# Patient Record
Sex: Male | Born: 1945 | Race: White | Hispanic: No | Marital: Married | Smoking: Former smoker
Health system: Southern US, Community
[De-identification: ages and names within clinical notes are randomized; demographics above are authoritative.]

## PROBLEM LIST (undated history)

## (undated) DIAGNOSIS — I10 Essential (primary) hypertension: Secondary | ICD-10-CM

## (undated) DIAGNOSIS — M545 Low back pain, unspecified: Secondary | ICD-10-CM

## (undated) DIAGNOSIS — R079 Chest pain, unspecified: Secondary | ICD-10-CM

## (undated) DIAGNOSIS — E039 Hypothyroidism, unspecified: Secondary | ICD-10-CM

## (undated) DIAGNOSIS — Z87442 Personal history of urinary calculi: Secondary | ICD-10-CM

## (undated) DIAGNOSIS — I639 Cerebral infarction, unspecified: Secondary | ICD-10-CM

## (undated) DIAGNOSIS — E785 Hyperlipidemia, unspecified: Secondary | ICD-10-CM

## (undated) DIAGNOSIS — F329 Major depressive disorder, single episode, unspecified: Secondary | ICD-10-CM

## (undated) DIAGNOSIS — G8929 Other chronic pain: Secondary | ICD-10-CM

## (undated) DIAGNOSIS — F32A Depression, unspecified: Secondary | ICD-10-CM

## (undated) DIAGNOSIS — I2510 Atherosclerotic heart disease of native coronary artery without angina pectoris: Secondary | ICD-10-CM

## (undated) DIAGNOSIS — N200 Calculus of kidney: Secondary | ICD-10-CM

## (undated) DIAGNOSIS — I209 Angina pectoris, unspecified: Secondary | ICD-10-CM

## (undated) DIAGNOSIS — E119 Type 2 diabetes mellitus without complications: Secondary | ICD-10-CM

## (undated) DIAGNOSIS — M1990 Unspecified osteoarthritis, unspecified site: Secondary | ICD-10-CM

## (undated) HISTORY — DX: Angina pectoris, unspecified: I20.9

## (undated) HISTORY — PX: OTHER SURGICAL HISTORY: SHX169

## (undated) HISTORY — PX: HAND SURGERY: SHX662

## (undated) HISTORY — DX: Chest pain, unspecified: R07.9

## (undated) HISTORY — PX: SKIN CANCER EXCISION: SHX779

---

## 1999-07-15 HISTORY — PX: DUPUYTREN CONTRACTURE RELEASE: SHX1478

## 2004-02-27 ENCOUNTER — Emergency Department (HOSPITAL_COMMUNITY): Admission: EM | Admit: 2004-02-27 | Discharge: 2004-02-28 | Payer: Self-pay | Admitting: Emergency Medicine

## 2005-01-15 ENCOUNTER — Emergency Department (HOSPITAL_COMMUNITY): Admission: EM | Admit: 2005-01-15 | Discharge: 2005-01-15 | Payer: Self-pay | Admitting: *Deleted

## 2007-03-26 ENCOUNTER — Ambulatory Visit: Payer: Self-pay | Admitting: Internal Medicine

## 2007-03-26 ENCOUNTER — Encounter (INDEPENDENT_AMBULATORY_CARE_PROVIDER_SITE_OTHER): Payer: Self-pay | Admitting: Specialist

## 2007-03-26 ENCOUNTER — Ambulatory Visit (HOSPITAL_COMMUNITY): Admission: RE | Admit: 2007-03-26 | Discharge: 2007-03-26 | Payer: Self-pay | Admitting: Internal Medicine

## 2007-10-02 ENCOUNTER — Ambulatory Visit (HOSPITAL_BASED_OUTPATIENT_CLINIC_OR_DEPARTMENT_OTHER): Admission: RE | Admit: 2007-10-02 | Discharge: 2007-10-02 | Payer: Self-pay | Admitting: Orthopedic Surgery

## 2007-10-02 ENCOUNTER — Encounter (INDEPENDENT_AMBULATORY_CARE_PROVIDER_SITE_OTHER): Payer: Self-pay | Admitting: Orthopedic Surgery

## 2008-10-13 ENCOUNTER — Encounter (INDEPENDENT_AMBULATORY_CARE_PROVIDER_SITE_OTHER): Payer: Self-pay | Admitting: Orthopedic Surgery

## 2008-10-13 ENCOUNTER — Ambulatory Visit (HOSPITAL_BASED_OUTPATIENT_CLINIC_OR_DEPARTMENT_OTHER): Admission: RE | Admit: 2008-10-13 | Discharge: 2008-10-13 | Payer: Self-pay | Admitting: Orthopedic Surgery

## 2010-12-13 ENCOUNTER — Emergency Department (HOSPITAL_COMMUNITY)
Admission: EM | Admit: 2010-12-13 | Discharge: 2010-12-13 | Payer: Self-pay | Source: Home / Self Care | Admitting: Emergency Medicine

## 2010-12-13 LAB — BASIC METABOLIC PANEL
BUN: 10 mg/dL (ref 6–23)
CO2: 29 mEq/L (ref 19–32)
Chloride: 98 mEq/L (ref 96–112)
Creatinine, Ser: 0.99 mg/dL (ref 0.4–1.5)
GFR calc Af Amer: >60 mL/min (ref >60)
GFR calc non Af Amer: >60 mL/min (ref >60)

## 2010-12-13 LAB — HEPATIC FUNCTION PANEL
ALT: 17 U/L (ref 0–53)
AST: 20 U/L (ref 0–37)
Bilirubin, Direct: 0.2 mg/dL (ref 0.0–0.3)
Indirect Bilirubin: 0.5 mg/dL (ref 0.3–0.9)
Total Protein: 6.5 g/dL (ref 6.0–8.3)

## 2010-12-13 LAB — URINALYSIS, ROUTINE W REFLEX MICROSCOPIC
Leukocytes, UA: NEGATIVE
Nitrite: NEGATIVE
Specific Gravity, Urine: 1.025 (ref 1.005–1.030)
Urine Glucose, Fasting: >1000 mg/dL — AB
pH: 6.5 (ref 5.0–8.0)

## 2010-12-13 LAB — URINE MICROSCOPIC-ADD ON

## 2010-12-13 LAB — DIFFERENTIAL
Basophils Absolute: 0 10*3/uL (ref 0.0–0.1)
Basophils Relative: 0 % (ref 0–1)
Eosinophils Absolute: 0.5 10*3/uL (ref 0.0–0.7)
Monocytes Absolute: 0.8 10*3/uL (ref 0.1–1.0)
Neutro Abs: 8.7 10*3/uL — ABNORMAL HIGH (ref 1.7–7.7)

## 2010-12-13 LAB — CBC: Platelets: 196 10*3/uL (ref 150–400)

## 2010-12-14 ENCOUNTER — Ambulatory Visit: Admit: 2010-12-14 | Payer: Self-pay | Admitting: Internal Medicine

## 2010-12-14 ENCOUNTER — Encounter (INDEPENDENT_AMBULATORY_CARE_PROVIDER_SITE_OTHER): Payer: Self-pay | Admitting: Internal Medicine

## 2010-12-14 ENCOUNTER — Ambulatory Visit (HOSPITAL_COMMUNITY): Admission: RE | Admit: 2010-12-14 | Payer: MEDICARE | Source: Home / Self Care | Admitting: Internal Medicine

## 2010-12-20 ENCOUNTER — Other Ambulatory Visit (HOSPITAL_COMMUNITY): Payer: Self-pay | Admitting: Pulmonary Disease

## 2010-12-20 DIAGNOSIS — M545 Low back pain: Secondary | ICD-10-CM

## 2010-12-20 DIAGNOSIS — R93429 Abnormal radiologic findings on diagnostic imaging of unspecified kidney: Secondary | ICD-10-CM

## 2010-12-23 ENCOUNTER — Ambulatory Visit (HOSPITAL_COMMUNITY)
Admission: RE | Admit: 2010-12-23 | Discharge: 2010-12-23 | Disposition: A | Discharge status: Home / Self Care | Payer: MEDICARE | Source: Ambulatory Visit | Attending: Pulmonary Disease | Admitting: Pulmonary Disease

## 2010-12-23 DIAGNOSIS — N289 Disorder of kidney and ureter, unspecified: Secondary | ICD-10-CM | POA: Insufficient documentation

## 2010-12-23 DIAGNOSIS — M51379 Other intervertebral disc degeneration, lumbosacral region without mention of lumbar back pain or lower extremity pain: Secondary | ICD-10-CM | POA: Insufficient documentation

## 2010-12-23 DIAGNOSIS — Q619 Cystic kidney disease, unspecified: Secondary | ICD-10-CM | POA: Insufficient documentation

## 2010-12-23 DIAGNOSIS — K8020 Calculus of gallbladder without cholecystitis without obstruction: Secondary | ICD-10-CM | POA: Insufficient documentation

## 2010-12-23 DIAGNOSIS — M5126 Other intervertebral disc displacement, lumbar region: Secondary | ICD-10-CM | POA: Insufficient documentation

## 2010-12-23 DIAGNOSIS — M5137 Other intervertebral disc degeneration, lumbosacral region: Secondary | ICD-10-CM | POA: Insufficient documentation

## 2010-12-23 DIAGNOSIS — M545 Low back pain, unspecified: Secondary | ICD-10-CM | POA: Insufficient documentation

## 2010-12-23 DIAGNOSIS — R93429 Abnormal radiologic findings on diagnostic imaging of unspecified kidney: Secondary | ICD-10-CM

## 2010-12-23 MED ORDER — GADOBENATE DIMEGLUMINE 529 MG/ML IV SOLN
20.0000 mL | Freq: Once | INTRAVENOUS | Status: AC | PRN
  Administered 2010-12-23: 20 mL via INTRAVENOUS

## 2011-03-28 NOTE — Op Note (Signed)
NAMESHIVAN, HODES NO.:  [id number]   MEDICAL RECORD NO.:  [id number]          PATIENT TYPE:  AMB   LOCATION:  DSC                          FACILITY:  [facility]   PHYSICIAN:  Cindee Salt, M.D.       DATE OF BIRTH:  1946/01/25   DATE OF PROCEDURE:  10/02/2007  DATE OF DISCHARGE:                               OPERATIVE REPORT   ADDENDUM:  On reflection, I noted that I did not dictate the release of  the PIP joints to the middle, ring, and little fingers.  During the  operative procedure, prior to irrigation, the swallowtail ligaments were  incised after incision of the flexor sheath proximal to the A3 pulley.  This allowed of further extension but not complete extension of the PIP  joint of each of the fingers.  The ligaments proximally of the volar  plate were then released.  This allowed further extension of the finger  to approximately 15 degrees of full extension.  The accessory collateral  ligaments were released and this allowed complete extension of the PIP  joint of the middle, ring, and little fingers.  The remainder of the  dictation was as I dictated in the note.           ______________________________  Cindee Salt, M.D.     GK/MEDQ  D:  10/02/2007  T:  10/02/2007  Job:  [id number]

## 2011-03-28 NOTE — Op Note (Signed)
Hunter Sparks, Hunter Sparks              ACCOUNT NO.:  [id number]   MEDICAL RECORD NO.:  [id number]          PATIENT TYPE:  AMB   LOCATION:  DSC                          FACILITY:  [facility]   PHYSICIAN:  Cindee Salt, M.D.       DATE OF BIRTH:  [birth year]   DATE OF PROCEDURE:  10/02/2007  DATE OF DISCHARGE:                               OPERATIVE REPORT   PREOPERATIVE DIAGNOSIS:  Dupuytren's contracture, left thumb, index,  middle, ring and little fingers, with significant contractures PIP  joints, first web space.   POSTOPERATIVE DIAGNOSIS:  Dupuytren's contracture, left thumb, index,  middle, ring and little fingers, with significant contractures PIP  joints, first web space.   OPERATION:  Release/excision palmar fascia thumb, index, middle, ring,  and little fingers, first web space contracture release PIP joints,  middle ring and little fingers, with VY advancements.   ANESTHESIOLOGIST:  Bedelia Person, M.D.   ANESTHESIA:  Axillary block, general anesthesia.   In the preoperative area, the patient is seen, questions have been  encouraged and answered.  He is aware of risks and complications  including infection, recurrence of injury to arteries, nerves, tendons,  incomplete relief of symptoms, dystrophy, extension, stiffness of the  digits, loss of a finger.  He is desirous of proceeding to have this  done.  His questions have been encouraged and answered.  Antibiotic was  given.  The extremity was marked by both the patient and surgeon.   PROCEDURE:  The patient was brought to the operating room. After an  axillary block was given without difficulty, he was prepped and draped  using DuraPrep, supine position, left arm free.  LMA anesthesia was  given prior to the prep.  The limb was exsanguinated with an Esmarch  bandage, tourniquet placed high on the arm was inflated to 250 mmHg  after exsanguination only to the elbow.  The volar incisions were made  after marking these, these were for  VY advancements with the proximal  limb centered over the ring finger.  This was carried down through  subcutaneous tissue.  Bleeders were electrocauterized.  Neurovascular  structures were identified and protected.  The palmar fascia was lifted  off from the carpal retinaculum.  The superficial palmar arch digital  arteries and nerves were then identified.  These were traced distally.  The cord was followed out to the level of the middle phalanx, protecting  neurovascular bundles over their entire course.  This allowed this to be  excised en toto. Extensions to the right little and middle finger via  the natatory ligaments were also excised as were digital sheaths.   The little finger was attended to next.  A volar Brunner incision was  made.  This was connected beneath the skin elevating the flap.  Neurovascular structures identified.  The dissection carried distally.  The central cord was noted to proceed to the middle phalanx. On lifting  this, there was a secondary cord beneath the central cord coming from  the radial interosseous and proceeding to the level of the middle  phalanx.  This was removed.  A  third cord was present from the digiti  quinti.  This was removed along with the digital sheath protecting the  neurovascular structures.  This allowed the finger to be straightened  from approximately 70 degrees to approximately 30 degrees on both ring  and little fingers.  The middle finger was attended to next. The cord  was then elevated proximally.  A Brunner incision was made. A tunnel was  made beneath the skin protecting the neurovascular structures.  This was  then delivered distally and traced out to the level of the middle  phalanx.  Again, a secondary cord beneath the central cord was present  from the radial interosseous. This was excised.  This allowed extension  of the middle finger from 70 degrees to approximately 30 degrees of  flexion.  The cords were sent to  pathology.   A separate incision was then made on the index finger.  The cord was  then removed and sent to pathology.  A volar Brunner incision was made  over the thumb and carried down through subcutaneous tissue.  The cord  was removed from the thumb.  A transverse cord from the level of the  radial side of the metacarpal then proceeded across the palm into the  first web space and beneath the skin.  This was dissected free,  protecting neurovascular structures.  This was also sent to pathology.  The wounds were then copiously irrigated with saline.  The V's were  converted to Y's.  The area was then bathed in Celestone using 2 mL.  This was done after irrigation.  Vessel loop drains were placed to the  depths of each wound and brought out proximally.  The skin was then  loosely closed with interrupted 5-0 Vicryl Rapide sutures.  The  tourniquet deflated, all fingers immediately pinked.  A sterile  compressive dressing and splint was applied.  The patient tolerated the  procedure well. On deflation of the tourniquet, all fingers showed  normal circulation. Prior to closure, each neurovascular bundle was  explored over its entire length and found to be intact.  A sterile  compressive dressing and splint was applied.  The patient was taken to  the recovery room for observation in satisfactory condition.  He will be  discharged home to return to the [facility] [place] in one week  on Percocet.           ______________________________  Cindee Salt, M.D.     GK/MEDQ  D:  10/02/2007  T:  10/02/2007  Job:  [id number]

## 2011-03-28 NOTE — Op Note (Signed)
NAMEABNER, ARDIS NO.:  [id number]   MEDICAL RECORD NO.:  [id number]          PATIENT TYPE:  AMB   LOCATION:  DSC                          FACILITY:  [facility]   PHYSICIAN:  Cindee Salt, M.D.       DATE OF BIRTH:  09-Mar-1946   DATE OF PROCEDURE:  10/13/2008  DATE OF DISCHARGE:                               OPERATIVE REPORT   PREOPERATIVE DIAGNOSIS:  Dupuytren contracture, right index, middle,  ring, and little finger with proximal interphalangeal joint contractures  up to 85 degrees, little finger.   POSTOPERATIVE DIAGNOSIS:  Dupuytren contracture, right index, middle,  ring, and little finger with proximal interphalangeal joint contractures  up to 85 degrees, little finger.   OPERATION:  Excision palmar fascia with V-Y advancements, right index,  middle, ring, and little with release of proximal interphalangeal  joints, index and little, right hand.   SURGEON:  Cindee Salt, MD   ASSISTANT:  Carolyne Fiscal, RN   ANESTHESIA:  General.   ANESTHESIOLOGIST:  Burna Forts, MD   HISTORY:  The patient is a 65 year old male with a history of Dupuytren  contracture to both hands.  He has undergone release on his left side.  He is admitted now for release of his right.  He is aware of risks and  complications including infection; recurrence; injury to arteries,  nerves, and tendons; incomplete relief of symptoms; dystrophy;  stiffness; loss of mobility; loss of sensation.  He is desirous of  proceeding.  In the preoperative area, the patient is seen, the  extremity marked by both the patient and surgeon, antibiotic given.   PROCEDURE:  The patient was brought to the operating room where a  general anesthetic was carried out without difficulty under the  direction of Dr. Jacklynn Bue.  He was prepped using DuraPrep in supine  position with the right arm free.  A time-out was taken.  The limb was  exsanguinated with an Esmarch bandage.  Tourniquet placed high in the  arm  was inflated to 250 mmHg.  Volar Brunner incisions were marked out  on each finger.  Incisions were made, primarily starting on the little  finger proximally, carried distally.  The palmar fascia was localized at  the attachment to the carpal retinaculum.  This was followed distally.  Neurovascular structures were identified and protected to each of the  fingers.  The cord was then followed distally.  A cord from the abductor  digiti quinti was noted.  The flaps were created, taking care to protect  the neurovascular structures.  Natatory cords to the ring finger were  also identified.  The central cord was then removed to the level of the  middle phalanx.  No further extension of the PIP joint was noted.  The  flexor sheath was cut transversely.  This did not allow any significant  extension of his finger.  The PIP joint was then released, releasing the  swallowtails of the checkrein ligaments.  The accessory collateral  ligament and the joint came out fully straight.  The ring finger was  attended too.  Next, the cord  was identified proximally, traced distally  to the level of the proximal phalanx.  This was released allowing the  metacarpophalangeal joint to come fully straight.  Neurovascular  structures were protected throughout the procedure.  Natatory cord to  the middle finger was also released.  The middle finger was attended  too.  Next, the volar Brunner incision made, carried distally to the  level of the middle phalanx.  This allowed release of the entire finger  fully straining at the PIP joint without release of the joint.  The  natatory cord to the index was identified.  This was released from the  cord to the middle finger.  Lateral digital sheaths were removed  protecting neurovascular structures.  The index finger was approached  next through the volar Brunner incision.  This was based to the first  dorsal interosseous, which had a large cord.  The neurovascular   structures were identified and protected.  There was a transverse band  crossing over the central cord, this was released.  The PIP joint  remained flexed after removal of the cord out to the middle phalanx.  Taking care to protect the neurovascular structures, the PIP joint was  then released after transversely incising the flexor sheath and  releasing of the checkrein ligaments and accessory collateral.  The PIP  joint was fully straight.  The wounds were then copiously irrigated with  saline.  Vs converted to Ys on each of the limbs.  The wound was then  closed over drains with interrupted 5-0 Vicryl Rapide sutures.  The  tourniquet was deflated.  Bleeders were electrocauterized with bipolar  prior to closure.  All fingers immediately pinked.  A sterile  compressive dressing and splint was applied.  The patient tolerated the  procedure well and was taken to the recovery room for observation in  satisfactory condition.  He will be discharged home, to return to the  [facility] in 1 week, on Percocet.           ______________________________  Cindee Salt, M.D.     GK/MEDQ  D:  10/13/2008  T:  10/14/2008  Job:  [id number]   cc:   Ramon Dredge L. Juanetta Gosling, M.D.

## 2011-03-31 NOTE — Op Note (Signed)
Hunter Sparks, [place]              ACCOUNT NO.:  [id number]   MEDICAL RECORD NO.:  [id number]          PATIENT TYPE:  AMB   LOCATION:  DAY                           FACILITY:  [facility]   PHYSICIAN:  Lionel December, M.D.    DATE OF BIRTH:  [birth year]   DATE OF PROCEDURE:  03/26/2007  DATE OF DISCHARGE:                               OPERATIVE REPORT   PROCEDURE:  Colonoscopy with polypectomy.   INDICATIONS:  Bricen is a 65 year old Caucasian male who is here for  surveillance examination.  His last exam was in March 1997 with removal  of four polyps, however, three were hyperplastic and one was a small  tubular adenoma.  His brother was recently diagnosed with colon  carcinoma at age 11.  Procedure risks were reviewed the patient and  informed consent was obtained.   MEDS FOR CONSCIOUS SEDATION:  Demerol 50 mg IV, Versed 5 mg IV in  divided dose.   FINDINGS:  Procedure performed in endoscopy suite.  The patient's vital  signs and O2 sat were monitored during the procedure and remained  stable.  The patient was placed in left lateral position.  Rectal  examination performed.  He had soft sentinel skin tags.  Digital exam  was normal.  The Pentax videoscope was placed in the rectum and advanced  under vision into sigmoid colon beyond.  He had diffuse pigmentation  consistent with melanosis coli.  He had few tiny diverticula at sigmoid  colon.  Scope was passed into cecum which was identified by appendiceal  orifice and ileocecal valve.  This area was normal.  As the scope was  withdrawn colonic mucosa was carefully examined.  There was 6 mm polyp  at the ascending colon which was snared and most of the polyp during  this process was coagulated, but part of it was obtained for histologic  diagnosis.  There was another 3 mm polyp in this area which was  coagulated using snare.  Another 6 mm polyp was snared from sigmoid  colon.  Another smaller polyp in the vicinity was coagulated.  There  was  a 10 mm sessile bilobed polyp at rectum which was difficult to see in  spite of turning the patient on his back and on the right side.  This  polyp was snared.  Polypectomy was felt to be complete.  Scope was  retroflexed to examine anorectal junction which was unremarkable.  Endoscope was straightened and withdrawn.  The patient tolerated the  procedure well.   FINAL DIAGNOSIS:  1. Examination performed to cecum.  2. Three polyps snared; one 6 mm polyp from the ascending colon,      second same size from sigmoid colon and a 10 mm sessile polyp from      rectum.  3. Two small polyps were coagulated; one at sigmoid colon, another one      at the ascending colon.  4. Melanosis coli.  5. Few diverticula at sigmoid colon.   RECOMMENDATIONS:  Standard instructions given.  High-fiber diet.  I will  be contacting patient results of biopsy and further recommendations.  Lionel December, M.D.  Electronically Signed     NR/MEDQ  D:  03/26/2007  T:  03/26/2007  Job:  [id number]   cc:   Dr. Juanetta Gosling

## 2011-03-31 NOTE — Consult Note (Signed)
Hunter Sparks, Hunter Sparks NO.:  [id number]   MEDICAL RECORD NO.:  [id number]          PATIENT TYPE:  EMS   LOCATION:  ED                            FACILITY:  [facility]   PHYSICIAN:  Jefry H. Pollyann Kennedy, MD     DATE OF BIRTH:  October 26, 1946   DATE OF CONSULTATION:  DATE OF DISCHARGE:  01/15/2005                                   CONSULTATION   TIME:  0400.   REASON FOR CONSULTATION:  Possible peritonsillar abscess.   HISTORY:  This is a 65 year old gentleman who was transferred down from  [facility] for evaluation of sore throat and possible peritonsillar  abscess.  He has had about a one-week history of fever and several-day  history of severe right-sided sore throat.  He has some sort of antibiotic  at home from a previous condition that he started taking a couple of days  ago without relief.  He was evaluated this morning at [facility],  and a Strep test was performed, but I do not have the results.  His right  side has been quite sore.   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   MEDICATIONS:  None.   HABITS:  He quit smoking four years ago.   PRIMARY CARE PHYSICIAN:  Edward L. Juanetta Gosling, M.D.   PHYSICAL EXAMINATION:  GENERAL:  He is a healthy-appearing gentleman with  normal-sounding voice.  There is no respiratory distress.  NECK:  There is no palpable adenopathy in the neck.  Nasal exam is clear.  Oral cavity and pharynx are negative for trismus.  There is inflammation of  the tonsils, asymmetric, worse on the right.  There is slight fullness of  the soft palate on the right side, but no significant edema or erythema.   IMPRESSION:  Acute tonsillitis, possible peritonsillar abscess.   PLAN:  Attempt aspiration and drainage.   PROCEDURE:  The right soft palate was infiltrated with 1% Xylocaine with  epinephrine in multiple spots.  An 18-gauge needle was used to aspirate from  the  peritonsillar space on the right side.  Several spots were aspirated,  but there was no purulence obtained.   Acute tonsillitis, negative for peritonsillar abscess.  The plan was to  treat with antibiotics.  He is allergic to penicillin.  It causes a rash.  We will give him a dose of Levaquin here in the emergency department and  send him home with a prescription to follow up in two days in our office  either in [place] or in [place].  He is also instructed to drink lots  of fluids, take over-the-counter analgesics as needed.      JHR/MEDQ  D:  01/15/2005  T:  01/15/2005  Job:  [id number]   cc:   Ramon Dredge L. Juanetta Gosling, M.D.  [address]  [place]  [place] [address]  Fax: [phone]

## 2011-05-24 ENCOUNTER — Encounter (INDEPENDENT_AMBULATORY_CARE_PROVIDER_SITE_OTHER): Payer: MEDICARE | Admitting: Internal Medicine

## 2011-06-21 ENCOUNTER — Ambulatory Visit (HOSPITAL_COMMUNITY)
Admission: RE | Admit: 2011-06-21 | Discharge: 2011-06-21 | Disposition: A | Discharge status: Home / Self Care | Payer: Medicare Other | Source: Ambulatory Visit | Attending: Internal Medicine | Admitting: Internal Medicine

## 2011-06-21 ENCOUNTER — Encounter (INDEPENDENT_AMBULATORY_CARE_PROVIDER_SITE_OTHER): Payer: MEDICARE | Admitting: Internal Medicine

## 2011-06-21 ENCOUNTER — Other Ambulatory Visit (INDEPENDENT_AMBULATORY_CARE_PROVIDER_SITE_OTHER): Payer: Self-pay | Admitting: Internal Medicine

## 2011-06-21 ENCOUNTER — Encounter (HOSPITAL_COMMUNITY)
Admission: RE | Disposition: A | Discharge status: Home / Self Care | Payer: Self-pay | Source: Ambulatory Visit | Attending: Internal Medicine

## 2011-06-21 ENCOUNTER — Encounter (HOSPITAL_COMMUNITY): Payer: Self-pay | Admitting: *Deleted

## 2011-06-21 DIAGNOSIS — Z01812 Encounter for preprocedural laboratory examination: Secondary | ICD-10-CM | POA: Insufficient documentation

## 2011-06-21 DIAGNOSIS — Z860100 Personal history of colon polyps, unspecified: Secondary | ICD-10-CM | POA: Insufficient documentation

## 2011-06-21 DIAGNOSIS — Z09 Encounter for follow-up examination after completed treatment for conditions other than malignant neoplasm: Secondary | ICD-10-CM | POA: Insufficient documentation

## 2011-06-21 DIAGNOSIS — K5730 Diverticulosis of large intestine without perforation or abscess without bleeding: Secondary | ICD-10-CM

## 2011-06-21 DIAGNOSIS — D126 Benign neoplasm of colon, unspecified: Secondary | ICD-10-CM | POA: Insufficient documentation

## 2011-06-21 DIAGNOSIS — Z800 Family history of malignant neoplasm of digestive organs: Secondary | ICD-10-CM

## 2011-06-21 DIAGNOSIS — K644 Residual hemorrhoidal skin tags: Secondary | ICD-10-CM

## 2011-06-21 DIAGNOSIS — E119 Type 2 diabetes mellitus without complications: Secondary | ICD-10-CM | POA: Insufficient documentation

## 2011-06-21 HISTORY — PX: COLONOSCOPY: SHX5424

## 2011-06-21 HISTORY — DX: Depression, unspecified: F32.A

## 2011-06-21 HISTORY — DX: Major depressive disorder, single episode, unspecified: F32.9

## 2011-06-21 HISTORY — DX: Unspecified osteoarthritis, unspecified site: M19.90

## 2011-06-21 SURGERY — COLONOSCOPY
Anesthesia: Moderate Sedation

## 2011-06-21 MED ORDER — MEPERIDINE HCL 50 MG/ML IJ SOLN
INTRAMUSCULAR | Status: AC
  Filled 2011-06-21: qty 1

## 2011-06-21 MED ORDER — SODIUM CHLORIDE 0.45 % IV SOLN
Freq: Once | INTRAVENOUS | Status: AC
  Administered 2011-06-21: 09:00:00 via INTRAVENOUS

## 2011-06-21 MED ORDER — MIDAZOLAM HCL 5 MG/5ML IJ SOLN
INTRAMUSCULAR | Status: AC
  Filled 2011-06-21: qty 10

## 2011-06-21 MED ORDER — MEPERIDINE HCL 50 MG/ML IJ SOLN
INTRAMUSCULAR | Status: DC | PRN
  Administered 2011-06-21 (×2): 25 mg via INTRAVENOUS

## 2011-06-21 MED ORDER — MIDAZOLAM HCL 5 MG/5ML IJ SOLN
INTRAMUSCULAR | Status: DC | PRN
  Administered 2011-06-21 (×2): 2 mg via INTRAVENOUS

## 2011-06-21 NOTE — Op Note (Signed)
COLONOSCOPY PROCEDURE REPORT  PATIENT:  Hunter Sparks  MR#:  [id number] Birthdate:  [birth year], 65 y.o., male Endoscopist:  Dr. Malissa Hippo, MD Referred By:  Dr. Fredirick Maudlin. Procedure Date: 06/21/2011  Procedure:   Colonoscopy  Indications:  History of colonic polyps; he had 2 tubular adenomas, one tubulovillous adenoma snared in May 2008 2 smaller polyps were coagulated. Family history is negative for CRC.  Informed Consent:  Procedure and risks were reviewed with  the patient.  Questions have been answered and informed consent obtained  Medications:  Demerol 50 mg IV Versed 4 mg IV  Description of procedure:  After a digital rectal exam was performed, that colonoscope was advanced from the anus through the rectum and colon to the area of the cecum, ileocecal valve and appendiceal orifice. The cecum was deeply intubated. These structures were well-seen and photographed for the record. From the level of the cecum and ileocecal valve, the scope was slowly and cautiously withdrawn. The mucosal surfaces were carefully surveyed utilizing scope tip to flexion to facilitate fold flattening as needed. The scope was pulled down into the rectum where a thorough exam including retroflexion was performed.  Findings:   Prep excellent. Few scattered diverticula in sigmoid colon and mild pigmentation involving ignored and descending colon. 2 small polyps ablated via cold biopsy from distal transverse colon and submitted in one container. One polyp had erosion on the surface. 2 small polyps ablated via cold biopsy from the splenic  flexure and submitted in one container  Therapeutic/Diagnostic Maneuvers Performed:  See above  Complications:  None  Cecal Withdrawal Time:  14 minutes  Impression:  4 small polyps ablated via cold biopsy; 2 from distal transverse colon and 2 from splenic flexure. few diverticula at sigmoid colon. Mild changes of melanosis coli involving distal half of the  colon.  Recommendations:  Resume usual medications. High fiber diet. No driving for the next 82-NFAOZ. Physician will contact you with biopsy results.  REHMAN,NAJEEB U  06/21/2011 10:05 AM  CC: Dr. Fredirick Maudlin, MD

## 2011-06-21 NOTE — H&P (Signed)
Hunter Sparks is an 65 y.o. male.   Chief Complaint: History of colonic polyps. For colonoscopy HPI: Patient is 65 year old Caucasian male whose last colonoscopy was in May 2008 with removal of 2 adenomas and one tubulovillous adenoma. 2 smaller polyps were coagulated. He is here for surveillance colonoscopy. He denies abdominal pain rectal bleeding or change in his bowel habits. Family history is negative for CRC.  Past Medical History  Diagnosis Date  . Diabetes mellitus   . Anxiety   . Depression   . Arthritis     Past Surgical History  Procedure Date  . Hand surgery     bilateral    History reviewed. No pertinent family history. Social History:  reports that he quit smoking about 8 years ago. He does not have any smokeless tobacco history on file. He reports that he does not drink alcohol or use illicit drugs.  Allergies:  Allergies  Allergen Reactions  . Aspirin Itching  . Penicillins Rash    Medications Prior to Admission  Medication Dose Route Frequency Provider Last Rate Last Dose  . 0.45 % sodium chloride infusion   Intravenous Once Malissa Hippo, MD 20 mL/hr at 06/21/11 0859    . meperidine (DEMEROL) 50 MG/ML injection           . midazolam (VERSED) 5 MG/5ML injection            Medications Prior to Admission  Medication Sig Dispense Refill  . metFORMIN (GLUCOPHAGE) 500 MG tablet Take 500 mg by mouth 2 (two) times daily with a meal.        . traMADol (ULTRAM-ER) 100 MG 24 hr tablet Take 100 mg by mouth daily.          Results for orders placed during the hospital encounter of 06/21/11 (from the past 48 hour(s))  GLUCOSE, CAPILLARY     Status: Abnormal   Collection Time   06/21/11  8:37 AM      Component Value Range Comment   Glucose-Capillary 148 (*) 70 - 99 (mg/dL)    No results found.  Review of Systems  Constitutional: Negative for malaise/fatigue.  Gastrointestinal: Negative for heartburn, nausea, vomiting, abdominal pain, diarrhea, constipation,  blood in stool and melena.    Blood pressure 144/78, pulse 67, temperature 97.8 F (36.6 C), resp. rate 18, height 6' (1.829 m), weight 185 lb (83.915 kg), SpO2 95.00%. Physical Exam  Constitutional: He appears well-developed and well-nourished.  HENT:  Mouth/Throat: Oropharynx is clear and moist.  Eyes: Conjunctivae are normal. No scleral icterus.  Neck: Neck supple. No thyromegaly present.  Cardiovascular: Normal rate, regular rhythm and normal heart sounds.   No murmur heard. Respiratory: Breath sounds normal.  GI: Soft. He exhibits no distension and no mass. There is no tenderness. There is no rebound.  Musculoskeletal: He exhibits edema.  Lymphadenopathy:    He has no cervical adenopathy.  Neurological: He is alert.  Skin: Skin is warm and dry.     Assessment/Plan History of colonic polyps. for surveillance colonoscopy. Seizure and was reviewed with the patient and informed consent was obtained.  Hunter Sparks U 06/21/2011, 9:35 AM

## 2011-06-27 ENCOUNTER — Encounter (INDEPENDENT_AMBULATORY_CARE_PROVIDER_SITE_OTHER): Payer: Self-pay | Admitting: *Deleted

## 2011-06-28 ENCOUNTER — Encounter (HOSPITAL_COMMUNITY): Payer: Self-pay | Admitting: Internal Medicine

## 2011-08-15 LAB — BASIC METABOLIC PANEL
BUN: 8 mg/dL (ref 6–23)
CO2: 32 mEq/L (ref 19–32)
GFR calc non Af Amer: >60 mL/min (ref >60)
Glucose, Bld: 173 mg/dL — ABNORMAL HIGH (ref 70–99)
Potassium: 4.3 mEq/L (ref 3.5–5.1)
Sodium: 143 mEq/L (ref 135–145)

## 2011-08-18 LAB — GLUCOSE, CAPILLARY: Glucose-Capillary: 223 mg/dL — ABNORMAL HIGH (ref 70–99)

## 2011-08-22 LAB — BASIC METABOLIC PANEL
CO2: 30
Chloride: 100
Creatinine, Ser: 1.05
GFR calc Af Amer: >60
Potassium: 4.4

## 2011-08-22 LAB — POCT HEMOGLOBIN-HEMACUE
Hemoglobin: 15.4
Operator id: 112821

## 2012-12-12 ENCOUNTER — Emergency Department (HOSPITAL_COMMUNITY)
Admission: EM | Admit: 2012-12-12 | Discharge: 2012-12-12 | Disposition: A | Discharge status: Home / Self Care | Payer: Medicare Other | Attending: Emergency Medicine | Admitting: Emergency Medicine

## 2012-12-12 ENCOUNTER — Emergency Department (HOSPITAL_COMMUNITY): Payer: Medicare Other

## 2012-12-12 ENCOUNTER — Encounter (HOSPITAL_COMMUNITY): Payer: Self-pay | Admitting: *Deleted

## 2012-12-12 DIAGNOSIS — Z87891 Personal history of nicotine dependence: Secondary | ICD-10-CM | POA: Insufficient documentation

## 2012-12-12 DIAGNOSIS — R110 Nausea: Secondary | ICD-10-CM | POA: Insufficient documentation

## 2012-12-12 DIAGNOSIS — F411 Generalized anxiety disorder: Secondary | ICD-10-CM | POA: Insufficient documentation

## 2012-12-12 DIAGNOSIS — F329 Major depressive disorder, single episode, unspecified: Secondary | ICD-10-CM | POA: Insufficient documentation

## 2012-12-12 DIAGNOSIS — M129 Arthropathy, unspecified: Secondary | ICD-10-CM | POA: Insufficient documentation

## 2012-12-12 DIAGNOSIS — Z79899 Other long term (current) drug therapy: Secondary | ICD-10-CM | POA: Insufficient documentation

## 2012-12-12 DIAGNOSIS — F3289 Other specified depressive episodes: Secondary | ICD-10-CM | POA: Insufficient documentation

## 2012-12-12 DIAGNOSIS — N23 Unspecified renal colic: Secondary | ICD-10-CM

## 2012-12-12 DIAGNOSIS — Z87442 Personal history of urinary calculi: Secondary | ICD-10-CM | POA: Insufficient documentation

## 2012-12-12 DIAGNOSIS — E119 Type 2 diabetes mellitus without complications: Secondary | ICD-10-CM | POA: Insufficient documentation

## 2012-12-12 DIAGNOSIS — IMO0002 Reserved for concepts with insufficient information to code with codable children: Secondary | ICD-10-CM | POA: Insufficient documentation

## 2012-12-12 HISTORY — DX: Calculus of kidney: N20.0

## 2012-12-12 LAB — URINE MICROSCOPIC-ADD ON

## 2012-12-12 LAB — URINALYSIS, ROUTINE W REFLEX MICROSCOPIC
Bilirubin Urine: NEGATIVE
Glucose, UA: NEGATIVE mg/dL
Ketones, ur: NEGATIVE mg/dL
Leukocytes, UA: NEGATIVE
Nitrite: NEGATIVE
Specific Gravity, Urine: >1.03 — ABNORMAL HIGH (ref 1.005–1.030)
Urobilinogen, UA: 0.2 mg/dL (ref 0.0–1.0)
pH: 5.5 (ref 5.0–8.0)

## 2012-12-12 MED ORDER — KETOROLAC TROMETHAMINE 30 MG/ML IJ SOLN
INTRAMUSCULAR | Status: AC
  Administered 2012-12-12: 15 mg via INTRAVENOUS
  Filled 2012-12-12: qty 1

## 2012-12-12 MED ORDER — SODIUM CHLORIDE 0.9 % IV SOLN
INTRAVENOUS | Status: DC
  Administered 2012-12-12: 03:00:00 via INTRAVENOUS

## 2012-12-12 MED ORDER — HYDROMORPHONE HCL 4 MG PO TABS
2.0000 mg | ORAL_TABLET | ORAL | Status: DC | PRN
Start: 2012-12-12 — End: 2015-06-19

## 2012-12-12 MED ORDER — HYDROMORPHONE HCL PF 1 MG/ML IJ SOLN
1.0000 mg | Freq: Once | INTRAMUSCULAR | Status: AC
  Administered 2012-12-12: 1 mg via INTRAVENOUS
  Filled 2012-12-12: qty 1

## 2012-12-12 MED ORDER — TAMSULOSIN HCL 0.4 MG PO CAPS
ORAL_CAPSULE | ORAL | Status: AC
  Filled 2012-12-12: qty 1

## 2012-12-12 MED ORDER — ONDANSETRON HCL 4 MG/2ML IJ SOLN
4.0000 mg | Freq: Once | INTRAMUSCULAR | Status: AC
  Administered 2012-12-12: 4 mg via INTRAVENOUS
  Filled 2012-12-12: qty 2

## 2012-12-12 MED ORDER — KETOROLAC TROMETHAMINE 15 MG/ML IJ SOLN
15.0000 mg | Freq: Once | INTRAMUSCULAR | Status: DC
  Filled 2012-12-12: qty 1

## 2012-12-12 MED ORDER — TAMSULOSIN HCL 0.4 MG PO CAPS: ORAL_CAPSULE | ORAL | Status: DC

## 2012-12-12 MED ORDER — TAMSULOSIN HCL 0.4 MG PO CAPS
0.4000 mg | ORAL_CAPSULE | Freq: Once | ORAL | Status: AC
  Administered 2012-12-12: 0.4 mg via ORAL
  Filled 2012-12-12: qty 1

## 2012-12-12 MED ORDER — NAPROXEN SODIUM 220 MG PO TABS: ORAL_TABLET | ORAL | Status: DC

## 2012-12-12 NOTE — ED Provider Notes (Signed)
History     CSN: [id number]  Arrival date & time 12/12/12  0301   First MD Initiated Contact with Patient 12/12/12 0309      Chief Complaint  Patient presents with  . Flank Pain    (Consider location/radiation/quality/duration/timing/severity/associated sxs/prior treatment) HPI Level V caveat: Severe pain. This is a 67 year old male who awoke just prior to arrival with severe pain in the right flank. The pain is like that of prior kidney stones. It is been associated with nausea but no vomiting. He is very agitated. He has been given nothing to treat the pain. His wife drove him over here immediately.  Past Medical History  Diagnosis Date  . Diabetes mellitus   . Anxiety   . Depression   . Arthritis     Past Surgical History  Procedure Date  . Hand surgery     bilateral  . Colonoscopy 06/21/2011    Procedure: COLONOSCOPY;  Surgeon: Malissa Hippo, MD;  Location: [facility];  Service: Endoscopy;  Laterality: N/A;    No family history on file.  History  Substance Use Topics  . Smoking status: Former Smoker    Quit date: 06/21/2003  . Smokeless tobacco: Not on file  . Alcohol Use: No      Review of Systems  Unable to perform ROS   Allergies  Aspirin and Penicillins  Home Medications   Current Outpatient Rx  Name  Route  Sig  Dispense  Refill  . LEVOTHYROXINE SODIUM 75 MCG PO TABS   Oral   Take 75 mcg by mouth daily.         Marland Kitchen METFORMIN HCL 500 MG PO TABS   Oral   Take 500 mg by mouth 2 (two) times daily with a meal.           . TRAMADOL HCL ER 100 MG PO TB24   Oral   Take 100 mg by mouth daily.             BP 166/81  Pulse 68  Temp 97.9 F (36.6 C) (Oral)  Resp 20  Ht 6' (1.829 m)  Wt 195 lb (88.451 kg)  BMI 26.45 kg/m2  SpO2 100%  Physical Exam General: Well-developed, well-nourished male in obvious discomfort; appearance consistent with age of record HENT: normocephalic, atraumatic Eyes: pupils equal round and reactive to  light; extraocular muscles intact Neck: supple Heart: regular rate and rhythm Lungs: clear to auscultation bilaterally Abdomen: soft; nondistended; nontender GU: Right CVA tenderness Extremities: No deformity; full range of motion Neurologic: Awake, alert; motor function intact in all extremities and symmetric; no facial droop Skin: Warm and dry Psychiatric: agitated     ED Course  Procedures (including critical care time)   MDM   Nursing notes and vitals signs, including pulse oximetry, reviewed.  Summary of this visit's results, reviewed by myself:  Labs:  Results for orders placed during the hospital encounter of 12/12/12 (from the past 24 hour(s))  URINALYSIS, ROUTINE W REFLEX MICROSCOPIC     Status: Abnormal   Collection Time   12/12/12  4:10 AM      Component Value Range   Color, Urine YELLOW  YELLOW   APPearance CLEAR  CLEAR   Specific Gravity, Urine >1.030 (*) 1.005 - 1.030   pH 5.5  5.0 - 8.0   Glucose, UA NEGATIVE  NEGATIVE mg/dL   Hgb urine dipstick LARGE (*) NEGATIVE   Bilirubin Urine NEGATIVE  NEGATIVE   Ketones, ur NEGATIVE  NEGATIVE  mg/dL   Protein, ur TRACE (*) NEGATIVE mg/dL   Urobilinogen, UA 0.2  0.0 - 1.0 mg/dL   Nitrite NEGATIVE  NEGATIVE   Leukocytes, UA NEGATIVE  NEGATIVE  URINE MICROSCOPIC-ADD ON     Status: Abnormal   Collection Time   12/12/12  4:10 AM      Component Value Range   Squamous Epithelial / LPF RARE  RARE   WBC, UA 0-2  <3 WBC/hpf   RBC / HPF TOO NUMEROUS TO COUNT  <3 RBC/hpf   Bacteria, UA FEW (*) RARE    Imaging Studies: Ct Abdomen Pelvis Wo Contrast  12/12/2012  *RADIOLOGY REPORT*  Clinical Data: Right flank pain  CT ABDOMEN AND PELVIS WITHOUT CONTRAST  Technique:  Multidetector CT imaging of the abdomen and pelvis was performed following the standard protocol without intravenous contrast.  Comparison: 12/13/2010 CT, 12/23/2010 MRI  Findings: Linear opacity within the middle lobe and lingula, unchanged, most in keeping with  scarring.  Normal heart size.  Organ abnormality/lesion detection is limited in the absence of intravenous contrast. Within this limitation, unremarkable liver, spleen, pancreas.  Gallstones.  No biliary ductal dilatation.  Bilateral adrenal nodularity without a dominant/measurable nodule, similar to prior.  Mild right hydroureteronephrosis to the level of a 2 mm right UVJ stone. There may be a tiny nonobstructing upper pole stone on the right.  The previously noted hemorrhagic cyst on the right now appears water attenuation.  The hemorrhagic cyst on the left is no longer visualized.  Colonic diverticulosis.  No CT evidence for colitis or diverticulitis.  Normal appendix.  No bowel obstruction.  No free intraperitoneal air or fluid.  Fat containing anterior abdominal wall hernia, similar to prior, supraumbilical.  No lymphadenopathy.  Normal caliber aorta and branch vessels with scattered atherosclerotic disease.  Partially decompressed bladder.  Multilevel degenerative changes of the imaged spine. No acute or aggressive appearing osseous lesion. Mild T12 compression deformity is similar to prior.  Metallic clips within the right rectus abdominus musculature, unchanged.  IMPRESSION: Mild right hydroureteronephrosis to the level of a 2 mm right UVJ stone.  Gallstones.   Original Report Authenticated By: Jearld Lesch, M.D.       4:01 AM Pain significantly improved with IV medications. He has been unable to void. Flomax ordered.  4:46 AM Patient states he is ready to go home.         Hanley Seamen, MD 12/12/12 [id number]

## 2012-12-12 NOTE — ED Notes (Signed)
Pt reporting pain in right flank, as well as nausea and vomiting.  Reporting pain woke him up.  Reports history of kidney stone, and states pain feels the same.

## 2012-12-12 NOTE — ED Notes (Signed)
Unable to void at present - aware of need for urine specimen

## 2014-02-24 ENCOUNTER — Other Ambulatory Visit (HOSPITAL_COMMUNITY): Payer: Self-pay | Admitting: Interventional Radiology

## 2014-02-24 DIAGNOSIS — M549 Dorsalgia, unspecified: Secondary | ICD-10-CM

## 2014-02-25 ENCOUNTER — Ambulatory Visit (HOSPITAL_COMMUNITY)
Admission: RE | Admit: 2014-02-25 | Discharge: 2014-02-25 | Disposition: A | Discharge status: Home / Self Care | Payer: Medicare Other | Source: Ambulatory Visit | Attending: Interventional Radiology | Admitting: Interventional Radiology

## 2014-02-25 ENCOUNTER — Other Ambulatory Visit (HOSPITAL_COMMUNITY): Payer: Self-pay | Admitting: Interventional Radiology

## 2014-02-25 DIAGNOSIS — M545 Low back pain, unspecified: Secondary | ICD-10-CM

## 2014-02-25 DIAGNOSIS — M48061 Spinal stenosis, lumbar region without neurogenic claudication: Secondary | ICD-10-CM | POA: Insufficient documentation

## 2014-02-25 DIAGNOSIS — M549 Dorsalgia, unspecified: Secondary | ICD-10-CM

## 2014-02-26 ENCOUNTER — Telehealth (HOSPITAL_COMMUNITY): Payer: Self-pay | Admitting: Interventional Radiology

## 2014-02-26 NOTE — Telephone Encounter (Signed)
Called pt, spoke to pt's wife. Told her that Deveshwar said there were no fractures on the pt's MRI but that he may benefit from epidural injections. I spoke to Dr. Vernard Gambles and he told me to tell the patient to contact their office at [facility] to schedule injections. Pt's wife stated understanding and is in agreement with this plan of care. JM

## 2014-03-23 ENCOUNTER — Other Ambulatory Visit: Payer: Self-pay | Admitting: Pulmonary Disease

## 2014-03-23 DIAGNOSIS — M47817 Spondylosis without myelopathy or radiculopathy, lumbosacral region: Secondary | ICD-10-CM

## 2014-03-25 ENCOUNTER — Other Ambulatory Visit: Payer: Self-pay | Admitting: Pulmonary Disease

## 2014-03-25 ENCOUNTER — Ambulatory Visit
Admission: RE | Admit: 2014-03-25 | Discharge: 2014-03-25 | Disposition: A | Discharge status: Home / Self Care | Payer: Medicare Other | Source: Ambulatory Visit | Attending: Pulmonary Disease | Admitting: Pulmonary Disease

## 2014-03-25 VITALS — BP 160/87 | HR 70

## 2014-03-25 DIAGNOSIS — M47817 Spondylosis without myelopathy or radiculopathy, lumbosacral region: Secondary | ICD-10-CM

## 2014-03-25 MED ORDER — METHYLPREDNISOLONE ACETATE 40 MG/ML INJ SUSP (RADIOLOG: 120.0000 mg | Freq: Once | INTRAMUSCULAR | Status: AC

## 2014-03-25 MED ORDER — IOHEXOL 180 MG/ML  SOLN
1.0000 mL | Freq: Once | INTRAMUSCULAR | Status: AC | PRN
  Administered 2014-03-25: 1 mL via INTRA_ARTICULAR

## 2014-03-25 MED ORDER — METHYLPREDNISOLONE ACETATE 40 MG/ML INJ SUSP (RADIOLOG
80.0000 mg | Freq: Once | INTRAMUSCULAR | Status: AC
  Administered 2014-03-25: 80 mg via INTRA_ARTICULAR

## 2014-03-25 MED ORDER — IOHEXOL 180 MG/ML  SOLN: 1.0000 mL | Freq: Once | INTRAMUSCULAR | Status: AC | PRN

## 2014-03-25 NOTE — Discharge Instructions (Signed)

## 2014-07-29 ENCOUNTER — Other Ambulatory Visit: Payer: Self-pay | Admitting: Pulmonary Disease

## 2014-07-29 DIAGNOSIS — M47817 Spondylosis without myelopathy or radiculopathy, lumbosacral region: Secondary | ICD-10-CM

## 2014-08-03 ENCOUNTER — Ambulatory Visit
Admission: RE | Admit: 2014-08-03 | Discharge: 2014-08-03 | Disposition: A | Discharge status: Home / Self Care | Payer: Medicare Other | Source: Ambulatory Visit | Attending: Pulmonary Disease | Admitting: Pulmonary Disease

## 2014-08-03 ENCOUNTER — Other Ambulatory Visit: Payer: Self-pay | Admitting: Pulmonary Disease

## 2014-08-03 VITALS — BP 163/90 | HR 70

## 2014-08-03 DIAGNOSIS — M47817 Spondylosis without myelopathy or radiculopathy, lumbosacral region: Secondary | ICD-10-CM

## 2014-08-03 MED ORDER — IOHEXOL 180 MG/ML  SOLN
1.0000 mL | Freq: Once | INTRAMUSCULAR | Status: AC | PRN
  Administered 2014-08-03: 1 mL via INTRA_ARTICULAR

## 2014-08-03 MED ORDER — METHYLPREDNISOLONE ACETATE 40 MG/ML INJ SUSP (RADIOLOG
120.0000 mg | Freq: Once | INTRAMUSCULAR | Status: AC
  Administered 2014-08-03: 120 mg via INTRA_ARTICULAR

## 2014-12-04 ENCOUNTER — Ambulatory Visit: Payer: Medicare Other | Admitting: Nutrition

## 2014-12-28 ENCOUNTER — Encounter: Payer: Medicare Other | Admitting: Nutrition

## 2015-01-05 DIAGNOSIS — E782 Mixed hyperlipidemia: Secondary | ICD-10-CM | POA: Diagnosis not present

## 2015-01-05 DIAGNOSIS — E1165 Type 2 diabetes mellitus with hyperglycemia: Secondary | ICD-10-CM | POA: Diagnosis not present

## 2015-01-05 DIAGNOSIS — E559 Vitamin D deficiency, unspecified: Secondary | ICD-10-CM | POA: Diagnosis not present

## 2015-01-05 DIAGNOSIS — I10 Essential (primary) hypertension: Secondary | ICD-10-CM | POA: Diagnosis not present

## 2015-01-11 DIAGNOSIS — E1165 Type 2 diabetes mellitus with hyperglycemia: Secondary | ICD-10-CM | POA: Diagnosis not present

## 2015-01-11 DIAGNOSIS — E782 Mixed hyperlipidemia: Secondary | ICD-10-CM | POA: Diagnosis not present

## 2015-01-11 DIAGNOSIS — E559 Vitamin D deficiency, unspecified: Secondary | ICD-10-CM | POA: Diagnosis not present

## 2015-01-11 DIAGNOSIS — I10 Essential (primary) hypertension: Secondary | ICD-10-CM | POA: Diagnosis not present

## 2015-01-27 ENCOUNTER — Other Ambulatory Visit: Payer: Self-pay | Admitting: Pulmonary Disease

## 2015-01-27 DIAGNOSIS — M545 Low back pain: Secondary | ICD-10-CM

## 2015-01-29 ENCOUNTER — Ambulatory Visit
Admission: RE | Admit: 2015-01-29 | Discharge: 2015-01-29 | Disposition: A | Discharge status: Home / Self Care | Payer: Medicare Other | Source: Ambulatory Visit | Attending: Pulmonary Disease | Admitting: Pulmonary Disease

## 2015-01-29 ENCOUNTER — Other Ambulatory Visit: Payer: Self-pay | Admitting: Pulmonary Disease

## 2015-01-29 DIAGNOSIS — M545 Low back pain: Secondary | ICD-10-CM | POA: Diagnosis not present

## 2015-01-29 MED ORDER — IOHEXOL 180 MG/ML  SOLN
1.0000 mL | Freq: Once | INTRAMUSCULAR | Status: AC | PRN
  Administered 2015-01-29: 1 mL via EPIDURAL

## 2015-01-29 MED ORDER — METHYLPREDNISOLONE ACETATE 40 MG/ML INJ SUSP (RADIOLOG
120.0000 mg | Freq: Once | INTRAMUSCULAR | Status: AC
  Administered 2015-01-29: 120 mg via EPIDURAL

## 2015-01-29 NOTE — Discharge Instructions (Addendum)

## 2015-02-12 ENCOUNTER — Encounter: Payer: Self-pay | Admitting: Nutrition

## 2015-02-12 ENCOUNTER — Encounter: Payer: Medicare Other | Attending: "Endocrinology | Admitting: Nutrition

## 2015-02-12 VITALS — Ht 72.0 in | Wt 182.0 lb

## 2015-02-12 DIAGNOSIS — E118 Type 2 diabetes mellitus with unspecified complications: Secondary | ICD-10-CM | POA: Insufficient documentation

## 2015-02-12 DIAGNOSIS — Z713 Dietary counseling and surveillance: Secondary | ICD-10-CM | POA: Insufficient documentation

## 2015-02-12 DIAGNOSIS — IMO0002 Reserved for concepts with insufficient information to code with codable children: Secondary | ICD-10-CM

## 2015-02-12 DIAGNOSIS — E1165 Type 2 diabetes mellitus with hyperglycemia: Secondary | ICD-10-CM

## 2015-02-12 NOTE — Progress Notes (Signed)
  Medical Nutrition Therapy:  Appt start time: 0930 end time:  1030.  Assessment:  Primary concerns today: Diabetes. A1C 8.8%. He is here with his wife today. Has significant back problems. Injections aren't helping much he notes.  His wife  does the cooking and shopping. Eats a lot of fried foods from time to time.Skips lunch often. Doesn't drink a lot of water but willing to start drinking more. Unable to exercise much due to back problems. In a lot of pain today. Typically eats a little breakfast and a big supper, which is most likely the culprit for his A1C of 8.8%. Occasionally eats snacks and has sweets from time to time.   Diet is inconsistent in CHO and balanced meals for maximum DM control. Can't tolerate Metformin. Not on any DM meds at present.  Preferred Learning Style:   Auditory  Visual  Hands on  Learning Readiness:    Ready  Change in progress   MEDICATIONS: See list   DIETARY INTAKE:  24-hr recall:  B ( AM): Sausage, egg, toast and grils   Snk ( AM):   L ( PM): skips Snk ( PM): nabs or misc. D ( PM): Meat, vegetables. LIkes a lot of potatoes, corn, peas and beans. Unsweet tea Snk ( PM): popcorn Beverages: Water, Unsweet tea or diet soda   Usual physical activity: very limited due to back pain.  Estimated energy needs: 1800 calories 200 g carbohydrates 135 g protein 50 g fat  Progress Towards Goal(s):  In progress.   Nutritional Diagnosis:  NB-1.1 Food and nutrition-related knowledge deficit As related to Diabetes.  As evidenced by A1C 8.8%..    Intervention:  Nutrition Counseling and diabetes education done on diet, meal planning, disease, complications of DM, target ranges for blood sugars,  Plan: 1. Follow My Plate as instructed trying to get about 45 g CHO per meals. 2. Avoid skipping meals, especially lunch. 3. Increase water to 4-5 bottles per day. 4. Increase fresh fruits and low carb vegetables. 5. Try to get FBS less than 130 and after  meals less than 180 mg/dl. 6.Bring blood sugars log to next visit. 7. Get A1C down to 7% in three months.  Teaching Method Utilized:  Visual Auditory Hands on  Handouts given during visit include: The Plate Method Carb Counting and Food Label handouts Meal Plan Card  Barriers to learning/adherence to lifestyle change: none  Demonstrated degree of understanding via:  Teach Back   Monitoring/Evaluation:  Dietary intake, exercise, meal planning, SBG, and body weight in 1 month(s).

## 2015-02-12 NOTE — Patient Instructions (Signed)
Plan: 1. Follow My Plate as instructed trying to get about 45 g CHO per meals. 2. Avoid skipping meals, especially lunch. 3. Increase water to 4-5 bottles per day. 4. Increase fresh fruits and low carb vegetables. 5. Try to get FBS less than 130 and after meals less than 180 mg/dl. 6.Bring blood sugars log to next visit. 7. Get A1C down to 7% in three months.

## 2015-04-01 DIAGNOSIS — E119 Type 2 diabetes mellitus without complications: Secondary | ICD-10-CM | POA: Diagnosis not present

## 2015-04-01 DIAGNOSIS — M545 Low back pain: Secondary | ICD-10-CM | POA: Diagnosis not present

## 2015-04-09 DIAGNOSIS — E039 Hypothyroidism, unspecified: Secondary | ICD-10-CM | POA: Diagnosis not present

## 2015-04-09 DIAGNOSIS — E1165 Type 2 diabetes mellitus with hyperglycemia: Secondary | ICD-10-CM | POA: Diagnosis not present

## 2015-04-09 DIAGNOSIS — E782 Mixed hyperlipidemia: Secondary | ICD-10-CM | POA: Diagnosis not present

## 2015-04-09 DIAGNOSIS — I10 Essential (primary) hypertension: Secondary | ICD-10-CM | POA: Diagnosis not present

## 2015-04-19 ENCOUNTER — Encounter: Payer: Medicare Other | Attending: "Endocrinology | Admitting: Nutrition

## 2015-04-19 VITALS — Ht 72.0 in | Wt 187.0 lb

## 2015-04-19 DIAGNOSIS — E1165 Type 2 diabetes mellitus with hyperglycemia: Secondary | ICD-10-CM

## 2015-04-19 DIAGNOSIS — Z713 Dietary counseling and surveillance: Secondary | ICD-10-CM | POA: Diagnosis not present

## 2015-04-19 DIAGNOSIS — E118 Type 2 diabetes mellitus with unspecified complications: Secondary | ICD-10-CM | POA: Insufficient documentation

## 2015-04-19 DIAGNOSIS — IMO0002 Reserved for concepts with insufficient information to code with codable children: Secondary | ICD-10-CM

## 2015-04-19 DIAGNOSIS — I10 Essential (primary) hypertension: Secondary | ICD-10-CM | POA: Diagnosis not present

## 2015-04-19 DIAGNOSIS — E559 Vitamin D deficiency, unspecified: Secondary | ICD-10-CM | POA: Diagnosis not present

## 2015-04-19 DIAGNOSIS — E038 Other specified hypothyroidism: Secondary | ICD-10-CM | POA: Diagnosis not present

## 2015-04-19 DIAGNOSIS — E782 Mixed hyperlipidemia: Secondary | ICD-10-CM | POA: Diagnosis not present

## 2015-04-19 NOTE — Patient Instructions (Signed)
Plan: 1. Follow My Plate as instructed trying to get about 45 g CHO per meals. 2. Avoid skipping meals 3. Increase water to 4-5 bottles per day. 4. Increase low carb vegetables- 2 servings with lunch and dinner. 5. Try to get FBS less than 130 and after meals less than 180 mg/dl. 6.Bring blood sugars log to next visit. 7. Get A1C down to 7% in three months. 8. Take Janumet as prescribed.

## 2015-04-19 NOTE — Progress Notes (Signed)
  Medical Nutrition Therapy:  Appt start time: 1100 end time:  1130.  Assessment:  Primary concerns today: Diabetes follow up. A1C 8.2%. He is here with his wife. CHanges made: Trying to eat three meals per day. Still doesn't have an appetite at times from pain meds. Sometimes skips breakfast and only eats lunch and dinner or skips lunch from late breakfast.. Trying to walk some for exercise. Feels better. Trying to eat more fresh fruits and vegetables and whole grains. Most recent A1C 8.2% still. Hasn't changed since last visit. Has been eating more jelly with breakfast. Saw Dr. Dorris Fetch today. Going to start on Janumet to see if that will help improve his blood sugar control.   Diet is still excessive in CHO at some meals. Needs more low carb vegetables;2 per lunch and dinner and needs more consistency of eating three better balanced meals.  Preferred Learning Style:   Auditory  Visual  Hands on  Learning Readiness:    Ready  Change in progress  MEDICATIONS: See list   DIETARY INTAKE:  24-hr recall:  B ( AM): Sausage, egg, toast and grils   Snk ( AM):   L ( PM): PB and honey sandwich OR tunafish sandwich and crystal light Snk ( PM):  D ( PM): 2 hot dogs with baked beans, crystal light 8-9 tator tots Snk ( PM): none Beverages: Water, Unsweet tea or diet soda   Usual physical activity: very limited due to back pain.  Estimated energy needs: 1800 calories 200 g carbohydrates 135 g protein 50 g fat  Progress Towards Goal(s):  In progress.   Nutritional Diagnosis:  NB-1.1 Food and nutrition-related knowledge deficit As related to Diabetes.  As evidenced by A1C 8.8%..    Intervention:  Nutrition Counseling and diabetes education done on diet, meal planning, disease, complications of DM, target ranges for blood sugars. Reviewed meal planning and CHO counting and MY Plate.  Plan: 1. Follow My Plate as instructed trying to get about 45 g CHO per meals. 2. Avoid skipping  meals 3. Increase water to 4-5 bottles per day. 4. Increase low carb vegetables- 2 servings with lunch and dinner. 5. Try to get FBS less than 130 and after meals less than 180 mg/dl. 6.Bring blood sugars log to next visit. 7. Get A1C down to 7% in three months. 8. Take Janumet as prescribed.  Teaching Method Utilized:  Visual Auditory Hands on  Handouts given during visit include: The Plate Method Diabetes Instructions.  Barriers to learning/adherence to lifestyle change: none  Demonstrated degree of understanding via:  Teach Back   Monitoring/Evaluation:  Dietary intake, exercise, meal planning, SBG, and body weight in 3 month(s).

## 2015-05-10 ENCOUNTER — Other Ambulatory Visit: Payer: Self-pay

## 2015-05-14 ENCOUNTER — Other Ambulatory Visit: Payer: Self-pay | Admitting: Pulmonary Disease

## 2015-05-14 DIAGNOSIS — G8929 Other chronic pain: Secondary | ICD-10-CM

## 2015-05-14 DIAGNOSIS — M545 Low back pain: Principal | ICD-10-CM

## 2015-05-18 DIAGNOSIS — Z0000 Encounter for general adult medical examination without abnormal findings: Secondary | ICD-10-CM | POA: Diagnosis not present

## 2015-05-19 ENCOUNTER — Ambulatory Visit
Admission: RE | Admit: 2015-05-19 | Discharge: 2015-05-19 | Disposition: A | Discharge status: Home / Self Care | Payer: Medicare Other | Source: Ambulatory Visit | Attending: Pulmonary Disease | Admitting: Pulmonary Disease

## 2015-05-19 ENCOUNTER — Other Ambulatory Visit: Payer: Self-pay | Admitting: Pulmonary Disease

## 2015-05-19 VITALS — BP 151/72 | HR 80

## 2015-05-19 DIAGNOSIS — M545 Low back pain, unspecified: Secondary | ICD-10-CM

## 2015-05-19 DIAGNOSIS — G8929 Other chronic pain: Secondary | ICD-10-CM

## 2015-05-19 DIAGNOSIS — M5137 Other intervertebral disc degeneration, lumbosacral region: Secondary | ICD-10-CM

## 2015-05-19 MED ORDER — IOHEXOL 180 MG/ML  SOLN
1.0000 mL | Freq: Once | INTRAMUSCULAR | Status: AC | PRN
  Administered 2015-05-19: 1 mL via INTRA_ARTICULAR

## 2015-05-19 MED ORDER — IOHEXOL 180 MG/ML  SOLN: 1.0000 mL | Freq: Once | INTRAMUSCULAR | Status: DC | PRN

## 2015-05-19 MED ORDER — METHYLPREDNISOLONE ACETATE 40 MG/ML INJ SUSP (RADIOLOG
120.0000 mg | Freq: Once | INTRAMUSCULAR | Status: AC
  Administered 2015-05-19: 120 mg via INTRA_ARTICULAR

## 2015-05-19 MED ORDER — METHYLPREDNISOLONE ACETATE 40 MG/ML INJ SUSP (RADIOLOG: 120.0000 mg | Freq: Once | INTRAMUSCULAR | Status: DC

## 2015-06-01 DIAGNOSIS — E119 Type 2 diabetes mellitus without complications: Secondary | ICD-10-CM | POA: Diagnosis not present

## 2015-06-01 DIAGNOSIS — H52203 Unspecified astigmatism, bilateral: Secondary | ICD-10-CM | POA: Diagnosis not present

## 2015-06-01 DIAGNOSIS — H524 Presbyopia: Secondary | ICD-10-CM | POA: Diagnosis not present

## 2015-06-01 DIAGNOSIS — H53022 Refractive amblyopia, left eye: Secondary | ICD-10-CM | POA: Diagnosis not present

## 2015-06-10 ENCOUNTER — Inpatient Hospital Stay (HOSPITAL_COMMUNITY)
Admission: EM | Admit: 2015-06-10 | Discharge: 2015-06-19 | DRG: 234 | Disposition: A | Discharge status: Home Health | Payer: Medicare Other | Attending: Cardiothoracic Surgery | Admitting: Cardiothoracic Surgery

## 2015-06-10 ENCOUNTER — Encounter (HOSPITAL_COMMUNITY): Payer: Self-pay

## 2015-06-10 ENCOUNTER — Emergency Department (HOSPITAL_COMMUNITY): Payer: Medicare Other

## 2015-06-10 DIAGNOSIS — I2510 Atherosclerotic heart disease of native coronary artery without angina pectoris: Secondary | ICD-10-CM | POA: Diagnosis not present

## 2015-06-10 DIAGNOSIS — E782 Mixed hyperlipidemia: Secondary | ICD-10-CM | POA: Diagnosis present

## 2015-06-10 DIAGNOSIS — F419 Anxiety disorder, unspecified: Secondary | ICD-10-CM | POA: Diagnosis not present

## 2015-06-10 DIAGNOSIS — E039 Hypothyroidism, unspecified: Secondary | ICD-10-CM | POA: Diagnosis not present

## 2015-06-10 DIAGNOSIS — G8929 Other chronic pain: Secondary | ICD-10-CM | POA: Diagnosis not present

## 2015-06-10 DIAGNOSIS — R0602 Shortness of breath: Secondary | ICD-10-CM

## 2015-06-10 DIAGNOSIS — M545 Low back pain: Secondary | ICD-10-CM | POA: Diagnosis present

## 2015-06-10 DIAGNOSIS — I200 Unstable angina: Secondary | ICD-10-CM

## 2015-06-10 DIAGNOSIS — I10 Essential (primary) hypertension: Secondary | ICD-10-CM | POA: Diagnosis present

## 2015-06-10 DIAGNOSIS — I209 Angina pectoris, unspecified: Secondary | ICD-10-CM | POA: Diagnosis present

## 2015-06-10 DIAGNOSIS — E119 Type 2 diabetes mellitus without complications: Secondary | ICD-10-CM | POA: Diagnosis not present

## 2015-06-10 DIAGNOSIS — Z951 Presence of aortocoronary bypass graft: Secondary | ICD-10-CM

## 2015-06-10 DIAGNOSIS — M1990 Unspecified osteoarthritis, unspecified site: Secondary | ICD-10-CM | POA: Diagnosis not present

## 2015-06-10 DIAGNOSIS — Z87891 Personal history of nicotine dependence: Secondary | ICD-10-CM

## 2015-06-10 DIAGNOSIS — K5900 Constipation, unspecified: Secondary | ICD-10-CM | POA: Diagnosis not present

## 2015-06-10 DIAGNOSIS — E785 Hyperlipidemia, unspecified: Secondary | ICD-10-CM | POA: Diagnosis present

## 2015-06-10 DIAGNOSIS — D62 Acute posthemorrhagic anemia: Secondary | ICD-10-CM | POA: Diagnosis not present

## 2015-06-10 DIAGNOSIS — F329 Major depressive disorder, single episode, unspecified: Secondary | ICD-10-CM | POA: Diagnosis present

## 2015-06-10 DIAGNOSIS — I25110 Atherosclerotic heart disease of native coronary artery with unstable angina pectoris: Secondary | ICD-10-CM | POA: Diagnosis not present

## 2015-06-10 DIAGNOSIS — I25810 Atherosclerosis of coronary artery bypass graft(s) without angina pectoris: Secondary | ICD-10-CM | POA: Diagnosis not present

## 2015-06-10 DIAGNOSIS — I208 Other forms of angina pectoris: Secondary | ICD-10-CM | POA: Diagnosis not present

## 2015-06-10 DIAGNOSIS — R079 Chest pain, unspecified: Secondary | ICD-10-CM | POA: Diagnosis not present

## 2015-06-10 DIAGNOSIS — Z4682 Encounter for fitting and adjustment of non-vascular catheter: Secondary | ICD-10-CM | POA: Diagnosis not present

## 2015-06-10 DIAGNOSIS — J9811 Atelectasis: Secondary | ICD-10-CM | POA: Diagnosis not present

## 2015-06-10 DIAGNOSIS — E1159 Type 2 diabetes mellitus with other circulatory complications: Secondary | ICD-10-CM

## 2015-06-10 DIAGNOSIS — R0789 Other chest pain: Secondary | ICD-10-CM | POA: Diagnosis not present

## 2015-06-10 HISTORY — DX: Hypothyroidism, unspecified: E03.9

## 2015-06-10 HISTORY — DX: Type 2 diabetes mellitus without complications: E11.9

## 2015-06-10 HISTORY — DX: Essential (primary) hypertension: I10

## 2015-06-10 HISTORY — DX: Low back pain: M54.5

## 2015-06-10 HISTORY — DX: Unspecified osteoarthritis, unspecified site: M19.90

## 2015-06-10 HISTORY — DX: Low back pain, unspecified: M54.50

## 2015-06-10 HISTORY — DX: Hyperlipidemia, unspecified: E78.5

## 2015-06-10 HISTORY — DX: Other chronic pain: G89.29

## 2015-06-10 LAB — BASIC METABOLIC PANEL
Anion gap: 6 (ref 5–15)
BUN: 10 mg/dL (ref 6–20)
CO2: 29 mmol/L (ref 22–32)
CREATININE: 1.09 mg/dL (ref 0.61–1.24)
Calcium: 9.4 mg/dL (ref 8.9–10.3)
Chloride: 104 mmol/L (ref 101–111)
GFR calc Af Amer: >60 mL/min (ref >60)
GFR calc non Af Amer: >60 mL/min (ref >60)
GLUCOSE: 186 mg/dL — AB (ref 65–99)
Potassium: 4.5 mmol/L (ref 3.5–5.1)
Sodium: 139 mmol/L (ref 135–145)

## 2015-06-10 LAB — CBC WITH DIFFERENTIAL/PLATELET
BASOS ABS: 0 10*3/uL (ref 0.0–0.1)
Basophils Relative: 1 % (ref 0–1)
EOS PCT: 3 % (ref 0–5)
Eosinophils Absolute: 0.2 10*3/uL (ref 0.0–0.7)
HEMATOCRIT: 43 % (ref 39.0–52.0)
HEMOGLOBIN: 14.8 g/dL (ref 13.0–17.0)
LYMPHS PCT: 27 % (ref 12–46)
Lymphs Abs: 1.7 10*3/uL (ref 0.7–4.0)
MCH: 31.1 pg (ref 26.0–34.0)
MCHC: 34.4 g/dL (ref 30.0–36.0)
MCV: 90.3 fL (ref 78.0–100.0)
MONO ABS: 0.6 10*3/uL (ref 0.1–1.0)
Monocytes Relative: 10 % (ref 3–12)
Neutro Abs: 3.6 10*3/uL (ref 1.7–7.7)
Neutrophils Relative %: 59 % (ref 43–77)
Platelets: 167 10*3/uL (ref 150–400)
RBC: 4.76 MIL/uL (ref 4.22–5.81)
RDW: 12.5 % (ref 11.5–15.5)
WBC: 6.1 10*3/uL (ref 4.0–10.5)

## 2015-06-10 LAB — CBG MONITORING, ED: Glucose-Capillary: 90 mg/dL (ref 65–99)

## 2015-06-10 LAB — I-STAT TROPONIN, ED: Troponin i, poc: 0 ng/mL (ref 0.00–0.08)

## 2015-06-10 LAB — GLUCOSE, CAPILLARY
GLUCOSE-CAPILLARY: 201 mg/dL — AB (ref 65–99)
GLUCOSE-CAPILLARY: 191 mg/dL — AB (ref 65–99)

## 2015-06-10 LAB — MRSA PCR SCREENING: MRSA BY PCR: NEGATIVE

## 2015-06-10 LAB — TROPONIN I: Troponin I: <0.03 ng/mL (ref <0.031)

## 2015-06-10 MED ORDER — INSULIN ASPART 100 UNIT/ML ~~LOC~~ SOLN
0.0000 [IU] | Freq: Every day | SUBCUTANEOUS | Status: DC
  Administered 2015-06-11 – 2015-06-12 (×2): 2 [IU] via SUBCUTANEOUS

## 2015-06-10 MED ORDER — LISINOPRIL 2.5 MG PO TABS
2.5000 mg | ORAL_TABLET | Freq: Every day | ORAL | Status: DC
  Administered 2015-06-10 – 2015-06-13 (×4): 2.5 mg via ORAL
  Filled 2015-06-10 (×4): qty 1

## 2015-06-10 MED ORDER — NITROGLYCERIN 2 % TD OINT
0.5000 [in_us] | TOPICAL_OINTMENT | Freq: Four times a day (QID) | TRANSDERMAL | Status: DC
  Administered 2015-06-10: 0.5 [in_us] via TOPICAL
  Filled 2015-06-10: qty 30
  Filled 2015-06-10: qty 1

## 2015-06-10 MED ORDER — GI COCKTAIL ~~LOC~~: 30.0000 mL | Freq: Four times a day (QID) | ORAL | Status: DC | PRN

## 2015-06-10 MED ORDER — ZOLPIDEM TARTRATE 5 MG PO TABS
5.0000 mg | ORAL_TABLET | Freq: Once | ORAL | Status: AC
  Administered 2015-06-10: 5 mg via ORAL
  Filled 2015-06-10: qty 1

## 2015-06-10 MED ORDER — ACETAMINOPHEN 325 MG PO TABS: 650.0000 mg | ORAL_TABLET | ORAL | Status: DC | PRN

## 2015-06-10 MED ORDER — SODIUM CHLORIDE 0.9 % WEIGHT BASED INFUSION
3.0000 mL/kg/h | INTRAVENOUS | Status: DC
  Administered 2015-06-11: 3 mL/kg/h via INTRAVENOUS

## 2015-06-10 MED ORDER — ASPIRIN 81 MG PO CHEW
324.0000 mg | CHEWABLE_TABLET | Freq: Once | ORAL | Status: AC
  Administered 2015-06-10: 324 mg via ORAL
  Filled 2015-06-10: qty 4

## 2015-06-10 MED ORDER — ENOXAPARIN SODIUM 40 MG/0.4ML ~~LOC~~ SOLN
40.0000 mg | SUBCUTANEOUS | Status: DC
  Filled 2015-06-10: qty 0.4

## 2015-06-10 MED ORDER — LEVOTHYROXINE SODIUM 75 MCG PO TABS
75.0000 ug | ORAL_TABLET | Freq: Every day | ORAL | Status: DC
  Administered 2015-06-11 – 2015-06-19 (×8): 75 ug via ORAL
  Filled 2015-06-10 (×10): qty 1

## 2015-06-10 MED ORDER — INSULIN ASPART 100 UNIT/ML ~~LOC~~ SOLN
0.0000 [IU] | Freq: Three times a day (TID) | SUBCUTANEOUS | Status: DC
  Administered 2015-06-10: 5 [IU] via SUBCUTANEOUS
  Administered 2015-06-11: 2 [IU] via SUBCUTANEOUS
  Administered 2015-06-12 (×2): 3 [IU] via SUBCUTANEOUS
  Administered 2015-06-12 – 2015-06-13 (×4): 2 [IU] via SUBCUTANEOUS

## 2015-06-10 MED ORDER — ASPIRIN EC 325 MG PO TBEC
325.0000 mg | DELAYED_RELEASE_TABLET | Freq: Every day | ORAL | Status: DC
  Administered 2015-06-11 – 2015-06-13 (×3): 325 mg via ORAL
  Filled 2015-06-10 (×3): qty 1

## 2015-06-10 MED ORDER — METOPROLOL TARTRATE 25 MG PO TABS
25.0000 mg | ORAL_TABLET | Freq: Two times a day (BID) | ORAL | Status: DC
  Administered 2015-06-10 – 2015-06-13 (×7): 25 mg via ORAL
  Filled 2015-06-10 (×7): qty 1

## 2015-06-10 MED ORDER — SODIUM CHLORIDE 0.9 % IJ SOLN
3.0000 mL | Freq: Two times a day (BID) | INTRAMUSCULAR | Status: DC
  Administered 2015-06-10: 3 mL via INTRAVENOUS

## 2015-06-10 MED ORDER — TRAMADOL HCL 50 MG PO TABS
50.0000 mg | ORAL_TABLET | Freq: Four times a day (QID) | ORAL | Status: DC | PRN
  Administered 2015-06-10 – 2015-06-13 (×3): 50 mg via ORAL
  Filled 2015-06-10 (×3): qty 1

## 2015-06-10 MED ORDER — SODIUM CHLORIDE 0.9 % IV SOLN: 250.0000 mL | INTRAVENOUS | Status: DC | PRN

## 2015-06-10 MED ORDER — MORPHINE SULFATE 2 MG/ML IJ SOLN
2.0000 mg | INTRAMUSCULAR | Status: DC | PRN
  Administered 2015-06-10: 2 mg via INTRAVENOUS
  Filled 2015-06-10: qty 1

## 2015-06-10 MED ORDER — SODIUM CHLORIDE 0.9 % IJ SOLN: 3.0000 mL | INTRAMUSCULAR | Status: DC | PRN

## 2015-06-10 MED ORDER — LEVOTHYROXINE SODIUM 75 MCG PO TABS: 75.0000 ug | ORAL_TABLET | Freq: Every day | ORAL | Status: DC

## 2015-06-10 MED ORDER — ONDANSETRON HCL 4 MG/2ML IJ SOLN: 4.0000 mg | Freq: Four times a day (QID) | INTRAMUSCULAR | Status: DC | PRN

## 2015-06-10 MED ORDER — SODIUM CHLORIDE 0.9 % WEIGHT BASED INFUSION
1.0000 mL/kg/h | INTRAVENOUS | Status: DC
  Administered 2015-06-11: 3.098 mL/kg/h via INTRAVENOUS
  Administered 2015-06-11: 1 mL/kg/h via INTRAVENOUS

## 2015-06-10 NOTE — ED Notes (Signed)
Pt ambulated to restroom with steady gait.

## 2015-06-10 NOTE — H&P (Signed)
[facility] History and Physical                                                                                    Hunter Sparks, is a 69 y.o. male  MRN: [id number]   DOB - [birth year]  Admit Date - 06/10/2015  Outpatient Primary MD for the patient is Alonza Bogus, MD  Referring MD: [place] / ER  Consulting M.D; [facility] / Cardiology  With History of -  Past Medical History  Diagnosis Date  . Diabetes mellitus   . Anxiety   . Depression   . Arthritis   . Kidney stone       Past Surgical History  Procedure Laterality Date  . Hand surgery      bilateral  . Colonoscopy  06/21/2011    Procedure: COLONOSCOPY;  Surgeon: Rogene Houston, MD;  Location: [facility];  Service: Endoscopy;  Laterality: N/A;    in for   Chief Complaint  Patient presents with  . Chest Pain     HPI This is a 69 year old male patient with history of diabetes, chronic low back pain and hypothyroidism who presented to the ER with exertional chest pain. The patient has been having issues with exertional chest pain over the past 12 months which has become progressively worse with more prolonged episodes per each occurrence. He saw his primary care provider last week because of worsening symptoms and was set up to see the cardiologist Dr. Harl Bowie in [place] next week. Unfortunately the patient has continued to have exertional episodes of chest pain which sound very typical in nature. They are occurring on the left side of his chest radiating to his back and underneath his left arm. Episodes are associated with shortness of breath and nausea. No diaphoresis. These episodes initially lasted only about 10-20 minutes but lately have been more prolonged in nature sometimes occurring for over 12 hours with a waxing and waning quality before symptoms finally abate. Rest will improve the symptoms. Patient had been given nitroglycerin but only took one tablet which did not resolve his symptoms. Patient is now  developing rest chest pain as well which only last about 10 minutes. In the ER patient has been given a full dose aspirin. He continued to have waxing and waning chest discomfort. No apparent GI symptoms including no epigastric discomfort or similar chest pain symptoms after eating.   Patient was afebrile, pulse was 79, respirations 16, BP 156/75, room air saturations 98%. Operatory data unremarkable except for glucose 186, initial troponin 0.00, CBC normal, EKG was sinus rhythm without any acute ischemic changes. Two-view chest x-ray without any active disease.  Review of Systems   In addition to the HPI above,  No Fever-chills, myalgias or other constitutional symptoms No Headache, changes with Vision or hearing, new weakness, tingling, numbness in any extremity, No problems swallowing food or Liquids, indigestion/reflux No Cough, palpitations, orthopnea  No Abdominal pain, N/V; no melena or hematochezia, no dark tarry stools, Bowel movements are regular, No dysuria, hematuria or flank pain No new skin rashes, lesions, masses or bruises, No new joints pains-aches No recent weight gain or loss No polyuria, polydypsia  or polyphagia,  *A full 10 point Review of Systems was done, except as stated above, all other Review of Systems were negative.  Social History History  Substance Use Topics  . Smoking status: Former Smoker    Quit date: 06/21/2003  . Smokeless tobacco: Not on file  . Alcohol Use: No    Resides at: Private residence  Lives with: Wife  Ambulatory status: Without assistive devices   Family History Father: Gastric cancer Mother: Hypertension and CVA   Prior to Admission medications   Medication Sig Start Date End Date Taking? Authorizing Provider  levothyroxine (SYNTHROID, LEVOTHROID) 75 MCG tablet Take 75 mcg by mouth daily.   Yes Historical Provider, MD  lisinopril (PRINIVIL,ZESTRIL) 2.5 MG tablet Take 2.5 mg by mouth at bedtime. 04/11/15  Yes Historical  Provider, MD  metFORMIN (GLUCOPHAGE) 500 MG tablet Take 500 mg by mouth 2 (two) times daily with a meal.     Yes Historical Provider, MD  NITROSTAT 0.4 MG SL tablet Take 0.4 mg by mouth once as needed. Chest pain 05/18/15  Yes Historical Provider, MD  traMADol (ULTRAM) 50 MG tablet Take 50 mg by mouth daily as needed. pain 05/24/15  Yes Historical Provider, MD  HYDROmorphone (DILAUDID) 4 MG tablet Take 0.5-1 tablets (2-4 mg total) by mouth every 4 (four) hours as needed for pain. Patient not taking: Reported on 02/12/2015 12/12/12   Shanon Rosser, MD  naproxen sodium (ALEVE) 220 MG tablet Take 2 tablets every 12 hours until stone passes. Patient not taking: Reported on 06/10/2015 12/12/12   Shanon Rosser, MD  Tamsulosin HCl (FLOMAX) 0.4 MG CAPS Take 1 capsule daily until stone passes. Patient not taking: Reported on 02/12/2015 12/12/12   Shanon Rosser, MD    Allergies  Allergen Reactions  . Aspirin Itching  . Penicillins Rash    Physical Exam  Vitals  Blood pressure 146/90, pulse 77, temperature 98.2 F (36.8 C), temperature source Oral, resp. rate 16, SpO2 98 %.   General:  In no acute distress, appears healthy and well nourished  Psych:  Normal affect, Denies Suicidal or Homicidal ideations, Awake Alert, Oriented X 3. Speech and thought patterns are clear and appropriate, no apparent short term memory deficits  Neuro:   No focal neurological deficits, CN II through XII intact, Strength 5/5 all 4 extremities, Sensation intact all 4 extremities.  ENT:  Ears and Eyes appear Normal, Conjunctivae clear, PER. Moist oral mucosa without erythema or exudates.  Neck:  Supple, No lymphadenopathy appreciated  Respiratory:  Symmetrical chest wall movement, Good air movement bilaterally, CTAB. Room Air  Cardiac:  RRR, No Murmurs, no LE edema noted, no JVD, No carotid bruits, peripheral pulses palpable at 2+  Abdomen:  Positive bowel sounds, Soft, Non tender, Non distended,  No masses appreciated, no  obvious hepatosplenomegaly  Skin:  No Cyanosis, Normal Skin Turgor, No Skin Rash or Bruise.  Extremities: Symmetrical without obvious trauma or injury,  no effusions.  Data Review  CBC  Recent Labs Lab 06/10/15 1237  WBC 6.1  HGB 14.8  HCT 43.0  PLT 167  MCV 90.3  MCH 31.1  MCHC 34.4  RDW 12.5  LYMPHSABS 1.7  MONOABS 0.6  EOSABS 0.2  BASOSABS 0.0    Chemistries   Recent Labs Lab 06/10/15 1237  NA 139  K 4.5  CL 104  CO2 29  GLUCOSE 186*  BUN 10  CREATININE 1.09  CALCIUM 9.4    CrCl cannot be calculated (Unknown ideal weight.).  No results  for input(s): TSH, T4TOTAL, T3FREE, THYROIDAB in the last 72 hours.  Invalid input(s): FREET3  Coagulation profile No results for input(s): INR, PROTIME in the last 168 hours.  No results for input(s): DDIMER in the last 72 hours.  Cardiac Enzymes No results for input(s): CKMB, TROPONINI, MYOGLOBIN in the last 168 hours.  Invalid input(s): CK  Invalid input(s): POCBNP  Urinalysis    Component Value Date/Time   COLORURINE YELLOW 12/12/2012 0410   APPEARANCEUR CLEAR 12/12/2012 0410   LABSPEC >1.030* 12/12/2012 0410   PHURINE 5.5 12/12/2012 0410   GLUCOSEU NEGATIVE 12/12/2012 0410   HGBUR LARGE* 12/12/2012 0410   BILIRUBINUR NEGATIVE 12/12/2012 0410   KETONESUR NEGATIVE 12/12/2012 0410   PROTEINUR TRACE* 12/12/2012 0410   UROBILINOGEN 0.2 12/12/2012 0410   NITRITE NEGATIVE 12/12/2012 0410   LEUKOCYTESUR NEGATIVE 12/12/2012 0410    Imaging results:   Dg Chest 2 View  06/10/2015   CLINICAL DATA:  Chest pain for 2 weeks  EXAM: CHEST - 2 VIEW  COMPARISON:  None.  FINDINGS: Cardiac shadow is within normal limits. The lungs are well aerated bilaterally. No focal infiltrate, effusion or pneumothorax is noted. Mild degenerative change of the thoracic spine is noted. No acute abnormality is noted.  IMPRESSION: No active disease.   Electronically Signed   By: Inez Catalina M.D.   On: 06/10/2015 13:10   Dg Facet Jt  Inj C/t Single Level Left W/fl/ct  05/19/2015   CLINICAL DATA:  Chronic lumbago/low back pain. Mid back pain radiating to the anterior and posterior leg on the LEFT. 75% relief with last facet injection for 3 months.  EXAM: LUMBAR FACET INJECTION, LEFT L4-L5 and LEFT L5-S1  FLUOROSCOPY TIME:  0 minutes 59 seconds  Dose: 0.353 Gycm2  COMPARISON:  01/29/2015.  PROCEDURE: Consent was obtained after a discussion of risks and benefits of the procedure. General risks included bleeding, infection, injury to nerves, blood vessels, and adjacent structures. Specific risks included nondiagnostic and nontherapeutic injection, non target injection, worsening of back pain.  The skin was prepped and draped in the usual sterile fashion. Under fluoroscopic guidance a curved 22 gauge 3 1/2-inch spinal needle was advanced into the facet joint at L4-L5 and L5-S1 on the LEFT. Injection of 0.50 ml of Omnipaque 180 was performed which showed intra-articular needle placement. No vascular uptake was observed. Subsequently, a small amount of 0.25% sensorcaine, estimated at 0.5 ml with 60 mg of Depo-Medrol was injected into each facet joint. Concordant LEFT lower extremity radiculopathy was experienced with LEFT facet joint injection.  The patient tolerated the procedure well and there were no complications. The patient was observed in the holding room for 30 minutes prior to discharge.  IMPRESSION: Technically successful LEFT L4-L5 and LEFT L5-S1 facet injections with steroid and anesthetic. The patient could be considered for repeat facet injections in 2-3 months if 50% pain relief from today's injections for 6 weeks.   Electronically Signed   By: Dereck Ligas M.D.   On: 05/19/2015 11:36   Dg Facet Jt Inj L /s  2nd Level Left W/fl/ct  05/19/2015   CLINICAL DATA:  Chronic lumbago/low back pain. Mid back pain radiating to the anterior and posterior leg on the LEFT. 75% relief with last facet injection for 3 months.  EXAM: LUMBAR FACET  INJECTION, LEFT L4-L5 and LEFT L5-S1  FLUOROSCOPY TIME:  0 minutes 59 seconds  Dose: 0.353 Gycm2  COMPARISON:  01/29/2015.  PROCEDURE: Consent was obtained after a discussion of risks and benefits of  the procedure. General risks included bleeding, infection, injury to nerves, blood vessels, and adjacent structures. Specific risks included nondiagnostic and nontherapeutic injection, non target injection, worsening of back pain.  The skin was prepped and draped in the usual sterile fashion. Under fluoroscopic guidance a curved 22 gauge 3 1/2-inch spinal needle was advanced into the facet joint at L4-L5 and L5-S1 on the LEFT. Injection of 0.50 ml of Omnipaque 180 was performed which showed intra-articular needle placement. No vascular uptake was observed. Subsequently, a small amount of 0.25% sensorcaine, estimated at 0.5 ml with 60 mg of Depo-Medrol was injected into each facet joint. Concordant LEFT lower extremity radiculopathy was experienced with LEFT facet joint injection.  The patient tolerated the procedure well and there were no complications. The patient was observed in the holding room for 30 minutes prior to discharge.  IMPRESSION: Technically successful LEFT L4-L5 and LEFT L5-S1 facet injections with steroid and anesthetic. The patient could be considered for repeat facet injections in 2-3 months if 50% pain relief from today's injections for 6 weeks.   Electronically Signed   By: Dereck Ligas M.D.   On: 05/19/2015 11:36     EKG: (Independently reviewed) sinus rhythm with normal QTC obvious ST or T-wave changes or be concerning for ischemia   Assessment & Plan  Principal Problem:   Angina, class II -Admit to stepdown -Nitrol paste 1/2 inch-chest pain persist recommend transitioning to IV nitroglycerin -Cardiology consultation -Heart score 4 -We'll continue DVT prophylaxis Lovenox pending cardiology evaluation-if high index of suspicion this is truly ischemic in nature would need to  transition to full dose heparin -Begin aspirin and beta blocker -Suspect will at least need NM stress test this admission -Echocardiogram -Cycle troponin -Fasting lipid panel  Active Problems:   Diabetes mellitus type 2, controlled -Hold metformin acutely in favor of SSI -ck hemoglobin A1c    Hypothyroidism -ck TSH -Continue Synthroid    History of tobacco use -Has not utilized in greater than 20 years    Chronic low back pain -Continue Ultram     DVT Prophylaxis: Lovenox  Family Communication:   Wife at bedside  Code Status:  Full code  Condition:  Stable  Discharge disposition: Anticipate discharge back to home pending cardiac ischemic evaluation  Time spent in minutes : 60      Zema Lizardo L. ANP on 06/10/2015 at 2:05 PM  Between 7am to 7pm - Pager - [phone]  After 7pm go to [email or web address] - password [email or web address]  And look for the night coverage person covering me after hours  [facility]

## 2015-06-10 NOTE — ED Notes (Signed)
Pt presents with upper chest pain "for a while" that worsened this morning.  Pt reports pain today is to L side and radiates to L scapula.  +shortness of breath, especially with exertion.

## 2015-06-10 NOTE — ED Provider Notes (Signed)
Medical screening examination/treatment/procedure(s) were conducted as a shared visit with non-physician practitioner(s) and myself.  I personally evaluated the patient during the encounter.  69 yo M w/ classic story for unstable angina. Exam benign, lungs ctab, heart rrr no m/r/g. No fever, VS WNL. CP at current, while at rest. cxr ok, ecg with new aflutter. Already has cards follow up but story concerning so will admit for cardiology workup.    EKG Interpretation   Date/Time:  Thursday June 10 2015 11:34:33 EDT Ventricular Rate:  83 PR Interval:    QRS Duration: 88 QT Interval:  342 QTC Calculation: 401 R Axis:   36 Text Interpretation:  Atrial flutter with variable A-V block  new since  2009 Nonspecific ST abnormality Abnormal ECG Confirmed by [facility] MD, Corene Cornea  [phone]) on 06/10/2015 1:26:34 PM        Merrily Pew, MD 06/10/15 2038

## 2015-06-10 NOTE — Consult Note (Signed)
CARDIOLOGY CONSULT NOTE      Patient ID: Hunter Sparks MRN: [id number] DOB/AGE: December 05, 1945 69 y.o.  Admit date: 06/10/2015 Referring PhysicianJacob Joyice Faster, DO Primary Ardath Sax, MD Primary Cardiologist (new) Reason for Consultation chest discomfort  HPI: 69 year old man who has had diabetes for several years. He has been experiencing chest discomfort over the past 12 months. Recently, he has been getting more intense chest discomfort with less exertion. Now, when he does basic yard work, he will get some pressure in the center of his chest. He feels like it's his heart. It goes away with rest. He is getting more fatigued easily. He gets short of breath as well with activity which is new for him.  He denies any bleeding problems. He does reliably take his medicines.  Review of systems complete and found to be negative unless listed above   Past Medical History  Diagnosis Date  . Diabetes mellitus   . Anxiety   . Depression   . Arthritis   . Kidney stone     History reviewed. No pertinent family history.  History   Social History  . Marital Status: Married    Spouse Name: N/A  . Number of Children: N/A  . Years of Education: N/A   Occupational History  . Not on file.   Social History Main Topics  . Smoking status: Former Smoker    Quit date: 06/21/2003  . Smokeless tobacco: Not on file  . Alcohol Use: No  . Drug Use: No  . Sexual Activity: Not on file   Other Topics Concern  . Not on file   Social History Narrative    Past Surgical History  Procedure Laterality Date  . Hand surgery      bilateral  . Colonoscopy  06/21/2011    Procedure: COLONOSCOPY;  Surgeon: Rogene Houston, MD;  Location: [facility];  Service: Endoscopy;  Laterality: N/A;     Prescriptions prior to admission  Medication Sig Dispense Refill Last Dose  . levothyroxine (SYNTHROID, LEVOTHROID) 75 MCG tablet Take 75 mcg by mouth daily.   06/09/2015 at Unknown time  .  lisinopril (PRINIVIL,ZESTRIL) 2.5 MG tablet Take 2.5 mg by mouth at bedtime.   06/09/2015 at Unknown time  . metFORMIN (GLUCOPHAGE) 500 MG tablet Take 500 mg by mouth 2 (two) times daily with a meal.     06/10/2015 at Unknown time  . NITROSTAT 0.4 MG SL tablet Take 0.4 mg by mouth once as needed. Chest pain   unk at unk  . traMADol (ULTRAM) 50 MG tablet Take 50 mg by mouth daily as needed. pain   06/09/2015 at Unknown time  . HYDROmorphone (DILAUDID) 4 MG tablet Take 0.5-1 tablets (2-4 mg total) by mouth every 4 (four) hours as needed for pain. (Patient not taking: Reported on 02/12/2015) 30 tablet 0 Not Taking at Unknown time  . naproxen sodium (ALEVE) 220 MG tablet Take 2 tablets every 12 hours until stone passes. (Patient not taking: Reported on 06/10/2015)   Not Taking at Unknown time  . Tamsulosin HCl (FLOMAX) 0.4 MG CAPS Take 1 capsule daily until stone passes. (Patient not taking: Reported on 02/12/2015) 15 capsule 0 Not Taking at Unknown time    Physical Exam: Vitals:   Filed Vitals:   06/10/15 1630 06/10/15 1727 06/10/15 1731 06/10/15 1736  BP: 121/59  148/75   Pulse: 79  86   Temp:   98.9 F (37.2 C)   TempSrc:   Oral  Resp: 11  12   Height:  6' (1.829 m)    Weight:    178 lb (80.74 kg)  SpO2: 95%  98%    I&O's:  No intake or output data in the 24 hours ending 06/10/15 1806 Physical exam:  [place]/AT EOMI No JVD, No carotid bruit RRR S1S2  No wheezing Soft. NT, nondistended No edema. No focal motor or sensory deficits Normal affect  Labs:   Lab Results  Component Value Date   WBC 6.1 06/10/2015   HGB 14.8 06/10/2015   HCT 43.0 06/10/2015   MCV 90.3 06/10/2015   PLT 167 06/10/2015    Recent Labs Lab 06/10/15 1237  NA 139  K 4.5  CL 104  CO2 29  BUN 10  CREATININE 1.09  CALCIUM 9.4  GLUCOSE 186*   Lab Results  Component Value Date   TROPONINI <0.03 06/10/2015   No results found for: CHOL No results found for: HDL No results found for: LDLCALC No results  found for: TRIG No results found for: CHOLHDL No results found for: LDLDIRECT    Radiology: No active disease  EKG: Normal  ASSESSMENT AND PLAN:  Principal Problem:   Angina, class II Active Problems:   Diabetes mellitus type 2, controlled   Hypothyroidism   History of tobacco use   Chronic low back pain  Accelerating angina. Class III. No rest pain at this point but ordinary activities are causing discomfort. The risks and benefits of cardiac catheterization were explained to the patient. We also discussed cath versus stress test. We will go ahead with cardiac catheterization tomorrow. He should be a good candidate for drug-eluting stent if revascularization is required. I think he has a high likelihood of disease. I would not believe a negative stress test. Both husband and wife are in agreement.   Orders for cath have been placed.  Signed:   Mina Marble, MD, [facility] 06/10/2015, 6:06 PM

## 2015-06-10 NOTE — ED Notes (Signed)
Pt transporting to xray. NAD.  

## 2015-06-10 NOTE — ED Provider Notes (Signed)
CSN: [id number]     Arrival date & time 06/10/15  1126 History   First MD Initiated Contact with Patient 06/10/15 1214     Chief Complaint  Patient presents with  . Chest Pain     (Consider location/radiation/quality/duration/timing/severity/associated sxs/prior Treatment) HPI Hunter Sparks is a 69 y.o male with a history of diabetes, anxiety, depression who presents for intermittent chest pain that began one week ago. He states the pain lasts up to 10 minutes at a time. He describes it as a pressure and it has moved from the mid sternum to the left side of the chest. He states that he went to see his primary care physician last week and had a normal EKG, was given nitroglycerin, a cardiology referral. They were unable to get an appointment until next week. His wife states that even walking up a small ramp at home he has become short of breath with chest pain. Worse with exertion. He did not take aspirin today. His wife states after breakfast this morning he was complaining of chest pain and stated he wanted to go to the hospital. He denies any prior cardiac history including MI, or stent placement. He denies any smoking but does have a history of smoking and quit in 2004. Patient denies any recent illness, cough, headache, vomiting, leg swelling. Past Medical History  Diagnosis Date  . Diabetes mellitus   . Anxiety   . Depression   . Arthritis   . Kidney stone    Past Surgical History  Procedure Laterality Date  . Hand surgery      bilateral  . Colonoscopy  06/21/2011    Procedure: COLONOSCOPY;  Surgeon: Rogene Houston, MD;  Location: [facility];  Service: Endoscopy;  Laterality: N/A;   History reviewed. No pertinent family history. History  Substance Use Topics  . Smoking status: Former Smoker    Quit date: 06/21/2003  . Smokeless tobacco: Not on file  . Alcohol Use: No    Review of Systems  Respiratory: Positive for shortness of breath.   Cardiovascular: Positive for chest  pain. Negative for leg swelling.  Gastrointestinal: Positive for nausea. Negative for vomiting.  All other systems reviewed and are negative.     Allergies  Aspirin and Penicillins  Home Medications   Prior to Admission medications   Medication Sig Start Date End Date Taking? Authorizing Provider  levothyroxine (SYNTHROID, LEVOTHROID) 75 MCG tablet Take 75 mcg by mouth daily.   Yes Historical Provider, MD  lisinopril (PRINIVIL,ZESTRIL) 2.5 MG tablet Take 2.5 mg by mouth at bedtime. 04/11/15  Yes Historical Provider, MD  metFORMIN (GLUCOPHAGE) 500 MG tablet Take 500 mg by mouth 2 (two) times daily with a meal.     Yes Historical Provider, MD  NITROSTAT 0.4 MG SL tablet Take 0.4 mg by mouth once as needed. Chest pain 05/18/15  Yes Historical Provider, MD  traMADol (ULTRAM) 50 MG tablet Take 50 mg by mouth daily as needed. pain 05/24/15  Yes Historical Provider, MD  HYDROmorphone (DILAUDID) 4 MG tablet Take 0.5-1 tablets (2-4 mg total) by mouth every 4 (four) hours as needed for pain. Patient not taking: Reported on 02/12/2015 12/12/12   Shanon Rosser, MD  naproxen sodium (ALEVE) 220 MG tablet Take 2 tablets every 12 hours until stone passes. Patient not taking: Reported on 06/10/2015 12/12/12   Shanon Rosser, MD  Tamsulosin HCl (FLOMAX) 0.4 MG CAPS Take 1 capsule daily until stone passes. Patient not taking: Reported on 02/12/2015 12/12/12  John Molpus, MD   BP 121/59 mmHg  Pulse 79  Temp(Src) 98.2 F (36.8 C) (Oral)  Resp 11  SpO2 95% Physical Exam  Constitutional: He is oriented to person, place, and time. He appears well-developed and well-nourished.  HENT:  Head: Normocephalic and atraumatic.  Eyes: Conjunctivae are normal.  Neck: Normal range of motion. Neck supple.  Cardiovascular: Normal rate, regular rhythm and normal heart sounds.   Pulmonary/Chest: Effort normal and breath sounds normal. No accessory muscle usage. No respiratory distress. He has no decreased breath sounds. He has no  wheezes. He has no rales.  Abdominal: Soft. There is no tenderness.  Musculoskeletal: Normal range of motion. He exhibits no edema or tenderness.  Neurological: He is alert and oriented to person, place, and time.  Skin: Skin is warm and dry.  Psychiatric: He has a normal mood and affect. His behavior is normal.  Nursing note and vitals reviewed.   ED Course  Procedures (including critical care time) Labs Review Labs Reviewed  BASIC METABOLIC PANEL - Abnormal; Notable for the following:    Glucose, Bld 186 (*)    All other components within normal limits  CBC WITH DIFFERENTIAL/PLATELET  TROPONIN I  TROPONIN I  TROPONIN I  HEMOGLOBIN A1C  I-STAT TROPOININ, ED  CBG MONITORING, ED    Imaging Review Dg Chest 2 View  06/10/2015   CLINICAL DATA:  Chest pain for 2 weeks  EXAM: CHEST - 2 VIEW  COMPARISON:  None.  FINDINGS: Cardiac shadow is within normal limits. The lungs are well aerated bilaterally. No focal infiltrate, effusion or pneumothorax is noted. Mild degenerative change of the thoracic spine is noted. No acute abnormality is noted.  IMPRESSION: No active disease.   Electronically Signed   By: Inez Catalina M.D.   On: 06/10/2015 13:10     EKG Interpretation   Date/Time:  Thursday June 10 2015 11:34:33 EDT Ventricular Rate:  83 PR Interval:    QRS Duration: 88 QT Interval:  342 QTC Calculation: 401 R Axis:   36 Text Interpretation:  Atrial flutter with variable A-V block  new since  2009 Nonspecific ST abnormality Abnormal ECG Confirmed by [facility] MD, Corene Cornea  [id number]) on 06/10/2015 1:26:34 PM      MDM   Final diagnoses:  Chest pain, unspecified chest pain type   Patient presents for chest pain and shortness of breath that has been intermittent for the past week. Due to patient's age and presentation, I think the patient would benefit from further cardiac workup. He has not had a recent echocardiogram or stress test. He has no cardiac history. His labs are unremarkable.  Troponin is negative. Chest x-ray is negative for infiltrate, effusion, or pneumothorax. EKG shows atrial flutter which is changed from his previous EKG in 2009. Patient is stable and in no acute distress. Dr. Lowella Dell spoke to A. Lissa Merlin, the mid-level, who will admit the patient to telemetry.     Ottie Glazier, PA-C 06/10/15 1700  Merrily Pew, MD 06/10/15 [phone]

## 2015-06-11 ENCOUNTER — Ambulatory Visit (HOSPITAL_COMMUNITY): Payer: Medicare Other

## 2015-06-11 ENCOUNTER — Encounter (HOSPITAL_COMMUNITY)
Admission: EM | Disposition: A | Discharge status: Home Health | Payer: Self-pay | Source: Home / Self Care | Attending: Cardiothoracic Surgery

## 2015-06-11 ENCOUNTER — Other Ambulatory Visit: Payer: Self-pay | Admitting: *Deleted

## 2015-06-11 ENCOUNTER — Encounter (HOSPITAL_COMMUNITY): Payer: Self-pay | Admitting: Physician Assistant

## 2015-06-11 ENCOUNTER — Telehealth (HOSPITAL_COMMUNITY): Payer: Self-pay | Admitting: Pharmacist

## 2015-06-11 DIAGNOSIS — E785 Hyperlipidemia, unspecified: Secondary | ICD-10-CM

## 2015-06-11 DIAGNOSIS — I2510 Atherosclerotic heart disease of native coronary artery without angina pectoris: Secondary | ICD-10-CM

## 2015-06-11 DIAGNOSIS — I10 Essential (primary) hypertension: Secondary | ICD-10-CM | POA: Insufficient documentation

## 2015-06-11 DIAGNOSIS — E039 Hypothyroidism, unspecified: Secondary | ICD-10-CM

## 2015-06-11 DIAGNOSIS — E782 Mixed hyperlipidemia: Secondary | ICD-10-CM | POA: Diagnosis present

## 2015-06-11 DIAGNOSIS — I25110 Atherosclerotic heart disease of native coronary artery with unstable angina pectoris: Principal | ICD-10-CM

## 2015-06-11 HISTORY — PX: CARDIAC CATHETERIZATION: SHX172

## 2015-06-11 HISTORY — DX: Hyperlipidemia, unspecified: E78.5

## 2015-06-11 LAB — HEPATIC FUNCTION PANEL
ALBUMIN: 3.6 g/dL (ref 3.5–5.0)
ALK PHOS: 60 U/L (ref 38–126)
ALT: 16 U/L — ABNORMAL LOW (ref 17–63)
AST: 20 U/L (ref 15–41)
BILIRUBIN INDIRECT: 0.6 mg/dL (ref 0.3–0.9)
BILIRUBIN TOTAL: 0.7 mg/dL (ref 0.3–1.2)
Bilirubin, Direct: 0.1 mg/dL (ref 0.1–0.5)
Total Protein: 6.4 g/dL — ABNORMAL LOW (ref 6.5–8.1)

## 2015-06-11 LAB — TROPONIN I: Troponin I: <0.03 ng/mL (ref <0.031)

## 2015-06-11 LAB — LIPID PANEL
Cholesterol: 222 mg/dL — ABNORMAL HIGH (ref 0–200)
HDL: 37 mg/dL — ABNORMAL LOW (ref >40)
LDL Cholesterol: 161 mg/dL — ABNORMAL HIGH (ref 0–99)
Total CHOL/HDL Ratio: 6 RATIO
Triglycerides: 118 mg/dL (ref <150)
VLDL: 24 mg/dL (ref 0–40)

## 2015-06-11 LAB — GLUCOSE, CAPILLARY
GLUCOSE-CAPILLARY: 111 mg/dL — AB (ref 65–99)
GLUCOSE-CAPILLARY: 227 mg/dL — AB (ref 65–99)
Glucose-Capillary: 141 mg/dL — ABNORMAL HIGH (ref 65–99)
Glucose-Capillary: 101 mg/dL — ABNORMAL HIGH (ref 65–99)

## 2015-06-11 LAB — HEMOGLOBIN A1C
Hgb A1c MFr Bld: 7.7 % — ABNORMAL HIGH (ref 4.8–5.6)
MEAN PLASMA GLUCOSE: 174 mg/dL

## 2015-06-11 LAB — PROTIME-INR
INR: 1.01 (ref 0.00–1.49)
PROTHROMBIN TIME: 13.5 s (ref 11.6–15.2)

## 2015-06-11 SURGERY — LEFT HEART CATH AND CORONARY ANGIOGRAPHY
Anesthesia: LOCAL

## 2015-06-11 MED ORDER — HEPARIN (PORCINE) IN NACL 100-0.45 UNIT/ML-% IJ SOLN
1400.0000 [IU]/h | INTRAMUSCULAR | Status: DC
  Administered 2015-06-11: 1200 [IU]/h via INTRAVENOUS
  Administered 2015-06-12: 1400 [IU]/h via INTRAVENOUS
  Filled 2015-06-11 (×2): qty 250

## 2015-06-11 MED ORDER — OXYCODONE-ACETAMINOPHEN 5-325 MG PO TABS
1.0000 | ORAL_TABLET | ORAL | Status: DC | PRN
  Administered 2015-06-12: 2 via ORAL
  Filled 2015-06-11: qty 2

## 2015-06-11 MED ORDER — MIDAZOLAM HCL 2 MG/2ML IJ SOLN
INTRAMUSCULAR | Status: DC | PRN
  Administered 2015-06-11: 1 mg via INTRAVENOUS

## 2015-06-11 MED ORDER — MIDAZOLAM HCL 2 MG/2ML IJ SOLN
INTRAMUSCULAR | Status: AC
  Filled 2015-06-11: qty 2

## 2015-06-11 MED ORDER — SODIUM CHLORIDE 0.9 % IJ SOLN: 3.0000 mL | INTRAMUSCULAR | Status: DC | PRN

## 2015-06-11 MED ORDER — LIDOCAINE HCL (PF) 1 % IJ SOLN
INTRAMUSCULAR | Status: AC
  Filled 2015-06-11: qty 30

## 2015-06-11 MED ORDER — LORAZEPAM 1 MG PO TABS
1.0000 mg | ORAL_TABLET | Freq: Once | ORAL | Status: AC
  Administered 2015-06-11: 1 mg via ORAL
  Filled 2015-06-11: qty 1

## 2015-06-11 MED ORDER — VERAPAMIL HCL 2.5 MG/ML IV SOLN
INTRAVENOUS | Status: DC | PRN
  Administered 2015-06-11: 15:00:00 via INTRA_ARTERIAL

## 2015-06-11 MED ORDER — NITROGLYCERIN 1 MG/10 ML FOR IR/CATH LAB
INTRA_ARTERIAL | Status: DC | PRN
  Administered 2015-06-11: 15:00:00

## 2015-06-11 MED ORDER — ACETAMINOPHEN 325 MG PO TABS
650.0000 mg | ORAL_TABLET | ORAL | Status: DC | PRN
  Administered 2015-06-12 – 2015-06-13 (×2): 650 mg via ORAL
  Filled 2015-06-11 (×2): qty 2

## 2015-06-11 MED ORDER — SODIUM CHLORIDE 0.9 % IJ SOLN
3.0000 mL | Freq: Two times a day (BID) | INTRAMUSCULAR | Status: DC
Start: 2015-06-11 — End: 2015-06-14
  Administered 2015-06-12 – 2015-06-13 (×3): 3 mL via INTRAVENOUS

## 2015-06-11 MED ORDER — SODIUM CHLORIDE 0.9 % WEIGHT BASED INFUSION
3.0000 mL/kg/h | INTRAVENOUS | Status: AC
  Administered 2015-06-11: 3 mL/kg/h via INTRAVENOUS

## 2015-06-11 MED ORDER — IOHEXOL 350 MG/ML SOLN
INTRAVENOUS | Status: DC | PRN
  Administered 2015-06-11: 100 mL via INTRAVENOUS

## 2015-06-11 MED ORDER — VERAPAMIL HCL 2.5 MG/ML IV SOLN
INTRAVENOUS | Status: AC
  Filled 2015-06-11: qty 2

## 2015-06-11 MED ORDER — SODIUM CHLORIDE 0.9 % IV SOLN: 250.0000 mL | INTRAVENOUS | Status: DC | PRN

## 2015-06-11 MED ORDER — NITROGLYCERIN IN D5W 200-5 MCG/ML-% IV SOLN
INTRAVENOUS | Status: AC
  Filled 2015-06-11: qty 250

## 2015-06-11 MED ORDER — HEPARIN (PORCINE) IN NACL 2-0.9 UNIT/ML-% IJ SOLN
INTRAMUSCULAR | Status: AC
  Filled 2015-06-11: qty 1000

## 2015-06-11 MED ORDER — HEPARIN SODIUM (PORCINE) 1000 UNIT/ML IJ SOLN
INTRAMUSCULAR | Status: AC
  Filled 2015-06-11: qty 1

## 2015-06-11 MED ORDER — ATORVASTATIN CALCIUM 40 MG PO TABS
40.0000 mg | ORAL_TABLET | Freq: Every day | ORAL | Status: DC
  Administered 2015-06-11 – 2015-06-18 (×7): 40 mg via ORAL
  Filled 2015-06-11 (×9): qty 1

## 2015-06-11 MED ORDER — HEPARIN SODIUM (PORCINE) 1000 UNIT/ML IJ SOLN
INTRAMUSCULAR | Status: DC | PRN
  Administered 2015-06-11: 4000 [IU] via INTRAVENOUS

## 2015-06-11 MED ORDER — NITROGLYCERIN IN D5W 200-5 MCG/ML-% IV SOLN
INTRAVENOUS | Status: DC | PRN
  Administered 2015-06-11: 10 ug/min via INTRAVENOUS

## 2015-06-11 MED ORDER — NITROGLYCERIN IN D5W 200-5 MCG/ML-% IV SOLN
0.0000 ug/min | INTRAVENOUS | Status: DC
  Administered 2015-06-11: 10 ug/min via INTRAVENOUS
  Filled 2015-06-11: qty 250

## 2015-06-11 MED ORDER — NITROGLYCERIN 1 MG/10 ML FOR IR/CATH LAB
INTRA_ARTERIAL | Status: AC
  Filled 2015-06-11: qty 10

## 2015-06-11 MED ORDER — FENTANYL CITRATE (PF) 100 MCG/2ML IJ SOLN
INTRAMUSCULAR | Status: DC | PRN
  Administered 2015-06-11: 50 ug via INTRAVENOUS

## 2015-06-11 MED ORDER — FENTANYL CITRATE (PF) 100 MCG/2ML IJ SOLN
INTRAMUSCULAR | Status: AC
  Filled 2015-06-11: qty 2

## 2015-06-11 SURGICAL SUPPLY — 9 items
CATH INFINITI 5 FR JL3.5 (CATHETERS) ×2 IMPLANT
CATH INFINITI JR4 5F (CATHETERS) ×2 IMPLANT
DEVICE RAD COMP TR BAND LRG (VASCULAR PRODUCTS) ×2 IMPLANT
GLIDESHEATH SLEND A-KIT 6F 22G (SHEATH) ×2 IMPLANT
KIT HEART LEFT (KITS) ×2 IMPLANT
PACK CARDIAC CATHETERIZATION (CUSTOM PROCEDURE TRAY) ×2 IMPLANT
TRANSDUCER W/STOPCOCK (MISCELLANEOUS) ×2 IMPLANT
TUBING CIL FLEX 10 FLL-RA (TUBING) ×2 IMPLANT
WIRE SAFE-T 1.5MM-J .035X260CM (WIRE) ×2 IMPLANT

## 2015-06-11 NOTE — Research (Signed)
[place] Informed Consent   Subject Name: Hunter Sparks  Subject met inclusion and exclusion criteria.  The informed consent form, study requirements and expectations were reviewed with the subject and questions and concerns were addressed prior to the signing of the consent form.  The subject verbalized understanding of the trail requirements.  The subject agreed to participate in the CADLAB trial and signed the informed consent.  The informed consent was obtained prior to performance of any protocol-specific procedures for the subject.  A copy of the signed informed consent was given to the subject and a copy was placed in the subject's medical record.  Sandie Ano 06/11/2015, 9:07 AM

## 2015-06-11 NOTE — Progress Notes (Signed)
Patient complains of severe anxiety; "everything is closing in on me." Paged NP on-call for [facility] to make aware and request medication.  Continuing to monitor.

## 2015-06-11 NOTE — Progress Notes (Signed)
ANTICOAGULATION CONSULT NOTE - Initial Consult  Pharmacy Consult for Heparin Indication: chest pain/ACS  Allergies  Allergen Reactions  . Aspirin Itching    Patient received ASA 324mg  PO, chewed, in ER on 06/10/2015 without any specific reaction.  Re-coded as intolerance, not allergy.  . Penicillins Rash    Patient Measurements: Height: 6' (182.9 cm) Weight: 175 lb 1.6 oz (79.425 kg) IBW/kg (Calculated) : 77.6  Vital Signs: Temp: 97.7 F (36.5 C) (07/29 0730) Temp Source: Oral (07/29 0730) BP: 116/73 mmHg (07/29 1530) Pulse Rate: 0 (07/29 1545)  Labs:  Recent Labs  06/10/15 1237 06/10/15 1941 06/11/15 0130 06/11/15 0431  HGB 14.8  --   --   --   HCT 43.0  --   --   --   PLT 167  --   --   --   LABPROT  --   --   --  13.5  INR  --   --   --  1.01  CREATININE 1.09  --   --   --   TROPONINI <0.03 <0.03 <0.03  --     Estimated Creatinine Clearance: 69.2 mL/min (by C-G formula based on Cr of 1.09).   Medical History: Past Medical History  Diagnosis Date  . Kidney stone   . Type II diabetes mellitus   . Hypertension   . Hypothyroidism   . Arthritis     "back, hips, legs" (06/10/2015)  . DJD (degenerative joint disease)   . Chronic lower back pain   . Hyperlipidemia 06/11/2015    Assessment: 69 year old male admitted with chest pain, now s/p cath, pharmacy asked to begin heparin 8 hours after sheath removal due to severe 3V CAD. CVTS consult pending.  Goal of Therapy:  Heparin level 0.3-0.7 units/ml Monitor platelets by anticoagulation protocol: Yes   Plan:  Heparin to begin at 2330 pm Heparin drip at 1200 units / hr Heparin level, CBC 8 hours after heparin starts (0730 AM) Daily heparin level, CBC  Thank you. Anette Guarneri, PharmD [phone]  06/11/2015,3:57 PM

## 2015-06-11 NOTE — Care Management Note (Signed)
Case Management Note  Patient Details  Name: Hunter Sparks MRN: [id number] Date of Birth: 09-17-1946  Subjective/Objective:    Pt admitted for cp. S/p cath revealed 3 vessel CAD.          Action/Plan: CM to monitor for disposition needs.   Expected Discharge Date:                  Expected Discharge Plan:  [place]  In-House Referral:     Discharge planning Services  CM Consult  Post Acute Care Choice:    Choice offered to:     DME Arranged:    DME Agency:     HH Arranged:    [place] Agency:     Status of Service:  In process, will continue to follow  Medicare Important Message Given:    Date Medicare IM Given:    Medicare IM give by:    Date Additional Medicare IM Given:    Additional Medicare Important Message give by:     If discussed at [place] of Stay Meetings, dates discussed:    Additional Comments:  Bethena Roys, RN 06/11/2015, 4:50 PM

## 2015-06-11 NOTE — Progress Notes (Signed)
PATIENT DETAILS Name: Hunter Sparks Age: 69 y.o. Sex: male Date of Birth: 1946-07-19 Admit Date: 06/10/2015 Admitting Physician Truett Mainland, DO BLT:[id number],ZRAQTM L, MD  Subjective: No chest pain during my rounds this morning.  Assessment/Plan: Principal Problem: Suspected unstable angina:  Currently chest pain-free, troponins negative. Continue aspirin, statin and metoprolol. Evaluated by cardiology, plans are for cardiac catheterization today-Will follow.   Active Problems: Diabetes mellitus type 2, controlled: CBGs stable with SSI. Metformin remains on hold. A1c at 7.7  Hypothyroidism: Continue with levothyroxine  Hypertension: Stable-continue with metoprolol and lisinopril  Disposition: Remain inpatient  Antimicrobial agents  See below  Anti-infectives    None      DVT Prophylaxis: Prophylactic Lovenox   Code Status: Full code   Family Communication Spouse at bedside  Procedures: None  CONSULTS:  cardiology  Time spent 25 minutes-Greater than 50% of this time was spent in counseling, explanation of diagnosis, planning of further management, and coordination of care.  MEDICATIONS: Scheduled Meds: . [MAR Hold] aspirin EC  325 mg Oral Daily  . [MAR Hold] atorvastatin  40 mg Oral q1800  . [MAR Hold] enoxaparin (LOVENOX) injection  40 mg Subcutaneous Q24H  . [MAR Hold] insulin aspart  0-15 Units Subcutaneous TID WC  . [MAR Hold] insulin aspart  0-5 Units Subcutaneous QHS  . [MAR Hold] levothyroxine  75 mcg Oral QAC breakfast  . [MAR Hold] lisinopril  2.5 mg Oral QHS  . [MAR Hold] metoprolol tartrate  25 mg Oral BID  . [MAR Hold] nitroGLYCERIN  0.5 inch Topical 4 times per day  . sodium chloride  3 mL Intravenous Q12H   Continuous Infusions: . sodium chloride 1 mL/kg/hr (06/11/15 0832)   PRN Meds:.sodium chloride, [MAR Hold] acetaminophen, [MAR Hold] gi cocktail, [MAR Hold]  morphine injection, [MAR Hold] ondansetron (ZOFRAN)  IV, sodium chloride, [MAR Hold] traMADol    PHYSICAL EXAM: Vital signs in last 24 hours: Filed Vitals:   06/11/15 0107 06/11/15 0553 06/11/15 0730 06/11/15 1012  BP: 120/71   102/69  Pulse: 69   64  Temp:  97.5 F (36.4 C) 97.7 F (36.5 C)   TempSrc:  Oral Oral   Resp: 14     Height:      Weight:  79.425 kg (175 lb 1.6 oz)    SpO2: 96%       Weight change:  Filed Weights   06/10/15 1736 06/11/15 0553  Weight: 80.74 kg (178 lb) 79.425 kg (175 lb 1.6 oz)   Body mass index is 23.74 kg/(m^2).   Gen Exam: Awake and alert with clear speech.   Neck: Supple, No JVD.   Chest: B/L Clear.   CVS: S1 S2 Regular, no murmurs.  Abdomen: soft, BS +, non tender, non distended.  Extremities: no edema, lower extremities warm to touch. Neurologic: Non Focal.   Skin: No Rash.   Wounds: N/A.   Intake/Output from previous day:  Intake/Output Summary (Last 24 hours) at 06/11/15 1420 Last data filed at 06/11/15 0659  Gross per 24 hour  Intake 217.89 ml  Output      0 ml  Net 217.89 ml     LAB RESULTS: CBC  Recent Labs Lab 06/10/15 1237  WBC 6.1  HGB 14.8  HCT 43.0  PLT 167  MCV 90.3  MCH 31.1  MCHC 34.4  RDW 12.5  LYMPHSABS 1.7  MONOABS 0.6  EOSABS 0.2  BASOSABS 0.0  Chemistries   Recent Labs Lab 06/10/15 1237  NA 139  K 4.5  CL 104  CO2 29  GLUCOSE 186*  BUN 10  CREATININE 1.09  CALCIUM 9.4    CBG:  Recent Labs Lab 06/10/15 1525 06/10/15 1735 06/10/15 2040 06/11/15 0728 06/11/15 1140  GLUCAP 90 201* 191* 141* 101*    GFR Estimated Creatinine Clearance: 69.2 mL/min (by C-G formula based on Cr of 1.09).  Coagulation profile  Recent Labs Lab 06/11/15 0431  INR 1.01    Cardiac Enzymes  Recent Labs Lab 06/10/15 1237 06/10/15 1941 06/11/15 0130  TROPONINI <0.03 <0.03 <0.03    Invalid input(s): POCBNP No results for input(s): DDIMER in the last 72 hours.  Recent Labs  06/10/15 1542  HGBA1C 7.7*    Recent Labs   06/11/15 0130  CHOL 222*  HDL 37*  LDLCALC 161*  TRIG 118  CHOLHDL 6.0   No results for input(s): TSH, T4TOTAL, T3FREE, THYROIDAB in the last 72 hours.  Invalid input(s): FREET3 No results for input(s): VITAMINB12, FOLATE, FERRITIN, TIBC, IRON, RETICCTPCT in the last 72 hours. No results for input(s): LIPASE, AMYLASE in the last 72 hours.  Urine Studies No results for input(s): UHGB, CRYS in the last 72 hours.  Invalid input(s): UACOL, UAPR, USPG, UPH, UTP, UGL, UKET, UBIL, UNIT, UROB, ULEU, UEPI, UWBC, URBC, UBAC, CAST, UCOM, BILUA  MICROBIOLOGY: Recent Results (from the past 240 hour(s))  MRSA PCR Screening     Status: None   Collection Time: 06/10/15  6:04 PM  Result Value Ref Range Status   MRSA by PCR NEGATIVE NEGATIVE Final    Comment:        The GeneXpert MRSA Assay (FDA approved for NASAL specimens only), is one component of a comprehensive MRSA colonization surveillance program. It is not intended to diagnose MRSA infection nor to guide or monitor treatment for MRSA infections.     RADIOLOGY STUDIES/RESULTS: Dg Chest 2 View  06/10/2015   CLINICAL DATA:  Chest pain for 2 weeks  EXAM: CHEST - 2 VIEW  COMPARISON:  None.  FINDINGS: Cardiac shadow is within normal limits. The lungs are well aerated bilaterally. No focal infiltrate, effusion or pneumothorax is noted. Mild degenerative change of the thoracic spine is noted. No acute abnormality is noted.  IMPRESSION: No active disease.   Electronically Signed   By: Inez Catalina M.D.   On: 06/10/2015 13:10   Dg Facet Jt Inj C/t Single Level Left W/fl/ct  05/19/2015   CLINICAL DATA:  Chronic lumbago/low back pain. Mid back pain radiating to the anterior and posterior leg on the LEFT. 75% relief with last facet injection for 3 months.  EXAM: LUMBAR FACET INJECTION, LEFT L4-L5 and LEFT L5-S1  FLUOROSCOPY TIME:  0 minutes 59 seconds  Dose: 0.353 Gycm2  COMPARISON:  01/29/2015.  PROCEDURE: Consent was obtained after a discussion  of risks and benefits of the procedure. General risks included bleeding, infection, injury to nerves, blood vessels, and adjacent structures. Specific risks included nondiagnostic and nontherapeutic injection, non target injection, worsening of back pain.  The skin was prepped and draped in the usual sterile fashion. Under fluoroscopic guidance a curved 22 gauge 3 1/2-inch spinal needle was advanced into the facet joint at L4-L5 and L5-S1 on the LEFT. Injection of 0.50 ml of Omnipaque 180 was performed which showed intra-articular needle placement. No vascular uptake was observed. Subsequently, a small amount of 0.25% sensorcaine, estimated at 0.5 ml with 60 mg of Depo-Medrol was injected into  each facet joint. Concordant LEFT lower extremity radiculopathy was experienced with LEFT facet joint injection.  The patient tolerated the procedure well and there were no complications. The patient was observed in the holding room for 30 minutes prior to discharge.  IMPRESSION: Technically successful LEFT L4-L5 and LEFT L5-S1 facet injections with steroid and anesthetic. The patient could be considered for repeat facet injections in 2-3 months if 50% pain relief from today's injections for 6 weeks.   Electronically Signed   By: Dereck Ligas M.D.   On: 05/19/2015 11:36   Dg Facet Jt Inj L /s  2nd Level Left W/fl/ct  05/19/2015   CLINICAL DATA:  Chronic lumbago/low back pain. Mid back pain radiating to the anterior and posterior leg on the LEFT. 75% relief with last facet injection for 3 months.  EXAM: LUMBAR FACET INJECTION, LEFT L4-L5 and LEFT L5-S1  FLUOROSCOPY TIME:  0 minutes 59 seconds  Dose: 0.353 Gycm2  COMPARISON:  01/29/2015.  PROCEDURE: Consent was obtained after a discussion of risks and benefits of the procedure. General risks included bleeding, infection, injury to nerves, blood vessels, and adjacent structures. Specific risks included nondiagnostic and nontherapeutic injection, non target injection,  worsening of back pain.  The skin was prepped and draped in the usual sterile fashion. Under fluoroscopic guidance a curved 22 gauge 3 1/2-inch spinal needle was advanced into the facet joint at L4-L5 and L5-S1 on the LEFT. Injection of 0.50 ml of Omnipaque 180 was performed which showed intra-articular needle placement. No vascular uptake was observed. Subsequently, a small amount of 0.25% sensorcaine, estimated at 0.5 ml with 60 mg of Depo-Medrol was injected into each facet joint. Concordant LEFT lower extremity radiculopathy was experienced with LEFT facet joint injection.  The patient tolerated the procedure well and there were no complications. The patient was observed in the holding room for 30 minutes prior to discharge.  IMPRESSION: Technically successful LEFT L4-L5 and LEFT L5-S1 facet injections with steroid and anesthetic. The patient could be considered for repeat facet injections in 2-3 months if 50% pain relief from today's injections for 6 weeks.   Electronically Signed   By: Dereck Ligas M.D.   On: 05/19/2015 11:36    Oren Binet, MD  [facility] Pager:[phone]  If 7PM-7AM, please contact night-coverage [email or web address] Password [email or web address] 06/11/2015, 2:20 PM   LOS: 1 day

## 2015-06-11 NOTE — Progress Notes (Signed)
Patient Name: Hunter Sparks Date of Encounter: 06/11/2015  Principal Problem:   Angina, class II Active Problems:   Diabetes mellitus type 2, controlled   Hypothyroidism   History of tobacco use   Chronic low back pain   Primary Cardiologist: Dr Irish Lack  Patient Profile: 69 yo male w/ DM, depression, no HTN, HL, admitted 07/28 w/ chest pain, cards consulted, cath today.  SUBJECTIVE: No chest pain or SOB now.  OBJECTIVE Filed Vitals:   06/11/15 0000 06/11/15 0107 06/11/15 0553 06/11/15 0730  BP:  120/71    Pulse:  69    Temp: 97.8 F (36.6 C)  97.5 F (36.4 C) 97.7 F (36.5 C)  TempSrc: Oral  Oral Oral  Resp:  14    Height:      Weight:   175 lb 1.6 oz (79.425 kg)   SpO2:  96%      Intake/Output Summary (Last 24 hours) at 06/11/15 0918 Last data filed at 06/11/15 7846  Gross per 24 hour  Intake 217.89 ml  Output      0 ml  Net 217.89 ml   Filed Weights   06/10/15 1736 06/11/15 0553  Weight: 178 lb (80.74 kg) 175 lb 1.6 oz (79.425 kg)    PHYSICAL EXAM General: Well developed, well nourished, male in no acute distress. Head: Normocephalic, atraumatic.  Neck: Supple without bruits, JVD not elevated. Lungs:  Resp regular and unlabored, CTA. Heart: RRR, S1, S2, no S3, S4, or murmur; no rub. Abdomen: Soft, non-tender, non-distended, BS + x 4.  Extremities: No clubbing, cyanosis, edema.  Neuro: Alert and oriented X 3. Moves all extremities spontaneously. Psych: Normal affect.  LABS: CBC: Recent Labs  06/10/15 1237  WBC 6.1  NEUTROABS 3.6  HGB 14.8  HCT 43.0  MCV 90.3  PLT 167   INR: Recent Labs  06/11/15 0431  INR 9.62   Basic Metabolic Panel: Recent Labs  06/10/15 1237  NA 139  K 4.5  CL 104  CO2 29  GLUCOSE 186*  BUN 10  CREATININE 1.09  CALCIUM 9.4   Liver Function Tests: pending  Cardiac Enzymes: Recent Labs  06/10/15 1237 06/10/15 1941 06/11/15 0130  TROPONINI <0.03 <0.03 <0.03    Recent Labs  06/10/15 1246    TROPIPOC 0.00   Hemoglobin A1C: Recent Labs  06/10/15 1542  HGBA1C 7.7*   Fasting Lipid Panel: Recent Labs  06/11/15 0130  CHOL 222*  HDL 37*  LDLCALC 161*  TRIG 118  CHOLHDL 6.0    TELE:  SR, ST       Radiology/Studies: Dg Chest 2 View  06/10/2015   CLINICAL DATA:  Chest pain for 2 weeks  EXAM: CHEST - 2 VIEW  COMPARISON:  None.  FINDINGS: Cardiac shadow is within normal limits. The lungs are well aerated bilaterally. No focal infiltrate, effusion or pneumothorax is noted. Mild degenerative change of the thoracic spine is noted. No acute abnormality is noted.  IMPRESSION: No active disease.   Electronically Signed   By: Inez Catalina M.D.   On: 06/10/2015 13:10     Current Medications:  . aspirin EC  325 mg Oral Daily  . enoxaparin (LOVENOX) injection  40 mg Subcutaneous Q24H  . insulin aspart  0-15 Units Subcutaneous TID WC  . insulin aspart  0-5 Units Subcutaneous QHS  . levothyroxine  75 mcg Oral QAC breakfast  . lisinopril  2.5 mg Oral QHS  . metoprolol tartrate  25 mg Oral BID  .  nitroGLYCERIN  0.5 inch Topical 4 times per day  . sodium chloride  3 mL Intravenous Q12H   . sodium chloride 1 mL/kg/hr (06/11/15 0832)    ASSESSMENT AND PLAN: Principal Problem:   Angina, class II - ez neg MI, for cath this pm - no chest pain or SOB now. - On ASA, BB, will add statin    Hyperlipidemia - add Lipitor 40 mg, ck LFTs  Otherwise, per IM Active Problems:   Diabetes mellitus type 2, controlled   Hypothyroidism   History of tobacco use - quit 20 yrs   Chronic low back pain   Signed, Barrett, Rhonda , PA-C 9:18 AM 06/11/2015   I have examined the patient and reviewed assessment and plan and discussed with patient.  Agree with above as stated.  Accelerating angina.  Cath today. All questions answered.  Oswin Griffith S.

## 2015-06-11 NOTE — Interval H&P Note (Signed)
Cath Lab Visit (complete for each Cath Lab visit)  Clinical Evaluation Leading to the Procedure:   ACS: No.  Non-ACS:    Anginal Classification: CCS II  Anti-ischemic medical therapy: Minimal Therapy (1 class of medications)  Non-Invasive Test Results: No non-invasive testing performed  Prior CABG: No previous CABG      History and Physical Interval Note:  06/11/2015 2:50 PM  Hunter Sparks  has presented today for surgery, with the diagnosis of Unstable Angina  The various methods of treatment have been discussed with the patient and family. After consideration of risks, benefits and other options for treatment, the patient has consented to  Procedure(s): Left Heart Cath and Coronary Angiography (N/A) as a surgical intervention .  The patient's history has been reviewed, patient examined, no change in status, stable for surgery.  I have reviewed the patient's chart and labs.  Questions were answered to the patient's satisfaction.     Sinclair Grooms

## 2015-06-11 NOTE — Telephone Encounter (Signed)
Opened encounter for phone call

## 2015-06-11 NOTE — H&P (View-Only) (Signed)
Patient Name: Hunter Sparks Date of Encounter: 06/11/2015  Principal Problem:   Angina, class II Active Problems:   Diabetes mellitus type 2, controlled   Hypothyroidism   History of tobacco use   Chronic low back pain   Primary Cardiologist: Dr Irish Lack  Patient Profile: 69 yo male w/ DM, depression, no HTN, HL, admitted 07/28 w/ chest pain, cards consulted, cath today.  SUBJECTIVE: No chest pain or SOB now.  OBJECTIVE Filed Vitals:   06/11/15 0000 06/11/15 0107 06/11/15 0553 06/11/15 0730  BP:  120/71    Pulse:  69    Temp: 97.8 F (36.6 C)  97.5 F (36.4 C) 97.7 F (36.5 C)  TempSrc: Oral  Oral Oral  Resp:  14    Height:      Weight:   175 lb 1.6 oz (79.425 kg)   SpO2:  96%      Intake/Output Summary (Last 24 hours) at 06/11/15 0918 Last data filed at 06/11/15 5465  Gross per 24 hour  Intake 217.89 ml  Output      0 ml  Net 217.89 ml   Filed Weights   06/10/15 1736 06/11/15 0553  Weight: 178 lb (80.74 kg) 175 lb 1.6 oz (79.425 kg)    PHYSICAL EXAM General: Well developed, well nourished, male in no acute distress. Head: Normocephalic, atraumatic.  Neck: Supple without bruits, JVD not elevated. Lungs:  Resp regular and unlabored, CTA. Heart: RRR, S1, S2, no S3, S4, or murmur; no rub. Abdomen: Soft, non-tender, non-distended, BS + x 4.  Extremities: No clubbing, cyanosis, edema.  Neuro: Alert and oriented X 3. Moves all extremities spontaneously. Psych: Normal affect.  LABS: CBC: Recent Labs  06/10/15 1237  WBC 6.1  NEUTROABS 3.6  HGB 14.8  HCT 43.0  MCV 90.3  PLT 167   INR: Recent Labs  06/11/15 0431  INR 0.35   Basic Metabolic Panel: Recent Labs  06/10/15 1237  NA 139  K 4.5  CL 104  CO2 29  GLUCOSE 186*  BUN 10  CREATININE 1.09  CALCIUM 9.4   Liver Function Tests: pending  Cardiac Enzymes: Recent Labs  06/10/15 1237 06/10/15 1941 06/11/15 0130  TROPONINI <0.03 <0.03 <0.03    Recent Labs  06/10/15 1246    TROPIPOC 0.00   Hemoglobin A1C: Recent Labs  06/10/15 1542  HGBA1C 7.7*   Fasting Lipid Panel: Recent Labs  06/11/15 0130  CHOL 222*  HDL 37*  LDLCALC 161*  TRIG 118  CHOLHDL 6.0    TELE:  SR, ST       Radiology/Studies: Dg Chest 2 View  06/10/2015   CLINICAL DATA:  Chest pain for 2 weeks  EXAM: CHEST - 2 VIEW  COMPARISON:  None.  FINDINGS: Cardiac shadow is within normal limits. The lungs are well aerated bilaterally. No focal infiltrate, effusion or pneumothorax is noted. Mild degenerative change of the thoracic spine is noted. No acute abnormality is noted.  IMPRESSION: No active disease.   Electronically Signed   By: Inez Catalina M.D.   On: 06/10/2015 13:10     Current Medications:  . aspirin EC  325 mg Oral Daily  . enoxaparin (LOVENOX) injection  40 mg Subcutaneous Q24H  . insulin aspart  0-15 Units Subcutaneous TID WC  . insulin aspart  0-5 Units Subcutaneous QHS  . levothyroxine  75 mcg Oral QAC breakfast  . lisinopril  2.5 mg Oral QHS  . metoprolol tartrate  25 mg Oral BID  .  nitroGLYCERIN  0.5 inch Topical 4 times per day  . sodium chloride  3 mL Intravenous Q12H   . sodium chloride 1 mL/kg/hr (06/11/15 0832)    ASSESSMENT AND PLAN: Principal Problem:   Angina, class II - ez neg MI, for cath this pm - no chest pain or SOB now. - On ASA, BB, will add statin    Hyperlipidemia - add Lipitor 40 mg, ck LFTs  Otherwise, per IM Active Problems:   Diabetes mellitus type 2, controlled   Hypothyroidism   History of tobacco use - quit 20 yrs   Chronic low back pain   Signed, Barrett, Rhonda , PA-C 9:18 AM 06/11/2015   I have examined the patient and reviewed assessment and plan and discussed with patient.  Agree with above as stated.  Accelerating angina.  Cath today. All questions answered.  VARANASI,JAYADEEP S.

## 2015-06-12 ENCOUNTER — Inpatient Hospital Stay (HOSPITAL_COMMUNITY): Payer: Medicare Other

## 2015-06-12 DIAGNOSIS — R079 Chest pain, unspecified: Secondary | ICD-10-CM

## 2015-06-12 DIAGNOSIS — I208 Other forms of angina pectoris: Secondary | ICD-10-CM

## 2015-06-12 DIAGNOSIS — I25110 Atherosclerotic heart disease of native coronary artery with unstable angina pectoris: Secondary | ICD-10-CM

## 2015-06-12 LAB — BLOOD GAS, ARTERIAL
Acid-Base Excess: 2.2 mmol/L — ABNORMAL HIGH (ref 0.0–2.0)
Bicarbonate: 26.2 mEq/L — ABNORMAL HIGH (ref 20.0–24.0)
Drawn by: 129711
FIO2: 0.21
O2 Saturation: 97.8 %
Patient temperature: 98.6
TCO2: 27.5 mmol/L (ref 0–100)
pCO2 arterial: 41 mmHg (ref 35.0–45.0)
pH, Arterial: 7.422 (ref 7.350–7.450)
pO2, Arterial: 90.6 mmHg (ref 80.0–100.0)

## 2015-06-12 LAB — BASIC METABOLIC PANEL
Anion gap: 4 — ABNORMAL LOW (ref 5–15)
BUN: 12 mg/dL (ref 6–20)
CALCIUM: 8.9 mg/dL (ref 8.9–10.3)
CO2: 28 mmol/L (ref 22–32)
Chloride: 108 mmol/L (ref 101–111)
Creatinine, Ser: 1.07 mg/dL (ref 0.61–1.24)
GFR calc Af Amer: >60 mL/min (ref >60)
GFR calc non Af Amer: >60 mL/min (ref >60)
GLUCOSE: 113 mg/dL — AB (ref 65–99)
Potassium: 4.3 mmol/L (ref 3.5–5.1)
SODIUM: 140 mmol/L (ref 135–145)

## 2015-06-12 LAB — HEPARIN LEVEL (UNFRACTIONATED)
HEPARIN UNFRACTIONATED: 0.6 [IU]/mL (ref 0.30–0.70)
Heparin Unfractionated: <0.1 IU/mL — ABNORMAL LOW (ref 0.30–0.70)
Heparin Unfractionated: 1.08 IU/mL — ABNORMAL HIGH (ref 0.30–0.70)

## 2015-06-12 LAB — GLUCOSE, CAPILLARY
GLUCOSE-CAPILLARY: 136 mg/dL — AB (ref 65–99)
Glucose-Capillary: 131 mg/dL — ABNORMAL HIGH (ref 65–99)
Glucose-Capillary: 152 mg/dL — ABNORMAL HIGH (ref 65–99)
Glucose-Capillary: 212 mg/dL — ABNORMAL HIGH (ref 65–99)

## 2015-06-12 MED ORDER — DIAZEPAM 5 MG PO TABS
5.0000 mg | ORAL_TABLET | Freq: Once | ORAL | Status: AC
  Administered 2015-06-14: 5 mg via ORAL
  Filled 2015-06-12: qty 1

## 2015-06-12 MED ORDER — BISACODYL 5 MG PO TBEC
5.0000 mg | DELAYED_RELEASE_TABLET | Freq: Once | ORAL | Status: AC
  Administered 2015-06-13: 5 mg via ORAL
  Filled 2015-06-12: qty 1

## 2015-06-12 MED ORDER — DIAZEPAM 5 MG PO TABS
5.0000 mg | ORAL_TABLET | ORAL | Status: DC | PRN
  Administered 2015-06-12 – 2015-06-13 (×2): 10 mg via ORAL
  Administered 2015-06-13: 5 mg via ORAL
  Filled 2015-06-12 (×3): qty 2

## 2015-06-12 MED ORDER — CHLORHEXIDINE GLUCONATE 4 % EX LIQD
60.0000 mL | Freq: Once | CUTANEOUS | Status: AC
  Administered 2015-06-13: 4 via TOPICAL
  Filled 2015-06-12: qty 60

## 2015-06-12 MED ORDER — HEPARIN (PORCINE) IN NACL 100-0.45 UNIT/ML-% IJ SOLN
950.0000 [IU]/h | INTRAMUSCULAR | Status: DC
  Administered 2015-06-13: 950 [IU]/h via INTRAVENOUS
  Filled 2015-06-12: qty 250

## 2015-06-12 MED ORDER — TEMAZEPAM 15 MG PO CAPS
15.0000 mg | ORAL_CAPSULE | Freq: Once | ORAL | Status: AC | PRN
  Administered 2015-06-13: 15 mg via ORAL
  Filled 2015-06-12: qty 1

## 2015-06-12 MED ORDER — METOPROLOL TARTRATE 12.5 MG HALF TABLET: 12.5000 mg | ORAL_TABLET | Freq: Once | ORAL | Status: DC

## 2015-06-12 MED ORDER — CHLORHEXIDINE GLUCONATE 4 % EX LIQD
60.0000 mL | Freq: Once | CUTANEOUS | Status: AC
  Administered 2015-06-14: 4 via TOPICAL
  Filled 2015-06-12: qty 60

## 2015-06-12 NOTE — Progress Notes (Signed)
PATIENT DETAILS Name: Hunter Sparks Age: 69 y.o. Sex: male Date of Birth: 12-03-1945 Admit Date: 06/10/2015 Admitting Physician Truett Mainland, DO TTS:[id number], MD  Subjective: No chest pain during my rounds this morning.  Assessment/Plan: Principal Problem: Suspected unstable angina:  Currently chest pain-free, troponins negative. Continue aspirin, statin and metoprolol. Evaluated by cardiology, underwent cardiac catheterization-showed triple-vessel disease-scheduled for CABG on 8/1.  Active Problems: Diabetes mellitus type 2, controlled: CBGs stable with SSI. Metformin remains on hold. A1c at 7.7  Hypothyroidism: Continue with levothyroxine  Hypertension: Stable-continue with metoprolol and lisinopril  Disposition: Remain inpatient  Antimicrobial agents  See below  Anti-infectives    None      DVT Prophylaxis: Prophylactic Lovenox   Code Status: Full code   Family Communication Family member at bedside  Procedures: None  CONSULTS:  cardiology  Time spent 25 minutes-Greater than 50% of this time was spent in counseling, explanation of diagnosis, planning of further management, and coordination of care.  MEDICATIONS: Scheduled Meds: . aspirin EC  325 mg Oral Daily  . atorvastatin  40 mg Oral q1800  . insulin aspart  0-15 Units Subcutaneous TID WC  . insulin aspart  0-5 Units Subcutaneous QHS  . levothyroxine  75 mcg Oral QAC breakfast  . lisinopril  2.5 mg Oral QHS  . metoprolol tartrate  25 mg Oral BID  . sodium chloride  3 mL Intravenous Q12H   Continuous Infusions: . heparin 1,200 Units/hr (06/11/15 2333)  . nitroGLYCERIN 10 mcg/min (06/11/15 1738)   PRN Meds:.sodium chloride, acetaminophen, diazepam, morphine injection, ondansetron (ZOFRAN) IV, oxyCODONE-acetaminophen, sodium chloride, traMADol    PHYSICAL EXAM: Vital signs in last 24 hours: Filed Vitals:   06/11/15 2047 06/12/15 0611 06/12/15 1022 06/12/15  1401  BP: 93/54 98/59 102/62 100/59  Pulse: 63 63 76 57  Temp: 98.2 F (36.8 C) 97.7 F (36.5 C)  97.7 F (36.5 C)  TempSrc: Oral Oral  Oral  Resp: 18 18  18   Height:      Weight:  80.831 kg (178 lb 3.2 oz)    SpO2: 95% 98%  97%    Weight change: 0.091 kg (3.2 oz) Filed Weights   06/10/15 1736 06/11/15 0553 06/12/15 0611  Weight: 80.74 kg (178 lb) 79.425 kg (175 lb 1.6 oz) 80.831 kg (178 lb 3.2 oz)   Body mass index is 24.16 kg/(m^2).   Gen Exam: Awake and alert with clear speech.   Neck: Supple, No JVD.   Chest: B/L Clear.   CVS: S1 S2 Regular, no murmurs.  Abdomen: soft, BS +, non tender, non distended.  Extremities: no edema, lower extremities warm to touch. Neurologic: Non Focal.   Skin: No Rash.   Wounds: N/A.   Intake/Output from previous day:  Intake/Output Summary (Last 24 hours) at 06/12/15 1406 Last data filed at 06/12/15 1021  Gross per 24 hour  Intake 262.93 ml  Output      0 ml  Net 262.93 ml     LAB RESULTS: CBC  Recent Labs Lab 06/10/15 1237  WBC 6.1  HGB 14.8  HCT 43.0  PLT 167  MCV 90.3  MCH 31.1  MCHC 34.4  RDW 12.5  LYMPHSABS 1.7  MONOABS 0.6  EOSABS 0.2  BASOSABS 0.0    Chemistries   Recent Labs Lab 06/10/15 1237 06/12/15 0304  NA 139 140  K 4.5 4.3  CL 104 108  CO2 29  28  GLUCOSE 186* 113*  BUN 10 12  CREATININE 1.09 1.07  CALCIUM 9.4 8.9    CBG:  Recent Labs Lab 06/11/15 1140 06/11/15 1627 06/11/15 2111 06/12/15 0742 06/12/15 1136  GLUCAP 101* 111* 227* 131* 136*    GFR Estimated Creatinine Clearance: 70.5 mL/min (by C-G formula based on Cr of 1.07).  Coagulation profile  Recent Labs Lab 06/11/15 0431  INR 1.01    Cardiac Enzymes  Recent Labs Lab 06/10/15 1237 06/10/15 1941 06/11/15 0130  TROPONINI <0.03 <0.03 <0.03    Invalid input(s): POCBNP No results for input(s): DDIMER in the last 72 hours.  Recent Labs  06/10/15 1542  HGBA1C 7.7*    Recent Labs  06/11/15 0130  CHOL  222*  HDL 37*  LDLCALC 161*  TRIG 118  CHOLHDL 6.0   No results for input(s): TSH, T4TOTAL, T3FREE, THYROIDAB in the last 72 hours.  Invalid input(s): FREET3 No results for input(s): VITAMINB12, FOLATE, FERRITIN, TIBC, IRON, RETICCTPCT in the last 72 hours. No results for input(s): LIPASE, AMYLASE in the last 72 hours.  Urine Studies No results for input(s): UHGB, CRYS in the last 72 hours.  Invalid input(s): UACOL, UAPR, USPG, UPH, UTP, UGL, UKET, UBIL, UNIT, UROB, ULEU, UEPI, UWBC, URBC, UBAC, CAST, UCOM, BILUA  MICROBIOLOGY: Recent Results (from the past 240 hour(s))  MRSA PCR Screening     Status: None   Collection Time: 06/10/15  6:04 PM  Result Value Ref Range Status   MRSA by PCR NEGATIVE NEGATIVE Final    Comment:        The GeneXpert MRSA Assay (FDA approved for NASAL specimens only), is one component of a comprehensive MRSA colonization surveillance program. It is not intended to diagnose MRSA infection nor to guide or monitor treatment for MRSA infections.     RADIOLOGY STUDIES/RESULTS: Dg Chest 2 View  06/10/2015   CLINICAL DATA:  Chest pain for 2 weeks  EXAM: CHEST - 2 VIEW  COMPARISON:  None.  FINDINGS: Cardiac shadow is within normal limits. The lungs are well aerated bilaterally. No focal infiltrate, effusion or pneumothorax is noted. Mild degenerative change of the thoracic spine is noted. No acute abnormality is noted.  IMPRESSION: No active disease.   Electronically Signed   By: Inez Catalina M.D.   On: 06/10/2015 13:10   Dg Facet Jt Inj C/t Single Level Left W/fl/ct  05/19/2015   CLINICAL DATA:  Chronic lumbago/low back pain. Mid back pain radiating to the anterior and posterior leg on the LEFT. 75% relief with last facet injection for 3 months.  EXAM: LUMBAR FACET INJECTION, LEFT L4-L5 and LEFT L5-S1  FLUOROSCOPY TIME:  0 minutes 59 seconds  Dose: 0.353 Gycm2  COMPARISON:  01/29/2015.  PROCEDURE: Consent was obtained after a discussion of risks and benefits  of the procedure. General risks included bleeding, infection, injury to nerves, blood vessels, and adjacent structures. Specific risks included nondiagnostic and nontherapeutic injection, non target injection, worsening of back pain.  The skin was prepped and draped in the usual sterile fashion. Under fluoroscopic guidance a curved 22 gauge 3 1/2-inch spinal needle was advanced into the facet joint at L4-L5 and L5-S1 on the LEFT. Injection of 0.50 ml of Omnipaque 180 was performed which showed intra-articular needle placement. No vascular uptake was observed. Subsequently, a small amount of 0.25% sensorcaine, estimated at 0.5 ml with 60 mg of Depo-Medrol was injected into each facet joint. Concordant LEFT lower extremity radiculopathy was experienced with LEFT facet joint injection.  The patient tolerated the procedure well and there were no complications. The patient was observed in the holding room for 30 minutes prior to discharge.  IMPRESSION: Technically successful LEFT L4-L5 and LEFT L5-S1 facet injections with steroid and anesthetic. The patient could be considered for repeat facet injections in 2-3 months if 50% pain relief from today's injections for 6 weeks.   Electronically Signed   By: Dereck Ligas M.D.   On: 05/19/2015 11:36   Dg Facet Jt Inj L /s  2nd Level Left W/fl/ct  05/19/2015   CLINICAL DATA:  Chronic lumbago/low back pain. Mid back pain radiating to the anterior and posterior leg on the LEFT. 75% relief with last facet injection for 3 months.  EXAM: LUMBAR FACET INJECTION, LEFT L4-L5 and LEFT L5-S1  FLUOROSCOPY TIME:  0 minutes 59 seconds  Dose: 0.353 Gycm2  COMPARISON:  01/29/2015.  PROCEDURE: Consent was obtained after a discussion of risks and benefits of the procedure. General risks included bleeding, infection, injury to nerves, blood vessels, and adjacent structures. Specific risks included nondiagnostic and nontherapeutic injection, non target injection, worsening of back pain.  The  skin was prepped and draped in the usual sterile fashion. Under fluoroscopic guidance a curved 22 gauge 3 1/2-inch spinal needle was advanced into the facet joint at L4-L5 and L5-S1 on the LEFT. Injection of 0.50 ml of Omnipaque 180 was performed which showed intra-articular needle placement. No vascular uptake was observed. Subsequently, a small amount of 0.25% sensorcaine, estimated at 0.5 ml with 60 mg of Depo-Medrol was injected into each facet joint. Concordant LEFT lower extremity radiculopathy was experienced with LEFT facet joint injection.  The patient tolerated the procedure well and there were no complications. The patient was observed in the holding room for 30 minutes prior to discharge.  IMPRESSION: Technically successful LEFT L4-L5 and LEFT L5-S1 facet injections with steroid and anesthetic. The patient could be considered for repeat facet injections in 2-3 months if 50% pain relief from today's injections for 6 weeks.   Electronically Signed   By: Dereck Ligas M.D.   On: 05/19/2015 11:36    Oren Binet, MD  [facility] Pager:[phone]  If 7PM-7AM, please contact night-coverage [email or web address] Password [email or web address] 06/12/2015, 2:06 PM   LOS: 2 days

## 2015-06-12 NOTE — Progress Notes (Signed)
Phase I Cardiac Rehab  Pre OHS education performed. Pt and son verbalized understanding.  Preparing for Heart surgery booklet given, pt/son instructed in how to watch Pre OHS video.  Pt declined offer for ambulation at this time, c/o fatigue.   Pt states he would ambulate with staff later today.

## 2015-06-12 NOTE — Progress Notes (Addendum)
ANTICOAGULATION CONSULT NOTE - Initial Consult  Pharmacy Consult for Heparin Indication: chest pain/ACS  Allergies  Allergen Reactions  . Aspirin Itching    Patient received ASA 324mg  PO, chewed, in ER on 06/10/2015 without any specific reaction.  Re-coded as intolerance, not allergy.  . Penicillins Rash    Patient Measurements: Height: 6' (182.9 cm) Weight: 178 lb 3.2 oz (80.831 kg) IBW/kg (Calculated) : 77.6  Vital Signs: Temp: 97.7 F (36.5 C) (07/30 0611) Temp Source: Oral (07/30 0611) BP: 98/59 mmHg (07/30 0611) Pulse Rate: 63 (07/30 0611)  Labs:  Recent Labs  06/10/15 1237 06/10/15 1941 06/11/15 0130 06/11/15 0431 06/12/15 0304 06/12/15 0746  HGB 14.8  --   --   --   --   --   HCT 43.0  --   --   --   --   --   PLT 167  --   --   --   --   --   LABPROT  --   --   --  13.5  --   --   INR  --   --   --  1.01  --   --   HEPARINUNFRC  --   --   --   --   --  0.60  CREATININE 1.09  --   --   --  1.07  --   TROPONINI <0.03 <0.03 <0.03  --   --   --     Estimated Creatinine Clearance: 70.5 mL/min (by C-G formula based on Cr of 1.07).   Medical History: Past Medical History  Diagnosis Date  . Kidney stone   . Type II diabetes mellitus   . Hypertension   . Hypothyroidism   . Arthritis     "back, hips, legs" (06/10/2015)  . DJD (degenerative joint disease)   . Chronic lower back pain   . Hyperlipidemia 06/11/2015    Assessment: 69 year old male admitted with chest pain, now s/p cath, pharmacy asked to begin heparin 8 hours after sheath removal due to severe 3 vessel  CAD.  CVTS consult pending. HL therapeutic x1.  Goal of Therapy:  Heparin level 0.3-0.7 units/ml Monitor platelets by anticoagulation protocol: Yes   Plan:  Continue heparin drip at 1200 units / hr Daily heparin level, CBC Check confirmatory level this evening    Hughes Better, PharmD, BCPS Clinical Pharmacist Pager: [phone] 06/12/2015 8:46 AM    Addendum -Afternoon HL returned  undetectable, this is unusual given the level was therapeutic at the same rate this morning -I spoke with the RN and verified that the heparin has been infusing without any problems -Increase heparin to 1400 units/hr -Check level this evening -CABG Monday   Harvel Quale  06/12/2015 3:04 PM

## 2015-06-12 NOTE — Progress Notes (Signed)
VASCULAR LAB PRELIMINARY  PRELIMINARY  PRELIMINARY  PRELIMINARY  Pre-op Cardiac Surgery  Carotid Findings:  Bilateral:  1-39% ICA stenosis.  Vertebral artery flow is antegrade.     Upper Extremity Right Left  Brachial Pressures 119 Triphasic 125 Triphasic  Radial Waveforms Triphasic Triphasic  Ulnar Waveforms Triphasic Trihasic  Palmar Arch (Allen's Test) Normal Normal   Findings: Doppler waveforms remained normal bilaterally with both radial and ulnar compressions     Lower  Extremity Right Left  Dorsalis Pedis 131 Triphasic 128 Triphasic  Posterior Tibial 152 Biphasic 143 Triphasic  Ankle/Brachial Indices 1.22 1.14    Findings:  ABIs and Doppler waveforms are within normal limits bilaterally at rest   Hunter Sparks, RVS 06/12/2015, 5:34 PM

## 2015-06-12 NOTE — Consult Note (Signed)
[address]       [place] [address]             [phone]        Tiran L Kluttz [facility] Medical Record #[id number] Date of Birth: 1946/08/07  Referring: No ref. provider found Primary Care: Alonza Bogus, MD  Chief Complaint:    Chief Complaint  Patient presents with  . Chest Pain  patient examined, coronary angiogram independently reviewed and discussed with patient. 2-D echocardiogram pending.   History of Present Illness:     69 year old Caucasian diabetic ex--smoker presents with symptoms of unstable angina and negative cardiac enzymes.Marland Kitchen He required IV nitroglycerin to control his chest pain. Cardiac catheterization performed by Dr. Tamala Julian demonstrates severe multivessel coronary disease with preserved LV systolic function.LVEDP 8 mmHg. No evidence of aortic stenosis or mitral regurgitation. Echocardiogram is pending. Carotid Dopplers are pending. The patient is currently pain-free on IV nitroglycerin. Cardiac catheterization was performed via right radial artery and there is no evidence of hematoma or postprocedural bleeding. Patient's risk factors include history smoking, diabetes, hyperlipidemia,hypertension.   Current Activity/ Functional Status: Patient is retired as a Development worker, community. He was with family. He remains fairly active until recently.   Zubrod Score: At the time of surgery this patient's most appropriate activity status/level should be described as: []     0    Normal activity, no symptoms []     1    Restricted in physical strenuous activity but ambulatory, able to do out light work [x]     2    Ambulatory and capable of self care, unable to do work activities, up and about                 more than 50%  Of the time                            []     3    Only limited self care, in bed greater than 50% of waking hours []     4    Completely disabled, no self care, confined to bed or chair []     5    Moribund  Past Medical  History  Diagnosis Date  . Kidney stone   . Type II diabetes mellitus   . Hypertension   . Hypothyroidism   . Arthritis     "back, hips, legs" (06/10/2015)  . DJD (degenerative joint disease)   . Chronic lower back pain   . Hyperlipidemia 06/11/2015    Past Surgical History  Procedure Laterality Date  . Dupuytren contracture release Bilateral 2000's  . Colonoscopy  06/21/2011    Procedure: COLONOSCOPY;  Surgeon: Rogene Houston, MD;  Location: [facility];  Service: Endoscopy;  Laterality: N/A;  . Skin cancer excision Left     "forearm"    History  Smoking status  . Former Smoker -- 3.00 packs/day for 47 years  . Types: Cigarettes  . Quit date: 06/21/2003  Smokeless tobacco  . Current User  . Types: Chew    History  Alcohol Use No    History   Social History  . Marital Status: Married    Spouse Name: N/A  . Number of Children: N/A  . Years of Education: N/A   Occupational History  . Not on file.   Social History Main Topics  . Smoking status: Former Smoker -- 3.00 packs/day for 47  years    Types: Cigarettes    Quit date: 06/21/2003  . Smokeless tobacco: Current User    Types: Chew  . Alcohol Use: No  . Drug Use: No  . Sexual Activity: No   Other Topics Concern  . Not on file   Social History Narrative    Allergies  Allergen Reactions  . Aspirin Itching    Patient received ASA 324mg  PO, chewed, in ER on 06/10/2015 without any specific reaction.  Re-coded as intolerance, not allergy.  . Penicillins Rash    Current Facility-Administered Medications  Medication Dose Route Frequency Provider Last Rate Last Dose  . 0.9 %  sodium chloride infusion  250 mL Intravenous PRN Belva Crome, MD 10 mL/hr at 06/11/15 1900 250 mL at 06/11/15 1900  . acetaminophen (TYLENOL) tablet 650 mg  650 mg Oral Q4H PRN Belva Crome, MD   650 mg at 06/12/15 1020  . aspirin EC tablet 325 mg  325 mg Oral Daily Samella Parr, NP   325 mg at 06/12/15 1021  . atorvastatin  (LIPITOR) tablet 40 mg  40 mg Oral q1800 Rhonda G Barrett, PA-C   40 mg at 06/11/15 1800  . heparin ADULT infusion 100 units/mL (25000 units/250 mL)  1,200 Units/hr Intravenous Continuous Jonetta Osgood, MD 12 mL/hr at 06/11/15 2333 1,200 Units/hr at 06/11/15 2333  . insulin aspart (novoLOG) injection 0-15 Units  0-15 Units Subcutaneous TID WC Samella Parr, NP   3 Units at 06/12/15 0831  . insulin aspart (novoLOG) injection 0-5 Units  0-5 Units Subcutaneous QHS Samella Parr, NP   2 Units at 06/11/15 2136  . levothyroxine (SYNTHROID, LEVOTHROID) tablet 75 mcg  75 mcg Oral QAC breakfast Ricka Burdock, RPH   75 mcg at 06/12/15 0559  . lisinopril (PRINIVIL,ZESTRIL) tablet 2.5 mg  2.5 mg Oral QHS Samella Parr, NP   2.5 mg at 06/11/15 2128  . metoprolol tartrate (LOPRESSOR) tablet 25 mg  25 mg Oral BID Samella Parr, NP   25 mg at 06/11/15 2128  . morphine 2 MG/ML injection 2 mg  2 mg Intravenous Q2H PRN Samella Parr, NP   2 mg at 06/10/15 2149  . nitroGLYCERIN 50 mg in dextrose 5 % 250 mL (0.2 mg/mL) infusion  0-200 mcg/min Intravenous Continuous Belva Crome, MD 3 mL/hr at 06/11/15 1738 10 mcg/min at 06/11/15 1738  . ondansetron (ZOFRAN) injection 4 mg  4 mg Intravenous Q6H PRN Samella Parr, NP      . oxyCODONE-acetaminophen (PERCOCET/ROXICET) 5-325 MG per tablet 1-2 tablet  1-2 tablet Oral Q4H PRN Belva Crome, MD      . sodium chloride 0.9 % injection 3 mL  3 mL Intravenous Q12H Belva Crome, MD   3 mL at 06/12/15 1021  . sodium chloride 0.9 % injection 3 mL  3 mL Intravenous PRN Belva Crome, MD      . traMADol Veatrice Bourbon) tablet 50 mg  50 mg Oral Q6H PRN Samella Parr, NP   50 mg at 06/10/15 2145    Prescriptions prior to admission  Medication Sig Dispense Refill Last Dose  . levothyroxine (SYNTHROID, LEVOTHROID) 75 MCG tablet Take 75 mcg by mouth daily.   06/09/2015 at Unknown time  . lisinopril (PRINIVIL,ZESTRIL) 2.5 MG tablet Take 2.5 mg by mouth at bedtime.   06/09/2015  at Unknown time  . metFORMIN (GLUCOPHAGE) 500 MG tablet Take 500 mg by mouth 2 (two) times daily  with a meal.     06/10/2015 at Unknown time  . NITROSTAT 0.4 MG SL tablet Take 0.4 mg by mouth once as needed. Chest pain   unk at unk  . traMADol (ULTRAM) 50 MG tablet Take 50 mg by mouth daily as needed. pain   06/09/2015 at Unknown time  . HYDROmorphone (DILAUDID) 4 MG tablet Take 0.5-1 tablets (2-4 mg total) by mouth every 4 (four) hours as needed for pain. (Patient not taking: Reported on 02/12/2015) 30 tablet 0 Not Taking at Unknown time  . naproxen sodium (ALEVE) 220 MG tablet Take 2 tablets every 12 hours until stone passes. (Patient not taking: Reported on 06/10/2015)   Not Taking at Unknown time  . Tamsulosin HCl (FLOMAX) 0.4 MG CAPS Take 1 capsule daily until stone passes. (Patient not taking: Reported on 02/12/2015) 15 capsule 0 Not Taking at Unknown time    History reviewed. No pertinent family history.   Review of Systems:   The patient has never been hospitalized overnight. He is right-hand dominant. He denies previous thoracic trauma or pneumothorax. He denies bleeding diathesis.     Cardiac Review of Systems: Y or N  Chest Pain [ yes, positive for resting chest pain   ]  Resting SOB [no   ] Exertional SOB  [ yes ]  Orthopnea [ no ]   Pedal Edema [no   ]    Palpitations [ no ] Syncope  [ no ]   Presyncope [no   ]  General Review of Systems: [Y] = yes [  ]=no Constitional: recent weight change [ no ]; anorexia [  ]; fatigue [  ]; nausea [  ]; night sweats [  ]; fever [  ]; or chills [  ]                                                               Dental: poor dentition[  ]; Last Dentist visit: full dental plates  Eye : blurred vision [  ]; diplopia [   ]; vision changes [  ];  Amaurosis fugax[  ]; Resp: cough [  ];  wheezing[  ];  hemoptysis[  ]; shortness of breath[  ]; paroxysmal nocturnal dyspnea[  ]; dyspnea on exertion[ mild ]; or orthopnea[  ];  GI:  gallstones[  ], vomiting[   ];  dysphagia[  ]; melena[  ];  hematochezia [  ]; heartburn[  ];   Hx of  Colonoscopy[yes -negative  ]; GU: kidney stones [  ]; hematuria[  ];   dysuria [  ];  nocturia[  ];  history of     obstruction [  ]; urinary frequency [  ]             Skin: rash, swelling[  ];, hair loss[  ];  peripheral edema[  ];  or itching[  ]; Musculosketetal: myalgias[  ];  joint swelling[  ];  joint erythema[  ];  joint pain[  ];  back pain[  ];  Heme/Lymph: bruising[  ];  bleeding[  ];  anemia[  ];  Neuro: TIA[  ];  headaches[  ];  stroke[  ];  vertigo[  ];  seizures[  ];   paresthesias[  ];  difficulty walking[  ];  Psych:depression[  ];  anxiety[  ];  Endocrine: diabetes[ yes-hemoglobin A1c 7.7 ];  thyroid dysfunction[  ];  Immunizations: Flu [  ]; Pneumococcal[  ];  Other:  Physical Exam: BP 98/59 mmHg  Pulse 63  Temp(Src) 97.7 F (36.5 C) (Oral)  Resp 18  Ht 6' (1.829 m)  Wt 178 lb 3.2 oz (80.831 kg)  BMI 24.16 kg/m2  SpO2 98%      Physical Exam  General: thin middle-aged Caucasian male no acute distress, family present in the room during exam HEENT: Normocephalic pupils equal , dentition adequate Neck: Supple without JVD, adenopathy, or bruit Chest: Clear to auscultation, symmetrical breath sounds, no rhonchi, no tenderness             or deformity Cardiovascular: Regular rate and rhythm, no murmur, no gallop, peripheral pulses             palpable in all extremities Abdomen:  Soft, nontender, no palpable mass or organomegaly Extremities: Warm, well-perfused, no clubbing cyanosis edema or tenderness,              no venous stasis changes of the legs Rectal/GU: Deferred Neuro: Grossly non--focal and symmetrical throughout Skin: Clean and dry without rash or ulceration   Diagnostic Studies & Laboratory data:   Coronary angiogram was reviewed and discussed with patient and family. Chest x-ray independently reviewed   Recent Radiology Findings:   Dg Chest 2 View  06/10/2015   CLINICAL  DATA:  Chest pain for 2 weeks  EXAM: CHEST - 2 VIEW  COMPARISON:  None.  FINDINGS: Cardiac shadow is within normal limits. The lungs are well aerated bilaterally. No focal infiltrate, effusion or pneumothorax is noted. Mild degenerative change of the thoracic spine is noted. No acute abnormality is noted.  IMPRESSION: No active disease.   Electronically Signed   By: Inez Catalina M.D.   On: 06/10/2015 13:10     I have independently reviewed the above radiologic studies.  Recent Lab Findings: Lab Results  Component Value Date   WBC 6.1 06/10/2015   HGB 14.8 06/10/2015   HCT 43.0 06/10/2015   PLT 167 06/10/2015   GLUCOSE 113* 06/12/2015   CHOL 222* 06/11/2015   TRIG 118 06/11/2015   HDL 37* 06/11/2015   LDLCALC 161* 06/11/2015   ALT 16* 06/11/2015   AST 20 06/11/2015   NA 140 06/12/2015   K 4.3 06/12/2015   CL 108 06/12/2015   CREATININE 1.07 06/12/2015   BUN 12 06/12/2015   CO2 28 06/12/2015   INR 1.01 06/11/2015   HGBA1C 7.7* 06/10/2015      Assessment / Plan:     69 year old Caucasian male with diabetes and history of smoking presents with unstable angina  and negative cardiac enzymes. Cardiac catheterization demonstrates severe three-vessel disease with preserved LV systolic function.  His best long-term therapy would be multivessel CABG. I discussed the procedure in detail with the patient and his family including indications benefits alternatives and risks. We will plan on proceeding with surgery on Monday, August 1. The patient demonstrates  his understanding, agrees to proceed with surgery.         @ME1 @ 06/12/2015 10:24 AM

## 2015-06-12 NOTE — Progress Notes (Signed)
ANTICOAGULATION CONSULT NOTE - Follow up [facility] for Heparin Indication: chest pain/ACS  Allergies  Allergen Reactions  . Aspirin Itching    Patient received ASA 324mg  PO, chewed, in ER on 06/10/2015 without any specific reaction.  Re-coded as intolerance, not allergy.  . Penicillins Rash    Patient Measurements: Height: 6' (182.9 cm) Weight: 178 lb 3.2 oz (80.831 kg) IBW/kg (Calculated) : 77.6  Vital Signs: Temp: 98.4 F (36.9 C) (07/30 2046) Temp Source: Oral (07/30 2046) BP: 110/66 mmHg (07/30 2046) Pulse Rate: 66 (07/30 2046)  Labs:  Recent Labs  06/10/15 1237 06/10/15 1941 06/11/15 0130 06/11/15 0431 06/12/15 0304 06/12/15 0746 06/12/15 1405 06/12/15 2033  HGB 14.8  --   --   --   --   --   --   --   HCT 43.0  --   --   --   --   --   --   --   PLT 167  --   --   --   --   --   --   --   LABPROT  --   --   --  13.5  --   --   --   --   INR  --   --   --  1.01  --   --   --   --   HEPARINUNFRC  --   --   --   --   --  0.60 <0.10* 1.08*  CREATININE 1.09  --   --   --  1.07  --   --   --   TROPONINI <0.03 <0.03 <0.03  --   --   --   --   --     Estimated Creatinine Clearance: 70.5 mL/min (by C-G formula based on Cr of 1.07).   Medical History: Past Medical History  Diagnosis Date  . Kidney stone   . Type II diabetes mellitus   . Hypertension   . Hypothyroidism   . Arthritis     "back, hips, legs" (06/10/2015)  . DJD (degenerative joint disease)   . Chronic lower back pain   . Hyperlipidemia 06/11/2015    Assessment: 69 year old male admitted with chest pain, now s/p cath, pharmacy asked to begin heparin 8 hours after sheath removal due to severe 3 vessel  CAD.  CVTS consult pending.  Heparin drip 1400 uts/hr HL elevated 1.08  Goal of Therapy:  Heparin level 0.3-0.7 units/ml Monitor platelets by anticoagulation protocol: Yes   Plan:  Hold x1hr then decrease  heparin drip back to 1200 units / hr Daily heparin level, CBC  Bonnita Nasuti Pharm.D. CPP, BCPS Clinical Pharmacist [phone] 06/12/2015 10:05 PM

## 2015-06-12 NOTE — Progress Notes (Signed)
  Echocardiogram 2D Echocardiogram has been performed.  Lysle Rubens 06/12/2015, 3:30 PM

## 2015-06-13 LAB — CBC
HCT: 39.2 % (ref 39.0–52.0)
Hemoglobin: 13.2 g/dL (ref 13.0–17.0)
MCH: 30.5 pg (ref 26.0–34.0)
MCHC: 33.7 g/dL (ref 30.0–36.0)
MCV: 90.5 fL (ref 78.0–100.0)
Platelets: 171 10*3/uL (ref 150–400)
RBC: 4.33 MIL/uL (ref 4.22–5.81)
RDW: 12.6 % (ref 11.5–15.5)
WBC: 6.9 10*3/uL (ref 4.0–10.5)

## 2015-06-13 LAB — BASIC METABOLIC PANEL
Anion gap: 6 (ref 5–15)
BUN: 16 mg/dL (ref 6–20)
CO2: 26 mmol/L (ref 22–32)
Calcium: 8.9 mg/dL (ref 8.9–10.3)
Chloride: 108 mmol/L (ref 101–111)
Creatinine, Ser: 1.19 mg/dL (ref 0.61–1.24)
GFR calc Af Amer: >60 mL/min (ref >60)
GFR calc non Af Amer: >60 mL/min (ref >60)
Glucose, Bld: 112 mg/dL — ABNORMAL HIGH (ref 65–99)
Potassium: 4 mmol/L (ref 3.5–5.1)
Sodium: 140 mmol/L (ref 135–145)

## 2015-06-13 LAB — URINALYSIS, ROUTINE W REFLEX MICROSCOPIC
Bilirubin Urine: NEGATIVE
Glucose, UA: NEGATIVE mg/dL
Hgb urine dipstick: NEGATIVE
Ketones, ur: NEGATIVE mg/dL
Leukocytes, UA: NEGATIVE
Nitrite: NEGATIVE
Protein, ur: NEGATIVE mg/dL
Specific Gravity, Urine: 1.011 (ref 1.005–1.030)
Urobilinogen, UA: 1 mg/dL (ref 0.0–1.0)
pH: 5.5 (ref 5.0–8.0)

## 2015-06-13 LAB — TYPE AND SCREEN
ABO/RH(D): A POS
Antibody Screen: NEGATIVE

## 2015-06-13 LAB — GLUCOSE, CAPILLARY
Glucose-Capillary: 140 mg/dL — ABNORMAL HIGH (ref 65–99)
Glucose-Capillary: 131 mg/dL — ABNORMAL HIGH (ref 65–99)
Glucose-Capillary: 125 mg/dL — ABNORMAL HIGH (ref 65–99)
Glucose-Capillary: 155 mg/dL — ABNORMAL HIGH (ref 65–99)

## 2015-06-13 LAB — HEPARIN LEVEL (UNFRACTIONATED)
HEPARIN UNFRACTIONATED: 0.87 [IU]/mL — AB (ref 0.30–0.70)
Heparin Unfractionated: 0.76 IU/mL — ABNORMAL HIGH (ref 0.30–0.70)
Heparin Unfractionated: 0.65 IU/mL (ref 0.30–0.70)

## 2015-06-13 LAB — ABO/RH: ABO/RH(D): A POS

## 2015-06-13 MED ORDER — LEVOFLOXACIN IN D5W 500 MG/100ML IV SOLN
500.0000 mg | INTRAVENOUS | Status: DC
  Administered 2015-06-14: 500 mg via INTRAVENOUS
  Filled 2015-06-13: qty 100

## 2015-06-13 MED ORDER — DEXMEDETOMIDINE HCL IN NACL 400 MCG/100ML IV SOLN
0.1000 ug/kg/h | INTRAVENOUS | Status: DC
  Administered 2015-06-14: .3 ug/kg/h via INTRAVENOUS
  Filled 2015-06-13: qty 100

## 2015-06-13 MED ORDER — VANCOMYCIN HCL 10 G IV SOLR
1250.0000 mg | INTRAVENOUS | Status: DC
  Administered 2015-06-14: 1250 mg via INTRAVENOUS
  Filled 2015-06-13: qty 1250

## 2015-06-13 MED ORDER — NITROGLYCERIN IN D5W 200-5 MCG/ML-% IV SOLN: 2.0000 ug/min | INTRAVENOUS | Status: DC

## 2015-06-13 MED ORDER — POTASSIUM CHLORIDE 2 MEQ/ML IV SOLN
80.0000 meq | INTRAVENOUS | Status: DC
  Filled 2015-06-13: qty 40

## 2015-06-13 MED ORDER — PLASMA-LYTE 148 IV SOLN
INTRAVENOUS | Status: DC
  Filled 2015-06-13: qty 2.5

## 2015-06-13 MED ORDER — METOPROLOL TARTRATE 12.5 MG HALF TABLET
12.5000 mg | ORAL_TABLET | Freq: Once | ORAL | Status: AC
  Administered 2015-06-14: 12.5 mg via ORAL
  Filled 2015-06-13: qty 1

## 2015-06-13 MED ORDER — PHENYLEPHRINE HCL 10 MG/ML IJ SOLN
30.0000 ug/min | INTRAVENOUS | Status: DC
  Filled 2015-06-13 (×2): qty 2

## 2015-06-13 MED ORDER — SODIUM CHLORIDE 0.9 % IV SOLN
INTRAVENOUS | Status: DC
  Administered 2015-06-14: 69.8 mL/h via INTRAVENOUS
  Filled 2015-06-13: qty 40

## 2015-06-13 MED ORDER — SODIUM CHLORIDE 0.9 % IV SOLN
INTRAVENOUS | Status: DC
  Administered 2015-06-14: 1.5 [IU]/h via INTRAVENOUS
  Filled 2015-06-13: qty 2.5

## 2015-06-13 MED ORDER — SODIUM CHLORIDE 0.9 % IV SOLN
INTRAVENOUS | Status: DC
  Filled 2015-06-13: qty 30

## 2015-06-13 MED ORDER — MAGNESIUM SULFATE 50 % IJ SOLN
40.0000 meq | INTRAMUSCULAR | Status: DC
  Filled 2015-06-13: qty 10

## 2015-06-13 MED ORDER — DOPAMINE-DEXTROSE 3.2-5 MG/ML-% IV SOLN
0.0000 ug/kg/min | INTRAVENOUS | Status: DC
  Filled 2015-06-13: qty 250

## 2015-06-13 MED ORDER — EPINEPHRINE HCL 1 MG/ML IJ SOLN
0.0000 ug/min | INTRAVENOUS | Status: DC
  Filled 2015-06-13: qty 4

## 2015-06-13 NOTE — Clinical Documentation Improvement (Signed)
Please clarify and document in the progress notes and discharge summary if the diagnosis of Atrial Flutter is applicable to this admission.  Clinical Information: The diagnosis of "Atrial flutter with variable A-V block new since 2009" is documented in the ED physician note.  12 lead EKG dated 06/10/15 is also read as atrial flutter.  Repeat EKGs this admission have been read as sinus rhythm.  Thank You, Erling Conte ,RN Clinical Documentation Specialist:  [phone] [place] Information Management

## 2015-06-13 NOTE — Progress Notes (Signed)
ANTICOAGULATION CONSULT NOTE - Follow Up Consult  Pharmacy Consult for heparin Indication: CAD awaiting CABG   Labs:  Recent Labs  06/11/15 0130 06/11/15 0431 06/12/15 0304  06/13/15 0324 06/13/15 1245 06/13/15 2240  HGB  --   --   --   --  13.2  --   --   HCT  --   --   --   --  39.2  --   --   PLT  --   --   --   --  171  --   --   LABPROT  --  13.5  --   --   --   --   --   INR  --  1.01  --   --   --   --   --   HEPARINUNFRC  --   --   --   < > 0.76* 0.87* 0.65  CREATININE  --   --  1.07  --  1.19  --   --   TROPONINI <0.03  --   --   --   --   --   --   < > = values in this interval not displayed.   Assessment/Plan:  69yo male therapeutic on heparin after rate adjustments, plan for first-case CABG in am. Will continue gtt at current rate and confirm stable with am labs.   Wynona Neat, PharmD, BCPS  06/13/2015,11:16 PM

## 2015-06-13 NOTE — Progress Notes (Addendum)
ANTICOAGULATION CONSULT NOTE - Follow up [facility] for Heparin Indication: chest pain/ACS  Allergies  Allergen Reactions  . Aspirin Itching    Patient received ASA 324mg  PO, chewed, in ER on 06/10/2015 without any specific reaction.  Re-coded as intolerance, not allergy.  . Penicillins Rash    Patient Measurements: Height: 6' (182.9 cm) Weight: 178 lb 3.2 oz (80.831 kg) IBW/kg (Calculated) : 77.6  Vital Signs: Temp: 98.4 F (36.9 C) (07/30 2046) Temp Source: Oral (07/30 2046) BP: 110/66 mmHg (07/30 2046) Pulse Rate: 66 (07/30 2046)  Labs:  Recent Labs  06/10/15 1237 06/10/15 1941 06/11/15 0130 06/11/15 0431 06/12/15 0304  06/12/15 1405 06/12/15 2033 06/13/15 0324  HGB 14.8  --   --   --   --   --   --   --  13.2  HCT 43.0  --   --   --   --   --   --   --  39.2  PLT 167  --   --   --   --   --   --   --  171  LABPROT  --   --   --  13.5  --   --   --   --   --   INR  --   --   --  1.01  --   --   --   --   --   HEPARINUNFRC  --   --   --   --   --   < > <0.10* 1.08* 0.76*  CREATININE 1.09  --   --   --  1.07  --   --   --   --   TROPONINI <0.03 <0.03 <0.03  --   --   --   --   --   --   < > = values in this interval not displayed.  Estimated Creatinine Clearance: 70.5 mL/min (by C-G formula based on Cr of 1.07).  Assessment: 69 year old male admitted with chest pain. S/p cath, heparin resumed for severe 3 vessel CAD. Plan for CABG on 8/1. Heparin level remains slightly supratherapeutic (0.76) on 1200 units/hr. Level drawn ~6 hours post rate change. CBC stable.  Goal of Therapy:  Heparin level 0.3-0.7 units/ml Monitor platelets by anticoagulation protocol: Yes   Plan:  Decrease heparin to 1100 units/hr  F/u 8 hour heparin level  Sherlon Handing, PharmD, BCPS Clinical pharmacist, pager [phone] 06/13/2015 4:42 AM     Addendum -Heparin level is supratherapeutic -Reduce rate to 950 units/hr -Check confirmatory level tonight   Harvel Quale  06/13/2015 2:42 PM

## 2015-06-13 NOTE — Progress Notes (Signed)
PATIENT DETAILS Name: Hunter Sparks Age: 68 y.o. Sex: male Date of Birth: Jan 10, 1946 Admit Date: 06/10/2015 Admitting Physician Truett Mainland, DO WOE:[id number] L, MD  Subjective: No chest pain-sleeping comfortably  Assessment/Plan: Principal Problem: Suspected unstable angina:  Currently chest pain-free, troponins negative. Continue aspirin, statin and metoprolol. Evaluated by cardiology, underwent cardiac catheterization-showed triple-vessel disease-scheduled for CABG on 8/1.  Active Problems: Diabetes mellitus type 2, controlled: CBGs stable with SSI. Metformin remains on hold. A1c at 7.7  Hypothyroidism: Continue with levothyroxine  Hypertension: Stable-continue with metoprolol and lisinopril  Disposition: Remain inpatient  Antimicrobial agents  See below  Anti-infectives    None      DVT Prophylaxis: Prophylactic Lovenox   Code Status: Full code   Family Communication Spouse at bedside  Procedures: None  CONSULTS:  cardiology  Time spent 20 minutes-Greater than 50% of this time was spent in counseling, explanation of diagnosis, planning of further management, and coordination of care.  MEDICATIONS: Scheduled Meds: . aspirin EC  325 mg Oral Daily  . atorvastatin  40 mg Oral q1800  . bisacodyl  5 mg Oral Once  . chlorhexidine  60 mL Topical Once   And  . [START ON 06/14/2015] chlorhexidine  60 mL Topical Once  . [START ON 06/14/2015] diazepam  5 mg Oral Once  . insulin aspart  0-15 Units Subcutaneous TID WC  . insulin aspart  0-5 Units Subcutaneous QHS  . levothyroxine  75 mcg Oral QAC breakfast  . lisinopril  2.5 mg Oral QHS  . [START ON 06/14/2015] metoprolol tartrate  12.5 mg Oral Once  . metoprolol tartrate  25 mg Oral BID  . sodium chloride  3 mL Intravenous Q12H   Continuous Infusions: . heparin 1,100 Units/hr (06/13/15 0450)  . nitroGLYCERIN 10 mcg/min (06/11/15 1738)   PRN Meds:.sodium chloride, acetaminophen,  diazepam, morphine injection, ondansetron (ZOFRAN) IV, oxyCODONE-acetaminophen, sodium chloride, temazepam, traMADol    PHYSICAL EXAM: Vital signs in last 24 hours: Filed Vitals:   06/12/15 1401 06/12/15 2046 06/13/15 0500 06/13/15 0940  BP: 100/59 110/66 107/61 101/61  Pulse: 57 66 63 66  Temp: 97.7 F (36.5 C) 98.4 F (36.9 C) 97.9 F (36.6 C)   TempSrc: Oral Oral Oral   Resp: 18 18 18    Height:      Weight:   81.965 kg (180 lb 11.2 oz)   SpO2: 97% 98% 97%     Weight change: 1.134 kg (2 lb 8 oz) Filed Weights   06/11/15 0553 06/12/15 0611 06/13/15 0500  Weight: 79.425 kg (175 lb 1.6 oz) 80.831 kg (178 lb 3.2 oz) 81.965 kg (180 lb 11.2 oz)   Body mass index is 24.5 kg/(m^2).   Gen Exam: Awake and alert with clear speech.   Neck: Supple, No JVD.   Chest: B/L Clear.   CVS: S1 S2 Regular, no murmurs.  Abdomen: soft, BS +, non tender, non distended.  Extremities: no edema, lower extremities warm to touch. Neurologic: Non Focal.   Skin: No Rash.   Wounds: N/A.   Intake/Output from previous day:  Intake/Output Summary (Last 24 hours) at 06/13/15 1026 Last data filed at 06/13/15 0900  Gross per 24 hour  Intake 921.58 ml  Output      0 ml  Net 921.58 ml     LAB RESULTS: CBC  Recent Labs Lab 06/10/15 1237 06/13/15 0324  WBC 6.1 6.9  HGB 14.8 13.2  HCT 43.0 39.2  PLT 167 171  MCV 90.3 90.5  MCH 31.1 30.5  MCHC 34.4 33.7  RDW 12.5 12.6  LYMPHSABS 1.7  --   MONOABS 0.6  --   EOSABS 0.2  --   BASOSABS 0.0  --     Chemistries   Recent Labs Lab 06/10/15 1237 06/12/15 0304 06/13/15 0324  NA 139 140 140  K 4.5 4.3 4.0  CL 104 108 108  CO2 29 28 26   GLUCOSE 186* 113* 112*  BUN 10 12 16   CREATININE 1.09 1.07 1.19  CALCIUM 9.4 8.9 8.9    CBG:  Recent Labs Lab 06/12/15 0742 06/12/15 1136 06/12/15 1623 06/12/15 2137 06/13/15 0729  GLUCAP 131* 136* 152* 212* 140*    GFR Estimated Creatinine Clearance: 63.4 mL/min (by C-G formula based on  Cr of 1.19).  Coagulation profile  Recent Labs Lab 06/11/15 0431  INR 1.01    Cardiac Enzymes  Recent Labs Lab 06/10/15 1237 06/10/15 1941 06/11/15 0130  TROPONINI <0.03 <0.03 <0.03    Invalid input(s): POCBNP No results for input(s): DDIMER in the last 72 hours.  Recent Labs  06/10/15 1542  HGBA1C 7.7*    Recent Labs  06/11/15 0130  CHOL 222*  HDL 37*  LDLCALC 161*  TRIG 118  CHOLHDL 6.0   No results for input(s): TSH, T4TOTAL, T3FREE, THYROIDAB in the last 72 hours.  Invalid input(s): FREET3 No results for input(s): VITAMINB12, FOLATE, FERRITIN, TIBC, IRON, RETICCTPCT in the last 72 hours. No results for input(s): LIPASE, AMYLASE in the last 72 hours.  Urine Studies No results for input(s): UHGB, CRYS in the last 72 hours.  Invalid input(s): UACOL, UAPR, USPG, UPH, UTP, UGL, UKET, UBIL, UNIT, UROB, ULEU, UEPI, UWBC, URBC, UBAC, CAST, UCOM, BILUA  MICROBIOLOGY: Recent Results (from the past 240 hour(s))  MRSA PCR Screening     Status: None   Collection Time: 06/10/15  6:04 PM  Result Value Ref Range Status   MRSA by PCR NEGATIVE NEGATIVE Final    Comment:        The GeneXpert MRSA Assay (FDA approved for NASAL specimens only), is one component of a comprehensive MRSA colonization surveillance program. It is not intended to diagnose MRSA infection nor to guide or monitor treatment for MRSA infections.     RADIOLOGY STUDIES/RESULTS: Dg Chest 2 View  06/10/2015   CLINICAL DATA:  Chest pain for 2 weeks  EXAM: CHEST - 2 VIEW  COMPARISON:  None.  FINDINGS: Cardiac shadow is within normal limits. The lungs are well aerated bilaterally. No focal infiltrate, effusion or pneumothorax is noted. Mild degenerative change of the thoracic spine is noted. No acute abnormality is noted.  IMPRESSION: No active disease.   Electronically Signed   By: Inez Catalina M.D.   On: 06/10/2015 13:10   Dg Facet Jt Inj C/t Single Level Left W/fl/ct  05/19/2015   CLINICAL  DATA:  Chronic lumbago/low back pain. Mid back pain radiating to the anterior and posterior leg on the LEFT. 75% relief with last facet injection for 3 months.  EXAM: LUMBAR FACET INJECTION, LEFT L4-L5 and LEFT L5-S1  FLUOROSCOPY TIME:  0 minutes 59 seconds  Dose: 0.353 Gycm2  COMPARISON:  01/29/2015.  PROCEDURE: Consent was obtained after a discussion of risks and benefits of the procedure. General risks included bleeding, infection, injury to nerves, blood vessels, and adjacent structures. Specific risks included nondiagnostic and nontherapeutic injection, non target injection, worsening of back pain.  The skin was  prepped and draped in the usual sterile fashion. Under fluoroscopic guidance a curved 22 gauge 3 1/2-inch spinal needle was advanced into the facet joint at L4-L5 and L5-S1 on the LEFT. Injection of 0.50 ml of Omnipaque 180 was performed which showed intra-articular needle placement. No vascular uptake was observed. Subsequently, a small amount of 0.25% sensorcaine, estimated at 0.5 ml with 60 mg of Depo-Medrol was injected into each facet joint. Concordant LEFT lower extremity radiculopathy was experienced with LEFT facet joint injection.  The patient tolerated the procedure well and there were no complications. The patient was observed in the holding room for 30 minutes prior to discharge.  IMPRESSION: Technically successful LEFT L4-L5 and LEFT L5-S1 facet injections with steroid and anesthetic. The patient could be considered for repeat facet injections in 2-3 months if 50% pain relief from today's injections for 6 weeks.   Electronically Signed   By: Dereck Ligas M.D.   On: 05/19/2015 11:36   Dg Facet Jt Inj L /s  2nd Level Left W/fl/ct  05/19/2015   CLINICAL DATA:  Chronic lumbago/low back pain. Mid back pain radiating to the anterior and posterior leg on the LEFT. 75% relief with last facet injection for 3 months.  EXAM: LUMBAR FACET INJECTION, LEFT L4-L5 and LEFT L5-S1  FLUOROSCOPY TIME:  0  minutes 59 seconds  Dose: 0.353 Gycm2  COMPARISON:  01/29/2015.  PROCEDURE: Consent was obtained after a discussion of risks and benefits of the procedure. General risks included bleeding, infection, injury to nerves, blood vessels, and adjacent structures. Specific risks included nondiagnostic and nontherapeutic injection, non target injection, worsening of back pain.  The skin was prepped and draped in the usual sterile fashion. Under fluoroscopic guidance a curved 22 gauge 3 1/2-inch spinal needle was advanced into the facet joint at L4-L5 and L5-S1 on the LEFT. Injection of 0.50 ml of Omnipaque 180 was performed which showed intra-articular needle placement. No vascular uptake was observed. Subsequently, a small amount of 0.25% sensorcaine, estimated at 0.5 ml with 60 mg of Depo-Medrol was injected into each facet joint. Concordant LEFT lower extremity radiculopathy was experienced with LEFT facet joint injection.  The patient tolerated the procedure well and there were no complications. The patient was observed in the holding room for 30 minutes prior to discharge.  IMPRESSION: Technically successful LEFT L4-L5 and LEFT L5-S1 facet injections with steroid and anesthetic. The patient could be considered for repeat facet injections in 2-3 months if 50% pain relief from today's injections for 6 weeks.   Electronically Signed   By: Dereck Ligas M.D.   On: 05/19/2015 11:36    Oren Binet, MD  [facility] Pager:[phone]  If 7PM-7AM, please contact night-coverage [email or web address] Password [email or web address] 06/13/2015, 10:26 AM   LOS: 3 days

## 2015-06-14 ENCOUNTER — Inpatient Hospital Stay (HOSPITAL_COMMUNITY): Payer: Medicare Other | Admitting: Anesthesiology

## 2015-06-14 ENCOUNTER — Inpatient Hospital Stay (HOSPITAL_COMMUNITY): Payer: Medicare Other

## 2015-06-14 ENCOUNTER — Encounter (HOSPITAL_COMMUNITY)
Admission: EM | Disposition: A | Discharge status: Home Health | Payer: Medicare Other | Source: Home / Self Care | Attending: Cardiothoracic Surgery

## 2015-06-14 ENCOUNTER — Encounter (HOSPITAL_COMMUNITY): Payer: Self-pay | Admitting: Interventional Cardiology

## 2015-06-14 DIAGNOSIS — I25110 Atherosclerotic heart disease of native coronary artery with unstable angina pectoris: Secondary | ICD-10-CM

## 2015-06-14 DIAGNOSIS — Z951 Presence of aortocoronary bypass graft: Secondary | ICD-10-CM

## 2015-06-14 HISTORY — PX: CORONARY ARTERY BYPASS GRAFT: SHX141

## 2015-06-14 HISTORY — PX: TEE WITHOUT CARDIOVERSION: SHX5443

## 2015-06-14 LAB — CREATININE, SERUM
Creatinine, Ser: 1.13 mg/dL (ref 0.61–1.24)
GFR calc Af Amer: >60 mL/min (ref >60)
GFR calc non Af Amer: >60 mL/min (ref >60)

## 2015-06-14 LAB — CBC
HCT: 39 % (ref 39.0–52.0)
HCT: 31 % — ABNORMAL LOW (ref 39.0–52.0)
HCT: 28.6 % — ABNORMAL LOW (ref 39.0–52.0)
HEMOGLOBIN: 13.4 g/dL (ref 13.0–17.0)
HEMOGLOBIN: 10.9 g/dL — AB (ref 13.0–17.0)
Hemoglobin: 9.9 g/dL — ABNORMAL LOW (ref 13.0–17.0)
MCH: 31.2 pg (ref 26.0–34.0)
MCH: 31.4 pg (ref 26.0–34.0)
MCH: 30.7 pg (ref 26.0–34.0)
MCHC: 34.4 g/dL (ref 30.0–36.0)
MCHC: 35.2 g/dL (ref 30.0–36.0)
MCHC: 34.6 g/dL (ref 30.0–36.0)
MCV: 90.9 fL (ref 78.0–100.0)
MCV: 89.3 fL (ref 78.0–100.0)
MCV: 88.5 fL (ref 78.0–100.0)
Platelets: 164 10*3/uL (ref 150–400)
Platelets: 91 10*3/uL — ABNORMAL LOW (ref 150–400)
Platelets: 119 10*3/uL — ABNORMAL LOW (ref 150–400)
RBC: 4.29 MIL/uL (ref 4.22–5.81)
RBC: 3.47 MIL/uL — AB (ref 4.22–5.81)
RBC: 3.23 MIL/uL — ABNORMAL LOW (ref 4.22–5.81)
RDW: 12.7 % (ref 11.5–15.5)
RDW: 12.5 % (ref 11.5–15.5)
RDW: 12.7 % (ref 11.5–15.5)
WBC: 6.8 10*3/uL (ref 4.0–10.5)
WBC: 9.7 10*3/uL (ref 4.0–10.5)
WBC: 10.1 10*3/uL (ref 4.0–10.5)

## 2015-06-14 LAB — POCT I-STAT, CHEM 8
BUN: 14 mg/dL (ref 6–20)
BUN: 10 mg/dL (ref 6–20)
BUN: 13 mg/dL (ref 6–20)
BUN: 10 mg/dL (ref 6–20)
BUN: 12 mg/dL (ref 6–20)
BUN: 12 mg/dL (ref 6–20)
CALCIUM ION: 0.93 mmol/L — AB (ref 1.13–1.30)
CALCIUM ION: 1 mmol/L — AB (ref 1.13–1.30)
CALCIUM ION: 1.05 mmol/L — AB (ref 1.13–1.30)
CHLORIDE: 107 mmol/L (ref 101–111)
CHLORIDE: 100 mmol/L — AB (ref 101–111)
CHLORIDE: 107 mmol/L (ref 101–111)
CREATININE: 0.9 mg/dL (ref 0.61–1.24)
CREATININE: 0.6 mg/dL — AB (ref 0.61–1.24)
CREATININE: 0.8 mg/dL (ref 0.61–1.24)
Calcium, Ion: 1.2 mmol/L (ref 1.13–1.30)
Calcium, Ion: 1.19 mmol/L (ref 1.13–1.30)
Calcium, Ion: 1.15 mmol/L (ref 1.13–1.30)
Chloride: 102 mmol/L (ref 101–111)
Chloride: 109 mmol/L (ref 101–111)
Chloride: 106 mmol/L (ref 101–111)
Creatinine, Ser: 0.8 mg/dL (ref 0.61–1.24)
Creatinine, Ser: 0.8 mg/dL (ref 0.61–1.24)
Creatinine, Ser: 1 mg/dL (ref 0.61–1.24)
GLUCOSE: 121 mg/dL — AB (ref 65–99)
GLUCOSE: 122 mg/dL — AB (ref 65–99)
Glucose, Bld: 133 mg/dL — ABNORMAL HIGH (ref 65–99)
Glucose, Bld: 145 mg/dL — ABNORMAL HIGH (ref 65–99)
Glucose, Bld: 135 mg/dL — ABNORMAL HIGH (ref 65–99)
Glucose, Bld: 161 mg/dL — ABNORMAL HIGH (ref 65–99)
HCT: 36 % — ABNORMAL LOW (ref 39.0–52.0)
HCT: 26 % — ABNORMAL LOW (ref 39.0–52.0)
HCT: 26 % — ABNORMAL LOW (ref 39.0–52.0)
HCT: 27 % — ABNORMAL LOW (ref 39.0–52.0)
HCT: 28 % — ABNORMAL LOW (ref 39.0–52.0)
HEMATOCRIT: 35 % — AB (ref 39.0–52.0)
HEMOGLOBIN: 12.2 g/dL — AB (ref 13.0–17.0)
HEMOGLOBIN: 11.9 g/dL — AB (ref 13.0–17.0)
HEMOGLOBIN: 8.8 g/dL — AB (ref 13.0–17.0)
HEMOGLOBIN: 8.8 g/dL — AB (ref 13.0–17.0)
HEMOGLOBIN: 9.2 g/dL — AB (ref 13.0–17.0)
HEMOGLOBIN: 9.5 g/dL — AB (ref 13.0–17.0)
POTASSIUM: 4.1 mmol/L (ref 3.5–5.1)
POTASSIUM: 4.1 mmol/L (ref 3.5–5.1)
Potassium: 4.1 mmol/L (ref 3.5–5.1)
Potassium: 4.2 mmol/L (ref 3.5–5.1)
Potassium: 4.8 mmol/L (ref 3.5–5.1)
Potassium: 4.6 mmol/L (ref 3.5–5.1)
SODIUM: 139 mmol/L (ref 135–145)
Sodium: 140 mmol/L (ref 135–145)
Sodium: 141 mmol/L (ref 135–145)
Sodium: 132 mmol/L — ABNORMAL LOW (ref 135–145)
Sodium: 137 mmol/L (ref 135–145)
Sodium: 142 mmol/L (ref 135–145)
TCO2: 30 mmol/L (ref 0–100)
TCO2: 26 mmol/L (ref 0–100)
TCO2: 27 mmol/L (ref 0–100)
TCO2: 28 mmol/L (ref 0–100)
TCO2: 28 mmol/L (ref 0–100)
TCO2: 22 mmol/L (ref 0–100)

## 2015-06-14 LAB — POCT I-STAT 3, ART BLOOD GAS (G3+)
Acid-Base Excess: 3 mmol/L — ABNORMAL HIGH (ref 0.0–2.0)
Acid-Base Excess: 4 mmol/L — ABNORMAL HIGH (ref 0.0–2.0)
Acid-Base Excess: 2 mmol/L (ref 0.0–2.0)
Acid-Base Excess: 1 mmol/L (ref 0.0–2.0)
Bicarbonate: 27.8 mEq/L — ABNORMAL HIGH (ref 20.0–24.0)
Bicarbonate: 28.2 mEq/L — ABNORMAL HIGH (ref 20.0–24.0)
Bicarbonate: 25.9 mEq/L — ABNORMAL HIGH (ref 20.0–24.0)
Bicarbonate: 24.1 mEq/L — ABNORMAL HIGH (ref 20.0–24.0)
O2 Saturation: 100 %
O2 Saturation: 100 %
O2 Saturation: 99 %
O2 Saturation: 99 %
PCO2 ART: 43.5 mmHg (ref 35.0–45.0)
PCO2 ART: 37.2 mmHg (ref 35.0–45.0)
PH ART: 7.414 (ref 7.350–7.450)
PH ART: 7.473 — AB (ref 7.350–7.450)
PH ART: 7.451 — AB (ref 7.350–7.450)
PO2 ART: 448 mmHg — AB (ref 80.0–100.0)
PO2 ART: 131 mmHg — AB (ref 80.0–100.0)
TCO2: 29 mmol/L (ref 0–100)
TCO2: 29 mmol/L (ref 0–100)
TCO2: 27 mmol/L (ref 0–100)
TCO2: 25 mmol/L (ref 0–100)
pCO2 arterial: 38.5 mmHg (ref 35.0–45.0)
pCO2 arterial: 31.2 mmHg — ABNORMAL LOW (ref 35.0–45.0)
pH, Arterial: 7.489 — ABNORMAL HIGH (ref 7.350–7.450)
pO2, Arterial: 371 mmHg — ABNORMAL HIGH (ref 80.0–100.0)
pO2, Arterial: 120 mmHg — ABNORMAL HIGH (ref 80.0–100.0)

## 2015-06-14 LAB — POCT I-STAT 4, (NA,K, GLUC, HGB,HCT)
Glucose, Bld: 103 mg/dL — ABNORMAL HIGH (ref 65–99)
HEMATOCRIT: 33 % — AB (ref 39.0–52.0)
Hemoglobin: 11.2 g/dL — ABNORMAL LOW (ref 13.0–17.0)
Potassium: 3.4 mmol/L — ABNORMAL LOW (ref 3.5–5.1)
Sodium: 141 mmol/L (ref 135–145)

## 2015-06-14 LAB — CARBOXYHEMOGLOBIN
Carboxyhemoglobin: 0.6 % (ref 0.5–1.5)
Methemoglobin: 1.2 % (ref 0.0–1.5)
O2 Saturation: 53.1 %
Total hemoglobin: 10.4 g/dL — ABNORMAL LOW (ref 13.5–18.0)

## 2015-06-14 LAB — GLUCOSE, CAPILLARY
GLUCOSE-CAPILLARY: 126 mg/dL — AB (ref 65–99)
GLUCOSE-CAPILLARY: 66 mg/dL (ref 65–99)

## 2015-06-14 LAB — HEPARIN LEVEL (UNFRACTIONATED): HEPARIN UNFRACTIONATED: 0.54 [IU]/mL (ref 0.30–0.70)

## 2015-06-14 LAB — PROTIME-INR
INR: 1.5 — AB (ref 0.00–1.49)
Prothrombin Time: 18.2 seconds — ABNORMAL HIGH (ref 11.6–15.2)

## 2015-06-14 LAB — APTT: APTT: 35 s (ref 24–37)

## 2015-06-14 LAB — HEMOGLOBIN AND HEMATOCRIT, BLOOD
HCT: 27.6 % — ABNORMAL LOW (ref 39.0–52.0)
Hemoglobin: 9.6 g/dL — ABNORMAL LOW (ref 13.0–17.0)

## 2015-06-14 LAB — PLATELET COUNT: Platelets: 99 10*3/uL — ABNORMAL LOW (ref 150–400)

## 2015-06-14 LAB — MAGNESIUM: Magnesium: 2.7 mg/dL — ABNORMAL HIGH (ref 1.7–2.4)

## 2015-06-14 SURGERY — CORONARY ARTERY BYPASS GRAFTING (CABG)
Anesthesia: General | Site: Chest

## 2015-06-14 MED ORDER — PHENYLEPHRINE HCL 10 MG/ML IJ SOLN
INTRAMUSCULAR | Status: DC | PRN
  Administered 2015-06-14: 40 ug via INTRAVENOUS
  Administered 2015-06-14: 80 ug via INTRAVENOUS
  Administered 2015-06-14 (×4): 40 ug via INTRAVENOUS
  Administered 2015-06-14: 80 ug via INTRAVENOUS
  Administered 2015-06-14: 40 ug via INTRAVENOUS

## 2015-06-14 MED ORDER — VANCOMYCIN HCL IN DEXTROSE 1-5 GM/200ML-% IV SOLN
1000.0000 mg | Freq: Two times a day (BID) | INTRAVENOUS | Status: AC
  Administered 2015-06-14 – 2015-06-15 (×2): 1000 mg via INTRAVENOUS
  Filled 2015-06-14 (×3): qty 200

## 2015-06-14 MED ORDER — LACTATED RINGERS IV SOLN
INTRAVENOUS | Status: DC | PRN
  Administered 2015-06-14: 07:00:00 via INTRAVENOUS

## 2015-06-14 MED ORDER — SODIUM CHLORIDE 0.9 % IV SOLN
INTRAVENOUS | Status: DC
  Administered 2015-06-14: 1.3 [IU]/h via INTRAVENOUS
  Administered 2015-06-14: 14:00:00 via INTRAVENOUS
  Filled 2015-06-14: qty 2.5

## 2015-06-14 MED ORDER — 0.9 % SODIUM CHLORIDE (POUR BTL) OPTIME
TOPICAL | Status: DC | PRN
  Administered 2015-06-14 (×4): 1000 mL

## 2015-06-14 MED ORDER — ALBUMIN HUMAN 5 % IV SOLN
INTRAVENOUS | Status: DC | PRN
  Administered 2015-06-14 (×2): via INTRAVENOUS

## 2015-06-14 MED ORDER — INSULIN REGULAR BOLUS VIA INFUSION
0.0000 [IU] | Freq: Three times a day (TID) | INTRAVENOUS | Status: DC
  Filled 2015-06-14: qty 10

## 2015-06-14 MED ORDER — SODIUM CHLORIDE 0.9 % IR SOLN
Status: DC | PRN
  Administered 2015-06-14: 500 mL via INTRAVASCULAR

## 2015-06-14 MED ORDER — BISACODYL 5 MG PO TBEC
10.0000 mg | DELAYED_RELEASE_TABLET | Freq: Every day | ORAL | Status: DC
  Administered 2015-06-15 – 2015-06-17 (×3): 10 mg via ORAL
  Filled 2015-06-14 (×3): qty 2

## 2015-06-14 MED ORDER — FENTANYL CITRATE (PF) 250 MCG/5ML IJ SOLN
INTRAMUSCULAR | Status: AC
  Filled 2015-06-14: qty 5

## 2015-06-14 MED ORDER — SODIUM CHLORIDE 0.9 % IV SOLN: INTRAVENOUS | Status: DC

## 2015-06-14 MED ORDER — PANTOPRAZOLE SODIUM 40 MG PO TBEC
40.0000 mg | DELAYED_RELEASE_TABLET | Freq: Every day | ORAL | Status: DC
  Administered 2015-06-15 – 2015-06-18 (×4): 40 mg via ORAL
  Filled 2015-06-14 (×3): qty 1

## 2015-06-14 MED ORDER — OXYCODONE HCL 5 MG PO TABS
5.0000 mg | ORAL_TABLET | ORAL | Status: DC | PRN
  Administered 2015-06-15 (×2): 10 mg via ORAL
  Administered 2015-06-15: 5 mg via ORAL
  Administered 2015-06-15 – 2015-06-16 (×3): 10 mg via ORAL
  Filled 2015-06-14 (×2): qty 2
  Filled 2015-06-14: qty 1
  Filled 2015-06-14 (×3): qty 2

## 2015-06-14 MED ORDER — METOPROLOL TARTRATE 25 MG/10 ML ORAL SUSPENSION
12.5000 mg | Freq: Two times a day (BID) | ORAL | Status: DC
  Administered 2015-06-16: 12.5 mg
  Filled 2015-06-14 (×9): qty 5

## 2015-06-14 MED ORDER — POTASSIUM CHLORIDE 10 MEQ/50ML IV SOLN
10.0000 meq | INTRAVENOUS | Status: AC
  Administered 2015-06-14 (×3): 10 meq via INTRAVENOUS

## 2015-06-14 MED ORDER — MORPHINE SULFATE 2 MG/ML IJ SOLN
1.0000 mg | INTRAMUSCULAR | Status: DC | PRN
  Administered 2015-06-14 (×2): 2 mg via INTRAVENOUS
  Administered 2015-06-14: 1 mg via INTRAVENOUS
  Administered 2015-06-14: 2 mg via INTRAVENOUS
  Filled 2015-06-14 (×2): qty 1
  Filled 2015-06-14: qty 2

## 2015-06-14 MED ORDER — DOCUSATE SODIUM 100 MG PO CAPS
200.0000 mg | ORAL_CAPSULE | Freq: Every day | ORAL | Status: DC
  Administered 2015-06-15 – 2015-06-18 (×4): 200 mg via ORAL
  Filled 2015-06-14 (×5): qty 2

## 2015-06-14 MED ORDER — NITROGLYCERIN IN D5W 200-5 MCG/ML-% IV SOLN: 0.0000 ug/min | INTRAVENOUS | Status: DC

## 2015-06-14 MED ORDER — DOPAMINE-DEXTROSE 3.2-5 MG/ML-% IV SOLN
0.0000 ug/kg/min | INTRAVENOUS | Status: DC
Start: 2015-06-14 — End: 2015-06-15
  Administered 2015-06-14: 3 ug/kg/min via INTRAVENOUS

## 2015-06-14 MED ORDER — ACETAMINOPHEN 500 MG PO TABS
1000.0000 mg | ORAL_TABLET | Freq: Four times a day (QID) | ORAL | Status: DC
  Administered 2015-06-15 – 2015-06-19 (×14): 1000 mg via ORAL
  Filled 2015-06-14 (×20): qty 2

## 2015-06-14 MED ORDER — LIDOCAINE HCL (CARDIAC) 20 MG/ML IV SOLN
INTRAVENOUS | Status: AC
  Filled 2015-06-14: qty 10

## 2015-06-14 MED ORDER — LACTATED RINGERS IV SOLN: INTRAVENOUS | Status: DC

## 2015-06-14 MED ORDER — FENTANYL CITRATE (PF) 100 MCG/2ML IJ SOLN
INTRAMUSCULAR | Status: DC | PRN
  Administered 2015-06-14: 150 ug via INTRAVENOUS
  Administered 2015-06-14: 50 ug via INTRAVENOUS
  Administered 2015-06-14: 250 ug via INTRAVENOUS
  Administered 2015-06-14: 100 ug via INTRAVENOUS
  Administered 2015-06-14: 50 ug via INTRAVENOUS
  Administered 2015-06-14: 200 ug via INTRAVENOUS
  Administered 2015-06-14 (×2): 50 ug via INTRAVENOUS
  Administered 2015-06-14 (×3): 150 ug via INTRAVENOUS
  Administered 2015-06-14: 400 ug via INTRAVENOUS

## 2015-06-14 MED ORDER — HEPARIN SODIUM (PORCINE) 1000 UNIT/ML IJ SOLN
INTRAMUSCULAR | Status: AC
  Filled 2015-06-14: qty 1

## 2015-06-14 MED ORDER — PROTAMINE SULFATE 10 MG/ML IV SOLN
INTRAVENOUS | Status: AC
  Filled 2015-06-14: qty 5

## 2015-06-14 MED ORDER — BISACODYL 10 MG RE SUPP: 10.0000 mg | Freq: Every day | RECTAL | Status: DC

## 2015-06-14 MED ORDER — METOPROLOL TARTRATE 12.5 MG HALF TABLET
12.5000 mg | ORAL_TABLET | Freq: Two times a day (BID) | ORAL | Status: DC
Start: 2015-06-14 — End: 2015-06-18
  Administered 2015-06-15: 12.5 mg via ORAL
  Filled 2015-06-14 (×9): qty 1

## 2015-06-14 MED ORDER — PROPOFOL 10 MG/ML IV BOLUS
INTRAVENOUS | Status: AC
Start: 2015-06-14 — End: 2015-06-14
  Filled 2015-06-14: qty 20

## 2015-06-14 MED ORDER — ARTIFICIAL TEARS OP OINT
TOPICAL_OINTMENT | OPHTHALMIC | Status: DC | PRN
  Administered 2015-06-14: 1 via OPHTHALMIC

## 2015-06-14 MED ORDER — MORPHINE SULFATE 2 MG/ML IJ SOLN
2.0000 mg | INTRAMUSCULAR | Status: AC | PRN
  Administered 2015-06-15 (×3): 2 mg via INTRAVENOUS
  Administered 2015-06-15: 1 mg via INTRAVENOUS
  Filled 2015-06-14 (×3): qty 1

## 2015-06-14 MED ORDER — ALBUMIN HUMAN 5 % IV SOLN
250.0000 mL | INTRAVENOUS | Status: DC | PRN
  Administered 2015-06-14: 250 mL via INTRAVENOUS

## 2015-06-14 MED ORDER — POTASSIUM CHLORIDE 10 MEQ/50ML IV SOLN
10.0000 meq | INTRAVENOUS | Status: AC
  Administered 2015-06-14 (×2): 10 meq via INTRAVENOUS

## 2015-06-14 MED ORDER — PHENYLEPHRINE HCL 10 MG/ML IJ SOLN
10.0000 mg | INTRAVENOUS | Status: DC | PRN
  Administered 2015-06-14: 10 ug/min via INTRAVENOUS

## 2015-06-14 MED ORDER — SODIUM CHLORIDE 0.9 % IJ SOLN
INTRAMUSCULAR | Status: AC
  Filled 2015-06-14: qty 10

## 2015-06-14 MED ORDER — ASPIRIN EC 325 MG PO TBEC
325.0000 mg | DELAYED_RELEASE_TABLET | Freq: Every day | ORAL | Status: DC
  Administered 2015-06-15 – 2015-06-18 (×4): 325 mg via ORAL
  Filled 2015-06-14 (×5): qty 1

## 2015-06-14 MED ORDER — TRAMADOL HCL 50 MG PO TABS
50.0000 mg | ORAL_TABLET | ORAL | Status: DC | PRN
  Administered 2015-06-16: 50 mg via ORAL
  Administered 2015-06-16: 100 mg via ORAL
  Administered 2015-06-17 – 2015-06-18 (×4): 50 mg via ORAL
  Filled 2015-06-14 (×3): qty 1
  Filled 2015-06-14: qty 2
  Filled 2015-06-14 (×3): qty 1
  Filled 2015-06-14: qty 2

## 2015-06-14 MED ORDER — LACTATED RINGERS IV SOLN
INTRAVENOUS | Status: DC | PRN
  Administered 2015-06-14 (×2): via INTRAVENOUS

## 2015-06-14 MED ORDER — LEVOFLOXACIN IN D5W 750 MG/150ML IV SOLN
750.0000 mg | INTRAVENOUS | Status: AC
  Administered 2015-06-15: 750 mg via INTRAVENOUS
  Filled 2015-06-14: qty 150

## 2015-06-14 MED ORDER — METOCLOPRAMIDE HCL 5 MG/ML IJ SOLN
10.0000 mg | Freq: Four times a day (QID) | INTRAMUSCULAR | Status: AC
  Administered 2015-06-14 – 2015-06-15 (×4): 10 mg via INTRAVENOUS
  Filled 2015-06-14 (×4): qty 2

## 2015-06-14 MED ORDER — METOPROLOL TARTRATE 1 MG/ML IV SOLN: 2.5000 mg | INTRAVENOUS | Status: DC | PRN

## 2015-06-14 MED ORDER — DEXMEDETOMIDINE HCL IN NACL 200 MCG/50ML IV SOLN
0.0000 ug/kg/h | INTRAVENOUS | Status: DC
  Administered 2015-06-14: 0.7 ug/kg/h via INTRAVENOUS
  Filled 2015-06-14 (×2): qty 50

## 2015-06-14 MED ORDER — PANTOPRAZOLE SODIUM 40 MG PO TBEC: 40.0000 mg | DELAYED_RELEASE_TABLET | Freq: Every day | ORAL | Status: DC

## 2015-06-14 MED ORDER — DEXTROSE 50 % IV SOLN
INTRAVENOUS | Status: AC
  Filled 2015-06-14: qty 50

## 2015-06-14 MED ORDER — ROCURONIUM BROMIDE 50 MG/5ML IV SOLN
INTRAVENOUS | Status: AC
  Filled 2015-06-14: qty 1

## 2015-06-14 MED ORDER — SODIUM CHLORIDE 0.9 % IJ SOLN
OROMUCOSAL | Status: DC | PRN
  Administered 2015-06-14 (×3): 4 mL via TOPICAL

## 2015-06-14 MED ORDER — FAMOTIDINE IN NACL 20-0.9 MG/50ML-% IV SOLN
20.0000 mg | Freq: Two times a day (BID) | INTRAVENOUS | Status: AC
  Administered 2015-06-14 (×2): 20 mg via INTRAVENOUS
  Filled 2015-06-14: qty 50

## 2015-06-14 MED ORDER — LACTATED RINGERS IV SOLN: 500.0000 mL | Freq: Once | INTRAVENOUS | Status: DC | PRN

## 2015-06-14 MED ORDER — SODIUM CHLORIDE 0.9 % IJ SOLN
3.0000 mL | Freq: Two times a day (BID) | INTRAMUSCULAR | Status: DC
Start: 2015-06-15 — End: 2015-06-17
  Administered 2015-06-15 – 2015-06-17 (×5): 3 mL via INTRAVENOUS

## 2015-06-14 MED ORDER — ROCURONIUM BROMIDE 50 MG/5ML IV SOLN
INTRAVENOUS | Status: AC
  Filled 2015-06-14: qty 5

## 2015-06-14 MED ORDER — ALBUMIN HUMAN 5 % IV SOLN
250.0000 mL | INTRAVENOUS | Status: AC | PRN
  Administered 2015-06-14 (×3): 250 mL via INTRAVENOUS
  Filled 2015-06-14 (×2): qty 250

## 2015-06-14 MED ORDER — VANCOMYCIN HCL IN DEXTROSE 1-5 GM/200ML-% IV SOLN
1000.0000 mg | Freq: Once | INTRAVENOUS | Status: DC
  Filled 2015-06-14: qty 200

## 2015-06-14 MED ORDER — SODIUM CHLORIDE 0.9 % IJ SOLN: 3.0000 mL | INTRAMUSCULAR | Status: DC | PRN

## 2015-06-14 MED ORDER — MIDAZOLAM HCL 2 MG/2ML IJ SOLN
INTRAMUSCULAR | Status: AC
  Filled 2015-06-14: qty 4

## 2015-06-14 MED ORDER — PHENYLEPHRINE 40 MCG/ML (10ML) SYRINGE FOR IV PUSH (FOR BLOOD PRESSURE SUPPORT)
PREFILLED_SYRINGE | INTRAVENOUS | Status: AC
  Filled 2015-06-14: qty 10

## 2015-06-14 MED ORDER — ACETAMINOPHEN 650 MG RE SUPP
650.0000 mg | Freq: Once | RECTAL | Status: AC
  Administered 2015-06-14: 650 mg via RECTAL

## 2015-06-14 MED ORDER — ASPIRIN 81 MG PO CHEW
324.0000 mg | CHEWABLE_TABLET | Freq: Every day | ORAL | Status: DC
  Filled 2015-06-14: qty 4

## 2015-06-14 MED ORDER — PROTAMINE SULFATE 10 MG/ML IV SOLN
INTRAVENOUS | Status: AC
  Filled 2015-06-14: qty 25

## 2015-06-14 MED ORDER — PROPOFOL 10 MG/ML IV BOLUS
INTRAVENOUS | Status: DC | PRN
  Administered 2015-06-14: 100 mg via INTRAVENOUS

## 2015-06-14 MED ORDER — CETYLPYRIDINIUM CHLORIDE 0.05 % MT LIQD
7.0000 mL | Freq: Four times a day (QID) | OROMUCOSAL | Status: DC
  Administered 2015-06-14 – 2015-06-16 (×6): 7 mL via OROMUCOSAL

## 2015-06-14 MED ORDER — CHLORHEXIDINE GLUCONATE 0.12 % MT SOLN
15.0000 mL | Freq: Two times a day (BID) | OROMUCOSAL | Status: DC
  Administered 2015-06-14 – 2015-06-15 (×2): 15 mL via OROMUCOSAL
  Filled 2015-06-14 (×2): qty 15

## 2015-06-14 MED ORDER — HEPARIN SODIUM (PORCINE) 1000 UNIT/ML IJ SOLN
INTRAMUSCULAR | Status: DC | PRN
  Administered 2015-06-14: 29000 [IU] via INTRAVENOUS
  Administered 2015-06-14: 5000 [IU] via INTRAVENOUS

## 2015-06-14 MED ORDER — SODIUM CHLORIDE 0.45 % IV SOLN: INTRAVENOUS | Status: DC | PRN

## 2015-06-14 MED ORDER — MAGNESIUM SULFATE 4 GM/100ML IV SOLN
4.0000 g | Freq: Once | INTRAVENOUS | Status: AC
  Administered 2015-06-14: 4 g via INTRAVENOUS
  Filled 2015-06-14: qty 100

## 2015-06-14 MED ORDER — ROCURONIUM BROMIDE 100 MG/10ML IV SOLN
INTRAVENOUS | Status: DC | PRN
  Administered 2015-06-14 (×4): 50 mg via INTRAVENOUS

## 2015-06-14 MED ORDER — DEXTROSE 50 % IV SOLN
14.0000 mL | Freq: Once | INTRAVENOUS | Status: AC
Start: 2015-06-14 — End: 2015-06-14
  Administered 2015-06-14: 14 mL via INTRAVENOUS

## 2015-06-14 MED ORDER — MIDAZOLAM HCL 2 MG/2ML IJ SOLN
2.0000 mg | INTRAMUSCULAR | Status: DC | PRN
  Administered 2015-06-14: 2 mg via INTRAVENOUS
  Filled 2015-06-14: qty 2

## 2015-06-14 MED ORDER — FENTANYL CITRATE (PF) 250 MCG/5ML IJ SOLN
INTRAMUSCULAR | Status: AC
Start: 2015-06-14 — End: 2015-06-14
  Filled 2015-06-14: qty 5

## 2015-06-14 MED ORDER — ACETAMINOPHEN 160 MG/5ML PO SOLN
1000.0000 mg | Freq: Four times a day (QID) | ORAL | Status: DC
  Administered 2015-06-14: 1000 mg
  Filled 2015-06-14: qty 40.6

## 2015-06-14 MED ORDER — PHENYLEPHRINE HCL 10 MG/ML IJ SOLN
0.0000 ug/min | INTRAVENOUS | Status: DC
  Administered 2015-06-14 (×2): 5 ug/min via INTRAVENOUS
  Administered 2015-06-15: 35 ug/min via INTRAVENOUS
  Administered 2015-06-17: 20 ug/min via INTRAVENOUS
  Filled 2015-06-14 (×3): qty 2

## 2015-06-14 MED ORDER — LIDOCAINE HCL (CARDIAC) 20 MG/ML IV SOLN
INTRAVENOUS | Status: DC | PRN
  Administered 2015-06-14: 60 mg via INTRAVENOUS

## 2015-06-14 MED ORDER — DESMOPRESSIN ACETATE 4 MCG/ML IJ SOLN
10.0000 ug | INTRAMUSCULAR | Status: DC
  Filled 2015-06-14: qty 2.5

## 2015-06-14 MED ORDER — HEMOSTATIC AGENTS (NO CHARGE) OPTIME
TOPICAL | Status: DC | PRN
  Administered 2015-06-14 (×2): 1 via TOPICAL

## 2015-06-14 MED ORDER — EPHEDRINE SULFATE 50 MG/ML IJ SOLN
INTRAMUSCULAR | Status: AC
  Filled 2015-06-14: qty 1

## 2015-06-14 MED ORDER — SODIUM CHLORIDE 0.9 % IV SOLN
INTRAVENOUS | Status: DC | PRN
  Administered 2015-06-14: 12:00:00 via INTRAVENOUS

## 2015-06-14 MED ORDER — ARTIFICIAL TEARS OP OINT
TOPICAL_OINTMENT | OPHTHALMIC | Status: AC
  Filled 2015-06-14: qty 3.5

## 2015-06-14 MED ORDER — ONDANSETRON HCL 4 MG/2ML IJ SOLN: 4.0000 mg | Freq: Four times a day (QID) | INTRAMUSCULAR | Status: DC | PRN

## 2015-06-14 MED ORDER — MIDAZOLAM HCL 5 MG/5ML IJ SOLN
INTRAMUSCULAR | Status: DC | PRN
  Administered 2015-06-14: 1 mg via INTRAVENOUS
  Administered 2015-06-14: 2 mg via INTRAVENOUS
  Administered 2015-06-14: 1 mg via INTRAVENOUS
  Administered 2015-06-14: 2 mg via INTRAVENOUS
  Administered 2015-06-14: 3 mg via INTRAVENOUS
  Administered 2015-06-14: 1 mg via INTRAVENOUS
  Administered 2015-06-14: 2 mg via INTRAVENOUS

## 2015-06-14 MED ORDER — SODIUM CHLORIDE 0.9 % IV SOLN: 250.0000 mL | INTRAVENOUS | Status: DC

## 2015-06-14 MED ORDER — ACETAMINOPHEN 160 MG/5ML PO SOLN
650.0000 mg | Freq: Once | ORAL | Status: AC
Start: 2015-06-14 — End: 2015-06-14

## 2015-06-14 MED ORDER — PROTAMINE SULFATE 10 MG/ML IV SOLN
INTRAVENOUS | Status: DC | PRN
  Administered 2015-06-14: 80 mg via INTRAVENOUS
  Administered 2015-06-14: 50 mg via INTRAVENOUS
  Administered 2015-06-14: 25 mg via INTRAVENOUS
  Administered 2015-06-14: 20 mg via INTRAVENOUS
  Administered 2015-06-14: 10 mg via INTRAVENOUS
  Administered 2015-06-14: 50 mg via INTRAVENOUS

## 2015-06-14 MED ORDER — SODIUM CHLORIDE 0.9 % IV SOLN
10.0000 ug | INTRAVENOUS | Status: DC | PRN
  Administered 2015-06-14: 10 ug via INTRAVENOUS

## 2015-06-14 MED ORDER — MIDAZOLAM HCL 10 MG/2ML IJ SOLN
INTRAMUSCULAR | Status: AC
  Filled 2015-06-14: qty 2

## 2015-06-14 MED ORDER — LACTATED RINGERS IV SOLN
INTRAVENOUS | Status: DC
Start: 2015-06-14 — End: 2015-06-17
  Administered 2015-06-14 – 2015-06-15 (×2): via INTRAVENOUS

## 2015-06-14 MED FILL — Lidocaine HCl IV Inj 20 MG/ML: INTRAVENOUS | Qty: 5 | Status: AC

## 2015-06-14 MED FILL — Electrolyte-R (PH 7.4) Solution: INTRAVENOUS | Qty: 5000 | Status: AC

## 2015-06-14 MED FILL — Sodium Chloride IV Soln 0.9%: INTRAVENOUS | Qty: 2000 | Status: AC

## 2015-06-14 MED FILL — Sodium Bicarbonate IV Soln 8.4%: INTRAVENOUS | Qty: 50 | Status: AC

## 2015-06-14 MED FILL — Heparin Sodium (Porcine) Inj 1000 Unit/ML: INTRAMUSCULAR | Qty: 10 | Status: AC

## 2015-06-14 MED FILL — Mannitol IV Soln 20%: INTRAVENOUS | Qty: 500 | Status: AC

## 2015-06-14 SURGICAL SUPPLY — 100 items
ADAPTER CARDIO PERF ANTE/RETRO (ADAPTER) ×4 IMPLANT
ADPR PRFSN 84XANTGRD RTRGD (ADAPTER) ×2
BAG DECANTER FOR FLEXI CONT (MISCELLANEOUS) ×4 IMPLANT
BANDAGE ELASTIC 4 VELCRO ST LF (GAUZE/BANDAGES/DRESSINGS) ×4 IMPLANT
BANDAGE ELASTIC 6 VELCRO ST LF (GAUZE/BANDAGES/DRESSINGS) ×4 IMPLANT
BASKET HEART  (ORDER IN 25'S) (MISCELLANEOUS) ×1
BASKET HEART (ORDER IN 25'S) (MISCELLANEOUS) ×1
BASKET HEART (ORDER IN 25S) (MISCELLANEOUS) ×2 IMPLANT
BLADE 11 SAFETY STRL DISP (BLADE) ×2 IMPLANT
BLADE STERNUM SYSTEM 6 (BLADE) ×4 IMPLANT
BLADE SURG 12 STRL SS (BLADE) ×4 IMPLANT
BLADE SURG ROTATE 9660 (MISCELLANEOUS) IMPLANT
BNDG GAUZE ELAST 4 BULKY (GAUZE/BANDAGES/DRESSINGS) ×4 IMPLANT
CANISTER SUCTION 2500CC (MISCELLANEOUS) ×4 IMPLANT
CANNULA GUNDRY RCSP 15FR (MISCELLANEOUS) ×4 IMPLANT
CATH CPB KIT VANTRIGT (MISCELLANEOUS) ×4 IMPLANT
CATH ROBINSON RED A/P 18FR (CATHETERS) ×12 IMPLANT
CATH THORACIC 36FR RT ANG (CATHETERS) ×4 IMPLANT
CLIP FOGARTY SPRING 6M (CLIP) ×2 IMPLANT
CLIP TI WIDE RED SMALL 24 (CLIP) ×2 IMPLANT
COVER SURGICAL LIGHT HANDLE (MISCELLANEOUS) ×4 IMPLANT
CRADLE DONUT ADULT HEAD (MISCELLANEOUS) ×4 IMPLANT
DRAIN CHANNEL 32F RND 10.7 FF (WOUND CARE) ×4 IMPLANT
DRAPE CARDIOVASCULAR INCISE (DRAPES) ×4
DRAPE SLUSH/WARMER DISC (DRAPES) ×4 IMPLANT
DRAPE SRG 135X102X78XABS (DRAPES) ×2 IMPLANT
DRSG AQUACEL AG ADV 3.5X14 (GAUZE/BANDAGES/DRESSINGS) ×4 IMPLANT
DRSG COVADERM 4X14 (GAUZE/BANDAGES/DRESSINGS) ×2 IMPLANT
ELECT BLADE 4.0 EZ CLEAN MEGAD (MISCELLANEOUS) ×4
ELECT BLADE 6.5 EXT (BLADE) ×4 IMPLANT
ELECT CAUTERY BLADE 6.4 (BLADE) ×4 IMPLANT
ELECT REM PT RETURN 9FT ADLT (ELECTROSURGICAL) ×8
ELECTRODE BLDE 4.0 EZ CLN MEGD (MISCELLANEOUS) ×2 IMPLANT
ELECTRODE REM PT RTRN 9FT ADLT (ELECTROSURGICAL) ×4 IMPLANT
GAUZE SPONGE 4X4 12PLY STRL (GAUZE/BANDAGES/DRESSINGS) ×8 IMPLANT
GLOVE BIO SURGEON STRL SZ 6.5 (GLOVE) ×4 IMPLANT
GLOVE BIO SURGEON STRL SZ7 (GLOVE) ×8 IMPLANT
GLOVE BIO SURGEON STRL SZ7.5 (GLOVE) ×12 IMPLANT
GLOVE BIO SURGEONS STRL SZ 6.5 (GLOVE) ×4
GLOVE BIOGEL PI IND STRL 6.5 (GLOVE) IMPLANT
GLOVE BIOGEL PI IND STRL 7.0 (GLOVE) IMPLANT
GLOVE BIOGEL PI INDICATOR 6.5 (GLOVE) ×8
GLOVE BIOGEL PI INDICATOR 7.0 (GLOVE) ×8
GOWN STRL REUS W/ TWL LRG LVL3 (GOWN DISPOSABLE) ×8 IMPLANT
GOWN STRL REUS W/TWL LRG LVL3 (GOWN DISPOSABLE) ×28
HEMOSTAT POWDER SURGIFOAM 1G (HEMOSTASIS) ×12 IMPLANT
HEMOSTAT SURGICEL 2X14 (HEMOSTASIS) ×4 IMPLANT
INSERT FOGARTY XLG (MISCELLANEOUS) IMPLANT
KIT BASIN OR (CUSTOM PROCEDURE TRAY) ×4 IMPLANT
KIT ROOM TURNOVER OR (KITS) ×4 IMPLANT
KIT SUCTION CATH 14FR (SUCTIONS) ×4 IMPLANT
KIT VASOVIEW W/TROCAR VH 2000 (KITS) ×4 IMPLANT
LEAD PACING MYOCARDI (MISCELLANEOUS) ×4 IMPLANT
MARKER GRAFT CORONARY BYPASS (MISCELLANEOUS) ×12 IMPLANT
MATRIX HEMOSTAT SURGIFLO (HEMOSTASIS) ×2 IMPLANT
NS IRRIG 1000ML POUR BTL (IV SOLUTION) ×20 IMPLANT
PACK OPEN HEART (CUSTOM PROCEDURE TRAY) ×4 IMPLANT
PAD ARMBOARD 7.5X6 YLW CONV (MISCELLANEOUS) ×8 IMPLANT
PAD ELECT DEFIB RADIOL ZOLL (MISCELLANEOUS) ×4 IMPLANT
PENCIL BUTTON HOLSTER BLD 10FT (ELECTRODE) ×4 IMPLANT
PUNCH AORTIC ROTATE  4.5MM 8IN (MISCELLANEOUS) ×2 IMPLANT
PUNCH AORTIC ROTATE 4.0MM (MISCELLANEOUS) IMPLANT
PUNCH AORTIC ROTATE 4.5MM 8IN (MISCELLANEOUS) IMPLANT
PUNCH AORTIC ROTATE 5MM 8IN (MISCELLANEOUS) IMPLANT
SET CARDIOPLEGIA MPS 5001102 (MISCELLANEOUS) ×2 IMPLANT
SPONGE GAUZE 4X4 12PLY STER LF (GAUZE/BANDAGES/DRESSINGS) ×4 IMPLANT
SURGIFLO W/THROMBIN 8M KIT (HEMOSTASIS) ×4 IMPLANT
SUT BONE WAX W31G (SUTURE) ×4 IMPLANT
SUT MNCRL AB 4-0 PS2 18 (SUTURE) ×2 IMPLANT
SUT PROLENE 3 0 SH DA (SUTURE) IMPLANT
SUT PROLENE 3 0 SH1 36 (SUTURE) IMPLANT
SUT PROLENE 4 0 RB 1 (SUTURE) ×4
SUT PROLENE 4 0 SH DA (SUTURE) ×4 IMPLANT
SUT PROLENE 4-0 RB1 .5 CRCL 36 (SUTURE) ×2 IMPLANT
SUT PROLENE 5 0 C 1 36 (SUTURE) IMPLANT
SUT PROLENE 6 0 C 1 30 (SUTURE) ×8 IMPLANT
SUT PROLENE 6 0 CC (SUTURE) ×12 IMPLANT
SUT PROLENE 8 0 BV175 6 (SUTURE) ×2 IMPLANT
SUT PROLENE BLUE 7 0 (SUTURE) ×6 IMPLANT
SUT SILK  1 MH (SUTURE)
SUT SILK 1 MH (SUTURE) IMPLANT
SUT SILK 2 0 SH CR/8 (SUTURE) IMPLANT
SUT SILK 3 0 SH CR/8 (SUTURE) ×2 IMPLANT
SUT STEEL 6MS V (SUTURE) ×8 IMPLANT
SUT STEEL SZ 6 DBL 3X14 BALL (SUTURE) ×4 IMPLANT
SUT VIC AB 1 CTX 36 (SUTURE) ×16
SUT VIC AB 1 CTX36XBRD ANBCTR (SUTURE) ×4 IMPLANT
SUT VIC AB 2-0 CT1 27 (SUTURE) ×4
SUT VIC AB 2-0 CT1 TAPERPNT 27 (SUTURE) IMPLANT
SUT VIC AB 2-0 CTX 27 (SUTURE) IMPLANT
SUT VIC AB 3-0 X1 27 (SUTURE) IMPLANT
SUTURE E-PAK OPEN HEART (SUTURE) ×4 IMPLANT
SYSTEM SAHARA CHEST DRAIN ATS (WOUND CARE) ×4 IMPLANT
TAPE CLOTH SURG 4X10 WHT LF (GAUZE/BANDAGES/DRESSINGS) ×2 IMPLANT
TOWEL OR 17X24 6PK STRL BLUE (TOWEL DISPOSABLE) ×8 IMPLANT
TOWEL OR 17X26 10 PK STRL BLUE (TOWEL DISPOSABLE) ×8 IMPLANT
TRAY FOLEY IC TEMP SENS 16FR (CATHETERS) ×4 IMPLANT
TUBING INSUFFLATION (TUBING) ×4 IMPLANT
UNDERPAD 30X30 INCONTINENT (UNDERPADS AND DIAPERS) ×4 IMPLANT
WATER STERILE IRR 1000ML POUR (IV SOLUTION) ×8 IMPLANT

## 2015-06-14 NOTE — Progress Notes (Signed)
The patient was examined and preop studies reviewed. There has been no change from the prior exam and the patient is ready for surgery.   Plan CABG on B [place]

## 2015-06-14 NOTE — Transfer of Care (Signed)
Immediate Anesthesia Transfer of Care Note  Patient: Hunter Sparks  Procedure(s) Performed: Procedure(s): CORONARY ARTERY BYPASS GRAFTING times four using Left Internal mammary artery and right leg Saphenous vein graft (N/A) TRANSESOPHAGEAL ECHOCARDIOGRAM (TEE) (N/A)  Patient Location: SICU  Anesthesia Type:General  Level of Consciousness: sedated and Patient remains intubated per anesthesia plan  Airway & Oxygen Therapy: Patient remains intubated per anesthesia plan and Patient placed on Ventilator (see vital sign flow sheet for setting)  Post-op Assessment: Report given to RN and Post -op Vital signs reviewed and stable  Post vital signs: Reviewed and stable  Last Vitals:  Filed Vitals:   06/14/15 0500  BP: 121/66  Pulse: 61  Temp: 36.4 C  Resp:     Complications: No apparent anesthesia complications   Pt tx from OR to ICU with standard monitors (HR, BP, SPO2). Emergency drugs and equipment available. Report given to ICU RN and all questions answered. Airway intact. BP 110/63 upon arrival.   Garner Nash CRNA

## 2015-06-14 NOTE — OR Nursing (Signed)
Second call to SICU at 1250

## 2015-06-14 NOTE — Progress Notes (Signed)
RT note- placed back to full support, unable to raise head up, will rest for 30 min.

## 2015-06-14 NOTE — Progress Notes (Signed)
RT note- Dr. Prescott Gum called for weaning, instructed not to try again until 8-830pm

## 2015-06-14 NOTE — Brief Op Note (Signed)
06/10/2015 - 06/14/2015  11:25 AM  PATIENT:  Hunter Sparks  69 y.o. male  PRE-OPERATIVE DIAGNOSIS:  CAD  POST-OPERATIVE DIAGNOSIS:  coronary artery disease  PROCEDURE:  Procedure(s):  CORONARY ARTERY BYPASS GRAFTING x 4 -LIMA to LAD -SVG to DIAGONAL -SVG to OM -SVG to PDA  ENDOSCOPIC HARVEST GREATER SAPHENOUS VEIN  -Right Leg  TRANSESOPHAGEAL ECHOCARDIOGRAM (TEE) (N/A)  SURGEON:  Surgeon(s) and Role:    * Ivin Poot, MD - Primary  PHYSICIAN ASSISTANT: Ellwood Handler PA-C  ANESTHESIA:   general  EBL:  Total I/O In: -  Out: 1600 [Urine:1600]  BLOOD ADMINISTERED: CELLSAVER  DRAINS: Mediastinal Chest tubes, Left Pleural Chest Tubes   LOCAL MEDICATIONS USED:  NONE  SPECIMEN:  No Specimen  DISPOSITION OF SPECIMEN:  N/A  COUNTS:  YES  TOURNIQUET:  * No tourniquets in log *  DICTATION: .Dragon Dictation  PLAN OF CARE: Admit to inpatient   PATIENT DISPOSITION:  ICU - intubated and hemodynamically stable.   Delay start of Pharmacological VTE agent (>24hrs) due to surgical blood loss or risk of bleeding: yes

## 2015-06-14 NOTE — OR Nursing (Signed)
First call to SICU at 1225

## 2015-06-14 NOTE — Anesthesia Procedure Notes (Signed)
Procedure Name: Intubation Date/Time: 06/14/2015 8:12 AM Performed by: Merdis Delay Pre-anesthesia Checklist: Patient identified, Emergency Drugs available, Suction available, Patient being monitored and Timeout performed Patient Re-evaluated:Patient Re-evaluated prior to inductionOxygen Delivery Method: Circle system utilized Preoxygenation: Pre-oxygenation with 100% oxygen Intubation Type: IV induction Ventilation: Mask ventilation without difficulty and Oral airway inserted - appropriate to patient size Laryngoscope Size: Mac and 3 Grade View: Grade I Tube type: Oral Tube size: 8.0 mm Number of attempts: 1 Airway Equipment and Method: Stylet Placement Confirmation: ETT inserted through vocal cords under direct vision,  breath sounds checked- equal and bilateral,  positive ETCO2 and CO2 detector Secured at: 24 cm Tube secured with: Tape Dental Injury: Teeth and Oropharynx as per pre-operative assessment

## 2015-06-14 NOTE — Progress Notes (Signed)
Changed settings per Rapid Wean Protocol

## 2015-06-14 NOTE — Progress Notes (Signed)
  Echocardiogram  Transesophageal echocardiogram has been performed.  Hunter Sparks 06/14/2015, 9:08 AM

## 2015-06-14 NOTE — Progress Notes (Signed)
Wean initiated.

## 2015-06-14 NOTE — Anesthesia Preprocedure Evaluation (Addendum)
Anesthesia Evaluation  Patient identified by MRN, date of birth, ID band Patient awake    Reviewed: Allergy & Precautions, NPO status , Patient's Chart, lab work & pertinent test results  Airway Mallampati: II  TM Distance: >3 FB     Dental  (+) Edentulous Upper, Edentulous Lower, Dental Advisory Given   Pulmonary former smoker,  breath sounds clear to auscultation        Cardiovascular hypertension, + angina + CAD Rhythm:regular Rate:Normal     Neuro/Psych    GI/Hepatic   Endo/Other  diabetes, Type 2Hypothyroidism   Renal/GU      Musculoskeletal  (+) Arthritis -,   Abdominal   Peds  Hematology   Anesthesia Other Findings   Reproductive/Obstetrics                            Anesthesia Physical Anesthesia Plan  ASA: III  Anesthesia Plan: General   Post-op Pain Management:    Induction: Intravenous  Airway Management Planned: Oral ETT  Additional Equipment: Arterial line, CVP, PA Cath, Ultrasound Guidance Line Placement and TEE  Intra-op Plan:   Post-operative Plan: Post-operative intubation/ventilation  Informed Consent: I have reviewed the patients History and Physical, chart, labs and discussed the procedure including the risks, benefits and alternatives for the proposed anesthesia with the patient or authorized representative who has indicated his/her understanding and acceptance.     Plan Discussed with: CRNA, Anesthesiologist and Surgeon  Anesthesia Plan Comments:         Anesthesia Quick Evaluation

## 2015-06-14 NOTE — Progress Notes (Signed)
Patient ID: Hunter Sparks, male   DOB: 16-Dec-1945, 69 y.o.   MRN: [id number]  SICU Evening Rounds:   Hemodynamically stable  CI = 2.8 on dop 3, neo 20  Has started to wake up on vent.   Urine output good  CT output low  CBC    Component Value Date/Time   WBC 9.7 06/14/2015 1338   RBC 3.47* 06/14/2015 1338   HGB 10.9* 06/14/2015 1338   HCT 31.0* 06/14/2015 1338   PLT 91* 06/14/2015 1338   MCV 89.3 06/14/2015 1338   MCH 31.4 06/14/2015 1338   MCHC 35.2 06/14/2015 1338   RDW 12.5 06/14/2015 1338   LYMPHSABS 1.7 06/10/2015 1237   MONOABS 0.6 06/10/2015 1237   EOSABS 0.2 06/10/2015 1237   BASOSABS 0.0 06/10/2015 1237     BMET    Component Value Date/Time   NA 141 06/14/2015 1336   K 3.4* 06/14/2015 1336   CL 102 06/14/2015 1124   CO2 26 06/13/2015 0324   GLUCOSE 103* 06/14/2015 1336   BUN 12 06/14/2015 1124   CREATININE 0.80 06/14/2015 1124   CALCIUM 8.9 06/13/2015 0324   GFRNONAA >60 06/13/2015 0324   GFRAA >60 06/13/2015 0324     A/P:  Stable postop course. Continue current plans

## 2015-06-15 ENCOUNTER — Ambulatory Visit: Payer: Medicare Other | Admitting: Cardiology

## 2015-06-15 ENCOUNTER — Inpatient Hospital Stay (HOSPITAL_COMMUNITY): Payer: Medicare Other

## 2015-06-15 ENCOUNTER — Encounter (HOSPITAL_COMMUNITY): Payer: Self-pay | Admitting: Cardiothoracic Surgery

## 2015-06-15 LAB — POCT I-STAT 3, ART BLOOD GAS (G3+)
ACID-BASE DEFICIT: 2 mmol/L (ref 0.0–2.0)
Acid-Base Excess: 1 mmol/L (ref 0.0–2.0)
BICARBONATE: 26.4 meq/L — AB (ref 20.0–24.0)
BICARBONATE: 25.5 meq/L — AB (ref 20.0–24.0)
Bicarbonate: 23.5 mEq/L (ref 20.0–24.0)
O2 SAT: 96 %
O2 Saturation: 94 %
O2 Saturation: 97 %
PCO2 ART: 48 mmHg — AB (ref 35.0–45.0)
PO2 ART: 85 mmHg (ref 80.0–100.0)
PO2 ART: 85 mmHg (ref 80.0–100.0)
Patient temperature: 38.3
Patient temperature: 38.7
TCO2: 25 mmol/L (ref 0–100)
TCO2: 27 mmol/L (ref 0–100)
TCO2: 28 mmol/L (ref 0–100)
pCO2 arterial: 50.2 mmHg — ABNORMAL HIGH (ref 35.0–45.0)
pCO2 arterial: 43.7 mmHg (ref 35.0–45.0)
pH, Arterial: 7.305 — ABNORMAL LOW (ref 7.350–7.450)
pH, Arterial: 7.335 — ABNORMAL LOW (ref 7.350–7.450)
pH, Arterial: 7.377 (ref 7.350–7.450)
pO2, Arterial: 101 mmHg — ABNORMAL HIGH (ref 80.0–100.0)

## 2015-06-15 LAB — POCT I-STAT, CHEM 8
BUN: 13 mg/dL (ref 6–20)
CALCIUM ION: 1.18 mmol/L (ref 1.13–1.30)
CHLORIDE: 103 mmol/L (ref 101–111)
CREATININE: 1.1 mg/dL (ref 0.61–1.24)
Glucose, Bld: 143 mg/dL — ABNORMAL HIGH (ref 65–99)
HCT: 25 % — ABNORMAL LOW (ref 39.0–52.0)
Hemoglobin: 8.5 g/dL — ABNORMAL LOW (ref 13.0–17.0)
POTASSIUM: 3.7 mmol/L (ref 3.5–5.1)
Sodium: 139 mmol/L (ref 135–145)
TCO2: 24 mmol/L (ref 0–100)

## 2015-06-15 LAB — GLUCOSE, CAPILLARY
GLUCOSE-CAPILLARY: 102 mg/dL — AB (ref 65–99)
GLUCOSE-CAPILLARY: 103 mg/dL — AB (ref 65–99)
GLUCOSE-CAPILLARY: 106 mg/dL — AB (ref 65–99)
GLUCOSE-CAPILLARY: 108 mg/dL — AB (ref 65–99)
GLUCOSE-CAPILLARY: 111 mg/dL — AB (ref 65–99)
GLUCOSE-CAPILLARY: 111 mg/dL — AB (ref 65–99)
GLUCOSE-CAPILLARY: 113 mg/dL — AB (ref 65–99)
GLUCOSE-CAPILLARY: 125 mg/dL — AB (ref 65–99)
GLUCOSE-CAPILLARY: 135 mg/dL — AB (ref 65–99)
GLUCOSE-CAPILLARY: 141 mg/dL — AB (ref 65–99)
GLUCOSE-CAPILLARY: 143 mg/dL — AB (ref 65–99)
GLUCOSE-CAPILLARY: 146 mg/dL — AB (ref 65–99)
Glucose-Capillary: 103 mg/dL — ABNORMAL HIGH (ref 65–99)
Glucose-Capillary: 106 mg/dL — ABNORMAL HIGH (ref 65–99)
Glucose-Capillary: 107 mg/dL — ABNORMAL HIGH (ref 65–99)
Glucose-Capillary: 114 mg/dL — ABNORMAL HIGH (ref 65–99)
Glucose-Capillary: 119 mg/dL — ABNORMAL HIGH (ref 65–99)
Glucose-Capillary: 140 mg/dL — ABNORMAL HIGH (ref 65–99)
Glucose-Capillary: 142 mg/dL — ABNORMAL HIGH (ref 65–99)
Glucose-Capillary: 144 mg/dL — ABNORMAL HIGH (ref 65–99)
Glucose-Capillary: 154 mg/dL — ABNORMAL HIGH (ref 65–99)
Glucose-Capillary: 181 mg/dL — ABNORMAL HIGH (ref 65–99)
Glucose-Capillary: 91 mg/dL (ref 65–99)

## 2015-06-15 LAB — BASIC METABOLIC PANEL
Anion gap: 7 (ref 5–15)
BUN: 11 mg/dL (ref 6–20)
CALCIUM: 8.2 mg/dL — AB (ref 8.9–10.3)
CO2: 27 mmol/L (ref 22–32)
Chloride: 107 mmol/L (ref 101–111)
Creatinine, Ser: 1.21 mg/dL (ref 0.61–1.24)
GFR, EST NON AFRICAN AMERICAN: 59 mL/min — AB (ref >60)
Glucose, Bld: 111 mg/dL — ABNORMAL HIGH (ref 65–99)
Potassium: 4.2 mmol/L (ref 3.5–5.1)
SODIUM: 141 mmol/L (ref 135–145)

## 2015-06-15 LAB — CARBOXYHEMOGLOBIN
Carboxyhemoglobin: 0.9 % (ref 0.5–1.5)
Methemoglobin: 1.2 % (ref 0.0–1.5)
O2 Saturation: 69.3 %
Total hemoglobin: 9.5 g/dL — ABNORMAL LOW (ref 13.5–18.0)

## 2015-06-15 LAB — CBC
HEMATOCRIT: 28.1 % — AB (ref 39.0–52.0)
HEMATOCRIT: 26.2 % — AB (ref 39.0–52.0)
HEMOGLOBIN: 9.6 g/dL — AB (ref 13.0–17.0)
HEMOGLOBIN: 8.8 g/dL — AB (ref 13.0–17.0)
MCH: 30.6 pg (ref 26.0–34.0)
MCH: 30.4 pg (ref 26.0–34.0)
MCHC: 34.2 g/dL (ref 30.0–36.0)
MCHC: 33.6 g/dL (ref 30.0–36.0)
MCV: 89.5 fL (ref 78.0–100.0)
MCV: 90.7 fL (ref 78.0–100.0)
PLATELETS: 94 10*3/uL — AB (ref 150–400)
Platelets: 116 10*3/uL — ABNORMAL LOW (ref 150–400)
RBC: 3.14 MIL/uL — AB (ref 4.22–5.81)
RBC: 2.89 MIL/uL — AB (ref 4.22–5.81)
RDW: 12.9 % (ref 11.5–15.5)
RDW: 13 % (ref 11.5–15.5)
WBC: 11.7 10*3/uL — AB (ref 4.0–10.5)
WBC: 9.4 10*3/uL (ref 4.0–10.5)

## 2015-06-15 LAB — PREPARE FRESH FROZEN PLASMA
Unit division: 0
Unit division: 0

## 2015-06-15 LAB — CREATININE, SERUM
Creatinine, Ser: 1.15 mg/dL (ref 0.61–1.24)
GFR calc Af Amer: >60 mL/min (ref >60)
GFR calc non Af Amer: >60 mL/min (ref >60)

## 2015-06-15 LAB — MAGNESIUM
Magnesium: 2.5 mg/dL — ABNORMAL HIGH (ref 1.7–2.4)
Magnesium: 2 mg/dL (ref 1.7–2.4)

## 2015-06-15 MED ORDER — FUROSEMIDE 10 MG/ML IJ SOLN
20.0000 mg | Freq: Two times a day (BID) | INTRAMUSCULAR | Status: DC
  Administered 2015-06-15 – 2015-06-16 (×3): 20 mg via INTRAVENOUS
  Filled 2015-06-15 (×5): qty 2

## 2015-06-15 MED ORDER — POTASSIUM CHLORIDE 10 MEQ/50ML IV SOLN
10.0000 meq | INTRAVENOUS | Status: AC | PRN
  Administered 2015-06-15 (×3): 10 meq via INTRAVENOUS
  Filled 2015-06-15 (×3): qty 50

## 2015-06-15 MED ORDER — METOCLOPRAMIDE HCL 5 MG/ML IJ SOLN
10.0000 mg | Freq: Four times a day (QID) | INTRAMUSCULAR | Status: DC
  Administered 2015-06-15 – 2015-06-19 (×15): 10 mg via INTRAVENOUS
  Filled 2015-06-15 (×19): qty 2

## 2015-06-15 MED ORDER — INSULIN DETEMIR 100 UNIT/ML ~~LOC~~ SOLN
15.0000 [IU] | Freq: Two times a day (BID) | SUBCUTANEOUS | Status: DC
  Administered 2015-06-15 – 2015-06-18 (×7): 15 [IU] via SUBCUTANEOUS
  Filled 2015-06-15 (×9): qty 0.15

## 2015-06-15 MED ORDER — METHYLPREDNISOLONE SODIUM SUCC 125 MG IJ SOLR: 80.0000 mg | Freq: Every day | INTRAMUSCULAR | Status: DC

## 2015-06-15 MED ORDER — INSULIN ASPART 100 UNIT/ML ~~LOC~~ SOLN
0.0000 [IU] | SUBCUTANEOUS | Status: DC
  Administered 2015-06-15 (×2): 2 [IU] via SUBCUTANEOUS
  Administered 2015-06-15: 4 [IU] via SUBCUTANEOUS
  Administered 2015-06-15: 2 [IU] via SUBCUTANEOUS
  Administered 2015-06-16: 4 [IU] via SUBCUTANEOUS
  Administered 2015-06-16: 2 [IU] via SUBCUTANEOUS
  Administered 2015-06-16: 4 [IU] via SUBCUTANEOUS
  Administered 2015-06-16: 2 [IU] via SUBCUTANEOUS

## 2015-06-15 MED ORDER — ACETAMINOPHEN 10 MG/ML IV SOLN
1000.0000 mg | Freq: Once | INTRAVENOUS | Status: AC
  Administered 2015-06-15: 1000 mg via INTRAVENOUS
  Filled 2015-06-15: qty 100

## 2015-06-15 MED ORDER — INSULIN DETEMIR 100 UNIT/ML ~~LOC~~ SOLN
10.0000 [IU] | Freq: Two times a day (BID) | SUBCUTANEOUS | Status: DC
  Administered 2015-06-15: 10 [IU] via SUBCUTANEOUS
  Filled 2015-06-15 (×2): qty 0.1

## 2015-06-15 MED FILL — Potassium Chloride Inj 2 mEq/ML: INTRAVENOUS | Qty: 40 | Status: AC

## 2015-06-15 MED FILL — Magnesium Sulfate Inj 50%: INTRAMUSCULAR | Qty: 10 | Status: AC

## 2015-06-15 MED FILL — Heparin Sodium (Porcine) Inj 1000 Unit/ML: INTRAMUSCULAR | Qty: 30 | Status: AC

## 2015-06-15 NOTE — Progress Notes (Signed)
Pt. Became more lethargic. RT and RN attempted to await of MD to arrive to for assessment. Pt. Placed back on rate of 10 as following protocol. RT and RN continue to await for MD.

## 2015-06-15 NOTE — Care Management Important Message (Signed)
Important Message  Patient Details  Name: Hunter Sparks MRN: [id number] Date of Birth: 1945-12-21   Medicare Important Message Given:  Yes-second notification given    Nathen May 06/15/2015, 1:06 [place] Message  Patient Details  Name: Hunter Sparks MRN: [id number] Date of Birth: 1946-06-18   Medicare Important Message Given:  Yes-second notification given    Nathen May 06/15/2015, 1:06 PM

## 2015-06-15 NOTE — Op Note (Signed)
Hunter Sparks, Hunter Sparks NO.:  [id number]  MEDICAL RECORD NO.:  [id number]  LOCATION:  2S11C                        FACILITY:  [facility]  PHYSICIAN:  Ivin Poot, M.D.  DATE OF BIRTH:  11/28/1945  DATE OF PROCEDURE:  06/14/2015 DATE OF DISCHARGE:                              OPERATIVE REPORT   OPERATION: 1. Coronary artery bypass grafting x4 (left internal mammary artery to     left anterior descending, saphenous vein graft to first diagonal,     saphenous vein graft to OM-2, saphenous vein graft to posterior     descending). 2. Endoscopic harvest of right leg greater saphenous vein.  SURGEON:  Ivin Poot, M.D.  ASSISTANT:  Providence Crosby, PA-C.  PREOPERATIVE DIAGNOSIS:  Severe three-vessel coronary artery disease with unstable angina.  POSTOPERATIVE DIAGNOSIS:  Severe three-vessel coronary artery disease with unstable angina.  ANESTHESIA:  General by Dr. Albertha Ghee.  INDICATIONS:  The patient is a 69 year old diabetic ex-smoker who presents with unstable angina and negative cardiac enzymes.  Cardiac catheterizations did demonstrate severe multivessel coronary artery disease.  Ejection fraction is normal.  Echocardiogram shows no significant valvular disorder.  He was felt to be a candidate for multivessel CABG.  Prior to surgery, I reviewed the results of the cardiac catheterization with the patient and his family.  I discussed the indications and expected benefits of multivessel CABG for treatment of his three-vessel CAD.  I discussed the major aspects of the operation including the location of the surgical incisions, the use of general anesthesia and cardiopulmonary bypass, and the expected postoperative hospital recovery.  I discussed with the patient the direct risks of the operation including risks of stroke, MI, bleeding, wound infection, blood transfusion requirement, postoperative pulmonary problems including pleural effusions, and  death.  After reviewing these issues, he demonstrated his understanding and agreed to proceed with surgery under what I felt was an informed consent.  OPERATIVE FINDINGS: 1. Adequate conduit. 2. No blood products required for the surgery. 3. Adequate targets for the LAD and OM, small targets for the     posterior descending.  All vessels heavily diseased.  DESCRIPTION OF PROCEDURE:  The patient was brought to the operating room and placed supine on the operating room table.  General anesthesia was induced under invasive hemodynamic monitoring.  The chest, abdomen, and legs were prepped with Betadine and draped as a sterile field.  A proper time-out was performed.  A sternal incision was made as the saphenous vein was harvested endoscopically from the right leg.  The left internal mammary artery was harvested as a pedicle graft from its origin at the subclavian vessels.  It was a good vessel with excellent flow.  The sternal retractor was placed and pericardium was opened and suspended. Pursestrings were placed in the ascending aorta and right atrium and heparin was administered.  After the ACT was documented as being therapeutic, the patient was cannulated and placed on cardiopulmonary bypass.  The coronaries were identified for grafting and the mammary artery and vein grafts were prepared for the distal anastomoses. Cardioplegia cannulas were placed for both antegrade and retrograde cold blood cardioplegia.  The patient was cooled to 32 degrees.  The aortic crossclamp was applied.  One liter of cold blood cardioplegia was delivered in split doses between the antegrade aortic and retrograde coronary sinus catheters.  There was good cardioplegic arrest and septal temperature dropped to less than 12 degrees.  Cardioplegia was delivered every 20 minutes while the crossclamp was in place.  The distal coronary anastomoses were performed.  The first distal anastomosis was the posterior  descending.  This was a small 1.2-mm vessel with proximal 95% stenosis.  A reverse saphenous vein was sewn end-to-side with running 7-0 Prolene with good flow through the graft. Cardioplegia was redosed.  The second distal anastomosis was the OM-2 branch of left circumflex. This was a large 1.7-mm vessel with proximal 90% stenosis.  A reverse saphenous vein was sewn end-to-side with running 7-0 Prolene with good flow through the graft.  Cardioplegia was redosed.  The third distal anastomosis was to the large 1.5 mm first diagonal with a proximal 90% stenosis.  A reverse saphenous vein was sewn end-to-side with running 7-0 Prolene with good flow through the graft.  Cardioplegia was redosed.  The fourth distal anastomosis was to the distal third of the LAD.  It had a proximal 95% stenosis.  The left IMA pedicle was brought through an opening and the left lateral pericardium was brought down onto the LAD and sewn end-to-side with running 8-0 Prolene.  There was good flow through the anastomosis after briefly releasing the pedicle bulldog on the mammary artery.  The bulldog was reapplied, and the pedicle was secured to the epicardium with 6-0 Prolene.  Cardioplegia was redosed.  While the crossclamp was in place, three proximal vein anastomoses were performed on the ascending aorta using a 4.5 mm punch running 6-0 Prolene.  Prior to tying down the final proximal anastomosis, air was vented from the coronaries with a dose of retrograde warm blood cardioplegia.  The crossclamp was removed.  The heart resumed a spontaneous rhythm.  The vein grafts were de-aired and opened and each had good flow.  Hemostasis was documented at the proximal and distal anastomoses.  Cardioplegia cannulas were removed. The patient was rewarmed and reperfused.  Temporary pacing wires were applied.  When the patient was adequately rewarmed and reperfused, the lungs were expanded and the ventilator was resumed.   The patient was weaned off cardiopulmonary bypass without inotropes.  Echo showed preserved LV systolic function.  Protamine was administered without adverse reaction.  The cannulas were removed.  The mediastinum was examined and hemostasis was achieved.  The superior pericardial fat was closed over the ascending aorta.  Anterior mediastinal and left pleural chest tube were placed and brought out through separate incisions.  The sternum was closed with interrupted steel wire.  The pectoralis fascia was closed in running #1 Vicryl.  The subcutaneous and skin layers were closed in running Vicryl and sterile dressings were applied.  Total cardiopulmonary bypass time was 121 minutes.     Ivin Poot, M.D.     PV/MEDQ  D:  06/14/2015  T:  06/15/2015  Job:  [id number]

## 2015-06-15 NOTE — Progress Notes (Signed)
Wean initiated. RT to retrieve parameters.

## 2015-06-15 NOTE — Progress Notes (Signed)
CT surgery p.m. Rounds Patient examined and record reviewed.Hemodynamics stable,labs satisfactory.Patient had stable day.Continue current care. Hunter Sparks 06/15/2015

## 2015-06-15 NOTE — Progress Notes (Signed)
1 Day Post-Op Procedure(s) (LRB): CORONARY ARTERY BYPASS GRAFTING times four using Left Internal mammary artery and right leg Saphenous vein graft (N/A) TRANSESOPHAGEAL ECHOCARDIOGRAM (TEE) (N/A) Subjective: Stable after CABG x 4 Nsr, neuro intact but somnolent Objective: Vital signs in last 24 hours: Temp:  [95 F (35 C)-101.7 F (38.7 C)] 99.5 F (37.5 C) (08/02 0715) Pulse Rate:  [87-126] 90 (08/02 0715) Cardiac Rhythm:  [-] Normal sinus rhythm (08/02 0400) Resp:  [0-30] 0 (08/02 0715) BP: (81-147)/(47-84) 103/62 mmHg (08/02 0700) SpO2:  [95 %-100 %] 98 % (08/02 0715) FiO2 (%):  [40 %-50 %] 40 % (08/02 0400) Weight:  [189 lb 1.6 oz (85.775 kg)] 189 lb 1.6 oz (85.775 kg) (08/02 0500)  Hemodynamic parameters for last 24 hours: PAP: (15-39)/(8-29) 15/8 mmHg CO:  [3.1 L/min-6.2 L/min] 6.2 L/min CI:  [1.5 L/min/m2-3 L/min/m2] 3 L/min/m2  Intake/Output from previous day: 08/01 0701 - 08/02 0700 In: 6587.3 [I.V.:3970.1; Blood:947.2; NG/GT:20; IV Piggyback:1650] Out: 6510 [Urine:5135; Blood:800; Chest Tube:575] Intake/Output this shift:   EXAM No air leak extrem warm Abdomen soft  Lab Results:  Recent Labs  06/14/15 2005 06/15/15 0410  WBC 10.1 11.7*  HGB 9.9* 9.6*  HCT 28.6* 28.1*  PLT 119* 116*   BMET:  Recent Labs  06/13/15 0324  06/14/15 2004 06/14/15 2005 06/15/15 0410  NA 140  < > 142  --  141  K 4.0  < > 4.6  --  4.2  CL 108  < > 106  --  107  CO2 26  --   --   --  27  GLUCOSE 112*  < > 161*  --  111*  BUN 16  < > 12  --  11  CREATININE 1.19  < > 1.00 1.13 1.21  CALCIUM 8.9  --   --   --  8.2*  < > = values in this interval not displayed.  PT/INR:  Recent Labs  06/14/15 1338  LABPROT 18.2*  INR 1.50*   ABG    Component Value Date/Time   PHART 7.377 06/15/2015 0559   HCO3 25.5* 06/15/2015 0559   TCO2 27 06/15/2015 0559   ACIDBASEDEF 2.0 06/14/2015 2359   O2SAT 96.0 06/15/2015 0559   CBG (last 3)   Recent Labs  06/15/15 0201  06/15/15 0303 06/15/15 0409  GLUCAP 119* 102* 111*    Assessment/Plan: S/P Procedure(s) (LRB): CORONARY ARTERY BYPASS GRAFTING times four using Left Internal mammary artery and right leg Saphenous vein graft (N/A) TRANSESOPHAGEAL ECHOCARDIOGRAM (TEE) (N/A) Mobilize Diuresis Diabetes control d/c tubes/lines See progression orders   LOS: 5 days    Tharon Aquas Trigt III 06/15/2015

## 2015-06-15 NOTE — Procedures (Signed)
Extubation Procedure Note  Patient Details:   Name: Hunter Sparks DOB: [birth year] MRN: [id number]   Airway Documentation:  Airway 8 mm (Active)  Secured at (cm) 24 cm 06/15/2015  3:56 AM  Measured From Lips 06/15/2015  3:56 AM  Secured Location Right 06/15/2015  3:56 AM  Secured By Rana Snare Tape 06/15/2015  3:56 AM  Cuff Pressure (cm H2O) 25 cm H2O 06/14/2015  7:53 PM  Site Condition Dry 06/14/2015  1:28 PM    Evaluation  O2 sats: stable throughout Complications: No apparent complications Patient did tolerate procedure well. Bilateral Breath Sounds: Clear, Diminished   Yes   Pt. Extubated to 4L nasal cannula. Pt. Performed -24 on the NIF and .9L on the vital capacity prior to extubation. Pt. Had a positive cuff leak and was able to speak after extubation. Pt. Performed 500x 5 on the incentive and also 750 x3.   Marlowe Aschoff 06/15/2015, 4:26 AM

## 2015-06-15 NOTE — Care Management Note (Signed)
Case Management Note  Patient Details  Name: Hunter Sparks MRN: [id number] Date of Birth: [birth year]  Subjective/Objective:      Lives at home with wife, independent prior. Plan for discharge home with wife.               Action/Plan:   Expected Discharge Date:                  Expected Discharge Plan:  Home/Self Care  In-House Referral:     Discharge planning Services  CM Consult  Post Acute Care Choice:    Choice offered to:     DME Arranged:    DME Agency:     HH Arranged:    HH Agency:     Status of Service:  In process, will continue to follow  Medicare Important Message Given:  Yes-second notification given Date Medicare IM Given:    Medicare IM give by:    Date Additional Medicare IM Given:    Additional Medicare Important Message give by:     If discussed at [place] of Stay Meetings, dates discussed:    Additional Comments:  Vergie Living, RN 06/15/2015, 3:33 PM

## 2015-06-15 NOTE — Progress Notes (Signed)
Attempted wean, ABG, NIF and VC within normal limits, but pt more lethargic right before extubation.  Dr. Cyndia Bent made aware when up to unit.  MD ordered SIMV rate of 10 until patient awakens, then resume with CPAP/PS and extubation.  Vista Lawman, RN

## 2015-06-16 ENCOUNTER — Inpatient Hospital Stay (HOSPITAL_COMMUNITY): Payer: Medicare Other

## 2015-06-16 LAB — BASIC METABOLIC PANEL
Anion gap: 7 (ref 5–15)
BUN: 17 mg/dL (ref 6–20)
CO2: 30 mmol/L (ref 22–32)
Calcium: 8.4 mg/dL — ABNORMAL LOW (ref 8.9–10.3)
Chloride: 103 mmol/L (ref 101–111)
Creatinine, Ser: 1.29 mg/dL — ABNORMAL HIGH (ref 0.61–1.24)
GFR calc Af Amer: >60 mL/min (ref >60)
GFR calc non Af Amer: 55 mL/min — ABNORMAL LOW (ref >60)
Glucose, Bld: 109 mg/dL — ABNORMAL HIGH (ref 65–99)
Potassium: 4 mmol/L (ref 3.5–5.1)
Sodium: 140 mmol/L (ref 135–145)

## 2015-06-16 LAB — GLUCOSE, CAPILLARY
Glucose-Capillary: 110 mg/dL — ABNORMAL HIGH (ref 65–99)
Glucose-Capillary: 125 mg/dL — ABNORMAL HIGH (ref 65–99)
Glucose-Capillary: 156 mg/dL — ABNORMAL HIGH (ref 65–99)
Glucose-Capillary: 178 mg/dL — ABNORMAL HIGH (ref 65–99)
Glucose-Capillary: 167 mg/dL — ABNORMAL HIGH (ref 65–99)

## 2015-06-16 LAB — CBC
HCT: 25.5 % — ABNORMAL LOW (ref 39.0–52.0)
Hemoglobin: 8.6 g/dL — ABNORMAL LOW (ref 13.0–17.0)
MCH: 30.9 pg (ref 26.0–34.0)
MCHC: 33.7 g/dL (ref 30.0–36.0)
MCV: 91.7 fL (ref 78.0–100.0)
Platelets: 96 10*3/uL — ABNORMAL LOW (ref 150–400)
RBC: 2.78 MIL/uL — ABNORMAL LOW (ref 4.22–5.81)
RDW: 13.1 % (ref 11.5–15.5)
WBC: 9.7 10*3/uL (ref 4.0–10.5)

## 2015-06-16 MED ORDER — FUROSEMIDE 10 MG/ML IJ SOLN
20.0000 mg | Freq: Every day | INTRAMUSCULAR | Status: DC
  Administered 2015-06-17: 20 mg via INTRAVENOUS
  Filled 2015-06-16: qty 2

## 2015-06-16 MED ORDER — POTASSIUM CHLORIDE 10 MEQ/50ML IV SOLN
10.0000 meq | INTRAVENOUS | Status: AC
  Administered 2015-06-16 (×2): 10 meq via INTRAVENOUS
  Filled 2015-06-16 (×2): qty 50

## 2015-06-16 MED ORDER — TAMSULOSIN HCL 0.4 MG PO CAPS
0.4000 mg | ORAL_CAPSULE | Freq: Every day | ORAL | Status: DC
  Administered 2015-06-16 – 2015-06-17 (×2): 0.4 mg via ORAL
  Filled 2015-06-16 (×4): qty 1

## 2015-06-16 MED ORDER — SIMETHICONE 80 MG PO CHEW
80.0000 mg | CHEWABLE_TABLET | Freq: Four times a day (QID) | ORAL | Status: DC | PRN
  Filled 2015-06-16: qty 1

## 2015-06-16 MED ORDER — ALPRAZOLAM 0.25 MG PO TABS
0.2500 mg | ORAL_TABLET | Freq: Two times a day (BID) | ORAL | Status: DC | PRN
  Administered 2015-06-17 – 2015-06-19 (×2): 0.25 mg via ORAL
  Filled 2015-06-16 (×2): qty 1

## 2015-06-16 MED ORDER — ALBUMIN HUMAN 25 % IV SOLN
12.5000 g | Freq: Once | INTRAVENOUS | Status: AC
  Administered 2015-06-16: 12.5 g via INTRAVENOUS
  Filled 2015-06-16: qty 50

## 2015-06-16 MED ORDER — FUROSEMIDE 10 MG/ML IJ SOLN
INTRAMUSCULAR | Status: AC
  Administered 2015-06-16: 20 mg
  Filled 2015-06-16: qty 2

## 2015-06-16 NOTE — Progress Notes (Signed)
2 Days Post-Op Procedure(s) (LRB): CORONARY ARTERY BYPASS GRAFTING times four using Left Internal mammary artery and right leg Saphenous vein graft (N/A) TRANSESOPHAGEAL ECHOCARDIOGRAM (TEE) (N/A) Subjective:  OOB to chair nsr Remains groggy, very weak Still on low dose neo for BP  Objective: Vital signs in last 24 hours: Temp:  [97.7 F (36.5 C)-98.8 F (37.1 C)] 97.7 F (36.5 C) (08/03 0801) Pulse Rate:  [85-112] 104 (08/03 0700) Cardiac Rhythm:  [-] Normal sinus rhythm (08/03 0400) Resp:  [6-26] 11 (08/03 0700) BP: (83-154)/(44-96) 114/63 mmHg (08/03 0700) SpO2:  [91 %-100 %] 93 % (08/03 0700) Weight:  [185 lb 8 oz (84.142 kg)] 185 lb 8 oz (84.142 kg) (08/03 0600)  Hemodynamic parameters for last 24 hours:  weaning neo  Intake/Output from previous day: 08/02 0701 - 08/03 0700 In: 1235.4 [P.O.:60; I.V.:675.4; IV Piggyback:500] Out: 1245 [Urine:3170; Chest Tube:260] Intake/Output this shift:    Ecchymosis R groin w/o tenderness , hematoma  Lab Results:  Recent Labs  06/15/15 1635 06/16/15 0445  WBC 9.4 9.7  HGB 8.8* 8.6*  HCT 26.2* 25.5*  PLT 94* 96*   BMET:  Recent Labs  06/15/15 0410 06/15/15 1634 06/15/15 1635 06/16/15 0445  NA 141 139  --  140  K 4.2 3.7  --  4.0  CL 107 103  --  103  CO2 27  --   --  30  GLUCOSE 111* 143*  --  109*  BUN 11 13  --  17  CREATININE 1.21 1.10 1.15 1.29*  CALCIUM 8.2*  --   --  8.4*    PT/INR:  Recent Labs  06/14/15 1338  LABPROT 18.2*  INR 1.50*   ABG    Component Value Date/Time   PHART 7.377 06/15/2015 0559   HCO3 25.5* 06/15/2015 0559   TCO2 24 06/15/2015 1634   ACIDBASEDEF 2.0 06/14/2015 2359   O2SAT 96.0 06/15/2015 0559   CBG (last 3)   Recent Labs  06/15/15 1918 06/15/15 2335 06/16/15 0357  GLUCAP 181* 141* 110*    Assessment/Plan: S/P Procedure(s) (LRB): CORONARY ARTERY BYPASS GRAFTING times four using Left Internal mammary artery and right leg Saphenous vein graft  (N/A) TRANSESOPHAGEAL ECHOCARDIOGRAM (TEE) (N/A) Mobilize Diuresis Diabetes control d/c tubes/lines  PT consult  Wean neo   LOS: 6 days    Tharon Aquas Trigt III 06/16/2015

## 2015-06-16 NOTE — Progress Notes (Signed)
      [address]       [place] [address]             [phone]       POD # 2 CABG x 4  Up in chair, still generally weak  BP 125/97 mmHg  Pulse 119  Temp(Src) 97.9 F (36.6 C) (Oral)  Resp 17  Ht 6' (1.829 m)  Wt 185 lb 8 oz (84.142 kg)  BMI 25.15 kg/m2  SpO2 95% 95 % sat on 1 L   Intake/Output Summary (Last 24 hours) at 06/16/15 1855 Last data filed at 06/16/15 1848  Gross per 24 hour  Intake 584.75 ml  Output   1830 ml  Net -1245.25 ml   No PM labs  Continue current care  Keira Bohlin C. Roxan Hockey, MD [facility] [phone]

## 2015-06-17 ENCOUNTER — Inpatient Hospital Stay (HOSPITAL_COMMUNITY): Payer: Medicare Other

## 2015-06-17 LAB — BASIC METABOLIC PANEL
Anion gap: 6 (ref 5–15)
BUN: 19 mg/dL (ref 6–20)
CO2: 31 mmol/L (ref 22–32)
Calcium: 8.3 mg/dL — ABNORMAL LOW (ref 8.9–10.3)
Chloride: 100 mmol/L — ABNORMAL LOW (ref 101–111)
Creatinine, Ser: 1.12 mg/dL (ref 0.61–1.24)
GFR calc Af Amer: >60 mL/min (ref >60)
GFR calc non Af Amer: >60 mL/min (ref >60)
Glucose, Bld: 113 mg/dL — ABNORMAL HIGH (ref 65–99)
Potassium: 3.7 mmol/L (ref 3.5–5.1)
Sodium: 137 mmol/L (ref 135–145)

## 2015-06-17 LAB — GLUCOSE, CAPILLARY
GLUCOSE-CAPILLARY: 107 mg/dL — AB (ref 65–99)
GLUCOSE-CAPILLARY: 116 mg/dL — AB (ref 65–99)
GLUCOSE-CAPILLARY: 116 mg/dL — AB (ref 65–99)
Glucose-Capillary: 159 mg/dL — ABNORMAL HIGH (ref 65–99)
Glucose-Capillary: 137 mg/dL — ABNORMAL HIGH (ref 65–99)
Glucose-Capillary: 119 mg/dL — ABNORMAL HIGH (ref 65–99)

## 2015-06-17 LAB — CBC
HCT: 24.1 % — ABNORMAL LOW (ref 39.0–52.0)
Hemoglobin: 8.1 g/dL — ABNORMAL LOW (ref 13.0–17.0)
MCH: 30.5 pg (ref 26.0–34.0)
MCHC: 33.6 g/dL (ref 30.0–36.0)
MCV: 90.6 fL (ref 78.0–100.0)
Platelets: 101 10*3/uL — ABNORMAL LOW (ref 150–400)
RBC: 2.66 MIL/uL — ABNORMAL LOW (ref 4.22–5.81)
RDW: 13 % (ref 11.5–15.5)
WBC: 8 10*3/uL (ref 4.0–10.5)

## 2015-06-17 MED ORDER — POTASSIUM CHLORIDE 10 MEQ/50ML IV SOLN
INTRAVENOUS | Status: AC
  Administered 2015-06-17: 10 meq via INTRAVENOUS
  Filled 2015-06-17: qty 50

## 2015-06-17 MED ORDER — MAGNESIUM HYDROXIDE 400 MG/5ML PO SUSP: 30.0000 mL | Freq: Every day | ORAL | Status: DC | PRN

## 2015-06-17 MED ORDER — SODIUM CHLORIDE 0.9 % IV SOLN: 250.0000 mL | INTRAVENOUS | Status: DC | PRN

## 2015-06-17 MED ORDER — POTASSIUM CHLORIDE 10 MEQ/50ML IV SOLN
10.0000 meq | INTRAVENOUS | Status: AC
  Administered 2015-06-17 (×3): 10 meq via INTRAVENOUS
  Filled 2015-06-17 (×2): qty 50

## 2015-06-17 MED ORDER — ALPRAZOLAM 0.25 MG PO TABS: 0.2500 mg | ORAL_TABLET | Freq: Four times a day (QID) | ORAL | Status: DC | PRN

## 2015-06-17 MED ORDER — MOVING RIGHT ALONG BOOK
Freq: Once | Status: AC
  Administered 2015-06-17: 1
  Filled 2015-06-17: qty 1

## 2015-06-17 MED ORDER — ALUM & MAG HYDROXIDE-SIMETH 200-200-20 MG/5ML PO SUSP: 15.0000 mL | ORAL | Status: DC | PRN

## 2015-06-17 MED ORDER — GUAIFENESIN-DM 100-10 MG/5ML PO SYRP: 15.0000 mL | ORAL_SOLUTION | ORAL | Status: DC | PRN

## 2015-06-17 MED ORDER — SODIUM CHLORIDE 0.9 % IJ SOLN
3.0000 mL | Freq: Two times a day (BID) | INTRAMUSCULAR | Status: DC
  Administered 2015-06-17 – 2015-06-18 (×4): 3 mL via INTRAVENOUS

## 2015-06-17 MED ORDER — FUROSEMIDE 40 MG PO TABS
40.0000 mg | ORAL_TABLET | Freq: Every day | ORAL | Status: DC
  Administered 2015-06-17: 40 mg via ORAL
  Filled 2015-06-17 (×2): qty 1

## 2015-06-17 MED ORDER — INSULIN ASPART 100 UNIT/ML ~~LOC~~ SOLN
0.0000 [IU] | Freq: Three times a day (TID) | SUBCUTANEOUS | Status: DC
  Administered 2015-06-17: 2 [IU] via SUBCUTANEOUS
  Administered 2015-06-17: 3 [IU] via SUBCUTANEOUS
  Administered 2015-06-18 – 2015-06-19 (×2): 2 [IU] via SUBCUTANEOUS

## 2015-06-17 MED ORDER — POTASSIUM CHLORIDE CRYS ER 20 MEQ PO TBCR
20.0000 meq | EXTENDED_RELEASE_TABLET | Freq: Two times a day (BID) | ORAL | Status: DC
  Administered 2015-06-17 – 2015-06-18 (×4): 20 meq via ORAL
  Filled 2015-06-17 (×6): qty 1

## 2015-06-17 MED ORDER — SODIUM CHLORIDE 0.9 % IJ SOLN: 3.0000 mL | INTRAMUSCULAR | Status: DC | PRN

## 2015-06-17 NOTE — Evaluation (Signed)
Physical Therapy Evaluation Patient Details Name: Hunter Sparks MRN: [id number] DOB: 1946-11-13 Today's Date: 06/17/2015   History of Present Illness  Pt is a 69 y/o male who presents s/p CABG x4 on 06/14/15.  Clinical Impression  Pt admitted with above diagnosis. Pt currently with functional limitations due to the deficits listed below (see PT Problem List). At the time of PT eval pt was able to perform transfers and ambulation with min guard to supervision for safety. Pt moving very slowly however states this is his baseline. Is able to increase step/stride length with cueing and maintain for ~20 feet at a time. Pt will benefit from skilled PT to increase their independence and safety with mobility to allow discharge to the venue listed below.       Follow Up Recommendations Home health PT;Supervision/Assistance - 24 hour (With transition to cardiac rehab)    Equipment Recommendations  Rolling walker with 5" wheels    Recommendations for Other Services       Precautions / Restrictions Precautions Precautions: Fall;Sternal Precaution Comments: Reviewed sternal precautions. Pt unable to teach-back.  Restrictions Weight Bearing Restrictions: Yes (Sternal precautions)      Mobility  Bed Mobility               General bed mobility comments: Pt sitting up in recliner upon PT arrival, and pt returned to the chair at end of session.   Transfers Overall transfer level: Needs assistance Equipment used: Rolling walker (2 wheeled) Transfers: Sit to/from Stand Sit to Stand: Min guard         General transfer comment: Close guard for safety as pt powered-up to full standing position. No usnteadiness noted.   Ambulation/Gait Ambulation/Gait assistance: Min guard;Supervision Ambulation Distance (Feet): 300 Feet Assistive device: Rolling walker (2 wheeled) Gait Pattern/deviations: Step-through pattern;Decreased stride length;Trunk flexed Gait velocity: Decreased Gait velocity  interpretation: Below normal speed for age/gender General Gait Details: Initially min guard provided for safety however pt progressed to supervision. Chair follow was utilized however pt declined any rest breaks.   Stairs            Wheelchair Mobility    Modified Rankin (Stroke Patients Only)       Balance Overall balance assessment: Needs assistance Sitting-balance support: Feet supported;No upper extremity supported Sitting balance-Leahy Scale: Fair     Standing balance support: No upper extremity supported Standing balance-Leahy Scale: Fair Standing balance comment: Requires UE support for dynamic balance activity.                              Pertinent Vitals/Pain Pain Assessment: No/denies pain    Home Living Family/patient expects to be discharged to:: Private residence Living Arrangements: Spouse/significant other Available Help at Discharge: Family;Available 24 hours/day Type of Home: House Home Access: Level entry     Home Layout: One level Home Equipment: None      Prior Function Level of Independence: Independent         Comments: Pt reports he always has walked slow due to LLE being shorter than the RLE.      Hand Dominance        Extremity/Trunk Assessment   Upper Extremity Assessment: Defer to OT evaluation           Lower Extremity Assessment: Overall WFL for tasks assessed;LLE deficits/detail   LLE Deficits / Details: Pt reports LLE is shorter than the right. May have been the reason the RLE  appeared flexed during weightbearing.  Cervical / Trunk Assessment: Other exceptions  Communication   Communication: No difficulties  Cognition Arousal/Alertness: Awake/alert Behavior During Therapy: Flat affect Overall Cognitive Status: Within Functional Limits for tasks assessed       Memory: Decreased recall of precautions              General Comments      Exercises        Assessment/Plan    PT  Assessment Patient needs continued PT services  PT Diagnosis Difficulty walking;Generalized weakness   PT Problem List Decreased strength;Decreased range of motion;Decreased activity tolerance;Decreased balance;Decreased mobility;Decreased knowledge of use of DME;Decreased safety awareness;Decreased knowledge of precautions;Pain  PT Treatment Interventions DME instruction;Gait training;Stair training;Functional mobility training;Therapeutic activities;Therapeutic exercise;Neuromuscular re-education;Patient/family education   PT Goals (Current goals can be found in the Care Plan section) Acute Rehab PT Goals Patient Stated Goal: None stated PT Goal Formulation: With patient Time For Goal Achievement: 06/24/15 Potential to Achieve Goals: Good    Frequency Min 3X/week   Barriers to discharge        Co-evaluation               End of Session Equipment Utilized During Treatment: Gait belt Activity Tolerance: Patient tolerated treatment well Patient left: in chair;with call bell/phone within reach Nurse Communication: Mobility status         Time: 0820-0838 PT Time Calculation (min) (ACUTE ONLY): 18 min   Charges:   PT Evaluation $Initial PT Evaluation Tier I: 1 Procedure     PT G CodesRolinda Roan Jul 17, 2015, 11:19 AM  Rolinda Roan, PT, DPT Acute Rehabilitation Services Pager: [phone]

## 2015-06-17 NOTE — Progress Notes (Addendum)
[facility] DAILY ICU PROGRESS NOTE                   [address]            [place] [address]          [phone]   3 Days Post-Op Procedure(s) (LRB): CORONARY ARTERY BYPASS GRAFTING times four using Left Internal mammary artery and right leg Saphenous vein graft (N/A) TRANSESOPHAGEAL ECHOCARDIOGRAM (TEE) (N/A)  Total Length of Stay:  LOS: 7 days   Subjective: Just back from walking with PT. Feels well, no dizziness or SOB.  "I'm ready to go home!"   Objective: Vital signs in last 24 hours: Temp:  [97.6 F (36.4 C)-99.5 F (37.5 C)] 98.1 F (36.7 C) (08/04 0400) Pulse Rate:  [85-122] 85 (08/04 0530) Cardiac Rhythm:  [-] Normal sinus rhythm (08/04 0000) Resp:  [6-21] 16 (08/04 0700) BP: (80-146)/(41-97) 105/54 mmHg (08/04 0637) SpO2:  [91 %-99 %] 93 % (08/04 0530) Weight:  [181 lb 14.1 oz (82.5 kg)] 181 lb 14.1 oz (82.5 kg) (08/04 0530)  Filed Weights   06/15/15 0500 06/16/15 0600 06/17/15 0530  Weight: 189 lb 1.6 oz (85.775 kg) 185 lb 8 oz (84.142 kg) 181 lb 14.1 oz (82.5 kg)    Weight change: -3 lb 9.9 oz (-1.642 kg)   Hemodynamic parameters for last 24 hours:    Intake/Output from previous day: 08/03 0701 - 08/04 0700 In: 842.6 [P.O.:220; I.V.:622.6] Out: 940 [Urine:910; Chest Tube:30]  CBGs 167-116-107-113-116     Current Meds: Scheduled Meds: . acetaminophen  1,000 mg Oral 4 times per day   Or  . acetaminophen (TYLENOL) oral liquid 160 mg/5 mL  1,000 mg Per Tube 4 times per day  . aspirin EC  325 mg Oral Daily   Or  . aspirin  324 mg Per Tube Daily  . atorvastatin  40 mg Oral q1800  . bisacodyl  10 mg Oral Daily   Or  . bisacodyl  10 mg Rectal Daily  . docusate sodium  200 mg Oral Daily  . furosemide  20 mg Intravenous Daily  . insulin aspart  0-24 Units Subcutaneous 6 times per day  . insulin detemir  15 Units Subcutaneous BID  . levothyroxine  75 mcg Oral QAC breakfast  . metoCLOPramide (REGLAN) injection  10 mg Intravenous 4 times  per day  . metoprolol tartrate  12.5 mg Oral BID   Or  . metoprolol tartrate  12.5 mg Per Tube BID  . pantoprazole  40 mg Oral Daily  . potassium chloride  10 mEq Intravenous Q1 Hr x 3  . sodium chloride  3 mL Intravenous Q12H  . tamsulosin  0.4 mg Oral Daily   Continuous Infusions: . sodium chloride Stopped (06/15/15 0802)  . sodium chloride    . sodium chloride    . lactated ringers 20 mL/hr at 06/16/15 1600  . phenylephrine (NEO-SYNEPHRINE) Adult infusion 20 mcg/min (06/17/15 0700)   PRN Meds:.sodium chloride, ALPRAZolam, metoprolol, ondansetron (ZOFRAN) IV, simethicone, sodium chloride, traMADol  Physical Exam: General appearance: alert, cooperative and no distress Heart: regular rate and rhythm Lungs: clear to auscultation bilaterally Extremities: No significant LE edema Wound: Sternal wound dressed and dry, RLE wounds stable    Lab Results: CBC: Recent Labs  06/16/15 0445 06/17/15 0544  WBC 9.7 8.0  HGB 8.6* 8.1*  HCT 25.5* 24.1*  PLT 96* 101*   BMET:  Recent Labs  06/16/15 0445 06/17/15 0544  NA 140  137  K 4.0 3.7  CL 103 100*  CO2 30 31  GLUCOSE 109* 113*  BUN 17 19  CREATININE 1.29* 1.12  CALCIUM 8.4* 8.3*    PT/INR:  Recent Labs  06/14/15 1338  LABPROT 18.2*  INR 1.50*   Radiology: Dg Chest Port 1 View  06/17/2015   CLINICAL DATA:  Coronary bypass.  EXAM: PORTABLE CHEST - 1 VIEW  COMPARISON:  06/16/2015.  FINDINGS: Interval removal of left chest tube. No pneumothorax. Right IJ sheath noted in stable position. Prior CABG. Cardiomegaly. No pulmonary venous congestion. Low lung volumes with bibasilar atelectasis and/or infiltrates. No acute bony abnormality.  IMPRESSION: 1. Interim removal of left chest tube. No pneumothorax. Right IJ sheath in stable position.  2. Prior CABG. Stable cardiomegaly. No pulmonary venous congestion.  3. Low lung volumes with mild bibasilar atelectasis and/or infiltrates.   Electronically Signed   By: Marcello Moores  Register   On:  06/17/2015 07:45     Assessment/Plan: S/P Procedure(s) (LRB): CORONARY ARTERY BYPASS GRAFTING times four using Left Internal mammary artery and right leg Saphenous vein graft (N/A) TRANSESOPHAGEAL ECHOCARDIOGRAM (TEE) (N/A)  CV- Maintaining SR. Still on low dose Neo and SBP 90-100.  Wean gtt as able. May need to d/c Lopressor until BPs improved and off Neo.  Expected postop blood loss anemia- H/H relatively stable. Continue to monitor.  Thrombocytopenia- plts trending up. Will watch.  DM- CBGs stable on low dose Levemir. Resume Metformin once po intake improved. A1C=7.7  Pulm toilet/IS, PT/ambulation.  Possibly ready for tx to stepdown later today if off gtts and remains stable.   COLLINS,GINA H 06/17/2015 8:20 AM  Patient seen and examined, agree with above Off neo at present, if he can stay off neo, transfer to [place] later today  [place] C. Roxan Hockey, MD [facility] [phone]

## 2015-06-17 NOTE — Care Management Note (Signed)
Case Management Note  Patient Details  Name: Hunter Sparks MRN: [id number] Date of Birth: May 11, 1946  Subjective/Objective:      Patient slow to progress, remains on neo.  PT ordered, may need rehab prior to discharge.  SW consult placed.               Action/Plan:   Expected Discharge Date:                  Expected Discharge Plan:  Skilled Nursing Facility  In-House Referral:  Clinical Social Work  Discharge planning Services  CM Consult  Post Acute Care Choice:    Choice offered to:     DME Arranged:    DME Agency:     HH Arranged:    [place] Agency:     Status of Service:  In process, will continue to follow  Medicare Important Message Given:  Yes-second notification given Date Medicare IM Given:    Medicare IM give by:    Date Additional Medicare IM Given:    Additional Medicare Important Message give by:     If discussed at [place] of Stay Meetings, dates discussed:    Additional Comments:  Vergie Living, RN 06/17/2015, 9:19 AM

## 2015-06-17 NOTE — Care Management Important Message (Signed)
Important Message  Patient Details  Name: Hunter Sparks MRN: [id number] Date of Birth: [birth year]   Medicare Important Message Given:  Yes-third notification given    Nathen May 06/17/2015, 11:57 AMImportant Message  Patient Details  Name: Hunter Sparks MRN: [id number] Date of Birth: 05-24-1946   Medicare Important Message Given:  Yes-third notification given    Nathen May 06/17/2015, 11:57 AM

## 2015-06-18 ENCOUNTER — Inpatient Hospital Stay (HOSPITAL_COMMUNITY): Payer: Medicare Other

## 2015-06-18 LAB — BASIC METABOLIC PANEL
ANION GAP: 8 (ref 5–15)
BUN: 19 mg/dL (ref 6–20)
CHLORIDE: 99 mmol/L — AB (ref 101–111)
CO2: 31 mmol/L (ref 22–32)
Calcium: 8.5 mg/dL — ABNORMAL LOW (ref 8.9–10.3)
Creatinine, Ser: 1.33 mg/dL — ABNORMAL HIGH (ref 0.61–1.24)
GFR calc Af Amer: >60 mL/min (ref >60)
GFR calc non Af Amer: 53 mL/min — ABNORMAL LOW (ref >60)
Glucose, Bld: 142 mg/dL — ABNORMAL HIGH (ref 65–99)
Potassium: 3.8 mmol/L (ref 3.5–5.1)
SODIUM: 138 mmol/L (ref 135–145)

## 2015-06-18 LAB — CBC
HCT: 24.2 % — ABNORMAL LOW (ref 39.0–52.0)
Hemoglobin: 8.1 g/dL — ABNORMAL LOW (ref 13.0–17.0)
MCH: 30.2 pg (ref 26.0–34.0)
MCHC: 33.5 g/dL (ref 30.0–36.0)
MCV: 90.3 fL (ref 78.0–100.0)
Platelets: 147 10*3/uL — ABNORMAL LOW (ref 150–400)
RBC: 2.68 MIL/uL — ABNORMAL LOW (ref 4.22–5.81)
RDW: 12.9 % (ref 11.5–15.5)
WBC: 5.7 10*3/uL (ref 4.0–10.5)

## 2015-06-18 LAB — GLUCOSE, CAPILLARY
GLUCOSE-CAPILLARY: 148 mg/dL — AB (ref 65–99)
GLUCOSE-CAPILLARY: 113 mg/dL — AB (ref 65–99)
Glucose-Capillary: 115 mg/dL — ABNORMAL HIGH (ref 65–99)
Glucose-Capillary: 84 mg/dL (ref 65–99)

## 2015-06-18 MED ORDER — LACTULOSE 10 GM/15ML PO SOLN
20.0000 g | Freq: Once | ORAL | Status: DC
  Filled 2015-06-18: qty 30

## 2015-06-18 MED ORDER — METOPROLOL TARTRATE 25 MG PO TABS
25.0000 mg | ORAL_TABLET | Freq: Two times a day (BID) | ORAL | Status: DC
  Administered 2015-06-18 (×2): 25 mg via ORAL
  Filled 2015-06-18 (×4): qty 1

## 2015-06-18 MED ORDER — FUROSEMIDE 40 MG PO TABS
40.0000 mg | ORAL_TABLET | Freq: Every day | ORAL | Status: DC
  Filled 2015-06-18: qty 1

## 2015-06-18 NOTE — Progress Notes (Signed)
CARDIAC REHAB PHASE I   PRE:  Rate/Rhythm: 100 RA  BP:  Sitting: 142/70        SaO2: 94 RA  MODE:  Ambulation: 350 ft   POST:  Rate/Rhythm: 123 ST, 105 after 3 minutes rest  BP:  Sitting: 141/68         SaO2: 99 RA  Pt lying in bed, needed reminder of sternal precautions, minimal assistance to stand. Pt ambulated 350 ft on RA, rolling walker, standby assist, slow, steady gait, tolerated well. Pt denies CP, dizziness, DOE, declined rest stop. Pt could have ambulated farther but wanted to return as he was expecting a visit from his granddaughters. Pt to recliner after walk, feet elevated, call bell within reach, wife at bedside. Encouraged IS, pt demonstrated use, encouraged ambulation. Pt verbalized understanding. Discussed phase 2 cardiac rehab, pt agrees to phase 2 CR. Will send referral to [place]. Will follow.   [phone]  Lenna Sciara, RN, BSN 06/18/2015 11:44 AM

## 2015-06-18 NOTE — Progress Notes (Signed)
Nutrition Brief Note  Patient identified on the Malnutrition Screening Tool (MST) Report.  Wt Readings from Last 15 Encounters:  06/18/15 182 lb (82.555 kg)  04/19/15 187 lb (84.823 kg)  02/12/15 182 lb (82.555 kg)  12/12/12 195 lb (88.451 kg)  06/21/11 185 lb (83.915 kg)    Body mass index is 24.68 kg/(m^2). Patient meets criteria for Normal based on current BMI.   Current diet order is Carbohydrate Modified, patient's average consumption is 80% of meals at this time. Labs and medications reviewed.   No nutrition interventions warranted at this time. If nutrition issues arise, please consult RD.   Arthur Holms, RD, LDN Pager #: [phone] After-Hours Pager #: [phone]

## 2015-06-18 NOTE — Progress Notes (Addendum)
      [address]       [place] [address]             [phone]        4 Days Post-Op Procedure(s) (LRB): CORONARY ARTERY BYPASS GRAFTING times four using Left Internal mammary artery and right leg Saphenous vein graft (N/A) TRANSESOPHAGEAL ECHOCARDIOGRAM (TEE) (N/A)  Subjective: Patient restless last evening. He is passing flatus and no bowel movement yet. He hopes to go home soon.  Objective: Vital signs in last 24 hours: Temp:  [97.8 F (36.6 C)-98.6 F (37 C)] 98 F (36.7 C) (08/05 0522) Pulse Rate:  [95-107] 98 (08/05 0522) Cardiac Rhythm:  [-] Normal sinus rhythm;Sinus tachycardia (08/05 0806) Resp:  [14-18] 16 (08/05 0522) BP: (98-139)/(51-79) 123/60 mmHg (08/05 0522) SpO2:  [93 %-98 %] 96 % (08/05 0522) Weight:  [182 lb (82.555 kg)] 182 lb (82.555 kg) (08/05 0522)  Pre op weight 81 kg Current Weight  06/18/15 182 lb (82.555 kg)      Intake/Output from previous day: 08/04 0701 - 08/05 0700 In: 820 [P.O.:540; I.V.:130; IV Piggyback:150] Out: 200 [Urine:200]   Physical Exam:  Cardiovascular: RRR, no murmurs, gallops, or rubs. Pulmonary: Slightly diminished at bases bilaterally; no rales, wheezes, or rhonchi. Abdomen: Soft, non tender, bowel sounds present. Extremities: Mild bilateral lower extremity edema. Wounds: Clean and dry.  No erythema or signs of infection.  Lab Results: CBC: Recent Labs  06/17/15 0544 06/18/15 0443  WBC 8.0 5.7  HGB 8.1* 8.1*  HCT 24.1* 24.2*  PLT 101* 147*   BMET:  Recent Labs  06/17/15 0544 06/18/15 0443  NA 137 138  K 3.7 3.8  CL 100* 99*  CO2 31 31  GLUCOSE 113* 142*  BUN 19 19  CREATININE 1.12 1.33*  CALCIUM 8.3* 8.5*    PT/INR:  Lab Results  Component Value Date   INR 1.50* 06/14/2015   INR 1.01 06/11/2015   ABG:  INR: Will add last result for INR, ABG once components are confirmed Will add last 4 CBG results once components are confirmed  Assessment/Plan:  1. CV - ST in  the 100's . On Lopressor 12.5 mg bid. Will increase Lopressor to 25 mg bid. Will not start ACE or ARB as creatinine elevated. 2.  Pulmonary - On room air. CXR this am shows no pneumothorax, bibasilar atelectasis, small pleural effusions. Encourage incentive spirometer. 3. Volume Overload - On Lasix 40 mg daily 4.  Acute blood loss anemia - H and H stable at 8.1 and 24.2 5. DM-CBGs 137/119/115. On Metformin pre op. Continue Insulin for today and will not restart Metformin yet as creatinine elevated. 6. Mild thrombocytopenia-platelets up to 147,000 7. Supplement potassium 8. Creatinine slightly increased from 1.12 to 1.33. Hold Lasix today and recheck creatinine in am. 9. Remove EPW in am 10. LOC constipation 11.GI-advance diet to carb modified medium caloric diet 12. Possibly home this weekend  ZIMMERMAN,DONIELLE MPA-C 06/18/2015,8:53 AM   Chart reviewed, patient examined, agree with above.

## 2015-06-18 NOTE — Discharge Instructions (Signed)
Activity: 1.May walk up steps °               2.No lifting more than ten pounds for four weeks.  °               3.No driving for four weeks. °               4.Stop any activity that causes chest pain, shortness of breath, dizziness, sweating or excessive weakness. °               5.Avoid straining. °               6.Continue with your breathing exercises daily. ° °Diet: Diabetic diet and Low fat, Low salt diet ° °Wound Care: May shower.  Clean wounds with mild soap and water daily. Contact the office at [phone] if any problems arise. ° °Coronary Artery Bypass Grafting, Care After °Refer to this sheet in the next few weeks. These instructions provide you with information on caring for yourself after your procedure. Your health care provider may also give you more specific instructions. Your treatment has been planned according to current medical practices, but problems sometimes occur. Call your health care provider if you have any problems or questions after your procedure. °WHAT TO EXPECT AFTER THE PROCEDURE °Recovery from surgery will be different for everyone. Some people feel well after 3 or 4 weeks, while for others it takes longer. After your procedure, it is typical to have the following: °· Nausea and a lack of appetite.   °· Constipation. °· Weakness and fatigue.   °· Depression or irritability.   °· Pain or discomfort at your incision site. °HOME CARE INSTRUCTIONS °· Take medicines only as directed by your health care provider. Do not stop taking medicines or start any new medicines without first checking with your health care provider. °· Take your pulse as directed by your health care provider. °· Perform deep breathing as directed by your health care provider. If you were given a device called an incentive spirometer, use it to practice deep breathing several times a day. Support your chest with a pillow or your arms when you take deep breaths or cough. °· Keep incision areas clean, dry, and  protected. Remove or change any bandages (dressings) only as directed by your health care provider. You may have skin adhesive strips over the incision areas. Do not take the strips off. They will fall off on their own. °· Check incision areas daily for any swelling, redness, or drainage. °· If incisions were made in your legs, do the following: °¨ Avoid crossing your legs.   °¨ Avoid sitting for long periods of time. Change positions every 30 minutes.   °¨ Elevate your legs when you are sitting. °· Wear compression stockings as directed by your health care provider. These stockings help keep blood clots from forming in your legs. °· Take showers once your health care provider approves. Until then, only take sponge baths. Pat incisions dry. Do not rub incisions with a washcloth or towel. Do not take baths, swim, or use a hot tub until your health care provider approves. °· Eat foods that are high in fiber, such as raw fruits and vegetables, whole grains, beans, and nuts. Meats should be lean cut. Avoid canned, processed, and fried foods. °· Drink enough fluid to keep your urine clear or pale yellow. °· Weigh yourself every day. This helps identify if you are retaining fluid that may make your heart and lungs   work harder. °· Rest and limit activity as directed by your health care provider. You may be instructed to: °¨ Stop any activity at once if you have chest pain, shortness of breath, irregular heartbeats, or dizziness. Get help right away if you have any of these symptoms. °¨ Move around frequently for short periods or take short walks as directed by your health care provider. Increase your activities gradually. You may need physical therapy or cardiac rehabilitation to help strengthen your muscles and build your endurance. °¨ Avoid lifting, pushing, or pulling anything heavier than 10 lb (4.5 kg) for at least 6 weeks after surgery. °· Do not drive until your health care provider approves.  °· Ask your health  care provider when you may return to work. °· Ask your health care provider when you may resume sexual activity. °· Keep all follow-up visits as directed by your health care provider. This is important. °SEEK MEDICAL CARE IF: °· You have swelling, redness, increasing pain, or drainage at the site of an incision. °· You have a fever. °· You have swelling in your ankles or legs. °· You have pain in your legs.   °· You gain 2 or more pounds (0.9 kg) a day. °· You are nauseous or vomit. °· You have diarrhea.  °SEEK IMMEDIATE MEDICAL CARE IF: °· You have chest pain that goes to your jaw or arms. °· You have shortness of breath.   °· You have a fast or irregular heartbeat.   °· You notice a "clicking" in your breastbone (sternum) when you move.   °· You have numbness or weakness in your arms or legs. °· You feel dizzy or light-headed.   °MAKE SURE YOU: °· Understand these instructions. °· Will watch your condition. °· Will get help right away if you are not doing well or get worse. °Document Released: 05/19/2005 Document Revised: 03/16/2014 Document Reviewed: 04/08/2013 °Exit[employer]® Patient Information ©2015 [employer]. This information is not intended to replace advice given to you by your health care provider. Make sure you discuss any questions you have with your health care provider. ° ° ° °

## 2015-06-18 NOTE — Anesthesia Postprocedure Evaluation (Signed)
  Anesthesia Post-op Note  Patient: Hunter Sparks  Procedure(s) Performed: Procedure(s): CORONARY ARTERY BYPASS GRAFTING times four using Left Internal mammary artery and right leg Saphenous vein graft (N/A) TRANSESOPHAGEAL ECHOCARDIOGRAM (TEE) (N/A)  Patient Location: ICU  Anesthesia Type:General  Level of Consciousness: sedated  Airway and Oxygen Therapy: Patient remains intubated per anesthesia plan  Post-op Pain: none  Post-op Assessment: Post-op Vital signs reviewed, Patient's Cardiovascular Status Stable and Respiratory Function Stable              Post-op Vital Signs: Reviewed and stable  Last Vitals:  Filed Vitals:   06/18/15 0522  BP: 123/60  Pulse: 98  Temp: 36.7 C  Resp: 16    Complications: No apparent anesthesia complications

## 2015-06-18 NOTE — Care Management Note (Signed)
Case Management Note  Patient Details  Name: Hunter Sparks MRN: [id number] Date of Birth: 26-Nov-1945  Subjective/Objective:    Pt admitted with angina                Action/Plan:  Pt is independent from home with wife.  CM will monitor for disposition needs   Expected Discharge Date:                  Expected Discharge Plan:  [place]  In-House Referral:  Clinical Social Work  Discharge planning Services  CM Consult  Post Acute Care Choice:    Choice offered to:     DME Arranged:    DME Agency:     HH Arranged:    [place] Agency:     Status of Service:  In process, will continue to follow  Medicare Important Message Given:  Yes-third notification given Date Medicare IM Given:    Medicare IM give by:    Date Additional Medicare IM Given:    Additional Medicare Important Message give by:     If discussed at [place] of Stay Meetings, dates discussed:    Additional Comments: CM assessed pt. PT has written dme recommendations - dme will be arranged once order is written, CM placed physician sticky note requesting dme order.  Maryclare Labrador, RN 06/18/2015, 2:21 PM

## 2015-06-18 NOTE — Progress Notes (Signed)
Physical Therapy Treatment Patient Details Name: Hunter Sparks MRN: [id number] DOB: 01-24-1946 Today's Date: 06/18/2015    History of Present Illness Pt is a 69 y/o male who presents s/p CABG x4 on 06/14/15.    PT Comments    Pt progressing towards physical therapy goals. Was able to ambulate >600' in the hall with RW for support. Occasional unsteadiness noted, however recommended that pt have wife with him and use RW at home until he is back to baseline. Discussed the benefits of cardiac rehab and pt was agreeable. Will continue to follow and progress as able per POC.   Follow Up Recommendations  Supervision for mobility/OOB;Other (comment) (Cardiac rehab)     Equipment Recommendations  Rolling walker with 5" wheels    Recommendations for Other Services       Precautions / Restrictions Precautions Precautions: Fall;Sternal Precaution Comments: Reviewed sternal precautions. Pt unable to recall without cueing. Restrictions Weight Bearing Restrictions: Yes (Sternal precautions)    Mobility  Bed Mobility Overal bed mobility: Needs Assistance Bed Mobility: Supine to Sit;Sit to Sidelying;Rolling Rolling: Supervision   Supine to sit: Mod assist   Sit to sidelying: Min assist General bed mobility comments: Attempted to teach log roll technique for ease of sitting up without assistance. Pt states he would rather just sit staright up, however he required mod assist to elevate trunk to full sitting position. Assist to elevate LE's back into bed was required at end of session.   Transfers Overall transfer level: Needs assistance Equipment used: Rolling walker (2 wheeled) Transfers: Sit to/from Stand Sit to Stand: Min guard         General transfer comment: Reviewed use of heart pillow and sternal precautions prior to stand as pt was reaching for walker to push himself up. Bed was raised to simulate home environment. Pt did not require any assistance to power-up to full stand  however close guard was provided for safety.   Ambulation/Gait Ambulation/Gait assistance: Min guard;Supervision Ambulation Distance (Feet): 625 Feet Assistive device: Rolling walker (2 wheeled) Gait Pattern/deviations: Step-through pattern;Decreased stride length;Decreased dorsiflexion - right;Decreased step length - right;Trunk flexed Gait velocity: Decreased Gait velocity interpretation: Below normal speed for age/gender General Gait Details: Grossly at a supervision level, however occasional min guard provided for intermittent unsteadiness. Pt recovered independently, however close guard provided for safety.    Stairs            Wheelchair Mobility    Modified Rankin (Stroke Patients Only)       Balance Overall balance assessment: Needs assistance Sitting-balance support: Feet supported;No upper extremity supported Sitting balance-Leahy Scale: Fair     Standing balance support: No upper extremity supported Standing balance-Leahy Scale: Fair                      Cognition Arousal/Alertness: Awake/alert Behavior During Therapy: Flat affect Overall Cognitive Status: Within Functional Limits for tasks assessed       Memory: Decreased recall of precautions              Exercises      General Comments        Pertinent Vitals/Pain Pain Assessment: No/denies pain    Home Living                      Prior Function            PT Goals (current goals can now be found in the care plan section) Acute Rehab  PT Goals Patient Stated Goal: None stated PT Goal Formulation: With patient/family Time For Goal Achievement: 06/24/15 Potential to Achieve Goals: Good Progress towards PT goals: Progressing toward goals    Frequency  Min 3X/week    PT Plan Current plan remains appropriate    Co-evaluation             End of Session Equipment Utilized During Treatment: Gait belt Activity Tolerance: Patient tolerated treatment  well Patient left: in bed;with call bell/phone within reach;with family/visitor present     Time: 0912-0933 PT Time Calculation (min) (ACUTE ONLY): 21 min  Charges:  $Gait Training: 8-22 mins                    G Codes:      Rolinda Roan 07/13/15, 9:50 AM  Rolinda Roan, PT, DPT Acute Rehabilitation Services Pager: [phone]

## 2015-06-18 NOTE — Discharge Summary (Signed)
Physician Discharge Summary       [address]       [place] [address]             [phone]    Patient ID: Hunter Sparks MRN: [id number] DOB/AGE: 71-Feb-1947 71 y.o.  Admit date: 06/10/2015 Discharge date: 06/19/2015  Admission Diagnoses: 1. Multivessel CAD 2. History of DM 3. History of hypertension 4. History of hyperlipidemia 5. History of tobacco abuse 6. History of hypothyroidism 7. History of chronic low back pain 8. History of DJD  Discharge Diagnoses:  1. Multivessel CAD 2. History of DM 3. History of hypertension 4. History of hyperlipidemia 5. History of tobacco abuse 6. History of hypothyroidism 7. History of chronic low back pain 8. History of DJD 9. ABL anemia  Procedure (s):  Cardiac catheterization done by Dr. Tamala Julian on 06/11/2015: 1. Prox LAD to Mid LAD lesion, 95% stenosed. 2. 2nd Diag lesion, 70% stenosed. 3. 3rd Diag lesion, 90% stenosed. 4. Mid Cx lesion, 99% stenosed. 5. Dist Cx lesion, 40% stenosed. 6. Dist RCA lesion, 90% stenosed. 7. The left ventricular systolic function is normal.   Severe three-vessel coronary disease involving the proximal LAD, mid circumflex, and distal RCA in a diabetic.  Normal left ventricular function  Clinical presentation with unstable angina Coronary artery bypass grafting x4 (left internal mammary artery to left anterior descending, saphenous vein graft to first diagonal, saphenous vein graft to OM-2, saphenous vein graft to posterior descending). Endoscopic harvest of right leg greater saphenous vein by Dr. Prescott Gum on 06/14/2015.  History of Presenting Illness: This is a 69 year old Caucasian diabetic ex--smoker 71-Feb-1947 presents with symptoms of unstable angina and negative cardiac enzymes. He required IV nitroglycerin to control his chest pain.  Cardiac catheterization was performed via right radial artery and there is no evidence of hematoma or postprocedural bleeding.Cardiac catheterization  performed by Dr. Tamala Julian demonstrates severe multivessel coronary disease with preserved LV systolic function, LVEDP 8 mmHg, and no evidence of aortic stenosis or mitral regurgitation. Echocardiogram showed LVEF to be 60-65%, no AS or AI, trivial MR, and mild TR, and no wall motion abnormalities. The patient is currently pain-free on IV nitroglycerin. Patient's risk factors include history smoking, diabetes, hyperlipidemia, and hypertension. A cardiothoracic consultation was obtained with Dr. Prescott Gum for the consideration of coronary artery bypass grafting surgery. Potential risks, benefits, and complications were discussed with the patient and he agreed to proceed with surgery. Pre operative carotid duplex showed no significant carotid artery stenosis bilaterally. He underwent a CABG x 4 on 06/14/2015.  Brief Hospital Course:  The patient was extubated late the evening of surgery without difficulty. He remained afebrile and hemodynamically stable. He was weaned off Neo synephrine drip. Gordy Councilman, a line, chest tubes, and foley were removed early in the post operative course. Lopressor was started and titrated accordingly. He was volume over loaded and diuresed. He/she had ABL anemia. He did not require a post op transfusion. His last H and H was 8.1 and 24.2. He had mild thrombocytopenia. His last platelet count was up to 147,000. He was weaned off the insulin drip. He was not restarted on Metformin because his creatinine was elevated post op.T he patient's glucose remained well controlled. He will be restarted on Metformin at discharge, provided creatinine normalizes.  The patient was felt surgically stable for transfer from the ICU to [facility] for further convalescence on 06/17/2015. He continues to progress with cardiac rehab. He was ambulating on room air. He has  been tolerating a diet and has had a bowel movement. Epicardial pacing wires and chest tube sutures will be removed prior to discharge. The patient  is felt surgically stable for discharge today.   Latest Vital Signs: Blood pressure 156/76, pulse 112, temperature 99.1 F (37.3 C), temperature source Oral, resp. rate 16, height 6' (1.829 m), weight 176 lb 6.4 oz (80.015 kg), SpO2 99 %.  Physical Exam: Cardiovascular: RRR, no murmurs, gallops, or rubs. Pulmonary: Slightly diminished at bases bilaterally; no rales, wheezes, or rhonchi. Abdomen: Soft, non tender, bowel sounds present. Extremities: Mild bilateral lower extremity edema. Wounds: Clean and dry. No erythema or signs of infection.  Discharge Condition:Stable and discharged to home  Recent laboratory studies:  Lab Results  Component Value Date   WBC 5.7 06/18/2015   HGB 8.1* 06/18/2015   HCT 24.2* 06/18/2015   MCV 90.3 06/18/2015   PLT 147* 06/18/2015   Lab Results  Component Value Date   NA 138 06/19/2015   K 4.5 06/19/2015   CL 100* 06/19/2015   CO2 27 06/19/2015   CREATININE 1.33* 06/19/2015   GLUCOSE 140* 06/19/2015    Diagnostic Studies: Dg Chest 2 View  06/18/2015   CLINICAL DATA:  CABG.  EXAM: CHEST  2 VIEW  COMPARISON:  None.  FINDINGS: Interval removal of right IJ sheath. Mediastinum and hilar structures normal. Prior CABG. Heart size normal. Persistent but partially clearing bibasilar subsegmental atelectasis and/or infiltrates. No prominent pleural effusion or pneumothorax.  IMPRESSION: 1. Interim removal of right IJ sheath. 2. Prior CABG.  Heart size normal. 3. Persistent but partially clearing bibasilar subsegmental atelectasis and/or infiltrates.   Electronically Signed   By: Marcello Moores  Register   On: 06/18/2015 07:49   Dg Facet Jt Inj C/t Single Level Left W/fl/ct  05/19/2015   CLINICAL DATA:  Chronic lumbago/low back pain. Mid back pain radiating to the anterior and posterior leg on the LEFT. 75% relief with last facet injection for 3 months.  EXAM: LUMBAR FACET INJECTION, LEFT L4-L5 and LEFT L5-S1  FLUOROSCOPY TIME:  0 minutes 59 seconds  Dose: 0.353 Gycm2   COMPARISON:  01/29/2015.  PROCEDURE: Consent was obtained after a discussion of risks and benefits of the procedure. General risks included bleeding, infection, injury to nerves, blood vessels, and adjacent structures. Specific risks included nondiagnostic and nontherapeutic injection, non target injection, worsening of back pain.  The skin was prepped and draped in the usual sterile fashion. Under fluoroscopic guidance a curved 22 gauge 3 1/2-inch spinal needle was advanced into the facet joint at L4-L5 and L5-S1 on the LEFT. Injection of 0.50 ml of Omnipaque 180 was performed which showed intra-articular needle placement. No vascular uptake was observed. Subsequently, a small amount of 0.25% sensorcaine, estimated at 0.5 ml with 60 mg of Depo-Medrol was injected into each facet joint. Concordant LEFT lower extremity radiculopathy was experienced with LEFT facet joint injection.  The patient tolerated the procedure well and there were no complications. The patient was observed in the holding room for 30 minutes prior to discharge.  IMPRESSION: Technically successful LEFT L4-L5 and LEFT L5-S1 facet injections with steroid and anesthetic. The patient could be considered for repeat facet injections in 2-3 months if 50% pain relief from today's injections for 6 weeks.   Electronically Signed   By: Dereck Ligas M.D.   On: 05/19/2015 11:36      Discharge Instructions    Amb Referral to Cardiac Rehabilitation    Complete by:  As directed  Congestive Heart Failure: If diagnosis is Heart Failure, patient MUST meet each of the CMS criteria: 1. Left Ventricular Ejection Fraction </= 35% 2. NYHA class II-IV symptoms despite being on optimal heart failure therapy for at least 6 weeks. 3. Stable = have not had a recent (<6 weeks) or planned (<6 months) major cardiovascular hospitalization or procedure  Program Details: - Physician supervised classes - 1-3 classes per week over a 12-18 week period, generally for  a total of 36 sessions  Physician Certification: I certify that the above Cardiac Rehabilitation treatment is medically necessary and is medically approved by me for treatment of this patient. The patient is willing and cooperative, able to ambulate and medically stable to participate in exercise rehabilitation. The participant's progress and Individualized Treatment Plan will be reviewed by the Medical Director, Cardiac Rehab staff and as indicated by the Referring/Ordering Physician.  Diagnosis:  CABG          Discharge Medications:    Medication List    STOP taking these medications        HYDROmorphone 4 MG tablet  Commonly known as:  DILAUDID     lisinopril 2.5 MG tablet  Commonly known as:  PRINIVIL,ZESTRIL     naproxen sodium 220 MG tablet  Commonly known as:  ALEVE     NITROSTAT 0.4 MG SL tablet  Generic drug:  nitroGLYCERIN      TAKE these medications        amLODipine 5 MG tablet  Commonly known as:  NORVASC  Take 1 tablet (5 mg total) by mouth daily.     aspirin 325 MG EC tablet  Take 1 tablet (325 mg total) by mouth daily.     atorvastatin 40 MG tablet  Commonly known as:  LIPITOR  Take 1 tablet (40 mg total) by mouth daily at 6 PM.     furosemide 40 MG tablet  Commonly known as:  LASIX  Take 1 tablet (40 mg total) by mouth daily. For 5 Days     levothyroxine 75 MCG tablet  Commonly known as:  SYNTHROID, LEVOTHROID  Take 75 mcg by mouth daily.     metFORMIN 500 MG tablet  Commonly known as:  GLUCOPHAGE  Take 500 mg by mouth 2 (two) times daily with a meal.     metoprolol tartrate 25 MG tablet  Commonly known as:  LOPRESSOR  Take 1 tablet (25 mg total) by mouth 2 (two) times daily.     potassium chloride SA 20 MEQ tablet  Commonly known as:  K-DUR,KLOR-CON  Take 1 tablet (20 mEq total) by mouth daily. For 5 Days     tamsulosin 0.4 MG Caps capsule  Commonly known as:  FLOMAX  Take 1 capsule daily until stone passes.     traMADol 50 MG tablet   Commonly known as:  ULTRAM  Take 1 tablet (50 mg total) by mouth every 6 (six) hours as needed. pain       The patient has been discharged on:   1.Beta Blocker:  Yes [x   ]                              No   [   ]                              If No, reason:  2.Ace Inhibitor/ARB: Yes [   ]  No  [  x  ]                                     If No, reason:Labiel BP and elevated creatinine  3.Statin:   Yes [ x  ]                  No  [   ]                  If No, reason:  4.Ecasa:  Yes  [ x  ]                  No   [   ]                  If No, reason:  Follow Up Appointments: Follow-up Information    Follow up with Jettie Booze., MD.   Specialties:  Cardiology, Radiology, Interventional Cardiology   Why:  Please contact office for appointment date and time   Contact information:   [address] [place] [address] [phone]       Follow up with Tharon Aquas Trigt III, MD On 07/21/2015.   Specialty:  Cardiothoracic Surgery   Why:  PA/LAT CXR to be taken (at [facility] which is in the same building as Dr. Lucianne Lei Trigt's office) on 07/21/2015 at 2:15 pm;Appointment time is at  3:00 pm   Contact information:   [place] [address] [phone]       Follow up with Alonza Bogus, MD.   Specialty:  Pulmonary Disease   Why:  Call for a follow up appointment regarding further diabetes management and surveillance of HGA1C   Contact information:   [place] [address] [phone]       Follow up with [facility].   Why:  physical and occupational therapy   Contact information:   [address] [place] [address] [phone]       Follow up with [facility].   Why:  rolling walker   Contact information:   [place] [address] [phone]       Signed: Cinda Quest 06/19/2015, 11:50 AM

## 2015-06-19 LAB — BASIC METABOLIC PANEL
Anion gap: 11 (ref 5–15)
BUN: 19 mg/dL (ref 6–20)
CHLORIDE: 100 mmol/L — AB (ref 101–111)
CO2: 27 mmol/L (ref 22–32)
Calcium: 9.2 mg/dL (ref 8.9–10.3)
Creatinine, Ser: 1.33 mg/dL — ABNORMAL HIGH (ref 0.61–1.24)
GFR calc non Af Amer: 53 mL/min — ABNORMAL LOW (ref >60)
Glucose, Bld: 140 mg/dL — ABNORMAL HIGH (ref 65–99)
Potassium: 4.5 mmol/L (ref 3.5–5.1)
Sodium: 138 mmol/L (ref 135–145)

## 2015-06-19 LAB — GLUCOSE, CAPILLARY: Glucose-Capillary: 149 mg/dL — ABNORMAL HIGH (ref 65–99)

## 2015-06-19 MED ORDER — AMLODIPINE BESYLATE 5 MG PO TABS
5.0000 mg | ORAL_TABLET | Freq: Every day | ORAL | Status: DC
  Filled 2015-06-19: qty 1

## 2015-06-19 MED ORDER — TRAMADOL HCL 50 MG PO TABS: 50.0000 mg | ORAL_TABLET | Freq: Four times a day (QID) | ORAL | Status: DC | PRN

## 2015-06-19 MED ORDER — HYDRALAZINE HCL 20 MG/ML IJ SOLN: 10.0000 mg | Freq: Four times a day (QID) | INTRAMUSCULAR | Status: DC | PRN

## 2015-06-19 MED ORDER — ATORVASTATIN CALCIUM 40 MG PO TABS: 40.0000 mg | ORAL_TABLET | Freq: Every day | ORAL | Status: DC

## 2015-06-19 MED ORDER — ASPIRIN 325 MG PO TBEC: 325.0000 mg | DELAYED_RELEASE_TABLET | Freq: Every day | ORAL | Status: DC

## 2015-06-19 MED ORDER — METOPROLOL TARTRATE 25 MG PO TABS: 25.0000 mg | ORAL_TABLET | Freq: Two times a day (BID) | ORAL | Status: DC

## 2015-06-19 MED ORDER — AMLODIPINE BESYLATE 5 MG PO TABS: 5.0000 mg | ORAL_TABLET | Freq: Every day | ORAL | Status: DC

## 2015-06-19 MED ORDER — FUROSEMIDE 40 MG PO TABS: 40.0000 mg | ORAL_TABLET | Freq: Every day | ORAL | Status: DC

## 2015-06-19 MED ORDER — POTASSIUM CHLORIDE CRYS ER 20 MEQ PO TBCR: 20.0000 meq | EXTENDED_RELEASE_TABLET | Freq: Every day | ORAL | Status: DC

## 2015-06-19 NOTE — Progress Notes (Signed)
      [address]       [place] [address]             [phone]      5 Days Post-Op Procedure(s) (LRB): CORONARY ARTERY BYPASS GRAFTING times four using Left Internal mammary artery and right leg Saphenous vein graft (N/A) TRANSESOPHAGEAL ECHOCARDIOGRAM (TEE) (N/A)   Subjective:  Hunter Sparks has no complaints this morning.  States he is ready to go home today.  Objective: Vital signs in last 24 hours: Temp:  [98 F (36.7 C)-99.1 F (37.3 C)] 99.1 F (37.3 C) (08/06 0512) Pulse Rate:  [94-112] 112 (08/06 0512) Cardiac Rhythm:  [-] Normal sinus rhythm (08/05 2206) Resp:  [16-18] 16 (08/06 0512) BP: (125-156)/(61-76) 156/76 mmHg (08/06 0512) SpO2:  [97 %-99 %] 99 % (08/06 0512) Weight:  [176 lb 6.4 oz (80.015 kg)] 176 lb 6.4 oz (80.015 kg) (08/06 0512)  Intake/Output from previous day: 08/05 0701 - 08/06 0700 In: 540 [P.O.:540] Out: -   General appearance: alert, cooperative and no distress Heart: regular rate and rhythm Lungs: clear to auscultation bilaterally Abdomen: soft, non-tender; bowel sounds normal; no masses,  no organomegaly Extremities: edema trace Wound: clean and dry  Lab Results:  Recent Labs  06/17/15 0544 06/18/15 0443  WBC 8.0 5.7  HGB 8.1* 8.1*  HCT 24.1* 24.2*  PLT 101* 147*   BMET:  Recent Labs  06/18/15 0443 06/19/15 0400  NA 138 138  K 3.8 4.5  CL 99* 100*  CO2 31 27  GLUCOSE 142* 140*  BUN 19 19  CREATININE 1.33* 1.33*  CALCIUM 8.5* 9.2    PT/INR: No results for input(s): LABPROT, INR in the last 72 hours. ABG    Component Value Date/Time   PHART 7.377 06/15/2015 0559   HCO3 25.5* 06/15/2015 0559   TCO2 24 06/15/2015 1634   ACIDBASEDEF 2.0 06/14/2015 2359   O2SAT 96.0 06/15/2015 0559   CBG (last 3)   Recent Labs  06/18/15 1646 06/18/15 2113 06/19/15 0526  GLUCAP 84 113* 149*    Assessment/Plan: S/P Procedure(s) (LRB): CORONARY ARTERY BYPASS GRAFTING times four using Left Internal mammary  artery and right leg Saphenous vein graft (N/A) TRANSESOPHAGEAL ECHOCARDIOGRAM (TEE) (N/A)  1. CV- Sinus Tach- on Lopressor 25 mg BID, will continue to hold ACE/ARB 2. Pulm- on room air, continue IS 3. Renal- creatinine remains stable, on Lasix will taper over next several days 4. Acute anemia- stable at 8.1, patient asymptomatic 5. LOC constipation- resolved 6. Dispo- patient stable, will d/c home today   LOS: 9 days    Hunter Sparks 06/19/2015

## 2015-06-19 NOTE — Care Management Note (Signed)
Case Management Note  Patient Details  Name: Hunter Sparks MRN: [id number] Date of Birth: 09-18-1946  Subjective/Objective:                  Angina, class II  Action/Plan: Discharge planning  Expected Discharge Date:  06/19/15               Expected Discharge Plan:  [place]  In-House Referral:     Discharge planning Services  CM Consult  Post Acute Care Choice:  Home Health Choice offered to:  Patient, Spouse  DME Arranged:  Walker rolling DME Agency:     HH Arranged:  PT, OT HH Agency:  [facility]  Status of Service:  Completed, signed off  Medicare Important Message Given:  Yes-third notification given Date Medicare IM Given:    Medicare IM give by:    Date Additional Medicare IM Given:    Additional Medicare Important Message give by:     If discussed at [place] of Stay Meetings, dates discussed:    Additional Comments: CM met with pt in room to offer choice of home health agency.  Pt chooses [facility] to render HHPT/OT.  Address and contact information verified by pt.  Referral called to [facility] rep, Tiffany.  CM called [facility] DME rep, Jeneen Rinks to please deliver the rolling walker to room so pt can discharge.  No other CM needs were communicated. Dellie Catholic, RN 06/19/2015, 11:17 AM

## 2015-06-19 NOTE — Progress Notes (Signed)
[id number] Education completed with pt and wife who voiced understanding. Encouraged IS. Pt did not want to watch post op video as he wanted to go home. Gave diabetic and heart healthy diets and discussed healthy choices. Briefly discussed carb counting. Wife stated they have seen dietitian. Referring to  Phase 2. Graylon Good RN BSN 06/19/2015 11:11 AM

## 2015-06-19 NOTE — Progress Notes (Signed)
Discharged to home with family office visits in place teaching done  

## 2015-06-22 DIAGNOSIS — I25110 Atherosclerotic heart disease of native coronary artery with unstable angina pectoris: Secondary | ICD-10-CM | POA: Diagnosis not present

## 2015-06-22 DIAGNOSIS — E785 Hyperlipidemia, unspecified: Secondary | ICD-10-CM | POA: Diagnosis not present

## 2015-06-22 DIAGNOSIS — E119 Type 2 diabetes mellitus without complications: Secondary | ICD-10-CM | POA: Diagnosis not present

## 2015-06-22 DIAGNOSIS — Z48812 Encounter for surgical aftercare following surgery on the circulatory system: Secondary | ICD-10-CM | POA: Diagnosis not present

## 2015-06-22 DIAGNOSIS — E039 Hypothyroidism, unspecified: Secondary | ICD-10-CM | POA: Diagnosis not present

## 2015-06-22 DIAGNOSIS — I10 Essential (primary) hypertension: Secondary | ICD-10-CM | POA: Diagnosis not present

## 2015-06-22 DIAGNOSIS — Z951 Presence of aortocoronary bypass graft: Secondary | ICD-10-CM | POA: Diagnosis not present

## 2015-06-24 DIAGNOSIS — I10 Essential (primary) hypertension: Secondary | ICD-10-CM | POA: Diagnosis not present

## 2015-06-24 DIAGNOSIS — E039 Hypothyroidism, unspecified: Secondary | ICD-10-CM | POA: Diagnosis not present

## 2015-06-24 DIAGNOSIS — E785 Hyperlipidemia, unspecified: Secondary | ICD-10-CM | POA: Diagnosis not present

## 2015-06-24 DIAGNOSIS — I25110 Atherosclerotic heart disease of native coronary artery with unstable angina pectoris: Secondary | ICD-10-CM | POA: Diagnosis not present

## 2015-06-24 DIAGNOSIS — Z951 Presence of aortocoronary bypass graft: Secondary | ICD-10-CM | POA: Diagnosis not present

## 2015-06-24 DIAGNOSIS — Z48812 Encounter for surgical aftercare following surgery on the circulatory system: Secondary | ICD-10-CM | POA: Diagnosis not present

## 2015-06-24 DIAGNOSIS — E119 Type 2 diabetes mellitus without complications: Secondary | ICD-10-CM | POA: Diagnosis not present

## 2015-07-13 ENCOUNTER — Ambulatory Visit (INDEPENDENT_AMBULATORY_CARE_PROVIDER_SITE_OTHER): Payer: Medicare Other | Admitting: Physician Assistant

## 2015-07-13 ENCOUNTER — Encounter: Payer: Self-pay | Admitting: Physician Assistant

## 2015-07-13 VITALS — BP 110/60 | HR 69 | Ht 72.0 in | Wt 176.0 lb

## 2015-07-13 DIAGNOSIS — E785 Hyperlipidemia, unspecified: Secondary | ICD-10-CM

## 2015-07-13 DIAGNOSIS — I25110 Atherosclerotic heart disease of native coronary artery with unstable angina pectoris: Secondary | ICD-10-CM | POA: Diagnosis not present

## 2015-07-13 DIAGNOSIS — I10 Essential (primary) hypertension: Secondary | ICD-10-CM

## 2015-07-13 LAB — BASIC METABOLIC PANEL
BUN: 18 mg/dL (ref 6–23)
CO2: 32 meq/L (ref 19–32)
Calcium: 9.3 mg/dL (ref 8.4–10.5)
Chloride: 101 mEq/L (ref 96–112)
Creatinine, Ser: 0.92 mg/dL (ref 0.40–1.50)
GFR: 86.21 mL/min (ref >60.00)
Glucose, Bld: 148 mg/dL — ABNORMAL HIGH (ref 70–99)
Potassium: 4.5 mEq/L (ref 3.5–5.1)
SODIUM: 138 meq/L (ref 135–145)

## 2015-07-13 NOTE — Assessment & Plan Note (Addendum)
Has not tolerated statins in the past. Discussed with Dr. Harl Bowie at next visit.

## 2015-07-13 NOTE — Assessment & Plan Note (Signed)
Blood pressure running on the low side. Will stop amlodipine. His wife will check his blood pressures daily and call us if there are elevated. He is been on lisinopril in the past but this was stopped due to renal insufficiency post-CABG. We'll repeat   labs today.

## 2015-07-13 NOTE — Assessment & Plan Note (Signed)
Patient doing well status post CABG. Recommend cardiac rehabilitation. He has not tolerated statins in the past. Follow-up with Dr. Harl Bowie to discuss further recommendations.

## 2015-07-13 NOTE — Progress Notes (Signed)
Cardiology Office Note   Date:  07/13/2015   ID:  Hunter Sparks, DOB [birth year], MRN [id number]  PCP:  Alonza Bogus, MD  Cardiologist: Dr. Harl Bowie i Chief Complaint:    History of Present Illness: Hunter Sparks is a 69 y.o. male who presents for hospital follow-up. He was admitted with unstable angina and underwent cardiac catheterization which demonstrated severe multivessel CAD with preserved LV systolic function. Echo showed EF to be 60-65%. He underwent CABG 4 on 06/14/15 with a LIMA to the LAD, SVG to the diagonal 1, SVG to the OM 2, and SVG to the PDA. He had some postop renal insufficiency with creatinine of 1.33 at discharge. He used to take lisinopril for hypertension but this was stopped.  Patient comes in today accompanied by his wife who used to work at [employer] in [place]. He is doing quite well and wants to get on his tractor. He denies any chest pain, dyspnea, dizziness or presyncope. She is checking his blood pressure twice a day and is running between 384 and 665 systolic. She is worried it's getting too low. He's had no dizziness. He is walking every day and eating well. He is to start cardiac rehabilitation soon. He has not tolerated any of the statins and had to stop the Lipitor because of severe muscle aches. They can't remember how many statins he's failed but states that Dr. Luan Pulling has tried several.    Past Medical History  Diagnosis Date  . Kidney stone   . Type II diabetes mellitus   . Hypertension   . Hypothyroidism   . Arthritis     "back, hips, legs" (06/10/2015)  . DJD (degenerative joint disease)   . Chronic lower back pain   . Hyperlipidemia 06/11/2015  . Depression   . Chest pain   . Angina, class II     Past Surgical History  Procedure Laterality Date  . Dupuytren contracture release Bilateral 2000's  . Colonoscopy  06/21/2011    Procedure: COLONOSCOPY;  Surgeon: Rogene Houston, MD;  [facility];  Service: Endoscopy;   Laterality: N/A;  . Skin cancer excision Left     "forearm"  . Cardiac catheterization N/A 06/11/2015    Procedure: Left Heart Cath and Coronary Angiography;  Surgeon: Belva Crome, MD;  Location: [facility];  Service: Cardiovascular;  Laterality: N/A;  . Coronary artery bypass graft N/A 06/14/2015    Procedure: CORONARY ARTERY BYPASS GRAFTING times four using Left Internal mammary artery and right leg Saphenous vein graft;  Surgeon: Ivin Poot, MD;  Location: [facility];  Service: Open Heart Surgery;  Laterality: N/A;  . Tee without cardioversion N/A 06/14/2015    Procedure: TRANSESOPHAGEAL ECHOCARDIOGRAM (TEE);  Surgeon: Ivin Poot, MD;  Location: [facility];  Service: Open Heart Surgery;  Laterality: N/A;  . Hand surgery    . Colonoscopy       Current Outpatient Prescriptions  Medication Sig Dispense Refill  . aspirin EC 325 MG EC tablet Take 1 tablet (325 mg total) by mouth daily. 30 tablet 0  . levothyroxine (SYNTHROID, LEVOTHROID) 50 MCG tablet Take 50 mcg by mouth daily before breakfast.     . metFORMIN (GLUCOPHAGE) 500 MG tablet Take 500 mg by mouth 2 (two) times daily with a meal.      . metoprolol tartrate (LOPRESSOR) 25 MG tablet Take 1 tablet (25 mg total) by mouth 2 (two) times daily. 60 tablet 3  . traMADol (ULTRAM)  50 MG tablet Take 1 tablet (50 mg total) by mouth every 6 (six) hours as needed. pain 30 tablet 0   No current facility-administered medications for this visit.    Allergies:   Aspirin and Penicillins    Social History:  The patient  reports that he quit smoking about 12 years ago. His smoking use included Cigarettes. He has a 141 pack-year smoking history. His smokeless tobacco use includes Chew. He reports that he does not drink alcohol or use illicit drugs.   Family History:  The patient's    family history includes Cancer in his brother and father; Hypertension in his mother; Liver cancer in his father; Rectal cancer in his brother; Stomach cancer in  his father; Stroke in his mother. There is no history of Heart attack.    ROS:  Please see the history of present illness.   Otherwise, review of systems are positive for anxiety depression, back pain.   All other systems are reviewed and negative.    PHYSICAL EXAM: VS:  BP 110/60 mmHg  Pulse 69  Ht 6' (1.829 m)  Wt 176 lb (79.833 kg)  BMI 23.86 kg/m2 , BMI Body mass index is 23.86 kg/(m^2). GEN: Well nourished, well developed, in no acute distress Neck: no JVD, HJR, carotid bruits, or masses Cardiac:  Incision healing well RRR; no murmurs,gallop, rubs, thrill or heave,  Respiratory:  clear to auscultation bilaterally, normal work of breathing GI: soft, nontender, nondistended, + BS MS: no deformity or atrophy Extremities: without cyanosis, clubbing, edema, good distal pulses bilaterally.  Skin: warm and dry, no rash Neuro:  Strength and sensation are intact    EKG:  EKG is ordered today. The ekg ordered today demonstrates normal sinus rhythm, normal EKG  Recent Labs: 06/11/2015: ALT 16* 06/15/2015: Magnesium 2.0 06/18/2015: Hemoglobin 8.1*; Platelets 147* 06/19/2015: BUN 19; Creatinine, Ser 1.33*; Potassium 4.5; Sodium 138    Lipid Panel    Component Value Date/Time   CHOL 222* 06/11/2015 0130   TRIG 118 06/11/2015 0130   HDL 37* 06/11/2015 0130   CHOLHDL 6.0 06/11/2015 0130   VLDL 24 06/11/2015 0130   LDLCALC 161* 06/11/2015 0130      Wt Readings from Last 3 Encounters:  07/13/15 176 lb (79.833 kg)  06/19/15 176 lb 6.4 oz (80.015 kg)  04/19/15 187 lb (84.823 kg)      Other studies Reviewed: Additional studies/ records that were reviewed today include and review of the records demonstrates:  Cardiac catheterization done by Dr. Tamala Julian on 06/11/2015: 1. Prox LAD to Mid LAD lesion, 95% stenosed. 2. 2nd Diag lesion, 70% stenosed. 3. 3rd Diag lesion, 90% stenosed. 4. Mid Cx lesion, 99% stenosed. 5. Dist Cx lesion, 40% stenosed. 6. Dist RCA lesion, 90%  stenosed. 7. The left ventricular systolic function is normal.    Severe three-vessel coronary disease involving the proximal LAD, mid circumflex, and distal RCA in a diabetic.  Normal left ventricular function  Clinical presentation with unstable angina Coronary artery bypass grafting x4 (left internal mammary artery to left anterior descending, saphenous vein graft to first diagonal, saphenous vein graft to OM-2, saphenous vein graft to posterior descending). Endoscopic harvest of right leg greater saphenous vein by Dr. Prescott Gum on 06/14/2015.  2-D echo 06/12/15 Study Conclusions  - Left ventricle: The cavity size was normal. Wall thickness was   normal. Systolic function was normal. The estimated ejection   fraction was in the range of 60% to 65%. Wall motion was normal;  there were no regional wall motion abnormalities. Doppler   parameters are consistent with abnormal left ventricular   relaxation (grade 1 diastolic dysfunction). - Aortic valve: Mildly to moderately calcified annulus. Trileaflet. - Mitral valve: Mildly calcified annulus. Mildly calcified leaflets   . There was trivial regurgitation. - Tricuspid valve: There was mild regurgitation. - Pulmonary arteries: PA peak pressure: 26 mm Hg (S). - Systemic veins: IVC dilated with normal respiratory variation.   Estimated CVP 8 mmHg.  ASSESSMENT AND PLAN:  S/P CABG x 4 Patient doing well status post CABG. Recommend cardiac rehabilitation. He has not tolerated statins in the past. Follow-up with Dr. Harl Bowie to discuss further recommendations.  Essential hypertension Blood pressure running on the low side. Will stop amlodipine. His wife will check his blood pressures daily and call us if there are elevated. He is been on lisinopril in the past but this was stopped due to renal insufficiency post-CABG. We'll repeat   labs today.  Hyperlipidemia Has not tolerated statins in the past. Discussed with Dr. Harl Bowie at next  visit.    Sumner Boast, PA-C  07/13/2015 11:16 AM    [facility] [address], [place], [place]  [address] Phone: [phone]; Fax: [phone]

## 2015-07-13 NOTE — Patient Instructions (Signed)
Medication Instructions:  Your physician has recommended you make the following change in your medication:  1- STOP amlodipine   Labwork: Your physician recommends that you have lab work today - BMET.  Testing/Procedures: NONE  Follow-Up: Your physician recommends that you schedule a follow-up appointment in: 6 weeks with Dr. Harl Bowie.  Any Other Special Instructions Will Be Listed Below   Please check your blood pressure (BP) daily and call the office if your BP is elevated.

## 2015-07-20 ENCOUNTER — Other Ambulatory Visit: Payer: Self-pay | Admitting: Cardiothoracic Surgery

## 2015-07-20 DIAGNOSIS — Z951 Presence of aortocoronary bypass graft: Secondary | ICD-10-CM

## 2015-07-21 ENCOUNTER — Ambulatory Visit: Payer: Medicare Other | Admitting: Nutrition

## 2015-07-21 ENCOUNTER — Ambulatory Visit: Payer: Medicare Other | Admitting: Cardiothoracic Surgery

## 2015-07-22 ENCOUNTER — Ambulatory Visit (INDEPENDENT_AMBULATORY_CARE_PROVIDER_SITE_OTHER): Payer: Self-pay | Admitting: Cardiothoracic Surgery

## 2015-07-22 ENCOUNTER — Ambulatory Visit: Payer: Medicare Other | Admitting: Cardiothoracic Surgery

## 2015-07-22 ENCOUNTER — Ambulatory Visit
Admission: RE | Admit: 2015-07-22 | Discharge: 2015-07-22 | Disposition: A | Discharge status: Home / Self Care | Payer: Medicare Other | Source: Ambulatory Visit | Attending: Cardiothoracic Surgery | Admitting: Cardiothoracic Surgery

## 2015-07-22 ENCOUNTER — Encounter: Payer: Self-pay | Admitting: Cardiothoracic Surgery

## 2015-07-22 VITALS — BP 116/73 | HR 69 | Resp 16 | Ht 72.0 in | Wt 176.0 lb

## 2015-07-22 DIAGNOSIS — Z951 Presence of aortocoronary bypass graft: Secondary | ICD-10-CM | POA: Diagnosis not present

## 2015-07-22 NOTE — Progress Notes (Signed)
PCP is Alonza Bogus, MD Referring Provider is Jettie Booze, MD  Chief Complaint  Patient presents with  . Routine Post Op    s/p CAB X 4 06/14/15 with a cxr    HPI:one month followup after urgent CABG for severe three-vessel disease with unstable angina. Preserved LV function. Patient is been doing well--walks one hour daily. No recurrent chest pain or symptoms of CHF. Incision is well-healed Chest x-ray today  is clear Maintaining normal sinus rhythm Wants to increase his activity levels.   Past Medical History  Diagnosis Date  . Kidney stone   . Type II diabetes mellitus   . Hypertension   . Hypothyroidism   . Arthritis     "back, hips, legs" (06/10/2015)  . DJD (degenerative joint disease)   . Chronic lower back pain   . Hyperlipidemia 06/11/2015  . Depression   . Chest pain   . Angina, class II     Past Surgical History  Procedure Laterality Date  . Dupuytren contracture release Bilateral 2000's  . Colonoscopy  06/21/2011    Procedure: COLONOSCOPY;  Surgeon: Rogene Houston, MD;  [facility];  Service: Endoscopy;  Laterality: N/A;  . Skin cancer excision Left     "forearm"  . Cardiac catheterization N/A 06/11/2015    Procedure: Left Heart Cath and Coronary Angiography;  Surgeon: Belva Crome, MD;  Location: [facility];  Service: Cardiovascular;  Laterality: N/A;  . Coronary artery bypass graft N/A 06/14/2015    Procedure: CORONARY ARTERY BYPASS GRAFTING times four using Left Internal mammary artery and right leg Saphenous vein graft;  Surgeon: Ivin Poot, MD;  Location: [facility];  Service: Open Heart Surgery;  Laterality: N/A;  . Tee without cardioversion N/A 06/14/2015    Procedure: TRANSESOPHAGEAL ECHOCARDIOGRAM (TEE);  Surgeon: Ivin Poot, MD;  Location: [facility];  Service: Open Heart Surgery;  Laterality: N/A;  . Hand surgery    . Colonoscopy      Family History  Problem Relation Age of Onset  . Stomach cancer Father   . Stroke  Mother   . Hypertension Mother   . Rectal cancer Brother   . Liver cancer Father   . Heart attack Neg Hx   . Cancer Father   . Cancer Brother     Social History Social History  Substance Use Topics  . Smoking status: Former Smoker -- 3.00 packs/day for 47 years    Types: Cigarettes    Quit date: 06/21/2003  . Smokeless tobacco: Current User    Types: Chew  . Alcohol Use: No    Current Outpatient Prescriptions  Medication Sig Dispense Refill  . aspirin EC 325 MG EC tablet Take 1 tablet (325 mg total) by mouth daily. 30 tablet 0  . levothyroxine (SYNTHROID, LEVOTHROID) 50 MCG tablet Take 50 mcg by mouth daily before breakfast.     . metFORMIN (GLUCOPHAGE) 500 MG tablet Take 500 mg by mouth 2 (two) times daily with a meal.      . metoprolol tartrate (LOPRESSOR) 25 MG tablet Take 1 tablet (25 mg total) by mouth 2 (two) times daily. 60 tablet 3  . traMADol (ULTRAM) 50 MG tablet Take 1 tablet (50 mg total) by mouth every 6 (six) hours as needed. pain 30 tablet 0   No current facility-administered medications for this visit.    Allergies  Allergen Reactions  . Aspirin Itching    Patient received ASA 324mg  PO, chewed, in ER on  06/10/2015 without any specific reaction.  Re-coded as intolerance, not allergy.  . Penicillins Rash    Review of Systems  Appetite and strength improving Insomnia improving No significant surgical pain He does have chronic low back pain No edema   BP 116/73 mmHg  Pulse 69  Resp 16  Ht 6' (1.829 m)  Wt 176 lb (79.833 kg)  BMI 23.86 kg/m2  SpO2 98% Physical Exam Alert comfortable Lungs clear Sternum stable well-healed Heart rhythm regular murmur Abdomen soft Extremities warm without edema  Diagnostic Tests: Chest x-ray clear without pleural effusion  Impression: Early recovery after CABG so far uncomplicated He was encouraged to start outpatient cardiac rehabilitation at [facility] He does not lift more than 20 pounds until 3 months  after his surgery. He'll continue his current medications and return here as needed    Len Childs, MD [facility] [phone]

## 2015-07-27 ENCOUNTER — Encounter (HOSPITAL_COMMUNITY): Payer: Medicare Other

## 2015-08-18 LAB — HEMOGLOBIN A1C: HEMOGLOBIN A1C: 6.7 % — AB (ref 4.0–6.0)

## 2015-08-25 DIAGNOSIS — I10 Essential (primary) hypertension: Secondary | ICD-10-CM | POA: Diagnosis not present

## 2015-08-25 DIAGNOSIS — E039 Hypothyroidism, unspecified: Secondary | ICD-10-CM | POA: Diagnosis not present

## 2015-08-25 DIAGNOSIS — E1165 Type 2 diabetes mellitus with hyperglycemia: Secondary | ICD-10-CM | POA: Diagnosis not present

## 2015-08-25 DIAGNOSIS — E782 Mixed hyperlipidemia: Secondary | ICD-10-CM | POA: Diagnosis not present

## 2015-08-27 ENCOUNTER — Ambulatory Visit (INDEPENDENT_AMBULATORY_CARE_PROVIDER_SITE_OTHER): Payer: Medicare Other | Admitting: Cardiology

## 2015-08-27 ENCOUNTER — Encounter: Payer: Self-pay | Admitting: Cardiology

## 2015-08-27 VITALS — BP 112/62 | HR 70 | Ht 72.0 in | Wt 180.6 lb

## 2015-08-27 DIAGNOSIS — I2510 Atherosclerotic heart disease of native coronary artery without angina pectoris: Secondary | ICD-10-CM

## 2015-08-27 DIAGNOSIS — I10 Essential (primary) hypertension: Secondary | ICD-10-CM | POA: Diagnosis not present

## 2015-08-27 DIAGNOSIS — E785 Hyperlipidemia, unspecified: Secondary | ICD-10-CM

## 2015-08-27 MED ORDER — EZETIMIBE 10 MG PO TABS: 10.0000 mg | ORAL_TABLET | Freq: Every day | ORAL | Status: DC

## 2015-08-27 NOTE — Progress Notes (Signed)
Patient ID: Hunter Sparks, male   DOB: [birth year], 69 y.o.   MRN: [id number]     Clinical Summary Hunter Sparks is a 69 y.o.male seen today for hospital follow up appointment, this is our first visit together.   1. CAD - s/p CABG 06/2015 (LIMA-LAD,SVG-D1,SVG-OM2,SVG-PDA) - 05/2015 echo LVEF 60-65%, no WMAs, grade I diastolic dysfunction  - no chest pain. Occasional fluttering feeling in chest lasting just a few seconds.  - denies any SOB or DOE.  - walks regularly up 3 miles without troubles - compliant with meds - has not been interested in cardiac rehab  2. Hyperlipidemia - intolerant to statins due to muscle aches. Tried 3 different statins (pravastatin,lipitor,zocor) - - 05/2015 TC 222 HDL 37 LDL 161  3. HTN - low bp's last visit, his norvasc was stopped.  - lisionpril stopped previously due to renal insufficiency    Past Medical History  Diagnosis Date  . Kidney stone   . Type II diabetes mellitus   . Hypertension   . Hypothyroidism   . Arthritis     "back, hips, legs" (06/10/2015)  . DJD (degenerative joint disease)   . Chronic lower back pain   . Hyperlipidemia 06/11/2015  . Depression   . Chest pain   . Angina, class II      Allergies  Allergen Reactions  . Aspirin Itching    Patient received ASA 324mg  PO, chewed, in ER on 06/10/2015 without any specific reaction.  Re-coded as intolerance, not allergy.  . Penicillins Rash     Current Outpatient Prescriptions  Medication Sig Dispense Refill  . aspirin EC 325 MG EC tablet Take 1 tablet (325 mg total) by mouth daily. 30 tablet 0  . levothyroxine (SYNTHROID, LEVOTHROID) 50 MCG tablet Take 50 mcg by mouth daily before breakfast.     . metFORMIN (GLUCOPHAGE) 500 MG tablet Take 500 mg by mouth 2 (two) times daily with a meal.      . metoprolol tartrate (LOPRESSOR) 25 MG tablet Take 1 tablet (25 mg total) by mouth 2 (two) times daily. 60 tablet 3  . traMADol (ULTRAM) 50 MG tablet Take 1 tablet (50 mg total) by  mouth every 6 (six) hours as needed. pain 30 tablet 0   No current facility-administered medications for this visit.     Past Surgical History  Procedure Laterality Date  . Dupuytren contracture release Bilateral 2000's  . Colonoscopy  06/21/2011    Procedure: COLONOSCOPY;  Surgeon: Rogene Houston, MD;  [facility];  Service: Endoscopy;  Laterality: N/A;  . Skin cancer excision Left     "forearm"  . Cardiac catheterization N/A 06/11/2015    Procedure: Left Heart Cath and Coronary Angiography;  Surgeon: Belva Crome, MD;  Location: [place] [facility];  Service: Cardiovascular;  Laterality: N/A;  . Coronary artery bypass graft N/A 06/14/2015    Procedure: CORONARY ARTERY BYPASS GRAFTING times four using Left Internal mammary artery and right leg Saphenous vein graft;  Surgeon: Ivin Poot, MD;  Location: [place];  Service: Open Heart Surgery;  Laterality: N/A;  . Tee without cardioversion N/A 06/14/2015    Procedure: TRANSESOPHAGEAL ECHOCARDIOGRAM (TEE);  Surgeon: Ivin Poot, MD;  Location: [place];  Service: Open Heart Surgery;  Laterality: N/A;  . Hand surgery    . Colonoscopy       Allergies  Allergen Reactions  . Aspirin Itching    Patient received ASA 324mg  PO, chewed, in ER on 06/10/2015 without  any specific reaction.  Re-coded as intolerance, not allergy.  . Penicillins Rash      Family History  Problem Relation Age of Onset  . Stomach cancer Father   . Stroke Mother   . Hypertension Mother   . Rectal cancer Brother   . Liver cancer Father   . Heart attack Neg Hx   . Cancer Father   . Cancer Brother      Social History Hunter Sparks reports that he quit smoking about 12 years ago. His smoking use included Cigarettes. He has a 141 pack-year smoking history. His smokeless tobacco use includes Chew. Hunter Sparks reports that he does not drink alcohol.   Review of Systems CONSTITUTIONAL: No weight loss, fever, chills, weakness or fatigue.  HEENT: Eyes:  No visual loss, blurred vision, double vision or yellow sclerae.No hearing loss, sneezing, congestion, runny nose or sore throat.  SKIN: No rash or itching.  CARDIOVASCULAR: per HPI RESPIRATORY: No shortness of breath, cough or sputum.  GASTROINTESTINAL: No anorexia, nausea, vomiting or diarrhea. No abdominal pain or blood.  GENITOURINARY: No burning on urination, no polyuria NEUROLOGICAL: No headache, dizziness, syncope, paralysis, ataxia, numbness or tingling in the extremities. No change in bowel or bladder control.  MUSCULOSKELETAL: No muscle, back pain, joint pain or stiffness.  LYMPHATICS: No enlarged nodes. No history of splenectomy.  PSYCHIATRIC: No history of depression or anxiety.  ENDOCRINOLOGIC: No reports of sweating, cold or heat intolerance. No polyuria or polydipsia.  Marland Kitchen   Physical Examination Filed Vitals:   08/27/15 0950  BP: 112/62  Pulse: 70   Filed Vitals:   08/27/15 0950  Height: 6' (1.829 m)  Weight: 180 lb 9.6 oz (81.92 kg)    Gen: resting comfortably, no acute distress HEENT: no scleral icterus, pupils equal round and reactive, no palptable cervical adenopathy,  CV: RRR, no m/r/g, no jvd Resp: Clear to auscultation bilaterally GI: abdomen is soft, non-tender, non-distended, normal bowel sounds, no hepatosplenomegaly MSK: extremities are warm, no edema.  Skin: warm, no rash Neuro:  no focal deficits Psych: appropriate affect   Diagnostic Studies 05/2015 echo Study Conclusions  - Left ventricle: The cavity size was normal. Wall thickness was normal. Systolic function was normal. The estimated ejection fraction was in the range of 60% to 65%. Wall motion was normal; there were no regional wall motion abnormalities. Doppler parameters are consistent with abnormal left ventricular relaxation (grade 1 diastolic dysfunction). - Aortic valve: Mildly to moderately calcified annulus. Trileaflet. - Mitral valve: Mildly calcified annulus. Mildly  calcified leaflets . There was trivial regurgitation. - Tricuspid valve: There was mild regurgitation. - Pulmonary arteries: PA peak pressure: 26 mm Hg (S). - Systemic veins: IVC dilated with normal respiratory variation. Estimated CVP 8 mmHg.   05/2015 cath 1. Prox LAD to Mid LAD lesion, 95% stenosed. 2. 2nd Diag lesion, 70% stenosed. 3. 3rd Diag lesion, 90% stenosed. 4. Mid Cx lesion, 99% stenosed. 5. Dist Cx lesion, 40% stenosed. 6. Dist RCA lesion, 90% stenosed. 7. The left ventricular systolic function is normal.   Severe three-vessel coronary disease involving the proximal LAD, mid circumflex, and distal RCA in a diabetic.  Normal left ventricular function  Clinical presentation with unstable angina   05/2015 Carotid US Summary:  - Bilateral - 1% to 39% ICA stenosis. Vertebral artery flow is  antegrade. - Palmar arch evaluation - Doppler waveforms remained normal  bilaterally with both radial and ulnar compressions. - ABIs and Doppler waveforms are within normal limits bilaterally  at  rest.    Assessment and Plan  1. CAD - no current symptoms, continue current meds. Change to low dose ASA 81mg  daily.   2. Hyperlipidemia - above goal, intolerant to statins. Will start zetia 10mg  daily  3. HTN - at goal, continue current meds      Arnoldo Lenis, M.D.

## 2015-08-27 NOTE — Patient Instructions (Signed)
Your physician wants you to follow-up in: 6 months with Dr Bryna Colander will receive a reminder letter in the mail two months in advance. If you don't receive a letter, please call our office to schedule the follow-up appointment.   DECREASE to  aspirin 81 mg day   START Zetia 10 mg daily    Thank you for choosing [facility] !

## 2015-08-30 ENCOUNTER — Ambulatory Visit: Payer: Medicare Other | Admitting: Cardiovascular Disease

## 2015-09-01 ENCOUNTER — Ambulatory Visit (INDEPENDENT_AMBULATORY_CARE_PROVIDER_SITE_OTHER): Payer: Medicare Other | Admitting: "Endocrinology

## 2015-09-01 ENCOUNTER — Encounter: Payer: Self-pay | Admitting: "Endocrinology

## 2015-09-01 VITALS — BP 155/93 | HR 65 | Ht 72.0 in | Wt 185.0 lb

## 2015-09-01 DIAGNOSIS — E039 Hypothyroidism, unspecified: Secondary | ICD-10-CM

## 2015-09-01 DIAGNOSIS — E1159 Type 2 diabetes mellitus with other circulatory complications: Secondary | ICD-10-CM

## 2015-09-01 DIAGNOSIS — IMO0002 Reserved for concepts with insufficient information to code with codable children: Secondary | ICD-10-CM | POA: Insufficient documentation

## 2015-09-01 DIAGNOSIS — E118 Type 2 diabetes mellitus with unspecified complications: Secondary | ICD-10-CM

## 2015-09-01 DIAGNOSIS — I10 Essential (primary) hypertension: Secondary | ICD-10-CM

## 2015-09-01 DIAGNOSIS — E785 Hyperlipidemia, unspecified: Secondary | ICD-10-CM

## 2015-09-01 DIAGNOSIS — E1165 Type 2 diabetes mellitus with hyperglycemia: Secondary | ICD-10-CM | POA: Insufficient documentation

## 2015-09-01 DIAGNOSIS — E119 Type 2 diabetes mellitus without complications: Secondary | ICD-10-CM | POA: Insufficient documentation

## 2015-09-01 DIAGNOSIS — Z794 Long term (current) use of insulin: Secondary | ICD-10-CM | POA: Insufficient documentation

## 2015-09-01 NOTE — Progress Notes (Signed)
Subjective:    Patient ID: Hunter Sparks, male    DOB: 02/29/1946,    Past Medical History  Diagnosis Date  . Kidney stone   . Type II diabetes mellitus ([place])   . Hypertension   . Hypothyroidism   . Arthritis     "back, hips, legs" (06/10/2015)  . DJD (degenerative joint disease)   . Chronic lower back pain   . Hyperlipidemia 06/11/2015  . Depression   . Chest pain   . Angina, class II [facility])    Past Surgical History  Procedure Laterality Date  . Dupuytren contracture release Bilateral 2000's  . Colonoscopy  06/21/2011    Procedure: COLONOSCOPY;  Surgeon: Rogene Houston, MD;  [facility];  Service: Endoscopy;  Laterality: N/A;  . Skin cancer excision Left     "forearm"  . Cardiac catheterization N/A 06/11/2015    Procedure: Left Heart Cath and Coronary Angiography;  Surgeon: Belva Crome, MD;  Location: [place] [facility];  Service: Cardiovascular;  Laterality: N/A;  . Coronary artery bypass graft N/A 06/14/2015    Procedure: CORONARY ARTERY BYPASS GRAFTING times four using Left Internal mammary artery and right leg Saphenous vein graft;  Surgeon: Ivin Poot, MD;  Location: [place];  Service: Open Heart Surgery;  Laterality: N/A;  . Tee without cardioversion N/A 06/14/2015    Procedure: TRANSESOPHAGEAL ECHOCARDIOGRAM (TEE);  Surgeon: Ivin Poot, MD;  Location: [place];  Service: Open Heart Surgery;  Laterality: N/A;  . Hand surgery    . Colonoscopy     Social History   Social History  . Marital Status: Married    Spouse Name: N/A  . Number of Children: N/A  . Years of Education: N/A   Social History Main Topics  . Smoking status: Former Smoker -- 3.00 packs/day for 47 years    Types: Cigarettes    Quit date: 11/20/1994  . Smokeless tobacco: Current User    Types: Chew  . Alcohol Use: No  . Drug Use: No  . Sexual Activity: No   Other Topics Concern  . None   Social History Narrative   Outpatient Encounter Prescriptions as of 09/01/2015   Medication Sig  . aspirin EC 81 MG tablet Take 81 mg by mouth daily.  Marland Kitchen ezetimibe (ZETIA) 10 MG tablet Take 1 tablet (10 mg total) by mouth daily.  Marland Kitchen levothyroxine (SYNTHROID, LEVOTHROID) 50 MCG tablet Take 50 mcg by mouth daily before breakfast.   . metFORMIN (GLUCOPHAGE) 500 MG tablet Take 500 mg by mouth 2 (two) times daily with a meal.    . metoprolol tartrate (LOPRESSOR) 25 MG tablet Take 1 tablet (25 mg total) by mouth 2 (two) times daily.  . traMADol (ULTRAM) 50 MG tablet Take 1 tablet (50 mg total) by mouth every 6 (six) hours as needed. pain   No facility-administered encounter medications on file as of 09/01/2015.   ALLERGIES: Allergies  Allergen Reactions  . Aspirin Itching    Patient received ASA 324mg  PO, chewed, in ER on 06/10/2015 without any specific reaction.  Re-coded as intolerance, not allergy.  . Penicillins Rash   VACCINATION STATUS:  There is no immunization history on file for this patient.  Diabetes He presents for his follow-up diabetic visit. He has type 2 diabetes mellitus. Onset time: He was diagnosed at approximate age of 7 years. His disease course has been improving. There are no hypoglycemic associated symptoms. Pertinent negatives for hypoglycemia include no confusion, headaches,  pallor or seizures. There are no diabetic associated symptoms. Pertinent negatives for diabetes include no chest pain, no fatigue, no polydipsia, no polyphagia, no polyuria and no weakness. There are no hypoglycemic complications. Symptoms are improving. Diabetic complications include heart disease. Risk factors for coronary artery disease include dyslipidemia, diabetes mellitus, hypertension, male sex, sedentary lifestyle and tobacco exposure. Current diabetic treatment includes oral agent (monotherapy). He is compliant with treatment most of the time. He has had a previous visit with a dietitian. He participates in exercise intermittently.  Hypertension This is a chronic problem.  The current episode started more than 1 year ago. Pertinent negatives include no chest pain, headaches, neck pain, palpitations or shortness of breath. Risk factors for coronary artery disease include diabetes mellitus, dyslipidemia, sedentary lifestyle and smoking/tobacco exposure.  Hyperlipidemia This is a chronic problem. The current episode started more than 1 year ago. Pertinent negatives include no chest pain, myalgias or shortness of breath.     Review of Systems  Constitutional: Negative for fatigue and unexpected weight change.  HENT: Negative for dental problem, mouth sores and trouble swallowing.   Eyes: Negative for visual disturbance.  Respiratory: Negative for cough, choking, chest tightness, shortness of breath and wheezing.   Cardiovascular: Negative for chest pain, palpitations and leg swelling.       Since last visit, he underwent quadruple coronary artery bypass graft.  Gastrointestinal: Negative for nausea, vomiting, abdominal pain, diarrhea, constipation and abdominal distention.  Endocrine: Negative for polydipsia, polyphagia and polyuria.  Genitourinary: Negative for dysuria, urgency, hematuria and flank pain.  Musculoskeletal: Negative for myalgias, back pain, gait problem and neck pain.  Skin: Negative for pallor, rash and wound.  Neurological: Negative for seizures, syncope, weakness, numbness and headaches.  Psychiatric/Behavioral: Negative.  Negative for confusion and dysphoric mood.    Objective:    BP 155/93 mmHg  Pulse 65  Ht 6' (1.829 m)  Wt 185 lb (83.915 kg)  BMI 25.08 kg/m2  SpO2 98%  Wt Readings from Last 3 Encounters:  09/01/15 185 lb (83.915 kg)  08/27/15 180 lb 9.6 oz (81.92 kg)  07/22/15 176 lb (79.833 kg)    Physical Exam  Constitutional: He is oriented to person, place, and time. He appears well-developed and well-nourished. He is cooperative. No distress.  HENT:  Head: Normocephalic and atraumatic.  Eyes: EOM are normal.  Neck: Normal  range of motion. Neck supple. No tracheal deviation present. No thyromegaly present.  Cardiovascular: Normal rate, S1 normal, S2 normal and normal heart sounds.  Exam reveals no gallop.   No murmur heard. Pulses:      Dorsalis pedis pulses are 1+ on the right side, and 1+ on the left side.       Posterior tibial pulses are 1+ on the right side, and 1+ on the left side.  Healing sternotomy surgical wound for CABG.  Pulmonary/Chest: Breath sounds normal. No respiratory distress. He has no wheezes.  Abdominal: Soft. Bowel sounds are normal. He exhibits no distension. There is no tenderness. There is no guarding and no CVA tenderness.  Musculoskeletal: He exhibits no edema.       Right shoulder: He exhibits no swelling and no deformity.  Neurological: He is alert and oriented to person, place, and time. He has normal strength and normal reflexes. No cranial nerve deficit or sensory deficit. Gait normal.  Skin: Skin is warm and dry. No rash noted. No cyanosis. Nails show no clubbing.  Psychiatric: He has a normal mood and affect. His speech  is normal and behavior is normal. Judgment and thought content normal. Cognition and memory are normal.    Results for orders placed or performed in visit on 25/95/63  Basic Metabolic Panel (BMET)  Result Value Ref Range   Sodium 138 135 - 145 mEq/L   Potassium 4.5 3.5 - 5.1 mEq/L   Chloride 101 96 - 112 mEq/L   CO2 32 19 - 32 mEq/L   Glucose, Bld 148 (H) 70 - 99 mg/dL   BUN 18 6 - 23 mg/dL   Creatinine, Ser 0.92 0.40 - 1.50 mg/dL   Calcium 9.3 8.4 - 10.5 mg/dL   GFR 86.21 >60.00 mL/min   Complete Blood Count (Most recent): Lab Results  Component Value Date   WBC 5.7 06/18/2015   HGB 8.1* 06/18/2015   HCT 24.2* 06/18/2015   MCV 90.3 06/18/2015   PLT 147* 06/18/2015   Chemistry (most recent): Lab Results  Component Value Date   NA 138 07/13/2015   K 4.5 07/13/2015   CL 101 07/13/2015   CO2 32 07/13/2015   BUN 18 07/13/2015   CREATININE 0.92  07/13/2015   Diabetic Labs (most recent): Lab Results  Component Value Date   HGBA1C 7.7* 06/10/2015   Lipid profile (most recent): Lab Results  Component Value Date   TRIG 118 06/11/2015   CHOL 222* 06/11/2015         Assessment & Plan:   1. Type 2 diabetes mellitus with vascular disease ([place]) His diabetes is  complicated by recent coronary artery disease status post coronary artery bypass graft. Patient came with improved A1c of 6.8 %.    Recent labs reviewed. - Patient remains at a high risk for more acute and chronic complications of diabetes which include CAD, CVA, CKD, retinopathy, and neuropathy. These are all discussed in detail with the patient.  - I have re-counseled the patient on diet management and   by adopting a carbohydrate restricted / protein rich  Diet. - Patient is advised to stick to a routine mealtimes to eat 3 meals  a day and avoid unnecessary snacks ( to snack only to correct hypoglycemia).  - Suggestion is made for patient to avoid simple carbohydrates   from their diet including Cakes , Desserts, Ice Cream,  Soda (  diet and regular) , Sweet Tea , Candies,  Chips, Cookies, Artificial Sweeteners,   and "Sugar-free" Products .  This will help patient to have stable blood glucose profile and potentially avoid unintended  Weight gain.  - The patient  has been  scheduled with Jearld Fenton, RDN, CDE for individualized DM education. - I have approached patient with the following individualized plan to manage diabetes and patient agrees.  - I will proceed with metformin 500 mg by mouth twice a day, therapeutically suitable for patient.. -Based on his progress, he will not need insulin therapy.   - Patient specific target  for A1c; LDL, HDL, Triglycerides, and  Waist Circumference were discussed in detail.  2) BP/HTN: Controlled. Continue current medications. 3) Lipids/HPL: continue Zetia 10 mg by mouth daily at bedtime.   4)  Weight/Diet: CDE consult in  progress, exercise, and carbohydrates information provided. 5) Hypothyroidism: He is clinically euthyroid. Advised him to continue levothyroxine 50 g by mouth every morning. - We discussed about correct intake of levothyroxine, at fasting, with water, separated by at least 30 minutes from breakfast, and separated by more than 4 hours from calcium, iron, multivitamins, acid reflux medications (PPIs). -Patient is made aware  of the fact that thyroid hormone replacement is needed for life, dose to be adjusted by periodic monitoring of thyroid function tests.  6) Chronic Care/Health Maintenance:  -Patient is encouraged to continue to follow up with Ophthalmology, Podiatrist at least yearly or according to recommendations, and advised to  stay away from smoking. I have recommended yearly flu vaccine and pneumonia vaccination at least every 5 years; moderate intensity exercise for up to 150 minutes weekly; and  sleep for at least 7 hours a day.  I advised patient to maintain close follow up with their PCP for primary care needs.  Patient is asked to bring meter and  blood glucose logs during their next visit.   Follow up plan: Return in about 3 months (around 12/02/2015) for diabetes, high blood pressure.  Glade Lloyd, MD Phone: [phone]  Fax: [phone]   09/01/2015, 2:35 PM

## 2015-09-15 ENCOUNTER — Other Ambulatory Visit: Payer: Self-pay | Admitting: Pulmonary Disease

## 2015-09-15 DIAGNOSIS — G8929 Other chronic pain: Secondary | ICD-10-CM

## 2015-09-15 DIAGNOSIS — M545 Low back pain, unspecified: Secondary | ICD-10-CM

## 2015-09-17 ENCOUNTER — Ambulatory Visit
Admission: RE | Admit: 2015-09-17 | Discharge: 2015-09-17 | Disposition: A | Discharge status: Home / Self Care | Payer: Medicare Other | Source: Ambulatory Visit | Attending: Pulmonary Disease | Admitting: Pulmonary Disease

## 2015-09-17 ENCOUNTER — Other Ambulatory Visit: Payer: Self-pay | Admitting: Pulmonary Disease

## 2015-09-17 DIAGNOSIS — G8929 Other chronic pain: Secondary | ICD-10-CM

## 2015-09-17 DIAGNOSIS — M545 Low back pain, unspecified: Secondary | ICD-10-CM

## 2015-09-17 MED ORDER — IOHEXOL 180 MG/ML  SOLN
1.0000 mL | Freq: Once | INTRAMUSCULAR | Status: DC | PRN
  Administered 2015-09-17: 1 mL via EPIDURAL

## 2015-09-17 MED ORDER — METHYLPREDNISOLONE ACETATE 40 MG/ML INJ SUSP (RADIOLOG
120.0000 mg | Freq: Once | INTRAMUSCULAR | Status: AC
  Administered 2015-09-17: 120 mg via EPIDURAL

## 2015-10-16 ENCOUNTER — Other Ambulatory Visit: Payer: Self-pay | Admitting: Physician Assistant

## 2015-12-02 ENCOUNTER — Ambulatory Visit: Payer: Medicare Other | Admitting: "Endocrinology

## 2015-12-06 ENCOUNTER — Other Ambulatory Visit: Payer: Self-pay

## 2015-12-06 ENCOUNTER — Telehealth: Payer: Self-pay | Admitting: Cardiology

## 2015-12-06 DIAGNOSIS — E785 Hyperlipidemia, unspecified: Secondary | ICD-10-CM

## 2015-12-06 NOTE — Telephone Encounter (Signed)
Pt is wondering if he will need lab work prior to his apt

## 2015-12-06 NOTE — Telephone Encounter (Signed)
Lipid profile ordered 

## 2015-12-08 DIAGNOSIS — C44209 Unspecified malignant neoplasm of skin of left ear and external auricular canal: Secondary | ICD-10-CM | POA: Diagnosis not present

## 2015-12-08 DIAGNOSIS — D2230 Melanocytic nevi of unspecified part of face: Secondary | ICD-10-CM | POA: Diagnosis not present

## 2015-12-08 DIAGNOSIS — D485 Neoplasm of uncertain behavior of skin: Secondary | ICD-10-CM | POA: Diagnosis not present

## 2016-01-06 ENCOUNTER — Other Ambulatory Visit: Payer: Self-pay | Admitting: Pulmonary Disease

## 2016-01-06 DIAGNOSIS — M545 Low back pain, unspecified: Secondary | ICD-10-CM

## 2016-01-06 DIAGNOSIS — G8929 Other chronic pain: Secondary | ICD-10-CM

## 2016-01-10 ENCOUNTER — Ambulatory Visit
Admission: RE | Admit: 2016-01-10 | Discharge: 2016-01-10 | Disposition: A | Discharge status: Home / Self Care | Payer: Medicare Other | Source: Ambulatory Visit | Attending: Pulmonary Disease | Admitting: Pulmonary Disease

## 2016-01-10 ENCOUNTER — Other Ambulatory Visit: Payer: Self-pay | Admitting: Pulmonary Disease

## 2016-01-10 DIAGNOSIS — M47816 Spondylosis without myelopathy or radiculopathy, lumbar region: Secondary | ICD-10-CM | POA: Diagnosis not present

## 2016-01-10 DIAGNOSIS — G8929 Other chronic pain: Secondary | ICD-10-CM

## 2016-01-10 DIAGNOSIS — M545 Low back pain: Principal | ICD-10-CM

## 2016-01-10 MED ORDER — METHYLPREDNISOLONE ACETATE 40 MG/ML INJ SUSP (RADIOLOG
120.0000 mg | Freq: Once | INTRAMUSCULAR | Status: AC
  Administered 2016-01-10: 120 mg via INTRA_ARTICULAR

## 2016-01-10 MED ORDER — IOHEXOL 180 MG/ML  SOLN
1.0000 mL | Freq: Once | INTRAMUSCULAR | Status: AC | PRN
  Administered 2016-01-10: 1 mL via INTRA_ARTICULAR

## 2016-01-10 NOTE — Discharge Instructions (Signed)

## 2016-01-26 DIAGNOSIS — C44219 Basal cell carcinoma of skin of left ear and external auricular canal: Secondary | ICD-10-CM | POA: Diagnosis not present

## 2016-02-18 DIAGNOSIS — E785 Hyperlipidemia, unspecified: Secondary | ICD-10-CM | POA: Diagnosis not present

## 2016-02-18 LAB — LIPID PANEL
CHOL/HDL RATIO: 5 ratio (ref <=5.0)
Cholesterol: 217 mg/dL — ABNORMAL HIGH (ref 125–200)
HDL: 43 mg/dL (ref >=40)
LDL CALC: 146 mg/dL — AB (ref <130)
TRIGLYCERIDES: 141 mg/dL (ref <150)
VLDL: 28 mg/dL (ref <30)

## 2016-02-24 ENCOUNTER — Ambulatory Visit (INDEPENDENT_AMBULATORY_CARE_PROVIDER_SITE_OTHER): Payer: Medicare Other | Admitting: Cardiology

## 2016-02-24 ENCOUNTER — Encounter: Payer: Self-pay | Admitting: Cardiology

## 2016-02-24 VITALS — BP 122/80 | HR 85 | Ht 72.0 in | Wt 183.0 lb

## 2016-02-24 DIAGNOSIS — I10 Essential (primary) hypertension: Secondary | ICD-10-CM | POA: Diagnosis not present

## 2016-02-24 DIAGNOSIS — E785 Hyperlipidemia, unspecified: Secondary | ICD-10-CM | POA: Diagnosis not present

## 2016-02-24 DIAGNOSIS — I2510 Atherosclerotic heart disease of native coronary artery without angina pectoris: Secondary | ICD-10-CM | POA: Diagnosis not present

## 2016-02-24 MED ORDER — METOPROLOL TARTRATE 25 MG PO TABS: 25.0000 mg | ORAL_TABLET | Freq: Two times a day (BID) | ORAL | Status: DC

## 2016-02-24 NOTE — Patient Instructions (Signed)
Your physician wants you to follow-up in: 6 months You will receive a reminder letter in the mail two months in advance. If you don't receive a letter, please call our office to schedule the follow-up appointment.    Get FASTING Lipid's JUST BEFORE next visit    Your physician recommends that you continue on your current medications as directed. Please refer to the Current Medication list given to you today.     Thank you for choosing [facility] !

## 2016-02-24 NOTE — Progress Notes (Signed)
Patient ID: Hunter Sparks, male   DOB: [birth year], 70 y.o.   MRN: [id number]     Clinical Summary Hunter Sparks is a 70 y.o.male seen today for f/u of the following medical problems.   1. CAD - s/p CABG 06/2015 (LIMA-LAD,SVG-D1,SVG-OM2,SVG-PDA) - 05/2015 echo LVEF 60-65%, no WMAs, grade I diastolic dysfunction  - denies any chest pain. No SOB or DOE. Remains very activie - compliant with meds   2. Hyperlipidemia - intolerant to statins due to muscle aches. Tried 3 different statins (pravastatin,lipitor,zocor) - - 05/2015 TC 222 HDL 37 LDL 161  3. HTN - low bp's last visit, his norvasc was stopped.  - lisionpril stopped previously due to renal insufficiency  4. History of tobacco abuse - normal CT abd Jan 2014 , no evidence of AAA  Past Medical History  Diagnosis Date  . Kidney stone   . Type II diabetes mellitus ([place])   . Hypertension   . Hypothyroidism   . Arthritis     "back, hips, legs" (06/10/2015)  . DJD (degenerative joint disease)   . Chronic lower back pain   . Hyperlipidemia 06/11/2015  . Depression   . Chest pain   . Angina, class II ([facility])      Allergies  Allergen Reactions  . Aspirin Itching    Patient received ASA 324mg  PO, chewed, in ER on 06/10/2015 without any specific reaction.  Re-coded as intolerance, not allergy.  . Penicillins Rash     Current Outpatient Prescriptions  Medication Sig Dispense Refill  . aspirin EC 81 MG tablet Take 81 mg by mouth daily.    Marland Kitchen ezetimibe (ZETIA) 10 MG tablet Take 1 tablet (10 mg total) by mouth daily. 90 tablet 3  . levothyroxine (SYNTHROID, LEVOTHROID) 50 MCG tablet Take 50 mcg by mouth daily before breakfast.     . metFORMIN (GLUCOPHAGE) 500 MG tablet Take 500 mg by mouth 2 (two) times daily with a meal.      . metoprolol tartrate (LOPRESSOR) 25 MG tablet Take 1 tablet (25 mg total) by mouth 2 (two) times daily. 60 tablet 3  . traMADol (ULTRAM) 50 MG tablet Take 1 tablet (50 mg total) by mouth every 6 (six)  hours as needed. pain 30 tablet 0   No current facility-administered medications for this visit.     Past Surgical History  Procedure Laterality Date  . Dupuytren contracture release Bilateral 2000's  . Colonoscopy  06/21/2011    Procedure: COLONOSCOPY;  Surgeon: Rogene Houston, MD;  [facility];  Service: Endoscopy;  Laterality: N/A;  . Skin cancer excision Left     "forearm"  . Cardiac catheterization N/A 06/11/2015    Procedure: Left Heart Cath and Coronary Angiography;  Surgeon: Belva Crome, MD;  Location: [facility];  Service: Cardiovascular;  Laterality: N/A;  . Coronary artery bypass graft N/A 06/14/2015    Procedure: CORONARY ARTERY BYPASS GRAFTING times four using Left Internal mammary artery and right leg Saphenous vein graft;  Surgeon: Ivin Poot, MD;  Location: [facility];  Service: Open Heart Surgery;  Laterality: N/A;  . Tee without cardioversion N/A 06/14/2015    Procedure: TRANSESOPHAGEAL ECHOCARDIOGRAM (TEE);  Surgeon: Ivin Poot, MD;  Location: [facility];  Service: Open Heart Surgery;  Laterality: N/A;  . Hand surgery    . Colonoscopy       Allergies  Allergen Reactions  . Aspirin Itching    Patient received ASA 324mg  PO, chewed, in ER on  06/10/2015 without any specific reaction.  Re-coded as intolerance, not allergy.  . Penicillins Rash      Family History  Problem Relation Age of Onset  . Stomach cancer Father   . Stroke Mother   . Hypertension Mother   . Rectal cancer Brother   . Liver cancer Father   . Heart attack Neg Hx   . Cancer Father   . Cancer Brother      Social History Hunter Sparks reports that he quit smoking about 21 years ago. His smoking use included Cigarettes. He has a 141 pack-year smoking history. His smokeless tobacco use includes Chew. Hunter Sparks reports that he does not drink alcohol.   Review of Systems CONSTITUTIONAL: No weight loss, fever, chills, weakness or fatigue.  HEENT: Eyes: No visual loss,  blurred vision, double vision or yellow sclerae.No hearing loss, sneezing, congestion, runny nose or sore throat.  SKIN: No rash or itching.  CARDIOVASCULAR: per HPI RESPIRATORY: No shortness of breath, cough or sputum.  GASTROINTESTINAL: No anorexia, nausea, vomiting or diarrhea. No abdominal pain or blood.  GENITOURINARY: No burning on urination, no polyuria NEUROLOGICAL: No headache, dizziness, syncope, paralysis, ataxia, numbness or tingling in the extremities. No change in bowel or bladder control.  MUSCULOSKELETAL: No muscle, back pain, joint pain or stiffness.  LYMPHATICS: No enlarged nodes. No history of splenectomy.  PSYCHIATRIC: No history of depression or anxiety.  ENDOCRINOLOGIC: No reports of sweating, cold or heat intolerance. No polyuria or polydipsia.  Marland Kitchen   Physical Examination Filed Vitals:   02/24/16 1308  BP: 122/80  Pulse: 85   Filed Vitals:   02/24/16 1308  Height: 6' (1.829 m)  Weight: 183 lb (83.008 kg)    Gen: resting comfortably, no acute distress HEENT: no scleral icterus, pupils equal round and reactive, no palptable cervical adenopathy,  CV: RRR, no m/r/g, no jvd Resp: Clear to auscultation bilaterally GI: abdomen is soft, non-tender, non-distended, normal bowel sounds, no hepatosplenomegaly MSK: extremities are warm, no edema.  Skin: warm, no rash Neuro:  no focal deficits Psych: appropriate affect   Diagnostic Studies 05/2015 echo Study Conclusions  - Left ventricle: The cavity size was normal. Wall thickness was normal. Systolic function was normal. The estimated ejection fraction was in the range of 60% to 65%. Wall motion was normal; there were no regional wall motion abnormalities. Doppler parameters are consistent with abnormal left ventricular relaxation (grade 1 diastolic dysfunction). - Aortic valve: Mildly to moderately calcified annulus. Trileaflet. - Mitral valve: Mildly calcified annulus. Mildly calcified leaflets .  There was trivial regurgitation. - Tricuspid valve: There was mild regurgitation. - Pulmonary arteries: PA peak pressure: 26 mm Hg (S). - Systemic veins: IVC dilated with normal respiratory variation. Estimated CVP 8 mmHg.   05/2015 cath 1. Prox LAD to Mid LAD lesion, 95% stenosed. 2. 2nd Diag lesion, 70% stenosed. 3. 3rd Diag lesion, 90% stenosed. 4. Mid Cx lesion, 99% stenosed. 5. Dist Cx lesion, 40% stenosed. 6. Dist RCA lesion, 90% stenosed. 7. The left ventricular systolic function is normal.   Severe three-vessel coronary disease involving the proximal LAD, mid circumflex, and distal RCA in a diabetic.  Normal left ventricular function  Clinical presentation with unstable angina   05/2015 Carotid US Summary:  - Bilateral - 1% to 39% ICA stenosis. Vertebral artery flow is  antegrade. - Palmar arch evaluation - Doppler waveforms remained normal  bilaterally with both radial and ulnar compressions. - ABIs and Doppler waveforms are within normal limits bilaterally  at  rest.    Assessment and Plan   1. CAD - no current symptoms  - we will continue current meds  2. Hyperlipidemia - above goal, intolerant to statins. He is not interested in zetia at this time, will repeat panel in 6 months and readdress next visit  3. HTN - at goal, we will continue current meds      Arnoldo Lenis, M.D.

## 2016-03-09 DIAGNOSIS — Z85828 Personal history of other malignant neoplasm of skin: Secondary | ICD-10-CM | POA: Diagnosis not present

## 2016-03-09 DIAGNOSIS — D485 Neoplasm of uncertain behavior of skin: Secondary | ICD-10-CM | POA: Diagnosis not present

## 2016-03-09 DIAGNOSIS — L570 Actinic keratosis: Secondary | ICD-10-CM | POA: Diagnosis not present

## 2016-03-09 DIAGNOSIS — D1801 Hemangioma of skin and subcutaneous tissue: Secondary | ICD-10-CM | POA: Diagnosis not present

## 2016-03-09 DIAGNOSIS — D225 Melanocytic nevi of trunk: Secondary | ICD-10-CM | POA: Diagnosis not present

## 2016-03-09 DIAGNOSIS — L821 Other seborrheic keratosis: Secondary | ICD-10-CM | POA: Diagnosis not present

## 2016-03-09 DIAGNOSIS — L720 Epidermal cyst: Secondary | ICD-10-CM | POA: Diagnosis not present

## 2016-03-23 DIAGNOSIS — E1165 Type 2 diabetes mellitus with hyperglycemia: Secondary | ICD-10-CM | POA: Diagnosis not present

## 2016-03-23 DIAGNOSIS — I2510 Atherosclerotic heart disease of native coronary artery without angina pectoris: Secondary | ICD-10-CM | POA: Diagnosis not present

## 2016-03-23 DIAGNOSIS — I10 Essential (primary) hypertension: Secondary | ICD-10-CM | POA: Diagnosis not present

## 2016-04-12 DIAGNOSIS — D485 Neoplasm of uncertain behavior of skin: Secondary | ICD-10-CM | POA: Diagnosis not present

## 2016-05-15 ENCOUNTER — Other Ambulatory Visit: Payer: Self-pay | Admitting: Pulmonary Disease

## 2016-05-15 DIAGNOSIS — G8929 Other chronic pain: Secondary | ICD-10-CM

## 2016-05-15 DIAGNOSIS — M545 Low back pain: Principal | ICD-10-CM

## 2016-05-18 ENCOUNTER — Ambulatory Visit
Admission: RE | Admit: 2016-05-18 | Discharge: 2016-05-18 | Disposition: A | Discharge status: Home / Self Care | Payer: Medicare Other | Source: Ambulatory Visit | Attending: Pulmonary Disease | Admitting: Pulmonary Disease

## 2016-05-18 ENCOUNTER — Other Ambulatory Visit: Payer: Self-pay | Admitting: Pulmonary Disease

## 2016-05-18 DIAGNOSIS — G8929 Other chronic pain: Secondary | ICD-10-CM

## 2016-05-18 DIAGNOSIS — M545 Low back pain, unspecified: Secondary | ICD-10-CM

## 2016-05-18 MED ORDER — METHYLPREDNISOLONE ACETATE 40 MG/ML INJ SUSP (RADIOLOG
120.0000 mg | Freq: Once | INTRAMUSCULAR | Status: AC
  Administered 2016-05-18: 120 mg via INTRA_ARTICULAR

## 2016-05-18 MED ORDER — IOPAMIDOL (ISOVUE-M 200) INJECTION 41%
1.0000 mL | Freq: Once | INTRAMUSCULAR | Status: AC
  Administered 2016-05-18: 1 mL via INTRA_ARTICULAR

## 2016-05-18 NOTE — Discharge Instructions (Signed)

## 2016-05-24 ENCOUNTER — Other Ambulatory Visit: Payer: Medicare Other

## 2016-05-24 ENCOUNTER — Emergency Department (HOSPITAL_COMMUNITY)
Admission: EM | Admit: 2016-05-24 | Discharge: 2016-05-25 | Disposition: A | Discharge status: Home / Self Care | Payer: Medicare Other | Attending: Emergency Medicine | Admitting: Emergency Medicine

## 2016-05-24 ENCOUNTER — Emergency Department (HOSPITAL_COMMUNITY): Payer: Medicare Other

## 2016-05-24 ENCOUNTER — Encounter (HOSPITAL_COMMUNITY): Payer: Self-pay | Admitting: *Deleted

## 2016-05-24 DIAGNOSIS — Z79899 Other long term (current) drug therapy: Secondary | ICD-10-CM | POA: Insufficient documentation

## 2016-05-24 DIAGNOSIS — Y929 Unspecified place or not applicable: Secondary | ICD-10-CM | POA: Insufficient documentation

## 2016-05-24 DIAGNOSIS — Z7982 Long term (current) use of aspirin: Secondary | ICD-10-CM | POA: Insufficient documentation

## 2016-05-24 DIAGNOSIS — E039 Hypothyroidism, unspecified: Secondary | ICD-10-CM | POA: Insufficient documentation

## 2016-05-24 DIAGNOSIS — Y93E1 Activity, personal bathing and showering: Secondary | ICD-10-CM | POA: Diagnosis not present

## 2016-05-24 DIAGNOSIS — F329 Major depressive disorder, single episode, unspecified: Secondary | ICD-10-CM | POA: Insufficient documentation

## 2016-05-24 DIAGNOSIS — W182XXA Fall in (into) shower or empty bathtub, initial encounter: Secondary | ICD-10-CM | POA: Insufficient documentation

## 2016-05-24 DIAGNOSIS — E785 Hyperlipidemia, unspecified: Secondary | ICD-10-CM | POA: Insufficient documentation

## 2016-05-24 DIAGNOSIS — Y999 Unspecified external cause status: Secondary | ICD-10-CM | POA: Insufficient documentation

## 2016-05-24 DIAGNOSIS — S298XXA Other specified injuries of thorax, initial encounter: Secondary | ICD-10-CM | POA: Diagnosis present

## 2016-05-24 DIAGNOSIS — Z87891 Personal history of nicotine dependence: Secondary | ICD-10-CM | POA: Insufficient documentation

## 2016-05-24 DIAGNOSIS — I10 Essential (primary) hypertension: Secondary | ICD-10-CM | POA: Diagnosis not present

## 2016-05-24 DIAGNOSIS — M1990 Unspecified osteoarthritis, unspecified site: Secondary | ICD-10-CM | POA: Insufficient documentation

## 2016-05-24 DIAGNOSIS — S20212A Contusion of left front wall of thorax, initial encounter: Secondary | ICD-10-CM | POA: Diagnosis not present

## 2016-05-24 DIAGNOSIS — S299XXA Unspecified injury of thorax, initial encounter: Secondary | ICD-10-CM | POA: Diagnosis not present

## 2016-05-24 DIAGNOSIS — E119 Type 2 diabetes mellitus without complications: Secondary | ICD-10-CM | POA: Diagnosis not present

## 2016-05-24 MED ORDER — OXYCODONE-ACETAMINOPHEN 5-325 MG PO TABS
1.0000 | ORAL_TABLET | Freq: Once | ORAL | Status: AC
  Administered 2016-05-25: 1 via ORAL
  Filled 2016-05-24: qty 1

## 2016-05-24 NOTE — ED Provider Notes (Signed)
CSN: [id number]     Arrival date & time 05/24/16  2235 History  By signing my name below, I, Emmanuella Mensah, attest that this documentation has been prepared under the direction and in the presence of Merryl Hacker, MD. Electronically Signed: Judithann Sauger, ED Scribe. 05/24/2016. 11:49 PM.      Chief Complaint  Patient presents with  . Fall   Patient is a 70 y.o. male presenting with fall. The history is provided by the patient. No language interpreter was used.  Fall This is a new problem. The current episode started 1 to 2 hours ago. The problem occurs rarely. Pertinent negatives include no chest pain, no abdominal pain, no headaches and no shortness of breath. Nothing aggravates the symptoms. Nothing relieves the symptoms. He has tried nothing for the symptoms.   HPI Comments: AARIN LANGFITT is a 70 y.o. male with a hx of DM and hypertension who presents to the Emergency Department complaining of ongoing moderate left lateral side pain s/p fall that occurred approx. 1.5 hours ago. He states that he only feels the pain with movement. Pt explains that he was in the shower when he slipped and fell approx 2 feet (he was bending over), hitting his left chest/side on the side of the tub. No LOC or head injuries. Pt states that he is here because he has a CABG and want to be evaluated. No other pain or complaints at this time. No alleviating factors noted. Pt has not tried any medications PTA. He denies any fever, chills, SOB, chest pain, or n/v.    Past Medical History  Diagnosis Date  . Kidney stone   . Type II diabetes mellitus ([place])   . Hypertension   . Hypothyroidism   . Arthritis     "back, hips, legs" (06/10/2015)  . DJD (degenerative joint disease)   . Chronic lower back pain   . Hyperlipidemia 06/11/2015  . Depression   . Chest pain   . Angina, class II [facility])    Past Surgical History  Procedure Laterality Date  . Dupuytren contracture release Bilateral 2000's  .  Colonoscopy  06/21/2011    Procedure: COLONOSCOPY;  Surgeon: Rogene Houston, MD;  [facility];  Service: Endoscopy;  Laterality: N/A;  . Skin cancer excision Left     "forearm"  . Cardiac catheterization N/A 06/11/2015    Procedure: Left Heart Cath and Coronary Angiography;  Surgeon: Belva Crome, MD;  Location: [place] [facility];  Service: Cardiovascular;  Laterality: N/A;  . Coronary artery bypass graft N/A 06/14/2015    Procedure: CORONARY ARTERY BYPASS GRAFTING times four using Left Internal mammary artery and right leg Saphenous vein graft;  Surgeon: Ivin Poot, MD;  Location: [place];  Service: Open Heart Surgery;  Laterality: N/A;  . Tee without cardioversion N/A 06/14/2015    Procedure: TRANSESOPHAGEAL ECHOCARDIOGRAM (TEE);  Surgeon: Ivin Poot, MD;  Location: [facility] [place];  Service: Open Heart Surgery;  Laterality: N/A;  . Hand surgery    . Colonoscopy     Family History  Problem Relation Age of Onset  . Stomach cancer Father   . Stroke Mother   . Hypertension Mother   . Rectal cancer Brother   . Liver cancer Father   . Heart attack Neg Hx   . Cancer Father   . Cancer Brother    Social History  Substance Use Topics  . Smoking status: Former Smoker -- 3.00 packs/day for 47 years  Types: Cigarettes    Quit date: 11/20/1994  . Smokeless tobacco: Current User    Types: Chew  . Alcohol Use: No    Review of Systems  Constitutional: Negative for chills.  Respiratory: Negative for shortness of breath.   Cardiovascular: Negative for chest pain.  Gastrointestinal: Negative for nausea, vomiting and abdominal pain.  Musculoskeletal: Positive for myalgias and arthralgias.  Neurological: Negative for headaches.  All other systems reviewed and are negative.     Allergies  Penicillins  Home Medications   Prior to Admission medications   Medication Sig Start Date End Date Taking? Authorizing Provider  aspirin EC 81 MG tablet Take 81 mg by mouth daily.     Historical Provider, MD  ezetimibe (ZETIA) 10 MG tablet Take 1 tablet (10 mg total) by mouth daily. 08/27/15   Arnoldo Lenis, MD  levothyroxine (SYNTHROID, LEVOTHROID) 50 MCG tablet Take 50 mcg by mouth daily before breakfast.  06/23/15   Historical Provider, MD  metFORMIN (GLUCOPHAGE) 500 MG tablet Take 500 mg by mouth 2 (two) times daily with a meal.      Historical Provider, MD  metoprolol tartrate (LOPRESSOR) 25 MG tablet Take 1 tablet (25 mg total) by mouth 2 (two) times daily. 02/24/16   Arnoldo Lenis, MD  traMADol (ULTRAM) 50 MG tablet Take 1 tablet (50 mg total) by mouth every 6 (six) hours as needed. pain 06/19/15   Erin R Barrett, PA-C   BP 168/78 mmHg  Pulse 78  Temp(Src) 98.2 F (36.8 C) (Oral)  Resp 18  Ht 6' (1.829 m)  Wt 185 lb (83.915 kg)  BMI 25.08 kg/m2  SpO2 100% Physical Exam  Constitutional: He is oriented to person, place, and time. He appears well-developed and well-nourished.  HENT:  Head: Normocephalic and atraumatic.  Cardiovascular: Normal rate, regular rhythm and normal heart sounds.   No murmur heard. Pulmonary/Chest: Effort normal and breath sounds normal. No respiratory distress. He has no wheezes.  Tenderness palpation right lower chest wall over the inferior ribs, no overlying skin changes, no crepitus, midline sternotomy scar well-healed  Abdominal: Soft. Bowel sounds are normal. There is no tenderness. There is no rebound.  No right upper quadrant tenderness to palpation   Musculoskeletal: He exhibits no edema.  Neurological: He is alert and oriented to person, place, and time.  Skin: Skin is warm and dry.  Psychiatric: He has a normal mood and affect.  Nursing note and vitals reviewed.   ED Course  Procedures (including critical care time) DIAGNOSTIC STUDIES: Oxygen Saturation is 100% on RA, normal by my interpretation.    COORDINATION OF CARE: 11:35 PM- Pt advised of plan for treatment and pt agrees. Pt informed of his x-ray results. He  will receive Percocet and incentive spirometry.    Labs Review Labs Reviewed - No data to display  Imaging Review Dg Ribs Unilateral W/chest Left  05/24/2016  CLINICAL DATA:  Status post fall.  Fell in the shower 1 hour ago. EXAM: LEFT RIBS AND CHEST - 3+ VIEW COMPARISON:  None. FINDINGS: No fracture or other bone lesions are seen involving the ribs. There is no evidence of pneumothorax or pleural effusion. Both lungs are clear. Heart size and mediastinal contours are within normal limits. Prior CABG. IMPRESSION: Negative. Electronically Signed   By: Kathreen Devoid   On: 05/24/2016 23:11   Merryl Hacker, MD has personally reviewed and evaluated these images and lab results as part of her medical decision-making.   EKG Interpretation  None      MDM   Final diagnoses:  Chest wall contusion, left, initial encounter    Patient presents following a fall. He reports hitting his left chest on the bathtub after slipping and falling 1-2 feet. Denies any other injury. Vital signs reassuring. ABCs intact. No obvious external signs of trauma. He is tender without crepitus. No abdominal pain. X-ray is negative for fracture. Suspect contusion versus occult rib fracture. He is not on blood thinners. Will given incentive spirometer. He was given pain medication. He takes tramadol at home for degenerative disc disease. Continue pain meds at home as previously prescribed.  After history, exam, and medical workup I feel the patient has been appropriately medically screened and is safe for discharge home. Pertinent diagnoses were discussed with the patient. Patient was given return precautions.  I personally performed the services described in this documentation, which was scribed in my presence. The recorded information has been reviewed and is accurate.    Merryl Hacker, MD 05/25/16 0001

## 2016-05-24 NOTE — Discharge Instructions (Signed)
You were seen today following a fall. There are no obvious rib fractures on her chest x-ray; however, you probably sustained at least internal bruising. It is important for you to take big deep breast. Do the incentive spirometer 3 times a day. User home pain medication for pain control. If you develop shortness of breath, worsening pain or any new or worsening symptoms she needs to be reevaluated.  Blunt Chest Trauma Blunt chest trauma is an injury caused by a blow to the chest. These chest injuries can be very painful. Blunt chest trauma often results in bruised or broken (fractured) ribs. Most cases of bruised and fractured ribs from blunt chest traumas get better after 1 to 3 weeks of rest and pain medicine. Often, the soft tissue in the chest wall is also injured, causing pain and bruising. Internal organs, such as the heart and lungs, may also be injured. Blunt chest trauma can lead to serious medical problems. This injury requires immediate medical care. CAUSES   Motor vehicle collisions.  Falls.  Physical violence.  Sports injuries. SYMPTOMS   Chest pain. The pain may be worse when you move or breathe deeply.  Shortness of breath.  Lightheadedness.  Bruising.  Tenderness.  Swelling. DIAGNOSIS  Your caregiver will do a physical exam. X-rays may be taken to look for fractures. However, minor rib fractures may not show up on X-rays until a few days after the injury. If a more serious injury is suspected, further imaging tests may be done. This may include ultrasounds, computed tomography (CT) scans, or magnetic resonance imaging (MRI). TREATMENT  Treatment depends on the severity of your injury. Your caregiver may prescribe pain medicines and deep breathing exercises. HOME CARE INSTRUCTIONS  Limit your activities until you can move around without much pain.  Do not do any strenuous work until your injury is healed.  Put ice on the injured area.  Put ice in a plastic  bag.  Place a towel between your skin and the bag.  Leave the ice on for 15-20 minutes, 03-04 times a day.  You may wear a rib belt as directed by your caregiver to reduce pain.  Practice deep breathing as directed by your caregiver to keep your lungs clear.  Only take over-the-counter or prescription medicines for pain, fever, or discomfort as directed by your caregiver. SEEK IMMEDIATE MEDICAL CARE IF:   You have increasing pain or shortness of breath.  You cough up blood.  You have nausea, vomiting, or abdominal pain.  You have a fever.  You feel dizzy, weak, or you faint. MAKE SURE YOU:  Understand these instructions.  Will watch your condition.  Will get help right away if you are not doing well or get worse.   This information is not intended to replace advice given to you by your health care provider. Make sure you discuss any questions you have with your health care provider.   Document Released: 12/07/2004 Document Revised: 11/20/2014 Document Reviewed: 04/28/2015 Elsevier Interactive Patient Education [employer].

## 2016-05-24 NOTE — ED Notes (Signed)
Pt states he was in the shower and fell out of bathtub and hit his left side; pt states it hurts to cough

## 2016-06-08 ENCOUNTER — Encounter (INDEPENDENT_AMBULATORY_CARE_PROVIDER_SITE_OTHER): Payer: Self-pay | Admitting: *Deleted

## 2016-10-04 ENCOUNTER — Other Ambulatory Visit: Payer: Self-pay | Admitting: Pulmonary Disease

## 2016-10-04 DIAGNOSIS — G8929 Other chronic pain: Secondary | ICD-10-CM

## 2016-10-04 DIAGNOSIS — M545 Low back pain: Principal | ICD-10-CM

## 2016-10-18 ENCOUNTER — Other Ambulatory Visit: Payer: Self-pay | Admitting: Pulmonary Disease

## 2016-10-18 ENCOUNTER — Ambulatory Visit
Admission: RE | Admit: 2016-10-18 | Discharge: 2016-10-18 | Disposition: A | Discharge status: Home / Self Care | Payer: Medicare Other | Source: Ambulatory Visit | Attending: Pulmonary Disease | Admitting: Pulmonary Disease

## 2016-10-18 DIAGNOSIS — M545 Low back pain: Principal | ICD-10-CM

## 2016-10-18 DIAGNOSIS — G8929 Other chronic pain: Secondary | ICD-10-CM

## 2016-10-18 MED ORDER — IOPAMIDOL (ISOVUE-M 200) INJECTION 41%
1.0000 mL | Freq: Once | INTRAMUSCULAR | Status: AC
  Administered 2016-10-18: 1 mL via EPIDURAL

## 2016-10-18 MED ORDER — METHYLPREDNISOLONE ACETATE 40 MG/ML INJ SUSP (RADIOLOG
120.0000 mg | Freq: Once | INTRAMUSCULAR | Status: AC
  Administered 2016-10-18: 120 mg via EPIDURAL

## 2016-10-18 NOTE — Discharge Instructions (Signed)
Facet Joint Injection Discharge Instructions  1. After your joint injection, use ice to the affected area for the next 24 hours as a temporary increase in pain is not uncommon for a day or two after your procedure.  2. Resume all medications unless otherwise instructed.  3. Common side effects of steroids include facial flushing or redness, restlessness or inability to sleep and an increase in your blood sugar if you are a diabetic.    4. Follow up with the ordering physician for post care.  5. If you have any of the following please call [phone]:      Temperature greater than 101     Pain, redness or swelling at the injection site

## 2016-12-04 ENCOUNTER — Other Ambulatory Visit: Payer: Self-pay

## 2016-12-04 DIAGNOSIS — E785 Hyperlipidemia, unspecified: Secondary | ICD-10-CM

## 2016-12-08 ENCOUNTER — Ambulatory Visit: Payer: Medicare Other | Admitting: Cardiology

## 2016-12-14 ENCOUNTER — Other Ambulatory Visit: Payer: Self-pay | Admitting: "Endocrinology

## 2016-12-14 DIAGNOSIS — E782 Mixed hyperlipidemia: Secondary | ICD-10-CM

## 2016-12-14 DIAGNOSIS — I10 Essential (primary) hypertension: Secondary | ICD-10-CM

## 2016-12-14 DIAGNOSIS — E039 Hypothyroidism, unspecified: Secondary | ICD-10-CM

## 2016-12-14 DIAGNOSIS — E1159 Type 2 diabetes mellitus with other circulatory complications: Secondary | ICD-10-CM

## 2016-12-15 ENCOUNTER — Encounter: Payer: Self-pay | Admitting: Internal Medicine

## 2016-12-25 ENCOUNTER — Other Ambulatory Visit: Payer: Self-pay

## 2016-12-25 DIAGNOSIS — E039 Hypothyroidism, unspecified: Secondary | ICD-10-CM

## 2016-12-25 DIAGNOSIS — E559 Vitamin D deficiency, unspecified: Secondary | ICD-10-CM | POA: Diagnosis not present

## 2016-12-25 DIAGNOSIS — E1159 Type 2 diabetes mellitus with other circulatory complications: Secondary | ICD-10-CM | POA: Diagnosis not present

## 2016-12-26 LAB — COMPREHENSIVE METABOLIC PANEL
ALBUMIN: 4.4 g/dL (ref 3.6–5.1)
ALT: 15 U/L (ref 9–46)
AST: 15 U/L (ref 10–35)
Alkaline Phosphatase: 76 U/L (ref 40–115)
BILIRUBIN TOTAL: 0.6 mg/dL (ref 0.2–1.2)
BUN: 17 mg/dL (ref 7–25)
CALCIUM: 9.3 mg/dL (ref 8.6–10.3)
CO2: 29 mmol/L (ref 20–31)
Chloride: 102 mmol/L (ref 98–110)
Creat: 1.23 mg/dL — ABNORMAL HIGH (ref 0.70–1.18)
Glucose, Bld: 248 mg/dL — ABNORMAL HIGH (ref 65–99)
POTASSIUM: 4.5 mmol/L (ref 3.5–5.3)
Sodium: 140 mmol/L (ref 135–146)
Total Protein: 7 g/dL (ref 6.1–8.1)

## 2016-12-26 LAB — T4, FREE: FREE T4: 1 ng/dL (ref 0.8–1.8)

## 2016-12-26 LAB — TSH: TSH: 3.38 mIU/L (ref 0.40–4.50)

## 2016-12-26 LAB — VITAMIN D 25 HYDROXY (VIT D DEFICIENCY, FRACTURES): VIT D 25 HYDROXY: 19 ng/mL — AB (ref 30–100)

## 2016-12-27 LAB — CBC WITH DIFFERENTIAL/PLATELET
BASOS PCT: 1 %
Basophils Absolute: 73 cells/uL (ref 0–200)
Eosinophils Absolute: 438 cells/uL (ref 15–500)
Eosinophils Relative: 6 %
HEMATOCRIT: 46 % (ref 38.5–50.0)
Hemoglobin: 15.4 g/dL (ref 13.2–17.1)
LYMPHS PCT: 32 %
Lymphs Abs: 2336 cells/uL (ref 850–3900)
MCH: 30.4 pg (ref 27.0–33.0)
MCHC: 33.5 g/dL (ref 32.0–36.0)
MCV: 90.7 fL (ref 80.0–100.0)
MONO ABS: 803 {cells}/uL (ref 200–950)
MONOS PCT: 11 %
MPV: 11.8 fL (ref 7.5–12.5)
Neutro Abs: 3650 cells/uL (ref 1500–7800)
Neutrophils Relative %: 50 %
PLATELETS: 200 10*3/uL (ref 140–400)
RBC: 5.07 MIL/uL (ref 4.20–5.80)
RDW: 14.3 % (ref 11.0–15.0)
WBC: 7.3 10*3/uL (ref 3.8–10.8)

## 2016-12-27 LAB — HEMOGLOBIN A1C
HEMOGLOBIN A1C: 11.2 % — AB (ref <5.7)
Mean Plasma Glucose: 275 mg/dL

## 2016-12-27 LAB — TESTOSTERONE TOTAL,FREE,BIO, MALES
ALBUMIN: 4.5 g/dL (ref 3.6–5.1)
SEX HORMONE BINDING: 53 nmol/L (ref 22–77)
Testosterone, Bioavailable: 83.7 ng/dL (ref 15.0–150.0)
Testosterone, Free: 40.7 pg/mL (ref 6.0–73.0)
Testosterone: 462 ng/dL (ref 250–827)

## 2016-12-27 LAB — MICROALBUMIN / CREATININE URINE RATIO
Creatinine, Urine: 134 mg/dL (ref 20–370)
MICROALB/CREAT RATIO: 25 ug/mg{creat} (ref <30)
Microalb, Ur: 3.4 mg/dL

## 2017-01-04 ENCOUNTER — Encounter: Payer: Self-pay | Admitting: "Endocrinology

## 2017-01-04 ENCOUNTER — Telehealth: Payer: Self-pay

## 2017-01-04 ENCOUNTER — Ambulatory Visit (INDEPENDENT_AMBULATORY_CARE_PROVIDER_SITE_OTHER): Payer: Medicare Other | Admitting: "Endocrinology

## 2017-01-04 VITALS — BP 138/85 | HR 70 | Ht 72.0 in | Wt 188.0 lb

## 2017-01-04 DIAGNOSIS — E039 Hypothyroidism, unspecified: Secondary | ICD-10-CM | POA: Diagnosis not present

## 2017-01-04 DIAGNOSIS — I10 Essential (primary) hypertension: Secondary | ICD-10-CM | POA: Diagnosis not present

## 2017-01-04 DIAGNOSIS — E1159 Type 2 diabetes mellitus with other circulatory complications: Secondary | ICD-10-CM

## 2017-01-04 DIAGNOSIS — E559 Vitamin D deficiency, unspecified: Secondary | ICD-10-CM | POA: Diagnosis not present

## 2017-01-04 DIAGNOSIS — E782 Mixed hyperlipidemia: Secondary | ICD-10-CM

## 2017-01-04 MED ORDER — INSULIN PEN NEEDLE 31G X 8 MM MISC: 1.0000 | 3 refills | Status: DC

## 2017-01-04 MED ORDER — LEVOTHYROXINE SODIUM 75 MCG PO TABS: 75.0000 ug | ORAL_TABLET | Freq: Every day | ORAL | 3 refills | Status: DC

## 2017-01-04 MED ORDER — GLUCOSE BLOOD VI STRP: ORAL_STRIP | 3 refills | Status: DC

## 2017-01-04 MED ORDER — ACCU-CHEK AVIVA DEVI: 0 refills | Status: DC

## 2017-01-04 MED ORDER — INSULIN DEGLUDEC 100 UNIT/ML ~~LOC~~ SOPN: 20.0000 [IU] | PEN_INJECTOR | Freq: Every day | SUBCUTANEOUS | 3 refills | Status: DC

## 2017-01-04 MED ORDER — LANCETS MISC: 1.0000 | 3 refills | Status: DC

## 2017-01-04 MED ORDER — VITAMIN D3 125 MCG (5000 UT) PO CAPS
5000.0000 [IU] | ORAL_CAPSULE | Freq: Every day | ORAL | 0 refills | Status: DC
Start: 2017-01-04 — End: 2017-09-12

## 2017-01-04 MED ORDER — INSULIN GLARGINE 300 UNIT/ML ~~LOC~~ SOPN: 20.0000 [IU] | PEN_INJECTOR | Freq: Every day | SUBCUTANEOUS | 2 refills | Status: DC

## 2017-01-04 NOTE — Telephone Encounter (Signed)
Try Toujeo, if not Lantus

## 2017-01-04 NOTE — Telephone Encounter (Signed)
Pts insurance will not cover [place]. Which insulin would you like to change this to?

## 2017-01-04 NOTE — Patient Instructions (Signed)

## 2017-01-04 NOTE — Progress Notes (Signed)
Subjective:    Patient ID: Hunter Sparks, male    DOB: [birth year],    Past Medical History:  Diagnosis Date  . Angina, class II ([place])   . Arthritis    "back, hips, legs" (06/10/2015)  . Chest pain   . Chronic lower back pain   . Depression   . DJD (degenerative joint disease)   . Hyperlipidemia 06/11/2015  . Hypertension   . Hypothyroidism   . Kidney stone   . Type II diabetes mellitus ([place])    Past Surgical History:  Procedure Laterality Date  . CARDIAC CATHETERIZATION N/A 06/11/2015   Procedure: Left Heart Cath and Coronary Angiography;  Surgeon: Belva Crome, MD;  Location: [facility];  Service: Cardiovascular;  Laterality: N/A;  . COLONOSCOPY  06/21/2011   Procedure: COLONOSCOPY;  Surgeon: Rogene Houston, MD;  Location: [facility];  Service: Endoscopy;  Laterality: N/A;  . COLONOSCOPY    . CORONARY ARTERY BYPASS GRAFT N/A 06/14/2015   Procedure: CORONARY ARTERY BYPASS GRAFTING times four using Left Internal mammary artery and right leg Saphenous vein graft;  Surgeon: Ivin Poot, MD;  Location: [facility];  Service: Open Heart Surgery;  Laterality: N/A;  . 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 HAND SURGERY    . SKIN CANCER EXCISION Left    "forearm"  . TEE WITHOUT CARDIOVERSION N/A 06/14/2015   Procedure: TRANSESOPHAGEAL ECHOCARDIOGRAM (TEE);  Surgeon: Ivin Poot, MD;  Location: [facility];  Service: Open Heart Surgery;  Laterality: N/A;   Social History   Social History  . Marital status: Married    Spouse name: N/A  . Number of children: N/A  . Years of education: N/A   Social History Main Topics  . Smoking status: Former Smoker    Packs/day: 3.00    Years: 47.00    Types: Cigarettes    Quit date: 11/20/1994  . Smokeless tobacco: Current User    Types: Chew  . Alcohol use No  . Drug use: No  . Sexual activity: No   Other Topics Concern  . None   Social History Narrative  . None   Outpatient Encounter Prescriptions as of  01/04/2017  Medication Sig  . aspirin EC 81 MG tablet Take 81 mg by mouth daily.  . Blood Glucose Monitoring Suppl (ACCU-CHEK AVIVA) device Use as instructed  . Cholecalciferol (VITAMIN D3) 5000 units CAPS Take 1 capsule (5,000 Units total) by mouth daily.  Marland Kitchen glucose blood (ACCU-CHEK AVIVA) test strip Use 4 times to test glucose  . insulin degludec (TRESIBA FLEXTOUCH) 100 UNIT/ML SOPN FlexTouch Pen Inject 0.2 mLs (20 Units total) into the skin daily at 10 pm.  . Lancets MISC 1 each by Does not apply route as directed.  Marland Kitchen levothyroxine (SYNTHROID, LEVOTHROID) 75 MCG tablet Take 1 tablet (75 mcg total) by mouth daily before breakfast.  . metoprolol tartrate (LOPRESSOR) 25 MG tablet Take 1 tablet (25 mg total) by mouth 2 (two) times daily.  . traMADol (ULTRAM) 50 MG tablet Take 1 tablet (50 mg total) by mouth every 6 (six) hours as needed. pain  . [DISCONTINUED] ezetimibe (ZETIA) 10 MG tablet Take 1 tablet (10 mg total) by mouth daily.  . [DISCONTINUED] levothyroxine (SYNTHROID, LEVOTHROID) 50 MCG tablet Take 50 mcg by mouth daily before breakfast.   . [DISCONTINUED] metFORMIN (GLUCOPHAGE) 500 MG tablet Take 500 mg by mouth 2 (two) times daily with a meal.     No facility-administered encounter medications on  file as of 01/04/2017.    ALLERGIES: Allergies  Allergen Reactions  . Penicillins Rash   VACCINATION STATUS:  There is no immunization history on file for this patient.  Diabetes  He presents for his follow-up diabetic visit. He has type 2 diabetes mellitus. Onset time: He was diagnosed at approximate age of 63 years. His disease course has been worsening. There are no hypoglycemic associated symptoms. Pertinent negatives for hypoglycemia include no confusion, headaches, pallor or seizures. Associated symptoms include fatigue, polydipsia and polyuria. Pertinent negatives for diabetes include no chest pain, no polyphagia and no weakness. There are no hypoglycemic complications. Symptoms are  worsening. Diabetic complications include heart disease. Risk factors for coronary artery disease include dyslipidemia, diabetes mellitus, hypertension, male sex, sedentary lifestyle and tobacco exposure. Current diabetic treatment includes oral agent (monotherapy). He is compliant with treatment most of the time. His weight is stable. He is following a generally unhealthy diet. He has had a previous visit with a dietitian. He participates in exercise intermittently. (Patient did not bring any meter nor logs to review today. He does not monitor blood glucose regularly. His A1c has increased to 11.2% from 6.8%.)  Hypertension  This is a chronic problem. The current episode started more than 1 year ago. Pertinent negatives include no chest pain, headaches, neck pain, palpitations or shortness of breath. Risk factors for coronary artery disease include diabetes mellitus, dyslipidemia, sedentary lifestyle and smoking/tobacco exposure.  Hyperlipidemia  This is a chronic problem. The current episode started more than 1 year ago. Pertinent negatives include no chest pain, myalgias or shortness of breath.     Review of Systems  Constitutional: Positive for fatigue. Negative for unexpected weight change.  HENT: Negative for dental problem, mouth sores and trouble swallowing.   Eyes: Negative for visual disturbance.  Respiratory: Negative for cough, choking, chest tightness, shortness of breath and wheezing.   Cardiovascular: Negative for chest pain, palpitations and leg swelling.        He recently  underwent quadruple coronary artery bypass graft.  Gastrointestinal: Negative for abdominal distention, abdominal pain, constipation, diarrhea, nausea and vomiting.  Endocrine: Positive for polydipsia and polyuria. Negative for polyphagia.  Genitourinary: Negative for dysuria, flank pain, hematuria and urgency.  Musculoskeletal: Negative for back pain, gait problem, myalgias and neck pain.  Skin: Negative for  pallor, rash and wound.  Neurological: Negative for seizures, syncope, weakness, numbness and headaches.  Psychiatric/Behavioral: Negative.  Negative for confusion and dysphoric mood.    Objective:    BP 138/85   Pulse 70   Ht 6' (1.829 m)   Wt 188 lb (85.3 kg)   BMI 25.50 kg/m   Wt Readings from Last 3 Encounters:  01/04/17 188 lb (85.3 kg)  05/24/16 185 lb (83.9 kg)  02/24/16 183 lb (83 kg)    Physical Exam  Constitutional: He is oriented to person, place, and time. He appears well-developed and well-nourished. He is cooperative. No distress.  HENT:  Head: Normocephalic and atraumatic.  Eyes: EOM are normal.  Neck: Normal range of motion. Neck supple. No tracheal deviation present. No thyromegaly present.  Cardiovascular: Normal rate, S1 normal, S2 normal and normal heart sounds.  Exam reveals no gallop.   No murmur heard. Pulses:      Dorsalis pedis pulses are 1+ on the right side, and 1+ on the left side.       Posterior tibial pulses are 1+ on the right side, and 1+ on the left side.  Healing sternotomy surgical  wound for CABG.  Pulmonary/Chest: Breath sounds normal. No respiratory distress. He has no wheezes.  Abdominal: Soft. Bowel sounds are normal. He exhibits no distension. There is no tenderness. There is no guarding and no CVA tenderness.  Musculoskeletal: He exhibits no edema.       Right shoulder: He exhibits no swelling and no deformity.  Neurological: He is alert and oriented to person, place, and time. He has normal strength and normal reflexes. No cranial nerve deficit or sensory deficit. Gait normal.  Skin: Skin is warm and dry. No rash noted. No cyanosis. Nails show no clubbing.  Psychiatric: He has a normal mood and affect. His speech is normal. Judgment normal. Cognition and memory are normal.    Results for orders placed or performed in visit on 12/25/16  Hemoglobin A1c  Result Value Ref Range   Hgb A1c MFr Bld 11.2 (H) <5.7 %   Mean Plasma Glucose  275 mg/dL  Comprehensive metabolic panel  Result Value Ref Range   Sodium 140 135 - 146 mmol/L   Potassium 4.5 3.5 - 5.3 mmol/L   Chloride 102 98 - 110 mmol/L   CO2 29 20 - 31 mmol/L   Glucose, Bld 248 (H) 65 - 99 mg/dL   BUN 17 7 - 25 mg/dL   Creat 1.23 (H) 0.70 - 1.18 mg/dL   Total Bilirubin 0.6 0.2 - 1.2 mg/dL   Alkaline Phosphatase 76 40 - 115 U/L   AST 15 10 - 35 U/L   ALT 15 9 - 46 U/L   Total Protein 7.0 6.1 - 8.1 g/dL   Albumin 4.4 3.6 - 5.1 g/dL   Calcium 9.3 8.6 - 10.3 mg/dL  TSH  Result Value Ref Range   TSH 3.38 0.40 - 4.50 mIU/L  T4, free  Result Value Ref Range   Free T4 1.0 0.8 - 1.8 ng/dL  Microalbumin / creatinine urine ratio  Result Value Ref Range   Creatinine, Urine 134 20 - 370 mg/dL   Microalb, Ur 3.4 Not estab mg/dL   Microalb Creat Ratio 25 <30 mcg/mg creat  VITAMIN D 25 Hydroxy (Vit-D Deficiency, Fractures)  Result Value Ref Range   Vit D, 25-Hydroxy 19 (L) 30 - 100 ng/mL  CBC with Differential/Platelet  Result Value Ref Range   WBC 7.3 3.8 - 10.8 K/uL   RBC 5.07 4.20 - 5.80 MIL/uL   Hemoglobin 15.4 13.2 - 17.1 g/dL   HCT 46.0 38.5 - 50.0 %   MCV 90.7 80.0 - 100.0 fL   MCH 30.4 27.0 - 33.0 pg   MCHC 33.5 32.0 - 36.0 g/dL   RDW 14.3 11.0 - 15.0 %   Platelets 200 140 - 400 K/uL   MPV 11.8 7.5 - 12.5 fL   Neutro Abs 3,650 1,500 - 7,800 cells/uL   Lymphs Abs 2,336 850 - 3,900 cells/uL   Monocytes Absolute 803 200 - 950 cells/uL   Eosinophils Absolute 438 15 - 500 cells/uL   Basophils Absolute 73 0 - 200 cells/uL   Neutrophils Relative % 50 %   Lymphocytes Relative 32 %   Monocytes Relative 11 %   Eosinophils Relative 6 %   Basophils Relative 1 %   Smear Review Criteria for review not met   Testosterone Total,Free,Bio, Males  Result Value Ref Range   Testosterone 462 250 - 827 ng/dL   Albumin 4.5 3.6 - 5.1 g/dL   Sex Hormone Binding 53 22 - 77 nmol/L   Testosterone, Free 40.7 6.0 - 73.0  pg/mL   Testosterone, Bioavailable 83.7 15.0 -  150.0 ng/dL   Complete Blood Count (Most recent): Lab Results  Component Value Date   WBC 7.3 12/25/2016   HGB 15.4 12/25/2016   HCT 46.0 12/25/2016   MCV 90.7 12/25/2016   PLT 200 12/25/2016   Chemistry (most recent): Lab Results  Component Value Date   NA 140 12/25/2016   K 4.5 12/25/2016   CL 102 12/25/2016   CO2 29 12/25/2016   BUN 17 12/25/2016   CREATININE 1.23 (H) 12/25/2016   Diabetic Labs (most recent): Lab Results  Component Value Date   HGBA1C 11.2 (H) 12/25/2016   HGBA1C 6.7 (A) 08/18/2015   HGBA1C 7.7 (H) 06/10/2015   Lipid Panel     Component Value Date/Time   CHOL 217 (H) 02/18/2016 0834   TRIG 141 02/18/2016 0834   HDL 43 02/18/2016 0834   CHOLHDL 5.0 02/18/2016 0834   VLDL 28 02/18/2016 0834   LDLCALC 146 (H) 02/18/2016 0834     Assessment & Plan:   1. Type 2 diabetes mellitus with vascular disease ([place]), CKD.  His diabetes is  complicated by recent coronary artery disease status post coronary artery bypass graft. - He missed his appointments since October 2016. - Lost control of his diabetes, with A1c increasing to 11.2% from 6.8 %.   - He is not taking any medications for diabetes at this time, metformin and stopped in the interim.  Recent labs reviewed, showing stage 2 renal failure. - Patient remains at a high risk for more acute and chronic complications of diabetes which include CAD, CVA, CKD, retinopathy, and neuropathy. These are all discussed in detail with the patient.  - I have re-counseled the patient on diet management and   by adopting a carbohydrate restricted / protein rich  Diet. - Patient is advised to stick to a routine mealtimes to eat 3 meals  a day and avoid unnecessary snacks ( to snack only to correct hypoglycemia).  - Suggestion is made for patient to avoid simple carbohydrates   from their diet including Cakes , Desserts, Ice Cream,  Soda (  diet and regular) , Sweet Tea , Candies,  Chips, Cookies, Artificial Sweeteners,    and "Sugar-free" Products .  This will help patient to have stable blood glucose profile and potentially avoid unintended  Weight gain.  - The patient  has been  scheduled with Jearld Fenton, RDN, CDE for individualized DM education. - I have approached patient with the following individualized plan to manage diabetes and patient agrees.  - Based on his presentation with significant glycemic burden with A1c of 11.2% and his multiple competitions, he is approached for insulin treatment. - He reluctantly accepts to introduce basal insulin, I'm initiating Tresiba 20 units daily at bedtime. He will likely require higher dose of basal insulin or additional prandial insulin depending on his blood glucose profile and his commitment to monitor for safe use of insulin. - I approached him to start monitoring blood glucose before meals and at bedtime-4 times a day for a week and return with his meter and logs for reevaluation. - Insulin will be the only therapy for his diabetes at this time. - Patient specific target  for A1c; LDL, HDL, Triglycerides, and  Waist Circumference were discussed in detail.  2) BP/HTN: Controlled. Continue current medications. 3) Lipids/HPL: continue Zetia 10 mg by mouth daily at bedtime.   4)  Weight/Diet: CDE consult in progress, exercise, and carbohydrates information provided.  5) Hypothyroidism: He will benefit from slight increase in his levothyroxine. I will prescribe levothyroxine 75 g by mouth every morning.  - We discussed about correct intake of levothyroxine, at fasting, with water, separated by at least 30 minutes from breakfast, and separated by more than 4 hours from calcium, iron, multivitamins, acid reflux medications (PPIs). -Patient is made aware of the fact that thyroid hormone replacement is needed for life, dose to be adjusted by periodic monitoring of thyroid function tests.  6) vitamin D deficiency. I discussed and initiated vitamin D 3 5000 units  daily.  7) Chronic Care/Health Maintenance:  -Patient is encouraged to continue to follow up with Ophthalmology, Podiatrist at least yearly or according to recommendations, and advised to  stay away from smoking. I have recommended yearly flu vaccine and pneumonia vaccination at least every 5 years; moderate intensity exercise for up to 150 minutes weekly; and  sleep for at least 7 hours a day.  I advised patient to maintain close follow up with their PCP for primary care needs.  Patient is asked to bring meter and  blood glucose logs during their next visit.   Follow up plan: Return in about 1 week (around 01/11/2017) for follow up with meter and logs- no labs.  Glade Lloyd, MD Phone: [phone]  Fax: [phone]   01/04/2017, 1:08 PM

## 2017-01-11 ENCOUNTER — Ambulatory Visit: Payer: Medicare Other | Admitting: "Endocrinology

## 2017-01-11 ENCOUNTER — Encounter: Payer: Self-pay | Admitting: "Endocrinology

## 2017-01-11 ENCOUNTER — Ambulatory Visit (INDEPENDENT_AMBULATORY_CARE_PROVIDER_SITE_OTHER): Payer: Medicare Other | Admitting: "Endocrinology

## 2017-01-11 VITALS — BP 133/81 | HR 71 | Ht 72.0 in | Wt 189.0 lb

## 2017-01-11 DIAGNOSIS — IMO0002 Reserved for concepts with insufficient information to code with codable children: Secondary | ICD-10-CM

## 2017-01-11 DIAGNOSIS — E782 Mixed hyperlipidemia: Secondary | ICD-10-CM

## 2017-01-11 DIAGNOSIS — E118 Type 2 diabetes mellitus with unspecified complications: Secondary | ICD-10-CM

## 2017-01-11 DIAGNOSIS — E039 Hypothyroidism, unspecified: Secondary | ICD-10-CM

## 2017-01-11 DIAGNOSIS — I10 Essential (primary) hypertension: Secondary | ICD-10-CM

## 2017-01-11 DIAGNOSIS — E1165 Type 2 diabetes mellitus with hyperglycemia: Secondary | ICD-10-CM

## 2017-01-11 NOTE — Progress Notes (Signed)
Subjective:    Patient ID: Hunter Sparks, male    DOB: May 01, 1946,    Past Medical History:  Diagnosis Date  . Angina, class II ([place])   . Arthritis    "back, hips, legs" (06/10/2015)  . Chest pain   . Chronic lower back pain   . Depression   . DJD (degenerative joint disease)   . Hyperlipidemia 06/11/2015  . Hypertension   . Hypothyroidism   . Kidney stone   . Type II diabetes mellitus ([place])    Past Surgical History:  Procedure Laterality Date  . CARDIAC CATHETERIZATION N/A 06/11/2015   Procedure: Left Heart Cath and Coronary Angiography;  Surgeon: Belva Crome, MD;  Location: [facility];  Service: Cardiovascular;  Laterality: N/A;  . COLONOSCOPY  06/21/2011   Procedure: COLONOSCOPY;  Surgeon: Rogene Houston, MD;  Location: [facility];  Service: Endoscopy;  Laterality: N/A;  . COLONOSCOPY    . CORONARY ARTERY BYPASS GRAFT N/A 06/14/2015   Procedure: CORONARY ARTERY BYPASS GRAFTING times four using Left Internal mammary artery and right leg Saphenous vein graft;  Surgeon: Ivin Poot, MD;  Location: [facility];  Service: Open Heart Surgery;  Laterality: N/A;  . 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 HAND SURGERY    . SKIN CANCER EXCISION Left    "forearm"  . TEE WITHOUT CARDIOVERSION N/A 06/14/2015   Procedure: TRANSESOPHAGEAL ECHOCARDIOGRAM (TEE);  Surgeon: Ivin Poot, MD;  Location: [facility];  Service: Open Heart Surgery;  Laterality: N/A;   Social History   Social History  . Marital status: Married    Spouse name: N/A  . Number of children: N/A  . Years of education: N/A   Social History Main Topics  . Smoking status: Former Smoker    Packs/day: 3.00    Years: 47.00    Types: Cigarettes    Quit date: 11/20/1994  . Smokeless tobacco: Current User    Types: Chew  . Alcohol use No  . Drug use: No  . Sexual activity: No   Other Topics Concern  . None   Social History Narrative  . None   Outpatient Encounter Prescriptions as of 01/11/2017   Medication Sig  . aspirin EC 81 MG tablet Take 81 mg by mouth daily.  . Blood Glucose Monitoring Suppl (ACCU-CHEK AVIVA) device Use as instructed  . Cholecalciferol (VITAMIN D3) 5000 units CAPS Take 1 capsule (5,000 Units total) by mouth daily.  Marland Kitchen glucose blood (ACCU-CHEK AVIVA) test strip Use 4 times to test glucose  . Insulin Glargine (TOUJEO SOLOSTAR) 300 UNIT/ML SOPN Inject 20 Units into the skin at bedtime.  . Insulin Pen Needle (B-D ULTRAFINE III SHORT PEN) 31G X 8 MM MISC 1 each by Does not apply route as directed.  . Lancets MISC 1 each by Does not apply route as directed.  Marland Kitchen levothyroxine (SYNTHROID, LEVOTHROID) 75 MCG tablet Take 1 tablet (75 mcg total) by mouth daily before breakfast.  . metoprolol tartrate (LOPRESSOR) 25 MG tablet Take 1 tablet (25 mg total) by mouth 2 (two) times daily.  . traMADol (ULTRAM) 50 MG tablet Take 1 tablet (50 mg total) by mouth every 6 (six) hours as needed. pain  . [DISCONTINUED] insulin degludec (TRESIBA FLEXTOUCH) 100 UNIT/ML SOPN FlexTouch Pen Inject 0.2 mLs (20 Units total) into the skin daily at 10 pm.   No facility-administered encounter medications on file as of 01/11/2017.    ALLERGIES: Allergies  Allergen Reactions  . Penicillins  Rash   VACCINATION STATUS:  There is no immunization history on file for this patient.  Diabetes  He presents for his follow-up diabetic visit. He has type 2 diabetes mellitus. Onset time: He was diagnosed at approximate age of 70 years. His disease course has been improving. There are no hypoglycemic associated symptoms. Pertinent negatives for hypoglycemia include no confusion, headaches, pallor or seizures. Associated symptoms include fatigue, polydipsia and polyuria. Pertinent negatives for diabetes include no chest pain, no polyphagia and no weakness. There are no hypoglycemic complications. Symptoms are improving. Diabetic complications include heart disease. Risk factors for coronary artery disease include  dyslipidemia, diabetes mellitus, hypertension, male sex, sedentary lifestyle and tobacco exposure. Current diabetic treatment includes oral agent (monotherapy). He is compliant with treatment most of the time. His weight is stable. He is following a generally unhealthy diet. He has had a previous visit with a dietitian. He participates in exercise intermittently. His breakfast blood glucose range is generally 140-180 mg/dl. His lunch blood glucose range is generally 180-200 mg/dl. His dinner blood glucose range is generally 180-200 mg/dl. His overall blood glucose range is 180-200 mg/dl. (Patient did not bring any meter nor logs to review today. He does not monitor blood glucose regularly. His A1c has increased to 11.2% from 6.8%.)  Hypertension  This is a chronic problem. The current episode started more than 1 year ago. Pertinent negatives include no chest pain, headaches, neck pain, palpitations or shortness of breath. Risk factors for coronary artery disease include diabetes mellitus, dyslipidemia, sedentary lifestyle and smoking/tobacco exposure.  Hyperlipidemia  This is a chronic problem. The current episode started more than 1 year ago. Pertinent negatives include no chest pain, myalgias or shortness of breath.     Review of Systems  Constitutional: Positive for fatigue. Negative for unexpected weight change.  HENT: Negative for dental problem, mouth sores and trouble swallowing.   Eyes: Negative for visual disturbance.  Respiratory: Negative for cough, choking, chest tightness, shortness of breath and wheezing.   Cardiovascular: Negative for chest pain, palpitations and leg swelling.        He recently  underwent quadruple coronary artery bypass graft.  Gastrointestinal: Negative for abdominal distention, abdominal pain, constipation, diarrhea, nausea and vomiting.  Endocrine: Positive for polydipsia and polyuria. Negative for polyphagia.  Genitourinary: Negative for dysuria, flank pain,  hematuria and urgency.  Musculoskeletal: Negative for back pain, gait problem, myalgias and neck pain.  Skin: Negative for pallor, rash and wound.  Neurological: Negative for seizures, syncope, weakness, numbness and headaches.  Psychiatric/Behavioral: Negative.  Negative for confusion and dysphoric mood.    Objective:    BP 133/81   Pulse 71   Ht 6' (1.829 m)   Wt 189 lb (85.7 kg)   BMI 25.63 kg/m   Wt Readings from Last 3 Encounters:  01/11/17 189 lb (85.7 kg)  01/04/17 188 lb (85.3 kg)  05/24/16 185 lb (83.9 kg)    Physical Exam  Constitutional: He is oriented to person, place, and time. He appears well-developed and well-nourished. He is cooperative. No distress.  HENT:  Head: Normocephalic and atraumatic.  Eyes: EOM are normal.  Neck: Normal range of motion. Neck supple. No tracheal deviation present. No thyromegaly present.  Cardiovascular: Normal rate, S1 normal, S2 normal and normal heart sounds.  Exam reveals no gallop.   No murmur heard. Pulses:      Dorsalis pedis pulses are 1+ on the right side, and 1+ on the left side.  Posterior tibial pulses are 1+ on the right side, and 1+ on the left side.  Healing sternotomy surgical wound for CABG.  Pulmonary/Chest: Breath sounds normal. No respiratory distress. He has no wheezes.  Abdominal: Soft. Bowel sounds are normal. He exhibits no distension. There is no tenderness. There is no guarding and no CVA tenderness.  Musculoskeletal: He exhibits no edema.       Right shoulder: He exhibits no swelling and no deformity.  Neurological: He is alert and oriented to person, place, and time. He has normal strength and normal reflexes. No cranial nerve deficit or sensory deficit. Gait normal.  Skin: Skin is warm and dry. No rash noted. No cyanosis. Nails show no clubbing.  Psychiatric: He has a normal mood and affect. His speech is normal. Judgment normal. Cognition and memory are normal.    Results for orders placed or  performed in visit on 12/25/16  Hemoglobin A1c  Result Value Ref Range   Hgb A1c MFr Bld 11.2 (H) <5.7 %   Mean Plasma Glucose 275 mg/dL  Comprehensive metabolic panel  Result Value Ref Range   Sodium 140 135 - 146 mmol/L   Potassium 4.5 3.5 - 5.3 mmol/L   Chloride 102 98 - 110 mmol/L   CO2 29 20 - 31 mmol/L   Glucose, Bld 248 (H) 65 - 99 mg/dL   BUN 17 7 - 25 mg/dL   Creat 1.23 (H) 0.70 - 1.18 mg/dL   Total Bilirubin 0.6 0.2 - 1.2 mg/dL   Alkaline Phosphatase 76 40 - 115 U/L   AST 15 10 - 35 U/L   ALT 15 9 - 46 U/L   Total Protein 7.0 6.1 - 8.1 g/dL   Albumin 4.4 3.6 - 5.1 g/dL   Calcium 9.3 8.6 - 10.3 mg/dL  TSH  Result Value Ref Range   TSH 3.38 0.40 - 4.50 mIU/L  T4, free  Result Value Ref Range   Free T4 1.0 0.8 - 1.8 ng/dL  Microalbumin / creatinine urine ratio  Result Value Ref Range   Creatinine, Urine 134 20 - 370 mg/dL   Microalb, Ur 3.4 Not estab mg/dL   Microalb Creat Ratio 25 <30 mcg/mg creat  VITAMIN D 25 Hydroxy (Vit-D Deficiency, Fractures)  Result Value Ref Range   Vit D, 25-Hydroxy 19 (L) 30 - 100 ng/mL  CBC with Differential/Platelet  Result Value Ref Range   WBC 7.3 3.8 - 10.8 K/uL   RBC 5.07 4.20 - 5.80 MIL/uL   Hemoglobin 15.4 13.2 - 17.1 g/dL   HCT 46.0 38.5 - 50.0 %   MCV 90.7 80.0 - 100.0 fL   MCH 30.4 27.0 - 33.0 pg   MCHC 33.5 32.0 - 36.0 g/dL   RDW 14.3 11.0 - 15.0 %   Platelets 200 140 - 400 K/uL   MPV 11.8 7.5 - 12.5 fL   Neutro Abs 3,650 1,500 - 7,800 cells/uL   Lymphs Abs 2,336 850 - 3,900 cells/uL   Monocytes Absolute 803 200 - 950 cells/uL   Eosinophils Absolute 438 15 - 500 cells/uL   Basophils Absolute 73 0 - 200 cells/uL   Neutrophils Relative % 50 %   Lymphocytes Relative 32 %   Monocytes Relative 11 %   Eosinophils Relative 6 %   Basophils Relative 1 %   Smear Review Criteria for review not met   Testosterone Total,Free,Bio, Males  Result Value Ref Range   Testosterone 462 250 - 827 ng/dL   Albumin 4.5 3.6 - 5.1  g/dL    Sex Hormone Binding 53 22 - 77 nmol/L   Testosterone, Free 40.7 6.0 - 73.0 pg/mL   Testosterone, Bioavailable 83.7 15.0 - 150.0 ng/dL   Complete Blood Count (Most recent): Lab Results  Component Value Date   WBC 7.3 12/25/2016   HGB 15.4 12/25/2016   HCT 46.0 12/25/2016   MCV 90.7 12/25/2016   PLT 200 12/25/2016   Chemistry (most recent): Lab Results  Component Value Date   NA 140 12/25/2016   K 4.5 12/25/2016   CL 102 12/25/2016   CO2 29 12/25/2016   BUN 17 12/25/2016   CREATININE 1.23 (H) 12/25/2016   Lipid Panel     Component Value Date/Time   CHOL 217 (H) 02/18/2016 0834   TRIG 141 02/18/2016 0834   HDL 43 02/18/2016 0834   CHOLHDL 5.0 02/18/2016 0834   VLDL 28 02/18/2016 0834   LDLCALC 146 (H) 02/18/2016 0834     Assessment & Plan:   1. Type 2 diabetes mellitus with vascular disease ([place]), CKD.  His diabetes is  complicated by recent coronary artery disease status post coronary artery bypass graft. - He came with much better blood glucose profile averaging 191 over the last 7 days of 29 readings. - recently  Lost control of his diabetes, with A1c increasing to 11.2% from 6.8 %.   - He is not taking any medications for diabetes at this time, metformin and stopped in the interim.  Recent labs reviewed, showing stage 2 renal failure. - Patient remains at a high risk for more acute and chronic complications of diabetes which include CAD, CVA, CKD, retinopathy, and neuropathy. These are all discussed in detail with the patient.  - I have re-counseled the patient on diet management and   by adopting a carbohydrate restricted / protein rich  Diet. - Patient is advised to stick to a routine mealtimes to eat 3 meals  a day and avoid unnecessary snacks ( to snack only to correct hypoglycemia).  - Suggestion is made for patient to avoid simple carbohydrates   from their diet including Cakes , Desserts, Ice Cream,  Soda (  diet and regular) , Sweet Tea , Candies,  Chips,  Cookies, Artificial Sweeteners,   and "Sugar-free" Products .  This will help patient to have stable blood glucose profile and potentially avoid unintended  Weight gain.  - The patient  has been  scheduled with Jearld Fenton, RDN, CDE for individualized DM education. - I have approached patient with the following individualized plan to manage diabetes and patient agrees.  - Based on his presentation with significant glycemic burden with A1c of 11.2% and his multiple complications, he would continue to benefit from insulin treatment. - Based on his presentation, he will not require prandial insulin at this time. - advised him to continue Toujeo any units daily at bedtime associated with monitoring of blood glucose daily before breakfast and at bedtime.  - He is advised to call us if he registers blood glucose below 70 or above 200 x3.  - Insulin will be the only therapy for his diabetes at this time. - Patient specific target  for A1c; LDL, HDL, Triglycerides, and  Waist Circumference were discussed in detail.  2) BP/HTN: Controlled. Continue current medications. 3) Lipids/HPL: continue Zetia 10 mg by mouth daily at bedtime.   4)  Weight/Diet: CDE consult in progress, exercise, and carbohydrates information provided.  5) Hypothyroidism: He will benefit from slight increase in his levothyroxine. I will  prescribe levothyroxine 75 g by mouth every morning.  - We discussed about correct intake of levothyroxine, at fasting, with water, separated by at least 30 minutes from breakfast, and separated by more than 4 hours from calcium, iron, multivitamins, acid reflux medications (PPIs). -Patient is made aware of the fact that thyroid hormone replacement is needed for life, dose to be adjusted by periodic monitoring of thyroid function tests.  6) vitamin D deficiency. I discussed and initiated vitamin D 3 5000 units daily.  7) Chronic Care/Health Maintenance:  -Patient is encouraged to continue to  follow up with Ophthalmology, Podiatrist at least yearly or according to recommendations, and advised to  stay away from smoking. I have recommended yearly flu vaccine and pneumonia vaccination at least every 5 years; moderate intensity exercise for up to 150 minutes weekly; and  sleep for at least 7 hours a day.  I advised patient to maintain close follow up with their PCP for primary care needs.  Patient is asked to bring meter and  blood glucose logs during their next visit.   Follow up plan: Return in about 11 weeks (around 03/29/2017) for follow up with pre-visit labs, meter, and logs.  Glade Lloyd, MD Phone: [phone]  Fax: [phone]   01/11/2017, 3:50 PM

## 2017-02-19 ENCOUNTER — Telehealth: Payer: Self-pay

## 2017-02-19 NOTE — Telephone Encounter (Signed)
Pt wife called to give readings. She wants to know if his nighttime readings are ok or does his medication need to be adjusted?   Date Before breakfast Before lunch Before supper Bedtime  4/6 136   191  4/7 145   217  4/8 121   241  4/9 149       Pt taking:  Tresiba 20 units qhs

## 2017-02-19 NOTE — Telephone Encounter (Signed)
He may increase Tresiba to 25 units daily at bedtime.

## 2017-03-15 DIAGNOSIS — L821 Other seborrheic keratosis: Secondary | ICD-10-CM | POA: Diagnosis not present

## 2017-03-15 DIAGNOSIS — D1801 Hemangioma of skin and subcutaneous tissue: Secondary | ICD-10-CM | POA: Diagnosis not present

## 2017-03-15 DIAGNOSIS — Z85828 Personal history of other malignant neoplasm of skin: Secondary | ICD-10-CM | POA: Diagnosis not present

## 2017-03-15 DIAGNOSIS — L814 Other melanin hyperpigmentation: Secondary | ICD-10-CM | POA: Diagnosis not present

## 2017-03-21 ENCOUNTER — Other Ambulatory Visit: Payer: Self-pay | Admitting: "Endocrinology

## 2017-03-21 DIAGNOSIS — E039 Hypothyroidism, unspecified: Secondary | ICD-10-CM | POA: Diagnosis not present

## 2017-03-21 DIAGNOSIS — E1165 Type 2 diabetes mellitus with hyperglycemia: Secondary | ICD-10-CM | POA: Diagnosis not present

## 2017-03-21 DIAGNOSIS — E118 Type 2 diabetes mellitus with unspecified complications: Secondary | ICD-10-CM | POA: Diagnosis not present

## 2017-03-21 LAB — COMPREHENSIVE METABOLIC PANEL
ALBUMIN: 4.2 g/dL (ref 3.6–5.1)
ALK PHOS: 63 U/L (ref 40–115)
ALT: 11 U/L (ref 9–46)
AST: 16 U/L (ref 10–35)
BUN: 12 mg/dL (ref 7–25)
CALCIUM: 9.1 mg/dL (ref 8.6–10.3)
CO2: 28 mmol/L (ref 20–31)
Chloride: 105 mmol/L (ref 98–110)
Creat: 1.21 mg/dL — ABNORMAL HIGH (ref 0.70–1.18)
Glucose, Bld: 125 mg/dL — ABNORMAL HIGH (ref 65–99)
POTASSIUM: 4 mmol/L (ref 3.5–5.3)
Sodium: 143 mmol/L (ref 135–146)
TOTAL PROTEIN: 6.7 g/dL (ref 6.1–8.1)
Total Bilirubin: 0.6 mg/dL (ref 0.2–1.2)

## 2017-03-21 LAB — T4, FREE: Free T4: 1.2 ng/dL (ref 0.8–1.8)

## 2017-03-21 LAB — TSH: TSH: 1.78 mIU/L (ref 0.40–4.50)

## 2017-03-22 LAB — HEMOGLOBIN A1C
HEMOGLOBIN A1C: 7.8 % — AB (ref <5.7)
MEAN PLASMA GLUCOSE: 177 mg/dL

## 2017-03-23 ENCOUNTER — Other Ambulatory Visit: Payer: Self-pay | Admitting: Cardiology

## 2017-03-29 ENCOUNTER — Ambulatory Visit (INDEPENDENT_AMBULATORY_CARE_PROVIDER_SITE_OTHER): Payer: Medicare Other | Admitting: "Endocrinology

## 2017-03-29 ENCOUNTER — Encounter: Payer: Self-pay | Admitting: "Endocrinology

## 2017-03-29 VITALS — BP 135/80 | HR 90 | Ht 72.0 in | Wt 193.0 lb

## 2017-03-29 DIAGNOSIS — I10 Essential (primary) hypertension: Secondary | ICD-10-CM

## 2017-03-29 DIAGNOSIS — E1159 Type 2 diabetes mellitus with other circulatory complications: Secondary | ICD-10-CM | POA: Diagnosis not present

## 2017-03-29 DIAGNOSIS — E039 Hypothyroidism, unspecified: Secondary | ICD-10-CM | POA: Diagnosis not present

## 2017-03-29 DIAGNOSIS — E782 Mixed hyperlipidemia: Secondary | ICD-10-CM | POA: Diagnosis not present

## 2017-03-29 NOTE — Progress Notes (Signed)
Subjective:    Patient ID: Hunter Sparks, male    DOB: 1946-05-05,    Past Medical History:  Diagnosis Date  . Angina, class II ([place])   . Arthritis    "back, hips, legs" (06/10/2015)  . Chest pain   . Chronic lower back pain   . Depression   . DJD (degenerative joint disease)   . Hyperlipidemia 06/11/2015  . Hypertension   . Hypothyroidism   . Kidney stone   . Type II diabetes mellitus ([place])    Past Surgical History:  Procedure Laterality Date  . CARDIAC CATHETERIZATION N/A 06/11/2015   Procedure: Left Heart Cath and Coronary Angiography;  Surgeon: Belva Crome, MD;  Location: [facility];  Service: Cardiovascular;  Laterality: N/A;  . COLONOSCOPY  06/21/2011   Procedure: COLONOSCOPY;  Surgeon: Rogene Houston, MD;  Location: [facility];  Service: Endoscopy;  Laterality: N/A;  . COLONOSCOPY    . CORONARY ARTERY BYPASS GRAFT N/A 06/14/2015   Procedure: CORONARY ARTERY BYPASS GRAFTING times four using Left Internal mammary artery and right leg Saphenous vein graft;  Surgeon: Ivin Poot, MD;  Location: [facility];  Service: Open Heart Surgery;  Laterality: N/A;  . 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 HAND SURGERY    . SKIN CANCER EXCISION Left    "forearm"  . TEE WITHOUT CARDIOVERSION N/A 06/14/2015   Procedure: TRANSESOPHAGEAL ECHOCARDIOGRAM (TEE);  Surgeon: Ivin Poot, MD;  Location: [facility];  Service: Open Heart Surgery;  Laterality: N/A;   Social History   Social History  . Marital status: Married    Spouse name: N/A  . Number of children: N/A  . Years of education: N/A   Social History Main Topics  . Smoking status: Former Smoker    Packs/day: 3.00    Years: 47.00    Types: Cigarettes    Quit date: 11/20/1994  . Smokeless tobacco: Current User    Types: Chew  . Alcohol use No  . Drug use: No  . Sexual activity: No   Other Topics Concern  . None   Social History Narrative  . None   Outpatient Encounter Prescriptions as of  03/29/2017  Medication Sig  . aspirin EC 81 MG tablet Take 81 mg by mouth daily.  . Blood Glucose Monitoring Suppl (ACCU-CHEK AVIVA) device Use as instructed  . Cholecalciferol (VITAMIN D3) 5000 units CAPS Take 1 capsule (5,000 Units total) by mouth daily.  Marland Kitchen glucose blood (ACCU-CHEK AVIVA) test strip Use 4 times to test glucose  . Insulin Glargine (TOUJEO SOLOSTAR) 300 UNIT/ML SOPN Inject 20 Units into the skin at bedtime.  . Insulin Pen Needle (B-D ULTRAFINE III SHORT PEN) 31G X 8 MM MISC 1 each by Does not apply route as directed.  . Lancets MISC 1 each by Does not apply route as directed.  Marland Kitchen levothyroxine (SYNTHROID, LEVOTHROID) 75 MCG tablet Take 1 tablet (75 mcg total) by mouth daily before breakfast.  . metoprolol tartrate (LOPRESSOR) 25 MG tablet TAKE ONE TABLET BY MOUTH TWICE DAILY  . traMADol (ULTRAM) 50 MG tablet Take 1 tablet (50 mg total) by mouth every 6 (six) hours as needed. pain   No facility-administered encounter medications on file as of 03/29/2017.    ALLERGIES: Allergies  Allergen Reactions  . Penicillins Rash   VACCINATION STATUS:  There is no immunization history on file for this patient.  Diabetes  He presents for his follow-up diabetic visit. He has type 2  diabetes mellitus. Onset time: He was diagnosed at approximate age of 89 years. His disease course has been improving. There are no hypoglycemic associated symptoms. Pertinent negatives for hypoglycemia include no confusion, headaches, pallor or seizures. Associated symptoms include fatigue, polydipsia and polyuria. Pertinent negatives for diabetes include no chest pain, no polyphagia and no weakness. There are no hypoglycemic complications. Symptoms are improving. Diabetic complications include heart disease. Risk factors for coronary artery disease include dyslipidemia, diabetes mellitus, hypertension, male sex, sedentary lifestyle and tobacco exposure. Current diabetic treatment includes oral agent (monotherapy).  He is compliant with treatment most of the time. His weight is increasing steadily. He is following a generally unhealthy diet. He has had a previous visit with a dietitian. He participates in exercise intermittently. His breakfast blood glucose range is generally 140-180 mg/dl. His dinner blood glucose range is generally 140-180 mg/dl. His overall blood glucose range is 140-180 mg/dl. (Patient did not bring any meter nor logs to review today. He does not monitor blood glucose regularly. His A1c has increased to 11.2% from 6.8%.)  Hypertension  This is a chronic problem. The current episode started more than 1 year ago. Pertinent negatives include no chest pain, headaches, neck pain, palpitations or shortness of breath. Risk factors for coronary artery disease include diabetes mellitus, dyslipidemia, sedentary lifestyle and smoking/tobacco exposure.  Hyperlipidemia  This is a chronic problem. The current episode started more than 1 year ago. Pertinent negatives include no chest pain, myalgias or shortness of breath.     Review of Systems  Constitutional: Positive for fatigue. Negative for unexpected weight change.  HENT: Negative for dental problem, mouth sores and trouble swallowing.   Eyes: Negative for visual disturbance.  Respiratory: Negative for cough, choking, chest tightness, shortness of breath and wheezing.   Cardiovascular: Negative for chest pain, palpitations and leg swelling.        He recently  underwent quadruple coronary artery bypass graft.  Gastrointestinal: Negative for abdominal distention, abdominal pain, constipation, diarrhea, nausea and vomiting.  Endocrine: Positive for polydipsia and polyuria. Negative for polyphagia.  Genitourinary: Negative for dysuria, flank pain, hematuria and urgency.  Musculoskeletal: Negative for back pain, gait problem, myalgias and neck pain.  Skin: Negative for pallor, rash and wound.  Neurological: Negative for seizures, syncope, weakness,  numbness and headaches.  Psychiatric/Behavioral: Negative.  Negative for confusion and dysphoric mood.    Objective:    BP 135/80   Pulse 90   Ht 6' (1.829 m)   Wt 193 lb (87.5 kg)   BMI 26.18 kg/m   Wt Readings from Last 3 Encounters:  03/29/17 193 lb (87.5 kg)  01/11/17 189 lb (85.7 kg)  01/04/17 188 lb (85.3 kg)    Physical Exam  Constitutional: He is oriented to person, place, and time. He appears well-developed and well-nourished. He is cooperative. No distress.  HENT:  Head: Normocephalic and atraumatic.  Eyes: EOM are normal.  Neck: Normal range of motion. Neck supple. No tracheal deviation present. No thyromegaly present.  Cardiovascular: Normal rate, S1 normal, S2 normal and normal heart sounds.  Exam reveals no gallop.   No murmur heard. Pulses:      Dorsalis pedis pulses are 1+ on the right side, and 1+ on the left side.       Posterior tibial pulses are 1+ on the right side, and 1+ on the left side.  Healed sternotomy surgical wound for CABG.  Pulmonary/Chest: Breath sounds normal. No respiratory distress. He has no wheezes.  Abdominal: Soft.  Bowel sounds are normal. He exhibits no distension. There is no tenderness. There is no guarding and no CVA tenderness.  Musculoskeletal: He exhibits no edema.       Right shoulder: He exhibits no swelling and no deformity.  Neurological: He is alert and oriented to person, place, and time. He has normal strength and normal reflexes. No cranial nerve deficit or sensory deficit. Gait normal.  Skin: Skin is warm and dry. No rash noted. No cyanosis. Nails show no clubbing.  Psychiatric: He has a normal mood and affect. His speech is normal. Judgment normal. Cognition and memory are normal.    Results for orders placed or performed in visit on 03/21/17  Comprehensive metabolic panel  Result Value Ref Range   Sodium 143 135 - 146 mmol/L   Potassium 4.0 3.5 - 5.3 mmol/L   Chloride 105 98 - 110 mmol/L   CO2 28 20 - 31 mmol/L    Glucose, Bld 125 (H) 65 - 99 mg/dL   BUN 12 7 - 25 mg/dL   Creat 1.21 (H) 0.70 - 1.18 mg/dL   Total Bilirubin 0.6 0.2 - 1.2 mg/dL   Alkaline Phosphatase 63 40 - 115 U/L   AST 16 10 - 35 U/L   ALT 11 9 - 46 U/L   Total Protein 6.7 6.1 - 8.1 g/dL   Albumin 4.2 3.6 - 5.1 g/dL   Calcium 9.1 8.6 - 10.3 mg/dL  TSH  Result Value Ref Range   TSH 1.78 0.40 - 4.50 mIU/L  T4, free  Result Value Ref Range   Free T4 1.2 0.8 - 1.8 ng/dL  Hemoglobin A1c  Result Value Ref Range   Hgb A1c MFr Bld 7.8 (H) <5.7 %   Mean Plasma Glucose 177 mg/dL   Complete Blood Count (Most recent): Lab Results  Component Value Date   WBC 7.3 12/25/2016   HGB 15.4 12/25/2016   HCT 46.0 12/25/2016   MCV 90.7 12/25/2016   PLT 200 12/25/2016   Chemistry (most recent): Lab Results  Component Value Date   NA 143 03/21/2017   K 4.0 03/21/2017   CL 105 03/21/2017   CO2 28 03/21/2017   BUN 12 03/21/2017   CREATININE 1.21 (H) 03/21/2017   Lipid Panel     Component Value Date/Time   CHOL 217 (H) 02/18/2016 0834   TRIG 141 02/18/2016 0834   HDL 43 02/18/2016 0834   CHOLHDL 5.0 02/18/2016 0834   VLDL 28 02/18/2016 0834   LDLCALC 146 (H) 02/18/2016 0834     Assessment & Plan:   1. Type 2 diabetes mellitus with vascular disease ([place]), CKD.  His diabetes is  complicated by recent coronary artery disease status post coronary artery bypass graft. - He came with much better blood glucose profile averaging  171 over the last 7 days of 14 readings. -  He has improved his A1c to 7.8% from 11.2%.    Recent labs reviewed, showing stage 2 renal failure. - Patient remains at a high risk for more acute and chronic complications of diabetes which include CAD, CVA, CKD, retinopathy, and neuropathy. These are all discussed in detail with the patient.  - I have re-counseled the patient on diet management and   by adopting a carbohydrate restricted / protein rich  Diet. - Patient is advised to stick to a routine  mealtimes to eat 3 meals  a day and avoid unnecessary snacks ( to snack only to correct hypoglycemia).  - Suggestion is made for  patient to avoid simple carbohydrates   from their diet including Cakes , Desserts, Ice Cream,  Soda (  diet and regular) , Sweet Tea , Candies,  Chips, Cookies, Artificial Sweeteners,   and "Sugar-free" Products .  This will help patient to have stable blood glucose profile and potentially avoid unintended  Weight gain.  - The patient  has been  scheduled with Jearld Fenton, RDN, CDE for individualized DM education. - I have approached patient with the following individualized plan to manage diabetes and patient agrees.  - Based on his presentation with significant  improvement , and due to the fact that he does not tolerate metformin, I advised him to stay on basal insulin. -He will not require prandial insulin at this time. - I advised him to continue Toujeo 20 units daily at bedtime associated with monitoring of blood glucose daily before breakfast and at bedtime.  - He is advised to call us if he registers blood glucose below 70 or above 200 x3.  - Insulin will be the only therapy for his diabetes at this time. - Patient specific target  for A1c; LDL, HDL, Triglycerides, and  Waist Circumference were discussed in detail.  2) BP/HTN: Controlled. Continue current medications. 3) Lipids/HPL: continue Zetia 10 mg by mouth daily at bedtime.   4)  Weight/Diet: CDE consult in progress, exercise, and carbohydrates information provided.  5) Hypothyroidism:  - His labs are consistent with appropriate replacement. I will continue Levothyroxine 75 g by mouth every morning.  - We discussed about correct intake of levothyroxine, at fasting, with water, separated by at least 30 minutes from breakfast, and separated by more than 4 hours from calcium, iron, multivitamins, acid reflux medications (PPIs). -Patient is made aware of the fact that thyroid hormone replacement is  needed for life, dose to be adjusted by periodic monitoring of thyroid function tests.  6) vitamin D deficiency. I discussed and initiated vitamin D 3 5000 units daily.  7) Chronic Care/Health Maintenance:  -Patient is encouraged to continue to follow up with Ophthalmology, Podiatrist at least yearly or according to recommendations, and advised to  stay away from smoking. I have recommended yearly flu vaccine and pneumonia vaccination at least every 5 years; moderate intensity exercise for up to 150 minutes weekly; and  sleep for at least 7 hours a day.  I advised patient to maintain close follow up with their PCP for primary care needs.  Patient is asked to bring meter and  blood glucose logs during their next visit.   Follow up plan: Return in about 3 months (around 06/29/2017) for follow up with pre-visit labs, meter, and logs.  Glade Lloyd, MD Phone: [phone]  Fax: [phone]   03/29/2017, 9:46 AM

## 2017-04-11 ENCOUNTER — Other Ambulatory Visit: Payer: Self-pay | Admitting: Pulmonary Disease

## 2017-04-11 DIAGNOSIS — M545 Low back pain, unspecified: Secondary | ICD-10-CM

## 2017-04-12 ENCOUNTER — Ambulatory Visit
Admission: RE | Admit: 2017-04-12 | Discharge: 2017-04-12 | Disposition: A | Discharge status: Home / Self Care | Payer: Medicare Other | Source: Ambulatory Visit | Attending: Pulmonary Disease | Admitting: Pulmonary Disease

## 2017-04-12 DIAGNOSIS — M47817 Spondylosis without myelopathy or radiculopathy, lumbosacral region: Secondary | ICD-10-CM | POA: Diagnosis not present

## 2017-04-12 DIAGNOSIS — M545 Low back pain, unspecified: Secondary | ICD-10-CM

## 2017-04-12 MED ORDER — IOPAMIDOL (ISOVUE-M 200) INJECTION 41%
1.0000 mL | Freq: Once | INTRAMUSCULAR | Status: AC
  Administered 2017-04-12: 1 mL via INTRA_ARTICULAR

## 2017-04-12 MED ORDER — METHYLPREDNISOLONE ACETATE 40 MG/ML INJ SUSP (RADIOLOG
120.0000 mg | Freq: Once | INTRAMUSCULAR | Status: AC
  Administered 2017-04-12: 120 mg via INTRA_ARTICULAR

## 2017-04-21 ENCOUNTER — Other Ambulatory Visit: Payer: Self-pay | Admitting: "Endocrinology

## 2017-06-13 ENCOUNTER — Other Ambulatory Visit (INDEPENDENT_AMBULATORY_CARE_PROVIDER_SITE_OTHER): Payer: Self-pay | Admitting: *Deleted

## 2017-06-13 DIAGNOSIS — Z800 Family history of malignant neoplasm of digestive organs: Secondary | ICD-10-CM

## 2017-06-13 DIAGNOSIS — Z86010 Personal history of colonic polyps: Secondary | ICD-10-CM | POA: Insufficient documentation

## 2017-06-25 ENCOUNTER — Other Ambulatory Visit: Payer: Self-pay | Admitting: "Endocrinology

## 2017-06-25 DIAGNOSIS — E1165 Type 2 diabetes mellitus with hyperglycemia: Secondary | ICD-10-CM | POA: Diagnosis not present

## 2017-06-25 DIAGNOSIS — E118 Type 2 diabetes mellitus with unspecified complications: Secondary | ICD-10-CM | POA: Diagnosis not present

## 2017-06-25 LAB — COMPREHENSIVE METABOLIC PANEL
ALT: 10 U/L (ref 9–46)
AST: 13 U/L (ref 10–35)
Albumin: 4.3 g/dL (ref 3.6–5.1)
Alkaline Phosphatase: 80 U/L (ref 40–115)
BUN: 16 mg/dL (ref 7–25)
CO2: 27 mmol/L (ref 20–32)
CREATININE: 1.31 mg/dL — AB (ref 0.70–1.18)
Calcium: 9.5 mg/dL (ref 8.6–10.3)
Chloride: 103 mmol/L (ref 98–110)
GLUCOSE: 184 mg/dL — AB (ref 65–99)
POTASSIUM: 4.1 mmol/L (ref 3.5–5.3)
SODIUM: 140 mmol/L (ref 135–146)
Total Bilirubin: 0.7 mg/dL (ref 0.2–1.2)
Total Protein: 7.1 g/dL (ref 6.1–8.1)

## 2017-06-26 LAB — HEMOGLOBIN A1C
Hgb A1c MFr Bld: 9.2 % — ABNORMAL HIGH (ref <5.7)
Mean Plasma Glucose: 217 mg/dL

## 2017-07-03 ENCOUNTER — Ambulatory Visit (INDEPENDENT_AMBULATORY_CARE_PROVIDER_SITE_OTHER): Payer: Medicare Other | Admitting: "Endocrinology

## 2017-07-03 ENCOUNTER — Encounter: Payer: Self-pay | Admitting: "Endocrinology

## 2017-07-03 VITALS — BP 136/83 | HR 67 | Ht 74.0 in | Wt 191.0 lb

## 2017-07-03 DIAGNOSIS — E782 Mixed hyperlipidemia: Secondary | ICD-10-CM | POA: Diagnosis not present

## 2017-07-03 DIAGNOSIS — E039 Hypothyroidism, unspecified: Secondary | ICD-10-CM

## 2017-07-03 DIAGNOSIS — E1159 Type 2 diabetes mellitus with other circulatory complications: Secondary | ICD-10-CM

## 2017-07-03 DIAGNOSIS — I10 Essential (primary) hypertension: Secondary | ICD-10-CM | POA: Diagnosis not present

## 2017-07-03 DIAGNOSIS — E559 Vitamin D deficiency, unspecified: Secondary | ICD-10-CM

## 2017-07-03 MED ORDER — INSULIN GLARGINE 300 UNIT/ML ~~LOC~~ SOPN: 30.0000 [IU] | PEN_INJECTOR | Freq: Every day | SUBCUTANEOUS | 2 refills | Status: DC

## 2017-07-03 MED ORDER — SITAGLIPTIN PHOSPHATE 25 MG PO TABS: 25.0000 mg | ORAL_TABLET | Freq: Every day | ORAL | 3 refills | Status: DC

## 2017-07-03 NOTE — Progress Notes (Signed)
Subjective:    Patient ID: Hunter Sparks, male    DOB: [birth year],    Past Medical History:  Diagnosis Date  . Angina, class II ([place])   . Arthritis    "back, hips, legs" (06/10/2015)  . Chest pain   . Chronic lower back pain   . Depression   . DJD (degenerative joint disease)   . Hyperlipidemia 06/11/2015  . Hypertension   . Hypothyroidism   . Kidney stone   . Type II diabetes mellitus ([place])    Past Surgical History:  Procedure Laterality Date  . CARDIAC CATHETERIZATION N/A 06/11/2015   Procedure: Left Heart Cath and Coronary Angiography;  Surgeon: Belva Crome, MD;  Location: [facility];  Service: Cardiovascular;  Laterality: N/A;  . COLONOSCOPY  06/21/2011   Procedure: COLONOSCOPY;  Surgeon: Rogene Houston, MD;  [facility];  Service: Endoscopy;  Laterality: N/A;  . COLONOSCOPY    . CORONARY ARTERY BYPASS GRAFT N/A 06/14/2015   Procedure: CORONARY ARTERY BYPASS GRAFTING times four using Left Internal mammary artery and right leg Saphenous vein graft;  Surgeon: Ivin Poot, MD;  Location: [facility];  Service: Open Heart Surgery;  Laterality: N/A;  . 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 HAND SURGERY    . SKIN CANCER EXCISION Left    "forearm"  . TEE WITHOUT CARDIOVERSION N/A 06/14/2015   Procedure: TRANSESOPHAGEAL ECHOCARDIOGRAM (TEE);  Surgeon: Ivin Poot, MD;  Location: [facility];  Service: Open Heart Surgery;  Laterality: N/A;   Social History   Social History  . Marital status: Married    Spouse name: N/A  . Number of children: N/A  . Years of education: N/A   Social History Main Topics  . Smoking status: Former Smoker    Packs/day: 3.00    Years: 47.00    Types: Cigarettes    Quit date: 11/20/1994  . Smokeless tobacco: Current User    Types: Chew  . Alcohol use No  . Drug use: No  . Sexual activity: No   Other Topics Concern  . None   Social History Narrative  . None   Outpatient Encounter Prescriptions as of  07/03/2017  Medication Sig  . aspirin EC 81 MG tablet Take 81 mg by mouth daily.  . Blood Glucose Monitoring Suppl (ACCU-CHEK AVIVA) device Use as instructed  . Cholecalciferol (VITAMIN D3) 5000 units CAPS Take 1 capsule (5,000 Units total) by mouth daily.  Marland Kitchen glucose blood (ACCU-CHEK AVIVA) test strip Use 4 times to test glucose  . Insulin Glargine (TOUJEO SOLOSTAR) 300 UNIT/ML SOPN Inject 30 Units into the skin at bedtime.  . Insulin Pen Needle (B-D ULTRAFINE III SHORT PEN) 31G X 8 MM MISC 1 each by Does not apply route as directed.  . Lancets MISC 1 each by Does not apply route as directed.  Marland Kitchen levothyroxine (SYNTHROID, LEVOTHROID) 75 MCG tablet TAKE 1 TABLET BY MOUTH ONCE DAILY BEFORE BREAKFAST  . metoprolol tartrate (LOPRESSOR) 25 MG tablet TAKE ONE TABLET BY MOUTH TWICE DAILY  . sitaGLIPtin (JANUVIA) 25 MG tablet Take 1 tablet (25 mg total) by mouth daily.  . traMADol (ULTRAM) 50 MG tablet Take 1 tablet (50 mg total) by mouth every 6 (six) hours as needed. pain  . [DISCONTINUED] Insulin Glargine (TOUJEO SOLOSTAR) 300 UNIT/ML SOPN Inject 20 Units into the skin at bedtime.   No facility-administered encounter medications on file as of 07/03/2017.    ALLERGIES: Allergies  Allergen Reactions  .  Penicillins Rash   VACCINATION STATUS:  There is no immunization history on file for this patient.  Diabetes  He presents for his follow-up diabetic visit. He has type 2 diabetes mellitus. Onset time: He was diagnosed at approximate age of 42 years. His disease course has been worsening. There are no hypoglycemic associated symptoms. Pertinent negatives for hypoglycemia include no confusion, headaches, pallor or seizures. Associated symptoms include fatigue. Pertinent negatives for diabetes include no chest pain, no polydipsia, no polyphagia, no polyuria and no weakness. There are no hypoglycemic complications. Symptoms are worsening. Diabetic complications include heart disease. Risk factors for  coronary artery disease include dyslipidemia, diabetes mellitus, hypertension, male sex, sedentary lifestyle and tobacco exposure. Current diabetic treatment includes oral agent (monotherapy). He is compliant with treatment most of the time. His weight is increasing steadily. He is following a generally unhealthy diet. He has had a previous visit with a dietitian. He participates in exercise intermittently. His breakfast blood glucose range is generally 140-180 mg/dl. His dinner blood glucose range is generally >200 mg/dl. His overall blood glucose range is >200 mg/dl. (Patient did not bring any meter nor logs to review today. He does not monitor blood glucose regularly. His A1c has increased to 11.2% from 6.8%.)  Hypertension  This is a chronic problem. The current episode started more than 1 year ago. Pertinent negatives include no chest pain, headaches, neck pain, palpitations or shortness of breath. Risk factors for coronary artery disease include diabetes mellitus, dyslipidemia, sedentary lifestyle and smoking/tobacco exposure.  Hyperlipidemia  This is a chronic problem. The current episode started more than 1 year ago. Pertinent negatives include no chest pain, myalgias or shortness of breath.    Review of Systems  Constitutional: Positive for fatigue. Negative for unexpected weight change.  HENT: Negative for dental problem, mouth sores and trouble swallowing.   Eyes: Negative for visual disturbance.  Respiratory: Negative for cough, choking, chest tightness, shortness of breath and wheezing.   Cardiovascular: Negative for chest pain, palpitations and leg swelling.        He recently  underwent quadruple coronary artery bypass graft.  Gastrointestinal: Negative for abdominal distention, abdominal pain, constipation, diarrhea, nausea and vomiting.  Endocrine: Negative for polydipsia, polyphagia and polyuria.  Genitourinary: Negative for dysuria, flank pain, hematuria and urgency.   Musculoskeletal: Negative for back pain, gait problem, myalgias and neck pain.  Skin: Negative for pallor, rash and wound.  Neurological: Negative for seizures, syncope, weakness, numbness and headaches.  Psychiatric/Behavioral: Negative.  Negative for confusion and dysphoric mood.    Objective:    BP 136/83   Pulse 67   Ht 6\' 2"  (1.88 m)   Wt 191 lb (86.6 kg)   BMI 24.52 kg/m   Wt Readings from Last 3 Encounters:  07/03/17 191 lb (86.6 kg)  03/29/17 193 lb (87.5 kg)  01/11/17 189 lb (85.7 kg)    Physical Exam  Constitutional: He is oriented to person, place, and time. He appears well-developed and well-nourished. He is cooperative. No distress.  HENT:  Head: Normocephalic and atraumatic.  Eyes: EOM are normal.  Neck: Normal range of motion. Neck supple. No tracheal deviation present. No thyromegaly present.  Cardiovascular: Normal rate, S1 normal, S2 normal and normal heart sounds.  Exam reveals no gallop.   No murmur heard. Pulses:      Dorsalis pedis pulses are 1+ on the right side, and 1+ on the left side.       Posterior tibial pulses are 1+ on the  right side, and 1+ on the left side.  Healed sternotomy surgical wound for CABG.  Pulmonary/Chest: Breath sounds normal. No respiratory distress. He has no wheezes.  Abdominal: Soft. Bowel sounds are normal. He exhibits no distension. There is no tenderness. There is no guarding and no CVA tenderness.  Musculoskeletal: He exhibits no edema.       Right shoulder: He exhibits no swelling and no deformity.  Neurological: He is alert and oriented to person, place, and time. He has normal strength and normal reflexes. No cranial nerve deficit or sensory deficit. Gait normal.  Skin: Skin is warm and dry. No rash noted. No cyanosis. Nails show no clubbing.  Psychiatric: He has a normal mood and affect. His speech is normal. Judgment normal. Cognition and memory are normal.    Results for orders placed or performed in visit on  06/25/17  Comprehensive metabolic panel  Result Value Ref Range   Sodium 140 135 - 146 mmol/L   Potassium 4.1 3.5 - 5.3 mmol/L   Chloride 103 98 - 110 mmol/L   CO2 27 20 - 32 mmol/L   Glucose, Bld 184 (H) 65 - 99 mg/dL   BUN 16 7 - 25 mg/dL   Creat 1.31 (H) 0.70 - 1.18 mg/dL   Total Bilirubin 0.7 0.2 - 1.2 mg/dL   Alkaline Phosphatase 80 40 - 115 U/L   AST 13 10 - 35 U/L   ALT 10 9 - 46 U/L   Total Protein 7.1 6.1 - 8.1 g/dL   Albumin 4.3 3.6 - 5.1 g/dL   Calcium 9.5 8.6 - 10.3 mg/dL  Hemoglobin A1c  Result Value Ref Range   Hgb A1c MFr Bld 9.2 (H) <5.7 %   Mean Plasma Glucose 217 mg/dL   Complete Blood Count (Most recent): Lab Results  Component Value Date   WBC 7.3 12/25/2016   HGB 15.4 12/25/2016   HCT 46.0 12/25/2016   MCV 90.7 12/25/2016   PLT 200 12/25/2016   Chemistry (most recent): Lab Results  Component Value Date   NA 140 06/25/2017   K 4.1 06/25/2017   CL 103 06/25/2017   CO2 27 06/25/2017   BUN 16 06/25/2017   CREATININE 1.31 (H) 06/25/2017   Lipid Panel     Component Value Date/Time   CHOL 217 (H) 02/18/2016 0834   TRIG 141 02/18/2016 0834   HDL 43 02/18/2016 0834   CHOLHDL 5.0 02/18/2016 0834   VLDL 28 02/18/2016 0834   LDLCALC 146 (H) 02/18/2016 0834     Assessment & Plan:   1. Type 2 diabetes mellitus with vascular disease ([place]), CKD.  His diabetes is  complicated by recent coronary artery disease status post coronary artery bypass graft. - He came with above target blood glucose profile averaging  201 over the last 90 days of 167 readings. -  His A1c has increased to 9.7% from 7.8%.    Recent labs reviewed, showing stage 2 renal failure. - Patient remains at a high risk for more acute and chronic complications of diabetes which include CAD, CVA, CKD, retinopathy, and neuropathy. These are all discussed in detail with the patient.  - I have re-counseled the patient on diet management and   by adopting a carbohydrate restricted / protein  rich  Diet. - Patient is advised to stick to a routine mealtimes to eat 3 meals  a day and avoid unnecessary snacks ( to snack only to correct hypoglycemia).  Suggestion is made for him to avoid simple  carbohydrates  from his diet including Cakes, Sweet Desserts, Ice Cream, Soda (diet and regular), Sweet Tea, Candies, Chips, Cookies, Store Bought Juices, Alcohol in Excess of  1-2 drinks a day, Artificial Sweeteners, and "Sugar-free" Products. This will help patient to have stable blood glucose profile and potentially avoid unintended weight gain.   - I have approached patient with the following individualized plan to manage diabetes and patient agrees.  - He does not tolerate metformin, I advised him to stay on basal insulin. - I advised him to increase Toujeo to  30 units daily at bedtime associated with monitoring of blood glucose daily before breakfast and at bedtime. - He will be considered for addition of bolus insulin if he cannot achieve control by next visit. - He is advised to call us if he registers blood glucose below 70 or above 200 x3.  - I will add low-dose Januvia 25 mg by mouth every morning with breakfast.  - Patient specific target  for A1c; LDL, HDL, Triglycerides, and  Waist Circumference were discussed in detail.  2) BP/HTN: Controlled. Continue current medications. 3) Lipids/HPL: continue Zetia 10 mg by mouth daily at bedtime.   4)  Weight/Diet: CDE consult in progress, exercise, and carbohydrates information provided.  5) Hypothyroidism:  - His thyroid function tests are consistent with appropriate replacement. - I will continue Levothyroxine 75 g by mouth every morning.  - We discussed about correct intake of levothyroxine, at fasting, with water, separated by at least 30 minutes from breakfast, and separated by more than 4 hours from calcium, iron, multivitamins, acid reflux medications (PPIs). -Patient is made aware of the fact that thyroid hormone replacement is  needed for life, dose to be adjusted by periodic monitoring of thyroid function tests.  6) vitamin D deficiency. He is currently on  vitamin D 3 5000 units daily.  7) Chronic Care/Health Maintenance:  -Patient is encouraged to continue to follow up with Ophthalmology, Podiatrist at least yearly or according to recommendations, and advised to  stay away from smoking. I have recommended yearly flu vaccine and pneumonia vaccination at least every 5 years; moderate intensity exercise for up to 150 minutes weekly; and  sleep for at least 7 hours a day.  - Time spent with the patient: 25 min, of which >50% was spent in reviewing his sugar logs , discussing his hypo- and hyper-glycemic episodes, reviewing  previous labs and insulin doses and developing a plan to avoid hypo- and hyper-glycemia.   I advised patient to maintain close follow up with his PCP for primary care needs.  Patient is asked to bring meter and  blood glucose logs during his next visit.   Follow up plan: Return in about 3 months (around 10/03/2017).  Glade Lloyd, MD Phone: [phone]  Fax: [phone]  This note was partially dictated with voice recognition software. Similar sounding words can be transcribed inadequately or may not  be corrected upon review.  07/03/2017, 9:26 AM

## 2017-07-03 NOTE — Patient Instructions (Signed)

## 2017-08-14 ENCOUNTER — Encounter (INDEPENDENT_AMBULATORY_CARE_PROVIDER_SITE_OTHER): Payer: Self-pay | Admitting: *Deleted

## 2017-08-14 ENCOUNTER — Telehealth (INDEPENDENT_AMBULATORY_CARE_PROVIDER_SITE_OTHER): Payer: Self-pay | Admitting: *Deleted

## 2017-08-14 MED ORDER — PEG 3350-KCL-NA BICARB-NACL 420 G PO SOLR: 4000.0000 mL | Freq: Once | ORAL | 0 refills | Status: AC

## 2017-08-14 NOTE — Telephone Encounter (Signed)
Patient needs trilyte 

## 2017-08-28 ENCOUNTER — Telehealth (INDEPENDENT_AMBULATORY_CARE_PROVIDER_SITE_OTHER): Payer: Self-pay | Admitting: *Deleted

## 2017-08-28 ENCOUNTER — Telehealth: Payer: Self-pay | Admitting: "Endocrinology

## 2017-08-28 ENCOUNTER — Other Ambulatory Visit: Payer: Self-pay | Admitting: "Endocrinology

## 2017-08-28 MED ORDER — LEVOTHYROXINE SODIUM 75 MCG PO TABS: ORAL_TABLET | ORAL | 1 refills | Status: DC

## 2017-08-28 NOTE — Telephone Encounter (Signed)
agree

## 2017-08-28 NOTE — Telephone Encounter (Signed)
Hunter Sparks is needing a refill on levothyroxine (SYNTHROID, LEVOTHROID) 75 MCG tablet   asking for a 90 day supply please advise?

## 2017-08-28 NOTE — Telephone Encounter (Signed)
Referring MD/PCP: hawkins   Procedure: tcs  Reason/Indication:  Hx polyps, fam hx colon ca  Has patient had this procedure before?  Yes, 2012  If so, when, by whom and where?    Is there a family history of colon cancer?  Yes, brother  Who?  What age when diagnosed?    Is patient diabetic?   yes      Does patient have prosthetic heart valve or mechanical valve?  no  Do you have a pacemaker?  no  Has patient ever had endocarditis? no  Has patient had joint replacement within last 12 months?  no  Is patient constipated or take laxatives? no  Does patient have a history of alcohol/drug use?  no  Is patient on Coumadin, Plavix and/or Aspirin? yes  Medications: tramadol prn, toujeo 20 units at bedtime, levothyroxine 75 mg daily, metoprolol 25 mg bid, asa 81 mg daily, vit d dily  Allergies: pcn  Medication Adjustment per Dr Laural Golden: asa 2 days  Procedure date & time: 09/20/17 at 1200

## 2017-09-20 ENCOUNTER — Other Ambulatory Visit: Payer: Self-pay

## 2017-09-20 ENCOUNTER — Ambulatory Visit (HOSPITAL_COMMUNITY)
Admission: RE | Admit: 2017-09-20 | Discharge: 2017-09-20 | Disposition: A | Discharge status: Home / Self Care | Payer: Medicare Other | Source: Ambulatory Visit | Attending: Internal Medicine | Admitting: Internal Medicine

## 2017-09-20 ENCOUNTER — Encounter (HOSPITAL_COMMUNITY): Payer: Self-pay | Admitting: *Deleted

## 2017-09-20 ENCOUNTER — Encounter (HOSPITAL_COMMUNITY)
Admission: RE | Disposition: A | Discharge status: Home / Self Care | Payer: Self-pay | Source: Ambulatory Visit | Attending: Internal Medicine

## 2017-09-20 DIAGNOSIS — M170 Bilateral primary osteoarthritis of knee: Secondary | ICD-10-CM | POA: Diagnosis not present

## 2017-09-20 DIAGNOSIS — Z09 Encounter for follow-up examination after completed treatment for conditions other than malignant neoplasm: Secondary | ICD-10-CM | POA: Diagnosis not present

## 2017-09-20 DIAGNOSIS — D123 Benign neoplasm of transverse colon: Secondary | ICD-10-CM | POA: Diagnosis not present

## 2017-09-20 DIAGNOSIS — Z79899 Other long term (current) drug therapy: Secondary | ICD-10-CM | POA: Insufficient documentation

## 2017-09-20 DIAGNOSIS — Z800 Family history of malignant neoplasm of digestive organs: Secondary | ICD-10-CM | POA: Diagnosis not present

## 2017-09-20 DIAGNOSIS — Z87442 Personal history of urinary calculi: Secondary | ICD-10-CM | POA: Diagnosis not present

## 2017-09-20 DIAGNOSIS — Z794 Long term (current) use of insulin: Secondary | ICD-10-CM | POA: Diagnosis not present

## 2017-09-20 DIAGNOSIS — E039 Hypothyroidism, unspecified: Secondary | ICD-10-CM | POA: Insufficient documentation

## 2017-09-20 DIAGNOSIS — Z880 Allergy status to penicillin: Secondary | ICD-10-CM | POA: Insufficient documentation

## 2017-09-20 DIAGNOSIS — K5730 Diverticulosis of large intestine without perforation or abscess without bleeding: Secondary | ICD-10-CM | POA: Diagnosis not present

## 2017-09-20 DIAGNOSIS — Z7984 Long term (current) use of oral hypoglycemic drugs: Secondary | ICD-10-CM | POA: Insufficient documentation

## 2017-09-20 DIAGNOSIS — E785 Hyperlipidemia, unspecified: Secondary | ICD-10-CM | POA: Diagnosis not present

## 2017-09-20 DIAGNOSIS — Z7982 Long term (current) use of aspirin: Secondary | ICD-10-CM | POA: Insufficient documentation

## 2017-09-20 DIAGNOSIS — F329 Major depressive disorder, single episode, unspecified: Secondary | ICD-10-CM | POA: Diagnosis not present

## 2017-09-20 DIAGNOSIS — Z87891 Personal history of nicotine dependence: Secondary | ICD-10-CM | POA: Insufficient documentation

## 2017-09-20 DIAGNOSIS — Z860100 Personal history of colon polyps, unspecified: Secondary | ICD-10-CM | POA: Insufficient documentation

## 2017-09-20 DIAGNOSIS — M160 Bilateral primary osteoarthritis of hip: Secondary | ICD-10-CM | POA: Diagnosis not present

## 2017-09-20 DIAGNOSIS — M479 Spondylosis, unspecified: Secondary | ICD-10-CM | POA: Insufficient documentation

## 2017-09-20 DIAGNOSIS — I10 Essential (primary) hypertension: Secondary | ICD-10-CM | POA: Diagnosis not present

## 2017-09-20 DIAGNOSIS — Z1211 Encounter for screening for malignant neoplasm of colon: Secondary | ICD-10-CM | POA: Diagnosis not present

## 2017-09-20 DIAGNOSIS — E119 Type 2 diabetes mellitus without complications: Secondary | ICD-10-CM | POA: Insufficient documentation

## 2017-09-20 DIAGNOSIS — D122 Benign neoplasm of ascending colon: Secondary | ICD-10-CM | POA: Diagnosis not present

## 2017-09-20 HISTORY — PX: COLONOSCOPY: SHX5424

## 2017-09-20 LAB — GLUCOSE, CAPILLARY: GLUCOSE-CAPILLARY: 112 mg/dL — AB (ref 65–99)

## 2017-09-20 SURGERY — COLONOSCOPY
Anesthesia: Moderate Sedation

## 2017-09-20 MED ORDER — STERILE WATER FOR IRRIGATION IR SOLN
Status: DC | PRN
  Administered 2017-09-20: 100 mL

## 2017-09-20 MED ORDER — MIDAZOLAM HCL 5 MG/5ML IJ SOLN
INTRAMUSCULAR | Status: DC | PRN
  Administered 2017-09-20: 2 mg via INTRAVENOUS
  Administered 2017-09-20 (×3): 1 mg via INTRAVENOUS

## 2017-09-20 MED ORDER — MEPERIDINE HCL 50 MG/ML IJ SOLN
INTRAMUSCULAR | Status: AC
  Filled 2017-09-20: qty 1

## 2017-09-20 MED ORDER — SODIUM CHLORIDE 0.9 % IV SOLN
INTRAVENOUS | Status: DC
  Administered 2017-09-20: 12:00:00 via INTRAVENOUS

## 2017-09-20 MED ORDER — MIDAZOLAM HCL 5 MG/5ML IJ SOLN
INTRAMUSCULAR | Status: AC
  Filled 2017-09-20: qty 10

## 2017-09-20 MED ORDER — MEPERIDINE HCL 50 MG/ML IJ SOLN
INTRAMUSCULAR | Status: DC | PRN
  Administered 2017-09-20 (×2): 25 mg via INTRAVENOUS

## 2017-09-20 NOTE — Op Note (Signed)
[facility] Patient Name: Hunter Sparks Procedure Date: 09/20/2017 11:52 AM MRN: [id number] Date of Birth: 1946-05-18 Attending MD: Hildred Laser , MD CSN: [id number] Age: 71 Admit Type: Outpatient Procedure:                Colonoscopy Indications:              High risk colon cancer surveillance: Personal                            history of colonic polyps Providers:                Hildred Laser, MD, Janeece Riggers, RN, Rosina Lowenstein, RN Referring MD:             Jasper Loser. Luan Pulling, MD Medicines:                Meperidine 50 mg IV, Midazolam 5 mg IV Complications:            No immediate complications. Estimated Blood Loss:     Estimated blood loss was minimal. Procedure:                Pre-Anesthesia Assessment:                           - Prior to the procedure, a History and Physical                            was performed, and patient medications and                            allergies were reviewed. The patient's tolerance of                            previous anesthesia was also reviewed. The risks                            and benefits of the procedure and the sedation                            options and risks were discussed with the patient.                            All questions were answered, and informed consent                            was obtained. Prior Anticoagulants: The patient has                            taken no previous anticoagulant or antiplatelet                            agents. ASA Grade Assessment: III - A patient with                            severe systemic disease. After reviewing the risks  and benefits, the patient was deemed in                            satisfactory condition to undergo the procedure.                           After obtaining informed consent, the colonoscope                            was passed under direct vision. Throughout the                            procedure, the patient's blood  pressure, pulse, and                            oxygen saturations were monitored continuously. The                            [id number] ([id number]) scope was introduced through                            the anus and advanced to the the cecum, identified                            by appendiceal orifice and ileocecal valve. The                            colonoscopy was performed without difficulty. The                            patient tolerated the procedure well. The quality                            of the bowel preparation was good. The ileocecal                            valve, appendiceal orifice, and rectum were                            photographed. Scope In: 12:53:25 PM Scope Out: 1:17:09 PM Scope Withdrawal Time: 0 hours 16 minutes 5 seconds  Total Procedure Duration: 0 hours 23 minutes 44 seconds  Findings:      The perianal and digital rectal examinations were normal.      Two sessile polyps were found in the transverse colon and ascending       colon. The polyps were small in size. These were biopsied with a cold       forceps for histology. The pathology specimen was placed into Bottle       Number 1.      A 6 mm polyp was found in the transverse colon. The polyp was       semi-sessile. The polyp was removed with a cold snare. Resection and       retrieval were complete. The pathology specimen was placed into Bottle       Number 1.  Scattered medium-mouthed diverticula were found in the sigmoid colon.      No additional abnormalities were found on retroflexion. Impression:               - Two small polyps in the transverse colon and in                            the ascending colon. Biopsied.                           - One 6 mm polyp in the transverse colon, removed                            with a cold snare. Resected and retrieved.                           - Diverticulosis in the sigmoid colon. Moderate Sedation:      Moderate (conscious) sedation was  administered by the endoscopy nurse       and supervised by the endoscopist. The following parameters were       monitored: oxygen saturation, heart rate, blood pressure, CO2       capnography and response to care. Total physician intraservice time was       31 minutes. Recommendation:           - Written discharge instructions were provided to                            the patient.                           - High fiber diet today.                           - Continue present medications.                           - No aspirin, ibuprofen, naproxen, or other                            non-steroidal anti-inflammatory drugs for 1 day.                           - Await pathology results.                           - Repeat colonoscopy in 5 years for surveillance.                           - Patient has a contact number available for                            emergencies. The signs and symptoms of potential                            delayed complications were discussed with the  patient. Return to normal activities tomorrow.                            Written discharge instructions were provided to the                            patient. Procedure Code(s):        --- Professional ---                           [phone], Colonoscopy, flexible; with removal of                            tumor(s), polyp(s), or other lesion(s) by snare                            technique                           45380, 59, Colonoscopy, flexible; with biopsy,                            single or multiple                           99152, Moderate sedation services provided by the                            same physician or other qualified health care                            professional performing the diagnostic or                            therapeutic service that the sedation supports,                            requiring the presence of an independent trained                             observer to assist in the monitoring of the                            patient's level of consciousness and physiological                            status; initial 15 minutes of intraservice time,                            patient age 47 years or older                           [phone], Moderate sedation services; each additional                            15 minutes intraservice  time Diagnosis Code(s):        --- Professional ---                           Z86.010, Personal history of colonic polyps                           D12.3, Benign neoplasm of transverse colon (hepatic                            flexure or splenic flexure)                           D12.2, Benign neoplasm of ascending colon                           K57.30, Diverticulosis of large intestine without                            perforation or abscess without bleeding CPT copyright 2016 [employer]. All rights reserved. The codes documented in this report are preliminary and upon coder review may  be revised to meet current compliance requirements. Hildred Laser, MD Hildred Laser, MD 09/20/2017 1:28:50 PM This report has been signed electronically. Number of Addenda: 0

## 2017-09-20 NOTE — H&P (Signed)
Hunter Sparks is an 71 y.o. male.   Chief Complaint: Patient is here for colonoscopy. HPI: Patient is 71 year old Caucasian male who has a history of colonic adenomas removed on 2 prior colonoscopies.  He denies abdominal pain change in bowel habits or rectal bleeding.  Last colonoscopy was in August 2012. Family history is negative for CRC.  Past Medical History:  Diagnosis Date  . Angina, class II ([place])   . Arthritis    "back, hips, legs" (06/10/2015)      . Chronic lower back pain   . Depression   . DJD (degenerative joint disease)   . Hyperlipidemia 06/11/2015  . Hypertension   . Hypothyroidism   . Kidney stone   . Type II diabetes mellitus (Hunter Sparks)     Past Surgical History:  Procedure Laterality Date  . COLONOSCOPY    . 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 HAND SURGERY    . SKIN CANCER EXCISION Left    "forearm"    Family History  Problem Relation Age of Onset  . Stroke Mother   . Hypertension Mother   . Stomach cancer Father   . Liver cancer Father   . Cancer Father   . Rectal cancer Brother   . Cancer Brother   . Heart attack Neg Hx    Social History:  reports that he quit smoking about 22 years ago. His smoking use included cigarettes. He has a 141.00 pack-year smoking history. His smokeless tobacco use includes chew. He reports that he does not drink alcohol or use drugs.  Allergies:  Allergies  Allergen Reactions  . Penicillins Rash    Has patient had a PCN reaction causing immediate rash, facial/tongue/throat swelling, SOB or lightheadedness with hypotension: Yes Has patient had a PCN reaction causing severe rash involving mucus membranes or skin necrosis: No Has patient had a PCN reaction that required hospitalization: No Has patient had a PCN reaction occurring within the last 10 years: No If all of the above answers are "NO", then may proceed with Cephalosporin use.     Medications Prior to Admission  Medication Sig Dispense Refill  .  aspirin EC 81 MG tablet Take 81 mg by mouth daily.    . Cholecalciferol (VITAMIN D3) 1000 units CAPS Take 1,000 Units by mouth daily.    . Insulin Glargine (TOUJEO SOLOSTAR) 300 UNIT/ML SOPN Inject 30 Units into the skin at bedtime. 4.5 mL 2  . levothyroxine (SYNTHROID, LEVOTHROID) 75 MCG tablet TAKE 1 TABLET BY MOUTH ONCE DAILY BEFORE BREAKFAST 90 tablet 1  . sitaGLIPtin (JANUVIA) 25 MG tablet Take 1 tablet (25 mg total) by mouth daily. 30 tablet 3  . traMADol (ULTRAM) 50 MG tablet Take 1 tablet (50 mg total) by mouth every 6 (six) hours as needed. pain 30 tablet 0  . Blood Glucose Monitoring Suppl (ACCU-CHEK AVIVA) device Use as instructed 1 each 0  . glucose blood (ACCU-CHEK AVIVA) test strip Use 4 times to test glucose 150 each 3  . Insulin Pen Needle (B-D ULTRAFINE III SHORT PEN) 31G X 8 MM MISC 1 each by Does not apply route as directed. 100 each 3  . Lancets MISC 1 each by Does not apply route as directed. 150 each 3  . metoprolol tartrate (LOPRESSOR) 25 MG tablet TAKE ONE TABLET BY MOUTH TWICE DAILY 180 tablet 3    Results for orders placed or performed during the hospital encounter of 09/20/17 (from the past 48 hour(s))  Glucose, capillary  Status: Abnormal   Collection Time: 09/20/17 11:33 AM  Result Value Ref Range   Glucose-Capillary 112 (H) 65 - 99 mg/dL   No results found.  ROS  Blood pressure (!) 156/66, pulse 79, temperature 98.6 F (37 C), temperature source Oral, resp. rate 14, height 6' (1.829 m), weight 185 lb (83.9 kg), SpO2 99 %. Physical Exam  Constitutional: He appears well-developed and well-nourished.  HENT:  Mouth/Throat: Oropharynx is clear and moist.  Eyes: Conjunctivae are normal. No scleral icterus.  Neck: No thyromegaly present.  Cardiovascular: Normal rate, regular rhythm and normal heart sounds.  No murmur heard. Respiratory: Effort normal and breath sounds normal.  Midsternal scar.  GI: Soft. He exhibits no distension and no mass. There is no  tenderness.  Musculoskeletal: He exhibits no edema.  Lymphadenopathy:    He has no cervical adenopathy.  Neurological: He is alert.  Skin: Skin is warm and dry.     Assessment/Plan History of colonic adenomas. Surveillance colonoscopy.  Hildred Laser, MD 09/20/2017, 12:42 PM

## 2017-09-20 NOTE — Discharge Instructions (Signed)
No aspirin for 24 hours. Resume other medications as before. High fiber diet. No driving for 24 hours. Physician will call with biopsy results.   Colonoscopy, Adult, Care After This sheet gives you information about how to care for yourself after your procedure. Your health care provider may also give you more specific instructions. If you have problems or questions, contact your health care provider. What can I expect after the procedure? After the procedure, it is common to have:  A small amount of blood in your stool for 24 hours after the procedure.  Some gas.  Mild abdominal cramping or bloating.  Follow these instructions at home: General instructions   For the first 24 hours after the procedure: ? Do not drive or use machinery. ? Do not sign important documents. ? Do not drink alcohol. ? Do your regular daily activities at a slower pace than normal. ? Eat soft, easy-to-digest foods. ? Rest often.  Take over-the-counter or prescription medicines only as told by your health care provider.  It is up to you to get the results of your procedure. Ask your health care provider, or the department performing the procedure, when your results will be ready. Relieving cramping and bloating  Try walking around when you have cramps or feel bloated.  Apply heat to your abdomen as told by your health care provider. Use a heat source that your health care provider recommends, such as a moist heat pack or a heating pad. ? Place a towel between your skin and the heat source. ? Leave the heat on for 20-30 minutes. ? Remove the heat if your skin turns bright red. This is especially important if you are unable to feel pain, heat, or cold. You may have a greater risk of getting burned. Eating and drinking  Drink enough fluid to keep your urine clear or pale yellow.  Resume your normal diet as instructed by your health care provider. Avoid heavy or fried foods that are hard to  digest.  Avoid drinking alcohol for as long as instructed by your health care provider. Contact a health care provider if:  You have blood in your stool 2-3 days after the procedure. Get help right away if:  You have more than a small spotting of blood in your stool.  You pass large blood clots in your stool.  Your abdomen is swollen.  You have nausea or vomiting.  You have a fever.  You have increasing abdominal pain that is not relieved with medicine. This information is not intended to replace advice given to you by your health care provider. Make sure you discuss any questions you have with your health care provider.    Colon Polyps Polyps are tissue growths inside the body. Polyps can grow in many places, including the large intestine (colon). A polyp may be a round bump or a mushroom-shaped growth. You could have one polyp or several. Most colon polyps are noncancerous (benign). However, some colon polyps can become cancerous over time. What are the causes? The exact cause of colon polyps is not known. What increases the risk? This condition is more likely to develop in people who:  Have a family history of colon cancer or colon polyps.  Are older than 26 or older than 45 if they are African American.  Have inflammatory bowel disease, such as ulcerative colitis or Crohn disease.  Are overweight.  Smoke cigarettes.  Do not get enough exercise.  Drink too much alcohol.  Eat a diet  that is: ? High in fat and red meat. ? Low in fiber.  Had childhood cancer that was treated with abdominal radiation.  What are the signs or symptoms? Most polyps do not cause symptoms. If you have symptoms, they may include:  Blood coming from your rectum when having a bowel movement.  Blood in your stool.The stool may look dark red or black.  A change in bowel habits, such as constipation or diarrhea.  How is this diagnosed? This condition is diagnosed with a colonoscopy.  This is a procedure that uses a lighted, flexible scope to look at the inside of your colon. How is this treated? Treatment for this condition involves removing any polyps that are found. Those polyps will then be tested for cancer. If cancer is found, your health care provider will talk to you about options for colon cancer treatment. Follow these instructions at home: Diet  Eat plenty of fiber, such as fruits, vegetables, and whole grains.  Eat foods that are high in calcium and vitamin D, such as milk, cheese, yogurt, eggs, liver, fish, and broccoli.  Limit foods high in fat, red meats, and processed meats, such as hot dogs, sausage, bacon, and lunch meats.  Maintain a healthy weight, or lose weight if recommended by your health care provider. General instructions  Do not smoke cigarettes.  Do not drink alcohol excessively.  Keep all follow-up visits as told by your health care provider. This is important. This includes keeping regularly scheduled colonoscopies. Talk to your health care provider about when you need a colonoscopy.  Exercise every day or as told by your health care provider. Contact a health care provider if:  You have new or worsening bleeding during a bowel movement.  You have new or increased blood in your stool.  You have a change in bowel habits.  You unexpectedly lose weight. This information is not intended to replace advice given to you by your health care provider. Make sure you discuss any questions you have with your health care provider.    High-Fiber Diet Fiber, also called dietary fiber, is a type of carbohydrate found in fruits, vegetables, whole grains, and beans. A high-fiber diet can have many health benefits. Your health care provider may recommend a high-fiber diet to help: Prevent constipation. Fiber can make your bowel movements more regular. Lower your cholesterol. Relieve hemorrhoids, uncomplicated diverticulosis, or irritable bowel  syndrome. Prevent overeating as part of a weight-loss plan. Prevent heart disease, type 2 diabetes, and certain cancers.  What is my plan? The recommended daily intake of fiber includes: 38 grams for men under age 84. 78 grams for men over age 33. 25 grams for women under age 31. 48 grams for women over age 52.  You can get the recommended daily intake of dietary fiber by eating a variety of fruits, vegetables, grains, and beans. Your health care provider may also recommend a fiber supplement if it is not possible to get enough fiber through your diet. What do I need to know about a high-fiber diet? Fiber supplements have not been widely studied for their effectiveness, so it is better to get fiber through food sources. Always check the fiber content on thenutrition facts label of any prepackaged food. Look for foods that contain at least 5 grams of fiber per serving. Ask your dietitian if you have questions about specific foods that are related to your condition, especially if those foods are not listed in the following section. Increase your daily  fiber consumption gradually. Increasing your intake of dietary fiber too quickly may cause bloating, cramping, or gas. Drink plenty of water. Water helps you to digest fiber. What foods can I eat? Grains Whole-grain breads. Multigrain cereal. Oats and oatmeal. Brown rice. Barley. Bulgur wheat. [place]. Bran muffins. Popcorn. Rye wafer crackers. Vegetables Sweet potatoes. Spinach. Kale. Artichokes. Cabbage. Broccoli. Green peas. Carrots. Squash. Fruits Berries. Pears. Apples. Oranges. Avocados. Prunes and raisins. Dried figs. Meats and Other Protein Sources [place], kidney, pinto, and soy beans. Split peas. Lentils. Nuts and seeds. Dairy Fiber-fortified yogurt. Beverages Fiber-fortified soy milk. Fiber-fortified orange juice. Other Fiber bars. The items listed above may not be a complete list of recommended foods or beverages. Contact your  dietitian for more options. What foods are not recommended? Grains White bread. Pasta made with refined flour. White rice. Vegetables Fried potatoes. Canned vegetables. Well-cooked vegetables. Fruits Fruit juice. Cooked, strained fruit. Meats and Other Protein Sources Fatty cuts of meat. Fried Sales executive or fried fish. Dairy Milk. Yogurt. Cream cheese. Sour cream. Beverages Soft drinks. Other Cakes and pastries. Butter and oils. The items listed above may not be a complete list of foods and beverages to avoid. Contact your dietitian for more information. What are some tips for including high-fiber foods in my diet? Eat a wide variety of high-fiber foods. Make sure that half of all grains consumed each day are whole grains. Replace breads and cereals made from refined flour or white flour with whole-grain breads and cereals. Replace white rice with brown rice, bulgur wheat, or millet. Start the day with a breakfast that is high in fiber, such as a cereal that contains at least 5 grams of fiber per serving. Use beans in place of meat in soups, salads, or pasta. Eat high-fiber snacks, such as berries, raw vegetables, nuts, or popcorn. This information is not intended to replace advice given to you by your health care provider. Make sure you discuss any questions you have with your health care provider.    High-Fiber Diet Fiber, also called dietary fiber, is a type of carbohydrate found in fruits, vegetables, whole grains, and beans. A high-fiber diet can have many health benefits. Your health care provider may recommend a high-fiber diet to help:  Prevent constipation. Fiber can make your bowel movements more regular.  Lower your cholesterol.  Relieve hemorrhoids, uncomplicated diverticulosis, or irritable bowel syndrome.  Prevent overeating as part of a weight-loss plan.  Prevent heart disease, type 2 diabetes, and certain cancers.  What is my plan? The recommended daily intake  of fiber includes:  38 grams for men under age 4.  50 grams for men over age 76.  85 grams for women under age 36.  13 grams for women over age 58.  You can get the recommended daily intake of dietary fiber by eating a variety of fruits, vegetables, grains, and beans. Your health care provider may also recommend a fiber supplement if it is not possible to get enough fiber through your diet. What do I need to know about a high-fiber diet?  Fiber supplements have not been widely studied for their effectiveness, so it is better to get fiber through food sources.  Always check the fiber content on thenutrition facts label of any prepackaged food. Look for foods that contain at least 5 grams of fiber per serving.  Ask your dietitian if you have questions about specific foods that are related to your condition, especially if those foods are not listed in the following section.  Increase your daily fiber consumption gradually. Increasing your intake of dietary fiber too quickly may cause bloating, cramping, or gas.  Drink plenty of water. Water helps you to digest fiber. What foods can I eat? Grains Whole-grain breads. Multigrain cereal. Oats and oatmeal. Brown rice. Barley. Bulgur wheat. [place]. Bran muffins. Popcorn. Rye wafer crackers. Vegetables Sweet potatoes. Spinach. Kale. Artichokes. Cabbage. Broccoli. Green peas. Carrots. Squash. Fruits Berries. Pears. Apples. Oranges. Avocados. Prunes and raisins. Dried figs. Meats and Other Protein Sources [place], kidney, pinto, and soy beans. Split peas. Lentils. Nuts and seeds. Dairy Fiber-fortified yogurt. Beverages Fiber-fortified soy milk. Fiber-fortified orange juice. Other Fiber bars. The items listed above may not be a complete list of recommended foods or beverages. Contact your dietitian for more options. What foods are not recommended? Grains White bread. Pasta made with refined flour. White rice. Vegetables Fried potatoes.  Canned vegetables. Well-cooked vegetables. Fruits Fruit juice. Cooked, strained fruit. Meats and Other Protein Sources Fatty cuts of meat. Fried Sales executive or fried fish. Dairy Milk. Yogurt. Cream cheese. Sour cream. Beverages Soft drinks. Other Cakes and pastries. Butter and oils. The items listed above may not be a complete list of foods and beverages to avoid. Contact your dietitian for more information. What are some tips for including high-fiber foods in my diet?  Eat a wide variety of high-fiber foods.  Make sure that half of all grains consumed each day are whole grains.  Replace breads and cereals made from refined flour or white flour with whole-grain breads and cereals.  Replace white rice with brown rice, bulgur wheat, or millet.  Start the day with a breakfast that is high in fiber, such as a cereal that contains at least 5 grams of fiber per serving.  Use beans in place of meat in soups, salads, or pasta.  Eat high-fiber snacks, such as berries, raw vegetables, nuts, or popcorn. This information is not intended to replace advice given to you by your health care provider. Make sure you discuss any questions you have with your health care provider.

## 2017-09-21 ENCOUNTER — Other Ambulatory Visit: Payer: Self-pay | Admitting: "Endocrinology

## 2017-09-25 ENCOUNTER — Encounter (HOSPITAL_COMMUNITY): Payer: Self-pay | Admitting: Internal Medicine

## 2017-10-02 ENCOUNTER — Other Ambulatory Visit: Payer: Self-pay

## 2017-10-02 MED ORDER — SITAGLIPTIN PHOSPHATE 25 MG PO TABS: 25.0000 mg | ORAL_TABLET | Freq: Every day | ORAL | 3 refills | Status: DC

## 2017-10-03 DIAGNOSIS — E1159 Type 2 diabetes mellitus with other circulatory complications: Secondary | ICD-10-CM | POA: Diagnosis not present

## 2017-10-04 LAB — RENAL FUNCTION PANEL
Albumin: 4 g/dL (ref 3.6–5.1)
BUN / CREAT RATIO: 10 (calc) (ref 6–22)
BUN: 13 mg/dL (ref 7–25)
CALCIUM: 9.3 mg/dL (ref 8.6–10.3)
CHLORIDE: 103 mmol/L (ref 98–110)
CO2: 32 mmol/L (ref 20–32)
Creat: 1.35 mg/dL — ABNORMAL HIGH (ref 0.70–1.18)
Glucose, Bld: 144 mg/dL — ABNORMAL HIGH (ref 65–99)
POTASSIUM: 4.7 mmol/L (ref 3.5–5.3)
Phosphorus: 3.3 mg/dL (ref 2.1–4.3)
SODIUM: 140 mmol/L (ref 135–146)

## 2017-10-04 LAB — HEMOGLOBIN A1C
EAG (MMOL/L): 9.2 (calc)
Hgb A1c MFr Bld: 7.4 % of total Hgb — ABNORMAL HIGH (ref <5.7)
Mean Plasma Glucose: 166 (calc)

## 2017-10-11 ENCOUNTER — Ambulatory Visit (INDEPENDENT_AMBULATORY_CARE_PROVIDER_SITE_OTHER): Payer: Medicare Other | Admitting: "Endocrinology

## 2017-10-11 ENCOUNTER — Encounter: Payer: Self-pay | Admitting: "Endocrinology

## 2017-10-11 VITALS — BP 141/82 | HR 63 | Ht 74.0 in | Wt 195.0 lb

## 2017-10-11 DIAGNOSIS — I10 Essential (primary) hypertension: Secondary | ICD-10-CM | POA: Diagnosis not present

## 2017-10-11 DIAGNOSIS — E782 Mixed hyperlipidemia: Secondary | ICD-10-CM | POA: Diagnosis not present

## 2017-10-11 DIAGNOSIS — E559 Vitamin D deficiency, unspecified: Secondary | ICD-10-CM | POA: Diagnosis not present

## 2017-10-11 DIAGNOSIS — E1159 Type 2 diabetes mellitus with other circulatory complications: Secondary | ICD-10-CM

## 2017-10-11 DIAGNOSIS — E039 Hypothyroidism, unspecified: Secondary | ICD-10-CM

## 2017-10-11 NOTE — Patient Instructions (Signed)

## 2017-10-11 NOTE — Progress Notes (Signed)
Subjective:    Patient ID: Hunter Sparks, male    DOB: 20-Mar-1946,    Past Medical History:  Diagnosis Date  . Angina, class II ([place])   . Arthritis    "back, hips, legs" (06/10/2015)  . Chest pain   . Chronic lower back pain   . Depression   . DJD (degenerative joint disease)   . Hyperlipidemia 06/11/2015  . Hypertension   . Hypothyroidism   . Kidney stone   . Type II diabetes mellitus ([place])    Past Surgical History:  Procedure Laterality Date  . CARDIAC CATHETERIZATION N/A 06/11/2015   Procedure: Left Heart Cath and Coronary Angiography;  Surgeon: Belva Crome, MD;  Location: [facility];  Service: Cardiovascular;  Laterality: N/A;  . COLONOSCOPY  06/21/2011   Procedure: COLONOSCOPY;  Surgeon: Rogene Houston, MD;  [facility];  Service: Endoscopy;  Laterality: N/A;  . COLONOSCOPY    . COLONOSCOPY N/A 09/20/2017   Procedure: COLONOSCOPY;  Surgeon: Rogene Houston, MD;  [facility];  Service: Endoscopy;  Laterality: N/A;  1200  . CORONARY ARTERY BYPASS GRAFT N/A 06/14/2015   Procedure: CORONARY ARTERY BYPASS GRAFTING times four using Left Internal mammary artery and right leg Saphenous vein graft;  Surgeon: Ivin Poot, MD;  Location: [facility];  Service: Open Heart Surgery;  Laterality: N/A;  . 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 HAND SURGERY    . SKIN CANCER EXCISION Left    "forearm"  . TEE WITHOUT CARDIOVERSION N/A 06/14/2015   Procedure: TRANSESOPHAGEAL ECHOCARDIOGRAM (TEE);  Surgeon: Ivin Poot, MD;  Location: [facility];  Service: Open Heart Surgery;  Laterality: N/A;   Social History   Socioeconomic History  . Marital status: Married    Spouse name: None  . Number of children: None  . Years of education: None  . Highest education level: None  Social Needs  . Financial resource strain: None  . Food insecurity - worry: None  . Food insecurity - inability: None  . Transportation needs - medical: None  .  Transportation needs - non-medical: None  Occupational History  . None  Tobacco Use  . Smoking status: Former Smoker    Packs/day: 3.00    Years: 47.00    Pack years: 141.00    Types: Cigarettes    Last attempt to quit: 11/20/1994    Years since quitting: 22.9  . Smokeless tobacco: Current User    Types: Chew  Substance and Sexual Activity  . Alcohol use: No    Alcohol/week: 0.0 oz  . Drug use: No  . Sexual activity: No    Partners: Female  Other Topics Concern  . None  Social History Narrative  . None   Outpatient Encounter Medications as of 10/11/2017  Medication Sig  . ACCU-CHEK AVIVA PLUS test strip USE 4 TIMES DAILY TO TEST GLUCOSE AS DIRECTED  . aspirin EC 81 MG tablet Take 1 tablet (81 mg total) daily by mouth.  . Blood Glucose Monitoring Suppl (ACCU-CHEK AVIVA) device Use as instructed  . Cholecalciferol (VITAMIN D3) 1000 units CAPS Take 2,000 Units by mouth daily.  . Insulin Glargine (TOUJEO SOLOSTAR) 300 UNIT/ML SOPN Inject 30 Units into the skin at bedtime.  . Insulin Pen Needle (B-D ULTRAFINE III SHORT PEN) 31G X 8 MM MISC 1 each by Does not apply route as directed.  . Lancets MISC 1 each by Does not apply route as directed.  Marland Kitchen levothyroxine (  SYNTHROID, LEVOTHROID) 75 MCG tablet TAKE 1 TABLET BY MOUTH ONCE DAILY BEFORE BREAKFAST  . metoprolol tartrate (LOPRESSOR) 25 MG tablet TAKE ONE TABLET BY MOUTH TWICE DAILY  . traMADol (ULTRAM) 50 MG tablet Take 1 tablet (50 mg total) by mouth every 6 (six) hours as needed. pain  . [DISCONTINUED] sitaGLIPtin (JANUVIA) 25 MG tablet Take 1 tablet (25 mg total) by mouth daily. (Patient not taking: Reported on 10/11/2017)   No facility-administered encounter medications on file as of 10/11/2017.    ALLERGIES: Allergies  Allergen Reactions  . Penicillins Rash    Has patient had a PCN reaction causing immediate rash, facial/tongue/throat swelling, SOB or lightheadedness with hypotension: Yes Has patient had a PCN reaction causing  severe rash involving mucus membranes or skin necrosis: No Has patient had a PCN reaction that required hospitalization: No Has patient had a PCN reaction occurring within the last 10 years: No If all of the above answers are "NO", then may proceed with Cephalosporin use.    VACCINATION STATUS:  There is no immunization history on file for this patient.  Diabetes  He presents for his follow-up diabetic visit. He has type 2 diabetes mellitus. Onset time: He was diagnosed at approximate age of 66 years. His disease course has been improving. There are no hypoglycemic associated symptoms. Pertinent negatives for hypoglycemia include no confusion, headaches, pallor or seizures. Associated symptoms include fatigue. Pertinent negatives for diabetes include no chest pain, no polydipsia, no polyphagia, no polyuria and no weakness. There are no hypoglycemic complications. Symptoms are improving. Diabetic complications include heart disease. Risk factors for coronary artery disease include dyslipidemia, diabetes mellitus, hypertension, male sex, sedentary lifestyle and tobacco exposure. Current diabetic treatment includes oral agent (monotherapy). He is compliant with treatment most of the time. His weight is stable. He is following a generally unhealthy diet. He has had a previous visit with a dietitian. He participates in exercise intermittently. His breakfast blood glucose range is generally 140-180 mg/dl. His dinner blood glucose range is generally 140-180 mg/dl. His overall blood glucose range is 140-180 mg/dl. (Patient did not bring any meter nor logs to review today. He does not monitor blood glucose regularly. His A1c has increased to 11.2% from 6.8%.)  Hypertension  This is a chronic problem. The current episode started more than 1 year ago. Pertinent negatives include no chest pain, headaches, neck pain, palpitations or shortness of breath. Risk factors for coronary artery disease include diabetes  mellitus, dyslipidemia, sedentary lifestyle and smoking/tobacco exposure.  Hyperlipidemia  This is a chronic problem. The current episode started more than 1 year ago. Exacerbating diseases include diabetes. Pertinent negatives include no chest pain, myalgias or shortness of breath. Current antihyperlipidemic treatment includes ezetimibe. Risk factors for coronary artery disease include diabetes mellitus, dyslipidemia, a sedentary lifestyle and male sex.    Review of Systems  Constitutional: Positive for fatigue. Negative for unexpected weight change.  HENT: Negative for dental problem, mouth sores and trouble swallowing.   Eyes: Negative for visual disturbance.  Respiratory: Negative for cough, choking, chest tightness, shortness of breath and wheezing.   Cardiovascular: Negative for chest pain, palpitations and leg swelling.        He recently  underwent quadruple coronary artery bypass graft.  Gastrointestinal: Negative for abdominal distention, abdominal pain, constipation, diarrhea, nausea and vomiting.  Endocrine: Negative for polydipsia, polyphagia and polyuria.  Genitourinary: Negative for dysuria, flank pain, hematuria and urgency.  Musculoskeletal: Negative for back pain, gait problem, myalgias and neck pain.  Skin: Negative for pallor, rash and wound.  Neurological: Negative for seizures, syncope, weakness, numbness and headaches.  Psychiatric/Behavioral: Negative.  Negative for confusion and dysphoric mood.    Objective:    BP (!) 141/82   Pulse 63   Ht 6\' 2"  (1.88 m)   Wt 195 lb (88.5 kg)   BMI 25.04 kg/m   Wt Readings from Last 3 Encounters:  10/11/17 195 lb (88.5 kg)  09/20/17 185 lb (83.9 kg)  07/03/17 191 lb (86.6 kg)    Physical Exam  Constitutional: He is oriented to person, place, and time. He appears well-developed and well-nourished. He is cooperative. No distress.  HENT:  Head: Normocephalic and atraumatic.  Eyes: EOM are normal.  Neck: Normal range of  motion. Neck supple. No tracheal deviation present. No thyromegaly present.  Cardiovascular: Normal rate, S1 normal, S2 normal and normal heart sounds. Exam reveals no gallop.  No murmur heard. Pulses:      Dorsalis pedis pulses are 1+ on the right side, and 1+ on the left side.       Posterior tibial pulses are 1+ on the right side, and 1+ on the left side.  Healed sternotomy surgical wound for CABG.  Pulmonary/Chest: Breath sounds normal. No respiratory distress. He has no wheezes.  Abdominal: Soft. Bowel sounds are normal. He exhibits no distension. There is no tenderness. There is no guarding and no CVA tenderness.  Musculoskeletal: He exhibits no edema.       Right shoulder: He exhibits no swelling and no deformity.  Neurological: He is alert and oriented to person, place, and time. He has normal strength and normal reflexes. No cranial nerve deficit or sensory deficit. Gait normal.  Skin: Skin is warm and dry. No rash noted. No cyanosis. Nails show no clubbing.  Psychiatric: He has a normal mood and affect. His speech is normal. Judgment normal. Cognition and memory are normal.    Results for orders placed or performed during the hospital encounter of 09/20/17  Glucose, capillary  Result Value Ref Range   Glucose-Capillary 112 (H) 65 - 99 mg/dL   Complete Blood Count (Most recent): Lab Results  Component Value Date   WBC 7.3 12/25/2016   HGB 15.4 12/25/2016   HCT 46.0 12/25/2016   MCV 90.7 12/25/2016   PLT 200 12/25/2016   Chemistry (most recent): Lab Results  Component Value Date   NA 140 10/03/2017   K 4.7 10/03/2017   CL 103 10/03/2017   CO2 32 10/03/2017   BUN 13 10/03/2017   CREATININE 1.35 (H) 10/03/2017   Lipid Panel     Component Value Date/Time   CHOL 217 (H) 02/18/2016 0834   TRIG 141 02/18/2016 0834   HDL 43 02/18/2016 0834   CHOLHDL 5.0 02/18/2016 0834   VLDL 28 02/18/2016 0834   LDLCALC 146 (H) 02/18/2016 0834   Results for BLAIR, LUNDEEN (MRN  [id number]) as of 10/11/2017 12:42  Ref. Range 08/18/2015 00:00 12/25/2016 13:31 03/21/2017 07:57 06/25/2017 07:57 10/03/2017 07:40  Glucose Latest Ref Range: 65 - 99 mg/dL  248 (H) 125 (H) 184 (H) 144 (H)  Hemoglobin A1C Latest Ref Range: <5.7 % of total Hgb 6.7 (A) 11.2 (H) 7.8 (H) 9.2 (H) 7.4 (H)    Assessment & Plan:   1. Type 2 diabetes mellitus with vascular disease ([place]), CKD.  His diabetes is  complicated by recent coronary artery disease status post coronary artery bypass graft. - He came with  controlled and near target blood  glucose profile, A1c improving to 7.4% from 9.2%.   - Recent labs reviewed, showing stage 2 renal failure. - Patient remains at a high risk for more acute and chronic complications of diabetes which include CAD, CVA, CKD, retinopathy, and neuropathy. These are all discussed in detail with the patient.  - I have re-counseled the patient on diet management and   by adopting a carbohydrate restricted / protein rich  Diet. - Patient is advised to stick to a routine mealtimes to eat 3 meals  a day and avoid unnecessary snacks ( to snack only to correct hypoglycemia).  -  Suggestion is made for him to avoid simple carbohydrates  from his diet including Cakes, Sweet Desserts / Pastries, Ice Cream, Soda (diet and regular), Sweet Tea, Candies, Chips, Cookies, Store Bought Juices, Alcohol in Excess of  1-2 drinks a day, Artificial Sweeteners, and "Sugar-free" Products. This will help patient to have stable blood glucose profile and potentially avoid unintended weight gain.   - I have approached patient with the following individualized plan to manage diabetes and patient agrees.  - He does not tolerate metformin, I advised him to stay on basal insulin. - I advised him to  continue Toujeo   30 units daily at bedtime associated with monitoring of blood glucose daily before breakfast and at bedtime. - He will be considered for addition of bolus insulin if he cannot achieve  control by next visit. - He is advised to call us if he registers blood glucose below 70 or above 200 x3.  -  He could not afford Januvia ,  I advised him to stay off of it for now.   - Patient specific target  for A1c; LDL, HDL, Triglycerides, and  Waist Circumference were discussed in detail.  2) BP/HTN: Controlled. Continue current medications. 3) Lipids/HPL: continue Zetia 10 mg by mouth daily at bedtime. He'll have repeat fasting lipid panel before next visit.  4)  Weight/Diet: CDE consult in progress, exercise, and carbohydrates information provided.  5) Hypothyroidism:  - His thyroid function tests are consistent with appropriate replacement.  -  I will continue levothyroxine 75 g by mouth every morning.    - We discussed about correct intake of levothyroxine, at fasting, with water, separated by at least 30 minutes from breakfast, and separated by more than 4 hours from calcium, iron, multivitamins, acid reflux medications (PPIs). -Patient is made aware of the fact that thyroid hormone replacement is needed for life, dose to be adjusted by periodic monitoring of thyroid function tests.  6) vitamin D deficiency. - I advised him to increase his vitamin D 2 2000 units daily.   7) Chronic Care/Health Maintenance:  -Patient is encouraged to continue to follow up with Ophthalmology, Podiatrist at least yearly or according to recommendations, and advised to  stay away from smoking. I have recommended yearly flu vaccine and pneumonia vaccination at least every 5 years; moderate intensity exercise for up to 150 minutes weekly; and  sleep for at least 7 hours a day.  I advised patient to maintain close follow up with   Sinda Du, M.D. for primary care needs.  - Time spent with the patient: 25 min, of which >50% was spent in reviewing his sugar logs , discussing his hypo- and hyper-glycemic episodes, reviewing his current and  previous labs and insulin doses and developing a plan to  avoid hypo- and hyper-glycemia.    Follow up plan: Return in about 3 months (around 01/10/2018) for follow  up with pre-visit labs, meter, and logs.  Glade Lloyd, MD Phone: [phone]  Fax: [phone]  This note was partially dictated with voice recognition software. Similar sounding words can be transcribed inadequately or may not  be corrected upon review.  10/11/2017, 12:47 PM

## 2017-11-21 ENCOUNTER — Other Ambulatory Visit: Payer: Self-pay | Admitting: "Endocrinology

## 2017-12-07 ENCOUNTER — Other Ambulatory Visit: Payer: Self-pay

## 2017-12-07 MED ORDER — BLOOD GLUCOSE MONITOR KIT: PACK | 0 refills | Status: DC

## 2018-01-03 DIAGNOSIS — I10 Essential (primary) hypertension: Secondary | ICD-10-CM | POA: Diagnosis not present

## 2018-01-03 DIAGNOSIS — E1159 Type 2 diabetes mellitus with other circulatory complications: Secondary | ICD-10-CM | POA: Diagnosis not present

## 2018-01-03 DIAGNOSIS — E559 Vitamin D deficiency, unspecified: Secondary | ICD-10-CM | POA: Diagnosis not present

## 2018-01-03 DIAGNOSIS — E039 Hypothyroidism, unspecified: Secondary | ICD-10-CM | POA: Diagnosis not present

## 2018-01-04 LAB — COMPLETE METABOLIC PANEL WITH GFR
AG RATIO: 1.5 (calc) (ref 1.0–2.5)
ALKALINE PHOSPHATASE (APISO): 78 U/L (ref 40–115)
ALT: 11 U/L (ref 9–46)
AST: 14 U/L (ref 10–35)
Albumin: 4.3 g/dL (ref 3.6–5.1)
BILIRUBIN TOTAL: 0.7 mg/dL (ref 0.2–1.2)
BUN/Creatinine Ratio: 16 (calc) (ref 6–22)
BUN: 20 mg/dL (ref 7–25)
CALCIUM: 9.7 mg/dL (ref 8.6–10.3)
CHLORIDE: 100 mmol/L (ref 98–110)
CO2: 31 mmol/L (ref 20–32)
Creat: 1.27 mg/dL — ABNORMAL HIGH (ref 0.70–1.18)
GFR, Est African American: 65 mL/min/{1.73_m2} (ref >=60)
GFR, Est Non African American: 56 mL/min/{1.73_m2} — ABNORMAL LOW (ref >=60)
Globulin: 2.9 g/dL (calc) (ref 1.9–3.7)
Glucose, Bld: 195 mg/dL — ABNORMAL HIGH (ref 65–99)
POTASSIUM: 4.4 mmol/L (ref 3.5–5.3)
Sodium: 140 mmol/L (ref 135–146)
Total Protein: 7.2 g/dL (ref 6.1–8.1)

## 2018-01-04 LAB — MICROALBUMIN / CREATININE URINE RATIO
CREATININE, URINE: 228 mg/dL (ref 20–320)
MICROALB UR: 5.1 mg/dL
Microalb Creat Ratio: 22 mcg/mg creat (ref <30)

## 2018-01-04 LAB — HEMOGLOBIN A1C
EAG (MMOL/L): 11.7 (calc)
HEMOGLOBIN A1C: 9 %{Hb} — AB (ref <5.7)
MEAN PLASMA GLUCOSE: 212 (calc)

## 2018-01-04 LAB — VITAMIN D 25 HYDROXY (VIT D DEFICIENCY, FRACTURES): VIT D 25 HYDROXY: 28 ng/mL — AB (ref 30–100)

## 2018-01-04 LAB — LIPID PANEL
Cholesterol: 253 mg/dL — ABNORMAL HIGH (ref <200)
HDL: 39 mg/dL — ABNORMAL LOW (ref >40)
LDL Cholesterol (Calc): 177 mg/dL (calc) — ABNORMAL HIGH
NON-HDL CHOLESTEROL (CALC): 214 mg/dL — AB (ref <130)
Total CHOL/HDL Ratio: 6.5 (calc) — ABNORMAL HIGH (ref <5.0)
Triglycerides: 207 mg/dL — ABNORMAL HIGH (ref <150)

## 2018-01-04 LAB — T4, FREE: FREE T4: 1.2 ng/dL (ref 0.8–1.8)

## 2018-01-04 LAB — TSH: TSH: 2.48 m[IU]/L (ref 0.40–4.50)

## 2018-01-11 ENCOUNTER — Ambulatory Visit (INDEPENDENT_AMBULATORY_CARE_PROVIDER_SITE_OTHER): Payer: PPO | Admitting: "Endocrinology

## 2018-01-11 ENCOUNTER — Encounter: Payer: Self-pay | Admitting: "Endocrinology

## 2018-01-11 VITALS — BP 135/81 | HR 66 | Ht 74.0 in | Wt 196.0 lb

## 2018-01-11 DIAGNOSIS — E1159 Type 2 diabetes mellitus with other circulatory complications: Secondary | ICD-10-CM | POA: Diagnosis not present

## 2018-01-11 DIAGNOSIS — E039 Hypothyroidism, unspecified: Secondary | ICD-10-CM | POA: Diagnosis not present

## 2018-01-11 DIAGNOSIS — E782 Mixed hyperlipidemia: Secondary | ICD-10-CM

## 2018-01-11 DIAGNOSIS — I10 Essential (primary) hypertension: Secondary | ICD-10-CM | POA: Diagnosis not present

## 2018-01-11 MED ORDER — INSULIN GLARGINE 300 UNIT/ML ~~LOC~~ SOPN: 40.0000 [IU] | PEN_INJECTOR | Freq: Every day | SUBCUTANEOUS | 2 refills | Status: DC

## 2018-01-11 MED ORDER — EVOLOCUMAB 140 MG/ML ~~LOC~~ SOAJ
140.0000 mg | SUBCUTANEOUS | 2 refills | Status: DC
Start: 2018-01-11 — End: 2018-01-25

## 2018-01-11 NOTE — Progress Notes (Signed)
Subjective:    Patient ID: Hunter Sparks, male    DOB: December 25, 1945,    Past Medical History:  Diagnosis Date  . Angina, class II ([place])   . Arthritis    "back, hips, legs" (06/10/2015)  . Chest pain   . Chronic lower back pain   . Depression   . DJD (degenerative joint disease)   . Hyperlipidemia 06/11/2015  . Hypertension   . Hypothyroidism   . Kidney stone   . Type II diabetes mellitus ([place])    Past Surgical History:  Procedure Laterality Date  . CARDIAC CATHETERIZATION N/A 06/11/2015   Procedure: Left Heart Cath and Coronary Angiography;  Surgeon: Belva Crome, MD;  Location: [facility];  Service: Cardiovascular;  Laterality: N/A;  . COLONOSCOPY  06/21/2011   Procedure: COLONOSCOPY;  Surgeon: Rogene Houston, MD;  [facility];  Service: Endoscopy;  Laterality: N/A;  . COLONOSCOPY    . COLONOSCOPY N/A 09/20/2017   Procedure: COLONOSCOPY;  Surgeon: Rogene Houston, MD;  [facility];  Service: Endoscopy;  Laterality: N/A;  1200  . CORONARY ARTERY BYPASS GRAFT N/A 06/14/2015   Procedure: CORONARY ARTERY BYPASS GRAFTING times four using Left Internal mammary artery and right leg Saphenous vein graft;  Surgeon: Ivin Poot, MD;  Location: [facility];  Service: Open Heart Surgery;  Laterality: N/A;  . 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 HAND SURGERY    . SKIN CANCER EXCISION Left    "forearm"  . TEE WITHOUT CARDIOVERSION N/A 06/14/2015   Procedure: TRANSESOPHAGEAL ECHOCARDIOGRAM (TEE);  Surgeon: Ivin Poot, MD;  Location: [facility];  Service: Open Heart Surgery;  Laterality: N/A;   Social History   Socioeconomic History  . Marital status: Married    Spouse name: None  . Number of children: None  . Years of education: None  . Highest education level: None  Social Needs  . Financial resource strain: None  . Food insecurity - worry: None  . Food insecurity - inability: None  . Transportation needs - medical: None  .  Transportation needs - non-medical: None  Occupational History  . None  Tobacco Use  . Smoking status: Former Smoker    Packs/day: 3.00    Years: 47.00    Pack years: 141.00    Types: Cigarettes    Last attempt to quit: 11/20/1994    Years since quitting: 23.1  . Smokeless tobacco: Current User    Types: Chew  Substance and Sexual Activity  . Alcohol use: No    Alcohol/week: 0.0 oz  . Drug use: No  . Sexual activity: No    Partners: Female  Other Topics Concern  . None  Social History Narrative  . None   Outpatient Encounter Medications as of 01/11/2018  Medication Sig  . ACCU-CHEK AVIVA PLUS test strip USE 4 TIMES DAILY TO TEST GLUCOSE AS DIRECTED  . aspirin EC 81 MG tablet Take 1 tablet (81 mg total) daily by mouth.  . blood glucose meter kit and supplies KIT Dispense based on patient and insurance preference. Use up to four times daily as directed. (FOR ICD-10 E11.65)  . Blood Glucose Monitoring Suppl (ACCU-CHEK AVIVA) device Use as instructed  . Cholecalciferol (VITAMIN D3) 1000 units CAPS Take 2,000 Units by mouth daily.  . Evolocumab (REPATHA SURECLICK) 253 MG/ML SOAJ Inject 140 mg into the skin every 14 (fourteen) days.  . Insulin Glargine (TOUJEO SOLOSTAR) 300 UNIT/ML SOPN Inject 40 Units  into the skin at bedtime.  . Insulin Pen Needle (B-D ULTRAFINE III SHORT PEN) 31G X 8 MM MISC 1 each by Does not apply route as directed.  . Lancets MISC 1 each by Does not apply route as directed.  Marland Kitchen levothyroxine (SYNTHROID, LEVOTHROID) 75 MCG tablet TAKE 1 TABLET BY MOUTH ONCE DAILY BEFORE BREAKFAST  . metoprolol tartrate (LOPRESSOR) 25 MG tablet TAKE ONE TABLET BY MOUTH TWICE DAILY  . traMADol (ULTRAM) 50 MG tablet Take 1 tablet (50 mg total) by mouth every 6 (six) hours as needed. pain  . [DISCONTINUED] TOUJEO SOLOSTAR 300 UNIT/ML SOPN INJECT 30 UNITS INTO THE SKIN AT BEDTIME   No facility-administered encounter medications on file as of 01/11/2018.    ALLERGIES: Allergies   Allergen Reactions  . Penicillins Rash    Has patient had a PCN reaction causing immediate rash, facial/tongue/throat swelling, SOB or lightheadedness with hypotension: Yes Has patient had a PCN reaction causing severe rash involving mucus membranes or skin necrosis: No Has patient had a PCN reaction that required hospitalization: No Has patient had a PCN reaction occurring within the last 10 years: No If all of the above answers are "NO", then may proceed with Cephalosporin use.    VACCINATION STATUS:  There is no immunization history on file for this patient.  Diabetes  He presents for his follow-up diabetic visit. He has type 2 diabetes mellitus. Onset time: He was diagnosed at approximate age of 31 years. His disease course has been worsening. There are no hypoglycemic associated symptoms. Pertinent negatives for hypoglycemia include no confusion, headaches, pallor or seizures. Associated symptoms include fatigue, polydipsia and polyuria. Pertinent negatives for diabetes include no chest pain, no polyphagia and no weakness. There are no hypoglycemic complications. Symptoms are worsening. Diabetic complications include heart disease. Risk factors for coronary artery disease include dyslipidemia, diabetes mellitus, hypertension, male sex, sedentary lifestyle and tobacco exposure. Current diabetic treatment includes oral agent (monotherapy). He is compliant with treatment most of the time. His weight is increasing steadily. He is following a generally unhealthy diet. He has had a previous visit with a dietitian. He participates in exercise intermittently. His breakfast blood glucose range is generally 140-180 mg/dl. His bedtime blood glucose range is generally >200 mg/dl. His overall blood glucose range is >200 mg/dl. (Patient did not bring any meter nor logs to review today. He does not monitor blood glucose regularly. His A1c has increased to 11.2% from 6.8%.) An ACE inhibitor/angiotensin II  receptor blocker is not being taken.  Hypertension  This is a chronic problem. The current episode started more than 1 year ago. The problem is controlled. Pertinent negatives include no chest pain, headaches, neck pain, palpitations or shortness of breath. Risk factors for coronary artery disease include diabetes mellitus, dyslipidemia, sedentary lifestyle and smoking/tobacco exposure.  Hyperlipidemia  This is a chronic problem. The current episode started more than 1 year ago. The problem is uncontrolled. Exacerbating diseases include diabetes. Pertinent negatives include no chest pain, myalgias or shortness of breath. Current antihyperlipidemic treatment includes ezetimibe. The current treatment provides no improvement (His LDL remains significantly above target at 146 , he has history of significant statin intolerance.) of lipids. Risk factors for coronary artery disease include diabetes mellitus, dyslipidemia, a sedentary lifestyle and male sex.    Review of Systems  Constitutional: Positive for fatigue. Negative for unexpected weight change.  HENT: Negative for dental problem, mouth sores and trouble swallowing.   Eyes: Negative for visual disturbance.  Respiratory: Negative for  cough, choking, chest tightness, shortness of breath and wheezing.   Cardiovascular: Negative for chest pain, palpitations and leg swelling.        He recently  underwent quadruple coronary artery bypass graft.  Gastrointestinal: Negative for abdominal distention, abdominal pain, constipation, diarrhea, nausea and vomiting.  Endocrine: Positive for polydipsia and polyuria. Negative for polyphagia.  Genitourinary: Negative for dysuria, flank pain, hematuria and urgency.  Musculoskeletal: Negative for back pain, gait problem, myalgias and neck pain.  Skin: Negative for pallor, rash and wound.  Neurological: Negative for seizures, syncope, weakness, numbness and headaches.  Psychiatric/Behavioral: Negative.  Negative  for confusion and dysphoric mood.    Objective:    BP 135/81   Pulse 66   Ht _0  (1.88 m)   Wt 196 lb (88.9 kg)   BMI 25.16 kg/m   Wt Readings from Last 3 Encounters:  01/11/18 196 lb (88.9 kg)  10/11/17 195 lb (88.5 kg)  09/20/17 185 lb (83.9 kg)    Physical Exam  Constitutional: He is oriented to person, place, and time. He appears well-developed and well-nourished. He is cooperative. No distress.  HENT:  Head: Normocephalic and atraumatic.  Eyes: EOM are normal.  Neck: Normal range of motion. Neck supple. No tracheal deviation present. No thyromegaly present.  Cardiovascular: Normal rate, S1 normal, S2 normal and normal heart sounds. Exam reveals no gallop.  No murmur heard. Pulses:      Dorsalis pedis pulses are 1+ on the right side, and 1+ on the left side.       Posterior tibial pulses are 1+ on the right side, and 1+ on the left side.  Healed sternotomy surgical wound for CABG.  Pulmonary/Chest: Breath sounds normal. No respiratory distress. He has no wheezes.  Abdominal: Soft. Bowel sounds are normal. He exhibits no distension. There is no tenderness. There is no guarding and no CVA tenderness.  Musculoskeletal: He exhibits no edema.       Right shoulder: He exhibits no swelling and no deformity.  Neurological: He is alert and oriented to person, place, and time. He has normal strength and normal reflexes. No cranial nerve deficit or sensory deficit. Gait normal.  Skin: Skin is warm and dry. No rash noted. No cyanosis. Nails show no clubbing.  Psychiatric: He has a normal mood and affect. His speech is normal. Judgment normal. Cognition and memory are normal.    Results for orders placed or performed in visit on 10/11/17  TSH  Result Value Ref Range   TSH 2.48 0.40 - 4.50 mIU/L  T4, free  Result Value Ref Range   Free T4 1.2 0.8 - 1.8 ng/dL  VITAMIN D 25 Hydroxy (Vit-D Deficiency, Fractures)  Result Value Ref Range   Vit D, 25-Hydroxy 28 (L) 30 - 100 ng/mL   COMPLETE METABOLIC PANEL WITH GFR  Result Value Ref Range   Glucose, Bld 195 (H) 65 - 99 mg/dL   BUN 20 7 - 25 mg/dL   Creat 1.27 (H) 0.70 - 1.18 mg/dL   GFR, Est Non African American 56 (L) > OR = 60 mL/min/1.71m   GFR, Est African American 65 > OR = 60 mL/min/1.720m  BUN/Creatinine Ratio 16 6 - 22 (calc)   Sodium 140 135 - 146 mmol/L   Potassium 4.4 3.5 - 5.3 mmol/L   Chloride 100 98 - 110 mmol/L   CO2 31 20 - 32 mmol/L   Calcium 9.7 8.6 - 10.3 mg/dL   Total Protein 7.2 6.1 - 8.1  g/dL   Albumin 4.3 3.6 - 5.1 g/dL   Globulin 2.9 1.9 - 3.7 g/dL (calc)   AG Ratio 1.5 1.0 - 2.5 (calc)   Total Bilirubin 0.7 0.2 - 1.2 mg/dL   Alkaline phosphatase (APISO) 78 40 - 115 U/L   AST 14 10 - 35 U/L   ALT 11 9 - 46 U/L  Hemoglobin A1c  Result Value Ref Range   Hgb A1c MFr Bld 9.0 (H) <5.7 % of total Hgb   Mean Plasma Glucose 212 (calc)   eAG (mmol/L) 11.7 (calc)  Lipid panel  Result Value Ref Range   Cholesterol 253 (H) <200 mg/dL   HDL 39 (L) >40 mg/dL   Triglycerides 207 (H) <150 mg/dL   LDL Cholesterol (Calc) 177 (H) mg/dL (calc)   Total CHOL/HDL Ratio 6.5 (H) <5.0 (calc)   Non-HDL Cholesterol (Calc) 214 (H) <130 mg/dL (calc)  Microalbumin / creatinine urine ratio  Result Value Ref Range   Creatinine, Urine 228 20 - 320 mg/dL   Microalb, Ur 5.1 mg/dL   Microalb Creat Ratio 22 <30 mcg/mg creat   Complete Blood Count (Most recent): Lab Results  Component Value Date   WBC 7.3 12/25/2016   HGB 15.4 12/25/2016   HCT 46.0 12/25/2016   MCV 90.7 12/25/2016   PLT 200 12/25/2016   Chemistry (most recent): Lab Results  Component Value Date   NA 140 01/03/2018   K 4.4 01/03/2018   CL 100 01/03/2018   CO2 31 01/03/2018   BUN 20 01/03/2018   CREATININE 1.27 (H) 01/03/2018   Lipid Panel     Component Value Date/Time   CHOL 253 (H) 01/03/2018 0739   TRIG 207 (H) 01/03/2018 0739   HDL 39 (L) 01/03/2018 0739   CHOLHDL 6.5 (H) 01/03/2018 0739   VLDL 28 02/18/2016 0834    LDLCALC 146 (H) 02/18/2016 0834   Results for Hunter, Sparks (MRN [id number]) as of 10/11/2017 12:42  Ref. Range 08/18/2015 00:00 12/25/2016 13:31 03/21/2017 07:57 06/25/2017 07:57 10/03/2017 07:40  Glucose Latest Ref Range: 65 - 99 mg/dL  248 (H) 125 (H) 184 (H) 144 (H)  Hemoglobin A1C Latest Ref Range: <5.7 % of total Hgb 6.7 (A) 11.2 (H) 7.8 (H) 9.2 (H) 7.4 (H)    Assessment & Plan:   1. Type 2 diabetes mellitus with vascular disease ([place]), CKD.  His diabetes is  complicated by recent coronary artery disease status post coronary artery bypass graft. - He came with  controlled fasting blood glucose profile, uncontrolled postprandial blood glucose profile.  His A1c is increasing to 9% from 7.4%.    - Recent labs reviewed, showing stage 2 renal failure. - Patient remains at a high risk for more acute and chronic complications of diabetes which include CAD, CVA, CKD, retinopathy, and neuropathy. These are all discussed in detail with the patient.  - I have re-counseled the patient on diet management and   by adopting a carbohydrate restricted / protein rich  Diet. - Patient is advised to stick to a routine mealtimes to eat 3 meals  a day and avoid unnecessary snacks ( to snack only to correct hypoglycemia).  -  Suggestion is made for him to avoid simple carbohydrates  from his diet including Cakes, Sweet Desserts / Pastries, Ice Cream, Soda (diet and regular), Sweet Tea, Candies, Chips, Cookies, Store Bought Juices, Alcohol in Excess of  1-2 drinks a day, Artificial Sweeteners, and "Sugar-free" Products. This will help patient to have stable blood glucose profile  and potentially avoid unintended weight gain.  - I have approached patient with the following individualized plan to manage diabetes and patient agrees.  -He is losing control of diabetes.  He will likely need prandial insulin in order for him to achieve control of diabetes to target.   -In preparation, I approach him to start  monitoring blood glucose 4 times a day-before meals and at bedtime and return in 2 weeks with meter and logs for reevaluation.  -In the meantime, I have advised him to increase his Toujeo to 40 units daily at bedtime. - He is advised to call us if he registers blood glucose below 70 or above 200 x3.  -  He could not afford Januvia . - He does not tolerate metformin. - Patient specific target  for A1c; LDL, HDL, Triglycerides, and  Waist Circumference were discussed in detail.  2) BP/HTN: Controlled. Continue current medications. 3) Lipids/HPL: His repeat labs show LDL is still significantly above target at 146.  He does not tolerate statins.  Patient with established coronary artery disease status post CABG.  Continue Zetia 10 mg by mouth daily at bedtime.  He is a good candidate for PCSK 9  inhibitor therapy. I discussed and prescribed  Repatha 140 mg subcutaneously every 2 weeks.    4)  Weight/Diet: CDE consult in progress, exercise, and carbohydrates information provided.  5) Hypothyroidism:  -His thyroid function tests are consistent with appropriate replacement. -  I will continue levothyroxine 75 g by mouth every morning.    - We discussed about correct intake of levothyroxine, at fasting, with water, separated by at least 30 minutes from breakfast, and separated by more than 4 hours from calcium, iron, multivitamins, acid reflux medications (PPIs). -Patient is made aware of the fact that thyroid hormone replacement is needed for life, dose to be adjusted by periodic monitoring of thyroid function tests.  6) vitamin D deficiency. - I advised him to continue his vitamin D 2 2000 units daily.   7) Chronic Care/Health Maintenance:  -Patient is encouraged to continue to follow up with Ophthalmology, Podiatrist at least yearly or according to recommendations, and advised to  stay away from smoking. I have recommended yearly flu vaccine and pneumonia vaccination at least every 5 years;  moderate intensity exercise for up to 150 minutes weekly; and  sleep for at least 7 hours a day.  I advised patient to maintain close follow up with   Sinda Du, M.D. for primary care needs.  - Time spent with the patient: 25 min, of which >50% was spent in reviewing his blood glucose logs , discussing his hypo- and hyper-glycemic episodes, reviewing his current and  previous labs and insulin doses and developing a plan to avoid hypo- and hyper-glycemia. Please refer to Patient Instructions for Blood Glucose Monitoring and Insulin/Medications Dosing Guide"  in media tab for additional information.  Follow up plan: Return in about 2 weeks (around 01/25/2018) for follow up with meter and logs- no labs.  Hunter Lloyd, MD Phone: [phone]  Fax: [phone]  This note was partially dictated with voice recognition software. Similar sounding words can be transcribed inadequately or may not  be corrected upon review.  01/11/2018, 11:25 AM

## 2018-01-21 ENCOUNTER — Telehealth: Payer: Self-pay | Admitting: "Endocrinology

## 2018-01-21 NOTE — Telephone Encounter (Signed)
Bobbys bloodsugar is running high  02/08 Am-125 bl- 184 Bd- 114 bt -244  02/09 AM- 105 BL-160 BD-142 BT-245  02/10 AM 94 BL-203 BD-221 BT-211  02/11 AM 150  Please advise of any changes necessary

## 2018-01-21 NOTE — Telephone Encounter (Signed)
Hunter Sparks (wife) is aware of recommendation

## 2018-01-21 NOTE — Telephone Encounter (Signed)
No change for now, keep monitoring 4 times a day, keep appointment.

## 2018-01-25 ENCOUNTER — Ambulatory Visit (INDEPENDENT_AMBULATORY_CARE_PROVIDER_SITE_OTHER): Payer: PPO | Admitting: "Endocrinology

## 2018-01-25 ENCOUNTER — Encounter: Payer: Self-pay | Admitting: "Endocrinology

## 2018-01-25 VITALS — BP 134/82 | HR 56 | Ht 74.0 in | Wt 196.0 lb

## 2018-01-25 DIAGNOSIS — I10 Essential (primary) hypertension: Secondary | ICD-10-CM

## 2018-01-25 DIAGNOSIS — E1159 Type 2 diabetes mellitus with other circulatory complications: Secondary | ICD-10-CM | POA: Diagnosis not present

## 2018-01-25 DIAGNOSIS — E039 Hypothyroidism, unspecified: Secondary | ICD-10-CM

## 2018-01-25 DIAGNOSIS — E782 Mixed hyperlipidemia: Secondary | ICD-10-CM | POA: Diagnosis not present

## 2018-01-25 NOTE — Progress Notes (Signed)
Subjective:    Patient ID: Hunter Sparks, male    DOB: Jun 21, 1946,    Past Medical History:  Diagnosis Date  . Angina, class II ([place])   . Arthritis    "back, hips, legs" (06/10/2015)  . Chest pain   . Chronic lower back pain   . Depression   . DJD (degenerative joint disease)   . Hyperlipidemia 06/11/2015  . Hypertension   . Hypothyroidism   . Kidney stone   . Type II diabetes mellitus ([place])    Past Surgical History:  Procedure Laterality Date  . CARDIAC CATHETERIZATION N/A 06/11/2015   Procedure: Left Heart Cath and Coronary Angiography;  Surgeon: Belva Crome, MD;  Location: [facility];  Service: Cardiovascular;  Laterality: N/A;  . COLONOSCOPY  06/21/2011   Procedure: COLONOSCOPY;  Surgeon: Rogene Houston, MD;  [facility];  Service: Endoscopy;  Laterality: N/A;  . COLONOSCOPY    . COLONOSCOPY N/A 09/20/2017   Procedure: COLONOSCOPY;  Surgeon: Rogene Houston, MD;  [facility];  Service: Endoscopy;  Laterality: N/A;  1200  . CORONARY ARTERY BYPASS GRAFT N/A 06/14/2015   Procedure: CORONARY ARTERY BYPASS GRAFTING times four using Left Internal mammary artery and right leg Saphenous vein graft;  Surgeon: Ivin Poot, MD;  Location: [facility];  Service: Open Heart Surgery;  Laterality: N/A;  . 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 HAND SURGERY    . SKIN CANCER EXCISION Left    "forearm"  . TEE WITHOUT CARDIOVERSION N/A 06/14/2015   Procedure: TRANSESOPHAGEAL ECHOCARDIOGRAM (TEE);  Surgeon: Ivin Poot, MD;  Location: [facility];  Service: Open Heart Surgery;  Laterality: N/A;   Social History   Socioeconomic History  . Marital status: Married    Spouse name: None  . Number of children: None  . Years of education: None  . Highest education level: None  Social Needs  . Financial resource strain: None  . Food insecurity - worry: None  . Food insecurity - inability: None  . Transportation needs - medical: None  .  Transportation needs - non-medical: None  Occupational History  . None  Tobacco Use  . Smoking status: Former Smoker    Packs/day: 3.00    Years: 47.00    Pack years: 141.00    Types: Cigarettes    Last attempt to quit: 11/20/1994    Years since quitting: 23.1  . Smokeless tobacco: Current User    Types: Chew  Substance and Sexual Activity  . Alcohol use: No    Alcohol/week: 0.0 oz  . Drug use: No  . Sexual activity: No    Partners: Female  Other Topics Concern  . None  Social History Narrative  . None   Outpatient Encounter Medications as of 01/25/2018  Medication Sig  . ACCU-CHEK AVIVA PLUS test strip USE 4 TIMES DAILY TO TEST GLUCOSE AS DIRECTED  . aspirin EC 81 MG tablet Take 1 tablet (81 mg total) daily by mouth.  . blood glucose meter kit and supplies KIT Dispense based on patient and insurance preference. Use up to four times daily as directed. (FOR ICD-10 E11.65)  . Blood Glucose Monitoring Suppl (ACCU-CHEK AVIVA) device Use as instructed  . Cholecalciferol (VITAMIN D3) 1000 units CAPS Take 2,000 Units by mouth daily.  . Insulin Glargine (TOUJEO SOLOSTAR) 300 UNIT/ML SOPN Inject 40 Units into the skin at bedtime.  . Insulin Pen Needle (B-D ULTRAFINE III SHORT PEN) 31G X 8  MM MISC 1 each by Does not apply route as directed.  . Lancets MISC 1 each by Does not apply route as directed.  Marland Kitchen levothyroxine (SYNTHROID, LEVOTHROID) 75 MCG tablet TAKE 1 TABLET BY MOUTH ONCE DAILY BEFORE BREAKFAST  . metoprolol tartrate (LOPRESSOR) 25 MG tablet TAKE ONE TABLET BY MOUTH TWICE DAILY  . traMADol (ULTRAM) 50 MG tablet Take 1 tablet (50 mg total) by mouth every 6 (six) hours as needed. pain  . [DISCONTINUED] Evolocumab (REPATHA SURECLICK) 326 MG/ML SOAJ Inject 140 mg into the skin every 14 (fourteen) days.   No facility-administered encounter medications on file as of 01/25/2018.    ALLERGIES: Allergies  Allergen Reactions  . Penicillins Rash    Has patient had a PCN reaction causing  immediate rash, facial/tongue/throat swelling, SOB or lightheadedness with hypotension: Yes Has patient had a PCN reaction causing severe rash involving mucus membranes or skin necrosis: No Has patient had a PCN reaction that required hospitalization: No Has patient had a PCN reaction occurring within the last 10 years: No If all of the above answers are "NO", then may proceed with Cephalosporin use.    VACCINATION STATUS:  There is no immunization history on file for this patient.  Diabetes  He presents for his follow-up diabetic visit. He has type 2 diabetes mellitus. Onset time: He was diagnosed at approximate age of 29 years. His disease course has been improving. There are no hypoglycemic associated symptoms. Pertinent negatives for hypoglycemia include no confusion, headaches, pallor or seizures. Associated symptoms include fatigue, polydipsia and polyuria. Pertinent negatives for diabetes include no chest pain, no polyphagia and no weakness. There are no hypoglycemic complications. Symptoms are improving. Diabetic complications include heart disease. Risk factors for coronary artery disease include dyslipidemia, diabetes mellitus, hypertension, male sex, sedentary lifestyle and tobacco exposure. Current diabetic treatment includes oral agent (monotherapy). He is compliant with treatment most of the time. His weight is stable. He is following a generally unhealthy diet. He has had a previous visit with a dietitian. He participates in exercise intermittently. His breakfast blood glucose range is generally 140-180 mg/dl. His lunch blood glucose range is generally 140-180 mg/dl. His dinner blood glucose range is generally 140-180 mg/dl. His bedtime blood glucose range is generally 140-180 mg/dl. His overall blood glucose range is 140-180 mg/dl. (Since his last visit with adjusted basal insulin, patient came with significantly improved average blood glucose over the last 14 days at 160.  His most  recent A1c was elevated at 9%.  ) An ACE inhibitor/angiotensin II receptor blocker is not being taken.  Hypertension  This is a chronic problem. The current episode started more than 1 year ago. The problem is controlled. Pertinent negatives include no chest pain, headaches, neck pain, palpitations or shortness of breath. Risk factors for coronary artery disease include diabetes mellitus, dyslipidemia, sedentary lifestyle and smoking/tobacco exposure.  Hyperlipidemia  This is a chronic problem. The current episode started more than 1 year ago. The problem is uncontrolled. Exacerbating diseases include diabetes. Pertinent negatives include no chest pain, myalgias or shortness of breath. Current antihyperlipidemic treatment includes ezetimibe (He does not tolerate statins, he could not afford the co-pay for Repatha.). The current treatment provides no improvement (His LDL remains significantly above target at 146 , he has history of significant statin intolerance.) of lipids. Risk factors for coronary artery disease include diabetes mellitus, dyslipidemia, a sedentary lifestyle and male sex.    Review of Systems  Constitutional: Positive for fatigue. Negative for unexpected weight  change.  HENT: Negative for dental problem, mouth sores and trouble swallowing.   Eyes: Negative for visual disturbance.  Respiratory: Negative for cough, choking, chest tightness, shortness of breath and wheezing.   Cardiovascular: Negative for chest pain, palpitations and leg swelling.        He recently  underwent quadruple coronary artery bypass graft.  Gastrointestinal: Negative for abdominal distention, abdominal pain, constipation, diarrhea, nausea and vomiting.  Endocrine: Positive for polydipsia and polyuria. Negative for polyphagia.  Genitourinary: Negative for dysuria, flank pain, hematuria and urgency.  Musculoskeletal: Negative for back pain, gait problem, myalgias and neck pain.  Skin: Negative for pallor,  rash and wound.  Neurological: Negative for seizures, syncope, weakness, numbness and headaches.  Psychiatric/Behavioral: Negative.  Negative for confusion and dysphoric mood.    Objective:    BP 134/82   Pulse (!) 56   Ht _0  (1.88 m)   Wt 196 lb (88.9 kg)   BMI 25.16 kg/m   Wt Readings from Last 3 Encounters:  01/25/18 196 lb (88.9 kg)  01/11/18 196 lb (88.9 kg)  10/11/17 195 lb (88.5 kg)    Physical Exam  Constitutional: He is oriented to person, place, and time. He appears well-developed. He is cooperative. No distress.  HENT:  Head: Normocephalic and atraumatic.  Eyes: EOM are normal.  Neck: Normal range of motion. Neck supple. No tracheal deviation present. No thyromegaly present.  Cardiovascular: Normal rate, S1 normal and S2 normal. Exam reveals no gallop.  No murmur heard. Pulses:      Dorsalis pedis pulses are 1+ on the right side, and 1+ on the left side.       Posterior tibial pulses are 1+ on the right side, and 1+ on the left side.  Healed sternotomy surgical wound for CABG.  Pulmonary/Chest: No respiratory distress. He has no wheezes.  Abdominal: Bowel sounds are normal. He exhibits no distension. There is no tenderness. There is no guarding and no CVA tenderness.  + Obese abdomen  Musculoskeletal: He exhibits no edema.       Right shoulder: He exhibits no swelling and no deformity.  Neurological: He is alert and oriented to person, place, and time. He has normal strength and normal reflexes. No cranial nerve deficit or sensory deficit. Gait normal.  Skin: Skin is warm and dry. No rash noted. No cyanosis. Nails show no clubbing.  Psychiatric: He has a normal mood and affect. His speech is normal. Judgment normal. Cognition and memory are normal.    Results for orders placed or performed in visit on 10/11/17  TSH  Result Value Ref Range   TSH 2.48 0.40 - 4.50 mIU/L  T4, free  Result Value Ref Range   Free T4 1.2 0.8 - 1.8 ng/dL  VITAMIN D 25 Hydroxy  (Vit-D Deficiency, Fractures)  Result Value Ref Range   Vit D, 25-Hydroxy 28 (L) 30 - 100 ng/mL  COMPLETE METABOLIC PANEL WITH GFR  Result Value Ref Range   Glucose, Bld 195 (H) 65 - 99 mg/dL   BUN 20 7 - 25 mg/dL   Creat 1.27 (H) 0.70 - 1.18 mg/dL   GFR, Est Non African American 56 (L) > OR = 60 mL/min/1.72m   GFR, Est African American 65 > OR = 60 mL/min/1.756m  BUN/Creatinine Ratio 16 6 - 22 (calc)   Sodium 140 135 - 146 mmol/L   Potassium 4.4 3.5 - 5.3 mmol/L   Chloride 100 98 - 110 mmol/L   CO2 31 20 -  32 mmol/L   Calcium 9.7 8.6 - 10.3 mg/dL   Total Protein 7.2 6.1 - 8.1 g/dL   Albumin 4.3 3.6 - 5.1 g/dL   Globulin 2.9 1.9 - 3.7 g/dL (calc)   AG Ratio 1.5 1.0 - 2.5 (calc)   Total Bilirubin 0.7 0.2 - 1.2 mg/dL   Alkaline phosphatase (APISO) 78 40 - 115 U/L   AST 14 10 - 35 U/L   ALT 11 9 - 46 U/L  Hemoglobin A1c  Result Value Ref Range   Hgb A1c MFr Bld 9.0 (H) <5.7 % of total Hgb   Mean Plasma Glucose 212 (calc)   eAG (mmol/L) 11.7 (calc)  Lipid panel  Result Value Ref Range   Cholesterol 253 (H) <200 mg/dL   HDL 39 (L) >40 mg/dL   Triglycerides 207 (H) <150 mg/dL   LDL Cholesterol (Calc) 177 (H) mg/dL (calc)   Total CHOL/HDL Ratio 6.5 (H) <5.0 (calc)   Non-HDL Cholesterol (Calc) 214 (H) <130 mg/dL (calc)  Microalbumin / creatinine urine ratio  Result Value Ref Range   Creatinine, Urine 228 20 - 320 mg/dL   Microalb, Ur 5.1 mg/dL   Microalb Creat Ratio 22 <30 mcg/mg creat   Complete Blood Count (Most recent): Lab Results  Component Value Date   WBC 7.3 12/25/2016   HGB 15.4 12/25/2016   HCT 46.0 12/25/2016   MCV 90.7 12/25/2016   PLT 200 12/25/2016   Chemistry (most recent): Lab Results  Component Value Date   NA 140 01/03/2018   K 4.4 01/03/2018   CL 100 01/03/2018   CO2 31 01/03/2018   BUN 20 01/03/2018   CREATININE 1.27 (H) 01/03/2018   Lipid Panel     Component Value Date/Time   CHOL 253 (H) 01/03/2018 0739   TRIG 207 (H) 01/03/2018 0739    HDL 39 (L) 01/03/2018 0739   CHOLHDL 6.5 (H) 01/03/2018 0739   VLDL 28 02/18/2016 0834   LDLCALC 177 (H) 01/03/2018 0739    Results for KODEN, HUNZEKER (MRN [id number]) as of 01/25/2018 11:53  Ref. Range 06/25/2017 07:57 10/03/2017 07:40 01/03/2018 07:39  Hemoglobin A1C Latest Ref Range: <5.7 % of total Hgb 9.2 (H) 7.4 (H) 9.0 (H)    Assessment & Plan:   1. Type 2 diabetes mellitus with vascular disease ([place]), CKD.  His diabetes is  complicated by recent coronary artery disease status post coronary artery bypass graft. - He came with  controlled fasting and postprandial blood glucose profile with adjusted basal insulin.  -He did not document no reported hypoglycemia.   -His recent labs show A1c of 9% increasing from 7.4%.   - Recent labs reviewed, showing stage 2 renal failure. - Patient remains at a high risk for more acute and chronic complications of diabetes which include CAD, CVA, CKD, retinopathy, and neuropathy. These are all discussed in detail with the patient.  - I have re-counseled the patient on diet management and   by adopting a carbohydrate restricted / protein rich  Diet. - Patient is advised to stick to a routine mealtimes to eat 3 meals  a day and avoid unnecessary snacks ( to snack only to correct hypoglycemia).  -  Suggestion is made for him to avoid simple carbohydrates  from his diet including Cakes, Sweet Desserts / Pastries, Ice Cream, Soda (diet and regular), Sweet Tea, Candies, Chips, Cookies, Store Bought Juices, Alcohol in Excess of  1-2 drinks a day, Artificial Sweeteners, and "Sugar-free" Products. This will help patient to have  stable blood glucose profile and potentially avoid unintended weight gain.   - I have approached patient with the following individualized plan to manage diabetes and patient agrees.  -Based on his presentation with controlled glycemic profile across the day, he will not require prandial insulin for now.   -He is advised to stay  on Toujeo 40 units nightly, associated with strict monitoring of blood glucose 2 times a day-daily before breakfast and at bedtime.   - He is advised to call us if he registers blood glucose below 70 or above 200 x3.  -  He could not afford Januvia . - He does not tolerate metformin. - Patient specific target  for A1c; LDL, HDL, Triglycerides, and  Waist Circumference were discussed in detail.  2) BP/HTN: His blood pressure is controlled to target.  He is advised to continue his current blood pressure medications.    3) Lipids/HPL: His repeat labs show LDL is still significantly above target at 146.  He does not tolerate statins.  Patient with established coronary artery disease status post CABG.  Continue Zetia 10 mg by mouth daily at bedtime.  He is a good candidate for PCSK 9  inhibitor therapy, however he could not afford the $150 co-pay required.   4)  Weight/Diet: CDE consult in progress, exercise, and carbohydrates information provided.  5) Hypothyroidism:  -His thyroid function tests are consistent with appropriate replacement. -  I will continue levothyroxine 75 g by mouth every morning.    - We discussed about correct intake of levothyroxine, at fasting, with water, separated by at least 30 minutes from breakfast, and separated by more than 4 hours from calcium, iron, multivitamins, acid reflux medications (PPIs). -Patient is made aware of the fact that thyroid hormone replacement is needed for life, dose to be adjusted by periodic monitoring of thyroid function tests.  6) vitamin D deficiency. - I advised him to continue his vitamin D 2 2000 units daily.   7) Chronic Care/Health Maintenance:  -Patient is encouraged to continue to follow up with Ophthalmology, Podiatrist at least yearly or according to recommendations, and advised to  stay away from smoking. I have recommended yearly flu vaccine and pneumonia vaccination at least every 5 years; moderate intensity exercise for up  to 150 minutes weekly; and  sleep for at least 7 hours a day.  I advised patient to maintain close follow up with   Sinda Du, M.D. for primary care needs.  - Time spent with the patient: 25 min, of which >50% was spent in reviewing his blood glucose logs , discussing his hypo- and hyper-glycemic episodes, reviewing his current and  previous labs and insulin doses and developing a plan to avoid hypo- and hyper-glycemia. Please refer to Patient Instructions for Blood Glucose Monitoring and Insulin/Medications Dosing Guide"  in media tab for additional information. Hunter Sparks participated in the discussions, expressed understanding, and voiced agreement with the above plans.  All questions were answered to his satisfaction. he is encouraged to contact clinic should he have any questions or concerns prior to his return visit.   Follow up plan: Return in about 10 weeks (around 04/05/2018) for meter, and logs, follow up with pre-visit labs, meter, and logs.  Glade Lloyd, MD Phone: [phone]  Fax: [phone]  This note was partially dictated with voice recognition software. Similar sounding words can be transcribed inadequately or may not  be corrected upon review.  01/25/2018, 9:44 AM

## 2018-01-25 NOTE — Patient Instructions (Signed)

## 2018-02-23 ENCOUNTER — Other Ambulatory Visit: Payer: Self-pay | Admitting: "Endocrinology

## 2018-03-28 DIAGNOSIS — E1159 Type 2 diabetes mellitus with other circulatory complications: Secondary | ICD-10-CM | POA: Diagnosis not present

## 2018-03-29 LAB — COMPLETE METABOLIC PANEL WITH GFR
AG RATIO: 1.5 (calc) (ref 1.0–2.5)
ALT: 11 U/L (ref 9–46)
AST: 17 U/L (ref 10–35)
Albumin: 4.1 g/dL (ref 3.6–5.1)
Alkaline phosphatase (APISO): 68 U/L (ref 40–115)
BILIRUBIN TOTAL: 0.6 mg/dL (ref 0.2–1.2)
BUN / CREAT RATIO: 16 (calc) (ref 6–22)
BUN: 21 mg/dL (ref 7–25)
CHLORIDE: 105 mmol/L (ref 98–110)
CO2: 31 mmol/L (ref 20–32)
Calcium: 9.2 mg/dL (ref 8.6–10.3)
Creat: 1.32 mg/dL — ABNORMAL HIGH (ref 0.70–1.18)
GFR, EST AFRICAN AMERICAN: 62 mL/min/{1.73_m2} (ref >=60)
GFR, Est Non African American: 53 mL/min/{1.73_m2} — ABNORMAL LOW (ref >=60)
Globulin: 2.7 g/dL (calc) (ref 1.9–3.7)
Glucose, Bld: 143 mg/dL — ABNORMAL HIGH (ref 65–99)
POTASSIUM: 4.5 mmol/L (ref 3.5–5.3)
SODIUM: 142 mmol/L (ref 135–146)
TOTAL PROTEIN: 6.8 g/dL (ref 6.1–8.1)

## 2018-03-29 LAB — HEMOGLOBIN A1C
Hgb A1c MFr Bld: 8.1 % of total Hgb — ABNORMAL HIGH (ref <5.7)
Mean Plasma Glucose: 186 (calc)
eAG (mmol/L): 10.3 (calc)

## 2018-03-30 ENCOUNTER — Other Ambulatory Visit: Payer: Self-pay | Admitting: Cardiology

## 2018-04-05 ENCOUNTER — Encounter: Payer: Self-pay | Admitting: "Endocrinology

## 2018-04-05 ENCOUNTER — Ambulatory Visit (INDEPENDENT_AMBULATORY_CARE_PROVIDER_SITE_OTHER): Payer: PPO | Admitting: "Endocrinology

## 2018-04-05 VITALS — BP 148/73 | HR 64 | Ht 74.0 in | Wt 195.8 lb

## 2018-04-05 DIAGNOSIS — E039 Hypothyroidism, unspecified: Secondary | ICD-10-CM | POA: Diagnosis not present

## 2018-04-05 DIAGNOSIS — E782 Mixed hyperlipidemia: Secondary | ICD-10-CM

## 2018-04-05 DIAGNOSIS — E1159 Type 2 diabetes mellitus with other circulatory complications: Secondary | ICD-10-CM

## 2018-04-05 DIAGNOSIS — I10 Essential (primary) hypertension: Secondary | ICD-10-CM

## 2018-04-05 MED ORDER — INSULIN GLARGINE 300 UNIT/ML ~~LOC~~ SOPN: 44.0000 [IU] | PEN_INJECTOR | Freq: Every day | SUBCUTANEOUS | 2 refills | Status: DC

## 2018-04-05 NOTE — Progress Notes (Signed)
Subjective:    Patient ID: Hunter Sparks, male    DOB: 03-Jul-1946,    Past Medical History:  Diagnosis Date  . Angina, class II ([place])   . Arthritis    "back, hips, legs" (06/10/2015)  . Chest pain   . Chronic lower back pain   . Depression   . DJD (degenerative joint disease)   . Hyperlipidemia 06/11/2015  . Hypertension   . Hypothyroidism   . Kidney stone   . Type II diabetes mellitus ([place])    Past Surgical History:  Procedure Laterality Date  . CARDIAC CATHETERIZATION N/A 06/11/2015   Procedure: Left Heart Cath and Coronary Angiography;  Surgeon: Belva Crome, MD;  Location: [facility];  Service: Cardiovascular;  Laterality: N/A;  . COLONOSCOPY  06/21/2011   Procedure: COLONOSCOPY;  Surgeon: Rogene Houston, MD;  [facility];  Service: Endoscopy;  Laterality: N/A;  . COLONOSCOPY    . COLONOSCOPY N/A 09/20/2017   Procedure: COLONOSCOPY;  Surgeon: Rogene Houston, MD;  [facility];  Service: Endoscopy;  Laterality: N/A;  1200  . CORONARY ARTERY BYPASS GRAFT N/A 06/14/2015   Procedure: CORONARY ARTERY BYPASS GRAFTING times four using Left Internal mammary artery and right leg Saphenous vein graft;  Surgeon: Ivin Poot, MD;  Location: [facility];  Service: Open Heart Surgery;  Laterality: N/A;  . 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 HAND SURGERY    . SKIN CANCER EXCISION Left    "forearm"  . TEE WITHOUT CARDIOVERSION N/A 06/14/2015   Procedure: TRANSESOPHAGEAL ECHOCARDIOGRAM (TEE);  Surgeon: Ivin Poot, MD;  Location: [place];  Service: Open Heart Surgery;  Laterality: N/A;   Social History   Socioeconomic History  . Marital status: Married    Spouse name: Not on file  . Number of children: Not on file  . Years of education: Not on file  . Highest education level: Not on file  Occupational History  . Not on file  Social Needs  . Financial resource strain: Not on file  . Food insecurity:    Worry: Not on file     Inability: Not on file  . Transportation needs:    Medical: Not on file    Non-medical: Not on file  Tobacco Use  . Smoking status: Former Smoker    Packs/day: 3.00    Years: 47.00    Pack years: 141.00    Types: Cigarettes    Last attempt to quit: 11/20/1994    Years since quitting: 23.3  . Smokeless tobacco: Current User    Types: Chew  Substance and Sexual Activity  . Alcohol use: No    Alcohol/week: 0.0 oz  . Drug use: No  . Sexual activity: Never    Partners: Female  Lifestyle  . Physical activity:    Days per week: Not on file    Minutes per session: Not on file  . Stress: Not on file  Relationships  . Social connections:    Talks on phone: Not on file    Gets together: Not on file    Attends religious service: Not on file    Active member of club or organization: Not on file    Attends meetings of clubs or organizations: Not on file    Relationship status: Not on file  Other Topics Concern  . Not on file  Social History Narrative  . Not on file   Outpatient Encounter Medications as of 04/05/2018  Medication Sig  . ACCU-CHEK AVIVA PLUS test strip USE 4 TIMES DAILY TO TEST GLUCOSE AS DIRECTED  . aspirin EC 81 MG tablet Take 1 tablet (81 mg total) daily by mouth.  . blood glucose meter kit and supplies KIT Dispense based on patient and insurance preference. Use up to four times daily as directed. (FOR ICD-10 E11.65)  . Blood Glucose Monitoring Suppl (ACCU-CHEK AVIVA) device Use as instructed  . Cholecalciferol (VITAMIN D3) 1000 units CAPS Take 2,000 Units by mouth daily.  . Insulin Glargine (TOUJEO SOLOSTAR) 300 UNIT/ML SOPN Inject 44 Units into the skin at bedtime.  . Insulin Pen Needle (B-D ULTRAFINE III SHORT PEN) 31G X 8 MM MISC 1 each by Does not apply route as directed.  . Lancets MISC 1 each by Does not apply route as directed.  Marland Kitchen levothyroxine (SYNTHROID, LEVOTHROID) 75 MCG tablet TAKE 1 TABLET BY MOUTH ONCE DAILY BEFORE BREAKFAST  . metoprolol tartrate  (LOPRESSOR) 25 MG tablet TAKE 1 TABLET BY MOUTH TWICE DAILY  . traMADol (ULTRAM) 50 MG tablet Take 1 tablet (50 mg total) by mouth every 6 (six) hours as needed. pain  . [DISCONTINUED] Insulin Glargine (TOUJEO SOLOSTAR) 300 UNIT/ML SOPN Inject 40 Units into the skin at bedtime.   No facility-administered encounter medications on file as of 04/05/2018.    ALLERGIES: Allergies  Allergen Reactions  . Penicillins Rash    Has patient had a PCN reaction causing immediate rash, facial/tongue/throat swelling, SOB or lightheadedness with hypotension: Yes Has patient had a PCN reaction causing severe rash involving mucus membranes or skin necrosis: No Has patient had a PCN reaction that required hospitalization: No Has patient had a PCN reaction occurring within the last 10 years: No If all of the above answers are "NO", then may proceed with Cephalosporin use.    VACCINATION STATUS:  There is no immunization history on file for this patient.  Diabetes  He presents for his follow-up diabetic visit. He has type 2 diabetes mellitus. Onset time: He was diagnosed at approximate age of 1 years. His disease course has been improving. There are no hypoglycemic associated symptoms. Pertinent negatives for hypoglycemia include no confusion, headaches, pallor or seizures. Associated symptoms include fatigue. Pertinent negatives for diabetes include no chest pain, no polydipsia, no polyphagia, no polyuria and no weakness. There are no hypoglycemic complications. Symptoms are improving. Diabetic complications include heart disease. Risk factors for coronary artery disease include dyslipidemia, diabetes mellitus, hypertension, male sex, sedentary lifestyle and tobacco exposure. Current diabetic treatment includes oral agent (monotherapy). He is compliant with treatment most of the time. His weight is stable. He has had a previous visit with a dietitian. He participates in exercise intermittently. His breakfast blood  glucose range is generally 140-180 mg/dl. His bedtime blood glucose range is generally 140-180 mg/dl. His overall blood glucose range is 140-180 mg/dl. (Since his last visit with adjusted basal insulin, patient came with significantly improved average blood glucose over the last 14 days at 160.  His most recent A1c was elevated at 9%.  ) An ACE inhibitor/angiotensin II receptor blocker is not being taken.  Hypertension  This is a chronic problem. The current episode started more than 1 year ago. The problem is uncontrolled. Pertinent negatives include no chest pain, headaches, neck pain, palpitations or shortness of breath. Risk factors for coronary artery disease include diabetes mellitus, dyslipidemia, sedentary lifestyle, smoking/tobacco exposure and male gender. Past treatments include beta blockers. Hypertensive end-organ damage includes kidney disease and CAD/MI.  Hyperlipidemia  This is a chronic problem. The current episode started more than 1 year ago. The problem is uncontrolled. Exacerbating diseases include diabetes. Pertinent negatives include no chest pain, myalgias or shortness of breath. Current antihyperlipidemic treatment includes ezetimibe (He does not tolerate statins, he could not afford the co-pay for Repatha.). The current treatment provides no improvement (His LDL remains significantly above target at 146 , he has history of significant statin intolerance.) of lipids. Risk factors for coronary artery disease include diabetes mellitus, dyslipidemia, a sedentary lifestyle and male sex.    Review of Systems  Constitutional: Positive for fatigue. Negative for unexpected weight change.  HENT: Negative for dental problem, mouth sores and trouble swallowing.   Eyes: Negative for visual disturbance.  Respiratory: Negative for cough, choking, chest tightness, shortness of breath and wheezing.   Cardiovascular: Negative for chest pain, palpitations and leg swelling.        He recently   underwent quadruple coronary artery bypass graft.  Gastrointestinal: Negative for abdominal distention, abdominal pain, constipation, diarrhea, nausea and vomiting.  Endocrine: Negative for polydipsia, polyphagia and polyuria.  Genitourinary: Negative for dysuria, flank pain, hematuria and urgency.  Musculoskeletal: Negative for back pain, gait problem, myalgias and neck pain.  Skin: Negative for pallor, rash and wound.  Neurological: Negative for seizures, syncope, weakness, numbness and headaches.  Psychiatric/Behavioral: Negative.  Negative for confusion and dysphoric mood.    Objective:    BP (!) 148/73   Pulse 64   Ht 6' 2"  (1.88 m)   Wt 195 lb 12.8 oz (88.8 kg)   BMI 25.14 kg/m   Wt Readings from Last 3 Encounters:  04/05/18 195 lb 12.8 oz (88.8 kg)  01/25/18 196 lb (88.9 kg)  01/11/18 196 lb (88.9 kg)    Physical Exam  Constitutional: He is oriented to person, place, and time. He appears well-developed. He is cooperative. No distress.  HENT:  Head: Normocephalic and atraumatic.  Eyes: EOM are normal.  Neck: Normal range of motion. Neck supple. No tracheal deviation present. No thyromegaly present.  Cardiovascular: Normal rate, S1 normal and S2 normal. Exam reveals no gallop.  No murmur heard. Pulses:      Dorsalis pedis pulses are 1+ on the right side, and 1+ on the left side.       Posterior tibial pulses are 1+ on the right side, and 1+ on the left side.  Healed sternotomy surgical wound for CABG.  Pulmonary/Chest: Effort normal. No respiratory distress. He has no wheezes.  Abdominal: He exhibits no distension. There is no tenderness. There is no guarding and no CVA tenderness.  + Obese abdomen  Musculoskeletal: He exhibits no edema.       Right shoulder: He exhibits no swelling and no deformity.  Neurological: He is alert and oriented to person, place, and time. He has normal strength and normal reflexes. No cranial nerve deficit or sensory deficit. Gait normal.   Skin: Skin is warm and dry. No rash noted. No cyanosis. Nails show no clubbing.  Psychiatric: He has a normal mood and affect. His speech is normal. Judgment normal. Cognition and memory are normal.    Results for orders placed or performed in visit on 01/25/18  COMPLETE METABOLIC PANEL WITH GFR  Result Value Ref Range   Glucose, Bld 143 (H) 65 - 99 mg/dL   BUN 21 7 - 25 mg/dL   Creat 1.32 (H) 0.70 - 1.18 mg/dL   GFR, Est Non African American 53 (L) > OR = 60 mL/min/1.69m  GFR, Est African American 62 > OR = 60 mL/min/1.41m   BUN/Creatinine Ratio 16 6 - 22 (calc)   Sodium 142 135 - 146 mmol/L   Potassium 4.5 3.5 - 5.3 mmol/L   Chloride 105 98 - 110 mmol/L   CO2 31 20 - 32 mmol/L   Calcium 9.2 8.6 - 10.3 mg/dL   Total Protein 6.8 6.1 - 8.1 g/dL   Albumin 4.1 3.6 - 5.1 g/dL   Globulin 2.7 1.9 - 3.7 g/dL (calc)   AG Ratio 1.5 1.0 - 2.5 (calc)   Total Bilirubin 0.6 0.2 - 1.2 mg/dL   Alkaline phosphatase (APISO) 68 40 - 115 U/L   AST 17 10 - 35 U/L   ALT 11 9 - 46 U/L  Hemoglobin A1c  Result Value Ref Range   Hgb A1c MFr Bld 8.1 (H) <5.7 % of total Hgb   Mean Plasma Glucose 186 (calc)   eAG (mmol/L) 10.3 (calc)   Complete Blood Count (Most recent): Lab Results  Component Value Date   WBC 7.3 12/25/2016   HGB 15.4 12/25/2016   HCT 46.0 12/25/2016   MCV 90.7 12/25/2016   PLT 200 12/25/2016   Chemistry (most recent): Lab Results  Component Value Date   NA 142 03/28/2018   K 4.5 03/28/2018   CL 105 03/28/2018   CO2 31 03/28/2018   BUN 21 03/28/2018   CREATININE 1.32 (H) 03/28/2018   Lipid Panel     Component Value Date/Time   CHOL 253 (H) 01/03/2018 0739   TRIG 207 (H) 01/03/2018 0739   HDL 39 (L) 01/03/2018 0739   CHOLHDL 6.5 (H) 01/03/2018 0739   VLDL 28 02/18/2016 0834   LDLCALC 177 (H) 01/03/2018 0739    Results for WPIERSON, VANTOL(MRN [id number] as of 01/25/2018 11:53  Ref. Range 06/25/2017 07:57 10/03/2017 07:40 01/03/2018 07:39  Hemoglobin A1C Latest  Ref Range: <5.7 % of total Hgb 9.2 (H) 7.4 (H) 9.0 (H)    Assessment & Plan:   1. Type 2 diabetes mellitus with vascular disease ([employer], CKD.  His diabetes is  complicated by recent coronary artery disease status post coronary artery bypass graft. - He came with  controlled fasting and postprandial blood glucose profile with adjusted basal insulin.  -He has no documented or reported hypoglycemia.   -His recent labs show improved A1c of 8.1% from 9%.   - Recent labs reviewed, showing stage 2 renal failure. - Patient remains at a high risk for more acute and chronic complications of diabetes which include CAD, CVA, CKD, retinopathy, and neuropathy. These are all discussed in detail with the patient.  - I have re-counseled the patient on diet management and   by adopting a carbohydrate restricted / protein rich  Diet. - Patient is advised to stick to a routine mealtimes to eat 3 meals  a day and avoid unnecessary snacks ( to snack only to correct hypoglycemia).  -  Suggestion is made for him to avoid simple carbohydrates  from his diet including Cakes, Sweet Desserts / Pastries, Ice Cream, Soda (diet and regular), Sweet Tea, Candies, Chips, Cookies, Store Bought Juices, Alcohol in Excess of  1-2 drinks a day, Artificial Sweeteners, and "Sugar-free" Products. This will help patient to have stable blood glucose profile and potentially avoid unintended weight gain.  - I have approached patient with the following individualized plan to manage diabetes and patient agrees.  -Based on his presentation with controlled glycemic profile, he will not require prandial insulin for  now.    -He is advised to increase his Toujeo to 44  units nightly, associated with strict monitoring of blood glucose 2 times a day-daily before breakfast and at bedtime.   - He is advised to call us if he registers blood glucose below 70 or above 200 x3.  -  He could not afford Januvia . - He does not tolerate metformin. -  Patient specific target  for A1c; LDL, HDL, Triglycerides, and  Waist Circumference were discussed in detail.  2) BP/HTN: His blood pressure is not controlled to target.  He is advised to continue his metoprolol 25 mg p.o. twice daily.    3) Lipids/HPL: His repeat labs show LDL is still significantly above target at 146.  He does not tolerate statins.  Patient with established coronary artery disease status post CABG.  Continue Zetia 10 mg by mouth daily at bedtime.  He is a good candidate for PCSK 9  inhibitor therapy, however he could not afford the $150 co-pay required.   4)  Weight/Diet: CDE consult in progress, exercise, and carbohydrates information provided.  5) Hypothyroidism:  -His thyroid function tests are consistent with appropriate replacement. -  I will continue levothyroxine 75 g by mouth every morning.    - We discussed about correct intake of levothyroxine, at fasting, with water, separated by at least 30 minutes from breakfast, and separated by more than 4 hours from calcium, iron, multivitamins, acid reflux medications (PPIs). -Patient is made aware of the fact that thyroid hormone replacement is needed for life, dose to be adjusted by periodic monitoring of thyroid function tests.  6) vitamin D deficiency. - I advised him to continue his vitamin D 2 2000 units daily.   7) Chronic Care/Health Maintenance:  -Patient is encouraged to continue to follow up with Ophthalmology, Podiatrist at least yearly or according to recommendations, and advised to  stay away from smoking. I have recommended yearly flu vaccine and pneumonia vaccination at least every 5 years; moderate intensity exercise for up to 150 minutes weekly; and  sleep for at least 7 hours a day.  I advised patient to maintain close follow up with   Sinda Du, M.D. for primary care needs.  - Time spent with the patient: 25 min, of which >50% was spent in reviewing his blood glucose logs , discussing his hypo-  and hyper-glycemic episodes, reviewing his current and  previous labs and insulin doses and developing a plan to avoid hypo- and hyper-glycemia. Please refer to Patient Instructions for Blood Glucose Monitoring and Insulin/Medications Dosing Guide"  in media tab for additional information. Hunter Sparks participated in the discussions, expressed understanding, and voiced agreement with the above plans.  All questions were answered to his satisfaction. he is encouraged to contact clinic should he have any questions or concerns prior to his return visit.   Follow up plan: Return in about 4 months (around 08/06/2018) for meter, and logs.  Glade Lloyd, MD Phone: [phone]  Fax: [phone]  This note was partially dictated with voice recognition software. Similar sounding words can be transcribed inadequately or may not  be corrected upon review.  04/05/2018, 10:20 AM

## 2018-04-05 NOTE — Patient Instructions (Signed)

## 2018-04-08 ENCOUNTER — Other Ambulatory Visit: Payer: Self-pay | Admitting: "Endocrinology

## 2018-04-09 ENCOUNTER — Other Ambulatory Visit: Payer: Self-pay | Admitting: "Endocrinology

## 2018-04-09 MED ORDER — GLUCOSE BLOOD VI STRP: 1.0000 | ORAL_STRIP | Freq: Four times a day (QID) | 5 refills | Status: DC

## 2018-04-10 ENCOUNTER — Other Ambulatory Visit: Payer: Self-pay

## 2018-04-10 MED ORDER — ONETOUCH VERIO W/DEVICE KIT: 1.0000 | PACK | Freq: Four times a day (QID) | 0 refills | Status: DC

## 2018-04-10 MED ORDER — LANCETS MISC: 1.0000 | Freq: Four times a day (QID) | 5 refills | Status: DC

## 2018-04-17 ENCOUNTER — Other Ambulatory Visit: Payer: Self-pay

## 2018-04-17 MED ORDER — GLUCOSE BLOOD VI STRP: 1.0000 | ORAL_STRIP | Freq: Four times a day (QID) | 5 refills | Status: DC

## 2018-04-21 ENCOUNTER — Other Ambulatory Visit: Payer: Self-pay | Admitting: Cardiology

## 2018-04-25 ENCOUNTER — Encounter: Payer: Self-pay | Admitting: Cardiology

## 2018-04-25 ENCOUNTER — Ambulatory Visit: Payer: PPO | Admitting: Cardiology

## 2018-04-25 VITALS — BP 132/70 | HR 58 | Ht 72.0 in | Wt 198.2 lb

## 2018-04-25 DIAGNOSIS — E782 Mixed hyperlipidemia: Secondary | ICD-10-CM

## 2018-04-25 DIAGNOSIS — I10 Essential (primary) hypertension: Secondary | ICD-10-CM | POA: Diagnosis not present

## 2018-04-25 DIAGNOSIS — I2510 Atherosclerotic heart disease of native coronary artery without angina pectoris: Secondary | ICD-10-CM

## 2018-04-25 MED ORDER — METOPROLOL TARTRATE 25 MG PO TABS: ORAL_TABLET | ORAL | 3 refills | Status: DC

## 2018-04-25 MED ORDER — FUROSEMIDE 20 MG PO TABS: 20.0000 mg | ORAL_TABLET | Freq: Every day | ORAL | 3 refills | Status: DC

## 2018-04-25 MED ORDER — FUROSEMIDE 20 MG PO TABS: 20.0000 mg | ORAL_TABLET | Freq: Every day | ORAL | 3 refills | Status: DC | PRN

## 2018-04-25 MED ORDER — EZETIMIBE 10 MG PO TABS: 10.0000 mg | ORAL_TABLET | Freq: Every day | ORAL | 3 refills | Status: DC

## 2018-04-25 NOTE — Progress Notes (Signed)
Clinical Summary [name] is a 72 y.o.male seen today for f/u of the following medical problems.   1. CAD - s/p CABG 06/2015 (LIMA-LAD,SVG-D1,SVG-OM2,SVG-PDA) - 05/2015 echo LVEF 60-65%, no WMAs, grade I diastolic dysfunction   - no recent chest pain. Some SOB at times. Can have some LE edema at times. Occasioanl cough at times.  - compliant with meds.    2. Hyperlipidemia - intolerant to statins due to muscle aches. Tried 3 different statins (pravastatin,lipitor,zocor)  - 12/2017 TC 253 HDL 39 TG 207 LDL 177  3. HTN - low bp's previously, his norvasc was stopped.  - lisionpril stopped previously due to renal insufficiency  4. History of tobacco abuse - normal CT abd Jan 2014 , no evidence of AAA    SH: upcoming family trip to [facility].      Past Medical History:  Diagnosis Date  . Angina, class II ([place])   . Arthritis    "back, hips, legs" (06/10/2015)  . Chest pain   . Chronic lower back pain   . Depression   . DJD (degenerative joint disease)   . Hyperlipidemia 06/11/2015  . Hypertension   . Hypothyroidism   . Kidney stone   . Type II diabetes mellitus ([facility])      Allergies  Allergen Reactions  . Penicillins Rash    Has patient had a PCN reaction causing immediate rash, facial/tongue/throat swelling, SOB or lightheadedness with hypotension: Yes Has patient had a PCN reaction causing severe rash involving mucus membranes or skin necrosis: No Has patient had a PCN reaction that required hospitalization: No Has patient had a PCN reaction occurring within the last 10 years: No If all of the above answers are "NO", then may proceed with Cephalosporin use.      Current Outpatient Medications  Medication Sig Dispense Refill  . aspirin EC 81 MG tablet Take 1 tablet (81 mg total) daily by mouth.    . blood glucose meter kit and supplies KIT Dispense based on patient and insurance preference. Use up to four times daily as directed. (FOR ICD-10  E11.65) 1 each 0  . Blood Glucose Monitoring Suppl (ONETOUCH VERIO) w/Device KIT 1 each by Does not apply route 4 (four) times daily. 1 kit 0  . Cholecalciferol (VITAMIN D3) 1000 units CAPS Take 2,000 Units by mouth daily.    Marland Kitchen glucose blood test strip 1 each by Other route 4 (four) times daily. Use as instructed 4 x daily. E11.65 one touch Verio 150 each 5  . Insulin Glargine (TOUJEO SOLOSTAR) 300 UNIT/ML SOPN Inject 44 Units into the skin at bedtime. 6 mL 2  . Insulin Pen Needle (B-D ULTRAFINE III SHORT PEN) 31G X 8 MM MISC 1 each by Does not apply route as directed. 100 each 3  . Lancets MISC 1 each by Does not apply route 4 (four) times daily. 150 each 5  . levothyroxine (SYNTHROID, LEVOTHROID) 75 MCG tablet TAKE 1 TABLET BY MOUTH ONCE DAILY BEFORE BREAKFAST 90 tablet 1  . metoprolol tartrate (LOPRESSOR) 25 MG tablet TAKE 1 TABLET BY MOUTH TWICE DAILY **PATIENT  NEEDS  OFFICE  VISIT** 14 tablet 0  . TOUJEO SOLOSTAR 300 UNIT/ML SOPN INJECT 30 UNITS SUBCUTANEOUSLY AT BEDTIME 4.5 mL 2  . traMADol (ULTRAM) 50 MG tablet Take 1 tablet (50 mg total) by mouth every 6 (six) hours as needed. pain 30 tablet 0   No current facility-administered medications for this visit.      Past  Surgical History:  Procedure Laterality Date  . CARDIAC CATHETERIZATION N/A 06/11/2015   Procedure: Left Heart Cath and Coronary Angiography;  Surgeon: Belva Crome, MD;  Location: [facility];  Service: Cardiovascular;  Laterality: N/A;  . COLONOSCOPY  06/21/2011   Procedure: COLONOSCOPY;  Surgeon: Rogene Houston, MD;  [facility];  Service: Endoscopy;  Laterality: N/A;  . COLONOSCOPY    . COLONOSCOPY N/A 09/20/2017   Procedure: COLONOSCOPY;  Surgeon: Rogene Houston, MD;  Location[facility];  Service: Endoscopy;  Laterality: N/A;  1200  . CORONARY ARTERY BYPASS GRAFT N/A 06/14/2015   Procedure: CORONARY ARTERY BYPASS GRAFTING times four using Left Internal mammary artery and right leg Saphenous  vein graft;  Surgeon: Ivin Poot, MD;  Location: [facility];  Service: Open Heart Surgery;  Laterality: N/A;  . 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 HAND SURGERY    . SKIN CANCER EXCISION Left    "forearm"  . TEE WITHOUT CARDIOVERSION N/A 06/14/2015   Procedure: TRANSESOPHAGEAL ECHOCARDIOGRAM (TEE);  Surgeon: Ivin Poot, MD;  Location: [facility];  Service: Open Heart Surgery;  Laterality: N/A;     Allergies  Allergen Reactions  . Penicillins Rash    Has patient had a PCN reaction causing immediate rash, facial/tongue/throat swelling, SOB or lightheadedness with hypotension: Yes Has patient had a PCN reaction causing severe rash involving mucus membranes or skin necrosis: No Has patient had a PCN reaction that required hospitalization: No Has patient had a PCN reaction occurring within the last 10 years: No If all of the above answers are "NO", then may proceed with Cephalosporin use.       Family History  Problem Relation Age of Onset  . Stroke Mother   . Hypertension Mother   . Stomach cancer Father   . Liver cancer Father   . Cancer Father   . Rectal cancer Brother   . Cancer Brother   . Heart attack Neg Hx      Social History [name] reports that he quit smoking about 23 years ago. His smoking use included cigarettes. He has a 141.00 pack-year smoking history. His smokeless tobacco use includes chew. [name] reports that he does not drink alcohol.   Review of Systems CONSTITUTIONAL: No weight loss, fever, chills, weakness or fatigue.  HEENT: Eyes: No visual loss, blurred vision, double vision or yellow sclerae.No hearing loss, sneezing, congestion, runny nose or sore throat.  SKIN: No rash or itching.  CARDIOVASCULAR: per hpi RESPIRATORY: per hpi  GASTROINTESTINAL: No anorexia, nausea, vomiting or diarrhea. No abdominal pain or blood.  GENITOURINARY: No burning on urination, no polyuria NEUROLOGICAL: No headache, dizziness, syncope, paralysis,  ataxia, numbness or tingling in the extremities. No change in bowel or bladder control.  MUSCULOSKELETAL: No muscle, back pain, joint pain or stiffness.  LYMPHATICS: No enlarged nodes. No history of splenectomy.  PSYCHIATRIC: No history of depression or anxiety.  ENDOCRINOLOGIC: No reports of sweating, cold or heat intolerance. No polyuria or polydipsia.  Marland Kitchen   Physical Examination Vitals:   04/25/18 1036  BP: 132/70  Pulse: (!) 58   Vitals:   04/25/18 1036  Weight: 198 lb 3.2 oz (89.9 kg)  Height: 6' (1.829 m)    Gen: resting comfortably, no acute distress HEENT: no scleral icterus, pupils equal round and reactive, no palptable cervical adenopathy,  CV: RRR, no m/r/g,no jvd Resp: Clear to auscultation bilaterally GI: abdomen is soft, non-tender, non-distended, normal bowel sounds, no  hepatosplenomegaly MSK: extremities are warm, no edema.  Skin: warm, no rash Neuro:  no focal deficits Psych: appropriate affect   Diagnostic Studies 05/2015 echo Study Conclusions  - Left ventricle: The cavity size was normal. Wall thickness was normal. Systolic function was normal. The estimated ejection fraction was in the range of 60% to 65%. Wall motion was normal; there were no regional wall motion abnormalities. Doppler parameters are consistent with abnormal left ventricular relaxation (grade 1 diastolic dysfunction). - Aortic valve: Mildly to moderately calcified annulus. Trileaflet. - Mitral valve: Mildly calcified annulus. Mildly calcified leaflets . There was trivial regurgitation. - Tricuspid valve: There was mild regurgitation. - Pulmonary arteries: PA peak pressure: 26 mm Hg (S). - Systemic veins: IVC dilated with normal respiratory variation. Estimated CVP 8 mmHg.   05/2015 cath 1. Prox LAD to Mid LAD lesion, 95% stenosed. 2. 2nd Diag lesion, 70% stenosed. 3. 3rd Diag lesion, 90% stenosed. 4. Mid Cx lesion, 99% stenosed. 5. Dist Cx lesion, 40%  stenosed. 6. Dist RCA lesion, 90% stenosed. 7. The left ventricular systolic function is normal.   Severe three-vessel coronary disease involving the proximal LAD, mid circumflex, and distal RCA in a diabetic.  Normal left ventricular function  Clinical presentation with unstable angina   05/2015 Carotid US Summary:  - Bilateral - 1% to 39% ICA stenosis. Vertebral artery flow is  antegrade. - Palmar arch evaluation - Doppler waveforms remained normal  bilaterally with both radial and ulnar compressions. - ABIs and Doppler waveforms are within normal limits bilaterally  at rest.      Assessment and Plan  1. CAD -- no specific cardiac recent symptoms, continue current meds - some recent edema at times, start lasix 20m prn.   2. Hyperlipidemia - above goal, intolerant to statins - start zetia 113mdaily.   3. HTN - bp at goal, continue currnent meds    F/u 6 months   JoArnoldo LenisM.D.

## 2018-04-25 NOTE — Patient Instructions (Signed)
Medication Instructions:  Your physician has recommended you make the following change in your medication:  Start Taking Zetia 10 mg Daily Start Lasix 20 mg Daily As Needed for Swelling    Labwork: NONE   Testing/Procedures: NONE   Follow-Up: Your physician wants you to follow-up in: 6 Months. You will receive a reminder letter in the mail two months in advance. If you don't receive a letter, please call our office to schedule the follow-up appointment.   Any Other Special Instructions Will Be Listed Below (If Applicable).     If you need a refill on your cardiac medications before your next appointment, please call your pharmacy.

## 2018-05-02 ENCOUNTER — Encounter: Payer: Self-pay | Admitting: Cardiology

## 2018-06-24 ENCOUNTER — Other Ambulatory Visit: Payer: Self-pay

## 2018-06-24 MED ORDER — INSULIN GLARGINE 300 UNIT/ML ~~LOC~~ SOPN: 44.0000 [IU] | PEN_INJECTOR | Freq: Every day | SUBCUTANEOUS | 2 refills | Status: DC

## 2018-07-16 ENCOUNTER — Encounter: Payer: Self-pay | Admitting: "Endocrinology

## 2018-07-26 ENCOUNTER — Encounter: Payer: Self-pay | Admitting: "Endocrinology

## 2018-07-31 DIAGNOSIS — E1159 Type 2 diabetes mellitus with other circulatory complications: Secondary | ICD-10-CM | POA: Diagnosis not present

## 2018-07-31 DIAGNOSIS — E039 Hypothyroidism, unspecified: Secondary | ICD-10-CM | POA: Diagnosis not present

## 2018-08-01 LAB — COMPLETE METABOLIC PANEL WITH GFR
AG Ratio: 1.6 (calc) (ref 1.0–2.5)
ALBUMIN MSPROF: 4.1 g/dL (ref 3.6–5.1)
ALT: 14 U/L (ref 9–46)
AST: 17 U/L (ref 10–35)
Alkaline phosphatase (APISO): 68 U/L (ref 40–115)
BILIRUBIN TOTAL: 0.5 mg/dL (ref 0.2–1.2)
BUN / CREAT RATIO: 14 (calc) (ref 6–22)
BUN: 17 mg/dL (ref 7–25)
CO2: 30 mmol/L (ref 20–32)
CREATININE: 1.2 mg/dL — AB (ref 0.70–1.18)
Calcium: 9.5 mg/dL (ref 8.6–10.3)
Chloride: 105 mmol/L (ref 98–110)
GFR, EST AFRICAN AMERICAN: 69 mL/min/{1.73_m2} (ref >=60)
GFR, Est Non African American: 59 mL/min/{1.73_m2} — ABNORMAL LOW (ref >=60)
Globulin: 2.6 g/dL (calc) (ref 1.9–3.7)
Glucose, Bld: 139 mg/dL — ABNORMAL HIGH (ref 65–99)
Potassium: 4.3 mmol/L (ref 3.5–5.3)
Sodium: 141 mmol/L (ref 135–146)
TOTAL PROTEIN: 6.7 g/dL (ref 6.1–8.1)

## 2018-08-01 LAB — HEMOGLOBIN A1C
HEMOGLOBIN A1C: 8 %{Hb} — AB (ref <5.7)
Mean Plasma Glucose: 183 (calc)
eAG (mmol/L): 10.1 (calc)

## 2018-08-01 LAB — MICROALBUMIN / CREATININE URINE RATIO
CREATININE, URINE: 115 mg/dL (ref 20–320)
Microalb Creat Ratio: 16 mcg/mg creat (ref <30)
Microalb, Ur: 1.8 mg/dL

## 2018-08-01 LAB — TSH: TSH: 2.24 mIU/L (ref 0.40–4.50)

## 2018-08-01 LAB — T4, FREE: Free T4: 1 ng/dL (ref 0.8–1.8)

## 2018-08-08 ENCOUNTER — Ambulatory Visit (INDEPENDENT_AMBULATORY_CARE_PROVIDER_SITE_OTHER): Payer: PPO | Admitting: "Endocrinology

## 2018-08-08 ENCOUNTER — Encounter: Payer: Self-pay | Admitting: "Endocrinology

## 2018-08-08 VITALS — BP 139/71 | HR 78 | Ht 74.0 in | Wt 195.0 lb

## 2018-08-08 DIAGNOSIS — I10 Essential (primary) hypertension: Secondary | ICD-10-CM | POA: Diagnosis not present

## 2018-08-08 DIAGNOSIS — E1159 Type 2 diabetes mellitus with other circulatory complications: Secondary | ICD-10-CM

## 2018-08-08 DIAGNOSIS — E039 Hypothyroidism, unspecified: Secondary | ICD-10-CM

## 2018-08-08 DIAGNOSIS — E782 Mixed hyperlipidemia: Secondary | ICD-10-CM | POA: Diagnosis not present

## 2018-08-08 MED ORDER — INSULIN GLARGINE 300 UNIT/ML ~~LOC~~ SOPN: 48.0000 [IU] | PEN_INJECTOR | Freq: Every day | SUBCUTANEOUS | 5 refills | Status: DC

## 2018-08-08 NOTE — Progress Notes (Signed)
Endocrinology follow-up note   Subjective:    Patient ID: Hunter Sparks, male    DOB: [birth year],    Past Medical History:  Diagnosis Date  . Angina, class II ([place])   . Arthritis    "back, hips, legs" (06/10/2015)  . Chest pain   . Chronic lower back pain   . Depression   . DJD (degenerative joint disease)   . Hyperlipidemia 06/11/2015  . Hypertension   . Hypothyroidism   . Kidney stone   . Type II diabetes mellitus ([place])    Past Surgical History:  Procedure Laterality Date  . CARDIAC CATHETERIZATION N/A 06/11/2015   Procedure: Left Heart Cath and Coronary Angiography;  Surgeon: Belva Crome, MD;  Location: [facility];  Service: Cardiovascular;  Laterality: N/A;  . COLONOSCOPY  06/21/2011   Procedure: COLONOSCOPY;  Surgeon: Rogene Houston, MD;  [facility]: [facility];  Service: Endoscopy;  Laterality: N/A;  . COLONOSCOPY    . COLONOSCOPY N/A 09/20/2017   Procedure: COLONOSCOPY;  Surgeon: Rogene Houston, MD;  Location: [facility];  Service: Endoscopy;  Laterality: N/A;  1200  . CORONARY ARTERY BYPASS GRAFT N/A 06/14/2015   Procedure: CORONARY ARTERY BYPASS GRAFTING times four using Left Internal mammary artery and right leg Saphenous vein graft;  Surgeon: Ivin Poot, MD;  Location: [facility];  Service: Open Heart Surgery;  Laterality: N/A;  . 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 HAND SURGERY    . SKIN CANCER EXCISION Left    "forearm"  . TEE WITHOUT CARDIOVERSION N/A 06/14/2015   Procedure: TRANSESOPHAGEAL ECHOCARDIOGRAM (TEE);  Surgeon: Ivin Poot, MD;  Location: [facility];  Service: Open Heart Surgery;  Laterality: N/A;   Social History   Socioeconomic History  . Marital status: Married    Spouse name: Not on file  . Number of children: Not on file  . Years of education: Not on file  . Highest education level: Not on file  Occupational History  . Not on file  Social Needs  . Financial resource strain: Not on file  . Food insecurity:     Worry: Not on file    Inability: Not on file  . Transportation needs:    Medical: Not on file    Non-medical: Not on file  Tobacco Use  . Smoking status: Former Smoker    Packs/day: 3.00    Years: 47.00    Pack years: 141.00    Types: Cigarettes    Last attempt to quit: 11/20/1994    Years since quitting: 23.7  . Smokeless tobacco: Current User    Types: Chew  Substance and Sexual Activity  . Alcohol use: No    Alcohol/week: 0.0 standard drinks  . Drug use: No  . Sexual activity: Never    Partners: Female  Lifestyle  . Physical activity:    Days per week: Not on file    Minutes per session: Not on file  . Stress: Not on file  Relationships  . Social connections:    Talks on phone: Not on file    Gets together: Not on file    Attends religious service: Not on file    Active member of club or organization: Not on file    Attends meetings of clubs or organizations: Not on file    Relationship status: Not on file  Other Topics Concern  . Not on file  Social History Narrative  . Not on file   Outpatient  Encounter Medications as of 08/08/2018  Medication Sig  . aspirin EC 81 MG tablet Take 1 tablet (81 mg total) daily by mouth.  . blood glucose meter kit and supplies KIT Dispense based on patient and insurance preference. Use up to four times daily as directed. (FOR ICD-10 E11.65)  . Blood Glucose Monitoring Suppl (ONETOUCH VERIO) w/Device KIT 1 each by Does not apply route 4 (four) times daily.  Marland Kitchen ezetimibe (ZETIA) 10 MG tablet Take 1 tablet (10 mg total) by mouth daily.  . furosemide (LASIX) 20 MG tablet Take 1 tablet (20 mg total) by mouth daily as needed for edema.  Marland Kitchen glucose blood test strip 1 each by Other route 4 (four) times daily. Use as instructed 4 x daily. E11.65 one touch Verio  . Insulin Glargine (TOUJEO SOLOSTAR) 300 UNIT/ML SOPN Inject 48 Units into the skin at bedtime.  . Insulin Pen Needle (B-D ULTRAFINE III SHORT PEN) 31G X 8 MM MISC 1 each by Does not apply  route as directed.  . Lancets MISC 1 each by Does not apply route 4 (four) times daily.  Marland Kitchen levothyroxine (SYNTHROID, LEVOTHROID) 75 MCG tablet TAKE 1 TABLET BY MOUTH ONCE DAILY BEFORE BREAKFAST  . metoprolol tartrate (LOPRESSOR) 25 MG tablet Take 1 Tablet Two Times Daily  . traMADol (ULTRAM) 50 MG tablet Take 1 tablet (50 mg total) by mouth every 6 (six) hours as needed. pain  . [DISCONTINUED] Insulin Glargine (TOUJEO SOLOSTAR) 300 UNIT/ML SOPN Inject 44 Units into the skin at bedtime.   No facility-administered encounter medications on file as of 08/08/2018.    ALLERGIES: Allergies  Allergen Reactions  . Penicillins Rash    Has patient had a PCN reaction causing immediate rash, facial/tongue/throat swelling, SOB or lightheadedness with hypotension: Yes Has patient had a PCN reaction causing severe rash involving mucus membranes or skin necrosis: No Has patient had a PCN reaction that required hospitalization: No Has patient had a PCN reaction occurring within the last 10 years: No If all of the above answers are "NO", then may proceed with Cephalosporin use.    VACCINATION STATUS:  There is no immunization history on file for this patient.  Diabetes  He presents for his follow-up diabetic visit. He has type 2 diabetes mellitus. Onset time: He was diagnosed at approximate age of 81 years. His disease course has been improving. There are no hypoglycemic associated symptoms. Pertinent negatives for hypoglycemia include no confusion, headaches, pallor or seizures. Associated symptoms include fatigue. Pertinent negatives for diabetes include no chest pain, no polydipsia, no polyphagia, no polyuria and no weakness. There are no hypoglycemic complications. Symptoms are improving. Diabetic complications include heart disease. Risk factors for coronary artery disease include dyslipidemia, diabetes mellitus, hypertension, male sex, sedentary lifestyle and tobacco exposure. Current diabetic treatment  includes oral agent (monotherapy). He is compliant with treatment most of the time. His weight is fluctuating minimally. He is following a generally unhealthy diet. He has had a previous visit with a dietitian. He participates in exercise intermittently. His breakfast blood glucose range is generally 140-180 mg/dl. His bedtime blood glucose range is generally 180-200 mg/dl. His overall blood glucose range is 180-200 mg/dl. (He returns with near target fasting blood glucose profile, above target bedtime blood glucose readings.  His A1c is 8%, progressively improving from 9%.     ) An ACE inhibitor/angiotensin II receptor blocker is not being taken.  Hypertension  This is a chronic problem. The current episode started more than 1 year ago.  The problem is uncontrolled. Pertinent negatives include no chest pain, headaches, neck pain, palpitations or shortness of breath. Risk factors for coronary artery disease include diabetes mellitus, dyslipidemia, sedentary lifestyle, smoking/tobacco exposure and male gender. Past treatments include beta blockers. Hypertensive end-organ damage includes kidney disease and CAD/MI.  Hyperlipidemia  This is a chronic problem. The current episode started more than 1 year ago. The problem is uncontrolled. Exacerbating diseases include diabetes. Pertinent negatives include no chest pain, myalgias or shortness of breath. Current antihyperlipidemic treatment includes ezetimibe (He does not tolerate statins, he could not afford the co-pay for Repatha.). The current treatment provides no improvement (His LDL remains significantly above target at 146 , he has history of significant statin intolerance.) of lipids. Risk factors for coronary artery disease include diabetes mellitus, dyslipidemia, a sedentary lifestyle and male sex.    Review of Systems  Constitutional: Positive for fatigue. Negative for unexpected weight change.  HENT: Negative for dental problem, mouth sores and  trouble swallowing.   Eyes: Negative for visual disturbance.  Respiratory: Negative for cough, choking, chest tightness, shortness of breath and wheezing.   Cardiovascular: Negative for chest pain, palpitations and leg swelling.        He recently  underwent quadruple coronary artery bypass graft.  Gastrointestinal: Negative for abdominal distention, abdominal pain, constipation, diarrhea, nausea and vomiting.  Endocrine: Negative for polydipsia, polyphagia and polyuria.  Genitourinary: Negative for dysuria, flank pain, hematuria and urgency.  Musculoskeletal: Negative for back pain, gait problem, myalgias and neck pain.  Skin: Negative for pallor, rash and wound.  Neurological: Negative for seizures, syncope, weakness, numbness and headaches.  Psychiatric/Behavioral: Negative.  Negative for confusion and dysphoric mood.    Objective:    BP 139/71   Pulse 78   Wt 195 lb (88.5 kg)   BMI 26.45 kg/m   Wt Readings from Last 3 Encounters:  08/08/18 195 lb (88.5 kg)  04/25/18 198 lb 3.2 oz (89.9 kg)  04/05/18 195 lb 12.8 oz (88.8 kg)    Physical Exam  Constitutional: He is oriented to person, place, and time. He appears well-developed. He is cooperative. No distress.  HENT:  Head: Normocephalic and atraumatic.  Eyes: EOM are normal.  Neck: Normal range of motion. Neck supple. No tracheal deviation present. No thyromegaly present.  Cardiovascular: Normal rate, S1 normal and S2 normal. Exam reveals no gallop.  No murmur heard. Pulses:      Dorsalis pedis pulses are 1+ on the right side, and 1+ on the left side.       Posterior tibial pulses are 1+ on the right side, and 1+ on the left side.  Healed sternotomy surgical wound for CABG.  Pulmonary/Chest: Effort normal. No respiratory distress. He has no wheezes.  Abdominal: He exhibits no distension. There is no tenderness. There is no guarding and no CVA tenderness.  + Obese abdomen  Musculoskeletal: He exhibits no edema.       Right  shoulder: He exhibits no swelling and no deformity.  Neurological: He is alert and oriented to person, place, and time. He has normal strength and normal reflexes. No cranial nerve deficit or sensory deficit. Gait normal.  Skin: Skin is warm and dry. No rash noted. No cyanosis. Nails show no clubbing.  Psychiatric: He has a normal mood and affect. His speech is normal. Judgment normal. Cognition and memory are normal.    Results for orders placed or performed in visit on 04/05/18  COMPLETE METABOLIC PANEL WITH GFR  Result Value  Ref Range   Glucose, Bld 139 (H) 65 - 99 mg/dL   BUN 17 7 - 25 mg/dL   Creat 1.20 (H) 0.70 - 1.18 mg/dL   GFR, Est Non African American 59 (L) > OR = 60 mL/min/1.43m   GFR, Est African American 69 > OR = 60 mL/min/1.720m  BUN/Creatinine Ratio 14 6 - 22 (calc)   Sodium 141 135 - 146 mmol/L   Potassium 4.3 3.5 - 5.3 mmol/L   Chloride 105 98 - 110 mmol/L   CO2 30 20 - 32 mmol/L   Calcium 9.5 8.6 - 10.3 mg/dL   Total Protein 6.7 6.1 - 8.1 g/dL   Albumin 4.1 3.6 - 5.1 g/dL   Globulin 2.6 1.9 - 3.7 g/dL (calc)   AG Ratio 1.6 1.0 - 2.5 (calc)   Total Bilirubin 0.5 0.2 - 1.2 mg/dL   Alkaline phosphatase (APISO) 68 40 - 115 U/L   AST 17 10 - 35 U/L   ALT 14 9 - 46 U/L  Hemoglobin A1c  Result Value Ref Range   Hgb A1c MFr Bld 8.0 (H) <5.7 % of total Hgb   Mean Plasma Glucose 183 (calc)   eAG (mmol/L) 10.1 (calc)  Microalbumin / creatinine urine ratio  Result Value Ref Range   Creatinine, Urine 115 20 - 320 mg/dL   Microalb, Ur 1.8 mg/dL   Microalb Creat Ratio 16 <30 mcg/mg creat  TSH  Result Value Ref Range   TSH 2.24 0.40 - 4.50 mIU/L  T4, free  Result Value Ref Range   Free T4 1.0 0.8 - 1.8 ng/dL   Lipid Panel     Component Value Date/Time   CHOL 253 (H) 01/03/2018 0739   TRIG 207 (H) 01/03/2018 0739   HDL 39 (L) 01/03/2018 0739   CHOLHDL 6.5 (H) 01/03/2018 0739   VLDL 28 02/18/2016 0834   LDLCALC 177 (H) 01/03/2018 0739      Assessment &  Plan:   1. 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  His diabetes is  complicated by recent coronary artery disease status post coronary artery bypass graft. - He came with  controlled fasting and above target postprandial blood glucose profile with adjusted basal insulin.  -He has no documented or reported hypoglycemia.   -His recent labs show improved A1c of 8%, progressively improving from 9%.   - Recent labs reviewed, showing stage 2 renal failure. - Patient remains at a high risk for more acute and chronic complications of diabetes which include CAD, CVA, CKD, retinopathy, and neuropathy. These are all discussed in detail with the patient.  - I have re-counseled the patient on diet management and   by adopting a carbohydrate restricted / protein rich  Diet. - Patient is advised to stick to a routine mealtimes to eat 3 meals  a day and avoid unnecessary snacks ( to snack only to correct hypoglycemia).  -  Suggestion is made for him to avoid simple carbohydrates  from his diet including Cakes, Sweet Desserts / Pastries, Ice Cream, Soda (diet and regular), Sweet Tea, Candies, Chips, Cookies, Store Bought Juices, Alcohol in Excess of  1-2 drinks a day, Artificial Sweeteners, and "Sugar-free" Products. This will help patient to have stable blood glucose profile and potentially avoid unintended weight gain.   - I have approached patient with the following individualized plan to manage diabetes and patient agrees.  -Based on his presentation with controlled glycemic profile, he will not require prandial insulin for now.    -  He is advised to increase Toujeo to 48 units nightly,  associated with strict monitoring of blood glucose 2 times a day-daily before breakfast and at bedtime.   - He is advised to call us if he registers blood glucose below 70 or above 200 x3.  -  He could not afford Januvia . - He does not tolerate metformin. - Patient specific target  for A1c; LDL, HDL,  Triglycerides, and  Waist Circumference were discussed in detail.  2) BP/HTN: His blood pressure is controlled to target.   He is advised to continue his metoprolol 25 mg p.o. twice daily.    3) Lipids/HPL: His repeat labs show LDL is still significantly above target at 146.  He does not tolerate statins.  Patient with established coronary artery disease status post CABG. he is tolerating Zetia.  He is advised to continue Zetia 10 mg by mouth daily at bedtime.  He is a good candidate for PCSK 9  inhibitor therapy, however he could not afford the $150 co-pay required.   4)  Weight/Diet: CDE consult in progress, exercise, and carbohydrates information provided.  5) Hypothyroidism:  -His thyroid function tests are consistent with appropriate replacement. -  I will continue levothyroxine 75 g by mouth every morning.    - We discussed about correct intake of levothyroxine, at fasting, with water, separated by at least 30 minutes from breakfast, and separated by more than 4 hours from calcium, iron, multivitamins, acid reflux medications (PPIs). -Patient is made aware of the fact that thyroid hormone replacement is needed for life, dose to be adjusted by periodic monitoring of thyroid function tests.   6) vitamin D deficiency. - I advised him to continue his vitamin D 2 2000 units daily.   7) Chronic Care/Health Maintenance:  -Patient is encouraged to continue to follow up with Ophthalmology, Podiatrist at least yearly or according to recommendations, and advised to  stay away from smoking. I have recommended yearly flu vaccine and pneumonia vaccination at least every 5 years; moderate intensity exercise for up to 150 minutes weekly; and  sleep for at least 7 hours a day.  I advised patient to maintain close follow up with   Sinda Du, M.D. for primary care needs.  - Time spent with the patient: 25 min, of which >50% was spent in reviewing his blood glucose logs , discussing his hypo- and  hyper-glycemic episodes, reviewing his current and  previous labs and insulin doses and developing a plan to avoid hypo- and hyper-glycemia. Please refer to Patient Instructions for Blood Glucose Monitoring and Insulin/Medications Dosing Guide"  in media tab for additional information. Hunter Sparks participated in the discussions, expressed understanding, and voiced agreement with the above plans.  All questions were answered to his satisfaction. he is encouraged to contact clinic should he have any questions or concerns prior to his return visit.   Follow up plan: Return in about 6 months (around 02/06/2019) for Follow up with Pre-visit Labs, Meter, and Logs.  Glade Lloyd, MD Phone: [phone]  Fax: [phone]  This note was partially dictated with voice recognition software. Similar sounding words can be transcribed inadequately or may not  be corrected upon review.  08/08/2018, 1:10 PM

## 2018-08-08 NOTE — Patient Instructions (Signed)

## 2018-08-09 ENCOUNTER — Ambulatory Visit: Payer: PPO | Admitting: "Endocrinology

## 2018-08-27 ENCOUNTER — Other Ambulatory Visit: Payer: Self-pay | Admitting: "Endocrinology

## 2018-08-29 ENCOUNTER — Other Ambulatory Visit: Payer: Self-pay

## 2018-08-29 MED ORDER — INSULIN GLARGINE 300 UNIT/ML ~~LOC~~ SOPN: 48.0000 [IU] | PEN_INJECTOR | Freq: Every day | SUBCUTANEOUS | 5 refills | Status: DC

## 2018-09-24 ENCOUNTER — Other Ambulatory Visit: Payer: Self-pay

## 2018-09-24 MED ORDER — INSULIN GLARGINE (1 UNIT DIAL) 300 UNIT/ML ~~LOC~~ SOPN: 48.0000 [IU] | PEN_INJECTOR | Freq: Every day | SUBCUTANEOUS | 2 refills | Status: DC

## 2018-10-16 DIAGNOSIS — E119 Type 2 diabetes mellitus without complications: Secondary | ICD-10-CM | POA: Diagnosis not present

## 2019-02-06 ENCOUNTER — Ambulatory Visit: Payer: PPO | Admitting: "Endocrinology

## 2019-02-25 ENCOUNTER — Other Ambulatory Visit: Payer: Self-pay | Admitting: "Endocrinology

## 2019-02-28 ENCOUNTER — Other Ambulatory Visit: Payer: Self-pay | Admitting: "Endocrinology

## 2019-03-03 ENCOUNTER — Other Ambulatory Visit: Payer: Self-pay | Admitting: "Endocrinology

## 2019-04-21 ENCOUNTER — Ambulatory Visit: Payer: PPO | Admitting: "Endocrinology

## 2019-05-07 DIAGNOSIS — L738 Other specified follicular disorders: Secondary | ICD-10-CM | POA: Diagnosis not present

## 2019-05-07 DIAGNOSIS — L720 Epidermal cyst: Secondary | ICD-10-CM | POA: Diagnosis not present

## 2019-05-07 DIAGNOSIS — Z85828 Personal history of other malignant neoplasm of skin: Secondary | ICD-10-CM | POA: Diagnosis not present

## 2019-05-14 ENCOUNTER — Other Ambulatory Visit: Payer: Self-pay | Admitting: Cardiology

## 2019-05-14 ENCOUNTER — Other Ambulatory Visit: Payer: Self-pay | Admitting: "Endocrinology

## 2019-05-20 ENCOUNTER — Other Ambulatory Visit: Payer: Self-pay | Admitting: "Endocrinology

## 2019-06-02 ENCOUNTER — Other Ambulatory Visit: Payer: Self-pay | Admitting: "Endocrinology

## 2019-06-18 ENCOUNTER — Other Ambulatory Visit: Payer: Self-pay | Admitting: "Endocrinology

## 2019-06-18 DIAGNOSIS — E559 Vitamin D deficiency, unspecified: Secondary | ICD-10-CM

## 2019-06-18 DIAGNOSIS — E1159 Type 2 diabetes mellitus with other circulatory complications: Secondary | ICD-10-CM

## 2019-06-18 DIAGNOSIS — E039 Hypothyroidism, unspecified: Secondary | ICD-10-CM

## 2019-07-24 ENCOUNTER — Ambulatory Visit (INDEPENDENT_AMBULATORY_CARE_PROVIDER_SITE_OTHER): Payer: PPO | Admitting: "Endocrinology

## 2019-07-24 ENCOUNTER — Other Ambulatory Visit: Payer: Self-pay

## 2019-07-24 ENCOUNTER — Encounter: Payer: Self-pay | Admitting: "Endocrinology

## 2019-07-24 DIAGNOSIS — E1159 Type 2 diabetes mellitus with other circulatory complications: Secondary | ICD-10-CM

## 2019-07-24 DIAGNOSIS — E039 Hypothyroidism, unspecified: Secondary | ICD-10-CM

## 2019-07-24 DIAGNOSIS — E559 Vitamin D deficiency, unspecified: Secondary | ICD-10-CM

## 2019-07-24 DIAGNOSIS — E782 Mixed hyperlipidemia: Secondary | ICD-10-CM

## 2019-07-24 DIAGNOSIS — I10 Essential (primary) hypertension: Secondary | ICD-10-CM | POA: Diagnosis not present

## 2019-07-24 MED ORDER — INSULIN GLARGINE (1 UNIT DIAL) 300 UNIT/ML ~~LOC~~ SOPN: 50.0000 [IU] | PEN_INJECTOR | Freq: Every day | SUBCUTANEOUS | 2 refills | Status: DC

## 2019-07-24 NOTE — Progress Notes (Signed)
07/25/2019                                                    Endocrinology Telehealth Visit Follow up Note -During COVID -19 Pandemic  This visit type was conducted due to national recommendations for restrictions regarding the COVID-19 Pandemic  in an effort to limit this patient's exposure and mitigate transmission of the corona virus.  Due to his co-morbid illnesses, Hunter Sparks is at  moderate to high risk for complications without adequate follow up.  This format is felt to be most appropriate for him at this time.  I connected with this patient on 07/25/2019   by telephone and verified that I am speaking with the correct person using two identifiers. Hunter Sparks, 07/06/1946. he has verbally consented to this visit. All issues noted in this document were discussed and addressed. The format was not optimal for physical exam.    Subjective:    Patient ID: Hunter Sparks, male    DOB: 09-11-1946,    Past Medical History:  Diagnosis Date  . Angina, class II ([place])   . Arthritis    "back, hips, legs" (06/10/2015)  . Chest pain   . Chronic lower back pain   . Depression   . DJD (degenerative joint disease)   . Hyperlipidemia 06/11/2015  . Hypertension   . Hypothyroidism   . Kidney stone   . Type II diabetes mellitus ([place])    Past Surgical History:  Procedure Laterality Date  . CARDIAC CATHETERIZATION N/A 06/11/2015   Procedure: Left Heart Cath and Coronary Angiography;  Surgeon: Belva Crome, MD;  Location: [facility];  Service: Cardiovascular;  Laterality: N/A;  . COLONOSCOPY  06/21/2011   Procedure: COLONOSCOPY;  Surgeon: Rogene Houston, MD;  [facility];  Service: Endoscopy;  Laterality: N/A;  . COLONOSCOPY    . COLONOSCOPY N/A 09/20/2017   Procedure: COLONOSCOPY;  Surgeon: Rogene Houston, MD;  [facility];  Service: Endoscopy;  Laterality: N/A;  1200  . CORONARY ARTERY BYPASS GRAFT N/A 06/14/2015   Procedure: CORONARY ARTERY BYPASS  GRAFTING times four using Left Internal mammary artery and right leg Saphenous vein graft;  Surgeon: Ivin Poot, MD;  Location: [facility];  Service: Open Heart Surgery;  Laterality: N/A;  . 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 HAND SURGERY    . SKIN CANCER EXCISION Left    "forearm"  . TEE WITHOUT CARDIOVERSION N/A 06/14/2015   Procedure: TRANSESOPHAGEAL ECHOCARDIOGRAM (TEE);  Surgeon: Ivin Poot, MD;  Location: [facility];  Service: Open Heart Surgery;  Laterality: N/A;   Social History   Socioeconomic History  . Marital status: Married    Spouse name: Not on file  . Number of children: Not on file  . Years of education: Not on file  . Highest education level: Not on file  Occupational History  . Not on file  Social Needs  . Financial resource strain: Not on file  . Food insecurity    Worry: Not on file    Inability: Not on file  . Transportation needs    Medical: Not on file    Non-medical: Not on file  Tobacco Use  . Smoking status: Former Smoker    Packs/day: 3.00    Years: 47.00  Pack years: 141.00    Types: Cigarettes    Quit date: 11/20/1994    Years since quitting: 24.6  . Smokeless tobacco: Current User    Types: Chew  Substance and Sexual Activity  . Alcohol use: No    Alcohol/week: 0.0 standard drinks  . Drug use: No  . Sexual activity: Never    Partners: Female  Lifestyle  . Physical activity    Days per week: Not on file    Minutes per session: Not on file  . Stress: Not on file  Relationships  . Social Herbalist on phone: Not on file    Gets together: Not on file    Attends religious service: Not on file    Active member of club or organization: Not on file    Attends meetings of clubs or organizations: Not on file    Relationship status: Not on file  Other Topics Concern  . Not on file  Social History Narrative  . Not on file   Outpatient Encounter Medications as of 07/24/2019  Medication Sig  . aspirin EC 81 MG tablet  Take 1 tablet (81 mg total) daily by mouth.  . blood glucose meter kit and supplies KIT Dispense based on patient and insurance preference. Use up to four times daily as directed. (FOR ICD-10 E11.65)  . Blood Glucose Monitoring Suppl (ONETOUCH VERIO) w/Device KIT 1 each by Does not apply route 4 (four) times daily.  Marland Kitchen ezetimibe (ZETIA) 10 MG tablet Take 1 tablet by mouth once daily  . furosemide (LASIX) 20 MG tablet Take 1 tablet (20 mg total) by mouth daily as needed for edema.  . Insulin Glargine, 1 Unit Dial, 300 UNIT/ML SOPN Inject 50 Units into the skin at bedtime.  . Insulin Pen Needle (B-D ULTRAFINE III SHORT PEN) 31G X 8 MM MISC 1 each by Does not apply route as directed.  . Lancets MISC 1 each by Does not apply route 4 (four) times daily.  Marland Kitchen levothyroxine (SYNTHROID) 75 MCG tablet TAKE 1 TABLET BY MOUTH ONCE DAILY BEFORE BREAKFAST  . metoprolol tartrate (LOPRESSOR) 25 MG tablet Take 1 Tablet Two Times Daily  . ONETOUCH VERIO test strip USE 1 STRIP TO CHECK GLUCOSE 4 TIMES DAILY AS DIRECTED  . traMADol (ULTRAM) 50 MG tablet Take 1 tablet (50 mg total) by mouth every 6 (six) hours as needed. pain  . [DISCONTINUED] Insulin Glargine, 1 Unit Dial, 300 UNIT/ML SOPN Inject 48 Units into the skin at bedtime.   No facility-administered encounter medications on file as of 07/24/2019.    ALLERGIES: Allergies  Allergen Reactions  . Penicillins Rash    Has patient had a PCN reaction causing immediate rash, facial/tongue/throat swelling, SOB or lightheadedness with hypotension: Yes Has patient had a PCN reaction causing severe rash involving mucus membranes or skin necrosis: No Has patient had a PCN reaction that required hospitalization: No Has patient had a PCN reaction occurring within the last 10 years: No If all of the above answers are "NO", then may proceed with Cephalosporin use.    VACCINATION STATUS:  There is no immunization history on file for this patient.  Diabetes He presents  for his follow-up diabetic visit. He has type 2 diabetes mellitus. Onset time: He was diagnosed at approximate age of 71 years. His disease course has been stable. There are no hypoglycemic associated symptoms. Pertinent negatives for hypoglycemia include no confusion, headaches, pallor or seizures. Associated symptoms include fatigue. Pertinent negatives for  diabetes include no chest pain, no polydipsia, no polyphagia, no polyuria and no weakness. There are no hypoglycemic complications. Symptoms are stable. Diabetic complications include heart disease. Risk factors for coronary artery disease include dyslipidemia, diabetes mellitus, hypertension, male sex, sedentary lifestyle and tobacco exposure. Current diabetic treatment includes oral agent (monotherapy). He is compliant with treatment most of the time. His weight is fluctuating minimally. He is following a generally unhealthy diet. He has had a previous visit with a dietitian. He participates in exercise intermittently. His breakfast blood glucose range is generally 140-180 mg/dl. His bedtime blood glucose range is generally 180-200 mg/dl. His overall blood glucose range is 180-200 mg/dl. (    ) An ACE inhibitor/angiotensin II receptor blocker is not being taken.  Hypertension This is a chronic problem. The current episode started more than 1 year ago. The problem is uncontrolled. Pertinent negatives include no chest pain, headaches, neck pain, palpitations or shortness of breath. Risk factors for coronary artery disease include diabetes mellitus, dyslipidemia, sedentary lifestyle, smoking/tobacco exposure and male gender. Past treatments include beta blockers. Hypertensive end-organ damage includes kidney disease and CAD/MI.  Hyperlipidemia This is a chronic problem. The current episode started more than 1 year ago. The problem is uncontrolled. Exacerbating diseases include diabetes. Pertinent negatives include no chest pain, myalgias or shortness of  breath. Current antihyperlipidemic treatment includes ezetimibe (He does not tolerate statins, he could not afford the co-pay for Repatha.). The current treatment provides no improvement (His LDL remains significantly above target at 146 , he has history of significant statin intolerance.) of lipids. Risk factors for coronary artery disease include diabetes mellitus, dyslipidemia, a sedentary lifestyle and male sex.      Objective:    There were no vitals taken for this visit.  Wt Readings from Last 3 Encounters:  08/08/18 195 lb (88.5 kg)  04/25/18 198 lb 3.2 oz (89.9 kg)  04/05/18 195 lb 12.8 oz (88.8 kg)      Results for orders placed or performed in visit on 04/05/18  COMPLETE METABOLIC PANEL WITH GFR  Result Value Ref Range   Glucose, Bld 139 (H) 65 - 99 mg/dL   BUN 17 7 - 25 mg/dL   Creat 1.20 (H) 0.70 - 1.18 mg/dL   GFR, Est Non African American 59 (L) > OR = 60 mL/min/1.38m   GFR, Est African American 69 > OR = 60 mL/min/1.746m  BUN/Creatinine Ratio 14 6 - 22 (calc)   Sodium 141 135 - 146 mmol/L   Potassium 4.3 3.5 - 5.3 mmol/L   Chloride 105 98 - 110 mmol/L   CO2 30 20 - 32 mmol/L   Calcium 9.5 8.6 - 10.3 mg/dL   Total Protein 6.7 6.1 - 8.1 g/dL   Albumin 4.1 3.6 - 5.1 g/dL   Globulin 2.6 1.9 - 3.7 g/dL (calc)   AG Ratio 1.6 1.0 - 2.5 (calc)   Total Bilirubin 0.5 0.2 - 1.2 mg/dL   Alkaline phosphatase (APISO) 68 40 - 115 U/L   AST 17 10 - 35 U/L   ALT 14 9 - 46 U/L  Hemoglobin A1c  Result Value Ref Range   Hgb A1c MFr Bld 8.0 (H) <5.7 % of total Hgb   Mean Plasma Glucose 183 (calc)   eAG (mmol/L) 10.1 (calc)  Microalbumin / creatinine urine ratio  Result Value Ref Range   Creatinine, Urine 115 20 - 320 mg/dL   Microalb, Ur 1.8 mg/dL   Microalb Creat Ratio 16 <30 mcg/mg creat  TSH  Result Value Ref Range   TSH 2.24 0.40 - 4.50 mIU/L  T4, free  Result Value Ref Range   Free T4 1.0 0.8 - 1.8 ng/dL   Lipid Panel     Component Value Date/Time   CHOL 253  (H) 01/03/2018 0739   TRIG 207 (H) 01/03/2018 0739   HDL 39 (L) 01/03/2018 0739   CHOLHDL 6.5 (H) 01/03/2018 0739   VLDL 28 02/18/2016 0834   LDLCALC 177 (H) 01/03/2018 0739      Assessment & Plan:   1. Type 2 diabetes mellitus with vascular disease ([place]), CKD.  His diabetes is  complicated by recent coronary artery disease status post coronary artery bypass graft. - He did not go for his previsit labs, glycemic profile is controlled at fasting, slightly above target postprandial.    -A1c during his last visit was 8%, progressively improving.   -He has no documented or reported hypoglycemia.   - Recent labs reviewed, showing stage 2 renal failure. - Patient remains at a high risk for more acute and chronic complications of diabetes which include CAD, CVA, CKD, retinopathy, and neuropathy. These are all discussed in detail with the patient.  - I have re-counseled the patient on diet management and   by adopting a carbohydrate restricted / protein rich  Diet. - Patient is advised to stick to a routine mealtimes to eat 3 meals  a day and avoid unnecessary snacks ( to snack only to correct hypoglycemia).  - he  admits there is a room for improvement in his diet and drink choices. -  Suggestion is made for him to avoid simple carbohydrates  from his diet including Cakes, Sweet Desserts / Pastries, Ice Cream, Soda (diet and regular), Sweet Tea, Candies, Chips, Cookies, Sweet Pastries,  Store Bought Juices, Alcohol in Excess of  1-2 drinks a day, Artificial Sweeteners, Coffee Creamer, and "Sugar-free" Products. This will help patient to have stable blood glucose profile and potentially avoid unintended weight gain.  - I have approached patient with the following individualized plan to manage diabetes and patient agrees.  -Based on his presentation with controlled glycemic profile, he will not require prandial insulin for now.    -He is advised to increase Toujeo to 50 units nightly,    associated with strict monitoring of blood glucose 2 times a day-daily before breakfast and at bedtime.   - He is advised to call us if he registers blood glucose below 70 or above 200 x3.  -  He could not afford Januvia . - He does not tolerate metformin. -He will be considered for low-dose glipizide if he is next A1c is greater than 8%. - Patient specific target  for A1c; LDL, HDL, Triglycerides, and  Waist Circumference were discussed in detail.  2) BP/HTN: he is advised to home monitor blood pressure and report if > 140/90 on 2 separate readings.   He is advised to continue his metoprolol 25 mg p.o. twice daily.    3) Lipids/HPL: His repeat labs show LDL is still significantly above target at 146.  He does not tolerate statins.  Patient with established coronary artery disease status post CABG. he is tolerating Zetia.  He is advised to continue Zetia 10 mg by mouth daily at bedtime.  He is a good candidate for PCSK 9  inhibitor therapy, however he could not afford the $150 co-pay required.   4)  Weight/Diet: CDE consult in progress, exercise, and carbohydrates information provided.  5) Hypothyroidism:  -  His thyroid function tests are consistent with appropriate replacement. -  I will continue levothyroxine 75 g by mouth every morning.    - We discussed about the correct intake of his thyroid hormone, on empty stomach at fasting, with water, separated by at least 30 minutes from breakfast and other medications,  and separated by more than 4 hours from calcium, iron, multivitamins, acid reflux medications (PPIs). -Patient is made aware of the fact that thyroid hormone replacement is needed for life, dose to be adjusted by periodic monitoring of thyroid function tests.  6) vitamin D deficiency. - I advised him to continue his vitamin D 2 2000 units daily.   7) Chronic Care/Health Maintenance:  -Patient is encouraged to continue to follow up with Ophthalmology, Podiatrist at least yearly  or according to recommendations, and advised to  stay away from smoking. I have recommended yearly flu vaccine and pneumonia vaccination at least every 5 years; moderate intensity exercise for up to 150 minutes weekly; and  sleep for at least 7 hours a day.  I advised patient to maintain close follow up with his PCP for primary care needs.  - Patient Care Time Today:  25 min, of which >50% was spent in  counseling and the rest reviewing his  current and  previous labs/studies, previous treatments, his blood glucose readings, and medications' doses and developing a plan for long-term care based on the latest recommendations for standards of care.   Hunter Sparks participated in the discussions, expressed understanding, and voiced agreement with the above plans.  All questions were answered to his satisfaction. he is encouraged to contact clinic should he have any questions or concerns prior to his return visit.  Follow up plan: Return in about 3 months (around 10/23/2019) for Follow up with Pre-visit Labs, Next Visit A1c in Office.  Glade Lloyd, MD Phone: [phone]  Fax: [phone]  This note was partially dictated with voice recognition software. Similar sounding words can be transcribed inadequately or may not  be corrected upon review.  07/24/2019, 6:02 PM

## 2019-08-25 ENCOUNTER — Other Ambulatory Visit: Payer: Self-pay | Admitting: "Endocrinology

## 2019-09-10 ENCOUNTER — Other Ambulatory Visit: Payer: Self-pay | Admitting: "Endocrinology

## 2019-09-23 DIAGNOSIS — I10 Essential (primary) hypertension: Secondary | ICD-10-CM | POA: Diagnosis not present

## 2019-09-23 DIAGNOSIS — Z6826 Body mass index (BMI) 26.0-26.9, adult: Secondary | ICD-10-CM | POA: Diagnosis not present

## 2019-09-23 DIAGNOSIS — E7849 Other hyperlipidemia: Secondary | ICD-10-CM | POA: Diagnosis not present

## 2019-09-23 DIAGNOSIS — M545 Low back pain: Secondary | ICD-10-CM | POA: Diagnosis not present

## 2019-09-23 DIAGNOSIS — I700 Atherosclerosis of aorta: Secondary | ICD-10-CM | POA: Diagnosis not present

## 2019-09-23 DIAGNOSIS — M546 Pain in thoracic spine: Secondary | ICD-10-CM | POA: Diagnosis not present

## 2019-09-23 DIAGNOSIS — I2584 Coronary atherosclerosis due to calcified coronary lesion: Secondary | ICD-10-CM | POA: Diagnosis not present

## 2019-10-01 ENCOUNTER — Other Ambulatory Visit (HOSPITAL_COMMUNITY): Payer: Self-pay | Admitting: Internal Medicine

## 2019-10-01 ENCOUNTER — Other Ambulatory Visit: Payer: Self-pay | Admitting: Internal Medicine

## 2019-10-01 DIAGNOSIS — G8929 Other chronic pain: Secondary | ICD-10-CM

## 2019-10-08 ENCOUNTER — Ambulatory Visit (HOSPITAL_COMMUNITY)
Admission: RE | Admit: 2019-10-08 | Discharge: 2019-10-08 | Disposition: A | Discharge status: Home / Self Care | Payer: PPO | Source: Ambulatory Visit | Attending: Internal Medicine | Admitting: Internal Medicine

## 2019-10-08 ENCOUNTER — Other Ambulatory Visit: Payer: Self-pay

## 2019-10-08 DIAGNOSIS — M5442 Lumbago with sciatica, left side: Secondary | ICD-10-CM | POA: Insufficient documentation

## 2019-10-08 DIAGNOSIS — G8929 Other chronic pain: Secondary | ICD-10-CM | POA: Diagnosis not present

## 2019-10-08 DIAGNOSIS — M5441 Lumbago with sciatica, right side: Secondary | ICD-10-CM | POA: Insufficient documentation

## 2019-10-08 DIAGNOSIS — M545 Low back pain: Secondary | ICD-10-CM | POA: Diagnosis not present

## 2019-10-14 ENCOUNTER — Ambulatory Visit (INDEPENDENT_AMBULATORY_CARE_PROVIDER_SITE_OTHER): Payer: PPO | Admitting: Otolaryngology

## 2019-10-14 ENCOUNTER — Other Ambulatory Visit: Payer: Self-pay

## 2019-10-14 ENCOUNTER — Encounter (INDEPENDENT_AMBULATORY_CARE_PROVIDER_SITE_OTHER): Payer: Self-pay | Admitting: Otolaryngology

## 2019-10-14 VITALS — Temp 97.3°F

## 2019-10-14 DIAGNOSIS — H6123 Impacted cerumen, bilateral: Secondary | ICD-10-CM | POA: Diagnosis not present

## 2019-10-14 NOTE — Progress Notes (Signed)
HPI: Hunter Sparks is a 73 y.o. male who presents for evaluation of cerumen impaction referred by [facility].  He went to have a hearing test recently and they could not remove wax from the left ear canal using irrigation with complete obstruction of the left ear canal.  He is referred here.  He uses Q-tips in his ears because of itching in his ears..  Past Medical History:  Diagnosis Date  . Angina, class II ([place])   . Arthritis    "back, hips, legs" (06/10/2015)  . Chest pain   . Chronic lower back pain   . Depression   . DJD (degenerative joint disease)   . Hyperlipidemia 06/11/2015  . Hypertension   . Hypothyroidism   . Kidney stone   . Type II diabetes mellitus ([place])    Past Surgical History:  Procedure Laterality Date  . CARDIAC CATHETERIZATION N/A 06/11/2015   Procedure: Left Heart Cath and Coronary Angiography;  Surgeon: Belva Crome, MD;  Location: [facility];  Service: Cardiovascular;  Laterality: N/A;  . COLONOSCOPY  06/21/2011   Procedure: COLONOSCOPY;  Surgeon: Rogene Houston, MD;  [facility];  Service: Endoscopy;  Laterality: N/A;  . COLONOSCOPY    . COLONOSCOPY N/A 09/20/2017   Procedure: COLONOSCOPY;  Surgeon: Rogene Houston, MD;  [facility];  Service: Endoscopy;  Laterality: N/A;  1200  . CORONARY ARTERY BYPASS GRAFT N/A 06/14/2015   Procedure: CORONARY ARTERY BYPASS GRAFTING times four using Left Internal mammary artery and right leg Saphenous vein graft;  Surgeon: Ivin Poot, MD;  Location: [facility];  Service: Open Heart Surgery;  Laterality: N/A;  . 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 HAND SURGERY    . SKIN CANCER EXCISION Left    "forearm"  . TEE WITHOUT CARDIOVERSION N/A 06/14/2015   Procedure: TRANSESOPHAGEAL ECHOCARDIOGRAM (TEE);  Surgeon: Ivin Poot, MD;  Location: [facility];  Service: Open Heart Surgery;  Laterality: N/A;   Social History   Socioeconomic History  . Marital status: Married    Spouse  name: Not on file  . Number of children: Not on file  . Years of education: Not on file  . Highest education level: Not on file  Occupational History  . Not on file  Social Needs  . Financial resource strain: Not on file  . Food insecurity    Worry: Not on file    Inability: Not on file  . Transportation needs    Medical: Not on file    Non-medical: Not on file  Tobacco Use  . Smoking status: Former Smoker    Packs/day: 2.00    Years: 45.00    Pack years: 90.00    Types: Cigarettes    Start date: 16    Quit date: 2005    Years since quitting: 15.9  . Smokeless tobacco: Current User    Types: Chew  Substance and Sexual Activity  . Alcohol use: No    Alcohol/week: 0.0 standard drinks  . Drug use: No  . Sexual activity: Never    Partners: Female  Lifestyle  . Physical activity    Days per week: Not on file    Minutes per session: Not on file  . Stress: Not on file  Relationships  . Social Herbalist on phone: Not on file    Gets together: Not on file    Attends religious service: Not on file    Active  member of club or organization: Not on file    Attends meetings of clubs or organizations: Not on file    Relationship status: Not on file  Other Topics Concern  . Not on file  Social History Narrative  . Not on file   Family History  Problem Relation Age of Onset  . Stroke Mother   . Hypertension Mother   . Stomach cancer Father   . Liver cancer Father   . Cancer Father   . Rectal cancer Brother   . Cancer Brother   . Heart attack Neg Hx    Allergies  Allergen Reactions  . Penicillins Rash    Has patient had a PCN reaction causing immediate rash, facial/tongue/throat swelling, SOB or lightheadedness with hypotension: Yes Has patient had a PCN reaction causing severe rash involving mucus membranes or skin necrosis: No Has patient had a PCN reaction that required hospitalization: No Has patient had a PCN reaction occurring within the last 10  years: No If all of the above answers are "NO", then may proceed with Cephalosporin use.    Prior to Admission medications   Medication Sig Start Date End Date Taking? Authorizing Provider  aspirin EC 81 MG tablet Take 1 tablet (81 mg total) daily by mouth. 09/21/17  Yes Rehman, Mechele Dawley, MD  blood glucose meter kit and supplies KIT Dispense based on patient and insurance preference. Use up to four times daily as directed. (FOR ICD-10 E11.65) 12/07/17  Yes Nida, Marella Chimes, MD  Blood Glucose Monitoring Suppl (ONETOUCH VERIO) w/Device KIT 1 each by Does not apply route 4 (four) times daily. 04/10/18  Yes Cassandria Anger, MD  ezetimibe (ZETIA) 10 MG tablet Take 1 tablet by mouth once daily 05/14/19  Yes Branch, Alphonse Guild, MD  Insulin Glargine, 1 Unit Dial, 300 UNIT/ML SOPN Inject 50 Units into the skin at bedtime. 07/24/19  Yes Nida, Marella Chimes, MD  Insulin Pen Needle (B-D ULTRAFINE III SHORT PEN) 31G X 8 MM MISC 1 each by Does not apply route as directed. 01/04/17  Yes Cassandria Anger, MD  Lancets MISC 1 each by Does not apply route 4 (four) times daily. 04/10/18  Yes Cassandria Anger, MD  levothyroxine (SYNTHROID) 75 MCG tablet TAKE 1 TABLET BY MOUTH ONCE DAILY BEFORE BREAKFAST 09/10/19  Yes Nida, Marella Chimes, MD  metoprolol tartrate (LOPRESSOR) 25 MG tablet Take 1 Tablet Two Times Daily 04/25/18  Yes Branch, Alphonse Guild, MD  ONETOUCH VERIO test strip USE 1 STRIP TO CHECK GLUCOSE 4 TIMES DAILY AS DIRECTED 08/25/19  Yes Nida, Marella Chimes, MD  traMADol (ULTRAM) 50 MG tablet Take 1 tablet (50 mg total) by mouth every 6 (six) hours as needed. pain 06/19/15  Yes Barrett, Erin R, PA-C  furosemide (LASIX) 20 MG tablet Take 1 tablet (20 mg total) by mouth daily as needed for edema. 04/25/18 07/24/18  Arnoldo Lenis, MD     Positive ROS: Negative  All other systems have been reviewed and were otherwise negative with the exception of those mentioned in the HPI and as  above.  Physical Exam: Constitutional: Alert, well-appearing, no acute distress Ears: External ears without lesions or tenderness. Ear canals with minimal cerumen on the right side that was cleaned with a curette.  The left ear canal was completely occluded with a tip of a Q-tip that was removed with forceps.  Wax was removed with suction.  Patient had a area of granulation tissue inferiorly within the ear canal secondary  to foreign body.  TM was intact and clear.  Hearing screening after removal revealed symmetric hearing.. Nasal: External nose without lesions. Clear nasal passages Oral: Oropharynx clear. Neck: No palpable adenopathy or masses Respiratory: Breathing comfortably  Skin: No facial/neck lesions or rash noted.  Cerumen impaction removal  Date/Time: 10/14/2019 10:30 AM Performed by: Rozetta Nunnery, MD Authorized by: Rozetta Nunnery, MD   Consent:    Consent obtained:  Verbal   Consent given by:  Patient   Risks discussed:  Pain and bleeding Procedure details:    Location:  L ear and R ear   Procedure type: suction and forceps   Post-procedure details:    Inspection:  TM intact (Mild left external otitis.)   Hearing quality:  Improved   Patient tolerance of procedure:  Tolerated well, no immediate complications Comments:     Patient had a Q-tip stuck down in the left ear canal that was removed with forceps.  Patient has some granulation tissue in the left ear canal after removal of the Q-tip.  CSF powder was applied.    Assessment: Cerumen impaction with foreign body in left ear canal.  Plan: This was cleaned in the office. Cautioned him about use of Q-tips. Prescribed Cortisporin otic suspension drops to use if he develops any pain or itching in the left ear canal. He will follow-up as needed.  Radene Journey, MD

## 2019-10-16 ENCOUNTER — Other Ambulatory Visit: Payer: Self-pay | Admitting: Cardiology

## 2019-10-16 ENCOUNTER — Other Ambulatory Visit: Payer: Self-pay | Admitting: "Endocrinology

## 2019-10-20 DIAGNOSIS — E039 Hypothyroidism, unspecified: Secondary | ICD-10-CM | POA: Diagnosis not present

## 2019-10-20 DIAGNOSIS — E1159 Type 2 diabetes mellitus with other circulatory complications: Secondary | ICD-10-CM | POA: Diagnosis not present

## 2019-10-21 LAB — COMPLETE METABOLIC PANEL WITH GFR
AG Ratio: 1.5 (calc) (ref 1.0–2.5)
ALT: 12 U/L (ref 9–46)
AST: 15 U/L (ref 10–35)
Albumin: 4.1 g/dL (ref 3.6–5.1)
Alkaline phosphatase (APISO): 80 U/L (ref 35–144)
BUN/Creatinine Ratio: 11 (calc) (ref 6–22)
BUN: 13 mg/dL (ref 7–25)
CO2: 31 mmol/L (ref 20–32)
Calcium: 9.3 mg/dL (ref 8.6–10.3)
Chloride: 105 mmol/L (ref 98–110)
Creat: 1.21 mg/dL — ABNORMAL HIGH (ref 0.70–1.18)
GFR, Est African American: 67 mL/min/{1.73_m2} (ref >=60)
GFR, Est Non African American: 58 mL/min/{1.73_m2} — ABNORMAL LOW (ref >=60)
Globulin: 2.7 g/dL (calc) (ref 1.9–3.7)
Glucose, Bld: 114 mg/dL — ABNORMAL HIGH (ref 65–99)
Potassium: 4.1 mmol/L (ref 3.5–5.3)
Sodium: 144 mmol/L (ref 135–146)
Total Bilirubin: 0.7 mg/dL (ref 0.2–1.2)
Total Protein: 6.8 g/dL (ref 6.1–8.1)

## 2019-10-21 LAB — VITAMIN D 25 HYDROXY (VIT D DEFICIENCY, FRACTURES): Vit D, 25-Hydroxy: 26 ng/mL — ABNORMAL LOW (ref 30–100)

## 2019-10-21 LAB — TSH: TSH: 1.39 mIU/L (ref 0.40–4.50)

## 2019-10-21 LAB — T4, FREE: Free T4: 1.2 ng/dL (ref 0.8–1.8)

## 2019-10-27 ENCOUNTER — Encounter: Payer: Self-pay | Admitting: "Endocrinology

## 2019-10-27 ENCOUNTER — Other Ambulatory Visit: Payer: Self-pay

## 2019-10-27 ENCOUNTER — Ambulatory Visit (INDEPENDENT_AMBULATORY_CARE_PROVIDER_SITE_OTHER): Payer: PPO | Admitting: "Endocrinology

## 2019-10-27 VITALS — BP 133/74 | HR 68 | Ht 74.0 in | Wt 205.0 lb

## 2019-10-27 DIAGNOSIS — E559 Vitamin D deficiency, unspecified: Secondary | ICD-10-CM

## 2019-10-27 DIAGNOSIS — E039 Hypothyroidism, unspecified: Secondary | ICD-10-CM | POA: Diagnosis not present

## 2019-10-27 DIAGNOSIS — E782 Mixed hyperlipidemia: Secondary | ICD-10-CM

## 2019-10-27 DIAGNOSIS — E1159 Type 2 diabetes mellitus with other circulatory complications: Secondary | ICD-10-CM | POA: Diagnosis not present

## 2019-10-27 DIAGNOSIS — I10 Essential (primary) hypertension: Secondary | ICD-10-CM

## 2019-10-27 LAB — POCT GLYCOSYLATED HEMOGLOBIN (HGB A1C): Hemoglobin A1C: 9.2 % — AB (ref 4.0–5.6)

## 2019-10-27 MED ORDER — BD PEN NEEDLE SHORT U/F 31G X 8 MM MISC: 1.0000 | 3 refills | Status: AC

## 2019-10-27 MED ORDER — INSULIN GLARGINE (1 UNIT DIAL) 300 UNIT/ML ~~LOC~~ SOPN: 54.0000 [IU] | PEN_INJECTOR | Freq: Every day | SUBCUTANEOUS | 2 refills | Status: DC

## 2019-10-27 MED ORDER — ONETOUCH VERIO VI STRP: 1.0000 | ORAL_STRIP | Freq: Four times a day (QID) | 5 refills | Status: AC

## 2019-10-27 MED ORDER — GLIPIZIDE ER 5 MG PO TB24: 5.0000 mg | ORAL_TABLET | Freq: Every day | ORAL | 3 refills | Status: DC

## 2019-10-27 NOTE — Progress Notes (Signed)
10/27/2019                                                    Endocrinology Telehealth Visit Follow up Note -During COVID -19 Pandemic  This visit type was conducted due to national recommendations for restrictions regarding the COVID-19 Pandemic  in an effort to limit this patient's exposure and mitigate transmission of the corona virus.  Due to his co-morbid illnesses, Hunter Sparks is at  moderate to high risk for complications without adequate follow up.  This format is felt to be most appropriate for him at this time.  I connected with this patient on 10/27/2019   by telephone and verified that I am speaking with the correct person using two identifiers. Hunter Sparks, 12/10/1945. he has verbally consented to this visit. All issues noted in this document were discussed and addressed. The format was not optimal for physical exam.    Subjective:    Patient ID: Hunter Sparks, male    DOB: 02-Sep-1946,    Past Medical History:  Diagnosis Date  . Angina, class II ([place])   . Arthritis    "back, hips, legs" (06/10/2015)  . Chest pain   . Chronic lower back pain   . Depression   . DJD (degenerative joint disease)   . Hyperlipidemia 06/11/2015  . Hypertension   . Hypothyroidism   . Kidney stone   . Type II diabetes mellitus ([place])    Past Surgical History:  Procedure Laterality Date  . CARDIAC CATHETERIZATION N/A 06/11/2015   Procedure: Left Heart Cath and Coronary Angiography;  Surgeon: Belva Crome, MD;  Location: [facility];  Service: Cardiovascular;  Laterality: N/A;  . COLONOSCOPY  06/21/2011   Procedure: COLONOSCOPY;  Surgeon: Rogene Houston, MD;  [facility];  Service: Endoscopy;  Laterality: N/A;  . COLONOSCOPY    . COLONOSCOPY N/A 09/20/2017   Procedure: COLONOSCOPY;  Surgeon: Rogene Houston, MD;  [facility];  Service: Endoscopy;  Laterality: N/A;  1200  . CORONARY ARTERY BYPASS GRAFT N/A 06/14/2015   Procedure: CORONARY ARTERY BYPASS  GRAFTING times four using Left Internal mammary artery and right leg Saphenous vein graft;  Surgeon: Ivin Poot, MD;  Location: [facility];  Service: Open Heart Surgery;  Laterality: N/A;  . 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 HAND SURGERY    . SKIN CANCER EXCISION Left    "forearm"  . TEE WITHOUT CARDIOVERSION N/A 06/14/2015   Procedure: TRANSESOPHAGEAL ECHOCARDIOGRAM (TEE);  Surgeon: Ivin Poot, MD;  Location: [facility];  Service: Open Heart Surgery;  Laterality: N/A;   Social History   Socioeconomic History  . Marital status: Married    Spouse name: Not on file  . Number of children: Not on file  . Years of education: Not on file  . Highest education level: Not on file  Occupational History  . Not on file  Tobacco Use  . Smoking status: Former Smoker    Packs/day: 2.00    Years: 45.00    Pack years: 90.00    Types: Cigarettes    Start date: 53    Quit date: 2005    Years since quitting: 15.9  . Smokeless tobacco: Current User    Types: Chew  Substance and Sexual Activity  . Alcohol  use: No    Alcohol/week: 0.0 standard drinks  . Drug use: No  . Sexual activity: Never    Partners: Female  Other Topics Concern  . Not on file  Social History Narrative  . Not on file   Social Determinants of Health   Financial Resource Strain:   . Difficulty of Paying Living Expenses: Not on file  Food Insecurity:   . Worried About Charity fundraiser in the Last Year: Not on file  . Ran Out of Food in the Last Year: Not on file  Transportation Needs:   . Lack of Transportation (Medical): Not on file  . Lack of Transportation (Non-Medical): Not on file  Physical Activity:   . Days of Exercise per Week: Not on file  . Minutes of Exercise per Session: Not on file  Stress:   . Feeling of Stress : Not on file  Social Connections:   . Frequency of Communication with Friends and Family: Not on file  . Frequency of Social Gatherings with Friends and Family: Not on file   . Attends Religious Services: Not on file  . Active Member of Clubs or Organizations: Not on file  . Attends Archivist Meetings: Not on file  . Marital Status: Not on file   Outpatient Encounter Medications as of 10/27/2019  Medication Sig  . Cholecalciferol (VITAMIN D) 125 MCG (5000 UT) CAPS Take 5,000 Units by mouth daily.  Marland Kitchen glipiZIDE (GLUCOTROL XL) 5 MG 24 hr tablet Take 1 tablet (5 mg total) by mouth daily with breakfast.  . Insulin Glargine, 1 Unit Dial, 300 UNIT/ML SOPN Inject 54 Units into the skin at bedtime.  . Insulin Pen Needle (B-D ULTRAFINE III SHORT PEN) 31G X 8 MM MISC 1 each by Does not apply route as directed.  Marland Kitchen levothyroxine (SYNTHROID) 75 MCG tablet TAKE 1 TABLET BY MOUTH ONCE DAILY BEFORE BREAKFAST  . metoprolol tartrate (LOPRESSOR) 25 MG tablet Take 1 tablet by mouth twice daily  . ONETOUCH VERIO test strip USE 1 STRIP TO CHECK GLUCOSE 4 TIMES DAILY AS DIRECTED  . traMADol (ULTRAM) 50 MG tablet Take 1 tablet (50 mg total) by mouth every 6 (six) hours as needed. pain  . [DISCONTINUED] aspirin EC 81 MG tablet Take 1 tablet (81 mg total) daily by mouth.  . [DISCONTINUED] blood glucose meter kit and supplies KIT Dispense based on patient and insurance preference. Use up to four times daily as directed. (FOR ICD-10 E11.65)  . [DISCONTINUED] Blood Glucose Monitoring Suppl (ONETOUCH VERIO) w/Device KIT 1 each by Does not apply route 4 (four) times daily.  . [DISCONTINUED] ezetimibe (ZETIA) 10 MG tablet Take 1 tablet by mouth once daily  . [DISCONTINUED] furosemide (LASIX) 20 MG tablet Take 1 tablet (20 mg total) by mouth daily as needed for edema.  . [DISCONTINUED] Insulin Glargine, 1 Unit Dial, 300 UNIT/ML SOPN Inject 50 Units into the skin at bedtime.  . [DISCONTINUED] Lancets MISC 1 each by Does not apply route 4 (four) times daily.   No facility-administered encounter medications on file as of 10/27/2019.   ALLERGIES: Allergies  Allergen Reactions  .  Penicillins Rash    Has patient had a PCN reaction causing immediate rash, facial/tongue/throat swelling, SOB or lightheadedness with hypotension: Yes Has patient had a PCN reaction causing severe rash involving mucus membranes or skin necrosis: No Has patient had a PCN reaction that required hospitalization: No Has patient had a PCN reaction occurring within the last 10 years: No  If all of the above answers are "NO", then may proceed with Cephalosporin use.    VACCINATION STATUS:  There is no immunization history on file for this patient.  Diabetes He presents for his follow-up diabetic visit. He has type 2 diabetes mellitus. Onset time: He was diagnosed at approximate age of 22 years. His disease course has been worsening. There are no hypoglycemic associated symptoms. Pertinent negatives for hypoglycemia include no confusion, headaches, pallor or seizures. Associated symptoms include fatigue, polydipsia and polyuria. Pertinent negatives for diabetes include no chest pain, no polyphagia and no weakness. There are no hypoglycemic complications. Symptoms are worsening. Diabetic complications include heart disease. Risk factors for coronary artery disease include dyslipidemia, diabetes mellitus, hypertension, male sex, sedentary lifestyle and tobacco exposure. Current diabetic treatment includes oral agent (monotherapy). He is compliant with treatment most of the time. His weight is increasing steadily. He is following a generally unhealthy diet. He has had a previous visit with a dietitian. He participates in exercise intermittently. His home blood glucose trend is increasing steadily. His breakfast blood glucose range is generally 140-180 mg/dl. His bedtime blood glucose range is generally >200 mg/dl. His overall blood glucose range is >200 mg/dl. (-He returns with above target glycemic profile both fasting and postprandial.  His point-of-care A1c was 9.5% increasing from 8%.) An ACE  inhibitor/angiotensin II receptor blocker is not being taken.  Hypertension This is a chronic problem. The current episode started more than 1 year ago. The problem is uncontrolled. Pertinent negatives include no chest pain, headaches, neck pain, palpitations or shortness of breath. Risk factors for coronary artery disease include diabetes mellitus, dyslipidemia, sedentary lifestyle, smoking/tobacco exposure and male gender. Past treatments include beta blockers. Hypertensive end-organ damage includes kidney disease and CAD/MI.  Hyperlipidemia This is a chronic problem. The current episode started more than 1 year ago. The problem is uncontrolled. Exacerbating diseases include diabetes. Pertinent negatives include no chest pain, myalgias or shortness of breath. Current antihyperlipidemic treatment includes ezetimibe (He does not tolerate statins, he could not afford the co-pay for Repatha.). The current treatment provides no improvement (His LDL remains significantly above target at 146 , he has history of significant statin intolerance.) of lipids. Risk factors for coronary artery disease include diabetes mellitus, dyslipidemia, a sedentary lifestyle and male sex.      Objective:    BP 133/74   Pulse 68   Ht _0  (1.88 m)   Wt 205 lb (93 kg)   BMI 26.32 kg/m   Wt Readings from Last 3 Encounters:  10/27/19 205 lb (93 kg)  08/08/18 195 lb (88.5 kg)  04/25/18 198 lb 3.2 oz (89.9 kg)     Physical Exam- Limited  Constitutional:  Body mass index is 26.32 kg/m. , not in acute distress, normal state of mind Eyes:  EOMI, no exophthalmos Neck: Supple Respiratory: Adequate breathing efforts Musculoskeletal: no gross deformities, strength intact in all four extremities, no gross restriction of joint movements Skin:  no rashes, no hyperemia Neurological: no tremor with outstretched hands.   Results for orders placed or performed in visit on 10/27/19  HgB A1c  Result Value Ref Range    Hemoglobin A1C 9.2 (A) 4.0 - 5.6 %   HbA1c POC (<> result, manual entry)     HbA1c, POC (prediabetic range)     HbA1c, POC (controlled diabetic range)     Lipid Panel     Component Value Date/Time   CHOL 253 (H) 01/03/2018 0739   TRIG 207 (  H) 01/03/2018 0739   HDL 39 (L) 01/03/2018 0739   CHOLHDL 6.5 (H) 01/03/2018 0739   VLDL 28 02/18/2016 0834   LDLCALC 177 (H) 01/03/2018 0739      Assessment & Plan:   1. Type 2 diabetes mellitus with vascular disease ([place]), CKD.  His diabetes is  complicated by recent coronary artery disease status post coronary artery bypass graft. - He returns with above target glycemic profile and A1c of 9.5% increasing from 8%.   -He has no documented or reported hypoglycemia.   - Recent labs reviewed, showing stage 2 renal failure. - Patient remains at a high risk for more acute and chronic complications of diabetes which include CAD, CVA, CKD, retinopathy, and neuropathy. These are all discussed in detail with the patient.  - I have re-counseled the patient on diet management and   by adopting a carbohydrate restricted / protein rich  Diet. - Patient is advised to stick to a routine mealtimes to eat 3 meals  a day and avoid unnecessary snacks ( to snack only to correct hypoglycemia).  - he  admits there is a room for improvement in his diet and drink choices. -  Suggestion is made for him to avoid simple carbohydrates  from his diet including Cakes, Sweet Desserts / Pastries, Ice Cream, Soda (diet and regular), Sweet Tea, Candies, Chips, Cookies, Sweet Pastries,  Store Bought Juices, Alcohol in Excess of  1-2 drinks a day, Artificial Sweeteners, Coffee Creamer, and "Sugar-free" Products. This will help patient to have stable blood glucose profile and potentially avoid unintended weight gain.   - I have approached patient with the following individualized plan to manage diabetes and patient agrees.  -Based on his presentation with significant loss of  control of diabetes, he may need intensive treatment with basal/bolus insulin in order for him to achieve and maintain control of diabetes to target.    -Since he has sent home on his basal insulin, he is advised to increase Toujeo to 54 units nightly,  associated with strict monitoring of blood glucose 2 times a day-daily before breakfast and at bedtime.   - He is advised to call us if he registers blood glucose below 70 or above 200 x3.  -  He could not afford Januvia . - He does not tolerate metformin.  -He may benefit from low-dose glipizide.  I discussed and added glipizide 5 mg XL p.o. daily at breakfast.  - Patient specific target  for A1c; LDL, HDL, Triglycerides, and  Waist Circumference were discussed in detail.  2) BP/HTN: His blood pressure is controlled to target.   He is advised to continue his metoprolol 25 mg p.o. twice daily.    3) Lipids/HPL: His repeat labs show LDL is still significantly above target at 146.  He does not tolerate statins.  Patient with established coronary artery disease status post CABG. tolerating Zetia, he is advised to continue Zetia 10 mg p.o. daily at bedtime.   He is a good candidate for PCSK 9  inhibitor therapy, however he could not afford the $150 co-pay required.   4)  Weight/Diet: CDE consult in progress, exercise, and carbohydrates information provided.  5) Hypothyroidism:  -His thyroid function tests are consistent with appropriate replacement. -He is advised to continue levothyroxine 75 g by mouth every morning.    - We discussed about the correct intake of his thyroid hormone, on empty stomach at fasting, with water, separated by at least 30 minutes from breakfast and other  medications,  and separated by more than 4 hours from calcium, iron, multivitamins, acid reflux medications (PPIs). -Patient is made aware of the fact that thyroid hormone replacement is needed for life, dose to be adjusted by periodic monitoring of thyroid function  tests.   6) vitamin D deficiency. - I advised him to continue his vitamin D 2 2000 units daily.   7) Chronic Care/Health Maintenance:  -Patient is encouraged to continue to follow up with Ophthalmology, Podiatrist at least yearly or according to recommendations, and advised to  stay away from smoking. I have recommended yearly flu vaccine and pneumonia vaccination at least every 5 years; moderate intensity exercise for up to 150 minutes weekly; and  sleep for at least 7 hours a day.  I advised patient to maintain close follow up with his PCP for primary care needs.  - Patient Care Time Today:  25 min, of which >50% was spent in  counseling and the rest reviewing his  current and  previous labs/studies, previous treatments, his blood glucose readings, and medications' doses and developing a plan for long-term care based on the latest recommendations for standards of care.   Hunter Sparks participated in the discussions, expressed understanding, and voiced agreement with the above plans.  All questions were answered to his satisfaction. he is encouraged to contact clinic should he have any questions or concerns prior to his return visit.   Follow up plan: Return in about 6 months (around 04/26/2020) for Bring Meter and Logs- A1c in Office.  Glade Lloyd, MD Phone: [phone]  Fax: [phone]  This note was partially dictated with voice recognition software. Similar sounding words can be transcribed inadequately or may not  be corrected upon review.  10/27/2019, 2:47 PM

## 2019-11-12 ENCOUNTER — Telehealth: Payer: Self-pay | Admitting: "Endocrinology

## 2019-11-12 MED ORDER — INSULIN GLARGINE (1 UNIT DIAL) 300 UNIT/ML ~~LOC~~ SOPN: 54.0000 [IU] | PEN_INJECTOR | Freq: Every day | SUBCUTANEOUS | 2 refills | Status: DC

## 2019-11-12 NOTE — Telephone Encounter (Signed)
Pt's wife called and said that Dr Dorris Fetch increased his Toujeo to 54 units at his last visit. She said a new script needs to be called in to [facility]

## 2019-11-12 NOTE — Telephone Encounter (Signed)
Rx sent 

## 2019-11-19 DIAGNOSIS — E1165 Type 2 diabetes mellitus with hyperglycemia: Secondary | ICD-10-CM | POA: Diagnosis not present

## 2019-11-19 DIAGNOSIS — I10 Essential (primary) hypertension: Secondary | ICD-10-CM | POA: Diagnosis not present

## 2019-11-19 DIAGNOSIS — Z0189 Encounter for other specified special examinations: Secondary | ICD-10-CM | POA: Diagnosis not present

## 2019-11-19 DIAGNOSIS — G4700 Insomnia, unspecified: Secondary | ICD-10-CM | POA: Diagnosis not present

## 2019-11-19 DIAGNOSIS — M545 Low back pain: Secondary | ICD-10-CM | POA: Diagnosis not present

## 2019-11-19 DIAGNOSIS — E039 Hypothyroidism, unspecified: Secondary | ICD-10-CM | POA: Diagnosis not present

## 2019-12-03 DIAGNOSIS — E1165 Type 2 diabetes mellitus with hyperglycemia: Secondary | ICD-10-CM | POA: Diagnosis not present

## 2019-12-03 DIAGNOSIS — I10 Essential (primary) hypertension: Secondary | ICD-10-CM | POA: Diagnosis not present

## 2019-12-03 DIAGNOSIS — E039 Hypothyroidism, unspecified: Secondary | ICD-10-CM | POA: Diagnosis not present

## 2019-12-08 DIAGNOSIS — I10 Essential (primary) hypertension: Secondary | ICD-10-CM | POA: Diagnosis not present

## 2019-12-08 DIAGNOSIS — M545 Low back pain: Secondary | ICD-10-CM | POA: Diagnosis not present

## 2019-12-08 DIAGNOSIS — E039 Hypothyroidism, unspecified: Secondary | ICD-10-CM | POA: Diagnosis not present

## 2019-12-08 DIAGNOSIS — E1165 Type 2 diabetes mellitus with hyperglycemia: Secondary | ICD-10-CM | POA: Diagnosis not present

## 2019-12-08 DIAGNOSIS — E782 Mixed hyperlipidemia: Secondary | ICD-10-CM | POA: Diagnosis not present

## 2019-12-08 DIAGNOSIS — N1831 Chronic kidney disease, stage 3a: Secondary | ICD-10-CM | POA: Diagnosis not present

## 2019-12-08 DIAGNOSIS — G4700 Insomnia, unspecified: Secondary | ICD-10-CM | POA: Diagnosis not present

## 2019-12-08 DIAGNOSIS — Z0001 Encounter for general adult medical examination with abnormal findings: Secondary | ICD-10-CM | POA: Diagnosis not present

## 2020-01-16 ENCOUNTER — Telehealth: Payer: Self-pay | Admitting: "Endocrinology

## 2020-01-16 MED ORDER — BLOOD GLUCOSE METER KIT: 1.0000 | PACK | Freq: Four times a day (QID) | 0 refills | Status: AC

## 2020-01-16 NOTE — Telephone Encounter (Signed)
Patient said that the one touch meter he received from our office (2nd one) is not working right. It's the verio flex. Can you just call in a generic RX so they can see which insurance pays for. Test strips as well. [place]

## 2020-01-16 NOTE — Telephone Encounter (Signed)
Rx order for glucose meter kit and supplies sent to [facility].

## 2020-01-21 ENCOUNTER — Other Ambulatory Visit: Payer: Self-pay

## 2020-01-21 MED ORDER — GLIPIZIDE ER 5 MG PO TB24: 5.0000 mg | ORAL_TABLET | Freq: Every day | ORAL | 0 refills | Status: DC

## 2020-02-09 ENCOUNTER — Other Ambulatory Visit: Payer: Self-pay | Admitting: Cardiology

## 2020-03-10 DIAGNOSIS — I10 Essential (primary) hypertension: Secondary | ICD-10-CM | POA: Diagnosis not present

## 2020-03-10 DIAGNOSIS — E039 Hypothyroidism, unspecified: Secondary | ICD-10-CM | POA: Diagnosis not present

## 2020-03-10 DIAGNOSIS — E1165 Type 2 diabetes mellitus with hyperglycemia: Secondary | ICD-10-CM | POA: Diagnosis not present

## 2020-03-15 DIAGNOSIS — E1165 Type 2 diabetes mellitus with hyperglycemia: Secondary | ICD-10-CM | POA: Diagnosis not present

## 2020-03-15 DIAGNOSIS — M545 Low back pain: Secondary | ICD-10-CM | POA: Diagnosis not present

## 2020-03-15 DIAGNOSIS — N1831 Chronic kidney disease, stage 3a: Secondary | ICD-10-CM | POA: Diagnosis not present

## 2020-03-15 DIAGNOSIS — I10 Essential (primary) hypertension: Secondary | ICD-10-CM | POA: Diagnosis not present

## 2020-03-15 DIAGNOSIS — E039 Hypothyroidism, unspecified: Secondary | ICD-10-CM | POA: Diagnosis not present

## 2020-03-15 DIAGNOSIS — G4700 Insomnia, unspecified: Secondary | ICD-10-CM | POA: Diagnosis not present

## 2020-03-15 DIAGNOSIS — E782 Mixed hyperlipidemia: Secondary | ICD-10-CM | POA: Diagnosis not present

## 2020-03-17 DIAGNOSIS — M1612 Unilateral primary osteoarthritis, left hip: Secondary | ICD-10-CM | POA: Diagnosis not present

## 2020-03-17 DIAGNOSIS — M5416 Radiculopathy, lumbar region: Secondary | ICD-10-CM | POA: Diagnosis not present

## 2020-04-01 DIAGNOSIS — M1612 Unilateral primary osteoarthritis, left hip: Secondary | ICD-10-CM | POA: Diagnosis not present

## 2020-04-26 ENCOUNTER — Ambulatory Visit: Payer: PPO | Admitting: "Endocrinology

## 2020-06-17 DIAGNOSIS — E785 Hyperlipidemia, unspecified: Secondary | ICD-10-CM | POA: Diagnosis not present

## 2020-06-17 DIAGNOSIS — I10 Essential (primary) hypertension: Secondary | ICD-10-CM | POA: Diagnosis not present

## 2020-06-17 DIAGNOSIS — E039 Hypothyroidism, unspecified: Secondary | ICD-10-CM | POA: Diagnosis not present

## 2020-06-17 DIAGNOSIS — E1165 Type 2 diabetes mellitus with hyperglycemia: Secondary | ICD-10-CM | POA: Diagnosis not present

## 2020-06-21 DIAGNOSIS — N1831 Chronic kidney disease, stage 3a: Secondary | ICD-10-CM | POA: Diagnosis not present

## 2020-06-21 DIAGNOSIS — G4700 Insomnia, unspecified: Secondary | ICD-10-CM | POA: Diagnosis not present

## 2020-06-21 DIAGNOSIS — E039 Hypothyroidism, unspecified: Secondary | ICD-10-CM | POA: Diagnosis not present

## 2020-06-21 DIAGNOSIS — E782 Mixed hyperlipidemia: Secondary | ICD-10-CM | POA: Diagnosis not present

## 2020-06-21 DIAGNOSIS — M545 Low back pain: Secondary | ICD-10-CM | POA: Diagnosis not present

## 2020-06-21 DIAGNOSIS — E1165 Type 2 diabetes mellitus with hyperglycemia: Secondary | ICD-10-CM | POA: Diagnosis not present

## 2020-06-21 DIAGNOSIS — I10 Essential (primary) hypertension: Secondary | ICD-10-CM | POA: Diagnosis not present

## 2020-06-25 DIAGNOSIS — M791 Myalgia, unspecified site: Secondary | ICD-10-CM | POA: Diagnosis not present

## 2020-06-25 DIAGNOSIS — M47896 Other spondylosis, lumbar region: Secondary | ICD-10-CM | POA: Diagnosis not present

## 2020-06-25 DIAGNOSIS — I10 Essential (primary) hypertension: Secondary | ICD-10-CM | POA: Diagnosis not present

## 2020-06-25 DIAGNOSIS — M545 Low back pain: Secondary | ICD-10-CM | POA: Diagnosis not present

## 2020-07-08 DIAGNOSIS — Z6827 Body mass index (BMI) 27.0-27.9, adult: Secondary | ICD-10-CM | POA: Diagnosis not present

## 2020-07-08 DIAGNOSIS — M5416 Radiculopathy, lumbar region: Secondary | ICD-10-CM | POA: Diagnosis not present

## 2020-07-08 DIAGNOSIS — M545 Low back pain: Secondary | ICD-10-CM | POA: Diagnosis not present

## 2020-07-21 ENCOUNTER — Other Ambulatory Visit: Payer: Self-pay | Admitting: Unknown Physician Specialty

## 2020-07-21 ENCOUNTER — Other Ambulatory Visit (HOSPITAL_COMMUNITY): Payer: Self-pay

## 2020-07-21 ENCOUNTER — Telehealth: Payer: Self-pay | Admitting: Unknown Physician Specialty

## 2020-07-21 DIAGNOSIS — U071 COVID-19: Secondary | ICD-10-CM

## 2020-07-21 DIAGNOSIS — E1159 Type 2 diabetes mellitus with other circulatory complications: Secondary | ICD-10-CM

## 2020-07-21 DIAGNOSIS — Z951 Presence of aortocoronary bypass graft: Secondary | ICD-10-CM

## 2020-07-21 NOTE — Telephone Encounter (Signed)
I connected by phone with Hunter Sparks on 07/21/2020 at 6:16 PM to discuss the potential use of a new treatment for mild to moderate COVID-19 viral infection in non-hospitalized patients.  This patient is a 74 y.o. male that meets the FDA criteria for Emergency Use Authorization of COVID monoclonal antibody casirivimab/imdevimab.  Has a (+) direct SARS-CoV-2 viral test result  Has mild or moderate COVID-19   Is NOT hospitalized due to COVID-19  Is within 10 days of symptom onset  Has at least one of the high risk factor(s) for progression to severe COVID-19 and/or hospitalization as defined in EUA.  Specific high risk criteria : Older age (>/= 76 yo)   I have spoken and communicated the following to the patient or parent/caregiver regarding COVID monoclonal antibody treatment:  1. FDA has authorized the emergency use for the treatment of mild to moderate COVID-19 in adults and pediatric patients with positive results of direct SARS-CoV-2 viral testing who are 26 years of age and older weighing at least 40 kg, and who are at high risk for progressing to severe COVID-19 and/or hospitalization.  2. The significant known and potential risks and benefits of COVID monoclonal antibody, and the extent to which such potential risks and benefits are unknown.  3. Information on available alternative treatments and the risks and benefits of those alternatives, including clinical trials.  4. Patients treated with COVID monoclonal antibody should continue to self-isolate and use infection control measures (e.g., wear mask, isolate, social distance, avoid sharing personal items, clean and disinfect "high touch" surfaces, and frequent handwashing) according to CDC guidelines.   5. The patient or parent/caregiver has the option to accept or refuse COVID monoclonal antibody treatment.  After reviewing this information with the patient, The patient agreed to proceed with receiving casirivimab\imdevimab  infusion and will be provided a copy of the Fact sheet prior to receiving the infusion. Kathrine Haddock 07/21/2020 6:16 PM  Sx onset 9/7

## 2020-07-22 ENCOUNTER — Ambulatory Visit (HOSPITAL_COMMUNITY)
Admission: RE | Admit: 2020-07-22 | Discharge: 2020-07-22 | Disposition: A | Discharge status: Home / Self Care | Payer: Medicare Other | Source: Ambulatory Visit | Attending: Pulmonary Disease | Admitting: Pulmonary Disease

## 2020-07-22 DIAGNOSIS — Z23 Encounter for immunization: Secondary | ICD-10-CM | POA: Diagnosis not present

## 2020-07-22 DIAGNOSIS — Z951 Presence of aortocoronary bypass graft: Secondary | ICD-10-CM | POA: Diagnosis present

## 2020-07-22 DIAGNOSIS — U071 COVID-19: Secondary | ICD-10-CM | POA: Diagnosis present

## 2020-07-22 DIAGNOSIS — E1159 Type 2 diabetes mellitus with other circulatory complications: Secondary | ICD-10-CM | POA: Insufficient documentation

## 2020-07-22 MED ORDER — SODIUM CHLORIDE 0.9 % IV SOLN: INTRAVENOUS | Status: DC | PRN

## 2020-07-22 MED ORDER — ALBUTEROL SULFATE HFA 108 (90 BASE) MCG/ACT IN AERS: 2.0000 | INHALATION_SPRAY | Freq: Once | RESPIRATORY_TRACT | Status: DC | PRN

## 2020-07-22 MED ORDER — FAMOTIDINE IN NACL 20-0.9 MG/50ML-% IV SOLN: 20.0000 mg | Freq: Once | INTRAVENOUS | Status: DC | PRN

## 2020-07-22 MED ORDER — SODIUM CHLORIDE 0.9 % IV SOLN
1200.0000 mg | Freq: Once | INTRAVENOUS | Status: AC
  Administered 2020-07-22: 1200 mg via INTRAVENOUS
  Filled 2020-07-22: qty 10

## 2020-07-22 MED ORDER — EPINEPHRINE 0.3 MG/0.3ML IJ SOAJ: 0.3000 mg | Freq: Once | INTRAMUSCULAR | Status: DC | PRN

## 2020-07-22 MED ORDER — DIPHENHYDRAMINE HCL 50 MG/ML IJ SOLN: 50.0000 mg | Freq: Once | INTRAMUSCULAR | Status: DC | PRN

## 2020-07-22 MED ORDER — METHYLPREDNISOLONE SODIUM SUCC 125 MG IJ SOLR: 125.0000 mg | Freq: Once | INTRAMUSCULAR | Status: DC | PRN

## 2020-07-22 NOTE — Progress Notes (Signed)
  Diagnosis: COVID-19  Physician: Dr. Asencion Noble  Procedure: [facility] Med: casirivimab\imdevimab infusion - Provided patient with casirivimab\imdevimab fact sheet for patients, parents and caregivers prior to infusion.  Complications: No immediate complications noted.  Discharge: Discharged home   Hunter Sparks 07/22/2020

## 2020-07-22 NOTE — Discharge Instructions (Signed)

## 2020-08-08 ENCOUNTER — Other Ambulatory Visit: Payer: Self-pay | Admitting: Cardiology

## 2020-09-29 DIAGNOSIS — I10 Essential (primary) hypertension: Secondary | ICD-10-CM | POA: Diagnosis not present

## 2020-09-29 DIAGNOSIS — E039 Hypothyroidism, unspecified: Secondary | ICD-10-CM | POA: Diagnosis not present

## 2020-09-29 DIAGNOSIS — Z712 Person consulting for explanation of examination or test findings: Secondary | ICD-10-CM | POA: Diagnosis not present

## 2020-09-29 DIAGNOSIS — E1165 Type 2 diabetes mellitus with hyperglycemia: Secondary | ICD-10-CM | POA: Diagnosis not present

## 2020-09-29 DIAGNOSIS — E782 Mixed hyperlipidemia: Secondary | ICD-10-CM | POA: Diagnosis not present

## 2020-10-04 DIAGNOSIS — E782 Mixed hyperlipidemia: Secondary | ICD-10-CM | POA: Diagnosis not present

## 2020-10-04 DIAGNOSIS — I129 Hypertensive chronic kidney disease with stage 1 through stage 4 chronic kidney disease, or unspecified chronic kidney disease: Secondary | ICD-10-CM | POA: Diagnosis not present

## 2020-10-04 DIAGNOSIS — E1165 Type 2 diabetes mellitus with hyperglycemia: Secondary | ICD-10-CM | POA: Diagnosis not present

## 2020-10-04 DIAGNOSIS — G894 Chronic pain syndrome: Secondary | ICD-10-CM | POA: Diagnosis not present

## 2020-10-04 DIAGNOSIS — Z8616 Personal history of COVID-19: Secondary | ICD-10-CM | POA: Diagnosis not present

## 2020-10-04 DIAGNOSIS — N1831 Chronic kidney disease, stage 3a: Secondary | ICD-10-CM | POA: Diagnosis not present

## 2020-10-04 DIAGNOSIS — M545 Low back pain, unspecified: Secondary | ICD-10-CM | POA: Diagnosis not present

## 2020-10-04 DIAGNOSIS — G3184 Mild cognitive impairment, so stated: Secondary | ICD-10-CM | POA: Diagnosis not present

## 2020-10-04 DIAGNOSIS — G4700 Insomnia, unspecified: Secondary | ICD-10-CM | POA: Diagnosis not present

## 2020-10-04 DIAGNOSIS — E039 Hypothyroidism, unspecified: Secondary | ICD-10-CM | POA: Diagnosis not present

## 2020-10-29 ENCOUNTER — Other Ambulatory Visit: Payer: Self-pay

## 2020-10-29 ENCOUNTER — Emergency Department (HOSPITAL_COMMUNITY): Payer: PPO

## 2020-10-29 ENCOUNTER — Encounter (HOSPITAL_COMMUNITY): Payer: Self-pay

## 2020-10-29 ENCOUNTER — Emergency Department (HOSPITAL_COMMUNITY)
Admission: EM | Admit: 2020-10-29 | Discharge: 2020-10-29 | Disposition: A | Discharge status: Home / Self Care | Payer: PPO | Attending: Emergency Medicine | Admitting: Emergency Medicine

## 2020-10-29 DIAGNOSIS — Z794 Long term (current) use of insulin: Secondary | ICD-10-CM | POA: Diagnosis not present

## 2020-10-29 DIAGNOSIS — Z7984 Long term (current) use of oral hypoglycemic drugs: Secondary | ICD-10-CM | POA: Diagnosis not present

## 2020-10-29 DIAGNOSIS — R109 Unspecified abdominal pain: Secondary | ICD-10-CM

## 2020-10-29 DIAGNOSIS — Z79899 Other long term (current) drug therapy: Secondary | ICD-10-CM | POA: Insufficient documentation

## 2020-10-29 DIAGNOSIS — I25810 Atherosclerosis of coronary artery bypass graft(s) without angina pectoris: Secondary | ICD-10-CM | POA: Insufficient documentation

## 2020-10-29 DIAGNOSIS — K838 Other specified diseases of biliary tract: Secondary | ICD-10-CM | POA: Insufficient documentation

## 2020-10-29 DIAGNOSIS — Z87891 Personal history of nicotine dependence: Secondary | ICD-10-CM | POA: Diagnosis not present

## 2020-10-29 DIAGNOSIS — M545 Low back pain, unspecified: Secondary | ICD-10-CM | POA: Diagnosis not present

## 2020-10-29 DIAGNOSIS — K8020 Calculus of gallbladder without cholecystitis without obstruction: Secondary | ICD-10-CM | POA: Insufficient documentation

## 2020-10-29 DIAGNOSIS — I2510 Atherosclerotic heart disease of native coronary artery without angina pectoris: Secondary | ICD-10-CM | POA: Diagnosis not present

## 2020-10-29 DIAGNOSIS — I119 Hypertensive heart disease without heart failure: Secondary | ICD-10-CM | POA: Insufficient documentation

## 2020-10-29 DIAGNOSIS — N200 Calculus of kidney: Secondary | ICD-10-CM | POA: Diagnosis not present

## 2020-10-29 DIAGNOSIS — E278 Other specified disorders of adrenal gland: Secondary | ICD-10-CM | POA: Diagnosis not present

## 2020-10-29 DIAGNOSIS — K839 Disease of biliary tract, unspecified: Secondary | ICD-10-CM | POA: Diagnosis not present

## 2020-10-29 DIAGNOSIS — R319 Hematuria, unspecified: Secondary | ICD-10-CM | POA: Diagnosis not present

## 2020-10-29 DIAGNOSIS — E1169 Type 2 diabetes mellitus with other specified complication: Secondary | ICD-10-CM | POA: Insufficient documentation

## 2020-10-29 LAB — COMPREHENSIVE METABOLIC PANEL
ALT: 15 U/L (ref 0–44)
AST: 14 U/L — ABNORMAL LOW (ref 15–41)
Albumin: 4 g/dL (ref 3.5–5.0)
Alkaline Phosphatase: 73 U/L (ref 38–126)
Anion gap: 10 (ref 5–15)
BUN: 17 mg/dL (ref 8–23)
CO2: 25 mmol/L (ref 22–32)
Calcium: 9 mg/dL (ref 8.9–10.3)
Chloride: 101 mmol/L (ref 98–111)
Creatinine, Ser: 1.14 mg/dL (ref 0.61–1.24)
GFR, Estimated: >60 mL/min (ref >60)
Glucose, Bld: 240 mg/dL — ABNORMAL HIGH (ref 70–99)
Potassium: 4 mmol/L (ref 3.5–5.1)
Sodium: 136 mmol/L (ref 135–145)
Total Bilirubin: 0.8 mg/dL (ref 0.3–1.2)
Total Protein: 7.5 g/dL (ref 6.5–8.1)

## 2020-10-29 LAB — CBC WITH DIFFERENTIAL/PLATELET
Abs Immature Granulocytes: 0.03 10*3/uL (ref 0.00–0.07)
Basophils Absolute: 0 10*3/uL (ref 0.0–0.1)
Basophils Relative: 0 %
Eosinophils Absolute: 0.3 10*3/uL (ref 0.0–0.5)
Eosinophils Relative: 3 %
HCT: 43 % (ref 39.0–52.0)
Hemoglobin: 14.3 g/dL (ref 13.0–17.0)
Immature Granulocytes: 0 %
Lymphocytes Relative: 26 %
Lymphs Abs: 2.4 10*3/uL (ref 0.7–4.0)
MCH: 30.4 pg (ref 26.0–34.0)
MCHC: 33.3 g/dL (ref 30.0–36.0)
MCV: 91.5 fL (ref 80.0–100.0)
Monocytes Absolute: 0.7 10*3/uL (ref 0.1–1.0)
Monocytes Relative: 8 %
Neutro Abs: 5.8 10*3/uL (ref 1.7–7.7)
Neutrophils Relative %: 63 %
Platelets: 191 10*3/uL (ref 150–400)
RBC: 4.7 MIL/uL (ref 4.22–5.81)
RDW: 12.7 % (ref 11.5–15.5)
WBC: 9.3 10*3/uL (ref 4.0–10.5)
nRBC: 0 % (ref 0.0–0.2)

## 2020-10-29 LAB — URINALYSIS, ROUTINE W REFLEX MICROSCOPIC
Bilirubin Urine: NEGATIVE
Glucose, UA: >=500 mg/dL — AB
Ketones, ur: NEGATIVE mg/dL
Leukocytes,Ua: NEGATIVE
Nitrite: NEGATIVE
Protein, ur: 100 mg/dL — AB
RBC / HPF: >50 RBC/hpf — ABNORMAL HIGH (ref 0–5)
Specific Gravity, Urine: 1.017 (ref 1.005–1.030)
pH: 5 (ref 5.0–8.0)

## 2020-10-29 LAB — LIPASE, BLOOD: Lipase: 31 U/L (ref 11–51)

## 2020-10-29 MED ORDER — MORPHINE SULFATE (PF) 4 MG/ML IV SOLN
4.0000 mg | Freq: Once | INTRAVENOUS | Status: AC
  Administered 2020-10-29: 16:00:00 4 mg via INTRAVENOUS
  Filled 2020-10-29: qty 1

## 2020-10-29 MED ORDER — SULFAMETHOXAZOLE-TRIMETHOPRIM 800-160 MG PO TABS: 1.0000 | ORAL_TABLET | Freq: Two times a day (BID) | ORAL | 0 refills | Status: AC

## 2020-10-29 MED ORDER — ONDANSETRON HCL 4 MG/2ML IJ SOLN
4.0000 mg | Freq: Once | INTRAMUSCULAR | Status: AC
  Administered 2020-10-29: 15:00:00 4 mg via INTRAVENOUS
  Filled 2020-10-29: qty 2

## 2020-10-29 NOTE — ED Notes (Signed)
Upon entering room to perform COVID test pt refused the test stating that he wants to go home. PA J. Idol notified. Pts son is at bedside.

## 2020-10-29 NOTE — ED Provider Notes (Addendum)
[facility] EMERGENCY DEPARTMENT Provider Note   CSN: [id number] Arrival date & time: 10/29/20  1329     History Chief Complaint  Patient presents with  . Flank Pain    Hunter Sparks is a 74 y.o. male with a history as outlined below, most significant for type 2 diabetes, history of CAD with surgical history of CABG x4, also with hypertension, history of kidney stones presenting today with right flank pain which started gradually this morning.  He was sitting when he developed right-sided pain which he described as intermittently sharp and stabbing but is currently improved.  His symptoms are not associated with movement or positional.  He denies radiation of pain into his extremities.  He denies hematuria but does endorse urinating small amounts of urine but not more frequently than normal.  He has had no fevers or chills, but endorses one episode of emesis just prior to arrival.   He has had no medications prior to arrival and states that his pain is currently better.  He is unsure if his current symptoms remind him of prior kidney stone episodes since it has been several years since his last kidney stone.  HPI     Past Medical History:  Diagnosis Date  . Angina, class II ([place])   . Arthritis    "back, hips, legs" (06/10/2015)  . Chest pain   . Chronic lower back pain   . Depression   . DJD (degenerative joint disease)   . Hyperlipidemia 06/11/2015  . Hypertension   . Hypothyroidism   . Kidney stone   . Kidney stone   . Type II diabetes mellitus [facility])     Patient Active Problem List   Diagnosis Date Noted  . Hx of colonic polyps 06/13/2017  . Family hx of colon cancer 06/13/2017  . Vitamin D insufficiency 01/04/2017  . Uncontrolled type 2 diabetes mellitus with complication, without long-term current use of insulin ([place]) 09/01/2015  . 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  . Mixed hyperlipidemia 06/11/2015  . Essential hypertension   . Coronary artery disease involving native  coronary artery of native heart with unstable angina pectoris ([place])   . Angina, class II ([place]) 06/10/2015  . DM type 2 causing vascular disease ([place]) 06/10/2015  . Hypothyroidism 06/10/2015  . History of tobacco use 06/10/2015  . Chronic low back pain 06/10/2015    Past Surgical History:  Procedure Laterality Date  . CARDIAC CATHETERIZATION N/A 06/11/2015   Procedure: Left Heart Cath and Coronary Angiography;  Surgeon: Belva Crome, MD;  Location: [place] CV LAB;  Service: Cardiovascular;  Laterality: N/A;  . COLONOSCOPY  06/21/2011   Procedure: COLONOSCOPY;  Surgeon: Rogene [place], MD;  [facility];  Service: Endoscopy;  Laterality: N/A;  . COLONOSCOPY    . COLONOSCOPY N/A 09/20/2017   Procedure: COLONOSCOPY;  Surgeon: Rogene [place], MD;  [facility];  Service: Endoscopy;  Laterality: N/A;  1200  . CORONARY ARTERY BYPASS GRAFT N/A 06/14/2015   Procedure: CORONARY ARTERY BYPASS GRAFTING times four using Left Internal mammary artery and right leg Saphenous vein graft;  Surgeon: Ivin Poot, MD;  Location: [place];  Service: Open Heart Surgery;  Laterality: N/A;  . 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 HAND SURGERY    . SKIN CANCER EXCISION Left    "forearm"  . TEE WITHOUT CARDIOVERSION N/A 06/14/2015   Procedure: TRANSESOPHAGEAL ECHOCARDIOGRAM (TEE);  Surgeon: Ivin Poot, MD;  Location: [place];  Service: Open Heart Surgery;  Laterality: N/A;       Family History  Problem Relation Age of Onset  . Stroke Mother   . Hypertension Mother   . Stomach cancer Father   . Liver cancer Father   . Cancer Father   . Rectal cancer Brother   . Cancer Brother   . Heart attack Neg Hx     Social History   Tobacco Use  . Smoking status: Former Smoker    Packs/day: 2.00    Years: 45.00    Pack years: 90.00    Types: Cigarettes    Start date: 38    Quit date: 2005    Years since quitting: 16.9  . Smokeless tobacco: Current User    Types:  Chew  Vaping Use  . Vaping Use: Never used  Substance Use Topics  . Alcohol use: No    Alcohol/week: 0.0 standard drinks  . Drug use: No    Home Medications Prior to Admission medications   Medication Sig Start Date End Date Taking? Authorizing Provider  blood glucose meter kit and supplies 1 each by Other route 4 (four) times daily. Dispense based on patient and insurance preference. Use up to four times daily as directed. (FOR ICD-10 E10.9, E11.9). 01/16/20   Cassandria Anger, MD  Cholecalciferol (VITAMIN D) 125 MCG (5000 UT) CAPS Take 5,000 Units by mouth daily.    [provider]  glipiZIDE (GLUCOTROL XL) 5 MG 24 hr tablet Take 1 tablet (5 mg total) by mouth daily with breakfast. 01/21/20   Nida, Marella Chimes, MD  glucose blood (ONETOUCH VERIO) test strip 1 each by Other route 4 (four) times daily. Use as instructed 10/27/19   Cassandria Anger, MD  Insulin Glargine, 1 Unit Dial, 300 UNIT/ML SOPN Inject 54 Units into the skin at bedtime. 11/12/19   Cassandria Anger, MD  Insulin Pen Needle (B-D ULTRAFINE III SHORT PEN) 31G X 8 MM MISC 1 each by Does not apply route as directed. 10/27/19   Cassandria Anger, MD  levothyroxine (SYNTHROID) 75 MCG tablet TAKE 1 TABLET BY MOUTH ONCE DAILY BEFORE BREAKFAST 10/16/19   Cassandria Anger, MD  metoprolol tartrate (LOPRESSOR) 25 MG tablet Take 1 tablet by mouth twice daily 08/09/20   Arnoldo Lenis, MD  traMADol (ULTRAM) 50 MG tablet Take 1 tablet (50 mg total) by mouth every 6 (six) hours as needed. pain 06/19/15   Barrett, Erin R, PA-C    Allergies    Penicillins  Review of Systems   Review of Systems  Constitutional: Negative for chills and fever.  HENT: Negative for congestion and sore throat.   Eyes: Negative.   Respiratory: Negative for chest tightness and shortness of breath.   Cardiovascular: Negative for chest pain.  Gastrointestinal: Negative for abdominal distention, abdominal pain, diarrhea, nausea  and vomiting.  Genitourinary: Positive for flank pain and frequency. Negative for dysuria, hematuria and urgency.  Musculoskeletal: Negative for arthralgias, joint swelling and neck pain.  Skin: Negative.  Negative for rash and wound.  Neurological: Negative for dizziness, weakness, light-headedness, numbness and headaches.  Psychiatric/Behavioral: Negative.   All other systems reviewed and are negative.   Physical Exam Updated Vital Signs BP (!) 150/89   Pulse 63   Temp 98.1 F (36.7 C) (Oral)   Resp 20   Ht 6' (1.829 m)   Wt 90.7 kg   SpO2 96%   BMI 27.12 kg/m   Physical Exam Vitals and nursing note  reviewed.  Constitutional:      Appearance: He is well-developed and well-nourished.  HENT:     Head: Normocephalic and atraumatic.  Eyes:     Conjunctiva/sclera: Conjunctivae normal.  Cardiovascular:     Rate and Rhythm: Normal rate and regular rhythm.     Pulses: Intact distal pulses.     Heart sounds: Normal heart sounds.  Pulmonary:     Effort: Pulmonary effort is normal.     Breath sounds: Normal breath sounds. No wheezing.  Abdominal:     General: Abdomen is protuberant. Bowel sounds are normal.     Palpations: Abdomen is soft.     Tenderness: There is no abdominal tenderness.     Comments: No guarding.  Abd soft.  Tender right upper lateral and flank.   Musculoskeletal:        General: Normal range of motion.     Cervical back: Normal range of motion.  Skin:    General: Skin is warm and dry.  Neurological:     Mental Status: He is alert.  Psychiatric:        Mood and Affect: Mood and affect normal.     ED Results / Procedures / Treatments   Labs (all labs ordered are listed, but only abnormal results are displayed) Labs Reviewed  URINALYSIS, ROUTINE W REFLEX MICROSCOPIC - Abnormal; Notable for the following components:      Result Value   APPearance HAZY (*)    Glucose, UA >=500 (*)    Hgb urine dipstick LARGE (*)    Protein, ur 100 (*)    RBC / HPF  >50 (*)    Bacteria, UA RARE (*)    All other components within normal limits  COMPREHENSIVE METABOLIC PANEL - Abnormal; Notable for the following components:   Glucose, Bld 240 (*)    AST 14 (*)    All other components within normal limits  URINE CULTURE  RESP PANEL BY RT-PCR (FLU A&B, COVID) ARPGX2  CBC WITH DIFFERENTIAL/PLATELET  LIPASE, BLOOD    EKG None  Radiology CT Renal Stone Study  Result Date: 10/29/2020 CLINICAL DATA:  Flank pain, kidney stone suspected Patient reports low back pain onset in our prior to arrival. History of kidney stone. EXAM: CT ABDOMEN AND PELVIS WITHOUT CONTRAST TECHNIQUE: Multidetector CT imaging of the abdomen and pelvis was performed following the standard protocol without IV contrast. COMPARISON:  Abdominopelvic CT 12/12/2012 FINDINGS: Lower chest: Coronary artery calcifications. Aortic atherosclerosis. 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 There are adjacent surgical clips. Chronic appearing coarsening in the lung bases likely smoking related. Hepatobiliary: No evidence of focal hepatic lesion on noncontrast exam. There are calcified gallstones in the gallbladder. Pneumobilia with air in the gallbladder and central biliary tree. No air in the gallbladder wall. There is no pericholecystic inflammation or fat stranding. There is no common bile duct dilatation. No visualized choledocholithiasis. The pylorus abuts the gallbladder without clear fat plane, but no adjacent inflammatory change. Pancreas: Mild fatty atrophy.  No ductal dilatation or inflammation. Spleen: Normal in size without focal abnormality. Adrenals/Urinary Tract: Slight thickening of the left adrenal gland, stable. Normal right adrenal gland. No hydronephrosis. Punctate nonobstructing stone in the right upper calyx. 6 mm nonobstructing stone in the lower left kidney. Bilateral symmetric perinephric edema in renal lobulation. Tiny low-density lesion in the upper left  kidney, too small to accurately characterize. Decompressed ureters without ureteral stone. Urinary bladder is partially distended, no bladder stone. Stomach/Bowel: Decompressed stomach. No  small bowel obstruction or inflammation. There is fecalization of small bowel contents. Moderate colonic stool burden. No colonic wall thickening or inflammation. Colonic diverticulosis without diverticulitis. Normal appendix. Vascular/Lymphatic: Moderate aortic and branch atherosclerosis. No enlarged lymph nodes. Reproductive: Prostate is unremarkable. Other: No ascites. No pneumoperitoneum. Postsurgical change in the left upper quadrant with surgical clips in the subchondral region anteriorly. Musculoskeletal: Chronic anterior wedging of T12. There are no acute or suspicious osseous abnormalities. IMPRESSION: 1. Bilateral nonobstructing renal calculi. No hydronephrosis or obstructive uropathy. 2. Cholelithiasis. Pneumobilia with air in the gallbladder and central biliary tree. Recommend correlation for procedural history, this may be related to prior ERCP or sphincterotomy. In the absence of prior intervention, emphysematous cholecystitis or biliary enteric fistula is considered, and surgical consultation is recommended. Interestingly, there is no pericholecystic fat stranding or inflammation. No biliary dilatation. 3. Colonic diverticulosis without diverticulitis. 4. Fecalization of small bowel contents with moderate colonic stool burden, can be seen with slow transit/constipation. Aortic Atherosclerosis (ICD10-I70.0). These results were called by telephone at the time of interpretation on 10/29/2020 at 4:39 pm to provider Arrionna Serena , who verbally acknowledged these results. Electronically Signed   By: Keith Rake M.D.   On: 10/29/2020 16:39    Procedures Procedures (including critical care time)  Medications Ordered in ED Medications  morphine 4 MG/ML injection 4 mg (4 mg Intravenous Given 10/29/20 1530)   ondansetron (ZOFRAN) injection 4 mg (4 mg Intravenous Given 10/29/20 1529)    ED Course  I have reviewed the triage vital signs and the nursing notes.  Pertinent labs & imaging results that were available during my care of the patient were reviewed by me and considered in my medical decision making (see chart for details).    MDM Rules/Calculators/A&P                          Pt with right upper abd/flank pain with pneumobilia on Ct imaging. Pt denies any procedures/instrumentation/ERCP.  CBC, LFT's and lipase normal range,  Hematuria, rare urine, urine culture ordered.  Discussed findings with Dr. Lysle Pearl of general surgery - he will follow patient, requesting medical admission for repeat labs, serial exams.  He is unsure at this time if CT findings represents acute gallbladder complications vs urinary infection given perinephric edema.  He plans to see pt in am.      Pt discussed with Dr. Nehemiah Settle who accepts pt for admission.  Covid screening pending.  6:45 PM Pt now refusing admission.  Discussed with patient the potential complications if he has an early gallbladder infection - also discussed strict return precautions including pain, fever, return of vomiting.  Advised he can return at any time for re-evaluation.  He is competent to make this decision - son at bedside involved in conversation.  He will be given abx for his hematuria, possible urinary infection.  Final Clinical Impression(s) / ED Diagnoses Final diagnoses:  Pneumobilia  Calculus of gallbladder without cholecystitis without obstruction  Hematuria, unspecified type  Flank pain    Rx / DC Orders ED Discharge Orders    None       Landis Martins 10/29/20 1836    Evalee Jefferson, PA-C 10/29/20 2016    Fredia Sorrow, MD 11/20/20 [phone]

## 2020-10-29 NOTE — Discharge Instructions (Addendum)
As discussed, you are leaving against our advice, however please return at any time for reevaluation if your symptoms persist or worsen.  Specifically if you develop fever, worsening pain, especially in your right upper abdomen, return of your nausea or vomiting return here for further evaluation.  You are being prescribed an antibiotic to treat a possible urinary infection given the large amount of blood in your urine.

## 2020-10-29 NOTE — ED Triage Notes (Signed)
Pt reports lower back pain that began approx an hour ago

## 2020-11-02 DIAGNOSIS — G3184 Mild cognitive impairment, so stated: Secondary | ICD-10-CM | POA: Diagnosis not present

## 2020-11-02 DIAGNOSIS — N390 Urinary tract infection, site not specified: Secondary | ICD-10-CM | POA: Diagnosis not present

## 2020-11-02 DIAGNOSIS — K8010 Calculus of gallbladder with chronic cholecystitis without obstruction: Secondary | ICD-10-CM | POA: Diagnosis not present

## 2020-11-02 DIAGNOSIS — E782 Mixed hyperlipidemia: Secondary | ICD-10-CM | POA: Diagnosis not present

## 2020-11-02 DIAGNOSIS — E1165 Type 2 diabetes mellitus with hyperglycemia: Secondary | ICD-10-CM | POA: Diagnosis not present

## 2020-11-02 DIAGNOSIS — G720 Drug-induced myopathy: Secondary | ICD-10-CM | POA: Diagnosis not present

## 2020-11-02 LAB — URINE CULTURE: Culture: 20000 — AB

## 2020-11-03 ENCOUNTER — Telehealth: Payer: Self-pay

## 2020-11-03 NOTE — Telephone Encounter (Signed)
No treatment for UC ED 10/29/20 per Delia Heady PA

## 2020-12-06 DIAGNOSIS — K8020 Calculus of gallbladder without cholecystitis without obstruction: Secondary | ICD-10-CM | POA: Diagnosis not present

## 2020-12-24 ENCOUNTER — Telehealth: Payer: Self-pay | Admitting: Cardiology

## 2020-12-24 MED ORDER — METOPROLOL TARTRATE 25 MG PO TABS
25.0000 mg | ORAL_TABLET | Freq: Two times a day (BID) | ORAL | 0 refills | Status: DC
Start: 2020-12-24 — End: 2021-03-18

## 2020-12-24 NOTE — Telephone Encounter (Signed)
*  STAT* If patient is at the pharmacy, call can be transferred to refill team.   1. Which medications need to be refilled? (please list name of each medication and dose if known) metoprolol tartrate (LOPRESSOR) 25 MG tablet  2. Which pharmacy/location (including street and city if local pharmacy) is medication to be sent to? [facility] [place]  3. Do they need a 30 day or 90 day supply? Enough to last until appointment on 01/12/21

## 2020-12-24 NOTE — Telephone Encounter (Signed)
Medicaton refill request approved

## 2021-01-11 NOTE — Progress Notes (Deleted)
Cardiology Office Note:    Date:  01/11/2021   ID:  Hunter Sparks, DOB 27-Mar-1946, MRN [id number]  PCP:  Celene Squibb, MD   [place]  Cardiologist:  Carlyle Dolly, MD *** Advanced Practice Provider:  No care team member to display Electrophysiologist:  None  {Press F2 to show EP APP, CHF, sleep or structural heart MD               :[id number]}  { Click here to update then REFRESH NOTE - MD (PCP) or APP (Team Member)  Change PCP Type for MD, Specialty for APP is either Cardiology or Clinical Cardiac Electrophysiology  :[id number]}   Referring MD: Celene Squibb, MD   Chief Complaint:  No chief complaint on file.    Patient Profile:    Hunter Sparks is a 75 y.o. male with:   Coronary artery disease   S/p CABG in 8/16  Hypertension   Hyperlipidemia   Ex-smoker   Diabetes mellitus   Prior CV studies: *** Carotid US 05/2015 Bilateral ICA 1-39  Echocardiogram 05/2015 EF 60-65, no RWMA, Gr 1 DD, trivial MR, PASP 25, mild TR  Cardiac catheterization 06/11/15 1. Prox LAD to Mid LAD lesion, 95% stenosed. 2. 2nd Diag lesion, 70% stenosed. 3. 3rd Diag lesion, 90% stenosed. 4. Mid Cx lesion, 99% stenosed. 5. Dist Cx lesion, 40% stenosed. 6. Dist RCA lesion, 90% stenosed.   History of Present Illness:    Hunter Sparks was last seen by Dr. Harl Bowie in 04/2018.  ***      Past Medical History:  Diagnosis Date  . Angina, class II ([place])   . Arthritis    "back, hips, legs" (06/10/2015)  . Chest pain   . Chronic lower back pain   . Depression   . DJD (degenerative joint disease)   . Hyperlipidemia 06/11/2015  . Hypertension   . Hypothyroidism   . Kidney stone   . Kidney stone   . Type II diabetes mellitus ([place])     Current Medications: No outpatient medications have been marked as taking for the 01/12/21 encounter (Appointment) with Richardson Dopp T, PA-C.     Allergies:   Penicillins   Social History   Tobacco Use  . Smoking status:  Former Smoker    Packs/day: 2.00    Years: 45.00    Pack years: 90.00    Types: Cigarettes    Start date: 49    Quit date: 2005    Years since quitting: 17.1  . Smokeless tobacco: Current User    Types: Chew  Vaping Use  . Vaping Use: Never used  Substance Use Topics  . Alcohol use: No    Alcohol/week: 0.0 standard drinks  . Drug use: No     Family Hx: The patient's family history includes Cancer in his brother and father; Hypertension in his mother; Liver cancer in his father; Rectal cancer in his brother; Stomach cancer in his father; Stroke in his mother. There is no history of Heart attack.  ROS   EKGs/Labs/Other Test Reviewed:    EKG:  EKG is *** ordered today.  The ekg ordered today demonstrates ***  Recent Labs: 10/29/2020: ALT 15; BUN 17; Creatinine, Ser 1.14; Hemoglobin 14.3; Platelets 191; Potassium 4.0; Sodium 136   Recent Lipid Panel Lab Results  Component Value Date/Time   CHOL 253 (H) 01/03/2018 07:39 AM   TRIG 207 (H) 01/03/2018 07:39 AM   HDL 39 (L) 01/03/2018 07:39 AM  CHOLHDL 6.5 (H) 01/03/2018 07:39 AM   LDLCALC 177 (H) 01/03/2018 07:39 AM      Risk Assessment/Calculations:   {Does this patient have ATRIAL FIBRILLATION?:[id number]}  Physical Exam:    VS:  There were no vitals taken for this visit.    Wt Readings from Last 3 Encounters:  10/29/20 200 lb (90.7 kg)  10/27/19 205 lb (93 kg)  08/08/18 195 lb (88.5 kg)     Physical Exam ***  ASSESSMENT & PLAN:    ***  {Are you ordering a CV Procedure (e.g. stress test, cath, DCCV, TEE, etc)?   Press F2        :[id number]}    Dispo:  No follow-ups on file.   Medication Adjustments/Labs and Tests Ordered: Current medicines are reviewed at length with the patient today.  Concerns regarding medicines are outlined above.  Tests Ordered: No orders of the defined types were placed in this encounter.  Medication Changes: No orders of the defined types were placed in this  encounter.   Signed, Richardson Dopp, PA-C  01/11/2021 10:26 PM    [facility] [place], [place], [place]  [address] Phone: [phone]; Fax: [phone]

## 2021-01-12 ENCOUNTER — Ambulatory Visit: Payer: PPO | Admitting: Physician Assistant

## 2021-01-12 DIAGNOSIS — E782 Mixed hyperlipidemia: Secondary | ICD-10-CM

## 2021-01-12 DIAGNOSIS — I2510 Atherosclerotic heart disease of native coronary artery without angina pectoris: Secondary | ICD-10-CM

## 2021-01-12 DIAGNOSIS — I10 Essential (primary) hypertension: Secondary | ICD-10-CM

## 2021-01-12 DIAGNOSIS — E118 Type 2 diabetes mellitus with unspecified complications: Secondary | ICD-10-CM

## 2021-02-07 DIAGNOSIS — N1831 Chronic kidney disease, stage 3a: Secondary | ICD-10-CM | POA: Diagnosis not present

## 2021-02-07 DIAGNOSIS — E782 Mixed hyperlipidemia: Secondary | ICD-10-CM | POA: Diagnosis not present

## 2021-02-07 DIAGNOSIS — E039 Hypothyroidism, unspecified: Secondary | ICD-10-CM | POA: Diagnosis not present

## 2021-02-07 DIAGNOSIS — I10 Essential (primary) hypertension: Secondary | ICD-10-CM | POA: Diagnosis not present

## 2021-02-07 DIAGNOSIS — E1165 Type 2 diabetes mellitus with hyperglycemia: Secondary | ICD-10-CM | POA: Diagnosis not present

## 2021-02-07 DIAGNOSIS — I129 Hypertensive chronic kidney disease with stage 1 through stage 4 chronic kidney disease, or unspecified chronic kidney disease: Secondary | ICD-10-CM | POA: Diagnosis not present

## 2021-02-10 DIAGNOSIS — R944 Abnormal results of kidney function studies: Secondary | ICD-10-CM | POA: Diagnosis not present

## 2021-02-10 DIAGNOSIS — E1165 Type 2 diabetes mellitus with hyperglycemia: Secondary | ICD-10-CM | POA: Diagnosis not present

## 2021-02-10 DIAGNOSIS — K8010 Calculus of gallbladder with chronic cholecystitis without obstruction: Secondary | ICD-10-CM | POA: Diagnosis not present

## 2021-02-10 DIAGNOSIS — G720 Drug-induced myopathy: Secondary | ICD-10-CM | POA: Diagnosis not present

## 2021-02-10 DIAGNOSIS — G3184 Mild cognitive impairment, so stated: Secondary | ICD-10-CM | POA: Diagnosis not present

## 2021-02-10 DIAGNOSIS — E782 Mixed hyperlipidemia: Secondary | ICD-10-CM | POA: Diagnosis not present

## 2021-02-10 DIAGNOSIS — F331 Major depressive disorder, recurrent, moderate: Secondary | ICD-10-CM | POA: Diagnosis not present

## 2021-02-10 DIAGNOSIS — M545 Low back pain, unspecified: Secondary | ICD-10-CM | POA: Diagnosis not present

## 2021-02-10 DIAGNOSIS — N390 Urinary tract infection, site not specified: Secondary | ICD-10-CM | POA: Diagnosis not present

## 2021-03-03 DIAGNOSIS — R944 Abnormal results of kidney function studies: Secondary | ICD-10-CM | POA: Diagnosis not present

## 2021-03-03 DIAGNOSIS — M545 Low back pain, unspecified: Secondary | ICD-10-CM | POA: Diagnosis not present

## 2021-03-03 DIAGNOSIS — G720 Drug-induced myopathy: Secondary | ICD-10-CM | POA: Diagnosis not present

## 2021-03-03 DIAGNOSIS — K8010 Calculus of gallbladder with chronic cholecystitis without obstruction: Secondary | ICD-10-CM | POA: Diagnosis not present

## 2021-03-03 DIAGNOSIS — G3184 Mild cognitive impairment, so stated: Secondary | ICD-10-CM | POA: Diagnosis not present

## 2021-03-03 DIAGNOSIS — E1165 Type 2 diabetes mellitus with hyperglycemia: Secondary | ICD-10-CM | POA: Diagnosis not present

## 2021-03-03 DIAGNOSIS — F331 Major depressive disorder, recurrent, moderate: Secondary | ICD-10-CM | POA: Diagnosis not present

## 2021-03-03 DIAGNOSIS — E782 Mixed hyperlipidemia: Secondary | ICD-10-CM | POA: Diagnosis not present

## 2021-03-18 ENCOUNTER — Encounter: Payer: Self-pay | Admitting: Nurse Practitioner

## 2021-03-18 ENCOUNTER — Ambulatory Visit: Payer: HMO | Admitting: Nurse Practitioner

## 2021-03-18 ENCOUNTER — Other Ambulatory Visit: Payer: Self-pay

## 2021-03-18 VITALS — BP 152/78 | HR 58 | Ht 72.0 in | Wt 207.8 lb

## 2021-03-18 DIAGNOSIS — Z79899 Other long term (current) drug therapy: Secondary | ICD-10-CM

## 2021-03-18 DIAGNOSIS — I10 Essential (primary) hypertension: Secondary | ICD-10-CM | POA: Diagnosis not present

## 2021-03-18 DIAGNOSIS — I2510 Atherosclerotic heart disease of native coronary artery without angina pectoris: Secondary | ICD-10-CM

## 2021-03-18 DIAGNOSIS — I739 Peripheral vascular disease, unspecified: Secondary | ICD-10-CM | POA: Diagnosis not present

## 2021-03-18 DIAGNOSIS — E785 Hyperlipidemia, unspecified: Secondary | ICD-10-CM

## 2021-03-18 MED ORDER — REPATHA SURECLICK 140 MG/ML ~~LOC~~ SOAJ: 140.0000 mg | SUBCUTANEOUS | 11 refills | Status: DC

## 2021-03-18 MED ORDER — METOPROLOL TARTRATE 25 MG PO TABS
25.0000 mg | ORAL_TABLET | Freq: Two times a day (BID) | ORAL | 11 refills | Status: DC
Start: 2021-03-18 — End: 2022-12-11

## 2021-03-18 MED ORDER — LOSARTAN POTASSIUM 25 MG PO TABS: 25.0000 mg | ORAL_TABLET | Freq: Every day | ORAL | 3 refills | Status: DC

## 2021-03-18 NOTE — Patient Instructions (Signed)
Medication Instructions:  Your physician has recommended you make the following change in your medication:   Start Losartan 25 mg Daily  Start Repatha 140 mg Every other week   *If you need a refill on your cardiac medications before your next appointment, please call your pharmacy*   Lab Work: Your physician recommends that you return for lab work in: 1 Week   If you have labs (blood work) drawn today and your tests are completely normal, you will receive your results only by: Marland Kitchen MyChart Message (if you have MyChart) OR . A paper copy in the mail If you have any lab test that is abnormal or we need to change your treatment, we will call you to review the results.   Testing/Procedures: Your physician has requested that you have an ankle brachial index (ABI). During this test an ultrasound and blood pressure cuff are used to evaluate the arteries that supply the arms and legs with blood. Allow thirty minutes for this exam. There are no restrictions or special instructions.    Follow-Up: At [facility], you and your health needs are our priority.  As part of our continuing mission to provide you with exceptional heart care, we have created designated Provider Care Teams.  These Care Teams include your primary Cardiologist (physician) and Advanced Practice Providers (APPs -  Physician Assistants and Nurse Practitioners) who all work together to provide you with the care you need, when you need it.  We recommend signing up for the patient portal called "MyChart".  Sign up information is provided on this After Visit Summary.  MyChart is used to connect with patients for Virtual Visits (Telemedicine).  Patients are able to view lab/test results, encounter notes, upcoming appointments, etc.  Non-urgent messages can be sent to your provider as well.   To learn more about what you can do with MyChart, go to [email or web address].    Your next appointment:   6 month(s)  The format for your  next appointment:   In Person  Provider:   Dr. Harl Bowie      Other Instructions Thank you for choosing [facility]!

## 2021-03-18 NOTE — Progress Notes (Signed)
Office Visit    Patient Name: Hunter Sparks Date of Encounter: 03/18/2021  Primary Care Provider:  Celene Squibb, MD Primary Cardiologist:  Carlyle Dolly, MD  Chief Complaint    75 year old male with a history of CAD status post four-vessel bypass in August 2016, hypertension, hyperlipidemia, diabetes, nephrolithiasis, and hypothyroidism, who presents for follow-up related to CAD.  Past Medical History    Past Medical History:  Diagnosis Date  . Angina, class II ([place])   . Arthritis    "back, hips, legs" (06/10/2015)  . Chest pain   . Chronic lower back pain   . Depression   . DJD (degenerative joint disease)   . Hyperlipidemia 06/11/2015  . Hypertension   . Hypothyroidism   . Kidney stone   . Kidney stone   . Type II diabetes mellitus ([place])    Past Surgical History:  Procedure Laterality Date  . CARDIAC CATHETERIZATION N/A 06/11/2015   Procedure: Left Heart Cath and Coronary Angiography;  Surgeon: Belva Crome, MD;  Location: [facility];  Service: Cardiovascular;  Laterality: N/A;  . COLONOSCOPY  06/21/2011   Procedure: COLONOSCOPY;  Surgeon: Rogene Houston, MD;  [facility];  Service: Endoscopy;  Laterality: N/A;  . COLONOSCOPY    . COLONOSCOPY N/A 09/20/2017   Procedure: COLONOSCOPY;  Surgeon: Rogene Houston, MD;  [facility];  Service: Endoscopy;  Laterality: N/A;  1200  . CORONARY ARTERY BYPASS GRAFT N/A 06/14/2015   Procedure: CORONARY ARTERY BYPASS GRAFTING times four using Left Internal mammary artery and right leg Saphenous vein graft;  Surgeon: Ivin Poot, MD;  Location: [facility];  Service: Open Heart Surgery;  Laterality: N/A;  . 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 HAND SURGERY    . SKIN CANCER EXCISION Left    "forearm"  . TEE WITHOUT CARDIOVERSION N/A 06/14/2015   Procedure: TRANSESOPHAGEAL ECHOCARDIOGRAM (TEE);  Surgeon: Ivin Poot, MD;  Location: [facility];  Service: Open Heart Surgery;  Laterality: N/A;     Allergies  Allergies  Allergen Reactions  . Penicillins Rash    Has patient had a PCN reaction causing immediate rash, facial/tongue/throat swelling, SOB or lightheadedness with hypotension: Yes Has patient had a PCN reaction causing severe rash involving mucus membranes or skin necrosis: No Has patient had a PCN reaction that required hospitalization: No Has patient had a PCN reaction occurring within the last 10 years: No If all of the above answers are "NO", then may proceed with Cephalosporin use.     History of Present Illness    75 year old male with the above past medical history including coronary artery disease, hypertension, hyperlipidemia, diabetes, nephrolithiasis, and hypothyroidism.  He previously smoked cigarettes as well.  In July 2016, patient experienced unstable angina and underwent diagnostic catheterization revealing severe multivessel coronary artery disease.  He was evaluated by CT surgery underwent successful CABG x4.  Echo at that time showed an EF of 60-65%.  He did well postoperatively but was unable to tolerate statins due to myalgias.  He was last seen in cardiology clinic in June 2019.  Since his last visit, he has not experienced chest pain or dyspnea on exertion but over the past year, has been experiencing progressive bilateral calf claudication.  Discomfort will start after walking just 25 to 50 yards but he believes he can probably walk 2 to 300 yards prior to having to rest.  His feet are often cold but he has not noted  any discoloration or change in hair distribution.  No lower extremity ulcerations.  He denies palpitations, PND, orthopnea, dizziness, syncope, edema, or early satiety.  Home Medications    Prior to Admission medications   Medication Sig Start Date End Date Taking? Authorizing Provider  blood glucose meter kit and supplies 1 each by Other route 4 (four) times daily. Dispense based on patient and insurance preference. Use up to four  times daily as directed. (FOR ICD-10 E10.9, E11.9). 01/16/20   Cassandria Anger, MD  Cholecalciferol (VITAMIN D) 125 MCG (5000 UT) CAPS Take 5,000 Units by mouth daily.    [provider]  glipiZIDE (GLUCOTROL XL) 5 MG 24 hr tablet Take 1 tablet (5 mg total) by mouth daily with breakfast. 01/21/20   Nida, Marella Chimes, MD  glucose blood (ONETOUCH VERIO) test strip 1 each by Other route 4 (four) times daily. Use as instructed 10/27/19   Cassandria Anger, MD  Insulin Glargine, 1 Unit Dial, 300 UNIT/ML SOPN Inject 54 Units into the skin at bedtime. 11/12/19   Cassandria Anger, MD  Insulin Pen Needle (B-D ULTRAFINE III SHORT PEN) 31G X 8 MM MISC 1 each by Does not apply route as directed. 10/27/19   Cassandria Anger, MD  levothyroxine (SYNTHROID) 75 MCG tablet TAKE 1 TABLET BY MOUTH ONCE DAILY BEFORE BREAKFAST 10/16/19   Cassandria Anger, MD  metoprolol tartrate (LOPRESSOR) 25 MG tablet Take 1 tablet (25 mg total) by mouth 2 (two) times daily. 12/24/20   Arnoldo Lenis, MD  traMADol (ULTRAM) 50 MG tablet Take 1 tablet (50 mg total) by mouth every 6 (six) hours as needed. pain 06/19/15   Barrett, Lodema Hong, PA-C    Review of Systems    Bilateral calf claudication as outlined above.  He denies chest pain, dyspnea, palpitations, PND, orthopnea, dizziness, syncope, edema, or early satiety.  All other systems reviewed and are otherwise negative except as noted above.  Physical Exam    VS:  BP (!) 152/78   Pulse (!) 58   Ht 6' (1.829 m)   Wt 207 lb 12.8 oz (94.3 kg)   SpO2 99%   BMI 28.18 kg/m  , BMI Body mass index is 28.18 kg/m. GEN: Well nourished, well developed, in no acute distress. HEENT: normal. Neck: Supple, no JVD, carotid bruits, or masses. Cardiac: RRR, no murmurs, rubs, or gallops. No clubbing, cyanosis, trace bilateral ankle edema.  Radials 2+.  DP/PT 1+ + and equal bilaterally.  Respiratory:  Respirations regular and unlabored, clear to auscultation  bilaterally. GI: Soft, nontender, nondistended, BS + x 4. MS: no deformity or atrophy. Skin: warm and dry, no rash. Neuro:  Strength and sensation are intact. Psych: Normal affect.  Accessory Clinical Findings    ECG personally reviewed by me today -sinus bradycardia, 54 - no acute changes.  Lab Results  Component Value Date   WBC 9.3 10/29/2020   HGB 14.3 10/29/2020   HCT 43.0 10/29/2020   MCV 91.5 10/29/2020   PLT 191 10/29/2020   Lab Results  Component Value Date   CREATININE 1.14 10/29/2020   BUN 17 10/29/2020   NA 136 10/29/2020   K 4.0 10/29/2020   CL 101 10/29/2020   CO2 25 10/29/2020   Lab Results  Component Value Date   ALT 15 10/29/2020   AST 14 (L) 10/29/2020   ALKPHOS 73 10/29/2020   BILITOT 0.8 10/29/2020   Lab Results  Component Value Date   CHOL 253 (H)  01/03/2018   HDL 39 (L) 01/03/2018   LDLCALC 177 (H) 01/03/2018   TRIG 207 (H) 01/03/2018   CHOLHDL 6.5 (H) 01/03/2018    Lab Results  Component Value Date   HGBA1C 9.2 (A) 10/27/2019    Assessment & Plan    1.  Coronary artery disease: Status post CABG x4 in 2016.  He was last seen here in 2019 and reports having done well without chest pain or dyspnea.  He does not tolerate aspirin secondary to GI upset and is not interested in trying an alternate antiplatelet such as Plavix.  He remains on beta-blocker therapy.  He is intolerant to statins but is willing to pursue PCSK9 inhibitor therapy and therefore Repatha was prescribed today.  2.  Bilateral lower extremity claudication: Patient has been having bilateral calf claudication over the past 8 to 12 months.  He believes he can probably walk 200 to 300 yards prior to having stopped secondary to pain.  He has diminished pulses bilaterally.  We will arrange for ABIs and peripheral vascular referral if necessary.  3.  Essential hypertension: Blood pressure elevated today at 152/78.  He does not routinely check this at home.  He is willing to accept a  prescription for losartan 25 mg daily and will follow up a basic metabolic panel next week.  Refilling metoprolol today.  4.  Hyperlipidemia: Intolerant to statins secondary to myalgias.  Willing to accept a prescription for Repatha and if he can afford it, he will plan to take.  5.  Type 2 diabetes mellitus: Insulin therapy per primary care  6.  Hypothyroidism: On levothyroxine and followed by primary care.  7.  Disposition: Follow-up ABIs.  Follow-up basic metabolic panel next week in the setting of new ARB therapy.  We will arrange for cardiology follow-up in 6 months but suspect he will need peripheral vascular referral prior to that, pending ABIs.  Murray Hodgkins, NP 03/18/2021, 4:35 PM

## 2021-03-21 NOTE — Addendum Note (Signed)
Addended by: Barbarann Ehlers A on: 03/21/2021 08:34 AM   Modules accepted: Orders

## 2021-03-28 ENCOUNTER — Other Ambulatory Visit: Payer: Self-pay

## 2021-03-28 ENCOUNTER — Other Ambulatory Visit (HOSPITAL_COMMUNITY)
Admission: RE | Admit: 2021-03-28 | Discharge: 2021-03-28 | Disposition: A | Discharge status: Home / Self Care | Payer: HMO | Source: Ambulatory Visit | Attending: Nurse Practitioner | Admitting: Nurse Practitioner

## 2021-03-28 DIAGNOSIS — IMO0002 Reserved for concepts with insufficient information to code with codable children: Secondary | ICD-10-CM

## 2021-03-28 DIAGNOSIS — I10 Essential (primary) hypertension: Secondary | ICD-10-CM

## 2021-03-28 DIAGNOSIS — E1165 Type 2 diabetes mellitus with hyperglycemia: Secondary | ICD-10-CM

## 2021-03-28 DIAGNOSIS — Z79899 Other long term (current) drug therapy: Secondary | ICD-10-CM | POA: Insufficient documentation

## 2021-03-28 DIAGNOSIS — E1159 Type 2 diabetes mellitus with other circulatory complications: Secondary | ICD-10-CM

## 2021-03-28 LAB — BASIC METABOLIC PANEL
Anion gap: 6 (ref 5–15)
BUN: 18 mg/dL (ref 8–23)
CO2: 29 mmol/L (ref 22–32)
Calcium: 8.8 mg/dL — ABNORMAL LOW (ref 8.9–10.3)
Chloride: 105 mmol/L (ref 98–111)
Creatinine, Ser: 1.28 mg/dL — ABNORMAL HIGH (ref 0.61–1.24)
GFR, Estimated: 58 mL/min — ABNORMAL LOW (ref >60)
Glucose, Bld: 161 mg/dL — ABNORMAL HIGH (ref 70–99)
Potassium: 4.2 mmol/L (ref 3.5–5.1)
Sodium: 140 mmol/L (ref 135–145)

## 2021-03-30 ENCOUNTER — Telehealth: Payer: Self-pay | Admitting: Nurse Practitioner

## 2021-03-30 DIAGNOSIS — E1159 Type 2 diabetes mellitus with other circulatory complications: Secondary | ICD-10-CM

## 2021-03-30 DIAGNOSIS — E782 Mixed hyperlipidemia: Secondary | ICD-10-CM

## 2021-03-30 DIAGNOSIS — I10 Essential (primary) hypertension: Secondary | ICD-10-CM

## 2021-03-30 NOTE — Telephone Encounter (Signed)
   [employer] calling to check status of prior auth for repatha

## 2021-03-30 NOTE — Telephone Encounter (Signed)
Spoke to [place] at [employer] who stated that [employer] needed the ICD 10 code for pt diagnosis for Repatha prior authorization approval.

## 2021-03-30 NOTE — Telephone Encounter (Signed)
Please review

## 2021-03-31 NOTE — Telephone Encounter (Signed)
[facility] [place] faxed me the repatha denial. I will work on appeal and update the pt as I get a determination

## 2021-04-04 MED ORDER — REPATHA SURECLICK 140 MG/ML ~~LOC~~ SOAJ: 140.0000 mg | SUBCUTANEOUS | 11 refills | Status: DC

## 2021-04-04 NOTE — Telephone Encounter (Signed)
Per fax received from [employer] has been approved through 06/30/21.

## 2021-04-04 NOTE — Telephone Encounter (Signed)
Called and lmom pt stated that the repatha was approved, rx sent, instructed to complete fasting labs post 4th dose

## 2021-04-04 NOTE — Addendum Note (Signed)
Addended by: Allean Found on: 04/04/2021 09:56 AM   Modules accepted: Orders

## 2021-04-06 ENCOUNTER — Telehealth: Payer: Self-pay | Admitting: Nurse Practitioner

## 2021-04-06 NOTE — Telephone Encounter (Signed)
Linda(wife) called said they had trouble with the Evolocumab (REPATHA SURECLICK) 068 MG/ML SOAJ that was ordered. She is not sure how much if any he got of this 1st dose. ? when he should do another injection. Was instructed to do every 2 wks. She said [facility] is sending her a new injector because the one she has is defective.Please call her [phone].

## 2021-04-06 NOTE — Telephone Encounter (Signed)
Please review note regarding damaged Repatha.

## 2021-04-06 NOTE — Telephone Encounter (Signed)
Received fax from [facility] for authorization to replace damaged auto-injector. Will fax to Dr. Harl Bowie in [place] for signature.

## 2021-04-06 NOTE — Telephone Encounter (Signed)
Heather from [facility] States they need to replace damaged Repatha  Please call [phone] Case # [id number]

## 2021-04-06 NOTE — Telephone Encounter (Signed)
Pt tried to inject Repatha today for the first time and has faulty auto-injector. Repatha to replace medication.  Pt spouse wanted to know if they should give another injection asap, but being unable to determine the amount of medication spouse received from faulty injector, I advised patient that it was probably best to wait until it was time for his next injection which will be in 2 weeks. She voiced understanding and had no other questions or concerns at this time.

## 2021-04-12 ENCOUNTER — Other Ambulatory Visit (HOSPITAL_COMMUNITY)
Admission: RE | Admit: 2021-04-12 | Discharge: 2021-04-12 | Disposition: A | Discharge status: Home / Self Care | Payer: HMO | Source: Ambulatory Visit | Attending: Nurse Practitioner | Admitting: Nurse Practitioner

## 2021-04-12 DIAGNOSIS — E782 Mixed hyperlipidemia: Secondary | ICD-10-CM | POA: Diagnosis not present

## 2021-04-12 LAB — LIPID PANEL
Cholesterol: 204 mg/dL — ABNORMAL HIGH (ref 0–200)
HDL: 37 mg/dL — ABNORMAL LOW (ref >40)
LDL Cholesterol: 124 mg/dL — ABNORMAL HIGH (ref 0–99)
Total CHOL/HDL Ratio: 5.5 RATIO
Triglycerides: 216 mg/dL — ABNORMAL HIGH (ref <150)
VLDL: 43 mg/dL — ABNORMAL HIGH (ref 0–40)

## 2021-04-12 LAB — HEPATIC FUNCTION PANEL
ALT: 15 U/L (ref 0–44)
AST: 17 U/L (ref 15–41)
Albumin: 3.9 g/dL (ref 3.5–5.0)
Alkaline Phosphatase: 53 U/L (ref 38–126)
Bilirubin, Direct: 0.1 mg/dL (ref 0.0–0.2)
Indirect Bilirubin: 0.7 mg/dL (ref 0.3–0.9)
Total Bilirubin: 0.8 mg/dL (ref 0.3–1.2)
Total Protein: 7 g/dL (ref 6.5–8.1)

## 2021-04-18 ENCOUNTER — Other Ambulatory Visit: Payer: Self-pay

## 2021-04-18 DIAGNOSIS — I739 Peripheral vascular disease, unspecified: Secondary | ICD-10-CM

## 2021-04-19 ENCOUNTER — Ambulatory Visit (INDEPENDENT_AMBULATORY_CARE_PROVIDER_SITE_OTHER): Payer: HMO

## 2021-04-19 DIAGNOSIS — I739 Peripheral vascular disease, unspecified: Secondary | ICD-10-CM

## 2021-08-08 DIAGNOSIS — F329 Major depressive disorder, single episode, unspecified: Secondary | ICD-10-CM | POA: Insufficient documentation

## 2021-08-09 DIAGNOSIS — F32A Depression, unspecified: Secondary | ICD-10-CM | POA: Diagnosis not present

## 2021-08-09 DIAGNOSIS — M545 Low back pain, unspecified: Secondary | ICD-10-CM | POA: Diagnosis not present

## 2021-08-09 DIAGNOSIS — R944 Abnormal results of kidney function studies: Secondary | ICD-10-CM | POA: Diagnosis not present

## 2021-08-09 DIAGNOSIS — M79605 Pain in left leg: Secondary | ICD-10-CM | POA: Diagnosis not present

## 2021-08-09 DIAGNOSIS — R269 Unspecified abnormalities of gait and mobility: Secondary | ICD-10-CM | POA: Diagnosis not present

## 2021-08-09 DIAGNOSIS — E1165 Type 2 diabetes mellitus with hyperglycemia: Secondary | ICD-10-CM | POA: Diagnosis not present

## 2021-08-09 DIAGNOSIS — E782 Mixed hyperlipidemia: Secondary | ICD-10-CM | POA: Diagnosis not present

## 2021-08-09 DIAGNOSIS — E039 Hypothyroidism, unspecified: Secondary | ICD-10-CM | POA: Diagnosis not present

## 2021-08-09 DIAGNOSIS — G3184 Mild cognitive impairment, so stated: Secondary | ICD-10-CM | POA: Diagnosis not present

## 2021-08-12 DIAGNOSIS — E1165 Type 2 diabetes mellitus with hyperglycemia: Secondary | ICD-10-CM | POA: Diagnosis not present

## 2021-08-12 DIAGNOSIS — I10 Essential (primary) hypertension: Secondary | ICD-10-CM | POA: Diagnosis not present

## 2021-09-05 DIAGNOSIS — I10 Essential (primary) hypertension: Secondary | ICD-10-CM | POA: Diagnosis not present

## 2021-09-05 DIAGNOSIS — E039 Hypothyroidism, unspecified: Secondary | ICD-10-CM | POA: Diagnosis not present

## 2021-09-08 DIAGNOSIS — E039 Hypothyroidism, unspecified: Secondary | ICD-10-CM | POA: Diagnosis not present

## 2021-09-08 DIAGNOSIS — M545 Low back pain, unspecified: Secondary | ICD-10-CM | POA: Diagnosis not present

## 2021-09-08 DIAGNOSIS — K429 Umbilical hernia without obstruction or gangrene: Secondary | ICD-10-CM | POA: Diagnosis not present

## 2021-09-08 DIAGNOSIS — E1165 Type 2 diabetes mellitus with hyperglycemia: Secondary | ICD-10-CM | POA: Diagnosis not present

## 2021-09-08 DIAGNOSIS — Z0001 Encounter for general adult medical examination with abnormal findings: Secondary | ICD-10-CM | POA: Diagnosis not present

## 2021-09-08 DIAGNOSIS — R269 Unspecified abnormalities of gait and mobility: Secondary | ICD-10-CM | POA: Diagnosis not present

## 2021-09-08 DIAGNOSIS — M79605 Pain in left leg: Secondary | ICD-10-CM | POA: Diagnosis not present

## 2021-09-08 DIAGNOSIS — I25810 Atherosclerosis of coronary artery bypass graft(s) without angina pectoris: Secondary | ICD-10-CM | POA: Diagnosis not present

## 2021-09-08 DIAGNOSIS — R296 Repeated falls: Secondary | ICD-10-CM | POA: Diagnosis not present

## 2021-09-08 DIAGNOSIS — R944 Abnormal results of kidney function studies: Secondary | ICD-10-CM | POA: Diagnosis not present

## 2021-09-08 DIAGNOSIS — G3184 Mild cognitive impairment, so stated: Secondary | ICD-10-CM | POA: Diagnosis not present

## 2021-09-08 DIAGNOSIS — F32A Depression, unspecified: Secondary | ICD-10-CM | POA: Diagnosis not present

## 2021-09-12 ENCOUNTER — Telehealth: Payer: Self-pay | Admitting: Cardiology

## 2021-09-12 NOTE — Telephone Encounter (Signed)
Patient's wife called stating he can't taking anything for his cholesterol.  She cancelled his upcoming appt (11/4), because it he can't take Repatha.  If it's necessary she said a phone visit would be fine.  She states he is not having any heart trouble at this time, so an appt for next year would be fine.

## 2021-09-12 NOTE — Telephone Encounter (Signed)
Pt's wife states that Repatha caused pt to itch, joint pain and a headache. They would like to cancel at this time because they feel he can not take anything for his cholesterol. Wife states that they will wait until next to to see Dr. Harl Bowie.

## 2021-09-13 NOTE — Telephone Encounter (Signed)
Returned call to pt. No answer. Left msg to call office,

## 2021-09-13 NOTE — Telephone Encounter (Signed)
Spoke with wife who states that the pt is not interested in going to the lipid clinic or trying another medication at this time. They will call back if pt changes his mind.

## 2021-09-13 NOTE — Telephone Encounter (Signed)
If he is intersted we could set him up with lipd clinic, there is a new medicine called leqvio which could be another option they could discuss with him   Zandra Abts MD

## 2021-09-16 ENCOUNTER — Ambulatory Visit: Payer: HMO | Admitting: Cardiology

## 2021-11-24 DIAGNOSIS — M545 Low back pain, unspecified: Secondary | ICD-10-CM | POA: Diagnosis not present

## 2021-12-03 ENCOUNTER — Emergency Department (HOSPITAL_COMMUNITY): Payer: HMO

## 2021-12-03 ENCOUNTER — Encounter (HOSPITAL_COMMUNITY): Payer: Self-pay | Admitting: *Deleted

## 2021-12-03 ENCOUNTER — Emergency Department (HOSPITAL_COMMUNITY)
Admission: EM | Admit: 2021-12-03 | Discharge: 2021-12-03 | Disposition: A | Discharge status: Home / Self Care | Payer: HMO | Attending: Emergency Medicine | Admitting: Emergency Medicine

## 2021-12-03 DIAGNOSIS — I10 Essential (primary) hypertension: Secondary | ICD-10-CM | POA: Diagnosis not present

## 2021-12-03 DIAGNOSIS — K5730 Diverticulosis of large intestine without perforation or abscess without bleeding: Secondary | ICD-10-CM | POA: Diagnosis not present

## 2021-12-03 DIAGNOSIS — Z7984 Long term (current) use of oral hypoglycemic drugs: Secondary | ICD-10-CM | POA: Insufficient documentation

## 2021-12-03 DIAGNOSIS — N200 Calculus of kidney: Secondary | ICD-10-CM | POA: Diagnosis not present

## 2021-12-03 DIAGNOSIS — E119 Type 2 diabetes mellitus without complications: Secondary | ICD-10-CM | POA: Insufficient documentation

## 2021-12-03 DIAGNOSIS — M545 Low back pain, unspecified: Secondary | ICD-10-CM | POA: Insufficient documentation

## 2021-12-03 DIAGNOSIS — K8020 Calculus of gallbladder without cholecystitis without obstruction: Secondary | ICD-10-CM | POA: Diagnosis not present

## 2021-12-03 DIAGNOSIS — Z79899 Other long term (current) drug therapy: Secondary | ICD-10-CM | POA: Insufficient documentation

## 2021-12-03 DIAGNOSIS — Z794 Long term (current) use of insulin: Secondary | ICD-10-CM | POA: Insufficient documentation

## 2021-12-03 DIAGNOSIS — R109 Unspecified abdominal pain: Secondary | ICD-10-CM | POA: Insufficient documentation

## 2021-12-03 DIAGNOSIS — M47816 Spondylosis without myelopathy or radiculopathy, lumbar region: Secondary | ICD-10-CM | POA: Diagnosis not present

## 2021-12-03 DIAGNOSIS — M48061 Spinal stenosis, lumbar region without neurogenic claudication: Secondary | ICD-10-CM | POA: Diagnosis not present

## 2021-12-03 DIAGNOSIS — M5441 Lumbago with sciatica, right side: Secondary | ICD-10-CM

## 2021-12-03 DIAGNOSIS — I700 Atherosclerosis of aorta: Secondary | ICD-10-CM | POA: Diagnosis not present

## 2021-12-03 LAB — COMPREHENSIVE METABOLIC PANEL
ALT: 17 U/L (ref 0–44)
AST: 15 U/L (ref 15–41)
Albumin: 3.9 g/dL (ref 3.5–5.0)
Alkaline Phosphatase: 60 U/L (ref 38–126)
Anion gap: 6 (ref 5–15)
BUN: 22 mg/dL (ref 8–23)
CO2: 29 mmol/L (ref 22–32)
Calcium: 8.8 mg/dL — ABNORMAL LOW (ref 8.9–10.3)
Chloride: 99 mmol/L (ref 98–111)
Creatinine, Ser: 1.34 mg/dL — ABNORMAL HIGH (ref 0.61–1.24)
GFR, Estimated: 55 mL/min — ABNORMAL LOW (ref >60)
Glucose, Bld: 215 mg/dL — ABNORMAL HIGH (ref 70–99)
Potassium: 3.8 mmol/L (ref 3.5–5.1)
Sodium: 134 mmol/L — ABNORMAL LOW (ref 135–145)
Total Bilirubin: 0.7 mg/dL (ref 0.3–1.2)
Total Protein: 7 g/dL (ref 6.5–8.1)

## 2021-12-03 LAB — URINALYSIS, ROUTINE W REFLEX MICROSCOPIC
Bilirubin Urine: NEGATIVE
Glucose, UA: 50 mg/dL — AB
Hgb urine dipstick: NEGATIVE
Ketones, ur: NEGATIVE mg/dL
Leukocytes,Ua: NEGATIVE
Nitrite: NEGATIVE
Protein, ur: NEGATIVE mg/dL
Specific Gravity, Urine: 1.017 (ref 1.005–1.030)
pH: 5 (ref 5.0–8.0)

## 2021-12-03 LAB — CBC
HCT: 44.1 % (ref 39.0–52.0)
Hemoglobin: 15 g/dL (ref 13.0–17.0)
MCH: 31 pg (ref 26.0–34.0)
MCHC: 34 g/dL (ref 30.0–36.0)
MCV: 91.1 fL (ref 80.0–100.0)
Platelets: 194 10*3/uL (ref 150–400)
RBC: 4.84 MIL/uL (ref 4.22–5.81)
RDW: 12.4 % (ref 11.5–15.5)
WBC: 10.3 10*3/uL (ref 4.0–10.5)
nRBC: 0 % (ref 0.0–0.2)

## 2021-12-03 LAB — LIPASE, BLOOD: Lipase: 40 U/L (ref 11–51)

## 2021-12-03 MED ORDER — LIDOCAINE 5 % EX PTCH: 1.0000 | MEDICATED_PATCH | CUTANEOUS | 0 refills | Status: DC

## 2021-12-03 NOTE — ED Provider Notes (Signed)
[facility] EMERGENCY DEPARTMENT Provider Note   CSN: [id number] Arrival date & time: 12/03/21  1106     History  Chief Complaint  Patient presents with   Flank Pain    Hunter Sparks is a 76 y.o. male.   Flank Pain Pertinent negatives include no chest pain, no abdominal pain and no shortness of breath.      Hunter Sparks is a 76 y.o. male with past medical history of type 2 diabetes, angina, hypertension, chronic back pain and hyperlipidemia who presents to the Emergency Department complaining of lower back pain with bilateral flank pain.  Symptoms have been gradually worsening for 1 month.  He states that his pain is worse with movement and improves at rest.  He describes a sharp shooting pain that radiates into his both upper legs with right worse than left.  States his PCP has treated him for chronic back pain and that he takes tramadol with minimal to no relief.  He denies recent injury although he states that his primary care provider tries to give him Repatha, but every time he takes it it causes him to fall.  He denies fever, chills, numbness or weakness of his lower extremities, abdominal pain, urine or bowel changes.  No saddle anesthesias.  States he is here for MRI.      Home Medications Prior to Admission medications   Medication Sig Start Date End Date Taking? Authorizing Provider  blood glucose meter kit and supplies 1 each by Other route 4 (four) times daily. Dispense based on patient and insurance preference. Use up to four times daily as directed. (FOR ICD-10 E10.9, E11.9). 01/16/20   Cassandria Anger, MD  Evolocumab (REPATHA SURECLICK) 224 MG/ML SOAJ Inject 140 mg into the skin every 14 (fourteen) days. 04/04/21   Theora Gianotti, NP  glipiZIDE (GLUCOTROL XL) 5 MG 24 hr tablet Take 1 tablet (5 mg total) by mouth daily with breakfast. 01/21/20   Nida, Marella Chimes, MD  glucose blood (ONETOUCH VERIO) test strip 1 each by Other route 4 (four)  times daily. Use as instructed 10/27/19   Cassandria Anger, MD  Insulin Glargine, 1 Unit Dial, 300 UNIT/ML SOPN Inject 54 Units into the skin at bedtime. 11/12/19   Cassandria Anger, MD  Insulin Pen Needle (B-D ULTRAFINE III SHORT PEN) 31G X 8 MM MISC 1 each by Does not apply route as directed. 10/27/19   Cassandria Anger, MD  levothyroxine (SYNTHROID) 75 MCG tablet TAKE 1 TABLET BY MOUTH ONCE DAILY BEFORE BREAKFAST 10/16/19   Cassandria Anger, MD  losartan (COZAAR) 25 MG tablet Take 1 tablet (25 mg total) by mouth daily. 03/18/21 06/16/21  Theora Gianotti, NP  metoprolol tartrate (LOPRESSOR) 25 MG tablet Take 1 tablet (25 mg total) by mouth 2 (two) times daily. 03/18/21   Theora Gianotti, NP  traMADol (ULTRAM) 50 MG tablet Take 1 tablet (50 mg total) by mouth every 6 (six) hours as needed. pain 06/19/15   Barrett, Erin R, PA-C      Allergies    Penicillins    Review of Systems   Review of Systems  Constitutional:  Negative for chills and fever.  Respiratory:  Negative for shortness of breath.   Cardiovascular:  Negative for chest pain.  Gastrointestinal:  Negative for abdominal pain, nausea and vomiting.  Genitourinary:  Positive for flank pain. Negative for difficulty urinating.  Musculoskeletal:  Positive for back pain.  Neurological:  Negative for weakness and  numbness.  All other systems reviewed and are negative.  Physical Exam Updated Vital Signs BP (!) 195/93    Pulse 89    Temp 98.5 F (36.9 C) (Oral)    Resp 18    SpO2 97%  Physical Exam Vitals and nursing note reviewed.  Constitutional:      Appearance: Normal appearance. He is not ill-appearing or toxic-appearing.  Cardiovascular:     Rate and Rhythm: Normal rate and regular rhythm.     Pulses: Normal pulses.  Pulmonary:     Effort: Pulmonary effort is normal.     Breath sounds: Normal breath sounds.  Abdominal:     Palpations: Abdomen is soft.     Tenderness: There is no abdominal  tenderness. There is no right CVA tenderness or left CVA tenderness.  Musculoskeletal:        General: Tenderness present. No signs of injury.     Right lower leg: No edema.     Left lower leg: No edema.     Comments: Diffuse tenderness to palpation over the lower lumbar spine and bilateral paraspinal muscles.  Some tenderness at the right SI joint as well.  Hip flexors and extensors are intact.    Skin:    General: Skin is warm.     Capillary Refill: Capillary refill takes less than 2 seconds.     Findings: No rash.  Neurological:     General: No focal deficit present.     Mental Status: He is alert.     Sensory: Sensation is intact. No sensory deficit.     Motor: Motor function is intact. No weakness.     Coordination: Coordination is intact.     Gait: Gait is intact.    ED Results / Procedures / Treatments   Labs (all labs ordered are listed, but only abnormal results are displayed) Labs Reviewed  COMPREHENSIVE METABOLIC PANEL - Abnormal; Notable for the following components:      Result Value   Sodium 134 (*)    Glucose, Bld 215 (*)    Creatinine, Ser 1.34 (*)    Calcium 8.8 (*)    GFR, Estimated 55 (*)    All other components within normal limits  URINALYSIS, ROUTINE W REFLEX MICROSCOPIC - Abnormal; Notable for the following components:   Glucose, UA 50 (*)    All other components within normal limits  LIPASE, BLOOD  CBC    EKG None  Radiology CT L-SPINE NO CHARGE  Result Date: 12/03/2021 CLINICAL DATA:  Flank pain EXAM: CT LUMBAR SPINE WITHOUT CONTRAST TECHNIQUE: Multidetector CT imaging of the lumbar spine was performed without intravenous contrast administration. Multiplanar CT image reconstructions were also generated. RADIATION DOSE REDUCTION: This exam was performed according to the departmental dose-optimization program which includes automated exposure control, adjustment of the mA and/or kV according to patient size and/or use of iterative reconstruction  technique. COMPARISON:  CT abdomen pelvis 12/03/2021. MRI lumbar spine 10/08/2019 FINDINGS: Segmentation: 5 lumbar vertebra. Alignment: Mild retrolisthesis L1-2 and L2-3 Vertebrae: Negative for fracture or mass Paraspinal and other soft tissues: CT abdomen pelvis reported separately from today. Disc levels: T12-L1: Negative for stenosis L1-2: Disc degeneration with endplate spurring and bilateral facet degeneration. Mild foraminal narrowing bilaterally. L2-3: Disc bulging and facet degeneration.  Negative for stenosis L3-4: Disc bulging and mild facet degeneration. Mild spinal stenosis. Mild subarticular stenosis bilaterally L4-5: Disc bulging and moderate facet degeneration. Mild subarticular stenosis bilaterally L5-S1: Bilateral facet degeneration. Mild subarticular stenosis bilaterally. IMPRESSION: 1. No  acute abnormality.  Negative for lumbar fracture 2. Mild lumbar degenerative change with mild stenosis as above. Electronically Signed   By: Franchot Gallo M.D.   On: 12/03/2021 17:34   CT Renal Stone Study  Result Date: 12/03/2021 CLINICAL DATA:  Flank pain EXAM: CT ABDOMEN AND PELVIS WITHOUT CONTRAST TECHNIQUE: Multidetector CT imaging of the abdomen and pelvis was performed following the standard protocol without IV contrast. RADIATION DOSE REDUCTION: This exam was performed according to the departmental dose-optimization program which includes automated exposure control, adjustment of the mA and/or kV according to patient size and/or use of iterative reconstruction technique. COMPARISON:  CT 10/29/2020 FINDINGS: Lower chest: Lung bases demonstrate no acute consolidation or pleural effusion. Hepatobiliary: Multiple gallstones. No biliary dilatation. No focal hepatic abnormality. Pancreas: Unremarkable. No pancreatic ductal dilatation or surrounding inflammatory changes. Spleen: Normal in size without focal abnormality. Adrenals/Urinary Tract: Adrenal glands are normal. Kidneys show no hydronephrosis.  Bilateral nonobstructing kidney stones measuring up to 7 mm lower pole left and 4 mm upper pole right. Subcentimeter exophytic hypodensity upper pole left kidney too small to further characterize. 11 mm slightly dense exophytic lesion upper pole right kidney, cannot be further characterized without contrast. Urinary bladder unremarkable Stomach/Bowel: Stomach is within normal limits. Appendix appears normal. No evidence of bowel wall thickening, distention, or inflammatory changes. Vascular/Lymphatic: Advanced aortic atherosclerosis. No aneurysm. No suspicious lymph node Reproductive: Prostate is unremarkable. Other: Negative for pelvic effusion or free air Musculoskeletal: Chronic wedging at T12. Degenerative changes, see separately dictated lumbar spine report. IMPRESSION: 1. Negative for hydronephrosis or ureteral stone. There are nonobstructing bilateral kidney stones. 2. Gallstones without acute inflammatory process in the right upper quadrant 3. Scattered colon diverticular disease without acute wall thickening. Electronically Signed   By: Donavan Foil M.D.   On: 12/03/2021 17:23    Procedures Procedures    Medications Ordered in ED Medications - No data to display  ED Course/ Medical Decision Making/ A&P                           Medical Decision Making Amount and/or Complexity of Data Reviewed Labs: ordered. Radiology: ordered.  Risk Prescription drug management.   This patient presents to the ED for concern of low back pain and flank pain, this involves an extensive number of treatment options, and is a complaint that carries with it a high risk of complications and morbidity.  The differential diagnosis includes exacerbation of chronic back pain, kidney stone, cauda equina, pyelonephritis   Co morbidities that complicate the patient evaluation  Chronic back pain, diabetes history of kidney stones   Additional history obtained:  Additional history obtained from prior medical  records  Lab Tests:  I Ordered, and personally interpreted labs.  The pertinent results include: Labs reassuring, no leukocytosis, lipase unremarkable.  Serum creatinine slightly elevated but near baseline.  Urinalysis without evidence of infection or hematuria.   Imaging Studies ordered:  I ordered imaging studies including CT renal stone study and L-spine with no charge I independently visualized and interpreted imaging which showed negative for hydronephrosis or ureteral stone.  Mild degenerative changes of the lumbar spine with mild stenosis I agree with the radiologist interpretation    Reevaluation:  After the interventions noted above, I reevaluated the patient and found that they have : Remained the same  Patient has gotten dressed and wanting to leave.  I have encouraged him to return to the room to discuss test results.  Dispostion:  After consideration of the diagnostic results, he currently takes tramadol for his back pain which she states does not help.  Patient agreeable to prescription for lidocaine patch to help with pain.  He is also requesting referral information for neurosurgery.  This will be provided.  I have discussed CT findings with the patient.  No concerning symptoms for emergent process or cauda equina.  I feel that he is appropriate for discharge home he will follow-up with PCP and follow-up information for neurosurgery given.        Final Clinical Impression(s) / ED Diagnoses Final diagnoses:  None    Rx / DC Orders ED Discharge Orders     None         Kem Parkinson, PA-C 12/03/21 1847    Noemi Chapel, MD 12/03/21 2240

## 2021-12-03 NOTE — ED Notes (Signed)
Patient refused vital signs and wanted to leave.

## 2021-12-03 NOTE — Discharge Instructions (Signed)
Apply the lidocaine patch as directed.  Follow-up with your primary doctor or you may contact the neurosurgeon listed.  Return to the emergency department for any new or worsening symptoms.

## 2021-12-03 NOTE — ED Triage Notes (Signed)
Bilateral flank pain

## 2021-12-03 NOTE — ED Notes (Signed)
Pt's pacing in and out of the room to the hallways. Informed pt and son that provider is coming to discuss test results. Son nodded. Pt still paces around the hallway, security called by tech to redirect son and pt. Tammy PA now talking to pt and son in the room.

## 2021-12-07 DIAGNOSIS — E1165 Type 2 diabetes mellitus with hyperglycemia: Secondary | ICD-10-CM | POA: Diagnosis not present

## 2021-12-07 DIAGNOSIS — I10 Essential (primary) hypertension: Secondary | ICD-10-CM | POA: Diagnosis not present

## 2021-12-07 DIAGNOSIS — E039 Hypothyroidism, unspecified: Secondary | ICD-10-CM | POA: Diagnosis not present

## 2021-12-13 DIAGNOSIS — E782 Mixed hyperlipidemia: Secondary | ICD-10-CM | POA: Diagnosis not present

## 2021-12-13 DIAGNOSIS — K429 Umbilical hernia without obstruction or gangrene: Secondary | ICD-10-CM | POA: Diagnosis not present

## 2021-12-13 DIAGNOSIS — R944 Abnormal results of kidney function studies: Secondary | ICD-10-CM | POA: Diagnosis not present

## 2021-12-13 DIAGNOSIS — I25810 Atherosclerosis of coronary artery bypass graft(s) without angina pectoris: Secondary | ICD-10-CM | POA: Diagnosis not present

## 2021-12-13 DIAGNOSIS — M545 Low back pain, unspecified: Secondary | ICD-10-CM | POA: Diagnosis not present

## 2021-12-13 DIAGNOSIS — R269 Unspecified abnormalities of gait and mobility: Secondary | ICD-10-CM | POA: Diagnosis not present

## 2021-12-13 DIAGNOSIS — G3184 Mild cognitive impairment, so stated: Secondary | ICD-10-CM | POA: Diagnosis not present

## 2021-12-13 DIAGNOSIS — E1165 Type 2 diabetes mellitus with hyperglycemia: Secondary | ICD-10-CM | POA: Diagnosis not present

## 2021-12-13 DIAGNOSIS — R296 Repeated falls: Secondary | ICD-10-CM | POA: Diagnosis not present

## 2021-12-13 DIAGNOSIS — E039 Hypothyroidism, unspecified: Secondary | ICD-10-CM | POA: Diagnosis not present

## 2021-12-13 DIAGNOSIS — F32A Depression, unspecified: Secondary | ICD-10-CM | POA: Diagnosis not present

## 2021-12-21 ENCOUNTER — Other Ambulatory Visit: Payer: Self-pay

## 2021-12-21 DIAGNOSIS — E782 Mixed hyperlipidemia: Secondary | ICD-10-CM

## 2022-01-04 ENCOUNTER — Telehealth: Payer: Self-pay | Admitting: Cardiology

## 2022-01-04 NOTE — Telephone Encounter (Signed)
Wife of the patient called. The patient had labs done at his PCP 12/07/21. The wife wanted to know if the patient still needs to have the lipid panel done 01/23/22. She is not sure that Insurance will cover two lab draws so close together.  Please advise

## 2022-01-04 NOTE — Telephone Encounter (Signed)
Req'd labs from PCP

## 2022-01-04 NOTE — Telephone Encounter (Signed)
Labs recv'd will scan to Provider.

## 2022-01-09 NOTE — Telephone Encounter (Signed)
Spoke to wife who stated that PCP's office did not send [place] all of the lab work. She will bring it by the office for [place] to look at.

## 2022-01-09 NOTE — Telephone Encounter (Signed)
PCP labs sent show only thyroid was checked Jan 25. Sicne different test should not be an issue with insurance as far as getting the cholesterol and liver tests checked  Zandra Abts MD

## 2022-03-12 DIAGNOSIS — I10 Essential (primary) hypertension: Secondary | ICD-10-CM | POA: Diagnosis not present

## 2022-03-12 DIAGNOSIS — E1165 Type 2 diabetes mellitus with hyperglycemia: Secondary | ICD-10-CM | POA: Diagnosis not present

## 2022-03-12 DIAGNOSIS — E782 Mixed hyperlipidemia: Secondary | ICD-10-CM | POA: Diagnosis not present

## 2022-03-12 DIAGNOSIS — E039 Hypothyroidism, unspecified: Secondary | ICD-10-CM | POA: Diagnosis not present

## 2022-03-17 DIAGNOSIS — E1165 Type 2 diabetes mellitus with hyperglycemia: Secondary | ICD-10-CM | POA: Diagnosis not present

## 2022-03-17 DIAGNOSIS — I10 Essential (primary) hypertension: Secondary | ICD-10-CM | POA: Diagnosis not present

## 2022-03-17 DIAGNOSIS — E039 Hypothyroidism, unspecified: Secondary | ICD-10-CM | POA: Diagnosis not present

## 2022-03-30 DIAGNOSIS — E1165 Type 2 diabetes mellitus with hyperglycemia: Secondary | ICD-10-CM | POA: Diagnosis not present

## 2022-03-30 DIAGNOSIS — R008 Other abnormalities of heart beat: Secondary | ICD-10-CM | POA: Diagnosis not present

## 2022-03-30 DIAGNOSIS — I25810 Atherosclerosis of coronary artery bypass graft(s) without angina pectoris: Secondary | ICD-10-CM | POA: Diagnosis not present

## 2022-03-30 DIAGNOSIS — M545 Low back pain, unspecified: Secondary | ICD-10-CM | POA: Diagnosis not present

## 2022-03-30 DIAGNOSIS — E039 Hypothyroidism, unspecified: Secondary | ICD-10-CM | POA: Diagnosis not present

## 2022-03-30 DIAGNOSIS — R296 Repeated falls: Secondary | ICD-10-CM | POA: Diagnosis not present

## 2022-03-30 DIAGNOSIS — R944 Abnormal results of kidney function studies: Secondary | ICD-10-CM | POA: Diagnosis not present

## 2022-03-30 DIAGNOSIS — G3184 Mild cognitive impairment, so stated: Secondary | ICD-10-CM | POA: Diagnosis not present

## 2022-03-30 DIAGNOSIS — F32A Depression, unspecified: Secondary | ICD-10-CM | POA: Diagnosis not present

## 2022-03-30 DIAGNOSIS — E782 Mixed hyperlipidemia: Secondary | ICD-10-CM | POA: Diagnosis not present

## 2022-03-30 DIAGNOSIS — K429 Umbilical hernia without obstruction or gangrene: Secondary | ICD-10-CM | POA: Diagnosis not present

## 2022-03-30 DIAGNOSIS — Z0001 Encounter for general adult medical examination with abnormal findings: Secondary | ICD-10-CM | POA: Diagnosis not present

## 2022-06-20 ENCOUNTER — Ambulatory Visit: Payer: Self-pay

## 2022-06-20 ENCOUNTER — Other Ambulatory Visit: Payer: Self-pay

## 2022-06-20 ENCOUNTER — Ambulatory Visit
Admission: EM | Admit: 2022-06-20 | Discharge: 2022-06-20 | Disposition: A | Discharge status: Home / Self Care | Payer: HMO | Attending: Family Medicine | Admitting: Family Medicine

## 2022-06-20 ENCOUNTER — Encounter: Payer: Self-pay | Admitting: Emergency Medicine

## 2022-06-20 DIAGNOSIS — L03119 Cellulitis of unspecified part of limb: Secondary | ICD-10-CM | POA: Diagnosis not present

## 2022-06-20 DIAGNOSIS — L02419 Cutaneous abscess of limb, unspecified: Secondary | ICD-10-CM

## 2022-06-20 DIAGNOSIS — M5431 Sciatica, right side: Secondary | ICD-10-CM

## 2022-06-20 MED ORDER — CHLORHEXIDINE GLUCONATE 4 % EX LIQD: Freq: Every day | CUTANEOUS | 0 refills | Status: DC | PRN

## 2022-06-20 MED ORDER — DOXYCYCLINE HYCLATE 100 MG PO CAPS
100.0000 mg | ORAL_CAPSULE | Freq: Two times a day (BID) | ORAL | 0 refills | Status: DC
Start: 2022-06-20 — End: 2023-06-21

## 2022-06-20 MED ORDER — CYCLOBENZAPRINE HCL 5 MG PO TABS: 5.0000 mg | ORAL_TABLET | Freq: Three times a day (TID) | ORAL | 0 refills | Status: DC | PRN

## 2022-06-20 MED ORDER — MUPIROCIN 2 % EX OINT: 1.0000 | TOPICAL_OINTMENT | Freq: Two times a day (BID) | CUTANEOUS | 0 refills | Status: DC

## 2022-06-20 NOTE — ED Notes (Signed)
Site cleaned with wound cleanser, bacitracin and telfa applied to site, secured with coban. Pt tolerated well.  Site management and infection prevention education provided.

## 2022-06-20 NOTE — ED Triage Notes (Signed)
Pt unsure if insect bite or "boil" but reports continued redness, swelling, drainage to RLE. Pt reports has been putting ointment on it "to draw out core."

## 2022-06-20 NOTE — ED Provider Notes (Signed)
[facility]    CSN: [id number] Arrival date & time: 06/20/22  1315      History   Chief Complaint Chief Complaint  Patient presents with   Wound Check    HPI Hunter Sparks is a 76 y.o. male.   Presenting today with a boil to the right lower leg anteriorly that he states has been present for about a week, worsening over time.  He states has been draining some blood and yellow discharge for the last day or so.  Has been using numerous salves and ointments to try and draw out the infection with no relief.  Denies fever, chills, numbness, tingling.  No known injury to the area, unsure if he had a bug bite or what was the initial cause.  He also reports issues with right sciatica flare.  States this happens occasionally, has flared up the last few days.  He is on chronic pain medication but states this has not been helpful.  He is not able to take over-the-counter pain relievers.  Denies bowel or bladder incontinence, saddle anesthesias, leg weakness numbness or tingling.    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[place])    Arthritis    "back, hips, legs" (06/10/2015)   Chest pain    Chronic lower back pain    Depression    DJD (degenerative joint disease)    Hyperlipidemia 06/11/2015   Hypertension    Hypothyroidism    Kidney stone    Kidney stone    Type II diabetes mellitus [facility])     Patient Active Problem List   Diagnosis Date Noted   Hx of colonic polyps 06/13/2017   Family hx of colon cancer 06/13/2017   Vitamin D insufficiency 01/04/2017   Uncontrolled type 2 diabetes mellitus with complication, without long-term current use of insulin 09/01/2015   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   Mixed hyperlipidemia 06/11/2015   Essential hypertension    Coronary artery disease involving native coronary artery of native heart with unstable angina pectoris (HCC)    Angina, 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[place]) 06/10/2015   DM type 2 causing vascular disease ([place]) 06/10/2015    Hypothyroidism 06/10/2015   History of tobacco use 06/10/2015   Chronic low back pain 06/10/2015    Past Surgical History:  Procedure Laterality Date   CARDIAC CATHETERIZATION N/A 06/11/2015   Procedure: Left Heart Cath and Coronary Angiography;  Surgeon: Belva Crome, MD;  Location: [place] [facility];  Service: Cardiovascular;  Laterality: N/A;   COLONOSCOPY  06/21/2011   Procedure: COLONOSCOPY;  Surgeon: Rogene Houston, MD;  [facility];  Service: Endoscopy;  Laterality: N/A;   COLONOSCOPY     COLONOSCOPY N/A 09/20/2017   Procedure: COLONOSCOPY;  Surgeon: Rogene Houston, MD;  [facility];  Service: Endoscopy;  Laterality: N/A;  1200   CORONARY ARTERY BYPASS GRAFT N/A 06/14/2015   Procedure: CORONARY ARTERY BYPASS GRAFTING times four using Left Internal mammary artery and right leg Saphenous vein graft;  Surgeon: Ivin Poot, MD;  Location: [place];  Service: Open Heart Surgery;  Laterality: N/A;   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 Authorizing Provider  chlorhexidine (HIBICLENS) 4 % external liquid Apply topically daily as needed. 06/20/22  Yes Volney American, PA-C  cyclobenzaprine (FLEXERIL) 5 MG tablet Take 1 tablet (5 mg total) by mouth 3 (three) times daily as needed for muscle spasms. Do not drink alcohol or drive while taking this medication.  May cause drowsiness. 06/20/22  Yes Volney American, PA-C  doxycycline (VIBRAMYCIN) 100 MG capsule Take 1 capsule (100 mg total) by mouth 2 (two) times daily. 06/20/22  Yes Volney American, PA-C  mupirocin ointment (BACTROBAN) 2 % Apply 1  Application topically 2 (two) times daily. 06/20/22  Yes Volney American, PA-C  atorvastatin (LIPITOR) 40 MG tablet atorvastatin 40 mg tablet    [provider]  blood glucose meter kit and supplies 1 each by Other route 4 (four) times daily. Dispense based on patient and insurance preference. Use up to four times daily as directed. (FOR ICD-10 E10.9, E11.9). 01/16/20   Cassandria Anger, MD  Evolocumab (REPATHA SURECLICK) 272 MG/ML SOAJ Inject 140 mg into the skin every 14 (fourteen) days. 04/04/21   Theora Gianotti, NP  glipiZIDE (GLUCOTROL XL) 5 MG 24 hr tablet Take 1 tablet (5 mg total) by mouth daily with breakfast. 01/21/20   Nida, Marella Chimes, MD  glucose blood (ONETOUCH VERIO) test strip 1 each by Other route 4 (four) times daily. Use as instructed 10/27/19   Cassandria Anger, MD  Insulin Glargine, 1 Unit Dial, 300 UNIT/ML SOPN Inject 54 Units into the skin at bedtime. 11/12/19   Cassandria Anger, MD  Insulin Pen Needle (B-D ULTRAFINE III SHORT PEN) 31G X 8 MM MISC 1 each by Does not apply route as directed. 10/27/19   Cassandria Anger, MD  levothyroxine (SYNTHROID) 75 MCG tablet TAKE 1 TABLET BY MOUTH ONCE DAILY BEFORE BREAKFAST 10/16/19   Cassandria Anger, MD  lidocaine (LIDODERM) 5 % Place 1 patch onto the skin daily. Remove & Discard patch within 12 hours or as directed by MD 12/03/21   Triplett, Tammy, PA-C  losartan (COZAAR) 25 MG tablet Take 1 tablet (25 mg total) by mouth daily. 03/18/21 06/16/21  Theora Gianotti, NP  metoprolol tartrate (LOPRESSOR) 25 MG tablet Take 1 tablet (25 mg total) by mouth 2 (two) times daily. 03/18/21   Theora Gianotti, NP  traMADol (ULTRAM) 50 MG tablet Take 1 tablet (50 mg total) by mouth every 6 (six) hours as needed. pain 06/19/15   Barrett, Lodema Hong, PA-C    Family History Family History  Problem Relation Age of Onset   Stroke Mother    Hypertension Mother    Stomach cancer Father    Liver  cancer Father    Cancer Father    Rectal cancer Brother    Cancer Brother    Heart attack Neg Hx     Social History Social History   Tobacco Use   Smoking status: Former    Packs/day: 2.00    Years: 45.00    Total pack years: 90.00    Types: Cigarettes    Start date: 61    Quit date: 2005    Years since quitting: 18.6   Smokeless tobacco: Current    Types: Chew  Vaping Use   Vaping Use: Never used  Substance Use Topics   Alcohol use: No    Alcohol/week: 0.0 standard drinks of alcohol  Drug use: No     Allergies   Penicillins   Review of Systems Review of Systems Per HPI  Physical Exam Triage Vital Signs ED Triage Vitals  Enc Vitals Group     BP 06/20/22 1354 125/76     Pulse Rate 06/20/22 1354 63     Resp 06/20/22 1354 14     Temp 06/20/22 1354 98.1 F (36.7 C)     Temp Source 06/20/22 1354 Oral     SpO2 06/20/22 1354 97 %     Weight --      Height --      Head Circumference --      Peak Flow --      Pain Score 06/20/22 1403 0     Pain Loc --      Pain Edu? --      Excl. in National? --    No data found.  Updated Vital Signs BP 125/76 (BP Location: Right Arm)   Pulse 63   Temp 98.1 F (36.7 C) (Oral)   Resp 14   SpO2 97%   Visual Acuity Right Eye Distance:   Left Eye Distance:   Bilateral Distance:    Right Eye Near:   Left Eye Near:    Bilateral Near:     Physical Exam Vitals and nursing note reviewed.  Constitutional:      Appearance: Normal appearance.  HENT:     Head: Atraumatic.  Eyes:     Extraocular Movements: Extraocular movements intact.     Conjunctiva/sclera: Conjunctivae normal.  Cardiovascular:     Rate and Rhythm: Normal rate and regular rhythm.  Pulmonary:     Effort: Pulmonary effort is normal.     Breath sounds: Normal breath sounds.  Musculoskeletal:        General: Normal range of motion.     Cervical back: Normal range of motion and neck supple.     Comments: No midline spinal tenderness to palpation  diffusely.  Negative straight leg raise bilateral lower extremities.  Normal gait and range of motion.  Skin:    General: Skin is warm.     Comments: Large area of erythema, fluctuance, warmth to anterior right lower leg slightly laterally.  Open area in the center, some serosanguineous drainage currently  Neurological:     General: No focal deficit present.     Mental Status: He is oriented to person, place, and time.     Comments: Right lower extremity neurovascularly intact  Psychiatric:        Mood and Affect: Mood normal.        Thought Content: Thought content normal.        Judgment: Judgment normal.      UC Treatments / Results  Labs (all labs ordered are listed, but only abnormal results are displayed) Labs Reviewed - No data to display  EKG   Radiology No results found.  Procedures Procedures (including critical care time)  Medications Ordered in UC Medications - No data to display  Initial Impression / Assessment and Plan / UC Course  I have reviewed the triage vital signs and the nursing notes.  Pertinent labs & imaging results that were available during my care of the patient were reviewed by me and considered in my medical decision making (see chart for details).     Wound cleaned and dressing applied in clinic, treat infection with doxycycline, Hibiclens, mupirocin ointment and good home wound care.  Elevate leg at rest, ice off-and-on  for swelling.  Stretches, massage, Epsom salt baths, Flexeril for sciatica.  Return for any worsening symptoms.   Final Clinical Impressions(s) / UC Diagnoses   Final diagnoses:  Cellulitis and abscess of leg  Right sided sciatica   Discharge Instructions   None    ED Prescriptions     Medication Sig Dispense Auth. Provider   doxycycline (VIBRAMYCIN) 100 MG capsule Take 1 capsule (100 mg total) by mouth 2 (two) times daily. 20 capsule Volney American, [place]   chlorhexidine (HIBICLENS) 4 % external liquid  Apply topically daily as needed. 120 mL Volney American, PA-C   mupirocin ointment (BACTROBAN) 2 % Apply 1 Application topically 2 (two) times daily. 22 g Volney American, [place]   cyclobenzaprine (FLEXERIL) 5 MG tablet Take 1 tablet (5 mg total) by mouth 3 (three) times daily as needed for muscle spasms. Do not drink alcohol or drive while taking this medication.  May cause drowsiness. 15 tablet Volney American, [place]      PDMP not reviewed this encounter.   Volney American, [place] 06/20/22 1426

## 2022-06-28 ENCOUNTER — Ambulatory Visit: Payer: Self-pay

## 2022-06-28 ENCOUNTER — Ambulatory Visit
Admission: EM | Admit: 2022-06-28 | Discharge: 2022-06-28 | Disposition: A | Discharge status: Home / Self Care | Payer: HMO | Attending: Family Medicine | Admitting: Family Medicine

## 2022-06-28 DIAGNOSIS — L720 Epidermal cyst: Secondary | ICD-10-CM | POA: Diagnosis not present

## 2022-06-28 DIAGNOSIS — L089 Local infection of the skin and subcutaneous tissue, unspecified: Secondary | ICD-10-CM | POA: Diagnosis not present

## 2022-06-28 DIAGNOSIS — I10 Essential (primary) hypertension: Secondary | ICD-10-CM

## 2022-06-28 NOTE — ED Provider Notes (Signed)
[place]   [id number] 06/28/22 Arrival Time: 9628  ASSESSMENT & PLAN:  1. Infected epidermoid cyst   2. Elevated blood pressure reading in office with diagnosis of hypertension     Incision and Drainage Procedure Note  Anesthesia: 2% plain lidocaine  Procedure Details  The procedure, risks and complications have been discussed in detail (including, but not limited to pain and bleeding) with the patient.  The skin induration was prepped and draped in the usual fashion. After adequate local anesthesia, I&D with a #11 blade was performed on the right anterior lower leg with copious, purulent, cystic  drainage.  EBL: minimal Drains: none Packing: n/a Condition: Tolerated procedure well Complications: none.    Discharge Instructions      Finish the antibiotic previously prescribed.  Your blood pressure was noted to be elevated during your visit today. If you are currently taking medication for high blood pressure, please ensure you are taking this as directed. If you do not have a history of high blood pressure and your blood pressure remains persistently elevated, you may need to begin taking a medication at some point. You may return here within the next few days to recheck if unable to see your primary care provider or if you do not have a one.  BP (!) 188/97 (BP Location: Left Arm)   Pulse 80   Temp 98.2 F (36.8 C) (Oral)   Resp 18   SpO2 97%   BP Readings from Last 3 Encounters:  06/28/22 (!) 188/97  06/20/22 125/76  12/03/21 (!) 195/93       Wound care instructions discussed and given in written format. To return in 48 hours for wound check if needed.  Finish all antibiotics. OTC analgesics as needed.   Follow-up Information     Celene Squibb, MD.   Specialty: Internal Medicine Why: If worsening or failing to improve as anticipated. Contact information: [facility] [address] [phone]                  Reviewed expectations re: course of current medical issues. Questions answered. Outlined signs and symptoms indicating need for more acute intervention. Patient verbalized understanding. After Visit Summary given.   SUBJECTIVE:  Hunter Sparks is a 76 y.o. male seen here on 06/20/22 who presents with concerns that "it's not healing". Taking medications prescribed. Occasional "thick drainage and bleeding". Minimal pain. Afebrile.  Increased blood pressure noted today. Reports that he is treated for HTN. He reports taking medications as instructed, no chest pain on exertion, no dyspnea on exertion, no swelling of ankles, no orthostatic dizziness or lightheadedness, no orthopnea or paroxysmal nocturnal dyspnea, and no palpitations.  OBJECTIVE:  Vitals:   06/28/22 0929  BP: (!) 188/97  Pulse: 80  Resp: 18  Temp: 98.2 F (36.8 C)  TempSrc: Oral  SpO2: 97%     General appearance: alert; no distress CV: reg RLE: approx 2.5 cm induration of his anterior lower leg; tender to touch; with mild active drainage or bleeding; is TTP Psychological: alert and cooperative; normal mood and affect  Allergies  Allergen Reactions   Penicillins Rash    Has patient had a PCN reaction causing immediate rash, facial/tongue/throat swelling, SOB or lightheadedness with hypotension: Yes Has patient had a PCN reaction causing severe rash involving mucus membranes or skin necrosis: No Has patient had a PCN reaction that required hospitalization: No Has patient had a PCN reaction occurring within the last 10 years:  No If all of the above answers are "NO", then may proceed with Cephalosporin use.     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[place])    Arthritis    "back, hips, legs" (06/10/2015)   Chest pain    Chronic lower back pain    Depression    DJD (degenerative joint disease)    Hyperlipidemia 06/11/2015   Hypertension    Hypothyroidism    Kidney stone    Kidney stone     Type II diabetes mellitus ([place])    Social History   Socioeconomic History   Marital status: Married    Spouse name: Not on file   Number of children: Not on file   Years of education: Not on file   Highest education level: Not on file  Occupational History   Not on file  Tobacco Use   Smoking status: Former    Packs/day: 2.00    Years: 45.00    Total pack years: 90.00    Types: Cigarettes    Start date: 42    Quit date: 2005    Years since quitting: 18.6   Smokeless tobacco: Current    Types: Chew  Vaping Use   Vaping Use: Never used  Substance and Sexual Activity   Alcohol use: No    Alcohol/week: 0.0 standard drinks of alcohol   Drug use: No   Sexual activity: Never    Partners: Female  Other Topics Concern   Not on file  Social History Narrative   Not on file   Social Determinants of Health   Financial Resource Strain: Not on file  Food Insecurity: Not on file  Transportation Needs: Not on file  Physical Activity: Not on file  Stress: Not on file  Social Connections: Not on file   Family History  Problem Relation Age of Onset   Stroke Mother    Hypertension Mother    Stomach cancer Father    Liver cancer Father    Cancer Father    Rectal cancer Brother    Cancer Brother    Heart attack Neg Hx    Past Surgical History:  Procedure Laterality Date   CARDIAC CATHETERIZATION N/A 06/11/2015   Procedure: Left Heart Cath and Coronary Angiography;  Surgeon: Belva Crome, MD;  Location: [place] [facility];  Service: Cardiovascular;  Laterality: N/A;   COLONOSCOPY  06/21/2011   Procedure: COLONOSCOPY;  Surgeon: Rogene Houston, MD;  [facility];  Service: Endoscopy;  Laterality: N/A;   COLONOSCOPY     COLONOSCOPY N/A 09/20/2017   Procedure: COLONOSCOPY;  Surgeon: Rogene Houston, MD;  [facility];  Service: Endoscopy;  Laterality: N/A;  1200   CORONARY ARTERY BYPASS GRAFT N/A 06/14/2015   Procedure: CORONARY ARTERY BYPASS GRAFTING  times four using Left Internal mammary artery and right leg Saphenous vein graft;  Surgeon: Ivin Poot, MD;  Location: [place];  Service: Open Heart Surgery;  Laterality: N/A;   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

## 2022-06-28 NOTE — Discharge Instructions (Addendum)
Finish the antibiotic previously prescribed.  Your blood pressure was noted to be elevated during your visit today. If you are currently taking medication for high blood pressure, please ensure you are taking this as directed. If you do not have a history of high blood pressure and your blood pressure remains persistently elevated, you may need to begin taking a medication at some point. You may return here within the next few days to recheck if unable to see your primary care provider or if you do not have a one.  BP (!) 188/97 (BP Location: Left Arm)   Pulse 80   Temp 98.2 F (36.8 C) (Oral)   Resp 18   SpO2 97%   BP Readings from Last 3 Encounters:  06/28/22 (!) 188/97  06/20/22 125/76  12/03/21 (!) 195/93

## 2022-06-28 NOTE — ED Triage Notes (Signed)
Pt present right leg wound, from a previous insect bite. Pt state the wound is not getting any better and the medication he was prescribed is not helping.

## 2022-07-05 DIAGNOSIS — E039 Hypothyroidism, unspecified: Secondary | ICD-10-CM | POA: Diagnosis not present

## 2022-07-05 DIAGNOSIS — I10 Essential (primary) hypertension: Secondary | ICD-10-CM | POA: Diagnosis not present

## 2022-07-05 DIAGNOSIS — E1165 Type 2 diabetes mellitus with hyperglycemia: Secondary | ICD-10-CM | POA: Diagnosis not present

## 2022-07-12 DIAGNOSIS — K429 Umbilical hernia without obstruction or gangrene: Secondary | ICD-10-CM | POA: Diagnosis not present

## 2022-07-12 DIAGNOSIS — M545 Low back pain, unspecified: Secondary | ICD-10-CM | POA: Diagnosis not present

## 2022-07-12 DIAGNOSIS — E782 Mixed hyperlipidemia: Secondary | ICD-10-CM | POA: Diagnosis not present

## 2022-07-12 DIAGNOSIS — N1831 Chronic kidney disease, stage 3a: Secondary | ICD-10-CM | POA: Diagnosis not present

## 2022-07-12 DIAGNOSIS — E1165 Type 2 diabetes mellitus with hyperglycemia: Secondary | ICD-10-CM | POA: Diagnosis not present

## 2022-07-12 DIAGNOSIS — R296 Repeated falls: Secondary | ICD-10-CM | POA: Diagnosis not present

## 2022-07-12 DIAGNOSIS — E039 Hypothyroidism, unspecified: Secondary | ICD-10-CM | POA: Diagnosis not present

## 2022-07-12 DIAGNOSIS — F32A Depression, unspecified: Secondary | ICD-10-CM | POA: Diagnosis not present

## 2022-07-12 DIAGNOSIS — G3184 Mild cognitive impairment, so stated: Secondary | ICD-10-CM | POA: Diagnosis not present

## 2022-07-12 DIAGNOSIS — T466X5A Adverse effect of antihyperlipidemic and antiarteriosclerotic drugs, initial encounter: Secondary | ICD-10-CM | POA: Diagnosis not present

## 2022-07-12 DIAGNOSIS — I25810 Atherosclerosis of coronary artery bypass graft(s) without angina pectoris: Secondary | ICD-10-CM | POA: Diagnosis not present

## 2022-07-12 DIAGNOSIS — R2689 Other abnormalities of gait and mobility: Secondary | ICD-10-CM | POA: Diagnosis not present

## 2022-09-06 ENCOUNTER — Encounter (INDEPENDENT_AMBULATORY_CARE_PROVIDER_SITE_OTHER): Payer: Self-pay | Admitting: *Deleted

## 2022-12-10 ENCOUNTER — Other Ambulatory Visit: Payer: Self-pay | Admitting: Nurse Practitioner

## 2023-02-25 ENCOUNTER — Other Ambulatory Visit: Payer: Self-pay | Admitting: Nurse Practitioner

## 2023-03-14 DIAGNOSIS — E782 Mixed hyperlipidemia: Secondary | ICD-10-CM | POA: Diagnosis not present

## 2023-03-14 DIAGNOSIS — E039 Hypothyroidism, unspecified: Secondary | ICD-10-CM | POA: Diagnosis not present

## 2023-03-14 DIAGNOSIS — I10 Essential (primary) hypertension: Secondary | ICD-10-CM | POA: Diagnosis not present

## 2023-03-20 DIAGNOSIS — E782 Mixed hyperlipidemia: Secondary | ICD-10-CM | POA: Diagnosis not present

## 2023-03-20 DIAGNOSIS — E1165 Type 2 diabetes mellitus with hyperglycemia: Secondary | ICD-10-CM | POA: Diagnosis not present

## 2023-03-20 DIAGNOSIS — E039 Hypothyroidism, unspecified: Secondary | ICD-10-CM | POA: Diagnosis not present

## 2023-03-20 DIAGNOSIS — G8929 Other chronic pain: Secondary | ICD-10-CM | POA: Diagnosis not present

## 2023-03-20 DIAGNOSIS — K429 Umbilical hernia without obstruction or gangrene: Secondary | ICD-10-CM | POA: Diagnosis not present

## 2023-03-20 DIAGNOSIS — R296 Repeated falls: Secondary | ICD-10-CM | POA: Diagnosis not present

## 2023-03-20 DIAGNOSIS — R2689 Other abnormalities of gait and mobility: Secondary | ICD-10-CM | POA: Diagnosis not present

## 2023-03-20 DIAGNOSIS — G3184 Mild cognitive impairment, so stated: Secondary | ICD-10-CM | POA: Diagnosis not present

## 2023-03-20 DIAGNOSIS — I25810 Atherosclerosis of coronary artery bypass graft(s) without angina pectoris: Secondary | ICD-10-CM | POA: Diagnosis not present

## 2023-03-20 DIAGNOSIS — F32A Depression, unspecified: Secondary | ICD-10-CM | POA: Diagnosis not present

## 2023-03-20 DIAGNOSIS — M545 Low back pain, unspecified: Secondary | ICD-10-CM | POA: Diagnosis not present

## 2023-03-20 DIAGNOSIS — N1831 Chronic kidney disease, stage 3a: Secondary | ICD-10-CM | POA: Diagnosis not present

## 2023-04-04 DIAGNOSIS — M5416 Radiculopathy, lumbar region: Secondary | ICD-10-CM | POA: Diagnosis not present

## 2023-04-05 DIAGNOSIS — M5416 Radiculopathy, lumbar region: Secondary | ICD-10-CM | POA: Diagnosis not present

## 2023-05-01 DIAGNOSIS — M5416 Radiculopathy, lumbar region: Secondary | ICD-10-CM | POA: Diagnosis not present

## 2023-05-01 DIAGNOSIS — Z6826 Body mass index (BMI) 26.0-26.9, adult: Secondary | ICD-10-CM | POA: Diagnosis not present

## 2023-05-28 ENCOUNTER — Other Ambulatory Visit: Payer: Self-pay | Admitting: Nurse Practitioner

## 2023-05-28 IMAGING — CT CT L SPINE W/O CM
3 series · 10 of 33 positions shown, 11 images · non-contrast
Comparison: CT abdomen pelvis 12/03/2021. MRI lumbar spine
10/08/2019

CLINICAL DATA: Flank pain



[Series 3: coronal bone · coronal · 0.39mm/px · 3 of 116 slices shown]
[im 24/116  bone]
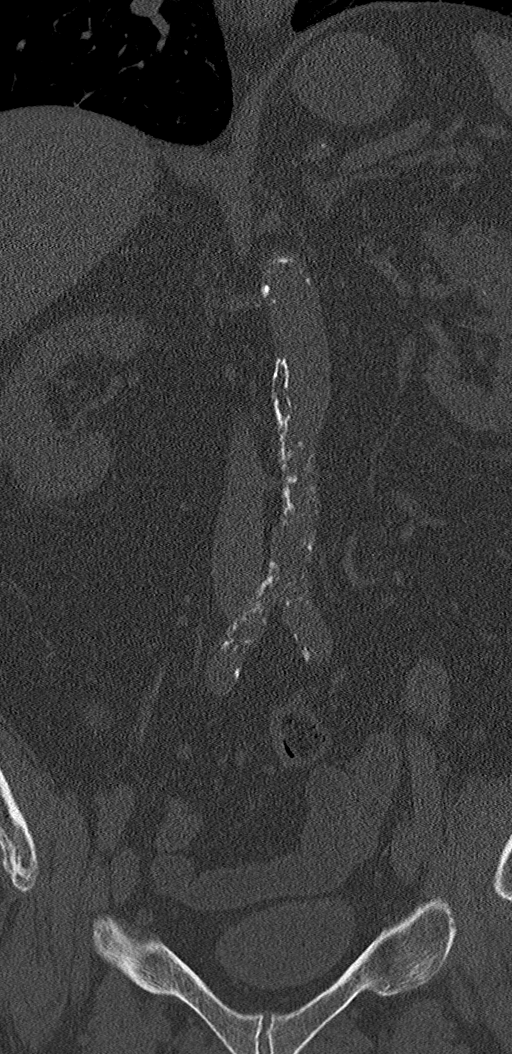
[im 47/116  bone]
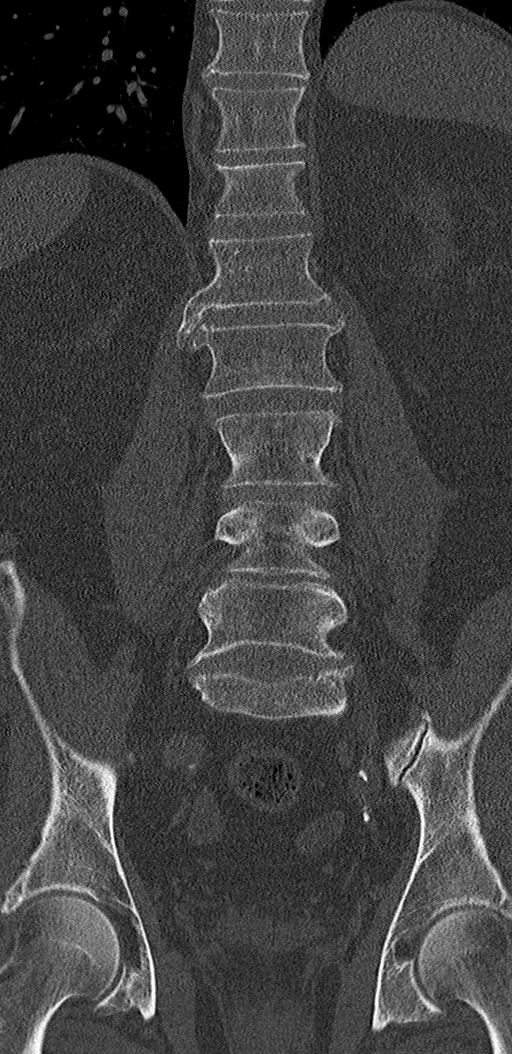
[im 70/116  bone]
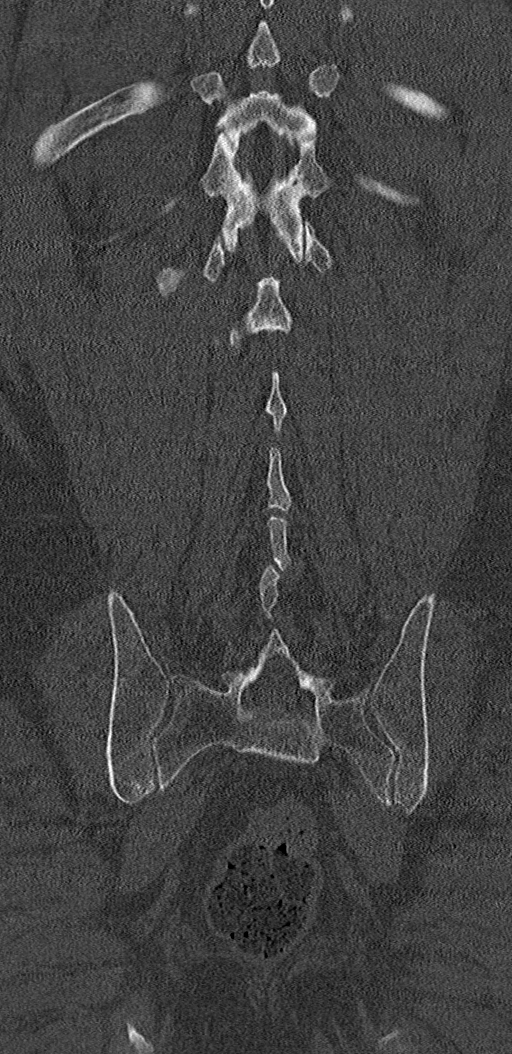

[Series 4: sagittal bone · sagittal · 0.39mm/px · 5 of 98 slices shown]
[im 33/98  bone]
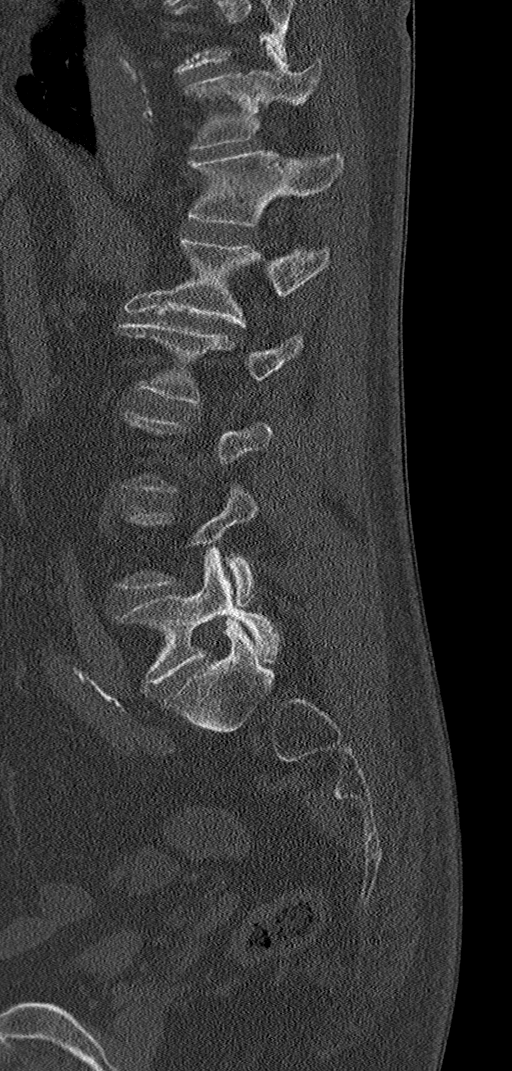
[im 41/98  bone]
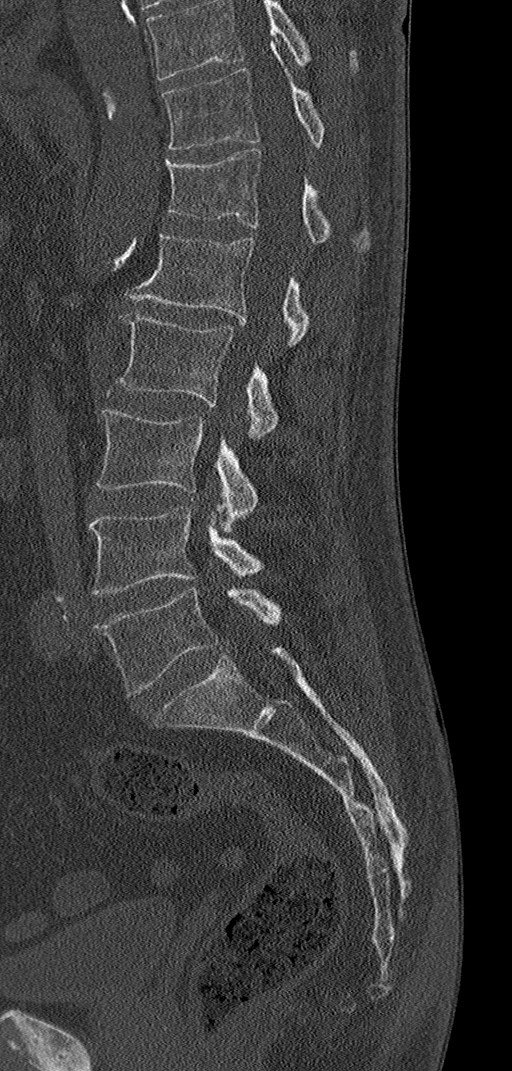
[im 49/98  bone]
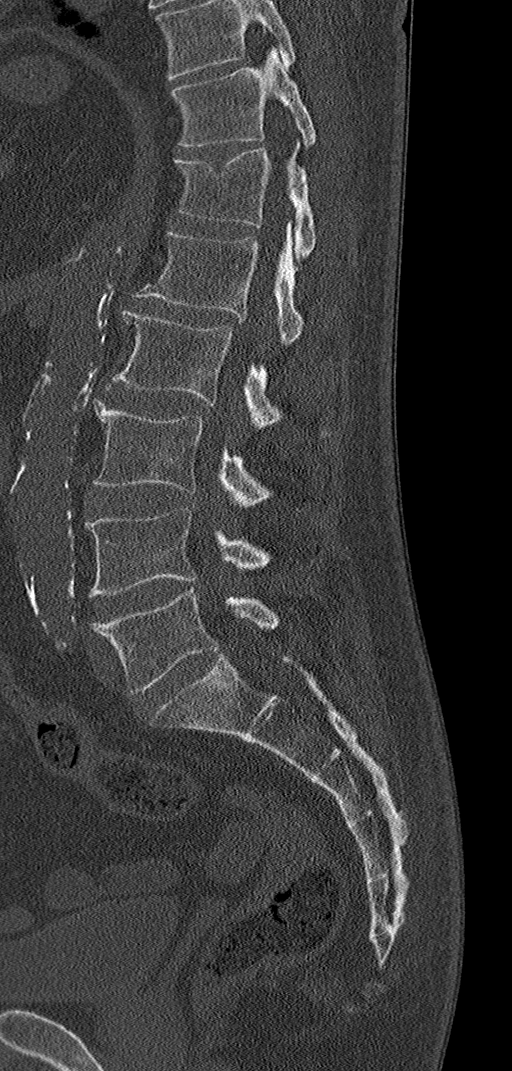
[im 57/98  bone]
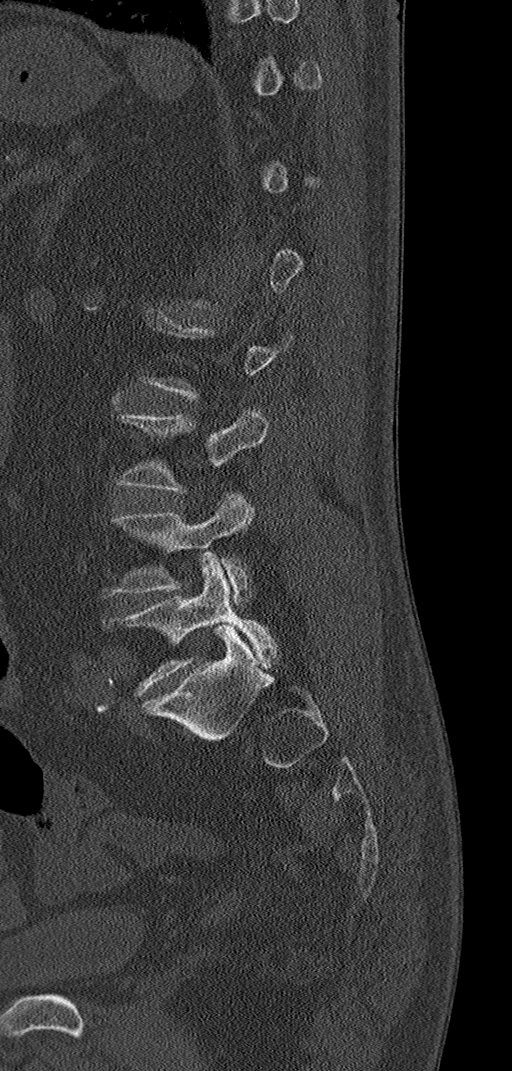
[im 65/98  bone]
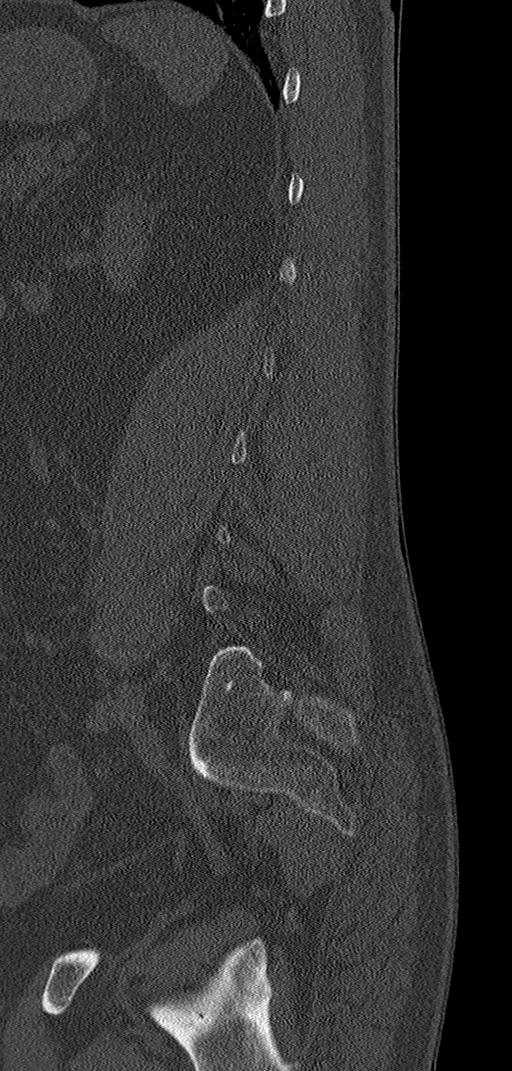

[Series 6: axial st · axial · 0.43mm/px · z∈[+1130,+1288]mm · 2 of 172 slices shown, 3 images]
[im 53/172  soft-tissue]
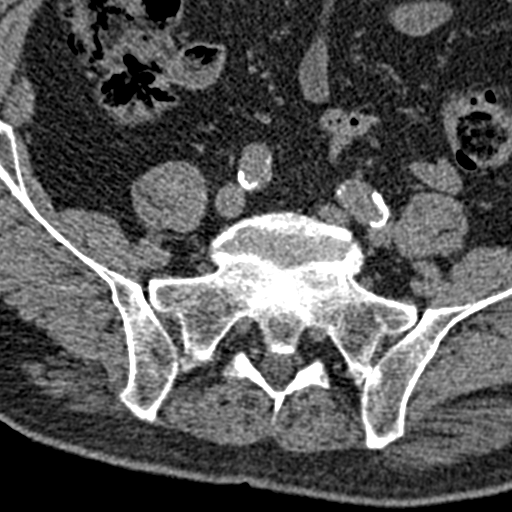
[im 53/172  bone]
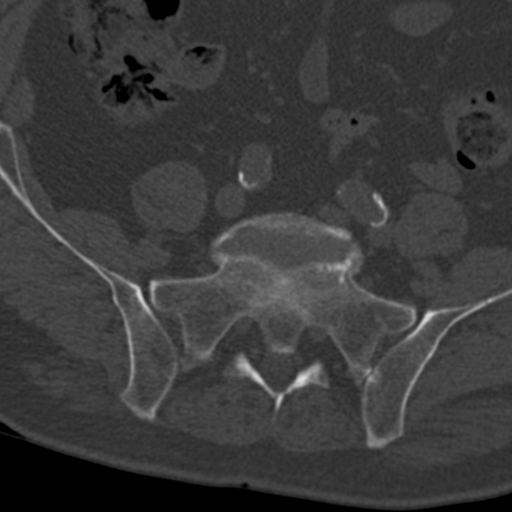
[im 132/172  bone]
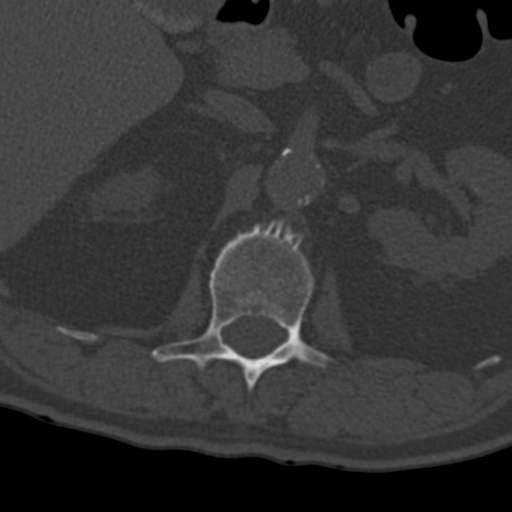

[10 of 33 positions shown; findings below may reference images not displayed]

FINDINGS: Segmentation: 5 lumbar vertebra.

Alignment: Mild retrolisthesis L1-2 and L2-3

Vertebrae: Negative for fracture or mass

Paraspinal and other soft tissues: CT abdomen pelvis reported
separately from today.

Disc levels: T12-L1: Negative for stenosis

L1-2: Disc degeneration with endplate spurring and bilateral facet
degeneration. Mild foraminal narrowing bilaterally.

L2-3: Disc bulging and facet degeneration.  Negative for stenosis

L3-4: Disc bulging and mild facet degeneration. Mild spinal
stenosis. Mild subarticular stenosis bilaterally

L4-5: Disc bulging and moderate facet degeneration. Mild
subarticular stenosis bilaterally

L5-S1: Bilateral facet degeneration. Mild subarticular stenosis
bilaterally.
IMPRESSION: 1. No acute abnormality.  Negative for lumbar fracture
2. Mild lumbar degenerative change with mild stenosis as above.

## 2023-06-06 ENCOUNTER — Telehealth: Payer: Self-pay | Admitting: Cardiology

## 2023-06-06 NOTE — Telephone Encounter (Signed)
Wife is calling to see if the patient can have a virtual visit to be able to get refill. States the patient can barely walk because he has hurt his back. Please advise

## 2023-06-08 NOTE — Telephone Encounter (Signed)
Pt scheduled for 07/19/23 with C.Fransico Michael

## 2023-06-19 ENCOUNTER — Telehealth: Payer: Self-pay | Admitting: Cardiology

## 2023-06-19 MED ORDER — LOSARTAN POTASSIUM 25 MG PO TABS: 25.0000 mg | ORAL_TABLET | Freq: Every day | ORAL | 0 refills | Status: DC

## 2023-06-19 MED ORDER — METOPROLOL TARTRATE 25 MG PO TABS: 25.0000 mg | ORAL_TABLET | Freq: Two times a day (BID) | ORAL | 0 refills | Status: DC

## 2023-06-19 NOTE — Telephone Encounter (Signed)
Refilled 90 day as requested

## 2023-06-19 NOTE — Telephone Encounter (Signed)
*  STAT* If patient is at the pharmacy, call can be transferred to refill team.   1. Which medications need to be refilled? (please list name of each medication and dose if known)   losartan (COZAAR) 25 MG tablet  metoprolol tartrate (LOPRESSOR) 25 MG tablet    2. Would you like to learn more about the convenience, safety, & potential cost savings by using the [facility]? No   3. Are you open to using the [facility] (Type [facility].)No   4. Which pharmacy/location (including street and city if local pharmacy) is medication to be sent to?[id number] - [place], [place] - [address]    5. Do they need a 30 day or 90 day supply? 90 day  Pt has scheduled appt on 9/5

## 2023-06-20 DIAGNOSIS — R4781 Slurred speech: Secondary | ICD-10-CM | POA: Diagnosis not present

## 2023-06-20 DIAGNOSIS — R0689 Other abnormalities of breathing: Secondary | ICD-10-CM | POA: Diagnosis not present

## 2023-06-20 DIAGNOSIS — I4510 Unspecified right bundle-branch block: Secondary | ICD-10-CM | POA: Diagnosis not present

## 2023-06-20 DIAGNOSIS — R410 Disorientation, unspecified: Secondary | ICD-10-CM | POA: Diagnosis not present

## 2023-06-20 DIAGNOSIS — I10 Essential (primary) hypertension: Secondary | ICD-10-CM | POA: Diagnosis not present

## 2023-06-21 ENCOUNTER — Emergency Department (HOSPITAL_COMMUNITY): Payer: HMO

## 2023-06-21 ENCOUNTER — Encounter (HOSPITAL_COMMUNITY): Payer: Self-pay

## 2023-06-21 ENCOUNTER — Observation Stay (HOSPITAL_COMMUNITY): Payer: HMO

## 2023-06-21 ENCOUNTER — Inpatient Hospital Stay (HOSPITAL_COMMUNITY)
Admission: EM | Admit: 2023-06-21 | Discharge: 2023-06-23 | DRG: 065 | Disposition: A | Discharge status: Rehabilitation Facility | Payer: HMO | Attending: Family Medicine | Admitting: Family Medicine

## 2023-06-21 ENCOUNTER — Other Ambulatory Visit: Payer: Self-pay

## 2023-06-21 DIAGNOSIS — Z823 Family history of stroke: Secondary | ICD-10-CM

## 2023-06-21 DIAGNOSIS — E1159 Type 2 diabetes mellitus with other circulatory complications: Secondary | ICD-10-CM

## 2023-06-21 DIAGNOSIS — Z955 Presence of coronary angioplasty implant and graft: Secondary | ICD-10-CM

## 2023-06-21 DIAGNOSIS — E1122 Type 2 diabetes mellitus with diabetic chronic kidney disease: Secondary | ICD-10-CM | POA: Diagnosis present

## 2023-06-21 DIAGNOSIS — N1831 Chronic kidney disease, stage 3a: Secondary | ICD-10-CM | POA: Diagnosis not present

## 2023-06-21 DIAGNOSIS — E1165 Type 2 diabetes mellitus with hyperglycemia: Secondary | ICD-10-CM | POA: Diagnosis present

## 2023-06-21 DIAGNOSIS — Z794 Long term (current) use of insulin: Secondary | ICD-10-CM | POA: Diagnosis not present

## 2023-06-21 DIAGNOSIS — I2510 Atherosclerotic heart disease of native coronary artery without angina pectoris: Secondary | ICD-10-CM | POA: Diagnosis not present

## 2023-06-21 DIAGNOSIS — I6381 Other cerebral infarction due to occlusion or stenosis of small artery: Secondary | ICD-10-CM | POA: Diagnosis not present

## 2023-06-21 DIAGNOSIS — Z8249 Family history of ischemic heart disease and other diseases of the circulatory system: Secondary | ICD-10-CM

## 2023-06-21 DIAGNOSIS — E119 Type 2 diabetes mellitus without complications: Secondary | ICD-10-CM

## 2023-06-21 DIAGNOSIS — I639 Cerebral infarction, unspecified: Principal | ICD-10-CM

## 2023-06-21 DIAGNOSIS — I6523 Occlusion and stenosis of bilateral carotid arteries: Secondary | ICD-10-CM | POA: Diagnosis not present

## 2023-06-21 DIAGNOSIS — I6389 Other cerebral infarction: Principal | ICD-10-CM | POA: Diagnosis present

## 2023-06-21 DIAGNOSIS — R2990 Unspecified symptoms and signs involving the nervous system: Secondary | ICD-10-CM

## 2023-06-21 DIAGNOSIS — E039 Hypothyroidism, unspecified: Secondary | ICD-10-CM | POA: Diagnosis present

## 2023-06-21 DIAGNOSIS — R29702 NIHSS score 2: Secondary | ICD-10-CM | POA: Diagnosis present

## 2023-06-21 DIAGNOSIS — G8194 Hemiplegia, unspecified affecting left nondominant side: Secondary | ICD-10-CM | POA: Diagnosis present

## 2023-06-21 DIAGNOSIS — I129 Hypertensive chronic kidney disease with stage 1 through stage 4 chronic kidney disease, or unspecified chronic kidney disease: Secondary | ICD-10-CM | POA: Diagnosis present

## 2023-06-21 DIAGNOSIS — R531 Weakness: Secondary | ICD-10-CM

## 2023-06-21 DIAGNOSIS — E785 Hyperlipidemia, unspecified: Secondary | ICD-10-CM | POA: Diagnosis present

## 2023-06-21 DIAGNOSIS — R471 Dysarthria and anarthria: Secondary | ICD-10-CM | POA: Diagnosis present

## 2023-06-21 DIAGNOSIS — R29818 Other symptoms and signs involving the nervous system: Secondary | ICD-10-CM | POA: Diagnosis not present

## 2023-06-21 DIAGNOSIS — Z800 Family history of malignant neoplasm of digestive organs: Secondary | ICD-10-CM

## 2023-06-21 DIAGNOSIS — Z8673 Personal history of transient ischemic attack (TIA), and cerebral infarction without residual deficits: Secondary | ICD-10-CM

## 2023-06-21 DIAGNOSIS — I672 Cerebral atherosclerosis: Secondary | ICD-10-CM | POA: Diagnosis not present

## 2023-06-21 DIAGNOSIS — Z87442 Personal history of urinary calculi: Secondary | ICD-10-CM

## 2023-06-21 DIAGNOSIS — G8929 Other chronic pain: Secondary | ICD-10-CM | POA: Diagnosis present

## 2023-06-21 DIAGNOSIS — Z79899 Other long term (current) drug therapy: Secondary | ICD-10-CM

## 2023-06-21 DIAGNOSIS — Z880 Allergy status to penicillin: Secondary | ICD-10-CM

## 2023-06-21 DIAGNOSIS — Z85828 Personal history of other malignant neoplasm of skin: Secondary | ICD-10-CM

## 2023-06-21 DIAGNOSIS — I25110 Atherosclerotic heart disease of native coronary artery with unstable angina pectoris: Secondary | ICD-10-CM | POA: Diagnosis not present

## 2023-06-21 DIAGNOSIS — Z79890 Hormone replacement therapy (postmenopausal): Secondary | ICD-10-CM

## 2023-06-21 DIAGNOSIS — Z951 Presence of aortocoronary bypass graft: Secondary | ICD-10-CM

## 2023-06-21 DIAGNOSIS — I10 Essential (primary) hypertension: Secondary | ICD-10-CM | POA: Diagnosis not present

## 2023-06-21 DIAGNOSIS — Z7984 Long term (current) use of oral hypoglycemic drugs: Secondary | ICD-10-CM

## 2023-06-21 DIAGNOSIS — Z87891 Personal history of nicotine dependence: Secondary | ICD-10-CM

## 2023-06-21 DIAGNOSIS — I6782 Cerebral ischemia: Secondary | ICD-10-CM | POA: Diagnosis not present

## 2023-06-21 LAB — I-STAT CHEM 8, ED
BUN: 18 mg/dL (ref 8–23)
Calcium, Ion: 1.08 mmol/L — ABNORMAL LOW (ref 1.15–1.40)
Chloride: 104 mmol/L (ref 98–111)
Creatinine, Ser: 1.5 mg/dL — ABNORMAL HIGH (ref 0.61–1.24)
Glucose, Bld: 196 mg/dL — ABNORMAL HIGH (ref 70–99)
HCT: 43 % (ref 39.0–52.0)
Hemoglobin: 14.6 g/dL (ref 13.0–17.0)
Potassium: 3.9 mmol/L (ref 3.5–5.1)
Sodium: 140 mmol/L (ref 135–145)
TCO2: 26 mmol/L (ref 22–32)

## 2023-06-21 LAB — CBC
HCT: 42.8 % (ref 39.0–52.0)
HCT: 40.1 % (ref 39.0–52.0)
Hemoglobin: 14.4 g/dL (ref 13.0–17.0)
Hemoglobin: 13.6 g/dL (ref 13.0–17.0)
MCH: 31.3 pg (ref 26.0–34.0)
MCH: 31.6 pg (ref 26.0–34.0)
MCHC: 33.6 g/dL (ref 30.0–36.0)
MCHC: 33.9 g/dL (ref 30.0–36.0)
MCV: 93 fL (ref 80.0–100.0)
MCV: 93.3 fL (ref 80.0–100.0)
Platelets: 180 10*3/uL (ref 150–400)
Platelets: 167 10*3/uL (ref 150–400)
RBC: 4.6 MIL/uL (ref 4.22–5.81)
RBC: 4.3 MIL/uL (ref 4.22–5.81)
RDW: 12.7 % (ref 11.5–15.5)
RDW: 12.7 % (ref 11.5–15.5)
WBC: 8.3 10*3/uL (ref 4.0–10.5)
WBC: 9.3 10*3/uL (ref 4.0–10.5)
nRBC: 0 % (ref 0.0–0.2)
nRBC: 0 % (ref 0.0–0.2)

## 2023-06-21 LAB — LIPID PANEL
Cholesterol: 226 mg/dL — ABNORMAL HIGH (ref 0–200)
HDL: 33 mg/dL — ABNORMAL LOW (ref >40)
LDL Cholesterol: 165 mg/dL — ABNORMAL HIGH (ref 0–99)
Total CHOL/HDL Ratio: 6.8 RATIO
Triglycerides: 139 mg/dL (ref <150)
VLDL: 28 mg/dL (ref 0–40)

## 2023-06-21 LAB — BASIC METABOLIC PANEL
Anion gap: 10 (ref 5–15)
BUN: 16 mg/dL (ref 8–23)
CO2: 28 mmol/L (ref 22–32)
Calcium: 8.9 mg/dL (ref 8.9–10.3)
Chloride: 103 mmol/L (ref 98–111)
Creatinine, Ser: 1.38 mg/dL — ABNORMAL HIGH (ref 0.61–1.24)
GFR, Estimated: 52 mL/min — ABNORMAL LOW (ref >60)
Glucose, Bld: 125 mg/dL — ABNORMAL HIGH (ref 70–99)
Potassium: 3.6 mmol/L (ref 3.5–5.1)
Sodium: 141 mmol/L (ref 135–145)

## 2023-06-21 LAB — COMPREHENSIVE METABOLIC PANEL
ALT: 14 U/L (ref 0–44)
AST: 19 U/L (ref 15–41)
Albumin: 3.8 g/dL (ref 3.5–5.0)
Alkaline Phosphatase: 59 U/L (ref 38–126)
Anion gap: 10 (ref 5–15)
BUN: 16 mg/dL (ref 8–23)
CO2: 25 mmol/L (ref 22–32)
Calcium: 9 mg/dL (ref 8.9–10.3)
Chloride: 102 mmol/L (ref 98–111)
Creatinine, Ser: 1.37 mg/dL — ABNORMAL HIGH (ref 0.61–1.24)
GFR, Estimated: 53 mL/min — ABNORMAL LOW (ref >60)
Glucose, Bld: 199 mg/dL — ABNORMAL HIGH (ref 70–99)
Potassium: 3.8 mmol/L (ref 3.5–5.1)
Sodium: 137 mmol/L (ref 135–145)
Total Bilirubin: 0.6 mg/dL (ref 0.3–1.2)
Total Protein: 7.1 g/dL (ref 6.5–8.1)

## 2023-06-21 LAB — GLUCOSE, CAPILLARY
Glucose-Capillary: 85 mg/dL (ref 70–99)
Glucose-Capillary: 89 mg/dL (ref 70–99)
Glucose-Capillary: 135 mg/dL — ABNORMAL HIGH (ref 70–99)
Glucose-Capillary: 113 mg/dL — ABNORMAL HIGH (ref 70–99)
Glucose-Capillary: 149 mg/dL — ABNORMAL HIGH (ref 70–99)

## 2023-06-21 LAB — DIFFERENTIAL
Abs Immature Granulocytes: 0.03 10*3/uL (ref 0.00–0.07)
Basophils Absolute: 0 10*3/uL (ref 0.0–0.1)
Basophils Relative: 0 %
Eosinophils Absolute: 0.3 10*3/uL (ref 0.0–0.5)
Eosinophils Relative: 3 %
Immature Granulocytes: 0 %
Lymphocytes Relative: 41 %
Lymphs Abs: 3.4 10*3/uL (ref 0.7–4.0)
Monocytes Absolute: 0.9 10*3/uL (ref 0.1–1.0)
Monocytes Relative: 10 %
Neutro Abs: 3.7 10*3/uL (ref 1.7–7.7)
Neutrophils Relative %: 46 %

## 2023-06-21 LAB — CBG MONITORING, ED: Glucose-Capillary: 208 mg/dL — ABNORMAL HIGH (ref 70–99)

## 2023-06-21 LAB — HEMOGLOBIN A1C
Hgb A1c MFr Bld: 8.8 % — ABNORMAL HIGH (ref 4.8–5.6)
Mean Plasma Glucose: 205.86 mg/dL

## 2023-06-21 LAB — PROTIME-INR
INR: 0.9 (ref 0.8–1.2)
Prothrombin Time: 12.8 seconds (ref 11.4–15.2)

## 2023-06-21 LAB — ETHANOL: Alcohol, Ethyl (B): <10 mg/dL (ref <10)

## 2023-06-21 LAB — APTT: aPTT: 26 seconds (ref 24–36)

## 2023-06-21 MED ORDER — SODIUM CHLORIDE 0.9% FLUSH: 3.0000 mL | Freq: Once | INTRAVENOUS | Status: DC

## 2023-06-21 MED ORDER — ASPIRIN 81 MG PO TBEC
81.0000 mg | DELAYED_RELEASE_TABLET | Freq: Every day | ORAL | Status: DC
  Administered 2023-06-21 – 2023-06-23 (×3): 81 mg via ORAL
  Filled 2023-06-21 (×3): qty 1

## 2023-06-21 MED ORDER — INSULIN ASPART 100 UNIT/ML IJ SOLN
0.0000 [IU] | Freq: Three times a day (TID) | INTRAMUSCULAR | Status: DC
  Administered 2023-06-22 (×2): 1 [IU] via SUBCUTANEOUS

## 2023-06-21 MED ORDER — ASPIRIN 81 MG PO CHEW
81.0000 mg | CHEWABLE_TABLET | Freq: Once | ORAL | Status: AC
  Administered 2023-06-21: 81 mg via ORAL
  Filled 2023-06-21: qty 1

## 2023-06-21 MED ORDER — INSULIN GLARGINE-YFGN 100 UNIT/ML ~~LOC~~ SOLN
45.0000 [IU] | Freq: Every day | SUBCUTANEOUS | Status: DC
  Administered 2023-06-21 – 2023-06-22 (×2): 45 [IU] via SUBCUTANEOUS
  Filled 2023-06-21 (×4): qty 0.45

## 2023-06-21 MED ORDER — SODIUM CHLORIDE 0.9 % IV SOLN: INTRAVENOUS | Status: AC

## 2023-06-21 MED ORDER — CLOPIDOGREL BISULFATE 75 MG PO TABS
75.0000 mg | ORAL_TABLET | Freq: Every day | ORAL | Status: DC
  Administered 2023-06-21 – 2023-06-22 (×2): 75 mg via ORAL
  Filled 2023-06-21 (×2): qty 1

## 2023-06-21 MED ORDER — ACETAMINOPHEN 160 MG/5ML PO SOLN: 650.0000 mg | ORAL | Status: DC | PRN

## 2023-06-21 MED ORDER — LEVOTHYROXINE SODIUM 75 MCG PO TABS
75.0000 ug | ORAL_TABLET | Freq: Every day | ORAL | Status: DC
  Administered 2023-06-21 – 2023-06-23 (×3): 75 ug via ORAL
  Filled 2023-06-21 (×3): qty 1

## 2023-06-21 MED ORDER — INSULIN ASPART 100 UNIT/ML IJ SOLN: 0.0000 [IU] | Freq: Every day | INTRAMUSCULAR | Status: DC

## 2023-06-21 MED ORDER — ENOXAPARIN SODIUM 40 MG/0.4ML IJ SOSY
40.0000 mg | PREFILLED_SYRINGE | INTRAMUSCULAR | Status: DC
  Administered 2023-06-21 – 2023-06-23 (×3): 40 mg via SUBCUTANEOUS
  Filled 2023-06-21 (×3): qty 0.4

## 2023-06-21 MED ORDER — STROKE: EARLY STAGES OF RECOVERY BOOK
Freq: Once | Status: DC
  Filled 2023-06-21: qty 1

## 2023-06-21 MED ORDER — LORAZEPAM 1 MG PO TABS
1.0000 mg | ORAL_TABLET | Freq: Every day | ORAL | Status: AC
  Administered 2023-06-21 – 2023-06-22 (×2): 1 mg via ORAL
  Filled 2023-06-21 (×2): qty 1

## 2023-06-21 MED ORDER — ACETAMINOPHEN 325 MG PO TABS
650.0000 mg | ORAL_TABLET | ORAL | Status: DC | PRN
  Administered 2023-06-22: 650 mg via ORAL
  Filled 2023-06-21: qty 2

## 2023-06-21 MED ORDER — SENNOSIDES-DOCUSATE SODIUM 8.6-50 MG PO TABS: 1.0000 | ORAL_TABLET | Freq: Every evening | ORAL | Status: DC | PRN

## 2023-06-21 MED ORDER — ATORVASTATIN CALCIUM 40 MG PO TABS
40.0000 mg | ORAL_TABLET | Freq: Every day | ORAL | Status: DC
  Administered 2023-06-21 – 2023-06-23 (×3): 40 mg via ORAL
  Filled 2023-06-21 (×3): qty 1

## 2023-06-21 MED ORDER — ACETAMINOPHEN 650 MG RE SUPP: 650.0000 mg | RECTAL | Status: DC | PRN

## 2023-06-21 NOTE — Plan of Care (Signed)
  Problem: Acute Rehab OT Goals (only OT should resolve) Goal: Pt. Will Perform Grooming Flowsheets (Taken 06/21/2023 0936) Pt Will Perform Grooming:  with modified independence  standing Goal: Pt. Will Perform Lower Body Bathing Flowsheets (Taken 06/21/2023 0936) Pt Will Perform Lower Body Bathing:  Independently  sitting/lateral leans Goal: Pt. Will Perform Lower Body Dressing Flowsheets (Taken 06/21/2023 0936) Pt Will Perform Lower Body Dressing:  Independently  sitting/lateral leans Goal: Pt. Will Transfer To Toilet Flowsheets (Taken 06/21/2023 [phone]) Pt Will Transfer to Toilet:  with contact guard assist  stand pivot transfer Goal: Pt. Will Perform Toileting-Clothing Manipulation Flowsheets (Taken 06/21/2023 0936) Pt Will Perform Toileting - Clothing Manipulation and hygiene:  Independently  sitting/lateral leans Goal: Pt/Caregiver Will Perform Home Exercise Program Flowsheets (Taken 06/21/2023 [phone]) Pt/caregiver will Perform Home Exercise Program:  Increased strength  Left upper extremity  Independently    OT, MOT

## 2023-06-21 NOTE — ED Notes (Signed)
Stroke MD reports no TNK and Q4H neuro checks.

## 2023-06-21 NOTE — Progress Notes (Signed)
   06/21/23 1003  TOC Brief Assessment  Insurance and Status Reviewed  Patient has primary care physician Yes  Home environment has been reviewed Home  Prior level of function: needs assistance  Prior/Current Home Services No current home services  Social Determinants of Health Reivew SDOH reviewed no interventions necessary  Readmission risk has been reviewed Yes  Transition of care needs no transition of care needs at this time   PT eval Patient has been recommended for CIR. Team updated, Waiting to see if CIR can get AUTH. TOC following.

## 2023-06-21 NOTE — Evaluation (Signed)
Physical Therapy Evaluation Patient Details Name: Hunter Sparks MRN: [id number] DOB: December 19, 1945 Today's Date: 06/21/2023  History of Present Illness  Hunter Sparks is a pleasant 77 y.o. male with medical history significant for pretension, hyperlipidemia, type 2 diabetes mellitus, CAD, and hypothyroidism who presents to the emergency department for evaluation of slurred speech and left-sided weakness.     Patient reports he was in his usual state and had an uneventful day when he got into bed shortly before 11 PM.  Few minutes later, he was having trouble lifting his left arm.  His wife noticed that he was slurring his words and was unable to lift his left arm or his left leg.  Symptoms began improving prior to arrival in the ED and patient reports feeling back to his usual self by time of admission.   Clinical Impression  Patient demonstrates slow labored movement for sitting up at bedside with frequent leaning backwards and to the left, unable to stand without AD due to Left sided weakness and poor coordination of left side, mostly shuffling on LLE when taking side steps, unable to maintain balance when attempting to take forward steps using RW and had to sit after a few side steps due to fall risk and left sided weakness.  Patient tolerated sitting up in chair with spouse present after therapy.  Patient will benefit from continued skilled physical therapy in hospital and recommended venue below to increase strength, balance, endurance for safe ADLs and gait.           If plan is discharge home, recommend the following: A lot of help with bathing/dressing/bathroom;A lot of help with walking and/or transfers;Help with stairs or ramp for entrance;Assistance with cooking/housework   Can travel by private vehicle        Equipment Recommendations Rolling walker (2 wheels)  Recommendations for Other Services       Functional Status Assessment Patient has had a recent decline in their  functional status and demonstrates the ability to make significant improvements in function in a reasonable and predictable amount of time.     Precautions / Restrictions Precautions Precautions: Fall Restrictions Weight Bearing Restrictions: No      Mobility  Bed Mobility Overal bed mobility: Needs Assistance Bed Mobility: Supine to Sit     Supine to sit: Mod assist     General bed mobility comments: increased time, labored movement    Transfers Overall transfer level: Needs assistance Equipment used: Rolling walker (2 wheels) Transfers: Sit to/from Stand, Bed to chair/wheelchair/BSC Sit to Stand: Mod assist, Max assist   Step pivot transfers: Max assist       General transfer comment: very unsteady labored movement with leaning to the left and difficulty advancing LLE due to weakness    Ambulation/Gait Ambulation/Gait assistance: Max assist Gait Distance (Feet): 5 Feet Assistive device: Rolling walker (2 wheels) Gait Pattern/deviations: Decreased step length - right, Decreased step length - left, Decreased stride length, Knees buckling, Ataxic, Shuffle Gait velocity: slow     General Gait Details: limited to a few slow labored side steps with frequent buckling of LLE due to weakness, required tactile assistance for moving LLE  Stairs            Wheelchair Mobility     Tilt Bed    Modified Rankin (Stroke Patients Only)       Balance Overall balance assessment: Needs assistance Sitting-balance support: Feet supported, No upper extremity supported Sitting balance-Leahy Scale: Poor Sitting balance - Comments:  fair/poor seated at EOB Postural control: Posterior lean, Left lateral lean Standing balance support: Bilateral upper extremity supported, During functional activity, Reliant on assistive device for balance Standing balance-Leahy Scale: Poor Standing balance comment: using RW                             Pertinent Vitals/Pain  Pain Assessment Pain Assessment: Faces Faces Pain Scale: Hurts even more Pain Location: headache Pain Descriptors / Indicators: Headache Pain Intervention(s): Limited activity within patient's tolerance, Monitored during session, Repositioned    Home Living Family/patient expects to be discharged to:: Private residence Living Arrangements: Spouse/significant other Available Help at Discharge: Family;Available 24 hours/day Type of Home: House Home Access: Stairs to enter Entrance Stairs-Rails: None Entrance Stairs-Number of Steps: 3   Home Layout: One level Home Equipment: None      Prior Function Prior Level of Function : Independent/Modified Independent;Driving             Mobility Comments: Tourist information centre manager without AD ADLs Comments: Independent; drives     Extremity/Trunk Assessment   Upper Extremity Assessment Upper Extremity Assessment: Defer to OT evaluation LUE Deficits / Details: 4-/5  for shoulder abduction and flexion; WNL otherwise for strength. Mild gross motor coordination deficts for finger to nose test. LUE Sensation: WNL LUE Coordination: decreased gross motor    Lower Extremity Assessment Lower Extremity Assessment: Generalized weakness;LLE deficits/detail LLE Deficits / Details: grossly 3+/5 LLE Sensation: decreased light touch;decreased proprioception LLE Coordination: decreased gross motor    Cervical / Trunk Assessment Cervical / Trunk Assessment: Normal  Communication   Communication Communication: No apparent difficulties  Cognition Arousal: Alert Behavior During Therapy: WFL for tasks assessed/performed Overall Cognitive Status: Within Functional Limits for tasks assessed                                          General Comments      Exercises     Assessment/Plan    PT Assessment Patient needs continued PT services  PT Problem List Decreased strength;Decreased activity tolerance;Decreased balance;Decreased  mobility;Impaired sensation;Decreased coordination       PT Treatment Interventions DME instruction;Gait training;Stair training;Functional mobility training;Therapeutic activities;Therapeutic exercise;Balance training;Patient/family education;Neuromuscular re-education    PT Goals (Current goals can be found in the Care Plan section)  Acute Rehab PT Goals Patient Stated Goal: return  home after rehab PT Goal Formulation: With patient/family Time For Goal Achievement: 07/05/23 Potential to Achieve Goals: Good    Frequency Min 4X/week     Co-evaluation PT/OT/SLP Co-Evaluation/Treatment: Yes Reason for Co-Treatment: To address functional/ADL transfers PT goals addressed during session: Mobility/safety with mobility;Balance;Proper use of DME OT goals addressed during session: ADL's and self-care       AM-PAC PT "6 Clicks" Mobility  Outcome Measure Help needed turning from your back to your side while in a flat bed without using bedrails?: A Little Help needed moving from lying on your back to sitting on the side of a flat bed without using bedrails?: A Lot Help needed moving to and from a bed to a chair (including a wheelchair)?: A Lot Help needed standing up from a chair using your arms (e.g., wheelchair or bedside chair)?: A Lot Help needed to walk in hospital room?: A Lot Help needed climbing 3-5 steps with a railing? : Total 6 Click Score: 12    End of  Session   Activity Tolerance: Patient tolerated treatment well;Patient limited by fatigue Patient left: in chair;with call bell/phone within reach;with family/visitor present Nurse Communication: Mobility status PT Visit Diagnosis: Unsteadiness on feet (R26.81);Other abnormalities of gait and mobility (R26.89);Muscle weakness (generalized) (M62.81)    Time: 3295-1884 PT Time Calculation (min) (ACUTE ONLY): 30 min   Charges:   PT Evaluation $PT Eval Moderate Complexity: 1 Mod PT Treatments $Therapeutic Activity: 23-37  mins PT General Charges $$ ACUTE PT VISIT: 1 Visit         11:47 AM, 06/21/23 Ocie Bob, MPT Physical Therapist with [facility] [phone] office [phone] mobile phone

## 2023-06-21 NOTE — ED Notes (Signed)
Attempted report 

## 2023-06-21 NOTE — ED Notes (Signed)
Patient transported to CT 

## 2023-06-21 NOTE — H&P (Signed)
History and Physical    ENSO YOKE ZOX:[id number] DOB: January 13, 1946 DOA: 06/21/2023  PCP: Benita Stabile, MD   Patient coming from: Home  Chief Complaint: Slurred speech, left-sided weakness   HPI: Hunter Sparks is a pleasant 77 y.o. male with medical history significant for pretension, hyperlipidemia, type 2 diabetes mellitus, CAD, and hypothyroidism who presents to the emergency department for evaluation of slurred speech and left-sided weakness.  Patient reports he was in his usual state and had an uneventful day when he got into bed shortly before 11 PM.  Few minutes later, he was having trouble lifting his left arm.  His wife noticed that he was slurring his words and was unable to lift his left arm or his left leg.  Symptoms began improving prior to arrival in the ED and patient reports feeling back to his usual self by time of admission.  ED Course: Upon arrival to the ED, patient is found to be afebrile and saturating well on room air with elevated blood pressure.  EKG demonstrates sinus rhythm.  Labs are most notable for creatinine 1.37.  Head CT is negative for acute intracranial abnormality.  Patient was evaluated by neurology in the emergency department and medical admission was recommended for further evaluation.  He passed a bedside swallow screen and was given 81 mg of aspirin.  Review of Systems:  All other systems reviewed and apart from HPI, are negative.  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HCC)    Arthritis    "back, hips, legs" (06/10/2015)   Chest pain    Chronic lower back pain    Depression    DJD (degenerative joint disease)    Hyperlipidemia 06/11/2015   Hypertension    Hypothyroidism    Kidney stone    Kidney stone    Type II diabetes mellitus (HCC)     Past Surgical History:  Procedure Laterality Date   CARDIAC CATHETERIZATION N/A 06/11/2015   Procedure: Left Heart Cath and Coronary Angiography;  Surgeon: Lyn Records, MD;   Location: [facility] INVASIVE CV LAB;  Service: Cardiovascular;  Laterality: N/A;   COLONOSCOPY  06/21/2011   Procedure: COLONOSCOPY;  Surgeon: Malissa Hippo, MD;  Location: [facility];  Service: Endoscopy;  Laterality: N/A;   COLONOSCOPY     COLONOSCOPY N/A 09/20/2017   Procedure: COLONOSCOPY;  Surgeon: Malissa Hippo, MD;  Location: [facility];  Service: Endoscopy;  Laterality: N/A;  1200   CORONARY ARTERY BYPASS GRAFT N/A 06/14/2015   Procedure: CORONARY ARTERY BYPASS GRAFTING times four using Left Internal mammary artery and right leg Saphenous vein graft;  Surgeon: Kerin Perna, MD;  Location: [facility];  Service: Open Heart Surgery;  Laterality: N/A;   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   HAND SURGERY     SKIN CANCER EXCISION Left    "forearm"   TEE WITHOUT CARDIOVERSION N/A 06/14/2015   Procedure: TRANSESOPHAGEAL ECHOCARDIOGRAM (TEE);  Surgeon: Kerin Perna, MD;  Location: [facility] OR;  Service: Open Heart Surgery;  Laterality: N/A;    Social History:   reports that he quit smoking about 19 years ago. His smoking use included cigarettes. He started smoking about 64 years ago. He has a 90 pack-year smoking history. His smokeless tobacco use includes chew. He reports that he does not drink alcohol and does not use drugs.  Allergies  Allergen Reactions   Penicillins Rash    Has patient had a PCN reaction causing immediate  rash, facial/tongue/throat swelling, SOB or lightheadedness with hypotension: Yes Has patient had a PCN reaction causing severe rash involving mucus membranes or skin necrosis: No Has patient had a PCN reaction that required hospitalization: No Has patient had a PCN reaction occurring within the last 10 years: No If all of the above answers are "NO", then may proceed with Cephalosporin use.     Family History  Problem Relation Age of Onset   Stroke Mother    Hypertension Mother    Stomach cancer Father    Liver cancer Father    Cancer Father    Rectal  cancer Brother    Cancer Brother    Heart attack Neg Hx      Prior to Admission medications   Medication Sig Start Date End Date Taking? Authorizing Provider  atorvastatin (LIPITOR) 40 MG tablet atorvastatin 40 mg tablet    [provider]  blood glucose meter kit and supplies 1 each by Other route 4 (four) times daily. Dispense based on patient and insurance preference. Use up to four times daily as directed. (FOR ICD-10 E10.9, E11.9). 01/16/20   Roma Kayser, MD  chlorhexidine (HIBICLENS) 4 % external liquid Apply topically daily as needed. 06/20/22   Particia Nearing, PA-C  cyclobenzaprine (FLEXERIL) 5 MG tablet Take 1 tablet (5 mg total) by mouth 3 (three) times daily as needed for muscle spasms. Do not drink alcohol or drive while taking this medication.  May cause drowsiness. 06/20/22   Particia Nearing, PA-C  doxycycline (VIBRAMYCIN) 100 MG capsule Take 1 capsule (100 mg total) by mouth 2 (two) times daily. 06/20/22   Particia Nearing, PA-C  Evolocumab (REPATHA SURECLICK) 140 MG/ML SOAJ Inject 140 mg into the skin every 14 (fourteen) days. 04/04/21   Creig Hines, NP  glipiZIDE (GLUCOTROL XL) 5 MG 24 hr tablet Take 1 tablet (5 mg total) by mouth daily with breakfast. 01/21/20   Nida, Denman George, MD  glucose blood (ONETOUCH VERIO) test strip 1 each by Other route 4 (four) times daily. Use as instructed 10/27/19   Roma Kayser, MD  Insulin Glargine, 1 Unit Dial, 300 UNIT/ML SOPN Inject 54 Units into the skin at bedtime. 11/12/19   Roma Kayser, MD  Insulin Pen Needle (B-D ULTRAFINE III SHORT PEN) 31G X 8 MM MISC 1 each by Does not apply route as directed. 10/27/19   Roma Kayser, MD  levothyroxine (SYNTHROID) 75 MCG tablet TAKE 1 TABLET BY MOUTH ONCE DAILY BEFORE BREAKFAST 10/16/19   Roma Kayser, MD  lidocaine (LIDODERM) 5 % Place 1 patch onto the skin daily. Remove & Discard patch within 12 hours or as directed  by MD 12/03/21   Triplett, Tammy, PA-C  losartan (COZAAR) 25 MG tablet Take 1 tablet (25 mg total) by mouth daily. 06/19/23   Antoine Poche, MD  metoprolol tartrate (LOPRESSOR) 25 MG tablet Take 1 tablet (25 mg total) by mouth 2 (two) times daily. 06/19/23   Antoine Poche, MD  mupirocin ointment (BACTROBAN) 2 % Apply 1 Application topically 2 (two) times daily. 06/20/22   Particia Nearing, PA-C  traMADol (ULTRAM) 50 MG tablet Take 1 tablet (50 mg total) by mouth every 6 (six) hours as needed. pain 06/19/15   Zada Girt    Physical Exam: Vitals:   06/21/23 0043 06/21/23 0054 06/21/23 0057 06/21/23 0130  BP:  (!) 152/104  (!) 152/73  Pulse:  81  85  Resp:  14  16  Temp:  98.5 F (36.9 C)    TempSrc:  Oral    SpO2: 97% 97%  91%  Weight:   98 kg   Height:   6' (1.829 m)      Constitutional: NAD, calm  Eyes: PERTLA, lids and conjunctivae normal ENMT: Mucous membranes are moist. Posterior pharynx clear of any exudate or lesions.   Neck: supple, no masses  Respiratory:  no wheezing, no crackles. No accessory muscle use.  Cardiovascular: S1 & S2 heard, regular rate and rhythm. No extremity edema.   Abdomen: No distension, no tenderness, soft. Bowel sounds active.  Musculoskeletal: no clubbing / cyanosis. No joint deformity upper and lower extremities.   Skin: no significant rashes, lesions, ulcers. Warm, dry, well-perfused. Neurologic: CN 2-12 grossly intact. Sensation to light touch intact. Strength 5/5 in all 4 limbs. Alert and oriented.  Psychiatric: Pleasant. Cooperative.    Labs and Imaging on Admission: I have personally reviewed following labs and imaging studies  CBC: Recent Labs  Lab 06/21/23 0041 06/21/23 0046  WBC 8.3  --   NEUTROABS 3.7  --   HGB 14.4 14.6  HCT 42.8 43.0  MCV 93.0  --   PLT 180  --    Basic Metabolic Panel: Recent Labs  Lab 06/21/23 0041 06/21/23 0046  NA 137 140  K 3.8 3.9  CL 102 104  CO2 25  --   GLUCOSE 199* 196*   BUN 16 18  CREATININE 1.37* 1.50*  CALCIUM 9.0  --    GFR: Estimated Creatinine Clearance: 49.3 mL/min (A) (by C-G formula based on SCr of 1.5 mg/dL (H)). Liver Function Tests: Recent Labs  Lab 06/21/23 0041  AST 19  ALT 14  ALKPHOS 59  BILITOT 0.6  PROT 7.1  ALBUMIN 3.8   No results for input(s): "LIPASE", "AMYLASE" in the last 168 hours. No results for input(s): "AMMONIA" in the last 168 hours. Coagulation Profile: Recent Labs  Lab 06/21/23 0041  INR 0.9   Cardiac Enzymes: No results for input(s): "CKTOTAL", "CKMB", "CKMBINDEX", "TROPONINI" in the last 168 hours. BNP (last 3 results) No results for input(s): "PROBNP" in the last 8760 hours. HbA1C: No results for input(s): "HGBA1C" in the last 72 hours. CBG: Recent Labs  Lab 06/21/23 0036  GLUCAP 208*   Lipid Profile: No results for input(s): "CHOL", "HDL", "LDLCALC", "TRIG", "CHOLHDL", "LDLDIRECT" in the last 72 hours. Thyroid Function Tests: No results for input(s): "TSH", "T4TOTAL", "FREET4", "T3FREE", "THYROIDAB" in the last 72 hours. Anemia Panel: No results for input(s): "VITAMINB12", "FOLATE", "FERRITIN", "TIBC", "IRON", "RETICCTPCT" in the last 72 hours. Urine analysis:    Component Value Date/Time   COLORURINE YELLOW 12/03/2021 1552   APPEARANCEUR CLEAR 12/03/2021 1552   LABSPEC 1.017 12/03/2021 1552   PHURINE 5.0 12/03/2021 1552   GLUCOSEU 50 (A) 12/03/2021 1552   HGBUR NEGATIVE 12/03/2021 1552   BILIRUBINUR NEGATIVE 12/03/2021 1552   KETONESUR NEGATIVE 12/03/2021 1552   PROTEINUR NEGATIVE 12/03/2021 1552   UROBILINOGEN 1.0 06/13/2015 0945   NITRITE NEGATIVE 12/03/2021 1552   LEUKOCYTESUR NEGATIVE 12/03/2021 1552   Sepsis Labs: @LABRCNTIP (procalcitonin:4,lacticidven:4) )No results found for this or any previous visit (from the past 240 hour(s)).   Radiological Exams on Admission: CT HEAD CODE STROKE WO CONTRAST  Result Date: 06/21/2023 CLINICAL DATA:  Code stroke.  Acute neurologic deficit  EXAM: CT HEAD WITHOUT CONTRAST TECHNIQUE: Contiguous axial images were obtained from the base of the skull through the vertex without intravenous contrast. RADIATION DOSE REDUCTION: This exam  was performed according to the departmental dose-optimization program which includes automated exposure control, adjustment of the mA and/or kV according to patient size and/or use of iterative reconstruction technique. COMPARISON:  None Available. FINDINGS: Brain: There is no mass, hemorrhage or extra-axial collection. There is generalized atrophy without lobar predilection. Old bilateral cerebellar infarcts. There is periventricular hypoattenuation compatible with chronic microvascular disease. Vascular: No abnormal hyperdensity of the major intracranial arteries or dural venous sinuses. No intracranial atherosclerosis. Skull: The visualized skull base, calvarium and extracranial soft tissues are normal. Sinuses/Orbits: No fluid levels or advanced mucosal thickening of the visualized paranasal sinuses. No mastoid or middle ear effusion. The orbits are normal. ASPECTS [facility] Stroke Program Early CT Score) - Ganglionic level infarction (caudate, lentiform nuclei, internal capsule, insula, M1-M3 cortex): 7 - Supraganglionic infarction (M4-M6 cortex): 3 Total score (0-10 with 10 being normal): 10 IMPRESSION: 1. No acute intracranial abnormality. 2. ASPECTS is 10. These results were called by telephone at the time of interpretation on 06/21/2023 at 12:47 am to provider Geoffery Lyons , who verbally acknowledged these results. Electronically Signed   By: Deatra Robinson M.D.   On: 06/21/2023 00:47    EKG: Independently reviewed. Sinus rhythm, RBBB.   Assessment/Plan   1. Dysarthria; left-sided weakness  - Check MRI brain, carotid US, lipids, and A1c, continue cardiac monitoring and neuro checks, continue ASA 81 mg daily   2. CAD  - No anginal symptoms    - Starting ASA 81 per neuro recommendations   3. Hypertension  -  Permissive HTN to 220/120 for now    4. Insulin-dependent DM  - A1c was 9.4% in 2023  - Check CBGs, update A1c, continue treatment with insulin    5. CKD 3A  - Appears close to baseline  - Renally-dose medications, monitor   6. Hypothyroidism  - Synthroid     DVT prophylaxis: Lovenox  Code Status: Full  Level of Care: Level of care: Telemetry Family Communication: Wife and son at bedside   Disposition Plan:  Patient is from: Home Anticipated d/c is to: Home  Anticipated d/c date is: 06/22/23  Patient currently: Pending TIA/CVA workup  Consults called: Neurology  Admission status: Observation     Briscoe Deutscher, MD [facility]  06/21/2023, 1:53 AM

## 2023-06-21 NOTE — ED Triage Notes (Signed)
Patient from home for reports of stroke-like symptoms. EMS reports patient LKN [id number]; patient was awake at the time. Symptoms of L side weakness and slurred speech. Upon EMS arrival, patient was able to stand with assistance; had equal facial symmetry, L side weakness. EMS reports symptoms have improved during transport; also reports BP 200/81, BS 209. Patient denies any blood thinners; history of cardiac stent. EMS placed an 18G IV in the LAC and RAC. Upon arrival to ER, patient is alert, code stroke initiated. ER MD at bedside and patient assessed by code stroke personnel then escorted to CT

## 2023-06-21 NOTE — ED Provider Notes (Signed)
[place] EMERGENCY DEPARTMENT AT [facility] Provider Note   CSN: [id number] Arrival date & time: 06/21/23  6962  An emergency department physician performed an initial assessment on this suspected stroke patient at 1230.  History  Chief Complaint  Patient presents with   Code Stroke    Hunter Sparks is a 77 y.o. male.  Patient is a 77 year old male with past medical history of type 2 diabetes, hypothyroidism, hyperlipidemia, hypertension, and coronary artery disease with CABG x 4.  Patient presenting today with complaints of left arm and leg weakness.  This started this evening while he was in bed.  He attempted to roll over in bed, however his left arm and left leg were flaccid.  EMS was called and patient was transported here with rapidly improving symptoms.  Also of note is that the patient did take a CBD gummy around 9 or 10:00.  He has taken these in the past without these symptoms.  The history is provided by the patient.       Home Medications Prior to Admission medications   Medication Sig Start Date End Date Taking? Authorizing Provider  atorvastatin (LIPITOR) 40 MG tablet atorvastatin 40 mg tablet    [provider]  blood glucose meter kit and supplies 1 each by Other route 4 (four) times daily. Dispense based on patient and insurance preference. Use up to four times daily as directed. (FOR ICD-10 E10.9, E11.9). 01/16/20   Roma Kayser, MD  chlorhexidine (HIBICLENS) 4 % external liquid Apply topically daily as needed. 06/20/22   Particia Nearing, PA-C  cyclobenzaprine (FLEXERIL) 5 MG tablet Take 1 tablet (5 mg total) by mouth 3 (three) times daily as needed for muscle spasms. Do not drink alcohol or drive while taking this medication.  May cause drowsiness. 06/20/22   Particia Nearing, PA-C  doxycycline (VIBRAMYCIN) 100 MG capsule Take 1 capsule (100 mg total) by mouth 2 (two) times daily. 06/20/22   Particia Nearing, PA-C   Evolocumab (REPATHA SURECLICK) 140 MG/ML SOAJ Inject 140 mg into the skin every 14 (fourteen) days. 04/04/21   Creig Hines, NP  glipiZIDE (GLUCOTROL XL) 5 MG 24 hr tablet Take 1 tablet (5 mg total) by mouth daily with breakfast. 01/21/20   Nida, Denman George, MD  glucose blood (ONETOUCH VERIO) test strip 1 each by Other route 4 (four) times daily. Use as instructed 10/27/19   Roma Kayser, MD  Insulin Glargine, 1 Unit Dial, 300 UNIT/ML SOPN Inject 54 Units into the skin at bedtime. 11/12/19   Roma Kayser, MD  Insulin Pen Needle (B-D ULTRAFINE III SHORT PEN) 31G X 8 MM MISC 1 each by Does not apply route as directed. 10/27/19   Roma Kayser, MD  levothyroxine (SYNTHROID) 75 MCG tablet TAKE 1 TABLET BY MOUTH ONCE DAILY BEFORE BREAKFAST 10/16/19   Roma Kayser, MD  lidocaine (LIDODERM) 5 % Place 1 patch onto the skin daily. Remove & Discard patch within 12 hours or as directed by MD 12/03/21   Triplett, Tammy, PA-C  losartan (COZAAR) 25 MG tablet Take 1 tablet (25 mg total) by mouth daily. 06/19/23   Antoine Poche, MD  metoprolol tartrate (LOPRESSOR) 25 MG tablet Take 1 tablet (25 mg total) by mouth 2 (two) times daily. 06/19/23   Antoine Poche, MD  mupirocin ointment (BACTROBAN) 2 % Apply 1 Application topically 2 (two) times daily. 06/20/22   Particia Nearing, PA-C  traMADol Janean Sark) 50  MG tablet Take 1 tablet (50 mg total) by mouth every 6 (six) hours as needed. pain 06/19/15   Barrett, Erin R, PA-C      Allergies    Penicillins    Review of Systems   Review of Systems  All other systems reviewed and are negative.   Physical Exam Updated Vital Signs BP (!) 152/104 (BP Location: Right Arm)   Pulse 81   Temp 98.5 F (36.9 C) (Oral)   Resp 14   Ht 6' (1.829 m)   Wt 98 kg   SpO2 97%   BMI 29.29 kg/m  Physical Exam Vitals and nursing note reviewed.  Constitutional:      General: He is not in acute distress.    Appearance: He  is well-developed. He is not diaphoretic.  HENT:     Head: Normocephalic and atraumatic.  Cardiovascular:     Rate and Rhythm: Normal rate and regular rhythm.     Heart sounds: No murmur heard.    No friction rub.  Pulmonary:     Effort: Pulmonary effort is normal. No respiratory distress.     Breath sounds: Normal breath sounds. No wheezing or rales.  Abdominal:     General: Bowel sounds are normal. There is no distension.     Palpations: Abdomen is soft.     Tenderness: There is no abdominal tenderness.  Musculoskeletal:        General: Normal range of motion.     Cervical back: Normal range of motion and neck supple.  Skin:    General: Skin is warm and dry.  Neurological:     General: No focal deficit present.     Mental Status: He is alert and oriented to person, place, and time.     Cranial Nerves: No cranial nerve deficit.     Motor: No weakness.     Coordination: Coordination normal.     ED Results / Procedures / Treatments   Labs (all labs ordered are listed, but only abnormal results are displayed) Labs Reviewed  COMPREHENSIVE METABOLIC PANEL - Abnormal; Notable for the following components:      Result Value   Glucose, Bld 199 (*)    Creatinine, Ser 1.37 (*)    GFR, Estimated 53 (*)    All other components within normal limits  CBG MONITORING, ED - Abnormal; Notable for the following components:   Glucose-Capillary 208 (*)    All other components within normal limits  I-STAT CHEM 8, ED - Abnormal; Notable for the following components:   Creatinine, Ser 1.50 (*)    Glucose, Bld 196 (*)    Calcium, Ion 1.08 (*)    All other components within normal limits  PROTIME-INR  APTT  CBC  DIFFERENTIAL  ETHANOL  CBG MONITORING, ED    EKG None  Radiology CT HEAD CODE STROKE WO CONTRAST  Result Date: 06/21/2023 CLINICAL DATA:  Code stroke.  Acute neurologic deficit EXAM: CT HEAD WITHOUT CONTRAST TECHNIQUE: Contiguous axial images were obtained from the base of  the skull through the vertex without intravenous contrast. RADIATION DOSE REDUCTION: This exam was performed according to the departmental dose-optimization program which includes automated exposure control, adjustment of the mA and/or kV according to patient size and/or use of iterative reconstruction technique. COMPARISON:  None Available. FINDINGS: Brain: There is no mass, hemorrhage or extra-axial collection. There is generalized atrophy without lobar predilection. Old bilateral cerebellar infarcts. There is periventricular hypoattenuation compatible with chronic microvascular disease. Vascular: No abnormal hyperdensity  of the major intracranial arteries or dural venous sinuses. No intracranial atherosclerosis. Skull: The visualized skull base, calvarium and extracranial soft tissues are normal. Sinuses/Orbits: No fluid levels or advanced mucosal thickening of the visualized paranasal sinuses. No mastoid or middle ear effusion. The orbits are normal. ASPECTS [facility] Stroke Program Early CT Score) - Ganglionic level infarction (caudate, lentiform nuclei, internal capsule, insula, M1-M3 cortex): 7 - Supraganglionic infarction (M4-M6 cortex): 3 Total score (0-10 with 10 being normal): 10 IMPRESSION: 1. No acute intracranial abnormality. 2. ASPECTS is 10. These results were called by telephone at the time of interpretation on 06/21/2023 at 12:47 am to provider Geoffery Lyons , who verbally acknowledged these results. Electronically Signed   By: Deatra Robinson M.D.   On: 06/21/2023 00:47    Procedures Procedures    Medications Ordered in ED Medications  sodium chloride flush (NS) 0.9 % injection 3 mL (has no administration in time range)  aspirin chewable tablet 81 mg (has no administration in time range)    ED Course/ Medical Decision Making/ A&P  Patient is a 77 year old male presenting with complaints of left arm and left leg weakness.  This started shortly after he laid down to go to bed.  911 was  called and patient improved rapidly upon arrival.  Upon my exam, vitals are stable and neurologic exam is nonfocal.  Workup initiated including CBC, metabolic panel, coags, all of which were basically unremarkable.  CT scan of the head showing no acute intracranial process.  Patient arrived here as a code stroke, but as patient's symptoms are markedly improving the indication for tPA is not present.  Patient was seen by teleneurology who agrees with this assessment.  Patient will be admitted for further observation.  He will likely require an MRI and studies of his carotids.  I have spoken with Dr. Antionette Char who agrees to admit.  CRITICAL CARE Performed by: Geoffery Lyons Total critical care time: 35 minutes Critical care time was exclusive of separately billable procedures and treating other patients. Critical care was necessary to treat or prevent imminent or life-threatening deterioration. Critical care was time spent personally by me on the following activities: development of treatment plan with patient and/or surrogate as well as nursing, discussions with consultants, evaluation of patient's response to treatment, examination of patient, obtaining history from patient or surrogate, ordering and performing treatments and interventions, ordering and review of laboratory studies, ordering and review of radiographic studies, pulse oximetry and re-evaluation of patient's condition.   Final Clinical Impression(s) / ED Diagnoses Final diagnoses:  None    Rx / DC Orders ED Discharge Orders     None         Geoffery Lyons, MD 06/21/23 [phone]

## 2023-06-21 NOTE — ED Notes (Signed)
CODE STROKE PAGED @ 0020 upon EMS encode to [facility]

## 2023-06-21 NOTE — ED Notes (Signed)
During neuro exam with stroke MD, patient reports he took a "gummy" around "2100 or 220". Reports it is the "first one he has taken in 7/8 months and his son said it did him the same way"

## 2023-06-21 NOTE — Evaluation (Signed)
Occupational Therapy Evaluation Patient Details Name: Hunter Sparks MRN: [id number] DOB: Jul 05, 1946 Today's Date: 06/21/2023   History of Present Illness Hunter Sparks is a pleasant 77 y.o. male with medical history significant for pretension, hyperlipidemia, type 2 diabetes mellitus, CAD, and hypothyroidism who presents to the emergency department for evaluation of slurred speech and left-sided weakness.     Patient reports he was in his usual state and had an uneventful day when he got into bed shortly before 11 PM.  Few minutes later, he was having trouble lifting his left arm.  His wife noticed that he was slurring his words and was unable to lift his left arm or his left leg.  Symptoms began improving prior to arrival in the ED and patient reports feeling back to his usual self by time of admission. (per MD)   Clinical Impression   Pt agreeable to OT and PT co-evaluation. Pt was independent at baseline without AD. Today pt demonstrates mild weakness and decreased gross motor coordination in L UE. Primary deficit is L LE coordination as noted by max A for transfer to chair with RW with noted difficulty advancing L LE. Pt was able to don socks but required much time and assist for seated balance which went from poor to fair during session. Pt was left in the chair with family present. Pt will benefit from continued OT in the hospital and recommended venue below to increase strength, balance, and endurance for safe ADL's.         If plan is discharge home, recommend the following: A lot of help with walking and/or transfers;A little help with bathing/dressing/bathroom;Assistance with cooking/housework;Help with stairs or ramp for entrance;Assist for transportation    Functional Status Assessment  Patient has had a recent decline in their functional status and demonstrates the ability to make significant improvements in function in a reasonable and predictable amount of time.  Equipment  Recommendations  None recommended by OT           Precautions / Restrictions Precautions Precautions: Fall Restrictions Weight Bearing Restrictions: No      Mobility Bed Mobility Overal bed mobility: Needs Assistance Bed Mobility: Supine to Sit     Supine to sit: Mod assist     General bed mobility comments: labored effort; assist to pull to sit    Transfers Overall transfer level: Needs assistance Equipment used: Rolling walker (2 wheels) Transfers: Sit to/from Stand, Bed to chair/wheelchair/BSC Sit to Stand: Mod assist, Max assist     Step pivot transfers: Max assist     General transfer comment: Unsteady; difficulty advancing L LE.      Balance Overall balance assessment: Needs assistance Sitting-balance support: Bilateral upper extremity supported, Feet supported Sitting balance-Leahy Scale: Poor Sitting balance - Comments: poor to fair Postural control: Posterior lean, Left lateral lean Standing balance support: Bilateral upper extremity supported, During functional activity, Reliant on assistive device for balance Standing balance-Leahy Scale: Poor Standing balance comment: using RW                           ADL either performed or assessed with clinical judgement   ADL Overall ADL's : Needs assistance/impaired Eating/Feeding: Sitting;Independent   Grooming: Set up;Sitting   Upper Body Bathing: Set up;Sitting   Lower Body Bathing: Contact guard assist;Minimal assistance;Sitting/lateral leans   Upper Body Dressing : Set up;Sitting   Lower Body Dressing: Contact guard assist;Minimal assistance;Sitting/lateral leans Lower Body Dressing Details (indicate  cue type and reason): Much extended time and guarding assist to don socks seated at EOB. Toilet Transfer: Maximal assistance;Rolling walker (2 wheels);Stand-pivot Statistician Details (indicate cue type and reason): Simulated via EOB to chair transfer Toileting- Clothing Manipulation  and Hygiene: Minimal assistance;Contact guard assist;Sitting/lateral lean   Tub/ Shower Transfer: Maximal assistance;Rolling walker (2 wheels);Stand-pivot   Functional mobility during ADLs: Maximal assistance;Rolling walker (2 wheels)       Vision Baseline Vision/History: 1 Wears glasses Ability to See in Adequate Light: 0 Adequate Patient Visual Report: No change from baseline Vision Assessment?: No apparent visual deficits     Perception Perception: Not tested       Praxis Praxis: Impaired Praxis Impairment Details: Organization     Pertinent Vitals/Pain Pain Assessment Pain Assessment: 0-10 Pain Score: 10-Worst pain ever Pain Location: head Pain Descriptors / Indicators: Headache Pain Intervention(s): Limited activity within patient's tolerance, Monitored during session, Repositioned     Extremity/Trunk Assessment Upper Extremity Assessment Upper Extremity Assessment: LUE deficits/detail;Right hand dominant LUE Deficits / Details: 4-/5  for shoulder abduction and flexion; WNL otherwise for strength. Mild gross motor coordination deficts for finger to nose test. LUE Sensation: WNL LUE Coordination: decreased gross motor   Lower Extremity Assessment Lower Extremity Assessment: Defer to PT evaluation   Cervical / Trunk Assessment Cervical / Trunk Assessment: Normal   Communication Communication Communication: No apparent difficulties   Cognition Arousal: Alert Behavior During Therapy: WFL for tasks assessed/performed Overall Cognitive Status: Within Functional Limits for tasks assessed                                                        Home Living Family/patient expects to be discharged to:: Private residence Living Arrangements: Spouse/significant other Available Help at Discharge: Family;Available 24 hours/day Type of Home: House Home Access: Stairs to enter [employer] of Steps: 3 Entrance Stairs-Rails: None (wall on  one side) Home Layout: One level     Bathroom Shower/Tub: Chief Strategy Officer: Handicapped height Bathroom Accessibility: Yes How Accessible: Accessible via walker Home Equipment: None          Prior Functioning/Environment Prior Level of Function : Independent/Modified Independent             Mobility Comments: Tourist information centre manager without AD ADLs Comments: Independent; drives        OT Problem List: Decreased strength;Decreased range of motion;Decreased activity tolerance;Impaired balance (sitting and/or standing);Decreased coordination;Decreased safety awareness      OT Treatment/Interventions: Self-care/ADL training;Therapeutic exercise;DME and/or AE instruction;Therapeutic activities;Balance training;Canalith reposition;Patient/family education;Neuromuscular education    OT Goals(Current goals can be found in the care plan section) Acute Rehab OT Goals Patient Stated Goal: Get rehab to improve function. OT Goal Formulation: With patient/family Time For Goal Achievement: 07/05/23 Potential to Achieve Goals: Good  OT Frequency: Min 2X/week    Co-evaluation PT/OT/SLP Co-Evaluation/Treatment: Yes Reason for Co-Treatment: To address functional/ADL transfers   OT goals addressed during session: ADL's and self-care      AM-PAC OT "6 Clicks" Daily Activity     Outcome Measure Help from another person eating meals?: None Help from another person taking care of personal grooming?: A Little Help from another person toileting, which includes using toliet, bedpan, or urinal?: A Little Help from another person bathing (including washing, rinsing, drying)?: A Little Help from  another person to put on and taking off regular upper body clothing?: A Little Help from another person to put on and taking off regular lower body clothing?: A Little 6 Click Score: 19   End of Session Equipment Utilized During Treatment: Rolling walker (2 wheels);Gait belt Nurse  Communication: Mobility status  Activity Tolerance: Patient tolerated treatment well Patient left: in chair;with call bell/phone within reach;with family/visitor present  OT Visit Diagnosis: Unsteadiness on feet (R26.81);Other abnormalities of gait and mobility (R26.89);Muscle weakness (generalized) (M62.81);Hemiplegia and hemiparesis;Other symptoms and signs involving the nervous system (R29.898) Hemiplegia - Right/Left: Left Hemiplegia - dominant/non-dominant: Non-Dominant Hemiplegia - caused by: Cerebral infarction                Time: 4782-9562 OT Time Calculation (min): 21 min Charges:  OT General Charges $OT Visit: 1 Visit OT Evaluation $OT Eval Low Complexity: 1 Low    OT, MOT  Danie Chandler 06/21/2023, 9:33 AM

## 2023-06-21 NOTE — Consult Note (Addendum)
[facility] Consult Services   Patient Name:   Hunter Sparks, Hunter Sparks Date of Birth:   July 09, 1946 Identification Number:   MRN - [id number] Date of Service:   06/21/2023 00:42:35  Diagnosis:       I63.89 - Cerebrovascular accident (CVA) due to other mechanism [facility])  Impression:      77 yo M w/ PMHx of HTN, HLD, CAD, T2DM, who presents with left sided weakness now improved. NIHSS 2. Patient states his symptoms are improving and are no longer disabling for him. Due to lack of disabling symptoms, he is not a candidate for thrombolytic.    Recommend admission for stroke workup as below. I have placed orders for q4h neurochecks, aspirin 81mg  x1 (as long as patient passes bedside swallow screen), MRI Brain w/o contrast, carotid ultrasound. Plan communicated to ED MD and RN. Please call with any questions.  Our recommendations are outlined below.  Recommendations:        Stroke/Telemetry Floor       Neuro Checks q4h       Bedside Swallow Eval       DVT Prophylaxis       IV Fluids, Normal Saline       Head of Bed 30 Degrees       Euglycemia and Avoid Hyperthermia (PRN Acetaminophen)       Initiate or continue Aspirin 81 MG daily       Antihypertensives PRN if Blood pressure is greater than 220/120 or there is a concern for End organ damage/contraindications for permissive HTN. If blood pressure is greater than 220/120 give labetalol PO or IV or Vasotec IV with a goal of 15% reduction in BP during the first 24 hours.       MRI Brain w/o contrast       Carotid ultrasound       Lipid panel, a1c       statin for LDL goal < 70  Sign Out:       Discussed with Emergency Department Provider    ------------------------------------------------------------------------------  Advanced Imaging: Advanced Imaging Deferred because:  Non-disabling symptoms as verified by the patient; no cortical signs so not consistent with LVO   Metrics: Last Known Well: 06/20/2023  21:00:00 [facility] Notification Time: 06/21/2023 00:42:35 Arrival Time: 06/21/2023 00:32:00 Stamp Time: 06/21/2023 00:42:35 Initial Response Time: 06/21/2023 00:45:37 Symptoms: L sided weakness. Initial patient interaction: 06/21/2023 00:48:57 NIHSS Assessment Completed: 06/21/2023 00:58:38 Patient is not a candidate for Thrombolytic. Thrombolytic Medical Decision: 06/21/2023 00:58:39 Patient was not deemed candidate for Thrombolytic because of following reasons: Last Well Known Above 4.5 Hours.  I personally Reviewed the CT Head and it Showed no acute hemorrhage or acute core infarct  Primary Provider Notified of Diagnostic Impression and Management Plan on: 06/21/2023 00:52:00    ------------------------------------------------------------------------------  History of Present Illness: Patient is a 78 year old Male.  Patient was brought by EMS for symptoms of L sided weakness. Patient presents with L sided weakness now improving. EMS stated LKW was 2300 however patient states his LKW is 2100. He states the weakness is improving and is much better now.  He denies taking any blood thinners. Denies any history of prior stroke.  States he took a CBD gummy tonight and states he has felt left sided weakness and numbness when taking this in the past.    Past Medical History:      Hypertension      Diabetes Mellitus      Hyperlipidemia  Coronary Artery Disease [place] PMH:  CAD s/p stents  Medications:  No Anticoagulant use  No Antiplatelet use Reviewed EMR for current medications  Allergies:  Reviewed  Social History: Smoking: Yes Drug Use: Yes  Family History:  There is no family history of premature cerebrovascular disease pertinent to this consultation  ROS : 14 Points Review of Systems was performed and was negative except mentioned in HPI.  Past Surgical History: There Is No Surgical History Contributory To Today's Visit      Examination: BP(200/81), Pulse(81), Blood Glucose(208) 1A: Level of Consciousness - Alert; keenly responsive + 0 1B: Ask Month and Age - Both Questions Right + 0 1C: Blink Eyes & Squeeze Hands - Performs Both Tasks + 0 2: Test Horizontal Extraocular Movements - Normal + 0 3: Test Visual Fields - No Visual Loss + 0 4: Test Facial Palsy (Use Grimace if Obtunded) - Normal symmetry + 0 5A: Test Left Arm Motor Drift - Drift, but doesn't hit bed + 1 5B: Test Right Arm Motor Drift - No Drift for 10 Seconds + 0 6A: Test Left Leg Motor Drift - No Drift for 5 Seconds + 0 6B: Test Right Leg Motor Drift - No Drift for 5 Seconds + 0 7: Test Limb Ataxia (FNF/Heel-Shin) - Ataxia in 1 Limb + 1 8: Test Sensation - Normal; No sensory loss + 0 9: Test Language/Aphasia - Normal; No aphasia + 0 10: Test Dysarthria - Normal + 0 11: Test Extinction/Inattention - No abnormality + 0  NIHSS Score: 2   Pre-Morbid Modified Rankin Scale: 0 Points = No symptoms at all  Spoke with : Dr. Judd Lien  This consult was conducted in real time using interactive audio and Immunologist. Patient was informed of the technology being used for this visit and agreed to proceed. Patient located in hospital and provider located at home/office setting.   Patient is being evaluated for possible acute neurologic impairment and high probability of imminent or life-threatening deterioration. I spent total of 45 minutes providing care to this patient, including time for face to face visit via telemedicine, review of medical records, imaging studies and discussion of findings with providers, the patient and/or family.   Dr Hughie Closs   [facility] For Inpatient follow-up with [facility] physician please call [facility] [phone]. This is not an outpatient service. Post hospital discharge, please contact hospital directly.  Please do not communicate with [facility] physicians via secure chat. If you have any  questions, Please contact [facility]. Please call or reconsult our service if there are any clinical or diagnostic changes.

## 2023-06-21 NOTE — Progress Notes (Signed)
Patient seen and evaluated, chart reviewed, please see EMR for updated orders. Please see full H&P dictated by admitting physician Dr Antionette Char for same date of service.   Brief Summary:-  77 y.o. male with medical history significant for pretension, hyperlipidemia, type 2 diabetes mellitus, CAD, and hypothyroidism who presents to the emergency department for evaluation of slurred speech and left-sided weakness admitted on 06/21/23 with acute and subacute strokes    A/p -Acute on subacute strokes with Dysarthria and  left-sided weakness  -MRI brain and MRA head showed scattered small acute to early subacute infarcts in the right cerebral hemisphere involving the basal ganglia and frontal, parietal, and occipital cortex in the MCA and MCA/PCA watershed distributions.  Multiple additional small remote infarcts and background chronic small-vessel ischemic change as above. -Carotid artery Doppler without hemodynamically significant stenosis -LDL 165, HDL 33 A1C 8.8 --Echo and Neuro consult requested -Evaluated by physical therapy recommended CIR -Give aspirin/Plavix combo -Give atorvastatin   2. CAD  - No anginal symptoms    -Aspirin, Plavix and atorvastatin as above   3. Hypertension  - Permissive HTN to 220/120 for now     4. Insulin-dependent DM  -A1c 8.8 reflecting uncontrolled DM with hyperglycemia PTA - -Continue insulin regimen Use Novolog/Humalog Sliding scale insulin with Accu-Cheks/Fingersticks as ordered    5. CKD 3A  - Appears close to baseline  - Renally-dose medications, monitor    6. Hypothyroidism  - Synthroid    -- Plan of care discussed with patient and his wife at bedside, questions answered  -Total care time 52 minutes  Shon Hale, MD

## 2023-06-21 NOTE — Progress Notes (Signed)
   06/21/23 0945  Vitals  Temp 97.8 F (36.6 C)  Temp Source Oral  BP (!) 168/89  MAP (mmHg) 113  BP Location Left Arm  BP Method Automatic  Patient Position (if appropriate) Sitting  Pulse Rate 67  Level of Consciousness  Level of Consciousness Alert  MEWS COLOR  MEWS Score Color Green  Oxygen Therapy  SpO2 99 %  O2 Device Room Air  Pain Assessment  Pain Scale 0-10  Pain Score 0  MEWS Score  MEWS Temp 0  MEWS Systolic 0  MEWS Pulse 0  MEWS RR 0  MEWS LOC 0  MEWS Score 0  Provider Notification  Provider Name/Title [facility]  Date Provider Notified 06/21/23  Time Provider Notified [phone]  Notification Reason Fall  Date of Provider Response 06/21/23  Time of Provider Response 1000   Pt with left sided weakness attempted to stand up out of recliner after physical therapy was completed. Attempted to stand with assistance from wife who was at bedside and slid onto floor. Pt was informed to use call light and not to get up without help from staff prior to fall. Education reinforced. Vital signs obtained and patient helped back into bed. MD notified.

## 2023-06-21 NOTE — Progress Notes (Addendum)
0030 - Code Stroke Activated  mRS 0. LKWT originally 2300 then changed to 2100 0031 - Dr. Judd Lien at bedside assessing [phone] - Patient to CT  0042 -  Tele-Neurologist paged [phone] - Dr. Bufford Buttner on stroke cart  [phone] - CT head results shared with Dr. Bufford Buttner on camera [phone] - Patient returned to room

## 2023-06-21 NOTE — Plan of Care (Signed)
  Problem: Acute Rehab PT Goals(only PT should resolve) Goal: Pt Will Go Supine/Side To Sit Outcome: Progressing Flowsheets (Taken 06/21/2023 1149) Pt will go Supine/Side to Sit:  with minimal assist  with moderate assist Goal: Patient Will Transfer Sit To/From Stand Outcome: Progressing Flowsheets (Taken 06/21/2023 1149) Patient will transfer sit to/from stand:  with minimal assist  with moderate assist Goal: Pt Will Transfer Bed To Chair/Chair To Bed Outcome: Progressing Flowsheets (Taken 06/21/2023 1149) Pt will Transfer Bed to Chair/Chair to Bed: with mod assist Goal: Pt Will Ambulate Outcome: Progressing Flowsheets (Taken 06/21/2023 1149) Pt will Ambulate:  25 feet  with moderate assist   11:51 AM, 06/21/23 Ocie Bob, MPT Physical Therapist with [facility] 336 [phone] office [phone] mobile phone

## 2023-06-21 NOTE — Progress Notes (Signed)
Received call from Tele stating patient had a change in his QRS. Dr. Marisa Severin made aware of change.

## 2023-06-21 NOTE — Progress Notes (Addendum)
Inpatient Rehabilitation Admissions Coordinator   Rehab consult received. I await further medical workup and Imaging to determine medical necessity for a possible CIR admit.   Ottie Glazier, RN, MSN Rehab Admissions Coordinator [phone] 06/21/2023 1:27 PM

## 2023-06-22 ENCOUNTER — Observation Stay (HOSPITAL_COMMUNITY): Payer: HMO

## 2023-06-22 DIAGNOSIS — Z951 Presence of aortocoronary bypass graft: Secondary | ICD-10-CM | POA: Diagnosis not present

## 2023-06-22 DIAGNOSIS — F17211 Nicotine dependence, cigarettes, in remission: Secondary | ICD-10-CM | POA: Diagnosis not present

## 2023-06-22 DIAGNOSIS — I129 Hypertensive chronic kidney disease with stage 1 through stage 4 chronic kidney disease, or unspecified chronic kidney disease: Secondary | ICD-10-CM | POA: Diagnosis not present

## 2023-06-22 DIAGNOSIS — Z79899 Other long term (current) drug therapy: Secondary | ICD-10-CM | POA: Diagnosis not present

## 2023-06-22 DIAGNOSIS — G8194 Hemiplegia, unspecified affecting left nondominant side: Secondary | ICD-10-CM | POA: Diagnosis not present

## 2023-06-22 DIAGNOSIS — I6389 Other cerebral infarction: Secondary | ICD-10-CM

## 2023-06-22 DIAGNOSIS — E119 Type 2 diabetes mellitus without complications: Secondary | ICD-10-CM | POA: Diagnosis not present

## 2023-06-22 DIAGNOSIS — E785 Hyperlipidemia, unspecified: Secondary | ICD-10-CM | POA: Diagnosis not present

## 2023-06-22 DIAGNOSIS — G8929 Other chronic pain: Secondary | ICD-10-CM | POA: Diagnosis not present

## 2023-06-22 DIAGNOSIS — Z87442 Personal history of urinary calculi: Secondary | ICD-10-CM | POA: Diagnosis not present

## 2023-06-22 DIAGNOSIS — E1122 Type 2 diabetes mellitus with diabetic chronic kidney disease: Secondary | ICD-10-CM | POA: Diagnosis not present

## 2023-06-22 DIAGNOSIS — I639 Cerebral infarction, unspecified: Secondary | ICD-10-CM

## 2023-06-22 DIAGNOSIS — R471 Dysarthria and anarthria: Secondary | ICD-10-CM | POA: Diagnosis not present

## 2023-06-22 DIAGNOSIS — Z8249 Family history of ischemic heart disease and other diseases of the circulatory system: Secondary | ICD-10-CM | POA: Diagnosis not present

## 2023-06-22 DIAGNOSIS — I2510 Atherosclerotic heart disease of native coronary artery without angina pectoris: Secondary | ICD-10-CM | POA: Diagnosis not present

## 2023-06-22 DIAGNOSIS — R4189 Other symptoms and signs involving cognitive functions and awareness: Secondary | ICD-10-CM | POA: Diagnosis not present

## 2023-06-22 DIAGNOSIS — I25110 Atherosclerotic heart disease of native coronary artery with unstable angina pectoris: Secondary | ICD-10-CM | POA: Diagnosis not present

## 2023-06-22 DIAGNOSIS — F17220 Nicotine dependence, chewing tobacco, uncomplicated: Secondary | ICD-10-CM | POA: Diagnosis not present

## 2023-06-22 DIAGNOSIS — R29818 Other symptoms and signs involving the nervous system: Secondary | ICD-10-CM | POA: Diagnosis not present

## 2023-06-22 DIAGNOSIS — R109 Unspecified abdominal pain: Secondary | ICD-10-CM | POA: Diagnosis not present

## 2023-06-22 DIAGNOSIS — R6510 Systemic inflammatory response syndrome (SIRS) of non-infectious origin without acute organ dysfunction: Secondary | ICD-10-CM | POA: Diagnosis not present

## 2023-06-22 DIAGNOSIS — I69354 Hemiplegia and hemiparesis following cerebral infarction affecting left non-dominant side: Secondary | ICD-10-CM | POA: Diagnosis not present

## 2023-06-22 DIAGNOSIS — Z85828 Personal history of other malignant neoplasm of skin: Secondary | ICD-10-CM | POA: Diagnosis not present

## 2023-06-22 DIAGNOSIS — Z79890 Hormone replacement therapy (postmenopausal): Secondary | ICD-10-CM | POA: Diagnosis not present

## 2023-06-22 DIAGNOSIS — Z794 Long term (current) use of insulin: Secondary | ICD-10-CM | POA: Diagnosis not present

## 2023-06-22 DIAGNOSIS — Z8673 Personal history of transient ischemic attack (TIA), and cerebral infarction without residual deficits: Secondary | ICD-10-CM | POA: Diagnosis not present

## 2023-06-22 DIAGNOSIS — Z7902 Long term (current) use of antithrombotics/antiplatelets: Secondary | ICD-10-CM | POA: Diagnosis not present

## 2023-06-22 DIAGNOSIS — Z7982 Long term (current) use of aspirin: Secondary | ICD-10-CM | POA: Diagnosis not present

## 2023-06-22 DIAGNOSIS — I10 Essential (primary) hypertension: Secondary | ICD-10-CM | POA: Diagnosis not present

## 2023-06-22 DIAGNOSIS — I69359 Hemiplegia and hemiparesis following cerebral infarction affecting unspecified side: Secondary | ICD-10-CM | POA: Diagnosis not present

## 2023-06-22 DIAGNOSIS — D72828 Other elevated white blood cell count: Secondary | ICD-10-CM | POA: Diagnosis not present

## 2023-06-22 DIAGNOSIS — Z880 Allergy status to penicillin: Secondary | ICD-10-CM | POA: Diagnosis not present

## 2023-06-22 DIAGNOSIS — K5900 Constipation, unspecified: Secondary | ICD-10-CM | POA: Diagnosis not present

## 2023-06-22 DIAGNOSIS — E872 Acidosis, unspecified: Secondary | ICD-10-CM | POA: Diagnosis not present

## 2023-06-22 DIAGNOSIS — R531 Weakness: Secondary | ICD-10-CM | POA: Diagnosis not present

## 2023-06-22 DIAGNOSIS — E039 Hypothyroidism, unspecified: Secondary | ICD-10-CM | POA: Diagnosis not present

## 2023-06-22 DIAGNOSIS — G9340 Encephalopathy, unspecified: Secondary | ICD-10-CM | POA: Diagnosis not present

## 2023-06-22 DIAGNOSIS — E782 Mixed hyperlipidemia: Secondary | ICD-10-CM | POA: Diagnosis not present

## 2023-06-22 DIAGNOSIS — Z87891 Personal history of nicotine dependence: Secondary | ICD-10-CM | POA: Diagnosis not present

## 2023-06-22 DIAGNOSIS — R29702 NIHSS score 2: Secondary | ICD-10-CM | POA: Diagnosis not present

## 2023-06-22 DIAGNOSIS — N1831 Chronic kidney disease, stage 3a: Secondary | ICD-10-CM | POA: Diagnosis not present

## 2023-06-22 DIAGNOSIS — K819 Cholecystitis, unspecified: Secondary | ICD-10-CM | POA: Diagnosis not present

## 2023-06-22 DIAGNOSIS — Z955 Presence of coronary angioplasty implant and graft: Secondary | ICD-10-CM | POA: Diagnosis not present

## 2023-06-22 DIAGNOSIS — Z7984 Long term (current) use of oral hypoglycemic drugs: Secondary | ICD-10-CM | POA: Diagnosis not present

## 2023-06-22 DIAGNOSIS — E1165 Type 2 diabetes mellitus with hyperglycemia: Secondary | ICD-10-CM | POA: Diagnosis not present

## 2023-06-22 DIAGNOSIS — K5901 Slow transit constipation: Secondary | ICD-10-CM | POA: Diagnosis not present

## 2023-06-22 LAB — GLUCOSE, CAPILLARY
Glucose-Capillary: 76 mg/dL (ref 70–99)
Glucose-Capillary: 186 mg/dL — ABNORMAL HIGH (ref 70–99)
Glucose-Capillary: 161 mg/dL — ABNORMAL HIGH (ref 70–99)
Glucose-Capillary: 182 mg/dL — ABNORMAL HIGH (ref 70–99)

## 2023-06-22 MED ORDER — CLOPIDOGREL BISULFATE 75 MG PO TABS
75.0000 mg | ORAL_TABLET | Freq: Every day | ORAL | Status: DC
  Administered 2023-06-23: 75 mg via ORAL
  Filled 2023-06-22: qty 1

## 2023-06-22 MED ORDER — MELATONIN 3 MG PO TABS
3.0000 mg | ORAL_TABLET | Freq: Every evening | ORAL | Status: DC | PRN
  Administered 2023-06-22 (×2): 3 mg via ORAL
  Filled 2023-06-22 (×2): qty 1

## 2023-06-22 NOTE — Consult Note (Signed)
Patient chart reviewed to assess appropriateness for inpatient rehab.  Patient is a 77 year old male presenting to the hospital status post right MCA and PCA/MCA watershed infarcts with left-sided hemiparesis and speech difficulty.  His hospitalization has been medically complicated by uncontrolled diabetes, hypertension with permissive highs for perfusion, EKG changes, and CKD stage 3.  No available documentation on bowel or bladder function other than subjective denial of loss of function.  Patient has been participating with PT and OT, with significant functional deficits in all domains, a change from baseline which was independent without assistive devices.  He has a supportive spouse with 24/7 care giving available, lives in single level home with 3 steps to enter.  Per chart review, he meets medical necessity for inpatient rehab, will required skilled interventions and PT, OT, and SLP, and has excellent family support with potential return home at anticipated contact-guard to supervision level with assistive device.  Anticipate 2-week rehab stay.   [name], DO 06/22/2023

## 2023-06-22 NOTE — Progress Notes (Signed)
Physical Therapy Treatment Patient Details Name: Hunter Sparks MRN: [id number] DOB: Dec 09, 1945 Today's Date: 06/22/2023   History of Present Illness Hunter Sparks is a pleasant 77 y.o. male with medical history significant for pretension, hyperlipidemia, type 2 diabetes mellitus, CAD, and hypothyroidism who presents to the emergency department for evaluation of slurred speech and left-sided weakness.     Patient reports he was in his usual state and had an uneventful day when he got into bed shortly before 11 PM.  Few minutes later, he was having trouble lifting his left arm.  His wife noticed that he was slurring his words and was unable to lift his left arm or his left leg.  Symptoms began improving prior to arrival in the ED and patient reports feeling back to his usual self by time of admission.    PT Comments  Patient demonstrates increased endurance/distance for taking steps followed with wheelchair for safety, has difficulty advancing LLE due to decreased ankle dorsiflexion, weakness requiring verbal/tactile cueing for completing steps with LLE.  Patient demonstrates fair return for completing BLE ROM/strengthening exercises while seated at bedside, but had frequent posterior and left lateral leaning requiring tactile cueing to prevent loss of sitting balance. Patient tolerated 2 trials of ambulation up 15 feet each and limited mostly due to frequent buckling of LLE and fatigue.  Patient tolerated sitting up in chair with spouse present after therapy.  Patient will benefit from continued skilled physical therapy in hospital and recommended venue below to increase strength, balance, endurance for safe ADLs and gait.      If plan is discharge home, recommend the following: A lot of help with bathing/dressing/bathroom;A lot of help with walking and/or transfers;Help with stairs or ramp for entrance;Assistance with cooking/housework   Can travel by private [employer] walker (2 wheels)    Recommendations for Other Services       Precautions / Restrictions Precautions Precautions: Fall Restrictions Weight Bearing Restrictions: No     Mobility  Bed Mobility Overal bed mobility: Needs Assistance Bed Mobility: Supine to Sit     Supine to sit: Min assist, Mod assist     General bed mobility comments: slow labored movement with limited use of LUE    Transfers Overall transfer level: Needs assistance Equipment used: Rolling walker (2 wheels) Transfers: Sit to/from Stand, Bed to chair/wheelchair/BSC Sit to Stand: Mod assist   Step pivot transfers: Mod assist       General transfer comment: increased time, labored movement    Ambulation/Gait Ambulation/Gait assistance: Mod assist, Max assist Gait Distance (Feet): 15 Feet Assistive device: Rolling walker (2 wheels) Gait Pattern/deviations: Decreased step length - right, Decreased step length - left, Decreased stride length, Knees buckling, Ataxic, Decreased stance time - left, Steppage Gait velocity: slow     General Gait Details: increased endurance/distance for taking steps with slow labored cadence, decreased left dorsiflexion, diffiuclty advancing LLE due to weakness requiring occasional tactile assistance to move LLE   Stairs             Wheelchair Mobility     Tilt Bed    Modified Rankin (Stroke Patients Only)       Balance Overall balance assessment: Needs assistance Sitting-balance support: Feet supported, No upper extremity supported Sitting balance-Leahy Scale: Poor Sitting balance - Comments: fair/poor seated at EOB Postural control: Posterior lean, Left lateral lean Standing balance support: Reliant on assistive device for balance, During functional  activity, Bilateral upper extremity supported Standing balance-Leahy Scale: Poor Standing balance comment: using RW due to frequent buckling of LLE                             Cognition Arousal: Alert Behavior During Therapy: WFL for tasks assessed/performed Overall Cognitive Status: Within Functional Limits for tasks assessed                                 General Comments: slightly drowsy due to restless over night        Exercises General Exercises - Lower Extremity Long Arc Quad: Seated, AROM, Strengthening, Both, 10 reps Hip Flexion/Marching: Seated, AROM, Strengthening, Both, 10 reps Toe Raises: Seated, AROM, Strengthening, Both, 10 reps Heel Raises: Seated, AROM, Strengthening, Both, 10 reps    General Comments        Pertinent Vitals/Pain Pain Assessment Pain Assessment: No/denies pain    Home Living                          Prior Function            PT Goals (current goals can now be found in the care plan section) Acute Rehab PT Goals Patient Stated Goal: return  home after rehab PT Goal Formulation: With patient/family Time For Goal Achievement: 07/05/23 Potential to Achieve Goals: Good Progress towards PT goals: Progressing toward goals    Frequency    Min 4X/week      PT Plan      Co-evaluation              AM-PAC PT "6 Clicks" Mobility   Outcome Measure  Help needed turning from your back to your side while in a flat bed without using bedrails?: A Little Help needed moving from lying on your back to sitting on the side of a flat bed without using bedrails?: A Lot Help needed moving to and from a bed to a chair (including a wheelchair)?: A Lot Help needed standing up from a chair using your arms (e.g., wheelchair or bedside chair)?: A Lot Help needed to walk in hospital room?: A Lot Help needed climbing 3-5 steps with a railing? : Total 6 Click Score: 12    End of Session   Activity Tolerance: Patient tolerated treatment well;Patient limited by fatigue Patient left: in chair;with call bell/phone within reach;with family/visitor present Nurse Communication: Mobility  status PT Visit Diagnosis: Unsteadiness on feet (R26.81);Other abnormalities of gait and mobility (R26.89);Muscle weakness (generalized) (M62.81)     Time: 7425-9563 PT Time Calculation (min) (ACUTE ONLY): 38 min  Charges:    $Gait Training: 8-22 mins $Therapeutic Exercise: 8-22 mins PT General Charges $$ ACUTE PT VISIT: 1 Visit                     12:35 PM, 06/22/23 Ocie Bob, MPT Physical Therapist with [facility] [address] [phone] office [phone] mobile phone

## 2023-06-22 NOTE — Progress Notes (Signed)
PROGRESS NOTE  Hunter Sparks, is a 77 y.o. male, DOB - Mar 16, 1946, WUJ:[id number]  Admit date - 06/21/2023   Admitting Physician  Mariea Clonts, MD  Outpatient Primary MD for the patient is Benita Stabile, MD  LOS - 0  Chief Complaint  Patient presents with   Code Stroke       Brief Summary:-  77 y.o. male with medical history significant for pretension, hyperlipidemia, type 2 diabetes mellitus, CAD, and hypothyroidism who presents to the emergency department for evaluation of slurred speech and left-sided weakness admitted on 06/21/23 with acute and subacute strokes -Patient is medically stable for transfer to CIR when bed is available and insurance authorization can be obtained      A/p -Acute on subacute strokes with Dysarthria and  left-sided weakness  -MRI brain and MRA head showed scattered small acute to early subacute infarcts in the right cerebral hemisphere involving the basal ganglia and frontal, parietal, and occipital cortex in the MCA and MCA/PCA watershed distributions.  Multiple additional small remote infarcts and background chronic small-vessel ischemic change as above. -Carotid artery Doppler without hemodynamically significant stenosis -LDL 165, HDL 33 A1C 8.8 --Echo with EF of 70 to 35%,, grade 1 diastolic dysfunction,, no stenosis,, no mitral stenosis,, agitated saline contrast bubble study negative for any intra-atrial shunt -Neuro consult appreciated -Recommend aspirin/Plavix  combo for 3 weeks and then aspirin monotherapy after that --Evaluated by physical therapy recommended CIR -c/n  atorvastatin   2. CAD  - No anginal symptoms    -Aspirin, Plavix and atorvastatin as above   3. Hypertension  - Permissive HTN to 220/120 for now     4. Insulin-dependent DM  -A1c 8.8 reflecting uncontrolled DM with hyperglycemia PTA - -Continue insulin regimen Use Novolog/Humalog Sliding scale insulin with Accu-Cheks/Fingersticks as ordered    5. CKD 3A  - Appears  close to baseline  - Renally-dose medications, monitor    6. Hypothyroidism  - Synthroid    Status is: Inpatient   Disposition: The patient is from: Home              Anticipated d/c is to: CIR              Anticipated d/c date is: 1 day              Patient currently is medically stable to d/c. Barriers: -Patient is medically stable for transfer to CIR when bed is available and insurance authorization can be obtained  Code Status :  -  Code Status: Full Code   Family Communication--- (patient is alert, awake and coherent)  Discussed with wife at bedside  DVT Prophylaxis  :   - SCDs   enoxaparin (LOVENOX) injection 40 mg Start: 06/21/23 1000   Lab Results  Component Value Date   PLT 167 06/21/2023    Inpatient Medications  Scheduled Meds:   stroke: early stages of recovery book   Does not apply Once   aspirin EC  81 mg Oral Daily   atorvastatin  40 mg Oral Daily   [START ON 06/23/2023] clopidogrel  75 mg Oral Daily   enoxaparin (LOVENOX) injection  40 mg Subcutaneous Q24H   insulin aspart  0-5 Units Subcutaneous QHS   insulin aspart  0-6 Units Subcutaneous TID WC   insulin glargine-yfgn  45 Units Subcutaneous QHS   levothyroxine  75 mcg Oral Q0600   LORazepam  1 mg Oral QHS   sodium chloride flush  3 mL Intravenous Once   Continuous  Infusions: PRN Meds:.acetaminophen **OR** acetaminophen (TYLENOL) oral liquid 160 mg/5 mL **OR** acetaminophen, melatonin, senna-docusate   Anti-infectives (From admission, onward)    None         Subjective: Jahziah Siclari today has no fevers, no emesis,  No chest pain,    -Wife at bedside, questions answered-  Patient is medically stable for transfer to CIR when bed is available and insurance authorization can be obtained -  Objective: Vitals:   06/22/23 1445 06/22/23 1600 06/22/23 1800 06/22/23 2000  BP: (!) 141/77  (!) 160/80 (!) 168/92  Pulse:    86  Resp:  14 16 20   Temp: 97.8 F (36.6 C)  98.4 F (36.9 C) 98 F  (36.7 C)  TempSrc: Oral  Oral Oral  SpO2: 97%  99% 97%  Weight:      Height:        Intake/Output Summary (Last 24 hours) at 06/22/2023 2029 Last data filed at 06/22/2023 0745 Gross per 24 hour  Intake --  Output 950 ml  Net -950 ml   Filed Weights   06/21/23 0057  Weight: 98 kg    Physical Exam  Gen:- Awake Alert,  in no apparent distress  HEENT:- [place].AT, No sclera icterus Neck-Supple Neck,No JVD,.  Lungs-  CTAB , fair symmetrical air movement CV- S1, S2 normal, regular  Abd-  +ve B.Sounds, Abd Soft, No tenderness,    Extremity/Skin:- No  edema, pedal pulses present  Psych-affect is appropriate, oriented x3 Neuro-left-sided weakness persist ,  no tremors  Data Reviewed: I have personally reviewed following labs and imaging studies  CBC: Recent Labs  Lab 06/21/23 0041 06/21/23 0046 06/21/23 0320  WBC 8.3  --  9.3  NEUTROABS 3.7  --   --   HGB 14.4 14.6 13.6  HCT 42.8 43.0 40.1  MCV 93.0  --  93.3  PLT 180  --  167   Basic Metabolic Panel: Recent Labs  Lab 06/21/23 0041 06/21/23 0046 06/21/23 0320  NA 137 140 141  K 3.8 3.9 3.6  CL 102 104 103  CO2 25  --  28  GLUCOSE 199* 196* 125*  BUN 16 18 16   CREATININE 1.37* 1.50* 1.38*  CALCIUM 9.0  --  8.9   GFR: Estimated Creatinine Clearance: 53.5 mL/min (A) (by C-G formula based on SCr of 1.38 mg/dL (H)). Liver Function Tests: Recent Labs  Lab 06/21/23 0041  AST 19  ALT 14  ALKPHOS 59  BILITOT 0.6  PROT 7.1  ALBUMIN 3.8   HbA1C: Recent Labs    06/21/23 0041  HGBA1C 8.8*   Radiology Studies: ECHOCARDIOGRAM COMPLETE BUBBLE STUDY  Result Date: 06/22/2023    ECHOCARDIOGRAM REPORT   Patient Name:   ABDIHAFID Sparks Date of Exam: 06/22/2023 Medical Rec #:  [id number]        Height:       72.0 in Accession #:    [id number]       Weight:       216.0 lb Date of Birth:  May 11, 1946        BSA:          2.201 m Patient Age:    76 years         BP:           163/80 mmHg Patient Gender: M                HR:            86 bpm. Exam  [facility] Procedure: 2D Echo, Cardiac Doppler, Color Doppler and Saline Contrast Bubble            Study Indications:    Stroke 434.91 / I63.9  History:        Patient has prior history of Echocardiogram examinations, most                 recent 06/12/2015. CAD, Signs/Symptoms:Altered Mental Status;                 Risk Factors:Hypertension, Diabetes, Former Smoker and                 Dyslipidemia.  Sonographer:    Aron Baba Referring Phys: [id number] [facility]  Sonographer Comments: Suboptimal parasternal window and suboptimal subcostal window. Image acquisition challenging due to uncooperative patient and Image acquisition challenging due to respiratory motion. IMPRESSIONS  1. Left ventricular ejection fraction, by estimation, is 70 to 75%. The left ventricle has hyperdynamic function. The left ventricle has no regional wall motion abnormalities. Left ventricular diastolic parameters are consistent with Grade I diastolic dysfunction (impaired relaxation).  2. Right ventricular systolic function was not well visualized. The right ventricular size is normal.  3. The mitral valve is grossly normal. No evidence of mitral valve regurgitation. No evidence of mitral stenosis.  4. The aortic valve was not well visualized. Aortic valve regurgitation is not visualized. Aortic valve sclerosis/calcification is present, without any evidence of aortic stenosis.  5. Agitated saline contrast bubble study was negative, with no evidence of any interatrial shunt. Comparison(s): No prior Echocardiogram. FINDINGS  Left Ventricle: Left ventricular ejection fraction, by estimation, is 70 to 75%. The left ventricle has hyperdynamic function. The left ventricle has no regional wall motion abnormalities. The left ventricular internal cavity size was normal in size. There is no left ventricular hypertrophy. Left ventricular diastolic parameters are consistent with Grade I diastolic dysfunction (impaired  relaxation). Right Ventricle: The right ventricular size is normal. No increase in right ventricular wall thickness. Right ventricular systolic function was not well visualized. Left Atrium: Left atrial size was normal in size. Right Atrium: Right atrial size was normal in size. Pericardium: There is no evidence of pericardial effusion. Mitral Valve: The mitral valve is grossly normal. There is mild thickening of the mitral valve leaflet(s). There is moderate calcification of the mitral valve leaflet(s). No evidence of mitral valve regurgitation. No evidence of mitral valve stenosis. Tricuspid Valve: The tricuspid valve is grossly normal. Tricuspid valve regurgitation is trivial. No evidence of tricuspid stenosis. Aortic Valve: The aortic valve was not well visualized. Aortic valve regurgitation is not visualized. Aortic valve sclerosis/calcification is present, without any evidence of aortic stenosis. Pulmonic Valve: The pulmonic valve was grossly normal. Pulmonic valve regurgitation is not visualized. No evidence of pulmonic stenosis. Aorta: The aortic root and ascending aorta are structurally normal, with no evidence of dilitation. Venous: The inferior vena cava was not well visualized. IAS/Shunts: The interatrial septum was not well visualized. Agitated saline contrast was given intravenously to evaluate for intracardiac shunting. Agitated saline contrast bubble study was negative, with no evidence of any interatrial shunt.  LEFT VENTRICLE PLAX 2D LVIDd:         4.50 cm   Diastology LVIDs:         2.60 cm   LV e' medial:    6.42 cm/s LV PW:         1.00 cm   LV E/e' medial:  12.9 LV IVS:  0.50 cm   LV e' lateral:   7.40 cm/s LVOT diam:     1.70 cm   LV E/e' lateral: 11.2 LV SV:         40 LV SV Index:   18 LVOT Area:     2.27 cm  RIGHT VENTRICLE RV S prime:     9.57 cm/s TAPSE (M-mode): 0.8 cm LEFT ATRIUM             Index        RIGHT ATRIUM           Index LA Vol (A2C):   34.6 ml 15.72 ml/m  RA Area:      10.90 cm LA Vol (A4C):   48.6 ml 22.08 ml/m  RA Volume:   19.20 ml  8.72 ml/m LA Biplane Vol: 41.0 ml 18.62 ml/m  AORTIC VALVE LVOT Vmax:   94.60 cm/s LVOT Vmean:  62.900 cm/s LVOT VTI:    0.176 m  AORTA Ao Root diam: 3.40 cm Ao Asc diam:  3.30 cm MITRAL VALVE                TRICUSPID VALVE MV Area (PHT): 3.30 cm     TR Peak grad:   7.5 mmHg MV Decel Time: 230 msec     TR Vmax:        137.00 cm/s MR Peak grad: 2.8 mmHg MR Vmax:      83.80 cm/s    SHUNTS MV E velocity: 82.60 cm/s   Systemic VTI:  0.18 m MV A velocity: 120.00 cm/s  Systemic Diam: 1.70 cm MV E/A ratio:  0.69 Vishnu Priya Mallipeddi Electronically signed by Winfield Rast Mallipeddi Signature Date/Time: 06/22/2023/4:43:03 PM    Final    MR BRAIN WO CONTRAST  Result Date: 06/21/2023 CLINICAL DATA:  Stroke suspected EXAM: MRI HEAD WITHOUT CONTRAST MRA HEAD WITHOUT CONTRAST TECHNIQUE: Multiplanar, multi-echo pulse sequences of the brain and surrounding structures were acquired without intravenous contrast. Angiographic images of the Circle of Willis were acquired using MRA technique without intravenous contrast. COMPARISON:  Same-day CT head FINDINGS: MRI HEAD FINDINGS Brain: There are small acute infarcts in the right lentiform nucleus and caudate tail, a small acute cortical infarct in the right occipital lobe, and punctate acute cortical infarcts in the frontal and parietal lobes. There is no hemorrhage or mass effect. There is no acute intracranial hemorrhage or extra-axial fluid collection There is background parenchymal volume loss with prominence of the ventricular system and extra-axial CSF spaces. Patchy FLAIR signal abnormality in the supratentorial white matter likely reflects sequela of moderate underlying chronic small-vessel ischemic change. There are additional remote lacunar infarcts in the right basal ganglia, pons, left corona radiata and bilateral cerebellar hemispheres. The pituitary and suprasellar region are normal. There is no  mass lesion. There is no mass effect or midline shift. Vascular: See below. Skull and upper cervical spine: Normal marrow signal. Sinuses/Orbits: The paranasal sinuses are clear. The globes and orbits are unremarkable. Other: The mastoid air cells and middle ear cavities are clear. MRA HEAD FINDINGS Anterior circulation: There is mild atherosclerotic irregularity of the intracranial ICAs without high-grade stenosis or occlusion. The bilateral MCAs are patent. There is focal moderate stenosis of the origin of the inferior M2 branch (1009-5). The bilateral ACAS are patent, without proximal high-grade stenosis or occlusion. There is no aneurysm or AVM. Posterior circulation: The left V4 segment is occluded, presumed chronic. The right V4 segment is patent. There is multifocal atherosclerotic irregularity of  the basilar artery with moderate stenosis proximally. The PCAs are primarily supplied by prominent posterior communicating arteries bilaterally (fetal origins). There is moderate stenosis of the proximal left P2 segment (12-94), and moderate stenosis of the distal right posterior communicating artery. There is no aneurysm or AVM. Anatomic variants: As above. IMPRESSION: 1. 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 2. Multiple additional small remote infarcts and background chronic small-vessel ischemic change as above. 3. 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 Electronically Signed   By: Lesia Hausen M.D.   On: 06/21/2023 15:11   MR ANGIO HEAD WO CONTRAST  Result Date: 06/21/2023 CLINICAL DATA:  Stroke suspected EXAM: MRI HEAD WITHOUT  CONTRAST MRA HEAD WITHOUT CONTRAST TECHNIQUE: Multiplanar, multi-echo pulse sequences of the brain and surrounding structures were acquired without intravenous contrast. Angiographic images of the Circle of Willis were acquired using MRA technique without intravenous contrast. COMPARISON:  Same-day CT head FINDINGS: MRI HEAD FINDINGS Brain: There are small acute infarcts in the right lentiform nucleus and caudate tail, a small acute cortical infarct in the right occipital lobe, and punctate acute cortical infarcts in the frontal and parietal lobes. There is no hemorrhage or mass effect. There is no acute intracranial hemorrhage or extra-axial fluid collection There is background parenchymal volume loss with prominence of the ventricular system and extra-axial CSF spaces. Patchy FLAIR signal abnormality in the supratentorial white matter likely reflects sequela of moderate underlying chronic small-vessel ischemic change. There are additional remote lacunar infarcts in the right basal ganglia, pons, left corona radiata and bilateral cerebellar hemispheres. The pituitary and suprasellar region are normal. There is no mass lesion. There is no mass effect or midline shift. Vascular: See below. Skull and upper cervical spine: Normal marrow signal. Sinuses/Orbits: The paranasal sinuses are clear. The globes and orbits are unremarkable. Other: The mastoid air cells and middle ear cavities are clear. MRA HEAD FINDINGS Anterior circulation: There is mild atherosclerotic irregularity of the intracranial ICAs without high-grade stenosis or occlusion. The bilateral MCAs are patent. There is focal moderate stenosis of the origin of the inferior M2 branch (1009-5). The bilateral ACAS are patent, without proximal high-grade stenosis or occlusion. There is no aneurysm or AVM. Posterior circulation: The left V4 segment is occluded, presumed chronic. The right V4 segment is patent. There is multifocal atherosclerotic irregularity of  the basilar artery with moderate stenosis proximally. The PCAs are primarily supplied by prominent posterior communicating arteries bilaterally (fetal origins). There is moderate stenosis of the proximal left P2 segment (12-94), and moderate stenosis of the distal right posterior communicating artery. There is no aneurysm or AVM. Anatomic variants: As above. IMPRESSION: 1. 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 2. Multiple additional small remote infarcts and background chronic small-vessel ischemic change as above. 3. 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 Electronically Signed   By: Lesia Hausen M.D.   On: 06/21/2023 15:11  US Carotid Bilateral  Result Date: 06/21/2023 CLINICAL DATA:  Left-sided weakness and history of hypertension, hyperlipidemia, diabetes, coronary artery disease and prior smoking history. EXAM: BILATERAL CAROTID DUPLEX ULTRASOUND TECHNIQUE: Wallace Cullens scale imaging, color Doppler and duplex ultrasound were performed of bilateral carotid and vertebral arteries in the neck. COMPARISON:  None Available. FINDINGS: Criteria: Quantification of carotid stenosis is based on velocity parameters that correlate the residual internal carotid diameter with NASCET-based stenosis levels, using the diameter of the distal internal carotid lumen as the denominator for stenosis measurement. The following velocity measurements were obtained: RIGHT ICA:  92/8 cm/sec CCA:  106/8 cm/sec SYSTOLIC ICA/CCA RATIO:  0.9 ECA:  131 cm/sec LEFT ICA:  114/22 cm/sec CCA:  111/17 cm/sec SYSTOLIC ICA/CCA RATIO:  1.0 ECA:  88 cm/sec RIGHT CAROTID ARTERY:  Minimal plaque at the right ICA origin. Estimated right ICA stenosis is less than 50%. RIGHT VERTEBRAL ARTERY: Antegrade flow with normal waveform and velocity. LEFT CAROTID ARTERY: Mild to moderate partially calcified plaque at the level of the carotid bulb extending into the proximal left ICA. Estimated left ICA stenosis is less than 50%. LEFT VERTEBRAL ARTERY: Antegrade flow with normal waveform and velocity. IMPRESSION: 1. Mild to moderate partially calcified plaque at the left carotid bulb extending into the proximal left ICA. Estimated left ICA stenosis is less than 50%. 2. Minimal plaque at the right ICA origin. Estimated right ICA stenosis is less than 50%. Electronically Signed   By: Irish Lack M.D.   On: 06/21/2023 12:15   CT HEAD CODE STROKE WO CONTRAST  Result Date: 06/21/2023 CLINICAL DATA:  Code stroke.  Acute neurologic deficit EXAM: CT HEAD WITHOUT CONTRAST TECHNIQUE: Contiguous axial images were obtained from the base of the skull through the vertex without intravenous contrast. RADIATION DOSE REDUCTION: This exam was performed according to the departmental dose-optimization program which includes automated exposure control, adjustment of the mA and/or kV according to patient size and/or use of iterative reconstruction technique. COMPARISON:  None Available. FINDINGS: Brain: There is no mass, hemorrhage or extra-axial collection. There is generalized atrophy without lobar predilection. Old bilateral cerebellar infarcts. There is periventricular hypoattenuation compatible with chronic microvascular disease. Vascular: No abnormal hyperdensity of the major intracranial arteries or dural venous sinuses. No intracranial atherosclerosis. Skull: The visualized skull base, calvarium and extracranial soft tissues are normal. Sinuses/Orbits: No fluid levels or advanced mucosal thickening of the visualized paranasal sinuses. No mastoid or middle ear effusion. The orbits are normal. ASPECTS [facility]  Stroke Program Early CT Score) - Ganglionic level infarction (caudate, lentiform nuclei, internal capsule, insula, M1-M3 cortex): 7 - Supraganglionic infarction (M4-M6 cortex): 3 Total score (0-10 with 10 being normal): 10 IMPRESSION: 1. No acute intracranial abnormality. 2. ASPECTS is 10. These results were called by telephone at the time of interpretation on 06/21/2023 at 12:47 am to provider Geoffery Lyons , who verbally acknowledged these results. Electronically Signed   By: Deatra Robinson M.D.   On: 06/21/2023 00:47    Scheduled Meds:   stroke: early stages of recovery book   Does not apply Once   aspirin EC  81 mg Oral Daily   atorvastatin  40 mg Oral Daily   [START ON 06/23/2023] clopidogrel  75 mg Oral Daily   enoxaparin (LOVENOX) injection  40 mg Subcutaneous Q24H   insulin aspart  0-5 Units Subcutaneous QHS   insulin aspart  0-6 Units Subcutaneous TID WC   insulin glargine-yfgn  45 Units Subcutaneous QHS   levothyroxine  75 mcg Oral Q0600  LORazepam  1 mg Oral QHS   sodium chloride flush  3 mL Intravenous Once   Continuous Infusions:   LOS: 0 days   Shon Hale M.D on 06/22/2023 at 8:29 PM  Go to [email or web address] - for contact info  [facility] - Office  [phone]  If 7PM-7AM, please contact night-coverage [email or web address] 06/22/2023, 8:29 PM

## 2023-06-22 NOTE — Progress Notes (Signed)
Bubble study preparations made.

## 2023-06-22 NOTE — TOC Progression Note (Signed)
Transition of Care [facility]) - Progression Note    Patient Details  Name: Hunter Sparks MRN: [id number] Date of Birth: 1946-02-07  [facility] CM/SW Contact  Leitha Bleak, RN Phone Number: 06/22/2023, 11:17 AM  Clinical Narrative:   TOC following up with CIR. Discussion was pending due to work up was not complete. They will begin discussion and start INS AUTH today if patient is agreeable with CIR. Admission will be next week. Team updated. TOC following.     Expected Discharge Plan: IP Rehab Facility Barriers to Discharge: Insurance Authorization  Expected Discharge Plan and Services      Social Determinants of Health (SDOH) Interventions SDOH Screenings   Tobacco Use: High Risk (06/21/2023)    Readmission Risk Interventions     No data to display

## 2023-06-22 NOTE — Consult Note (Addendum)
I connected with  Hunter Sparks on 06/22/23 by a video enabled telemedicine application and verified that I am speaking with the correct person using two identifiers.   I discussed the limitations of evaluation and management by telemedicine. The patient expressed understanding and agreed to proceed. Location of patient: [facility] Location of physician: [facility]   Neurology Consultation Reason for Consult: stroke Referring Physician: Dr Shon Hale  CC: stroke  History is obtained from: patient, wife, chart review  HPI: Hunter Sparks is a 77 y.o. male with past medical history of hypertension, hyperlipidemia, coronary disease, type 2 diabetes who presented with left-sided weakness.  Patient states he went to lay down and noticed that he had trouble turning in bed due to weakness on the left side (arm and leg).  Denies any similar episodes in the past.  Denies taking antiplatelet/anticoagulants.  Last known normal: 06/21/2023 at 2100 Event happened at home No tPA as outside window, symptoms improving No thrombectomy as no large vessel occlusion mRS 0   ROS: All other systems reviewed and negative except as noted in the HPI.   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HCC)    Arthritis    "back, hips, legs" (06/10/2015)   Chest pain    Chronic lower back pain    Depression    DJD (degenerative joint disease)    Hyperlipidemia 06/11/2015   Hypertension    Hypothyroidism    Kidney stone    Kidney stone    Type II diabetes mellitus (HCC)      Family History  Problem Relation Age of Onset   Stroke Mother    Hypertension Mother    Stomach cancer Father    Liver cancer Father    Cancer Father    Rectal cancer Brother    Cancer Brother    Heart attack Neg Hx     Social History:  reports that he quit smoking about 19 years ago. His smoking use included cigarettes. He started smoking about 64 years ago. He has a 90 pack-year smoking  history. His smokeless tobacco use includes chew. He reports that he does not drink alcohol and does not use drugs.   Medications Prior to Admission  Medication Sig Dispense Refill Last Dose   insulin glargine, 2 Unit Dial, (TOUJEO MAX SOLOSTAR) 300 UNIT/ML Solostar Pen Inject 70 Units into the skin at bedtime.   06/20/2023   levothyroxine (SYNTHROID) 75 MCG tablet TAKE 1 TABLET BY MOUTH ONCE DAILY BEFORE BREAKFAST 90 tablet 0 06/20/2023   losartan (COZAAR) 25 MG tablet Take 1 tablet (25 mg total) by mouth daily. 90 tablet 0 06/20/2023   metoprolol tartrate (LOPRESSOR) 25 MG tablet Take 1 tablet (25 mg total) by mouth 2 (two) times daily. 180 tablet 0 06/20/2023   traMADol (ULTRAM) 50 MG tablet Take 1 tablet (50 mg total) by mouth every 6 (six) hours as needed. pain 30 tablet 0 06/20/2023   blood glucose meter kit and supplies 1 each by Other route 4 (four) times daily. Dispense based on patient and insurance preference. Use up to four times daily as directed. (FOR ICD-10 E10.9, E11.9). 1 each 0    glucose blood (ONETOUCH VERIO) test strip 1 each by Other route 4 (four) times daily. Use as instructed 150 each 5    Insulin Pen Needle (B-D ULTRAFINE III SHORT PEN) 31G X 8 MM MISC 1 each by Does not apply route as directed. 100 each 3  Exam: Current vital signs: BP (!) 163/80 (BP Location: Left Arm)   Pulse 87   Temp 97.8 F (36.6 C) (Oral)   Resp 18   Ht 6' (1.829 m)   Wt 98 kg   SpO2 95%   BMI 29.29 kg/m  Vital signs in last 24 hours: Temp:  [97.8 F (36.6 C)-98.8 F (37.1 C)] 97.8 F (36.6 C) (08/09 0646) Pulse Rate:  [65-91] 87 (08/09 0740) Resp:  [14-20] 18 (08/09 0740) BP: (129-182)/(55-101) 163/80 (08/09 0740) SpO2:  [95 %-99 %] 95 % (08/09 0740)   Physical Exam  Constitutional: Appears well-developed and well-nourished.  Psych: Affect appropriate to situation Neuro: AAO x 3, no aphasia, no dysarthria, cranial nerves II through open grossly intact except left facial droop,  antigravity strength in all 4 extremities with drift in left upper and left lower extremity (does not touch the bed), FTN intact bilaterally, sensory intact to light touch  NIHSS 3  INPUTS: 1A: Level of consciousness --> 0 = Alert; keenly responsive 1B: Ask month and age --> 0 = Both questions right 1C: 'Blink eyes' & 'squeeze hands' --> 0 = Performs both tasks 2: Horizontal extraocular movements --> 0 = Normal 3: Visual fields --> 0 = No visual loss 4: Facial palsy --> 1 = Minor paralysis (flat nasolabial fold, smile asymmetry) 5A: Left arm motor drift --> 1 = Drift, but doesn't hit bed 5B: Right arm motor drift --> 0 = No drift for 10 seconds 6A: Left leg motor drift --> 1 = Drift, but doesn't hit bed 6B: Right leg motor drift --> 0 = No drift for 5 seconds 7: Limb Ataxia --> 0 = No ataxia 8: Sensation --> 0 = Normal; no sensory loss 9: Language/aphasia --> 0 = Normal; no aphasia 10: Dysarthria --> 0 = Normal 11: Extinction/inattention --> 0 = No abnormality   I have reviewed labs in epic and the results pertinent to this consultation are: CBC:  Recent Labs  Lab 06/21/23 0041 06/21/23 0046 06/21/23 0320  WBC 8.3  --  9.3  NEUTROABS 3.7  --   --   HGB 14.4 14.6 13.6  HCT 42.8 43.0 40.1  MCV 93.0  --  93.3  PLT 180  --  167    Basic Metabolic Panel:  Lab Results  Component Value Date   NA 141 06/21/2023   K 3.6 06/21/2023   CO2 28 06/21/2023   GLUCOSE 125 (H) 06/21/2023   BUN 16 06/21/2023   CREATININE 1.38 (H) 06/21/2023   CALCIUM 8.9 06/21/2023   GFRNONAA 52 (L) 06/21/2023   GFRAA 67 10/20/2019   Lipid Panel:  Lab Results  Component Value Date   LDLCALC 165 (H) 06/21/2023   HgbA1c:  Lab Results  Component Value Date   HGBA1C 8.8 (H) 06/21/2023   Urine Drug Screen: No results found for: "LABOPIA", "COCAINSCRNUR", "LABBENZ", "AMPHETMU", "THCU", "LABBARB"  Alcohol Level     Component Value Date/Time   ETH <10 06/21/2023 0041     I have reviewed the  images obtained:  CT Head without contrast 06/21/2023: No acute intracranial abnormality. ASPECTS is 10.  MRI Brain wo contrast 06/21/2023: Scattered small acute to early subacute infarcts in the right cerebral hemisphere involving the basal ganglia and frontal, parietal, and occipital cortex in the MCA and MCA/PCA watershed distributions. Multiple additional small remote infarcts and background chronic small-vessel ischemic change as above.  MRA head wo contrast 06/21/2023: Intracranial atherosclerotic disease as above with mild irregularity of the intracranial  ICAs, occluded left V4 segment which is favored chronic, moderate stenosis of the basilar artery, moderate proximal right P2 stenosis, and moderate bilateral PCA stenosis.  Carotid US 06/21/2023: . Mild to moderate partially calcified plaque at the left carotid bulb extending into the proximal left ICA. Estimated left ICA stenosis is less than 50%. Minimal plaque at the right ICA origin. Estimated right ICA stenosis is less than 50%.    ASSESSMENT/PLAN: 77 year old male presented with acute onset left-sided weakness.  MRI brain showed multiple infarcts in right hemisphere, largest 1 in right basal ganglia.  Acute ischemic stroke -Etiology: Likely due to hypoperfusion  Recommendations: -Recommend aspirin 81 mg daily and Plavix 75 mg daily for 3 weeks followed by aspirin 81 mg daily -Recommend atorvastatin 40 mg daily -TTE ordered and pending.  If negative recommend cardiac monitor -Goal blood pressure: Normotension, avoid hypotension -Modification of stroke risk factors -PT/OT -Stroke education including BEFAST -Follow-up with neurology 8 to 12 weeks (order placed) -Discussed plan with patient and wife at bedside, medicine team by secure chat   Thank you for allowing [place] to participate in the care of this patient. If you have any further questions, please contact  me or neurohospitalist.   Lindie Spruce [facility]  neurohospitalist

## 2023-06-22 NOTE — Progress Notes (Addendum)
  Inpatient Rehabilitation Admissions Coordinator   I spoke with patient's wife, [name], by phone at pt's bedside. She states husband is HOH and unable to talk on phone. We discussed goals and expectations of a possible CIR admit. Patient was active and independent pta. They prefer CIR for rehab. Family can provide expected caregiver support that is recommended of supervision level. I will begin insurance Auth with [employer] for possible CIR admit pending approval. Dr Shearon Stalls has reviewed his case for candidacy for Cir.  Patient also has secondary coverage with [employer] policy # [id number] and I will contact [facility] admitting to have this added to his account. I reviewed estimated cost of care with wife for [employer] coverage for CIR. Please call me with any questions.   Ottie Glazier, RN, MSN Rehab Admissions Coordinator [phone]

## 2023-06-22 NOTE — Progress Notes (Signed)
  Echocardiogram 2D Echocardiogram has been performed.  Maren Reamer 06/22/2023, 9:45 AM

## 2023-06-22 NOTE — Care Management Obs Status (Signed)
MEDICARE OBSERVATION STATUS NOTIFICATION   Patient Details  Name: Hunter Sparks MRN: [id number] Date of Birth: [birth year]   Medicare Observation Status Notification Given:  Yes    Corey Harold 06/22/2023, 1:04 PM

## 2023-06-22 NOTE — Progress Notes (Signed)
Inpatient Rehabilitation Admissions Coordinator   I have received insurance approval for CIR admit at [facility]. We await bed availability to admit over next few days. Weekend Admission Coordinator will follow up with acute team.  Ottie Glazier, RN, MSN Rehab Admissions Coordinator [phone] 06/22/2023 5:18 PM

## 2023-06-22 NOTE — PMR Pre-admission (Signed)
PMR Admission Coordinator Pre-Admission Assessment  Patient: Hunter Sparks is an 77 y.o., male MRN: [id number] DOB: Sep 30, 1946 Height: 6' (182.9 cm) Weight: 98 kg  Insurance Information HMO: yes    PPO:      PCP:      IPA:      80/20:      OTHER:  PRIMARY: [employer]      Policy#: [id number]      Subscriber: pt CM Name: Judeth Cornfield      Phone#: [phone]     Fax#: [phone]/EMR access Pre-Cert#: [id number] approved for 7 days      Employer:  Benefits:  Phone #: [phone]     Name: 8/9 Eff. Date: 11/13/22     Deduct: none      Out of Pocket Max: $3500 CIR: $225 co pay per day days 1 until 6      SNF: no co pay per day days 1 until 20; $203 co pay per day days 21 until 100 Outpatient: $15 per visit     Co-Pay: visits per medical neccesity Home Health: 80%      Co-Pay: 20% DME: 80%     Co-Pay: 20% Providers: in network  SECONDARY: [employer]      Policy#: [id number]       Financial Counselor:       Phone#:   The "Data Collection Information Summary" for patients in Inpatient Rehabilitation Facilities with attached "Privacy Act Statement-Health Care Records" was provided and verbally reviewed with: Family  Emergency Contact Information Contact Information     Name Relation Home Work [place]   [phone]      Other Contacts     Name Relation Home Work Mobile   [place] Son   [phone]   [place], [place]   [phone]      Current Medical History  Patient Admitting Diagnosis: CVA  History of Present Illness: 77 year old male with history of HTN, HLD, type 2 DM, CKD 3 a, CAD and hypothyroidism. Presented to [facility] on 06/21/23 due to slurred speech and left sided weakness.   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 Neurology consulted. Recommend ASA and Plavix for 3 weeks followed by ASA alone. Recommend atorvastatin for LDL 165 and HDL 33. Carotid dopplers without significant stenosis. To allow  permissive HTN. Uncontrolled DM with Hgb A1c 8.8. Use SSI and monitor CBGS. Renal dose meds and monitor for history of CKD 3 a.Synthroid for hypothyroidism.    Complete NIHSS TOTAL: 2  Patient's medical record from [facility] has been reviewed by the rehabilitation admission coordinator and physician.  Past Medical History  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HCC)    Arthritis    "back, hips, legs" (06/10/2015)   Chest pain    Chronic lower back pain    Depression    DJD (degenerative joint disease)    Hyperlipidemia 06/11/2015   Hypertension    Hypothyroidism    Kidney stone    Kidney stone    Type II diabetes mellitus (HCC)    Has the patient had major surgery during 100 days prior to admission? No  Family History   family history includes Cancer in his brother and father; Hypertension in his mother; Liver cancer in his father; Rectal cancer in his brother; Stomach cancer in his father; Stroke in his mother.  Current Medications  Current Facility-Administered Medications:     stroke: early stages of recovery book, ,  Does not apply, Once, Opyd, Lavone Neri, MD   acetaminophen (TYLENOL) tablet 650 mg, 650 mg, Oral, Q4H PRN **OR** acetaminophen (TYLENOL) 160 MG/5ML solution 650 mg, 650 mg, Per Tube, Q4H PRN **OR** acetaminophen (TYLENOL) suppository 650 mg, 650 mg, Rectal, Q4H PRN, Opyd, Lavone Neri, MD   aspirin EC tablet 81 mg, 81 mg, Oral, Daily, Opyd, Lavone Neri, MD, 81 mg at 06/22/23 0943   atorvastatin (LIPITOR) tablet 40 mg, 40 mg, Oral, Daily, Emokpae, Courage, MD, 40 mg at 06/22/23 0943   [START ON 06/23/2023] clopidogrel (PLAVIX) tablet 75 mg, 75 mg, Oral, Daily, Yadav, Priyanka O, MD   enoxaparin (LOVENOX) injection 40 mg, 40 mg, Subcutaneous, Q24H, Opyd, Timothy S, MD, 40 mg at 06/22/23 0944   insulin aspart (novoLOG) injection 0-5 Units, 0-5 Units, Subcutaneous, QHS, Opyd, Timothy S, MD   insulin aspart (novoLOG) injection 0-6 Units, 0-6 Units, Subcutaneous,  TID WC, Opyd, Lavone Neri, MD, 1 Units at 06/22/23 1316   insulin glargine-yfgn (SEMGLEE) injection 45 Units, 45 Units, Subcutaneous, QHS, Opyd, Lavone Neri, MD, 45 Units at 06/21/23 2216   levothyroxine (SYNTHROID) tablet 75 mcg, 75 mcg, Oral, Q0600, Opyd, Lavone Neri, MD, 75 mcg at 06/22/23 0626   LORazepam (ATIVAN) tablet 1 mg, 1 mg, Oral, QHS, Emokpae, Courage, MD, 1 mg at 06/21/23 2212   melatonin tablet 3 mg, 3 mg, Oral, QHS PRN, Opyd, Lavone Neri, MD, 3 mg at 06/22/23 0014   senna-docusate (Senokot-S) tablet 1 tablet, 1 tablet, Oral, QHS PRN, Opyd, Timothy S, MD   sodium chloride flush (NS) 0.9 % injection 3 mL, 3 mL, Intravenous, Once, Opyd, Lavone Neri, MD  Patients Current Diet:  Diet Order             Diet Heart Room service appropriate? Yes; Fluid consistency: Thin  Diet effective now                  Precautions / Restrictions Precautions Precautions: Fall Restrictions Weight Bearing Restrictions: No   Has the patient had 2 or more falls or a fall with injury in the past year? No  Prior Activity Level Community (5-7x/wk): independent, active and driving, retired  Prior Functional Level Self Care: Did the patient need help bathing, dressing, using the toilet or eating? Independent  Indoor Mobility: Did the patient need assistance with walking from room to room (with or without device)? Independent  Stairs: Did the patient need assistance with internal or external stairs (with or without device)? Independent  Functional Cognition: Did the patient need help planning regular tasks such as shopping or remembering to take medications? Independent  Patient Information Are you of Hispanic, Latino/a,or Spanish origin?: A. No, not of Hispanic, Latino/a, or Spanish origin What is your race?: A. White Do you need or want an interpreter to communicate with a doctor or health care staff?: 0. No  Patient's Response To:  Health Literacy and Transportation Is the patient able to  respond to health literacy and transportation needs?: Yes Health Literacy - How often do you need to have someone help you when you read instructions, pamphlets, or other written material from your doctor or pharmacy?: Never In the past 12 months, has lack of transportation kept you from medical appointments or from getting medications?: No In the past 12 months, has lack of transportation kept you from meetings, work, or from getting things needed for daily living?: No  Home Assistive Devices / Equipment Home Equipment: None  Prior Device Use: Indicate devices/aids used by the  patient prior to current illness, exacerbation or injury? None of the above  Current Functional Level Cognition  Overall Cognitive Status: Within Functional Limits for tasks assessed General Comments: slightly drowsy due to restless over night    Extremity Assessment (includes Sensation/Coordination)  Upper Extremity Assessment: Defer to OT evaluation LUE Deficits / Details: 4-/5  for shoulder abduction and flexion; WNL otherwise for strength. Mild gross motor coordination deficts for finger to nose test. LUE Sensation: WNL LUE Coordination: decreased gross motor  Lower Extremity Assessment: Generalized weakness, LLE deficits/detail LLE Deficits / Details: grossly 3+/5 LLE Sensation: decreased light touch, decreased proprioception LLE Coordination: decreased gross motor    ADLs  Overall ADL's : Needs assistance/impaired Eating/Feeding: Sitting, Independent Grooming: Set up, Sitting Upper Body Bathing: Set up, Sitting Lower Body Bathing: Contact guard assist, Minimal assistance, Sitting/lateral leans Upper Body Dressing : Set up, Sitting Lower Body Dressing: Contact guard assist, Minimal assistance, Sitting/lateral leans Lower Body Dressing Details (indicate cue type and reason): Much extended time and guarding assist to don socks seated at EOB. Toilet Transfer: Maximal assistance, Rolling walker (2 wheels),  Tax adviser Details (indicate cue type and reason): Simulated via EOB to chair transfer Toileting- Clothing Manipulation and Hygiene: Minimal assistance, Contact guard assist, Sitting/lateral lean Tub/ Shower Transfer: Maximal assistance, Rolling walker (2 wheels), Stand-pivot Functional mobility during ADLs: Maximal assistance, Rolling walker (2 wheels)    Mobility  Overal bed mobility: Needs Assistance Bed Mobility: Supine to Sit Supine to sit: Min assist, Mod assist General bed mobility comments: slow labored movement with limited use of LUE    Transfers  Overall transfer level: Needs assistance Equipment used: Rolling walker (2 wheels) Transfers: Sit to/from Stand, Bed to chair/wheelchair/BSC Sit to Stand: Mod assist Bed to/from chair/wheelchair/BSC transfer type:: Step pivot Step pivot transfers: Mod assist General transfer comment: increased time, labored movement    Ambulation / Gait / Stairs / Wheelchair Mobility  Ambulation/Gait Ambulation/Gait assistance: Mod assist, Max assist Gait Distance (Feet): 15 Feet Assistive device: Rolling walker (2 wheels) Gait Pattern/deviations: Decreased step length - right, Decreased step length - left, Decreased stride length, Knees buckling, Ataxic, Decreased stance time - left, Steppage General Gait Details: increased endurance/distance for taking steps with slow labored cadence, decreased left dorsiflexion, diffiuclty advancing LLE due to weakness requiring occasional tactile assistance to move LLE Gait velocity: slow    Posture / Balance Dynamic Sitting Balance Sitting balance - Comments: fair/poor seated at EOB Balance Overall balance assessment: Needs assistance Sitting-balance support: Feet supported, No upper extremity supported Sitting balance-Leahy Scale: Poor Sitting balance - Comments: fair/poor seated at EOB Postural control: Posterior lean, Left lateral lean Standing balance support: Reliant on assistive  device for balance, During functional activity, Bilateral upper extremity supported Standing balance-Leahy Scale: Poor Standing balance comment: using RW due to frequent buckling of LLE    Special needs/care consideration Hgb A1c 8.8   Previous Home Environment  Living Arrangements: Spouse/significant other  Lives With: Spouse Available Help at Discharge: Family, Available 24 hours/day Type of Home: House Home Layout: One level Home Access: Stairs to enter Entrance Stairs-Rails: None Entrance Stairs-Number of Steps: 3 Bathroom Shower/Tub: Engineer, manufacturing systems: Handicapped height Bathroom Accessibility: Yes How Accessible: Accessible via walker Home Care Services: No  Discharge Living Setting Plans for Discharge Living Setting: Patient's home, Lives with (comment) (spouse) Type of Home at Discharge: House Discharge Home Layout: One level Discharge Home Access: Stairs to enter Entrance Stairs-Rails: None Entrance Stairs-Number of Steps: 3 Discharge Bathroom Shower/Tub:  Tub/shower unit Discharge Bathroom Toilet: Handicapped height Discharge Bathroom Accessibility: Yes How Accessible: Accessible via walker Does the patient have any problems obtaining your medications?: No  Social/Family/Support Systems Patient Roles: Spouse Contact Information: wife, [name] Anticipated Caregiver: wife Anticipated Caregiver's Contact Information: [phone] Ability/Limitations of Caregiver: none Caregiver Availability: 24/7 Discharge Plan Discussed with Primary Caregiver: Yes Is Caregiver In Agreement with Plan?: Yes Does Caregiver/Family have Issues with Lodging/Transportation while Pt is in Rehab?: No  Goals Patient/Family Goal for Rehab: supervision with PT and OT Expected length of stay: ELOS 10 to 14 days Additional Information: Patient is HOH Pt/Family Agrees to Admission and willing to participate: Yes Program Orientation Provided & Reviewed with Pt/Caregiver Including  Roles  & Responsibilities: Yes  Decrease burden of Care through IP rehab admission: n/a  Possible need for SNF placement upon discharge: not anticipated  Patient Condition: I have reviewed medical records from [facility], spoken with spouse. I discussed via phone for inpatient rehabilitation assessment.  Patient will benefit from ongoing PT and OT, can actively participate in 3 hours of therapy a day 5 days of the week, and can make measurable gains during the admission.  Patient will also benefit from the coordinated team approach during an Inpatient Acute Rehabilitation admission.  The patient will receive intensive therapy as well as Rehabilitation physician, nursing, social worker, and care management interventions.  Due to bladder management, bowel management, safety, skin/wound care, disease management, medication administration, pain management, and patient education the patient requires 24 hour a day rehabilitation nursing.  The patient is currently Mod-Max A with mobility and Contact Guard-Min A with basic ADLs.  Discharge setting and therapy post discharge at home with home health is anticipated.  Patient has agreed to participate in the Acute Inpatient Rehabilitation Program and will admit today.  Preadmission Screen Completed By:  Clois Dupes RN MSN  06/22/2023 4:05 PM ______________________________________________________________________   Discussed status with Dr. Berline Chough on 06/23/23  at 9:29 AM and received approval for admission today.  Admission Coordinator:  Clois Dupes, RN MSN time 9:29 AM/Date 06/23/23    Assessment/Plan: Diagnosis: scattered R cerebral strokes Does the need for close, 24 hr/day Medical supervision in concert with the patient's rehab needs make it unreasonable for this patient to be served in a less intensive setting? Yes Co-Morbidities requiring supervision/potential complications: DM- A1c 8.8 on insulin; HTN, HLD, CAD, hypothyroidism; CKD3A, L  hemi Due to bladder management, bowel management, safety, skin/wound care, disease management, medication administration, pain management, and patient education, does the patient require 24 hr/day rehab nursing? Yes Does the patient require coordinated care of a physician, rehab nurse, PT, OT, and SLP to address physical and functional deficits in the context of the above medical diagnosis(es)? Yes Addressing deficits in the following areas: balance, endurance, locomotion, strength, transferring, bowel/bladder control, bathing, dressing, feeding, grooming, toileting, and speech Can the patient actively participate in an intensive therapy program of at least 3 hrs of therapy 5 days a week? Yes The potential for patient to make measurable gains while on inpatient rehab is good Anticipated functional outcomes upon discharge from inpatient rehab: supervision PT, supervision OT, supervision SLP Estimated rehab length of stay to reach the above functional goals is: 10-14 days Anticipated discharge destination: Home 10. Overall Rehab/Functional Prognosis: good   MD Signature:

## 2023-06-23 ENCOUNTER — Encounter (HOSPITAL_COMMUNITY): Payer: Self-pay | Admitting: Physical Medicine and Rehabilitation

## 2023-06-23 ENCOUNTER — Other Ambulatory Visit: Payer: Self-pay

## 2023-06-23 ENCOUNTER — Inpatient Hospital Stay (HOSPITAL_COMMUNITY)
Admission: RE | Admit: 2023-06-23 | Discharge: 2023-07-02 | DRG: 057 | Disposition: A | Discharge status: Other Acute Inpatient Hospital | Payer: HMO | Source: Other Acute Inpatient Hospital | Attending: Physical Medicine and Rehabilitation | Admitting: Physical Medicine and Rehabilitation

## 2023-06-23 DIAGNOSIS — I25110 Atherosclerotic heart disease of native coronary artery with unstable angina pectoris: Secondary | ICD-10-CM | POA: Diagnosis present

## 2023-06-23 DIAGNOSIS — Z951 Presence of aortocoronary bypass graft: Secondary | ICD-10-CM

## 2023-06-23 DIAGNOSIS — E1165 Type 2 diabetes mellitus with hyperglycemia: Secondary | ICD-10-CM | POA: Diagnosis not present

## 2023-06-23 DIAGNOSIS — G9340 Encephalopathy, unspecified: Secondary | ICD-10-CM | POA: Diagnosis not present

## 2023-06-23 DIAGNOSIS — Z515 Encounter for palliative care: Secondary | ICD-10-CM | POA: Diagnosis not present

## 2023-06-23 DIAGNOSIS — Z823 Family history of stroke: Secondary | ICD-10-CM

## 2023-06-23 DIAGNOSIS — G319 Degenerative disease of nervous system, unspecified: Secondary | ICD-10-CM | POA: Diagnosis not present

## 2023-06-23 DIAGNOSIS — I10 Essential (primary) hypertension: Secondary | ICD-10-CM | POA: Diagnosis present

## 2023-06-23 DIAGNOSIS — N1831 Chronic kidney disease, stage 3a: Secondary | ICD-10-CM | POA: Diagnosis not present

## 2023-06-23 DIAGNOSIS — F17211 Nicotine dependence, cigarettes, in remission: Secondary | ICD-10-CM

## 2023-06-23 DIAGNOSIS — Z7982 Long term (current) use of aspirin: Secondary | ICD-10-CM | POA: Diagnosis not present

## 2023-06-23 DIAGNOSIS — R0600 Dyspnea, unspecified: Secondary | ICD-10-CM | POA: Diagnosis not present

## 2023-06-23 DIAGNOSIS — G8929 Other chronic pain: Secondary | ICD-10-CM | POA: Diagnosis not present

## 2023-06-23 DIAGNOSIS — Z794 Long term (current) use of insulin: Secondary | ICD-10-CM | POA: Diagnosis not present

## 2023-06-23 DIAGNOSIS — K5900 Constipation, unspecified: Secondary | ICD-10-CM | POA: Diagnosis present

## 2023-06-23 DIAGNOSIS — K819 Cholecystitis, unspecified: Secondary | ICD-10-CM | POA: Diagnosis not present

## 2023-06-23 DIAGNOSIS — R109 Unspecified abdominal pain: Secondary | ICD-10-CM | POA: Diagnosis not present

## 2023-06-23 DIAGNOSIS — I129 Hypertensive chronic kidney disease with stage 1 through stage 4 chronic kidney disease, or unspecified chronic kidney disease: Secondary | ICD-10-CM | POA: Diagnosis present

## 2023-06-23 DIAGNOSIS — I2510 Atherosclerotic heart disease of native coronary artery without angina pectoris: Secondary | ICD-10-CM | POA: Diagnosis present

## 2023-06-23 DIAGNOSIS — E039 Hypothyroidism, unspecified: Secondary | ICD-10-CM | POA: Diagnosis present

## 2023-06-23 DIAGNOSIS — E119 Type 2 diabetes mellitus without complications: Secondary | ICD-10-CM

## 2023-06-23 DIAGNOSIS — Z85828 Personal history of other malignant neoplasm of skin: Secondary | ICD-10-CM

## 2023-06-23 DIAGNOSIS — K5901 Slow transit constipation: Secondary | ICD-10-CM | POA: Diagnosis not present

## 2023-06-23 DIAGNOSIS — Z7189 Other specified counseling: Secondary | ICD-10-CM | POA: Diagnosis not present

## 2023-06-23 DIAGNOSIS — R4182 Altered mental status, unspecified: Secondary | ICD-10-CM | POA: Diagnosis not present

## 2023-06-23 DIAGNOSIS — R6510 Systemic inflammatory response syndrome (SIRS) of non-infectious origin without acute organ dysfunction: Secondary | ICD-10-CM | POA: Diagnosis not present

## 2023-06-23 DIAGNOSIS — E782 Mixed hyperlipidemia: Secondary | ICD-10-CM | POA: Diagnosis present

## 2023-06-23 DIAGNOSIS — I639 Cerebral infarction, unspecified: Secondary | ICD-10-CM | POA: Diagnosis not present

## 2023-06-23 DIAGNOSIS — R6520 Severe sepsis without septic shock: Secondary | ICD-10-CM | POA: Diagnosis not present

## 2023-06-23 DIAGNOSIS — Z79890 Hormone replacement therapy (postmenopausal): Secondary | ICD-10-CM

## 2023-06-23 DIAGNOSIS — I69354 Hemiplegia and hemiparesis following cerebral infarction affecting left non-dominant side: Principal | ICD-10-CM

## 2023-06-23 DIAGNOSIS — Z79899 Other long term (current) drug therapy: Secondary | ICD-10-CM

## 2023-06-23 DIAGNOSIS — K429 Umbilical hernia without obstruction or gangrene: Secondary | ICD-10-CM | POA: Diagnosis present

## 2023-06-23 DIAGNOSIS — N401 Enlarged prostate with lower urinary tract symptoms: Secondary | ICD-10-CM | POA: Diagnosis present

## 2023-06-23 DIAGNOSIS — E785 Hyperlipidemia, unspecified: Secondary | ICD-10-CM | POA: Diagnosis not present

## 2023-06-23 DIAGNOSIS — Z8249 Family history of ischemic heart disease and other diseases of the circulatory system: Secondary | ICD-10-CM

## 2023-06-23 DIAGNOSIS — R531 Weakness: Secondary | ICD-10-CM | POA: Diagnosis present

## 2023-06-23 DIAGNOSIS — Z880 Allergy status to penicillin: Secondary | ICD-10-CM | POA: Diagnosis not present

## 2023-06-23 DIAGNOSIS — N3944 Nocturnal enuresis: Secondary | ICD-10-CM | POA: Diagnosis present

## 2023-06-23 DIAGNOSIS — E872 Acidosis, unspecified: Secondary | ICD-10-CM | POA: Diagnosis present

## 2023-06-23 DIAGNOSIS — R4189 Other symptoms and signs involving cognitive functions and awareness: Secondary | ICD-10-CM

## 2023-06-23 DIAGNOSIS — D72829 Elevated white blood cell count, unspecified: Secondary | ICD-10-CM | POA: Diagnosis not present

## 2023-06-23 DIAGNOSIS — Z7902 Long term (current) use of antithrombotics/antiplatelets: Secondary | ICD-10-CM | POA: Diagnosis not present

## 2023-06-23 DIAGNOSIS — F17220 Nicotine dependence, chewing tobacco, uncomplicated: Secondary | ICD-10-CM | POA: Diagnosis not present

## 2023-06-23 DIAGNOSIS — I69359 Hemiplegia and hemiparesis following cerebral infarction affecting unspecified side: Secondary | ICD-10-CM

## 2023-06-23 DIAGNOSIS — I6782 Cerebral ischemia: Secondary | ICD-10-CM | POA: Diagnosis not present

## 2023-06-23 DIAGNOSIS — A419 Sepsis, unspecified organism: Secondary | ICD-10-CM | POA: Diagnosis not present

## 2023-06-23 DIAGNOSIS — I6381 Other cerebral infarction due to occlusion or stenosis of small artery: Secondary | ICD-10-CM | POA: Diagnosis not present

## 2023-06-23 DIAGNOSIS — M720 Palmar fascial fibromatosis [Dupuytren]: Secondary | ICD-10-CM | POA: Diagnosis present

## 2023-06-23 DIAGNOSIS — D72828 Other elevated white blood cell count: Secondary | ICD-10-CM | POA: Diagnosis not present

## 2023-06-23 DIAGNOSIS — E1122 Type 2 diabetes mellitus with diabetic chronic kidney disease: Secondary | ICD-10-CM | POA: Diagnosis not present

## 2023-06-23 DIAGNOSIS — Z800 Family history of malignant neoplasm of digestive organs: Secondary | ICD-10-CM

## 2023-06-23 LAB — GLUCOSE, CAPILLARY
Glucose-Capillary: 68 mg/dL — ABNORMAL LOW (ref 70–99)
Glucose-Capillary: 76 mg/dL (ref 70–99)
Glucose-Capillary: 165 mg/dL — ABNORMAL HIGH (ref 70–99)
Glucose-Capillary: 212 mg/dL — ABNORMAL HIGH (ref 70–99)

## 2023-06-23 MED ORDER — ENOXAPARIN SODIUM 40 MG/0.4ML IJ SOSY
40.0000 mg | PREFILLED_SYRINGE | INTRAMUSCULAR | Status: DC
  Administered 2023-06-24 – 2023-07-01 (×8): 40 mg via SUBCUTANEOUS
  Filled 2023-06-23 (×9): qty 0.4

## 2023-06-23 MED ORDER — TRAZODONE HCL 50 MG PO TABS
100.0000 mg | ORAL_TABLET | Freq: Every day | ORAL | Status: DC
  Administered 2023-06-23 – 2023-07-01 (×9): 100 mg via ORAL
  Filled 2023-06-23 (×9): qty 2

## 2023-06-23 MED ORDER — TOUJEO MAX SOLOSTAR 300 UNIT/ML ~~LOC~~ SOPN: 42.0000 [IU] | PEN_INJECTOR | Freq: Every day | SUBCUTANEOUS | 0 refills | Status: DC

## 2023-06-23 MED ORDER — LEVOTHYROXINE SODIUM 75 MCG PO TABS
75.0000 ug | ORAL_TABLET | Freq: Every day | ORAL | Status: DC
  Administered 2023-06-24 – 2023-07-01 (×8): 75 ug via ORAL
  Filled 2023-06-23 (×8): qty 1

## 2023-06-23 MED ORDER — ATORVASTATIN CALCIUM 40 MG PO TABS
40.0000 mg | ORAL_TABLET | Freq: Every day | ORAL | Status: DC
  Administered 2023-06-24 – 2023-07-01 (×8): 40 mg via ORAL
  Filled 2023-06-23 (×4): qty 1
  Filled 2023-06-23: qty 4
  Filled 2023-06-23 (×3): qty 1

## 2023-06-23 MED ORDER — TRAZODONE HCL 100 MG PO TABS: 100.0000 mg | ORAL_TABLET | Freq: Every day | ORAL | 2 refills | Status: DC

## 2023-06-23 MED ORDER — ASPIRIN 81 MG PO TBEC
81.0000 mg | DELAYED_RELEASE_TABLET | Freq: Every day | ORAL | Status: DC
  Administered 2023-06-24 – 2023-07-01 (×8): 81 mg via ORAL
  Filled 2023-06-23 (×8): qty 1

## 2023-06-23 MED ORDER — CLOPIDOGREL BISULFATE 75 MG PO TABS: 75.0000 mg | ORAL_TABLET | Freq: Every day | ORAL | 0 refills | Status: DC

## 2023-06-23 MED ORDER — ACETAMINOPHEN 160 MG/5ML PO SOLN
650.0000 mg | ORAL | Status: DC | PRN
  Filled 2023-06-23: qty 20.3

## 2023-06-23 MED ORDER — TRAZODONE HCL 50 MG PO TABS: 100.0000 mg | ORAL_TABLET | Freq: Every day | ORAL | Status: DC

## 2023-06-23 MED ORDER — INSULIN ASPART 100 UNIT/ML IJ SOLN
0.0000 [IU] | Freq: Three times a day (TID) | INTRAMUSCULAR | Status: DC
  Administered 2023-06-23: 1 [IU] via SUBCUTANEOUS
  Administered 2023-06-24: 2 [IU] via SUBCUTANEOUS
  Administered 2023-06-24: 1 [IU] via SUBCUTANEOUS
  Administered 2023-06-25: 2 [IU] via SUBCUTANEOUS
  Administered 2023-06-26 (×3): 1 [IU] via SUBCUTANEOUS
  Administered 2023-06-27: 2 [IU] via SUBCUTANEOUS
  Administered 2023-06-27 – 2023-06-28 (×3): 1 [IU] via SUBCUTANEOUS
  Administered 2023-06-29 – 2023-06-30 (×2): 2 [IU] via SUBCUTANEOUS
  Administered 2023-06-30: 1 [IU] via SUBCUTANEOUS
  Administered 2023-07-01: 2 [IU] via SUBCUTANEOUS
  Administered 2023-07-01 – 2023-07-02 (×2): 1 [IU] via SUBCUTANEOUS
  Administered 2023-07-02: 3 [IU] via SUBCUTANEOUS
  Administered 2023-07-02: 2 [IU] via SUBCUTANEOUS

## 2023-06-23 MED ORDER — ATORVASTATIN CALCIUM 40 MG PO TABS: 40.0000 mg | ORAL_TABLET | Freq: Every day | ORAL | 4 refills | Status: DC

## 2023-06-23 MED ORDER — LEVOTHYROXINE SODIUM 75 MCG PO TABS: 75.0000 ug | ORAL_TABLET | Freq: Every day | ORAL | 0 refills | Status: AC

## 2023-06-23 MED ORDER — INSULIN ASPART 100 UNIT/ML IJ SOLN
0.0000 [IU] | Freq: Every day | INTRAMUSCULAR | Status: DC
  Administered 2023-06-23 – 2023-07-02 (×6): 2 [IU] via SUBCUTANEOUS

## 2023-06-23 MED ORDER — SENNOSIDES-DOCUSATE SODIUM 8.6-50 MG PO TABS: 2.0000 | ORAL_TABLET | Freq: Every day | ORAL | 1 refills | Status: DC

## 2023-06-23 MED ORDER — CLOPIDOGREL BISULFATE 75 MG PO TABS
75.0000 mg | ORAL_TABLET | Freq: Every day | ORAL | Status: DC
  Administered 2023-06-24 – 2023-07-01 (×8): 75 mg via ORAL
  Filled 2023-06-23 (×8): qty 1

## 2023-06-23 MED ORDER — ACETAMINOPHEN 650 MG RE SUPP: 650.0000 mg | RECTAL | Status: DC | PRN

## 2023-06-23 MED ORDER — SENNOSIDES-DOCUSATE SODIUM 8.6-50 MG PO TABS: 1.0000 | ORAL_TABLET | Freq: Every evening | ORAL | Status: DC | PRN

## 2023-06-23 MED ORDER — INSULIN ASPART 100 UNIT/ML FLEXPEN
0.0000 [IU] | PEN_INJECTOR | Freq: Three times a day (TID) | SUBCUTANEOUS | 11 refills | Status: DC
Start: 2023-06-23 — End: 2023-08-04

## 2023-06-23 MED ORDER — ASPIRIN 81 MG PO TBEC: 81.0000 mg | DELAYED_RELEASE_TABLET | Freq: Every day | ORAL | 11 refills | Status: DC

## 2023-06-23 MED ORDER — INSULIN GLARGINE-YFGN 100 UNIT/ML ~~LOC~~ SOLN
42.0000 [IU] | Freq: Every day | SUBCUTANEOUS | Status: DC
  Filled 2023-06-23: qty 0.42

## 2023-06-23 MED ORDER — ACETAMINOPHEN 325 MG PO TABS: 650.0000 mg | ORAL_TABLET | ORAL | Status: DC | PRN

## 2023-06-23 MED ORDER — INSULIN GLARGINE-YFGN 100 UNIT/ML ~~LOC~~ SOLN
42.0000 [IU] | Freq: Every day | SUBCUTANEOUS | Status: DC
  Administered 2023-06-23 – 2023-06-27 (×5): 42 [IU] via SUBCUTANEOUS
  Filled 2023-06-23 (×6): qty 0.42

## 2023-06-23 MED ORDER — TRAMADOL HCL 50 MG PO TABS
50.0000 mg | ORAL_TABLET | Freq: Every day | ORAL | Status: DC
  Administered 2023-06-23 – 2023-07-01 (×9): 50 mg via ORAL
  Filled 2023-06-23 (×10): qty 1

## 2023-06-23 MED ORDER — MELATONIN 3 MG PO TABS: 3.0000 mg | ORAL_TABLET | Freq: Every evening | ORAL | Status: DC | PRN

## 2023-06-23 NOTE — Discharge Summary (Signed)
Hunter Sparks, is a 77 y.o. male  DOB 1946/06/14  MRN [id number].  Admission date:  06/21/2023  Admitting Physician  Shon Hale, MD  Discharge Date:  06/23/2023   Primary MD  Benita Stabile, MD  Recommendations for primary care physician for things to follow:  1)Take Aspirin 81 mg daily along with Plavix 75 mg daily for 21 days then after that STOP the Plavix  and continue ONLY Aspirin 81 mg daily indefinitely--  2)Short acting insulin per sliding scale 0-10 ---:-- Insulin injection 0-10 Units 0-10 Units Subcutaneous, 3 times daily with meals CBG 70 - 120: 0 unit CBG 121 - 150: 0 unit  CBG 151 - 200: 1 unit CBG 201 - 250: 2 units CBG 251 - 300: 4 units CBG 301 - 350: 6 units  CBG 351 - 400: 8 units  CBG > 400: 10 units  3)Avoid ibuprofen/Advil/Aleve/Motrin/Goody Powders/Naproxen/BC powders/Meloxicam/Diclofenac/Indomethacin and other Nonsteroidal anti-inflammatory medications as these will make you more likely to bleed and can cause stomach ulcers, can also cause Kidney problems.   4)follow up with Neurologist in about 4 to 6 weeks for recheck  Admission Diagnosis  Left-sided weakness [R53.1] Acute stroke due to ischemia [facility]) [I63.9]  Discharge Diagnosis  Left-sided weakness [R53.1] Acute stroke due to ischemia [facility]) [I63.9]    Principal Problem:   Left-sided weakness Active Problems:   Hypothyroidism   Essential hypertension   Coronary artery disease involving native coronary artery of native heart with unstable angina pectoris (HCC)   Insulin dependent type 2 diabetes mellitus (HCC)   CKD stage 3a, GFR 45-59 ml/min (HCC)   Acute stroke due to ischemia [facility])      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HCC)    Arthritis    "back, hips, legs" (06/10/2015)   Chest pain    Chronic lower back pain    Depression    DJD (degenerative joint disease)    Hyperlipidemia 06/11/2015    Hypertension    Hypothyroidism    Kidney stone    Kidney stone    Type II diabetes mellitus (HCC)     Past Surgical History:  Procedure Laterality Date   CARDIAC CATHETERIZATION N/A 06/11/2015   Procedure: Left Heart Cath and Coronary Angiography;  Surgeon: Lyn Records, MD;  Location: [facility] INVASIVE CV LAB;  Service: Cardiovascular;  Laterality: N/A;   COLONOSCOPY  06/21/2011   Procedure: COLONOSCOPY;  Surgeon: Malissa Hippo, MD;  [facility];  Service: Endoscopy;  Laterality: N/A;   COLONOSCOPY     COLONOSCOPY N/A 09/20/2017   Procedure: COLONOSCOPY;  Surgeon: Malissa Hippo, MD;  Location: [facility];  Service: Endoscopy;  Laterality: N/A;  1200   CORONARY ARTERY BYPASS GRAFT N/A 06/14/2015   Procedure: CORONARY ARTERY BYPASS GRAFTING times four using Left Internal mammary artery and right leg Saphenous vein graft;  Surgeon: Kerin Perna, MD;  Location: [facility] OR;  Service: Open Heart Surgery;  Laterality: N/A;   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  HAND SURGERY     SKIN CANCER EXCISION Left    "forearm"   TEE WITHOUT CARDIOVERSION N/A 06/14/2015   Procedure: TRANSESOPHAGEAL ECHOCARDIOGRAM (TEE);  Surgeon: Kerin Perna, MD;  Location: [facility] OR;  Service: Open Heart Surgery;  Laterality: N/A;    HPI  from the history and physical done on the day of admission:    Patient coming from: Home   Chief Complaint: Slurred speech, left-sided weakness    HPI: Hunter Sparks is a pleasant 77 y.o. male with medical history significant for pretension, hyperlipidemia, type 2 diabetes mellitus, CAD, and hypothyroidism who presents to the emergency department for evaluation of slurred speech and left-sided weakness.   Patient reports he was in his usual state and had an uneventful day when he got into bed shortly before 11 PM.  Few minutes later, he was having trouble lifting his left arm.  His wife noticed that he was slurring his words and was unable to lift his left arm  or his left leg.  Symptoms began improving prior to arrival in the ED and patient reports feeling back to his usual self by time of admission.   ED Course: Upon arrival to the ED, patient is found to be afebrile and saturating well on room air with elevated blood pressure.  EKG demonstrates sinus rhythm.  Labs are most notable for creatinine 1.37.  Head CT is negative for acute intracranial abnormality.   Patient was evaluated by neurology in the emergency department and medical admission was recommended for further evaluation.  He passed a bedside swallow screen and was given 81 mg of aspirin.   Review of Systems:  All other systems reviewed and apart from HPI, are negative.   Hospital Course:   Assessment and Plan: Brief Summary:-  77 y.o. male with medical history significant for pretension, hyperlipidemia, type 2 diabetes mellitus, CAD, and hypothyroidism who presents to the emergency department for evaluation of slurred speech and left-sided weakness admitted on 06/21/23 with acute and subacute strokes -Patient is medically stable for transfer to CIR      A/p -Acute on subacute strokes with Dysarthria and  left-sided weakness  -MRI brain and MRA head showed scattered small acute to early subacute infarcts in the right cerebral hemisphere involving the basal ganglia and frontal, parietal, and occipital cortex in the MCA and MCA/PCA watershed distributions.  Multiple additional small remote infarcts and background chronic small-vessel ischemic change as above. -Carotid artery Doppler without hemodynamically significant stenosis -LDL 165, HDL 33 A1C 8.8 --Echo with EF of 70 to 35%,, grade 1 diastolic dysfunction,, no stenosis,, no mitral stenosis,, agitated saline contrast bubble study negative for any intra-atrial shunt -Neuro consult appreciated -Recommend aspirin/Plavix  combo for 3 weeks and then aspirin monotherapy Aspirin 81 mg daily indefinitely--for secondary stroke Prevention (Per  The multicenter SAMMPRIS trial) ---Evaluated by physical therapy recommended CIR -c/n  atorvastatin 40 mg daily   2. CAD  - No anginal symptoms    -Aspirin, Plavix and atorvastatin as above C/n Metoprolol   3. Hypertension  - we initially allowed Permissive HTN  -Restart losartan and metoprolol   4. Insulin-dependent DM  -A1c 8.8 reflecting uncontrolled DM with hyperglycemia PTA - -Continue insulin regimen Use Novolog/Humalog Sliding scale insulin with Accu-Cheks/Fingersticks as ordered    5. CKD 3A  - Appears close to baseline  - Renally-dose medications, monitor    6. Hypothyroidism  - Synthroid      Disposition: The patient is from: Home  Anticipated d/c is to: CIR  Discharge Condition: stable  Follow UP   Follow-up Information     Micki Riley, MD. Schedule an appointment as soon as possible for a visit in 1 month(s).   Specialties: Neurology, Radiology Contact information: [address] [place] [address] [phone]                 Consults obtained -neurology  Diet and Activity recommendation:  As advised  Discharge Instructions    Discharge Instructions     Ambulatory referral to Neurology   Complete by: As directed    An appointment is requested in approximately: 8 weeks   Call MD for:  difficulty breathing, headache or visual disturbances   Complete by: As directed    Call MD for:  persistant dizziness or light-headedness   Complete by: As directed    Call MD for:  persistant nausea and vomiting   Complete by: As directed    Call MD for:  temperature >100.4   Complete by: As directed    Diet - low sodium heart healthy   Complete by: As directed    Diet Carb Modified   Complete by: As directed    Discharge instructions   Complete by: As directed    1)Take Aspirin 81 mg daily along with Plavix 75 mg daily for 21 days then after that STOP the Plavix  and continue ONLY Aspirin 81 mg daily  indefinitely--  2)Short acting insulin per sliding scale 0-10 ---:-- Insulin injection 0-10 Units 0-10 Units Subcutaneous, 3 times daily with meals CBG 70 - 120: 0 unit CBG 121 - 150: 0 unit  CBG 151 - 200: 1 unit CBG 201 - 250: 2 units CBG 251 - 300: 4 units CBG 301 - 350: 6 units  CBG 351 - 400: 8 units  CBG > 400: 10 units  3)Avoid ibuprofen/Advil/Aleve/Motrin/Goody Powders/Naproxen/BC powders/Meloxicam/Diclofenac/Indomethacin and other Nonsteroidal anti-inflammatory medications as these will make you more likely to bleed and can cause stomach ulcers, can also cause Kidney problems.   4)follow up with Neurologist in about 4 to 6 weeks for recheck   Increase activity slowly   Complete by: As directed        Discharge Medications     Allergies as of 06/23/2023       Reactions   Penicillins Rash           Medication List     TAKE these medications    acetaminophen 325 MG tablet Commonly known as: TYLENOL Take 2 tablets (650 mg total) by mouth every 4 (four) hours as needed for mild pain (or temp > 37.5 C (99.5 F)).   aspirin EC 81 MG tablet Take 1 tablet (81 mg total) by mouth daily with breakfast. -Take Aspirin 81 mg daily along with Plavix 75 mg daily for 21 days then after that STOP the Plavix  and continue ONLY Aspirin 81 mg daily indefinitely--   atorvastatin 40 MG tablet Commonly known as: LIPITOR Take 1 tablet (40 mg total) by mouth daily. Start taking on: June 24, 2023   B-D ULTRAFINE III SHORT PEN 31G X 8 MM Misc Generic drug: Insulin Pen Needle 1 each by Does not apply route as directed.   blood glucose meter kit and supplies 1 each by Other route 4 (four) times daily. Dispense based on patient and insurance preference. Use up to four times daily as directed. (FOR ICD-10 E10.9, E11.9).   clopidogrel 75 MG tablet Commonly known  as: PLAVIX Take 1 tablet (75 mg total) by mouth daily. Take Aspirin 81 mg daily along with Plavix 75 mg daily for 21 days then  after that STOP the Plavix  and continue ONLY Aspirin 81 mg daily indefinitely-- Start taking on: June 24, 2023   insulin aspart 100 UNIT/ML FlexPen Commonly known as: NOVOLOG Inject 0-10 Units into the skin 3 (three) times daily with meals. Short acting insulin per sliding scale 0-10 ---:-- Insulin injection 0-10 Units 0-10 Units Subcutaneous, 3 times daily with meals CBG 70 - 120: 0 unit CBG 121 - 150: 0 unit  CBG 151 - 200: 1 unit CBG 201 - 250: 2 units CBG 251 - 300: 4 units CBG 301 - 350: 6 units  CBG 351 - 400: 8 units  CBG > 400: 10 units   levothyroxine 75 MCG tablet Commonly known as: SYNTHROID Take 1 tablet (75 mcg total) by mouth daily before breakfast.   losartan 25 MG tablet Commonly known as: COZAAR Take 1 tablet (25 mg total) by mouth daily.   metoprolol tartrate 25 MG tablet Commonly known as: LOPRESSOR Take 1 tablet (25 mg total) by mouth 2 (two) times daily.   OneTouch Verio test strip Generic drug: glucose blood 1 each by Other route 4 (four) times daily. Use as instructed   senna-docusate 8.6-50 MG tablet Commonly known as: Senokot-S Take 2 tablets by mouth at bedtime.   Toujeo Max SoloStar 300 UNIT/ML Solostar Pen Generic drug: insulin glargine (2 Unit Dial) Inject 42 Units into the skin at bedtime. What changed: how much to take   traMADol 50 MG tablet Commonly known as: ULTRAM Take 1 tablet (50 mg total) by mouth every 6 (six) hours as needed. pain   traZODone 100 MG tablet Commonly known as: DESYREL Take 1 tablet (100 mg total) by mouth at bedtime.       Major procedures and Radiology Reports - PLEASE review detailed and final reports for all details, in brief -   ECHOCARDIOGRAM COMPLETE BUBBLE STUDY  Result Date: 06/22/2023    ECHOCARDIOGRAM REPORT   Patient Name:   DEMIAN VOGELE Date of Exam: 06/22/2023 Medical Rec #:  [id number]        Height:       72.0 in Accession #:    [id number]       Weight:       216.0 lb Date of Birth:  February 14, 1946         BSA:          2.201 m Patient Age:    76 years         BP:           163/80 mmHg Patient Gender: M                HR:           86 bpm. [facility] Procedure: 2D Echo, Cardiac Doppler, Color Doppler and Saline Contrast Bubble            Study Indications:    Stroke 434.91 / I63.9  History:        Patient has prior history of Echocardiogram examinations, most                 recent 06/12/2015. CAD, Signs/Symptoms:Altered Mental Status;                 Risk Factors:Hypertension, Diabetes, Former Smoker and  Dyslipidemia.  Sonographer:    Aron Baba Referring Phys: [id number] [facility]  Sonographer Comments: Suboptimal parasternal window and suboptimal subcostal window. Image acquisition challenging due to uncooperative patient and Image acquisition challenging due to respiratory motion. IMPRESSIONS  1. Left ventricular ejection fraction, by estimation, is 70 to 75%. The left ventricle has hyperdynamic function. The left ventricle has no regional wall motion abnormalities. Left ventricular diastolic parameters are consistent with Grade I diastolic dysfunction (impaired relaxation).  2. Right ventricular systolic function was not well visualized. The right ventricular size is normal.  3. The mitral valve is grossly normal. No evidence of mitral valve regurgitation. No evidence of mitral stenosis.  4. The aortic valve was not well visualized. Aortic valve regurgitation is not visualized. Aortic valve sclerosis/calcification is present, without any evidence of aortic stenosis.  5. Agitated saline contrast bubble study was negative, with no evidence of any interatrial shunt. Comparison(s): No prior Echocardiogram. FINDINGS  Left Ventricle: Left ventricular ejection fraction, by estimation, is 70 to 75%. The left ventricle has hyperdynamic function. The left ventricle has no regional wall motion abnormalities. The left ventricular internal cavity size was normal in size. There is no left  ventricular hypertrophy. Left ventricular diastolic parameters are consistent with Grade I diastolic dysfunction (impaired relaxation). Right Ventricle: The right ventricular size is normal. No increase in right ventricular wall thickness. Right ventricular systolic function was not well visualized. Left Atrium: Left atrial size was normal in size. Right Atrium: Right atrial size was normal in size. Pericardium: There is no evidence of pericardial effusion. Mitral Valve: The mitral valve is grossly normal. There is mild thickening of the mitral valve leaflet(s). There is moderate calcification of the mitral valve leaflet(s). No evidence of mitral valve regurgitation. No evidence of mitral valve stenosis. Tricuspid Valve: The tricuspid valve is grossly normal. Tricuspid valve regurgitation is trivial. No evidence of tricuspid stenosis. Aortic Valve: The aortic valve was not well visualized. Aortic valve regurgitation is not visualized. Aortic valve sclerosis/calcification is present, without any evidence of aortic stenosis. Pulmonic Valve: The pulmonic valve was grossly normal. Pulmonic valve regurgitation is not visualized. No evidence of pulmonic stenosis. Aorta: The aortic root and ascending aorta are structurally normal, with no evidence of dilitation. Venous: The inferior vena cava was not well visualized. IAS/Shunts: The interatrial septum was not well visualized. Agitated saline contrast was given intravenously to evaluate for intracardiac shunting. Agitated saline contrast bubble study was negative, with no evidence of any interatrial shunt.  LEFT VENTRICLE PLAX 2D LVIDd:         4.50 cm   Diastology LVIDs:         2.60 cm   LV e' medial:    6.42 cm/s LV PW:         1.00 cm   LV E/e' medial:  12.9 LV IVS:        0.50 cm   LV e' lateral:   7.40 cm/s LVOT diam:     1.70 cm   LV E/e' lateral: 11.2 LV SV:         40 LV SV Index:   18 LVOT Area:     2.27 cm  RIGHT VENTRICLE RV S prime:     9.57 cm/s TAPSE  (M-mode): 0.8 cm LEFT ATRIUM             Index        RIGHT ATRIUM           Index LA Vol (A2C):  34.6 ml 15.72 ml/m  RA Area:     10.90 cm LA Vol (A4C):   48.6 ml 22.08 ml/m  RA Volume:   19.20 ml  8.72 ml/m LA Biplane Vol: 41.0 ml 18.62 ml/m  AORTIC VALVE LVOT Vmax:   94.60 cm/s LVOT Vmean:  62.900 cm/s LVOT VTI:    0.176 m  AORTA Ao Root diam: 3.40 cm Ao Asc diam:  3.30 cm MITRAL VALVE                TRICUSPID VALVE MV Area (PHT): 3.30 cm     TR Peak grad:   7.5 mmHg MV Decel Time: 230 msec     TR Vmax:        137.00 cm/s MR Peak grad: 2.8 mmHg MR Vmax:      83.80 cm/s    SHUNTS MV E velocity: 82.60 cm/s   Systemic VTI:  0.18 m MV A velocity: 120.00 cm/s  Systemic Diam: 1.70 cm MV E/A ratio:  0.69 Vishnu Priya Mallipeddi Electronically signed by Winfield Rast Mallipeddi Signature Date/Time: 06/22/2023/4:43:03 PM    Final    MR BRAIN WO CONTRAST  Result Date: 06/21/2023 CLINICAL DATA:  Stroke suspected EXAM: MRI HEAD WITHOUT CONTRAST MRA HEAD WITHOUT CONTRAST TECHNIQUE: Multiplanar, multi-echo pulse sequences of the brain and surrounding structures were acquired without intravenous contrast. Angiographic images of the Circle of Willis were acquired using MRA technique without intravenous contrast. COMPARISON:  Same-day CT head FINDINGS: MRI HEAD FINDINGS Brain: There are small acute infarcts in the right lentiform nucleus and caudate tail, a small acute cortical infarct in the right occipital lobe, and punctate acute cortical infarcts in the frontal and parietal lobes. There is no hemorrhage or mass effect. There is no acute intracranial hemorrhage or extra-axial fluid collection There is background parenchymal volume loss with prominence of the ventricular system and extra-axial CSF spaces. Patchy FLAIR signal abnormality in the supratentorial white matter likely reflects sequela of moderate underlying chronic small-vessel ischemic change. There are additional remote lacunar infarcts in the right basal  ganglia, pons, left corona radiata and bilateral cerebellar hemispheres. The pituitary and suprasellar region are normal. There is no mass lesion. There is no mass effect or midline shift. Vascular: See below. Skull and upper cervical spine: Normal marrow signal. Sinuses/Orbits: The paranasal sinuses are clear. The globes and orbits are unremarkable. Other: The mastoid air cells and middle ear cavities are clear. MRA HEAD FINDINGS Anterior circulation: There is mild atherosclerotic irregularity of the intracranial ICAs without high-grade stenosis or occlusion. The bilateral MCAs are patent. There is focal moderate stenosis of the origin of the inferior M2 branch (1009-5). The bilateral ACAS are patent, without proximal high-grade stenosis or occlusion. There is no aneurysm or AVM. Posterior circulation: The left V4 segment is occluded, presumed chronic. The right V4 segment is patent. There is multifocal atherosclerotic irregularity of the basilar artery with moderate stenosis proximally. The PCAs are primarily supplied by prominent posterior communicating arteries bilaterally (fetal origins). There is moderate stenosis of the proximal left P2 segment (12-94), and moderate stenosis of the distal right posterior communicating artery. There is no aneurysm or AVM. Anatomic variants: As above. IMPRESSION: 1. 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 2. Multiple additional small remote infarcts and background chronic small-vessel ischemic change as above. 3. Intracranial atherosclerotic disease as above with mild irregularity of the  intracranial ICAs, occluded left V4 segment which is favored chronic, moderate stenosis of the basilar artery, moderate proximal right P2 stenosis, and moderate bilateral PCA stenosis. Electronically Signed   By: Lesia Hausen M.D.    On: 06/21/2023 15:11   MR ANGIO HEAD WO CONTRAST  Result Date: 06/21/2023 CLINICAL DATA:  Stroke suspected EXAM: MRI HEAD WITHOUT CONTRAST MRA HEAD WITHOUT CONTRAST TECHNIQUE: Multiplanar, multi-echo pulse sequences of the brain and surrounding structures were acquired without intravenous contrast. Angiographic images of the Circle of Willis were acquired using MRA technique without intravenous contrast. COMPARISON:  Same-day CT head FINDINGS: MRI HEAD FINDINGS Brain: There are small acute infarcts in the right lentiform nucleus and caudate tail, a small acute cortical infarct in the right occipital lobe, and punctate acute cortical infarcts in the frontal and parietal lobes. There is no hemorrhage or mass effect. There is no acute intracranial hemorrhage or extra-axial fluid collection There is background parenchymal volume loss with prominence of the ventricular system and extra-axial CSF spaces. Patchy FLAIR signal abnormality in the supratentorial white matter likely reflects sequela of moderate underlying chronic small-vessel ischemic change. There are additional remote lacunar infarcts in the right basal ganglia, pons, left corona radiata and bilateral cerebellar hemispheres. The pituitary and suprasellar region are normal. There is no mass lesion. There is no mass effect or midline shift. Vascular: See below. Skull and upper cervical spine: Normal marrow signal. Sinuses/Orbits: The paranasal sinuses are clear. The globes and orbits are unremarkable. Other: The mastoid air cells and middle ear cavities are clear. MRA HEAD FINDINGS Anterior circulation: There is mild atherosclerotic irregularity of the intracranial ICAs without high-grade stenosis or occlusion. The bilateral MCAs are patent. There is focal moderate stenosis of the origin of the inferior M2 branch (1009-5). The bilateral ACAS are patent, without proximal high-grade stenosis or occlusion. There is no aneurysm or AVM. Posterior circulation: The  left V4 segment is occluded, presumed chronic. The right V4 segment is patent. There is multifocal atherosclerotic irregularity of the basilar artery with moderate stenosis proximally. The PCAs are primarily supplied by prominent posterior communicating arteries bilaterally (fetal origins). There is moderate stenosis of the proximal left P2 segment (12-94), and moderate stenosis of the distal right posterior communicating artery. There is no aneurysm or AVM. Anatomic variants: As above. IMPRESSION: 1. 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 2. Multiple additional small remote infarcts and background chronic small-vessel ischemic change as above. 3. 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 Electronically Signed   By: Lesia Hausen M.D.   On: 06/21/2023 15:11   US Carotid Bilateral  Result Date: 06/21/2023 CLINICAL DATA:  Left-sided weakness and history of hypertension, hyperlipidemia, diabetes, coronary artery disease and prior smoking history. EXAM: BILATERAL CAROTID DUPLEX ULTRASOUND TECHNIQUE: Wallace Cullens scale imaging, color Doppler and duplex ultrasound were performed of bilateral carotid and vertebral arteries in the neck. COMPARISON:  None Available. FINDINGS: Criteria: Quantification of carotid stenosis is based on velocity parameters that correlate the residual internal carotid diameter with NASCET-based stenosis levels, using the diameter of the distal internal carotid lumen as the denominator for stenosis measurement. The following velocity measurements were obtained: RIGHT ICA:  92/8 cm/sec CCA:  106/8 cm/sec SYSTOLIC  ICA/CCA RATIO:   0.9 ECA:  131 cm/sec LEFT ICA:  114/22 cm/sec CCA:  111/17 cm/sec SYSTOLIC ICA/CCA RATIO:  1.0 ECA:  88 cm/sec RIGHT CAROTID ARTERY: Minimal plaque at the right ICA origin. Estimated right ICA stenosis is less than 50%. RIGHT VERTEBRAL ARTERY: Antegrade flow with normal waveform and velocity. LEFT CAROTID ARTERY: Mild to moderate partially calcified plaque at the level of the carotid bulb extending into the proximal left ICA. Estimated left ICA stenosis is less than 50%. LEFT VERTEBRAL ARTERY: Antegrade flow with normal waveform and velocity. IMPRESSION: 1. Mild to moderate partially calcified plaque at the left carotid bulb extending into the proximal left ICA. Estimated left ICA stenosis is less than 50%. 2. Minimal plaque at the right ICA origin. Estimated right ICA stenosis is less than 50%. Electronically Signed   By: Irish Lack M.D.   On: 06/21/2023 12:15   CT HEAD CODE STROKE WO CONTRAST  Result Date: 06/21/2023 CLINICAL DATA:  Code stroke.  Acute neurologic deficit EXAM: CT HEAD WITHOUT CONTRAST TECHNIQUE: Contiguous axial images were obtained from the base of the skull through the vertex without intravenous contrast. RADIATION DOSE REDUCTION: This exam was performed according to the departmental dose-optimization program which includes automated exposure control, adjustment of the mA and/or kV according to patient size and/or use of iterative reconstruction technique. COMPARISON:  None Available. FINDINGS: Brain: There is no mass, hemorrhage or extra-axial collection. There is generalized atrophy without lobar predilection. Old bilateral cerebellar infarcts. There is periventricular hypoattenuation compatible with chronic microvascular disease. Vascular: No abnormal hyperdensity of the major intracranial arteries or dural venous sinuses. No intracranial atherosclerosis. Skull: The visualized skull base, calvarium and extracranial soft tissues are normal. Sinuses/Orbits: No fluid levels or advanced  mucosal thickening of the visualized paranasal sinuses. No mastoid or middle ear effusion. The orbits are normal. ASPECTS [facility] Stroke Program Early CT Score) - Ganglionic level infarction (caudate, lentiform nuclei, internal capsule, insula, M1-M3 cortex): 7 - Supraganglionic infarction (M4-M6 cortex): 3 Total score (0-10 with 10 being normal): 10 IMPRESSION: 1. No acute intracranial abnormality. 2. ASPECTS is 10. These results were called by telephone at the time of interpretation on 06/21/2023 at 12:47 am to provider Geoffery Lyons , who verbally acknowledged these results. Electronically Signed   By: Deatra Robinson M.D.   On: 06/21/2023 00:47    Today   Subjective    Brax Bittenbender today has no new complaints  No fever  Or chills   No Nausea, Vomiting or Diarrhea -Left-sided weakness is slowly improving -Wife at bedside, questions answered        Patient has been seen and examined prior to discharge   Objective   Blood pressure (!) 143/81, pulse 82, temperature 98.2 F (36.8 C), temperature source Oral, resp. rate 16, height 6' (1.829 m), weight 98 kg, SpO2 96%.   Intake/Output Summary (Last 24 hours) at 06/23/2023 1000 Last data filed at 06/23/2023 0641 Gross per 24 hour  Intake --  Output 500 ml  Net -500 ml   Exam Gen:- Awake Alert,  in no apparent distress  HEENT:- [place].AT, No sclera icterus Neck-Supple Neck,No JVD,.  Lungs-  CTAB , fair symmetrical air movement CV- S1, S2 normal, regular , 3/6 SM Abd-  +ve B.Sounds, Abd Soft, No tenderness,    Extremity/Skin:- No  edema, pedal pulses present  Psych-affect is appropriate, oriented x3 Neuro-left-sided weakness persist ,  no tremors   Data Review   CBC w Diff:  Lab Results  Component  Value Date   WBC 9.3 06/21/2023   HGB 13.6 06/21/2023   HCT 40.1 06/21/2023   PLT 167 06/21/2023   LYMPHOPCT 41 06/21/2023   MONOPCT 10 06/21/2023   EOSPCT 3 06/21/2023   BASOPCT 0 06/21/2023   CMP:  Lab Results  Component Value  Date   NA 141 06/21/2023   K 3.6 06/21/2023   CL 103 06/21/2023   CO2 28 06/21/2023   BUN 16 06/21/2023   CREATININE 1.38 (H) 06/21/2023   CREATININE 1.21 (H) 10/20/2019   PROT 7.1 06/21/2023   ALBUMIN 3.8 06/21/2023   BILITOT 0.6 06/21/2023   ALKPHOS 59 06/21/2023   AST 19 06/21/2023   ALT 14 06/21/2023   Total Discharge time is about 33 minutes  Shon Hale M.D on 06/23/2023 at 10:00 AM  Go to [email or web address] -  for contact info  [facility] - Office  [phone]

## 2023-06-23 NOTE — Plan of Care (Signed)
  Problem: Education: Goal: Knowledge of General Education information will improve Description: Including pain rating scale, medication(s)/side effects and non-pharmacologic comfort measures Outcome: Not Progressing   Problem: Health Behavior/Discharge Planning: Goal: Ability to manage health-related needs will improve Outcome: Not Progressing   

## 2023-06-23 NOTE — TOC Transition Note (Signed)
Transition of Care [facility]) - CM/SW Discharge Note   Patient Details  Name: Hunter Sparks MRN: [id number] Date of Birth: October 26, 1946  [facility] CM/SW Contact:  Catalina Gravel, LCSW Phone Number: 06/23/2023, 9:58 AM   Clinical Narrative:    CSW contacted by RN at [facility]- bed ready today and [facility] will transport pt. CSW contacted spouse and informed as well, she was pleased and aware they are on their way to transport.  No further TOC needs at this time.      Barriers to Discharge: No Barriers Identified   Patient Goals and CMS Choice      Discharge Placement                         Discharge Plan and Services Additional resources added to the After Visit Summary for                                       Social Determinants of Health (SDOH) Interventions SDOH Screenings   Tobacco Use: High Risk (06/21/2023)     Readmission Risk Interventions     No data to display

## 2023-06-23 NOTE — H&P (Signed)
Physical Medicine and Rehabilitation Admission H&P    No chief complaint on file.  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  Patient had Stroke work up and MRI showed scattered small infarcts in Right Basal ganglia, frontal, parietal, occipital lobes- MCA/PCA watershed distributions.  NIHSS  of 3 His LDL is 165 and HDL 33, A1c found to be 8.8; EF 70%>- and grade 1 diastolic dysfunction.  He was seen by Neurology- who placed him on ASA and Plavix for 3 weeks then ASA alone.  He was started on permissive HTN protocol.  Patient was also seen by therapy PT and OT and felt to have a decline in his level of function and was admitted to [facility] for acute therapies.   Pt reports LBM 3-4 days ago, but that's normal for him- denies constipation.  Using Purewick to void, but thinks might be able to use Urinal.   Biggest issue in his mind, he "wants to go home".   His back also hurts at night -uses Tramadol 50 mg every evening for pain   Review of Systems  Constitutional: Negative.   HENT: Negative.    Eyes:  Negative for blurred vision and double vision.  Respiratory: Negative.  Negative for cough and shortness of breath.   Cardiovascular: Negative.  Negative for orthopnea and leg swelling.  Gastrointestinal:  Negative for constipation, diarrhea and nausea.  Genitourinary: Negative.  Negative for frequency and urgency.  Musculoskeletal:  Positive for back pain. Negative for myalgias.  Skin: Negative.   Neurological:  Positive for speech change and focal weakness. Negative for sensory change.  Endo/Heme/Allergies: Negative.   Psychiatric/Behavioral: Negative.  Negative for depression and hallucinations.   All other systems reviewed and are negative.  Past Medical History:  Diagnosis Date   Angina, class II  (HCC)    Arthritis    "back, hips, legs" (06/10/2015)   Chest pain    Chronic lower back pain    Depression    DJD (degenerative joint disease)    Hyperlipidemia 06/11/2015   Hypertension    Hypothyroidism    Kidney stone    Kidney stone    Type II diabetes mellitus (HCC)    Past Surgical History:  Procedure Laterality Date   CARDIAC CATHETERIZATION N/A 06/11/2015   Procedure: Left Heart Cath and Coronary Angiography;  Surgeon: Lyn Records, MD;  Location: [facility] INVASIVE CV LAB;  Service: Cardiovascular;  Laterality: N/A;   COLONOSCOPY  06/21/2011   Procedure: COLONOSCOPY;  Surgeon: Malissa Hippo, MD;  [facility];  Service: Endoscopy;  Laterality: N/A;   COLONOSCOPY     COLONOSCOPY N/A 09/20/2017   Procedure: COLONOSCOPY;  Surgeon: Malissa Hippo, MD;  Location: [facility];  Service: Endoscopy;  Laterality: N/A;  1200   CORONARY ARTERY BYPASS GRAFT N/A 06/14/2015   Procedure: CORONARY ARTERY BYPASS GRAFTING times four using Left Internal mammary artery and right leg Saphenous vein graft;  Surgeon: Kerin Perna, MD;  Location: [facility] OR;  Service: Open Heart Surgery;  Laterality: N/A;   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   HAND SURGERY     SKIN CANCER EXCISION Left    "forearm"   TEE WITHOUT CARDIOVERSION N/A 06/14/2015   Procedure: TRANSESOPHAGEAL ECHOCARDIOGRAM (TEE);  Surgeon: Kerin Perna, MD;  Location: [facility]  OR;  Service: Open Heart Surgery;  Laterality: N/A;   Family History  Problem Relation Age of Onset   Stroke Mother    Hypertension Mother    Stomach cancer Father    Liver cancer Father    Cancer Father    Rectal cancer Brother    Cancer Brother    Heart attack Neg Hx    Social History:  reports that he quit smoking about 19 years ago. His smoking use included cigarettes. He started smoking about 64 years ago. He has a 90 pack-year smoking history. His smokeless tobacco use includes chew. He reports that he does not drink alcohol and does not  use drugs. Allergies:  Allergies  Allergen Reactions   Penicillins Rash        Medications Prior to Admission  Medication Sig Dispense Refill   acetaminophen (TYLENOL) 325 MG tablet Take 2 tablets (650 mg total) by mouth every 4 (four) hours as needed for mild pain (or temp > 37.5 C (99.5 F)).     aspirin EC 81 MG tablet Take 1 tablet (81 mg total) by mouth daily with breakfast. -Take Aspirin 81 mg daily along with Plavix 75 mg daily for 21 days then after that STOP the Plavix  and continue ONLY Aspirin 81 mg daily indefinitely-- 30 tablet 11   [START ON 06/24/2023] atorvastatin (LIPITOR) 40 MG tablet Take 1 tablet (40 mg total) by mouth daily. 30 tablet 4   blood glucose meter kit and supplies 1 each by Other route 4 (four) times daily. Dispense based on patient and insurance preference. Use up to four times daily as directed. (FOR ICD-10 E10.9, E11.9). 1 each 0   [START ON 06/24/2023] clopidogrel (PLAVIX) 75 MG tablet Take 1 tablet (75 mg total) by mouth daily. Take Aspirin 81 mg daily along with Plavix 75 mg daily for 21 days then after that STOP the Plavix  and continue ONLY Aspirin 81 mg daily indefinitely-- 30 tablet 0   glucose blood (ONETOUCH VERIO) test strip 1 each by Other route 4 (four) times daily. Use as instructed 150 each 5   insulin aspart (NOVOLOG) 100 UNIT/ML FlexPen Inject 0-10 Units into the skin 3 (three) times daily with meals. Short acting insulin per sliding scale 0-10 ---:-- Insulin injection 0-10 Units 0-10 Units Subcutaneous, 3 times daily with meals CBG 70 - 120: 0 unit CBG 121 - 150: 0 unit  CBG 151 - 200: 1 unit CBG 201 - 250: 2 units CBG 251 - 300: 4 units CBG 301 - 350: 6 units  CBG 351 - 400: 8 units  CBG > 400: 10 units 15 mL 11   insulin glargine, 2 Unit Dial, (TOUJEO MAX SOLOSTAR) 300 UNIT/ML Solostar Pen Inject 42 Units into the skin at bedtime. 15 mL 0   Insulin Pen Needle (B-D ULTRAFINE III SHORT PEN) 31G X 8 MM MISC 1 each by Does not apply route as directed.  100 each 3   levothyroxine (SYNTHROID) 75 MCG tablet Take 1 tablet (75 mcg total) by mouth daily before breakfast. 90 tablet 0   losartan (COZAAR) 25 MG tablet Take 1 tablet (25 mg total) by mouth daily. 90 tablet 0   metoprolol tartrate (LOPRESSOR) 25 MG tablet Take 1 tablet (25 mg total) by mouth 2 (two) times daily. 180 tablet 0   senna-docusate (SENOKOT-S) 8.6-50 MG tablet Take 2 tablets by mouth at bedtime. 60 tablet 1   traMADol (ULTRAM) 50 MG tablet Take 1 tablet (50 mg total)  by mouth every 6 (six) hours as needed. pain 30 tablet 0   traZODone (DESYREL) 100 MG tablet Take 1 tablet (100 mg total) by mouth at bedtime. 30 tablet 2      Home: Home Living Family/patient expects to be discharged to:: Private residence Living Arrangements: Spouse/significant other   Functional History:    Functional Status:  Mobility:          ADL:    Cognition: Cognition Orientation Level: Oriented to person, Disoriented to place, Disoriented to time, Disoriented to situation    Physical Exam: Blood pressure (!) 150/93, pulse 73, temperature 98 F (36.7 C), temperature source Oral, resp. rate 18, height 6' (1.829 m), weight 85.2 kg, SpO2 97%. Physical Exam Vitals and nursing note reviewed.  Constitutional:      Appearance: Normal appearance.     Comments: Pt awake, alert, appropriate, sitting up in bed; asking to go home, but admits needs to stay for awhile; NAD  HENT:     Head: Normocephalic and atraumatic.     Comments: Facial sensation intact Maybe very slight L facial droop- equivocal No tongue deviation    Right Ear: External ear normal.     Left Ear: External ear normal.     Nose: Nose normal.     Mouth/Throat:     Mouth: Mucous membranes are dry.     Pharynx: Oropharynx is clear. No oropharyngeal exudate.  Eyes:     General:        Right eye: No discharge.        Left eye: No discharge.     Extraocular Movements: Extraocular movements intact.  Cardiovascular:      Rate and Rhythm: Normal rate and regular rhythm.     Heart sounds: Normal heart sounds. No murmur heard.    No gallop.  Pulmonary:     Effort: Pulmonary effort is normal. No respiratory distress.     Breath sounds: Normal breath sounds. No wheezing, rhonchi or rales.  Abdominal:     General: There is distension.     Palpations: Abdomen is soft.     Tenderness: There is no abdominal tenderness.     Comments: Mild distension vs protuberant Hypoactive BS  Genitourinary:    Comments: Purewick in place- light amber urine Musculoskeletal:     Cervical back: Neck supple. No tenderness.     Comments: RUE 5/5 in biceps, triceps, WE< grip and FA LUE- 5-/5 at worst in all muscles RLE- 5-/5 esp notable in R HF LLE- 4+/5 in HF/KE/KF and 5-/5 in DF/PF  Skin:    General: Skin is warm and dry.     Comments: Mild umbilical hernia noted R>L hand contractures- mainly PIPs notable PIPs flexed/contracted- 5th digit on R completely curled  Neurological:     Mental Status: He is alert.     Comments: Slightly delayed on responses, but asking appropriate questions Intact to light touch in all 4 extremities DTRs 1+ and no increased tone seen  Psychiatric:     Comments: Flat affect     Results for orders placed or performed during the hospital encounter of 06/23/23 (from the past 48 hour(s))  Glucose, capillary     Status: Abnormal   Collection Time: 06/23/23  4:31 PM  Result Value Ref Range   Glucose-Capillary 165 (H) 70 - 99 mg/dL    Comment: Glucose reference range applies only to samples taken after fasting for at least 8 hours.   ECHOCARDIOGRAM COMPLETE BUBBLE STUDY  Result Date:  06/22/2023    ECHOCARDIOGRAM REPORT   Patient Name:   Hunter Sparks Date of Exam: 06/22/2023 Medical Rec #:  [id number]        Height:       72.0 in Accession #:    [id number]       Weight:       216.0 lb Date of Birth:  [birth year]        BSA:          2.201 m Patient Age:    76 years         BP:           163/80 mmHg  Patient Gender: M                HR:           86 bpm. [facility] Procedure: 2D Echo, Cardiac Doppler, Color Doppler and Saline Contrast Bubble            Study Indications:    Stroke 434.91 / I63.9  History:        Patient has prior history of Echocardiogram examinations, most                 recent 06/12/2015. CAD, Signs/Symptoms:Altered Mental Status;                 Risk Factors:Hypertension, Diabetes, Former Smoker and                 Dyslipidemia.  Sonographer:    Aron Baba Referring Phys: [id number] [facility]  Sonographer Comments: Suboptimal parasternal window and suboptimal subcostal window. Image acquisition challenging due to uncooperative patient and Image acquisition challenging due to respiratory motion. IMPRESSIONS  1. Left ventricular ejection fraction, by estimation, is 70 to 75%. The left ventricle has hyperdynamic function. The left ventricle has no regional wall motion abnormalities. Left ventricular diastolic parameters are consistent with Grade I diastolic dysfunction (impaired relaxation).  2. Right ventricular systolic function was not well visualized. The right ventricular size is normal.  3. The mitral valve is grossly normal. No evidence of mitral valve regurgitation. No evidence of mitral stenosis.  4. The aortic valve was not well visualized. Aortic valve regurgitation is not visualized. Aortic valve sclerosis/calcification is present, without any evidence of aortic stenosis.  5. Agitated saline contrast bubble study was negative, with no evidence of any interatrial shunt. Comparison(s): No prior Echocardiogram. FINDINGS  Left Ventricle: Left ventricular ejection fraction, by estimation, is 70 to 75%. The left ventricle has hyperdynamic function. The left ventricle has no regional wall motion abnormalities. The left ventricular internal cavity size was normal in size. There is no left ventricular hypertrophy. Left ventricular diastolic parameters are consistent with  Grade I diastolic dysfunction (impaired relaxation). Right Ventricle: The right ventricular size is normal. No increase in right ventricular wall thickness. Right ventricular systolic function was not well visualized. Left Atrium: Left atrial size was normal in size. Right Atrium: Right atrial size was normal in size. Pericardium: There is no evidence of pericardial effusion. Mitral Valve: The mitral valve is grossly normal. There is mild thickening of the mitral valve leaflet(s). There is moderate calcification of the mitral valve leaflet(s). No evidence of mitral valve regurgitation. No evidence of mitral valve stenosis. Tricuspid Valve: The tricuspid valve is grossly normal. Tricuspid valve regurgitation is trivial. No evidence of tricuspid stenosis. Aortic Valve: The aortic valve was not well visualized. Aortic valve regurgitation is not visualized. Aortic valve sclerosis/calcification  is present, without any evidence of aortic stenosis. Pulmonic Valve: The pulmonic valve was grossly normal. Pulmonic valve regurgitation is not visualized. No evidence of pulmonic stenosis. Aorta: The aortic root and ascending aorta are structurally normal, with no evidence of dilitation. Venous: The inferior vena cava was not well visualized. IAS/Shunts: The interatrial septum was not well visualized. Agitated saline contrast was given intravenously to evaluate for intracardiac shunting. Agitated saline contrast bubble study was negative, with no evidence of any interatrial shunt.  LEFT VENTRICLE PLAX 2D LVIDd:         4.50 cm   Diastology LVIDs:         2.60 cm   LV e' medial:    6.42 cm/s LV PW:         1.00 cm   LV E/e' medial:  12.9 LV IVS:        0.50 cm   LV e' lateral:   7.40 cm/s LVOT diam:     1.70 cm   LV E/e' lateral: 11.2 LV SV:         40 LV SV Index:   18 LVOT Area:     2.27 cm  RIGHT VENTRICLE RV S prime:     9.57 cm/s TAPSE (M-mode): 0.8 cm LEFT ATRIUM             Index        RIGHT ATRIUM           Index LA Vol  (A2C):   34.6 ml 15.72 ml/m  RA Area:     10.90 cm LA Vol (A4C):   48.6 ml 22.08 ml/m  RA Volume:   19.20 ml  8.72 ml/m LA Biplane Vol: 41.0 ml 18.62 ml/m  AORTIC VALVE LVOT Vmax:   94.60 cm/s LVOT Vmean:  62.900 cm/s LVOT VTI:    0.176 m  AORTA Ao Root diam: 3.40 cm Ao Asc diam:  3.30 cm MITRAL VALVE                TRICUSPID VALVE MV Area (PHT): 3.30 cm     TR Peak grad:   7.5 mmHg MV Decel Time: 230 msec     TR Vmax:        137.00 cm/s MR Peak grad: 2.8 mmHg MR Vmax:      83.80 cm/s    SHUNTS MV E velocity: 82.60 cm/s   Systemic VTI:  0.18 m MV A velocity: 120.00 cm/s  Systemic Diam: 1.70 cm MV E/A ratio:  0.69 Vishnu Priya Mallipeddi Electronically signed by Winfield Rast Mallipeddi Signature Date/Time: 06/22/2023/4:43:03 PM    Final       Blood pressure (!) 150/93, pulse 73, temperature 98 F (36.7 C), temperature source Oral, resp. rate 18, height 6' (1.829 m), weight 85.2 kg, SpO2 97%.  Medical Problem List and Plan: 1. Functional deficits secondary to R brain scattered strokes in watershed distribution with L hemiparesis  -patient may  shower  -ELOS/Goals: 10-14 days- supervision to mod I 2.  Antithrombotics: -DVT/anticoagulation:  Pharmaceutical: Lovenox  -antiplatelet therapy: Plavix and ASA x 3 week sthen ASA alone 3. Pain Management: will add Tramadol 50 mg q6pm per pt request- was home dose- and tylenol prn 4. Mood/Behavior/Sleep: trazodone 100 mg at bedtime- is home dose  -antipsychotic agents: n/a 5. Neuropsych/cognition: This patient appears capable of making decisions on his own behalf. 6. Skin/Wound Care: routine skin care/prevention 7. Fluids/Electrolytes/Nutrition: will check labs in AM 8. CAD/HTN- had been on Metoprolol at home,  however being held due to permissive HTN- can restart when needed 9. CKD3A- at baseline- will recheck in AM 10. HLD- con't statin 40 mg daily 11. DM- insulin dependent- on insulin at home- A1c 8.8- con't SSI andSenlgee 42 units at bedtime 12.  Constipation- normal for pt to go q3-4 days at home- 13. Hypothyroidism- con't synthroid daily.  14. Grade 1 diastolic dysfunction- con't to monitor weights. Will add Daily weights if needed 15. Purwick- will stop usage in AM      I have personally performed a face to face diagnostic evaluation of this patient and formulated the key components of the plan.  Additionally, I have personally reviewed laboratory data, imaging studies   The patient's status has not changed from the original H&P.  Any changes in documentation from the acute care chart have been noted above.     Genice Rouge, MD 06/23/2023

## 2023-06-23 NOTE — Progress Notes (Signed)
Signed     Expand All Collapse All PMR Admission Coordinator Pre-Admission Assessment   Patient: Hunter Sparks is an 77 y.o., male MRN: [id number] DOB: 05-15-1946 Height: 6' (182.9 cm) Weight: 98 kg   Insurance Information HMO: yes    PPO:      PCP:      IPA:      80/20:      OTHER:  PRIMARY: [employer]      Policy#: [id number]      Subscriber: pt CM Name: Judeth Cornfield      Phone#: [phone]     Fax#: [phone]/EMR access Pre-Cert#: [id number] approved for 7 days      Employer:  Benefits:  Phone #: [phone]     Name: 8/9 Eff. Date: 11/13/22     Deduct: none      Out of Pocket Max: $3500 CIR: $225 co pay per day days 1 until 6      SNF: no co pay per day days 1 until 20; $203 co pay per day days 21 until 100 Outpatient: $15 per visit     Co-Pay: visits per medical neccesity Home Health: 80%      Co-Pay: 20% DME: 80%     Co-Pay: 20% Providers: in network  SECONDARY: Guarantee [employer]      Policy#: [id number]        Financial Counselor:       Phone#:    The "Data Collection Information Summary" for patients in Inpatient Rehabilitation Facilities with attached "Privacy Act Statement-Health Care Records" was provided and verbally reviewed with: Family   Emergency Contact Information Contact Information       Name Relation Home Work [place]     [phone]         Other Contacts       Name Relation Home Work Mobile    Parrish Son     [phone]    Lawarnce, Chugh     [phone]         Current Medical History  Patient Admitting Diagnosis: CVA   History of Present Illness: 77 year old male with history of HTN, HLD, type 2 DM, CKD 3 a, CAD and hypothyroidism. Presented to [facility] on 06/21/23 due to slurred speech and left sided weakness.    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 Neurology consulted. Recommend ASA and Plavix for 3 weeks followed by ASA alone. Recommend atorvastatin for LDL 165 and HDL  33. Carotid dopplers without significant stenosis. To allow permissive HTN. Uncontrolled DM with Hgb A1c 8.8. Use SSI and monitor CBGS. Renal dose meds and monitor for history of CKD 3 a.Synthroid for hypothyroidism.    Complete NIHSS TOTAL: 2   Patient's medical record from [facility] has been reviewed by the rehabilitation admission coordinator and physician.   Past Medical History      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HCC)     Arthritis      "back, hips, legs" (06/10/2015)   Chest pain     Chronic lower back pain     Depression     DJD (degenerative joint disease)     Hyperlipidemia 06/11/2015   Hypertension     Hypothyroidism     Kidney stone     Kidney stone     Type II diabetes mellitus (HCC)          Has the patient had major surgery during 100 days  prior to admission? No   Family History   family history includes Cancer in his brother and father; Hypertension in his mother; Liver cancer in his father; Rectal cancer in his brother; Stomach cancer in his father; Stroke in his mother.   Current Medications  Current Medications    Current Facility-Administered Medications:     stroke: early stages of recovery book, , Does not apply, Once, Opyd, Lavone Neri, MD   acetaminophen (TYLENOL) tablet 650 mg, 650 mg, Oral, Q4H PRN **OR** acetaminophen (TYLENOL) 160 MG/5ML solution 650 mg, 650 mg, Per Tube, Q4H PRN **OR** acetaminophen (TYLENOL) suppository 650 mg, 650 mg, Rectal, Q4H PRN, Opyd, Lavone Neri, MD   aspirin EC tablet 81 mg, 81 mg, Oral, Daily, Opyd, Lavone Neri, MD, 81 mg at 06/22/23 0943   atorvastatin (LIPITOR) tablet 40 mg, 40 mg, Oral, Daily, Emokpae, Courage, MD, 40 mg at 06/22/23 0943   [START ON 06/23/2023] clopidogrel (PLAVIX) tablet 75 mg, 75 mg, Oral, Daily, Yadav, Priyanka O, MD   enoxaparin (LOVENOX) injection 40 mg, 40 mg, Subcutaneous, Q24H, Opyd, Timothy S, MD, 40 mg at 06/22/23 0944   insulin aspart (novoLOG) injection 0-5 Units, 0-5 Units,  Subcutaneous, QHS, Opyd, Timothy S, MD   insulin aspart (novoLOG) injection 0-6 Units, 0-6 Units, Subcutaneous, TID WC, Opyd, Lavone Neri, MD, 1 Units at 06/22/23 1316   insulin glargine-yfgn (SEMGLEE) injection 45 Units, 45 Units, Subcutaneous, QHS, Opyd, Lavone Neri, MD, 45 Units at 06/21/23 2216   levothyroxine (SYNTHROID) tablet 75 mcg, 75 mcg, Oral, Q0600, Opyd, Lavone Neri, MD, 75 mcg at 06/22/23 0626   LORazepam (ATIVAN) tablet 1 mg, 1 mg, Oral, QHS, Emokpae, Courage, MD, 1 mg at 06/21/23 2212   melatonin tablet 3 mg, 3 mg, Oral, QHS PRN, Opyd, Lavone Neri, MD, 3 mg at 06/22/23 0014   senna-docusate (Senokot-S) tablet 1 tablet, 1 tablet, Oral, QHS PRN, Opyd, Timothy S, MD   sodium chloride flush (NS) 0.9 % injection 3 mL, 3 mL, Intravenous, Once, Opyd, Lavone Neri, MD     Patients Current Diet:  Diet Order                  Diet Heart Room service appropriate? Yes; Fluid consistency: Thin  Diet effective now                       Precautions / Restrictions Precautions Precautions: Fall Restrictions Weight Bearing Restrictions: No    Has the patient had 2 or more falls or a fall with injury in the past year? No   Prior Activity Level Community (5-7x/wk): independent, active and driving, retired   Prior Functional Level Self Care: Did the patient need help bathing, dressing, using the toilet or eating? Independent   Indoor Mobility: Did the patient need assistance with walking from room to room (with or without device)? Independent   Stairs: Did the patient need assistance with internal or external stairs (with or without device)? Independent   Functional Cognition: Did the patient need help planning regular tasks such as shopping or remembering to take medications? Independent   Patient Information Are you of Hispanic, Latino/a,or Spanish origin?: A. No, not of Hispanic, Latino/a, or Spanish origin What is your race?: A. White Do you need or want an interpreter to communicate  with a doctor or health care staff?: 0. No   Patient's Response To:  Health Literacy and Transportation Is the patient able to respond to health literacy and transportation needs?: Yes Health  Literacy - How often do you need to have someone help you when you read instructions, pamphlets, or other written material from your doctor or pharmacy?: Never In the past 12 months, has lack of transportation kept you from medical appointments or from getting medications?: No In the past 12 months, has lack of transportation kept you from meetings, work, or from getting things needed for daily living?: No   Home Assistive Devices / Equipment Home Equipment: None   Prior Device Use: Indicate devices/aids used by the patient prior to current illness, exacerbation or injury? None of the above   Current Functional Level Cognition   Overall Cognitive Status: Within Functional Limits for tasks assessed General Comments: slightly drowsy due to restless over night    Extremity Assessment (includes Sensation/Coordination)   Upper Extremity Assessment: Defer to OT evaluation LUE Deficits / Details: 4-/5  for shoulder abduction and flexion; WNL otherwise for strength. Mild gross motor coordination deficts for finger to nose test. LUE Sensation: WNL LUE Coordination: decreased gross motor  Lower Extremity Assessment: Generalized weakness, LLE deficits/detail LLE Deficits / Details: grossly 3+/5 LLE Sensation: decreased light touch, decreased proprioception LLE Coordination: decreased gross motor     ADLs   Overall ADL's : Needs assistance/impaired Eating/Feeding: Sitting, Independent Grooming: Set up, Sitting Upper Body Bathing: Set up, Sitting Lower Body Bathing: Contact guard assist, Minimal assistance, Sitting/lateral leans Upper Body Dressing : Set up, Sitting Lower Body Dressing: Contact guard assist, Minimal assistance, Sitting/lateral leans Lower Body Dressing Details (indicate cue type and  reason): Much extended time and guarding assist to don socks seated at EOB. Toilet Transfer: Maximal assistance, Rolling walker (2 wheels), Tax adviser Details (indicate cue type and reason): Simulated via EOB to chair transfer Toileting- Clothing Manipulation and Hygiene: Minimal assistance, Contact guard assist, Sitting/lateral lean Tub/ Shower Transfer: Maximal assistance, Rolling walker (2 wheels), Stand-pivot Functional mobility during ADLs: Maximal assistance, Rolling walker (2 wheels)     Mobility   Overal bed mobility: Needs Assistance Bed Mobility: Supine to Sit Supine to sit: Min assist, Mod assist General bed mobility comments: slow labored movement with limited use of LUE     Transfers   Overall transfer level: Needs assistance Equipment used: Rolling walker (2 wheels) Transfers: Sit to/from Stand, Bed to chair/wheelchair/BSC Sit to Stand: Mod assist Bed to/from chair/wheelchair/BSC transfer type:: Step pivot Step pivot transfers: Mod assist General transfer comment: increased time, labored movement     Ambulation / Gait / Stairs / Wheelchair Mobility   Ambulation/Gait Ambulation/Gait assistance: Mod assist, Max assist Gait Distance (Feet): 15 Feet Assistive device: Rolling walker (2 wheels) Gait Pattern/deviations: Decreased step length - right, Decreased step length - left, Decreased stride length, Knees buckling, Ataxic, Decreased stance time - left, Steppage General Gait Details: increased endurance/distance for taking steps with slow labored cadence, decreased left dorsiflexion, diffiuclty advancing LLE due to weakness requiring occasional tactile assistance to move LLE Gait velocity: slow     Posture / Balance Dynamic Sitting Balance Sitting balance - Comments: fair/poor seated at EOB Balance Overall balance assessment: Needs assistance Sitting-balance support: Feet supported, No upper extremity supported Sitting balance-Leahy Scale: Poor Sitting  balance - Comments: fair/poor seated at EOB Postural control: Posterior lean, Left lateral lean Standing balance support: Reliant on assistive device for balance, During functional activity, Bilateral upper extremity supported Standing balance-Leahy Scale: Poor Standing balance comment: using RW due to frequent buckling of LLE     Special needs/care consideration Hgb A1c 8.8    Previous  Home Environment  Living Arrangements: Spouse/significant other  Lives With: Spouse Available Help at Discharge: Family, Available 24 hours/day Type of Home: House Home Layout: One level Home Access: Stairs to enter Entrance Stairs-Rails: None Entrance Stairs-Number of Steps: 3 Bathroom Shower/Tub: Engineer, manufacturing systems: Handicapped height Bathroom Accessibility: Yes How Accessible: Accessible via walker Home Care Services: No   Discharge Living Setting Plans for Discharge Living Setting: Patient's home, Lives with (comment) (spouse) Type of Home at Discharge: House Discharge Home Layout: One level Discharge Home Access: Stairs to enter Entrance Stairs-Rails: None Entrance Stairs-Number of Steps: 3 Discharge Bathroom Shower/Tub: Tub/shower unit Discharge Bathroom Toilet: Handicapped height Discharge Bathroom Accessibility: Yes How Accessible: Accessible via walker Does the patient have any problems obtaining your medications?: No   Social/Family/Support Systems Patient Roles: Spouse Contact Information: wife, [name] Anticipated Caregiver: wife Anticipated Caregiver's Contact Information: [phone] Ability/Limitations of Caregiver: none Caregiver Availability: 24/7 Discharge Plan Discussed with Primary Caregiver: Yes Is Caregiver In Agreement with Plan?: Yes Does Caregiver/Family have Issues with Lodging/Transportation while Pt is in Rehab?: No   Goals Patient/Family Goal for Rehab: supervision with PT and OT Expected length of stay: ELOS 10 to 14 days Additional  Information: Patient is HOH Pt/Family Agrees to Admission and willing to participate: Yes Program Orientation Provided & Reviewed with Pt/Caregiver Including Roles  & Responsibilities: Yes   Decrease burden of Care through IP rehab admission: n/a   Possible need for SNF placement upon discharge: not anticipated   Patient Condition: I have reviewed medical records from [facility], spoken with spouse. I discussed via phone for inpatient rehabilitation assessment.  Patient will benefit from ongoing PT and OT, can actively participate in 3 hours of therapy a day 5 days of the week, and can make measurable gains during the admission.  Patient will also benefit from the coordinated team approach during an Inpatient Acute Rehabilitation admission.  The patient will receive intensive therapy as well as Rehabilitation physician, nursing, social worker, and care management interventions.  Due to bladder management, bowel management, safety, skin/wound care, disease management, medication administration, pain management, and patient education the patient requires 24 hour a day rehabilitation nursing.  The patient is currently Mod-Max A with mobility and Contact Guard-Min A with basic ADLs.  Discharge setting and therapy post discharge at home with home health is anticipated.  Patient has agreed to participate in the Acute Inpatient Rehabilitation Program and will admit today.   Preadmission Screen Completed By:  Clois Dupes RN MSN  06/22/2023 4:05 PM ______________________________________________________________________   Discussed status with Dr. Berline Chough on 06/23/23  at 9:29 AM and received approval for admission today.   Admission Coordinator:  Clois Dupes, RN MSN time 9:29 AM/Date 06/23/23     Assessment/Plan: Diagnosis: scattered R cerebral strokes Does the need for close, 24 hr/day Medical supervision in concert with the patient's rehab needs make it unreasonable for this patient to be  served in a less intensive setting? Yes Co-Morbidities requiring supervision/potential complications: DM- A1c 8.8 on insulin; HTN, HLD, CAD, hypothyroidism; CKD3A, L hemi Due to bladder management, bowel management, safety, skin/wound care, disease management, medication administration, pain management, and patient education, does the patient require 24 hr/day rehab nursing? Yes Does the patient require coordinated care of a physician, rehab nurse, PT, OT, and SLP to address physical and functional deficits in the context of the above medical diagnosis(es)? Yes Addressing deficits in the following areas: balance, endurance, locomotion, strength, transferring, bowel/bladder control, bathing, dressing, feeding, grooming, toileting, and  speech Can the patient actively participate in an intensive therapy program of at least 3 hrs of therapy 5 days a week? Yes The potential for patient to make measurable gains while on inpatient rehab is good Anticipated functional outcomes upon discharge from inpatient rehab: supervision PT, supervision OT, supervision SLP Estimated rehab length of stay to reach the above functional goals is: 10-14 days Anticipated discharge destination: Home 10. Overall Rehab/Functional Prognosis: good     MD Signature:

## 2023-06-23 NOTE — Plan of Care (Signed)
  Problem: Education: Goal: Knowledge of disease or condition will improve Outcome: Progressing Goal: Knowledge of secondary prevention will improve (MUST DOCUMENT ALL) Outcome: Progressing   Problem: Education: Goal: Knowledge of General Education information will improve Description: Including pain rating scale, medication(s)/side effects and non-pharmacologic comfort measures Outcome: Not Progressing   Problem: Health Behavior/Discharge Planning: Goal: Ability to manage health-related needs will improve Outcome: Not Progressing

## 2023-06-23 NOTE — Progress Notes (Signed)
Patient rested some through the night, still attempting to get out of bed without assist, education provided on importance of calling for help. Patient lying in bed at this time, call bell within reach, bed alarm on.

## 2023-06-23 NOTE — Discharge Instructions (Signed)
1)Take Aspirin 81 mg daily along with Plavix 75 mg daily for 21 days then after that STOP the Plavix  and continue ONLY Aspirin 81 mg daily indefinitely--  2)Short acting insulin per sliding scale 0-10 ---:-- Insulin injection 0-10 Units 0-10 Units Subcutaneous, 3 times daily with meals CBG 70 - 120: 0 unit CBG 121 - 150: 0 unit  CBG 151 - 200: 1 unit CBG 201 - 250: 2 units CBG 251 - 300: 4 units CBG 301 - 350: 6 units  CBG 351 - 400: 8 units  CBG > 400: 10 units  3)Avoid ibuprofen/Advil/Aleve/Motrin/Goody Powders/Naproxen/BC powders/Meloxicam/Diclofenac/Indomethacin and other Nonsteroidal anti-inflammatory medications as these will make you more likely to bleed and can cause stomach ulcers, can also cause Kidney problems.   4)follow up with Neurologist in about 4 to 6 weeks for recheck

## 2023-06-23 NOTE — Progress Notes (Signed)
Inpatient Rehabilitation Admission Medication Review by a Pharmacist  A complete drug regimen review was completed for this patient to identify any potential clinically significant medication issues.  High Risk Drug Classes Is patient taking? Indication by Medication  Antipsychotic No   Anticoagulant Yes Enoxaparin - VTE prophylaxis  Antibiotic No   Opioid No   Antiplatelet Yes Aspirin 81 mg, clopidogrel - stroke  Hypoglycemics/insulin Yes SSI, insulin glargine - DM type 2  Vasoactive Medication No   Chemotherapy No   Other Yes Atrovastatin - hyperlipidemia Levothyroxine - hypothyroidism Trazodone - sleep  PRNs; Acetaminophen - mild pain or temp > 99.43F Melatonin - sleep Senna-docusate - constipation     Type of Medication Issue Identified Description of Issue Recommendation(s)  Drug Interaction(s) (clinically significant)     Duplicate Therapy     Allergy     No Medication Administration End Date     Incorrect Dose     Additional Drug Therapy Needed     Significant med changes from prior encounter (inform family/care partners about these prior to discharge). Off PTA losartan and metoprolol.   New Aspirin, clopidogrel, atorvastatin, trazodone. Permissive hypertension; resume if/when clinically warranted.  Communicate changes to patient/family prior to discharge. Clopidogrel stop time (3 weeks) in place.  Other       Clinically significant medication issues were identified that warrant physician communication and completion of prescribed/recommended actions by midnight of the next day:  No  Pharmacist comments:   - Plan aspirin and clopidogrel for 3 weeks, then aspirin alone.  Plavix begun 06/21/23 > last dose 07/11/23.    Time spent performing this drug regimen review (minutes):  20   Dennie Fetters, [place] 06/23/2023 12:57 PM

## 2023-06-23 NOTE — Progress Notes (Signed)
Inpatient Rehab Admissions Coordinator:  There is a bed available for pt in CIR today. Dr. Mariea Clonts is aware and in agreement. Pt, pt's wife, NSG, and TOC made aware.   Wolfgang Phoenix, MS, CCC-SLP Admissions Coordinator [phone]

## 2023-06-24 DIAGNOSIS — K5901 Slow transit constipation: Secondary | ICD-10-CM

## 2023-06-24 LAB — GLUCOSE, CAPILLARY
Glucose-Capillary: 133 mg/dL — ABNORMAL HIGH (ref 70–99)
Glucose-Capillary: 225 mg/dL — ABNORMAL HIGH (ref 70–99)
Glucose-Capillary: 151 mg/dL — ABNORMAL HIGH (ref 70–99)
Glucose-Capillary: 205 mg/dL — ABNORMAL HIGH (ref 70–99)

## 2023-06-24 LAB — COMPREHENSIVE METABOLIC PANEL WITH GFR
ALT: 17 U/L (ref 0–44)
AST: 22 U/L (ref 15–41)
Albumin: 3.6 g/dL (ref 3.5–5.0)
Alkaline Phosphatase: 66 U/L (ref 38–126)
Anion gap: 11 (ref 5–15)
BUN: 12 mg/dL (ref 8–23)
CO2: 26 mmol/L (ref 22–32)
Calcium: 9 mg/dL (ref 8.9–10.3)
Chloride: 103 mmol/L (ref 98–111)
Creatinine, Ser: 1.38 mg/dL — ABNORMAL HIGH (ref 0.61–1.24)
GFR, Estimated: 52 mL/min — ABNORMAL LOW (ref >60)
Glucose, Bld: 182 mg/dL — ABNORMAL HIGH (ref 70–99)
Potassium: 3.5 mmol/L (ref 3.5–5.1)
Sodium: 140 mmol/L (ref 135–145)
Total Bilirubin: 0.8 mg/dL (ref 0.3–1.2)
Total Protein: 6.7 g/dL (ref 6.5–8.1)

## 2023-06-24 LAB — CBC WITH DIFFERENTIAL/PLATELET
Abs Immature Granulocytes: 0.01 10*3/uL (ref 0.00–0.07)
Basophils Absolute: 0 10*3/uL (ref 0.0–0.1)
Basophils Relative: 0 %
Eosinophils Absolute: 0.3 10*3/uL (ref 0.0–0.5)
Eosinophils Relative: 4 %
HCT: 46.1 % (ref 39.0–52.0)
Hemoglobin: 15.5 g/dL (ref 13.0–17.0)
Immature Granulocytes: 0 %
Lymphocytes Relative: 40 %
Lymphs Abs: 2.7 10*3/uL (ref 0.7–4.0)
MCH: 30.3 pg (ref 26.0–34.0)
MCHC: 33.6 g/dL (ref 30.0–36.0)
MCV: 90.2 fL (ref 80.0–100.0)
Monocytes Absolute: 0.9 10*3/uL (ref 0.1–1.0)
Monocytes Relative: 13 %
Neutro Abs: 2.8 10*3/uL (ref 1.7–7.7)
Neutrophils Relative %: 43 %
Platelets: 193 10*3/uL (ref 150–400)
RBC: 5.11 MIL/uL (ref 4.22–5.81)
RDW: 12.7 % (ref 11.5–15.5)
WBC: 6.7 10*3/uL (ref 4.0–10.5)
nRBC: 0 % (ref 0.0–0.2)

## 2023-06-24 MED ORDER — DOCUSATE SODIUM 100 MG PO CAPS
100.0000 mg | ORAL_CAPSULE | Freq: Two times a day (BID) | ORAL | Status: DC
  Administered 2023-06-24 – 2023-06-25 (×3): 100 mg via ORAL
  Filled 2023-06-24 (×3): qty 1

## 2023-06-24 MED ORDER — POLYETHYLENE GLYCOL 3350 17 G PO PACK
17.0000 g | PACK | Freq: Every day | ORAL | Status: DC
  Administered 2023-06-24 – 2023-06-25 (×2): 17 g via ORAL
  Filled 2023-06-24 (×2): qty 1

## 2023-06-24 NOTE — Progress Notes (Signed)
PROGRESS NOTE   Subjective/Complaints:  Pt doing well today, slept well overnight. Has chronic back pain/sciatica, but feels he's doing well overall with pain control. Hasn't had a BM in 3-4 days per patient, which is normal for him, he doesn't take anything at home for constipation but is willing to try something here.  Use purewick overnight, willing to try urinal today. Denies any other complaints or concerns today.   ROS: as per HPI. Denies CP, SOB, abd pain, N/V/D, or any other complaints at this time.    Objective:   No results found. Recent Labs    06/24/23 0728  WBC 6.7  HGB 15.5  HCT 46.1  PLT 193   Recent Labs    06/24/23 0728  NA 140  K 3.5  CL 103  CO2 26  GLUCOSE 182*  BUN 12  CREATININE 1.38*  CALCIUM 9.0    Intake/Output Summary (Last 24 hours) at 06/24/2023 1044 Last data filed at 06/24/2023 0726 Gross per 24 hour  Intake 320 ml  Output 350 ml  Net -30 ml        Physical Exam: Vital Signs Blood pressure (!) 158/90, pulse 81, temperature 97.8 F (36.6 C), temperature source Oral, resp. rate 18, height 6' (1.829 m), weight 85.2 kg, SpO2 95%.  Constitutional:  Pt awake, alert, appropriate, sitting up in w/c; NAD  HENT: NCAT Cardiovascular: RRR, no m/r/g appreciated  Pulmonary: CTAB no w/r/r, no increased WOB Abdominal: mildly distended/protuberant, fairly normoactive BS this morning, nonTTP, no r/g/r, umbilical hernia noted but easily reducible and nonTTP Skin: warm, dry, intact Psychiatric: Flat affect  PRIOR EXAMS: Musculoskeletal:     Cervical back: Neck supple. No tenderness.     Comments: RUE 5/5 in biceps, triceps, WE< grip and FA LUE- 5-/5 at worst in all muscles RLE- 5-/5 esp notable in R HF LLE- 4+/5 in HF/KE/KF and 5-/5 in DF/PF  Skin:   Comments: Mild umbilical hernia noted R>L hand contractures- mainly PIPs notable PIPs flexed/contracted- 5th digit on R completely curled   Neurological:     Mental Status: He is alert.     Comments: Slightly delayed on responses, but asking appropriate questions Intact to light touch in all 4 extremities DTRs 1+ and no increased tone seen    Assessment/Plan: 1. Functional deficits which require 3+ hours per day of interdisciplinary therapy in a comprehensive inpatient rehab setting. Physiatrist is providing close team supervision and 24 hour management of active medical problems listed below. Physiatrist and rehab team continue to assess barriers to discharge/monitor patient progress toward functional and medical goals  Care Tool:  Bathing              Bathing assist       Upper Body Dressing/Undressing Upper body dressing        Upper body assist      Lower Body Dressing/Undressing Lower body dressing            Lower body assist       Toileting Toileting    Toileting assist       Transfers Chair/bed transfer  Transfers assist  Locomotion Ambulation   Ambulation assist              Walk 10 feet activity   Assist           Walk 50 feet activity   Assist           Walk 150 feet activity   Assist           Walk 10 feet on uneven surface  activity   Assist           Wheelchair     Assist               Wheelchair 50 feet with 2 turns activity    Assist            Wheelchair 150 feet activity     Assist          Blood pressure (!) 158/90, pulse 81, temperature 97.8 F (36.6 C), temperature source Oral, resp. rate 18, height 6' (1.829 m), weight 85.2 kg, SpO2 95%.  Medical Problem List and Plan: 1. Functional deficits secondary to R brain scattered strokes in watershed distribution with L hemiparesis             -patient may  shower             -ELOS/Goals: 10-14 days- supervision to mod I  -06/24/23 CIR evals today 2.  Antithrombotics: -DVT/anticoagulation:  Pharmaceutical: Lovenox 40mg  QD              -antiplatelet therapy: Plavix and ASA x 3 weeks then ASA alone 3. Pain Management: will add Tramadol 50 mg q6pm per pt request- was home dose- and tylenol prn 4. Mood/Behavior/Sleep: trazodone 100 mg at bedtime- is home dose             -antipsychotic agents: n/a  -Melatonin 3mg  at bedtime PRN 5. Neuropsych/cognition: This patient appears capable of making decisions on his own behalf. 6. Skin/Wound Care: routine skin care/prevention 7. Fluids/Electrolytes/Nutrition: monitor I/O and routine labs  -06/24/23 CMP unremarkable today other than stable Cr 1.38, monitor 8. CAD/HTN- had been on Metoprolol at home, however being held due to permissive HTN- can restart when needed  -06/24/23 BPs permissively elevated, monitor Vitals:   06/23/23 1230 06/23/23 1254 06/23/23 1954 06/24/23 0340  BP: (!) 150/93 (!) 150/93 (!) 164/80 (!) 158/90    9. CKD3A- baseline Cr 1.2-1.3, admission labs 06/24/23 at baseline, monitor 10. HLD- con't atorvastatin 40 mg daily 11. DM- insulin dependent- on insulin at home- A1c 8.8- con't SSI and Semglee 42 units at bedtime  -06/24/23 CBGs variable, will monitor for trend CBG (last 3)  Recent Labs    06/23/23 1631 06/23/23 2054 06/24/23 0557  GLUCAP 165* 212* 133*    12. Constipation- normal for pt to go q3-4 days at home  -06/24/23 still no BM x3-4d, will start miralax every day and colace 100mg  BID for now; also has senokot 1 tab QHS PRN; monitor 13. Hypothyroidism- con't synthroid daily.  14. Grade 1 diastolic dysfunction- con't to monitor weights. Will add Daily weights if needed Filed Weights   06/23/23 1230  Weight: 85.2 kg    15. Purewick- will stop usage today, pt will attempt urinal    LOS: 1 days A FACE TO FACE EVALUATION WAS PERFORMED  [address] 06/24/2023, 10:44 AM

## 2023-06-24 NOTE — Plan of Care (Signed)
  Problem: RH Balance Goal: LTG Patient will maintain dynamic sitting balance (PT) Description: LTG:  Patient will maintain dynamic sitting balance with assistance during mobility activities (PT) Flowsheets (Taken 06/24/2023 1605) LTG: Pt will maintain dynamic sitting balance during mobility activities with:: Supervision/Verbal cueing Goal: LTG Patient will maintain dynamic standing balance (PT) Description: LTG:  Patient will maintain dynamic standing balance with assistance during mobility activities (PT) Flowsheets (Taken 06/24/2023 1605) LTG: Pt will maintain dynamic standing balance during mobility activities with:: Supervision/Verbal cueing   Problem: Sit to Stand Goal: LTG:  Patient will perform sit to stand with assistance level (PT) Description: LTG:  Patient will perform sit to stand with assistance level (PT) Flowsheets (Taken 06/24/2023 1605) LTG: PT will perform sit to stand in preparation for functional mobility with assistance level: Supervision/Verbal cueing   Problem: RH Bed Mobility Goal: LTG Patient will perform bed mobility with assist (PT) Description: LTG: Patient will perform bed mobility with assistance, with/without cues (PT). Flowsheets (Taken 06/24/2023 1605) LTG: Pt will perform bed mobility with assistance level of: Independent with assistive device    Problem: RH Bed to Chair Transfers Goal: LTG Patient will perform bed/chair transfers w/assist (PT) Description: LTG: Patient will perform bed to chair transfers with assistance (PT). Flowsheets (Taken 06/24/2023 1605) LTG: Pt will perform Bed to Chair Transfers with assistance level: Supervision/Verbal cueing   Problem: RH Car Transfers Goal: LTG Patient will perform car transfers with assist (PT) Description: LTG: Patient will perform car transfers with assistance (PT). Flowsheets (Taken 06/24/2023 1605) LTG: Pt will perform car transfers with assist:: Set up assist    Problem: RH Ambulation Goal: LTG Patient  will ambulate in controlled environment (PT) Description: LTG: Patient will ambulate in a controlled environment, # of feet with assistance (PT). Flowsheets (Taken 06/24/2023 1605) LTG: Pt will ambulate in controlled environ  assist needed:: Supervision/Verbal cueing LTG: Ambulation distance in controlled environment: 150 Goal: LTG Patient will ambulate in home environment (PT) Description: LTG: Patient will ambulate in home environment, # of feet with assistance (PT). Flowsheets (Taken 06/24/2023 1605) LTG: Pt will ambulate in home environ  assist needed:: Supervision/Verbal cueing LTG: Ambulation distance in home environment: 75 feet with LRAD   Problem: RH Wheelchair Mobility Goal: LTG Patient will propel w/c in controlled environment (PT) Description: LTG: Patient will propel wheelchair in controlled environment, # of feet with assist (PT) Flowsheets (Taken 06/24/2023 1605) LTG: Pt will propel w/c in controlled environ  assist needed:: Independent with assistive device LTG: Propel w/c distance in controlled environment: 150   Problem: RH Stairs Goal: LTG Patient will ambulate up and down stairs w/assist (PT) Description: LTG: Patient will ambulate up and down # of stairs with assistance (PT) Flowsheets (Taken 06/24/2023 1605) LTG: Pt will ambulate up/down stairs assist needed:: Contact Guard/Touching assist LTG: Pt will  ambulate up and down number of stairs: 3 steps with LRAD per home set up

## 2023-06-24 NOTE — Progress Notes (Signed)
Occupational Therapy Session Note  Patient Details  Name: [name] MRN: [id number] Date of Birth: 1946/02/28  {CHL IP REHAB OT TIME CALCULATIONS:[id number]}   Short Term Goals: Week 1:  OT Short Term Goal 1 (Week 1): The pt will complete LB dressing with MinA  @ 95% safe incorporating AE as needed OT Short Term Goal 2 (Week 1): The patient will complete tub/shower unit transfer with MinA @ 95% safe using the grab bars for addiitonal balance. OT Short Term Goal 3 (Week 1): The pt will complete sit to stand using the RW with MinA at 95% safe. OT Short Term Goal 4 (Week 1): The pt will be demonstrate the use of AE, for LB task performance after initial demonstrate and v/c's with effective carryover at 95% safe. OT Short Term Goal 5 (Week 1): The pt will improve UB strength to 5/5 MMT for safe and Ind functional with BADL related task at 95% safe.  Skilled Therapeutic Interventions/Progress Updates:    Patient agreeable to participate in OT session. Reports *** pain level.   Patient participated in skilled OT session focusing on ***. Therapist facilitated/assessed/developed/educated/integrated/elicited *** in order to improve/facilitate/promote    Therapy Documentation Precautions:  Precautions Precautions: Fall Precaution Comments: L Hemi Restrictions Weight Bearing Restrictions: No   Therapy/Group: Individual Therapy  Limmie Patricia, OTR/L,CBIS  Supplemental OT - [facility] and [facility] Secure Chat Preferred   06/24/2023, 7:59 PM

## 2023-06-24 NOTE — Evaluation (Signed)
Occupational Therapy Assessment and Plan  Patient Details  Name: Hunter Sparks MRN: [id number] Date of Birth: 28-Jun-1946  OT Diagnosis: muscle weakness (generalized) , Hemiplegia Rehab Potential:   ELOS:     Today's Date: 06/24/2023 OT Individual Time: 7829-5621 OT Individual Time Calculation (min): 60 min     Hospital Problem: Principal Problem:   Cerebral ischemic stroke due to global hypoperfusion with watershed infarct [facility]) Active Problems:   Stroke (cerebrum) (HCC)   Past Medical History:  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HCC)    Arthritis    "back, hips, legs" (06/10/2015)   Chest pain    Chronic lower back pain    Depression    DJD (degenerative joint disease)    Hyperlipidemia 06/11/2015   Hypertension    Hypothyroidism    Kidney stone    Kidney stone    Type II diabetes mellitus (HCC)    Past Surgical History:  Past Surgical History:  Procedure Laterality Date   CARDIAC CATHETERIZATION N/A 06/11/2015   Procedure: Left Heart Cath and Coronary Angiography;  Surgeon: Lyn Records, MD;  Location: [facility] INVASIVE CV LAB;  Service: Cardiovascular;  Laterality: N/A;   COLONOSCOPY  06/21/2011   Procedure: COLONOSCOPY;  Surgeon: Malissa Hippo, MD;  Location: [facility] ENDO SUITE;  Service: Endoscopy;  Laterality: N/A;   COLONOSCOPY     COLONOSCOPY N/A 09/20/2017   Procedure: COLONOSCOPY;  Surgeon: Malissa Hippo, MD;  Location: [facility];  Service: Endoscopy;  Laterality: N/A;  1200   CORONARY ARTERY BYPASS GRAFT N/A 06/14/2015   Procedure: CORONARY ARTERY BYPASS GRAFTING times four using Left Internal mammary artery and right leg Saphenous vein graft;  Surgeon: Kerin Perna, MD;  Location: [facility] OR;  Service: Open Heart Surgery;  Laterality: N/A;   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   HAND SURGERY     SKIN CANCER EXCISION Left    "forearm"   TEE WITHOUT CARDIOVERSION N/A 06/14/2015   Procedure: TRANSESOPHAGEAL ECHOCARDIOGRAM (TEE);   Surgeon: Kerin Perna, MD;  Location: [facility] OR;  Service: Open Heart Surgery;  Laterality: N/A;    Assessment & Plan Clinical Impression: Patient is a 77 y.o. year old male with history of HTN, HLD, insulin dependent DM with A1c of 8.8, CAD, CKD3A, BPH And hypothyroidism, admitted 06/21/2023 after found to have slurred speech and L hemiparesis. Patient had Stroke work up and MRI showed scattered small infarcts in Right Basal ganglia, frontal, parietal, occipital lobes- MCA/PCA watershed distributions. NIHSS  of 3. His LDL is 165 and HDL 33, A1c found to be 8.8; EF 70%>- and grade 1 diastolic dysfunction.  Patient transferred to CIR on 06/23/2023 .    Patient currently requires Min/Mod A with LB task performance for basic self-care skills  in dressing secondary to muscle weakness and ataxia. The pt presented with challenges in relation functional transfers for  toileting , tub/shower unit , and when coming from sit to stand with  performance currently at   Mod to MaxA.  The pt presents with  some structural changes with bilateral hand secondary to nerve damage impacting how he approaches his environment, I believe the pt would benefit from skilled instruction in task modification and AE to improve his Ind, further, reducing the burden of care for others.   Prior to  recent hospitalization, patient is reported as Ind with BADL and IADL related task performance, he presents with a willingness to participate and would benefit from inpatient  OT services 2-3wks in duration to maximize his rehab potential.   Patient will benefit from skilled intervention to decrease level of assist with basic self-care skills prior to discharge home with care partner.  Anticipate patient will require 24 hour supervision and  HH to be determined .      OT Evaluation Precautions/Restrictions  Precautions Precautions: Fall Precaution Comments: L Hemi Restrictions Weight Bearing Restrictions: No General Chart Reviewed:  Yes Family/Caregiver Present: No Vital Signs Therapy Vitals Pulse Rate: 94 BP: (!) 150/85 Patient Position (if appropriate): Sitting Oxygen Therapy SpO2: 97 % O2 Device: Room Air Pain Pain Assessment Pain Scale: 0-10 Pain Score: 6  Pain Type: Other (Comment) Pain Location: Other (Comment) Pain Orientation: Left (Sciatic Nerve Pain) Home Living/Prior Functioning Home Living Family/patient expects to be discharged to:: Private residence Living Arrangements: Spouse/significant other Available Help at Discharge: Family, Available 24 hours/day Type of Home: House Home Access: Stairs to enter Secretary/administrator of Steps: 3 Entrance Stairs-Rails: Left Home Layout: One level Bathroom Shower/Tub: Engineer, manufacturing systems: Handicapped height Bathroom Accessibility: Yes  Lives With: Spouse IADL History Occupation: Retired Type of Occupation: Engineer, drilling Prior Function Level of Independence: Independent with gait, Independent with basic ADLs, Independent with transfers, Independent with homemaking with ambulation  Able to Take Stairs?: Yes Driving: Yes Vocation: Retired Administrator, sports Baseline Vision/History: 1 Wears glasses (readers) Ability to See in Adequate Light: 0 Adequate Patient Visual Report: No change from baseline Vision Assessment?: No apparent visual deficits Perception  Perception: Within Functional Limits Praxis Praxis: Impaired Praxis Impairment Details: Organization Cognition Cognition Overall Cognitive Status: Within Functional Limits for tasks assessed Arousal/Alertness: Awake/alert Orientation Level: Person;Place;Situation Person: Oriented Place: Oriented Situation: Oriented Memory: Impaired Memory Impairment: Decreased short term memory;Retrieval deficit;Storage deficit Awareness: Appears intact Problem Solving: Appears intact Safety/Judgment: Impaired Comments: Patient attempted to get up on several occasions and was too weak to  maintain balance Brief Interview for Mental Status (BIMS) Repetition of Three Words (First Attempt): 1 Temporal Orientation: Year: Correct Temporal Orientation: Month: Accurate within 5 days Temporal Orientation: Day: Correct Recall: "Sock": No, could not recall Recall: "Blue": Yes, after cueing ("a color") Recall: "Bed": No, could not recall BIMS Summary Score: 99 Sensation Sensation Light Touch: Appears Intact Coordination Gross Motor Movements are Fluid and Coordinated: No Fine Motor Movements are Fluid and Coordinated: No Coordination and Movement Description: impaired Motor  Motor Motor: Hemiplegia Motor - Skilled Clinical Observations: contractures associated with bilateral hands impacting coordination and manipulation  during task performance. Motor - Discharge Observations: L LE hemiplegia  Trunk/Postural Assessment  Cervical Assessment Cervical Assessment: Within Functional Limits Thoracic Assessment Thoracic Assessment: Within Functional Limits Postural Control Postural Control: Deficits on evaluation Trunk Control: lateral trunk lean to L  Balance Static Sitting Balance Static Sitting - Level of Assistance: 4: Min assist;3: Mod assist Dynamic Sitting Balance Dynamic Sitting - Level of Assistance: 3: Mod assist;4: Min assist Extremity/Trunk Assessment      Care Tool Care Tool Self Care Eating   Eating Assist Level: Set up assist    Oral Care    Oral Care Assist Level: Set up assist    Bathing   Body parts bathed by patient: Right arm;Left arm;Chest;Abdomen;Front perineal area;Buttocks;Right upper leg;Face;Left upper leg;Right lower leg;Left lower leg     Assist Level: Contact Guard/Touching assist (CGA to MinA)    Upper Body Dressing(including orthotics)   What is the patient wearing?: Pull over shirt   Assist Level: Set up assist    Lower Body Dressing (  excluding footwear)   What is the patient wearing?: Pants Assist for lower body dressing:  Contact Guard/Touching assist (CGA to MinA secondary to challenges with functional balance)    Putting on/Taking off footwear   What is the patient wearing?: Non-skid slipper socks Assist for footwear: Minimal Assistance - Patient > 75%       Care Tool Toileting Toileting activity   Assist for toileting: Supervision/Verbal cueing (S without cues)     Care Tool Bed Mobility Roll left and right activity   Roll left and right assist level: Supervision/Verbal cueing    Sit to lying activity   Sit to lying assist level: Supervision/Verbal cueing    Lying to sitting on side of bed activity   Lying to sitting on side of bed assist level: the ability to move from lying on the back to sitting on the side of the bed with no back support.: Supervision/Verbal cueing     Care Tool Transfers Sit to stand transfer   Sit to stand assist level: Maximal Assistance - Patient 25 - 49%    Chair/bed transfer   Chair/bed transfer assist level: Maximal Assistance - Patient 25 - 49%     Toilet transfer   Assist Level: Moderate Assistance - Patient 50 - 74%     Care Tool Cognition  Expression of Ideas and Wants Expression of Ideas and Wants: 4. Without difficulty (complex and basic) - expresses complex messages without difficulty and with speech that is clear and easy to understand  Understanding Verbal and Non-Verbal Content     Memory/Recall Ability     Refer to Care Plan for Long Term Goals  SHORT TERM GOAL WEEK 1 OT Short Term Goal 1 (Week 1): The pt will complete LB dressing with MinA  @ 95% safe incorporating AE as needed OT Short Term Goal 2 (Week 1): The patient will complete tub/shower unit transfer with MinA @ 95% safe using the grab bars for addiitonal balance. OT Short Term Goal 3 (Week 1): The pt will complete sit to stand using the RW with MinA at 95% safe. OT Short Term Goal 4 (Week 1): The pt will be demonstrate the use of AE, for LB task performance after initial demonstrate and  v/c's with effective carryover at 95% safe. OT Short Term Goal 5 (Week 1): The pt will improve UB strength to 5/5 MMT for safe and Ind functional with BADL related task at 95% safe.  Recommendations for other services: None    Skilled Therapeutic Intervention  Patient was seen this day for skilled OT evaluation to improve his functional outcome and reduce the burden of care for others.  The pt presents with a good disposition and a willingness to participate.  The pt was able to transfer from  supine to EOB with ModA  incorporating the bed rails. The pt completed a simulated  task in bathing with s/u assist for UB and ModA for LB secondary compromise static/dynamic sit balance. Patient  was able to go from EOB to w/c LOF with ModA. The pt presents with  structural damage associated with bilateral hands secondary to nerve damage impacting how he approach his natural environment safely and independently.  In my opinion the pt would benefit from  instruction in functional transfers, UB strengthening, instruction in task modification, and in the use of AE.  I recommend OT service 2-3 weeks  in duration for transitioning to home with [facility] services determined at a later date.  Mobility  Bed Mobility Bed Mobility: Supine to Sit Rolling Right: Minimal Assistance - Patient > 75%;Moderate Assistance - Patient 50-74% Supine to Sit: Moderate Assistance - Patient 50-74% Transfers Sit to Stand: Moderate Assistance - Patient 50-74% Stand to Sit: Moderate Assistance - Patient 50-74%   Discharge Criteria: Patient will be discharged from OT if patient refuses treatment 3 consecutive times without medical reason, if treatment goals not met, if there is a change in medical status, if patient makes no progress towards goals or if patient is discharged from hospital.  The above assessment, treatment plan, treatment alternatives and goals were discussed and mutually agreed upon: by patient  Lavona Mound 06/24/2023, 6:07 PM

## 2023-06-24 NOTE — Evaluation (Signed)
Physical Therapy Assessment and Plan  Patient Details  Name: Hunter Sparks MRN: [id number] Date of Birth: 06-23-1946  PT Diagnosis: Abnormal posture, Abnormality of gait, Cognitive deficits, Difficulty walking, Impaired cognition, Impaired sensation, and Muscle weakness Rehab Potential: Fair ELOS: 2-3 weeks   Today's Date: 06/24/2023 PT Individual Time: 0800-0913 PT Individual Time Calculation (min): 73 min    Hospital Problem: Principal Problem:   Cerebral ischemic stroke due to global hypoperfusion with watershed infarct [facility]) Active Problems:   Stroke (cerebrum) (HCC)   Past Medical History:  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HCC)    Arthritis    "back, hips, legs" (06/10/2015)   Chest pain    Chronic lower back pain    Depression    DJD (degenerative joint disease)    Hyperlipidemia 06/11/2015   Hypertension    Hypothyroidism    Kidney stone    Kidney stone    Type II diabetes mellitus (HCC)    Past Surgical History:  Past Surgical History:  Procedure Laterality Date   CARDIAC CATHETERIZATION N/A 06/11/2015   Procedure: Left Heart Cath and Coronary Angiography;  Surgeon: Lyn Records, MD;  Location: [facility] INVASIVE CV LAB;  Service: Cardiovascular;  Laterality: N/A;   COLONOSCOPY  06/21/2011   Procedure: COLONOSCOPY;  Surgeon: Malissa Hippo, MD;  [facility];  Service: Endoscopy;  Laterality: N/A;   COLONOSCOPY     COLONOSCOPY N/A 09/20/2017   Procedure: COLONOSCOPY;  Surgeon: Malissa Hippo, MD;  Location: [facility] ENDO SUITE;  Service: Endoscopy;  Laterality: N/A;  1200   CORONARY ARTERY BYPASS GRAFT N/A 06/14/2015   Procedure: CORONARY ARTERY BYPASS GRAFTING times four using Left Internal mammary artery and right leg Saphenous vein graft;  Surgeon: Kerin Perna, MD;  Location: [facility] OR;  Service: Open Heart Surgery;  Laterality: N/A;   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   HAND SURGERY     SKIN CANCER EXCISION Left     "forearm"   TEE WITHOUT CARDIOVERSION N/A 06/14/2015   Procedure: TRANSESOPHAGEAL ECHOCARDIOGRAM (TEE);  Surgeon: Kerin Perna, MD;  Location: [facility] OR;  Service: Open Heart Surgery;  Laterality: N/A;    Assessment & Plan Clinical Impression: Patient is a 77 y.o. year old male with history of HTN, HLD, insulin dependent DM with A1c of 8.8, CAD, CKD3A, BPH And hypothyroidism, admitted 06/21/2023 after found to have slurred speech and L hemiparesis. Patient had Stroke work up and MRI showed scattered small infarcts in Right Basal ganglia, frontal, parietal, occipital lobes- MCA/PCA watershed distributions. NIHSS  of 3. His LDL is 165 and HDL 33, A1c found to be 8.8; EF 70%>- and grade 1 diastolic dysfunction.     Patient currently requires mod with mobility secondary to muscle weakness, decreased cardiorespiratoy endurance, decreased midline orientation, decreased safety awareness, decreased memory, and delayed processing, and decreased sitting balance, decreased standing balance, decreased postural control, hemiplegia, and decreased balance strategies.  Prior to hospitalization, patient was independent  with mobility and lived with Spouse in a House home.  Home access is 3Stairs to enter.  Patient will benefit from skilled PT intervention to maximize safe functional mobility, minimize fall risk, and decrease caregiver burden for planned discharge home with 24 hour assist.  Anticipate patient will benefit from follow up [facility] at discharge.  PT - End of Session Activity Tolerance: Tolerates 30+ min activity with multiple rests Endurance Deficit: Yes PT Assessment Rehab Potential (ACUTE/IP ONLY): Fair PT Barriers to Discharge:  Inaccessible home environment;Home environment access/layout;Decreased caregiver support;Behavior;Lack of/limited family support PT Patient demonstrates impairments in the following area(s): Balance;Behavior;Edema;Endurance;Motor;Safety PT Transfers Functional Problem(s): Bed  Mobility;Bed to Chair;Car PT Locomotion Functional Problem(s): Ambulation;Wheelchair Mobility;Stairs PT Plan PT Intensity: Minimum of 1-2 x/day ,45 to 90 minutes PT Frequency: 5 out of 7 days PT Duration Estimated Length of Stay: 2-3 weeks PT Treatment/Interventions: Ambulation/gait training;Discharge planning;Functional mobility training;Psychosocial support;Therapeutic Activities;Visual/perceptual remediation/compensation;Balance/vestibular training;Disease management/prevention;Neuromuscular re-education;Skin care/wound management;Therapeutic Exercise;Wheelchair propulsion/positioning;Cognitive remediation/compensation;DME/adaptive equipment instruction;Pain management;Splinting/orthotics;UE/LE Strength taining/ROM;Community reintegration;Functional electrical stimulation;Patient/family education;Stair training;UE/LE Coordination activities PT Transfers Anticipated Outcome(s): supervision PT Locomotion Anticipated Outcome(s): supervision PT Recommendation Follow Up Recommendations: Home health PT Patient destination: Home Equipment Recommended: To be determined   PT Evaluation Precautions/Restrictions Precautions Precautions: Fall Precaution Comments: L Hemi Restrictions Weight Bearing Restrictions: No Pain Interference Pain Interference Pain Effect on Sleep: 1. Rarely or not at all Pain Interference with Therapy Activities: 1. Rarely or not at all Pain Interference with Day-to-Day Activities: 1. Rarely or not at all Home Living/Prior Functioning Home Living Available Help at Discharge: Family;Available 24 hours/day Type of Home: House Home Access: Stairs to enter [employer] of Steps: 3 Entrance Stairs-Rails: Left Home Layout: One level Bathroom Shower/Tub: Engineer, manufacturing systems: Handicapped height Bathroom Accessibility: Yes  Lives With: Spouse Prior Function Level of Independence: Independent with gait;Independent with basic ADLs;Independent with  transfers;Independent with homemaking with ambulation  Able to Take Stairs?: Yes Driving: Yes Vocation: Retired Vision/Perception  Vision - History Ability to See in Adequate Light: 0 Adequate Perception Perception: Within Functional Limits Praxis Praxis: Impaired Praxis Impairment Details: Organization  Cognition Overall Cognitive Status: Within Functional Limits for tasks assessed Arousal/Alertness: Awake/alert Orientation Level: Oriented to person;Disoriented to time;Oriented to situation;Oriented to place Year: 2025 Month:  (pt said "I dont know") Day of Week: Incorrect Memory: Impaired Memory Impairment: Decreased short term memory;Retrieval deficit;Storage deficit Awareness: Appears intact Problem Solving: Appears intact Safety/Judgment: Impaired Comments: Patient attempted to get up on several occasions and was too weak to maintain balance Sensation Sensation Light Touch: Appears Intact Coordination Gross Motor Movements are Fluid and Coordinated: No Fine Motor Movements are Fluid and Coordinated: No Coordination and Movement Description: impaired Finger Nose Finger Test: decreased velocity Heel Shin Test: imapired 2/2 weakness Motor  Motor Motor: Hemiplegia Motor - Skilled Clinical Observations: contractures associated with bilateral hands impacting coordination and manipulation  during task performance. Motor - Discharge Observations: L LE hemiplegia  Trunk/Postural Assessment  Cervical Assessment Cervical Assessment: Within Functional Limits Thoracic Assessment Thoracic Assessment: Within Functional Limits Postural Control Postural Control: Deficits on evaluation Trunk Control: lateral trunk lean to L Righting Reactions: delayed Protective Responses: delayed  Balance Balance Balance Assessed: Yes Static Sitting Balance Static Sitting - Balance Support: Bilateral upper extremity supported;Feet supported Static Sitting - Level of Assistance: 4: Min  assist Dynamic Sitting Balance Dynamic Sitting - Balance Support: Bilateral upper extremity supported;During functional activity Dynamic Sitting - Level of Assistance: 3: Mod assist;4: Min assist Sitting balance - Comments: fair with v/c's to self correct incorporating the bed rail Static Standing Balance Static Standing - Balance Support: Bilateral upper extremity supported Static Standing - Level of Assistance: 4: Min assist Dynamic Standing Balance Dynamic Standing - Balance Support: Bilateral upper extremity supported Dynamic Standing - Level of Assistance: 3: Mod assist Dynamic Standing - Comments: gait-2/2 L lateral trunk lean and L LE instability Extremity Assessment  RUE Assessment Passive Range of Motion (PROM) Comments: WFL Active Range of Motion (AROM) Comments: WF; General Strength Comments: 4+/5 MMT LUE Assessment  Passive Range of Motion (PROM) Comments: WFL Active Range of Motion (AROM) Comments: WFL General Strength Comments: 4+/5MMT RLE Assessment RLE Assessment: Exceptions to [facility] General Strength Comments: grossly 3+/5 LLE Assessment LLE Assessment: Within Functional Limits  Care Tool Care Tool Bed Mobility Roll left and right activity   Roll left and right assist level: Supervision/Verbal cueing    Sit to lying activity   Sit to lying assist level: Supervision/Verbal cueing    Lying to sitting on side of bed activity   Lying to sitting on side of bed assist level: the ability to move from lying on the back to sitting on the side of the bed with no back support.: Supervision/Verbal cueing     Care Tool Transfers Sit to stand transfer   Sit to stand assist level: Maximal Assistance - Patient 25 - 49%    Chair/bed transfer   Chair/bed transfer assist level: Dependent - mechanical lift (steady)     Scientist, research (physical sciences) transfer activity did not occur: Safety/medical concerns        Care Tool Locomotion Ambulation   Assist level:  Moderate Assistance - Patient 50 - 74% Assistive device: Walker-rolling Max distance: 15  Walk 10 feet activity   Assist level: Moderate Assistance - Patient - 50 - 74% Assistive device: Walker-rolling   Walk 50 feet with 2 turns activity        Walk 150 feet activity   Assist level: Moderate Assistance - Patient - 50 - 74% Assistive device: Walker-rolling  Walk 10 feet on uneven surfaces activity Walk 10 feet on uneven surfaces activity did not occur: Safety/medical concerns      Stairs   Assist level: Moderate Assistance - Patient - 50 - 74% Stairs assistive device: 2 hand rails Max number of stairs: 4  Walk up/down 1 step activity   Walk up/down 1 step (curb) assist level: Moderate Assistance - Patient - 50 - 74% Walk up/down 1 step or curb assistive device: 2 hand rails  Walk up/down 4 steps activity   Walk up/down 4 steps assist level: Moderate Assistance - Patient - 50 - 74% Walk up/down 4 steps assistive device: 2 hand rails  Walk up/down 12 steps activity   Walk up/down 12 steps assist level: Moderate Assistance - Patient - 50 - 74% Walk up/down 12 steps assistive device: 2 hand rails  Pick up small objects from floor   Pick up small object from the floor assist level: Total Assistance - Patient < 25%    Wheelchair Is the patient using a wheelchair?: Yes Type of Wheelchair: Manual   Wheelchair assist level: Supervision/Verbal cueing Max wheelchair distance: 150  Wheel 50 feet with 2 turns activity   Assist Level: Supervision/Verbal cueing  Wheel 150 feet activity   Assist Level: Supervision/Verbal cueing    Refer to Care Plan for Long Term Goals  SHORT TERM GOAL WEEK 1 PT Short Term Goal 1 (Week 1): pt will maintain dynamic seated balance with min A PT Short Term Goal 2 (Week 1): Pt will perform bed to chair transfer with LRAD and min A PT Short Term Goal 3 (Week 1): pt will ambulate 50 feet with LRAD and CGA  Recommendations for other services: None    Skilled Therapeutic Intervention Mobility Bed Mobility Bed Mobility: Rolling Right;Rolling Left;Supine to Sit;Sit to Supine Rolling Right: Supervision/verbal cueing Rolling Left: Supervision/Verbal cueing Supine to Sit: Supervision/Verbal cueing Sit to Supine: Supervision/Verbal cueing Transfers Transfers:  Sit to Stand;Stand to Sit;Stand Pivot Transfers Sit to Stand: Maximal Assistance - Patient 25-49%;Moderate Assistance - Patient 50-74% Stand to Sit: Moderate Assistance - Patient 50-74% Stand Pivot Transfers: Moderate Assistance - Patient 50 - 74% (mod A with RW) Stand Pivot Transfer Details: Verbal cues for precautions/safety;Verbal cues for safe use of DME/AE Transfer (Assistive device): Rolling walker Locomotion  Gait Ambulation: Yes Gait Assistance: Moderate Assistance - Patient 50-74% Gait Distance (Feet): 15 Feet Assistive device: Rolling walker Gait Assistance Details: Verbal cues for sequencing;Verbal cues for safe use of DME/AE;Verbal cues for precautions/safety Gait Gait: Yes Gait Pattern: Impaired Gait Pattern: Wide base of support;Trunk flexed;Step-to pattern;Lateral trunk lean to left Gait velocity: slow Stairs / Additional Locomotion Stairs: Yes Stairs Assistance: Moderate Assistance - Patient 50 - 74% Stair Management Technique: Two rails Number of Stairs: 4 Height of Stairs: 6 Wheelchair Mobility Wheelchair Mobility: Yes Wheelchair Assistance: Doctor, general practice: Both upper extremities Wheelchair Parts Management: Needs assistance   Discharge Criteria: Patient will be discharged from PT if patient refuses treatment 3 consecutive times without medical reason, if treatment goals not met, if there is a change in medical status, if patient makes no progress towards goals or if patient is discharged from hospital.  The above assessment, treatment plan, treatment alternatives and goals were discussed and mutually agreed upon: by  patient  Today's interventions   Evaluation completed (see details above and below) with education on PT POC and goals and individual treatment initiated with focus on transfer training. Education on PT evaluation, CIR policies and therapy schedule.   Pt demos significant L lateral trunk lean with seated and standing balance. Pt performed unable to fully standing without and AD. Pt requires mod A for sit<>stand, stand pivot transfer with RW for L lateral trunk lean.  Pt requires L LE stabilization 2/2 buckling.   Pt ambulated 15 feet with RW and mod A, verbal cues provided for safety with RW. Pt ascended/descended 4 6 inch steps with B UE support on handrails and therapist stabilizing L LE 2/2 buckling. Pt required seated rest break at top of stairs.   Pt seated in WC at end of session with all needs within reach and seatbelt alarm on.    [facility] [place], [place], DPT  06/24/2023, 3:54 PM

## 2023-06-25 ENCOUNTER — Inpatient Hospital Stay (HOSPITAL_COMMUNITY): Payer: HMO

## 2023-06-25 DIAGNOSIS — I639 Cerebral infarction, unspecified: Secondary | ICD-10-CM | POA: Diagnosis not present

## 2023-06-25 LAB — GLUCOSE, CAPILLARY
Glucose-Capillary: 142 mg/dL — ABNORMAL HIGH (ref 70–99)
Glucose-Capillary: 233 mg/dL — ABNORMAL HIGH (ref 70–99)
Glucose-Capillary: 144 mg/dL — ABNORMAL HIGH (ref 70–99)
Glucose-Capillary: 206 mg/dL — ABNORMAL HIGH (ref 70–99)

## 2023-06-25 MED ORDER — SENNOSIDES-DOCUSATE SODIUM 8.6-50 MG PO TABS
1.0000 | ORAL_TABLET | Freq: Two times a day (BID) | ORAL | Status: DC
  Administered 2023-06-25 – 2023-06-26 (×4): 1 via ORAL
  Filled 2023-06-25 (×5): qty 1

## 2023-06-25 MED ORDER — POLYETHYLENE GLYCOL 3350 17 G PO PACK
17.0000 g | PACK | Freq: Two times a day (BID) | ORAL | Status: DC
  Administered 2023-06-25 – 2023-07-01 (×12): 17 g via ORAL
  Filled 2023-06-25 (×12): qty 1

## 2023-06-25 NOTE — Progress Notes (Signed)
PROGRESS NOTE   Subjective/Complaints:  No acute complaints.  No events overnight.  Labs from yesterday stable. Vitals with mild hypertension into the 150s, otherwise stable. Last bowel movement 8/11, small; wife reports last adequate bowel movement was last Wednesday Incontinent of urine, patient and wife both agree that he is able to sense the need to go but is unable to control it.  Multiple episodes of bedwetting overnight.  Endorses that he has been getting up to bedside to use urinal, has not been lying down.  ROS: as per HPI. Denies CP, SOB, abd pain, N/V/D, or any other complaints at this time.    Objective:   No results found. Recent Labs    06/24/23 0728  WBC 6.7  HGB 15.5  HCT 46.1  PLT 193   Recent Labs    06/24/23 0728  NA 140  K 3.5  CL 103  CO2 26  GLUCOSE 182*  BUN 12  CREATININE 1.38*  CALCIUM 9.0    Intake/Output Summary (Last 24 hours) at 06/25/2023 0856 Last data filed at 06/25/2023 0700 Gross per 24 hour  Intake 597 ml  Output 225 ml  Net 372 ml        Physical Exam: Vital Signs Blood pressure 136/64, pulse 91, temperature 98 F (36.7 C), temperature source Oral, resp. rate 18, height 6' (1.829 m), weight 85.2 kg, SpO2 95%.  Constitutional:  Pt awake, alert, appropriate, sitting up in w/c; working with therapies.  No acute distress. HENT: NCAT Cardiovascular: RRR, no m/r/g appreciated  Pulmonary: CTAB no w/r/r, no increased WOB Abdominal: mildly distended/protuberant, fairly normoactive BS this morning, nonTTP, no r/g/r, umbilical hernia noted but easily reducible and nonTTP- -unchanged Skin: warm, dry, intact Psychiatric: Flat affect  PRIOR EXAMS: Musculoskeletal:     Cervical back: Neck supple. No tenderness.     Comments: RUE 5/5 in biceps, triceps, WE< grip and FA LUE- 5-/5 at worst in all muscles RLE- 5-/5 esp notable in R HF LLE- 4+/5 in HF/KE/KF and 5-/5 in DF/PF   Skin:   Comments: Mild umbilical hernia noted R>L hand contractures- mainly PIPs notable PIPs flexed/contracted- 5th digit on R completely curled  Neurological:     Mental Status: He is alert.     Comments: Slightly delayed on responses, but asking appropriate questions Intact to light touch in all 4 extremities DTRs 1+ and no increased tone seen    Assessment/Plan: 1. Functional deficits which require 3+ hours per day of interdisciplinary therapy in a comprehensive inpatient rehab setting. Physiatrist is providing close team supervision and 24 hour management of active medical problems listed below. Physiatrist and rehab team continue to assess barriers to discharge/monitor patient progress toward functional and medical goals  Care Tool:  Bathing    Body parts bathed by patient: Right arm, Left arm, Chest, Abdomen, Front perineal area, Buttocks, Right upper leg, Face, Left upper leg, Right lower leg, Left lower leg         Bathing assist Assist Level: Contact Guard/Touching assist (CGA to MinA)     Upper Body Dressing/Undressing Upper body dressing   What is the patient wearing?: Pull over shirt    Upper body assist  Assist Level: Set up assist    Lower Body Dressing/Undressing Lower body dressing      What is the patient wearing?: Pants     Lower body assist Assist for lower body dressing: Contact Guard/Touching assist (CGA to MinA secondary to challenges with functional balance)     Toileting Toileting    Toileting assist Assist for toileting: Supervision/Verbal cueing (S without cues)     Transfers Chair/bed transfer  Transfers assist     Chair/bed transfer assist level: Maximal Assistance - Patient 25 - 49%     Locomotion Ambulation   Ambulation assist      Assist level: Moderate Assistance - Patient 50 - 74% Assistive device: Walker-rolling Max distance: 15   Walk 10 feet activity   Assist     Assist level: Moderate Assistance -  Patient - 50 - 74% Assistive device: Walker-rolling   Walk 50 feet activity   Assist           Walk 150 feet activity   Assist    Assist level: Moderate Assistance - Patient - 50 - 74% Assistive device: Walker-rolling    Walk 10 feet on uneven surface  activity   Assist Walk 10 feet on uneven surfaces activity did not occur: Safety/medical concerns         Wheelchair     Assist Is the patient using a wheelchair?: Yes Type of Wheelchair: Manual    Wheelchair assist level: Supervision/Verbal cueing Max wheelchair distance: 150    Wheelchair 50 feet with 2 turns activity    Assist        Assist Level: Supervision/Verbal cueing   Wheelchair 150 feet activity     Assist      Assist Level: Supervision/Verbal cueing   Blood pressure 136/64, pulse 91, temperature 98 F (36.7 C), temperature source Oral, resp. rate 18, height 6' (1.829 m), weight 85.2 kg, SpO2 95%.  Medical Problem List and Plan: 1. Functional deficits secondary to R brain scattered strokes in watershed distribution with L hemiparesis             -patient may  shower             -ELOS/Goals: 10-14 days- supervision to mod I  -06/24/23 CIR evals today 2.  Antithrombotics: -DVT/anticoagulation:  Pharmaceutical: Lovenox 40mg  QD             -antiplatelet therapy: Plavix and ASA x 3 weeks then ASA alone 3. Pain Management: will add Tramadol 50 mg q6pm per pt request- was home dose- and tylenol prn 4. Mood/Behavior/Sleep: trazodone 100 mg at bedtime- is home dose             -antipsychotic agents: n/a  -Melatonin 3mg  at bedtime PRN 5. Neuropsych/cognition: This patient appears capable of making decisions on his own behalf. 6. Skin/Wound Care: routine skin care/prevention 7. Fluids/Electrolytes/Nutrition: monitor I/O and routine labs  -06/24/23 CMP unremarkable today other than stable Cr 1.38, monitor  8. CAD/HTN- had been on Metoprolol at home, however being held due to permissive  HTN- can restart when needed  -06/24/23 BPs permissively elevated, monitor  8-12: Blood pressure stable, monitor Vitals:   06/23/23 1230 06/23/23 1254 06/23/23 1954 06/24/23 0340  BP: (!) 150/93 (!) 150/93 (!) 164/80 (!) 158/90   06/24/23 1214 06/24/23 1321 06/24/23 1750 06/24/23 1952  BP: (!) 150/85 (!) 154/96 (!) 150/85 (!) 141/76   06/25/23 0332  BP: 136/64    9. CKD3A- baseline Cr 1.2-1.3, admission labs 06/24/23 at baseline,  monitor 10. HLD- con't atorvastatin 40 mg daily 11. DM- insulin dependent- on insulin at home- A1c 8.8- con't SSI and Semglee 42 units at bedtime  -06/24/23 CBGs variable, will monitor for trend  8-12: CBGs generally under 200, monitor CBG (last 3)  Recent Labs    06/24/23 1716 06/24/23 2051 06/25/23 0558  GLUCAP 151* 205* 142*    12. Constipation- normal for pt to go q3-4 days at home  -06/24/23 still no BM x3-4d, will start miralax every day and colace 100mg  BID for now; also has senokot 1 tab QHS PRN; monitor 8-12: Small bowel movement yesterday.  Wife reports last adequate bowel movement last Wednesday.  DC Colace, Senokot S1 tab twice daily, MiraLAX twice daily, KUB today.  May be complicating urinary urgency.  13. Hypothyroidism- con't synthroid daily.  14. Grade 1 diastolic dysfunction- con't to monitor weights. Will add Daily weights if needed Filed Weights   06/23/23 1230  Weight: 85.2 kg    15. Purewick/urgent incontinence- will stop usage today, pt will attempt urinal  8-12: Documented is mostly incontinent, however did have continent urination this morning.  Initiate PVRs, timed voiding.    LOS: 2 days A FACE TO FACE EVALUATION WAS PERFORMED  [name] 06/25/2023, 8:56 AM

## 2023-06-25 NOTE — Progress Notes (Signed)
Physical Therapy Session Note  Patient Details  Name: Hunter Sparks MRN: [id number] Date of Birth: 11-Mar-1946  Today's Date: 06/25/2023 PT Individual Time: 7829-5621 + 3086-5784 PT Individual Time Calculation (min): 72 min +72 min  Short Term Goals: Week 1:  PT Short Term Goal 1 (Week 1): pt will maintain dynamic seated balance with min A PT Short Term Goal 2 (Week 1): Pt will perform bed to chair transfer with LRAD and min A PT Short Term Goal 3 (Week 1): pt will ambulate 50 feet with LRAD and CGA  Skilled Therapeutic Interventions/Progress Updates:     SESSION 1:  Pt presents in room in bed, agreeable to PT, denies pain. Pt demonstrates decreased insight into deficits throughout session and requires frequent education to orient to goals and deficits. Session focused on transfer training and NMR for LLE and postural control in sitting and standing.  Pt completes bed mobility min assist with verbal cues for sequencing LLE, min assist for immediate standing balance with pt demonstrating retropulsion. Pt completes stand pivot transfer max assist to R with pt demonstrating L lateropulsion.  Pt transported to main gym dependently for time management and energy conservation. Pt completes stand pivot transfer max assist to R with L lateropulsion from WC to mat.  Pt completes seated and standing NMR for postural control and core/LLE muscle fiber recruitment needed for functional transfers and ambulation including: - sit<>stands to RW mod assist x5 -Weightshifting in standing with BUE support on RW and without (significant L lateral lean and L lateropulsion 2x30sec with UE support, 4x30 sec without UE support - seated marches 3x10 (1st trial wedge under L hip BUE support, 2nd trial wedge under L hip no UE support, 3rd trial no wedge no UE support) - seated LAQs 3x10 (1st trial wedge under L hip BUE support, 2nd trial wedge under L hip no UE support, 3rd trial no wedge no UE support) - lateral  to R elbow 2x5 - modified sit ups from short sitting reclilned position 2x5 (2nd trial placing squigz on mirror to promote full ROM) - reaching to R to remove and replace squigz on mirror 4x5 (1st two trials with RUE, 2nd two trials reach across midline with LUE)  Pt completes squat pivot transfer to L mod assist, improved ability to transfer to L due to L lateropulsion.  Pt returns to room and remains seated in [facility] with all needs within reach, cal light in place and chair alarm donned and activated at end of session. Pt wife at bedside at end of session.    SESSION 2: Pt presents in room in bed, pt wife at bedside, pt denies pain and agreeable to PT. Session focused on NMR for postural stability and control in standing as well as gait training.  Pt completes bed mobility supervision, improved postural control with immediate sitting balance. Pt completes scooting to L with pt demo L lateral lean, mod cues to correct. Pt completes squat pivot transfer to L with cues for reaching with L to WC armrest.  Pt transported to main gym and positioned in //bars for standing NMR including: - standing balance with R weightshift (use of R //bar for external cue for leaning to touch) completes 1st trial BUE support, 2nd  LUE support only, 3rd no UE support 2 min standing with each trial. - standing marches x16 max verbal cues for leaning to R for LLE march, mod assist for postural stability BUE support on //bars.  Pt transported to day room  via WC and positioned to transfer to nustep. Pt completes stand pivot transfer to L with mod assist. Pt completes continuous training nustep workload 3 x58min with BLEs only, verbal cues for positioning and midline orientation with pt demonstrating L lateral lean initially, verbal cues for L knee control to maintain nuetral hip/knee for activity.  Pt completes stand pivot transfer from nustep to [facility] wit mod assist. Pt ambulates 77' with RW mod assist and 2nd person WC  follow, pt pushing to L, verbal cues for R lateral weightshift with LLE advance phase, able to demonstrate understanding of verbal cues 25% of the time.  Pt returns to room and remains seated in WC, set up at sink to complete shaving with nurse tech. NT notified and pt wife at bedside at end of session.   Therapy Documentation Precautions:  Precautions Precautions: Fall Precaution Comments: L Hemi Restrictions Weight Bearing Restrictions: No   Therapy/Group: Individual Therapy  Edwin Cap PT, DPT 06/25/2023, 2:16 PM

## 2023-06-25 NOTE — Progress Notes (Signed)
Inpatient Rehabilitation Care Coordinator Assessment and Plan Patient Details  Name: Hunter Sparks MRN: [id number] Date of Birth: [birth year]  Today's Date: 06/25/2023  Hospital Problems: Principal Problem:   Cerebral ischemic stroke due to global hypoperfusion with watershed infarct [facility]) Active Problems:   Stroke (cerebrum) [facility])  Past Medical History:  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HCC)    Arthritis    "back, hips, legs" (06/10/2015)   Chest pain    Chronic lower back pain    Depression    DJD (degenerative joint disease)    Hyperlipidemia 06/11/2015   Hypertension    Hypothyroidism    Kidney stone    Kidney stone    Type II diabetes mellitus (HCC)    Past Surgical History:  Past Surgical History:  Procedure Laterality Date   CARDIAC CATHETERIZATION N/A 06/11/2015   Procedure: Left Heart Cath and Coronary Angiography;  Surgeon: Lyn Records, MD;  Location: [facility] INVASIVE CV LAB;  Service: Cardiovascular;  Laterality: N/A;   COLONOSCOPY  06/21/2011   Procedure: COLONOSCOPY;  Surgeon: Malissa Hippo, MD;  Location: [facility] ENDO SUITE;  Service: Endoscopy;  Laterality: N/A;   COLONOSCOPY     COLONOSCOPY N/A 09/20/2017   Procedure: COLONOSCOPY;  Surgeon: Malissa Hippo, MD;  Location: [facility] ENDO SUITE;  Service: Endoscopy;  Laterality: N/A;  1200   CORONARY ARTERY BYPASS GRAFT N/A 06/14/2015   Procedure: CORONARY ARTERY BYPASS GRAFTING times four using Left Internal mammary artery and right leg Saphenous vein graft;  Surgeon: Kerin Perna, MD;  Location: [facility] OR;  Service: Open Heart Surgery;  Laterality: N/A;   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   HAND SURGERY     SKIN CANCER EXCISION Left    "forearm"   TEE WITHOUT CARDIOVERSION N/A 06/14/2015   Procedure: TRANSESOPHAGEAL ECHOCARDIOGRAM (TEE);  Surgeon: Kerin Perna, MD;  Location: [facility] OR;  Service: Open Heart Surgery;  Laterality: N/A;   Social History:  reports that he quit smoking about  19 years ago. His smoking use included cigarettes. He started smoking about 64 years ago. He has a 90 pack-year smoking history. His smokeless tobacco use includes chew. He reports that he does not drink alcohol and does not use drugs.  Family / Support Systems Marital Status: Married How Long?: 56 years Patient Roles: Spouse, Parent Spouse/Significant Other: [name] (wife) Children: 2 children- [place] (lives in [place]), and [place] (lives 6 minutes from their home) Other Supports: None Anticipated Caregiver: Wife Ability/Limitations of Caregiver: Wife is the primary caregiver. she states she is not able to physically lift him due to COPD. Caregiver Availability: 24/7 Family Dynamics: Pt lives with his wife.  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 Education: 9th grade Health Literacy - How often do you need to have someone help you when you read instructions, pamphlets, or other written material from your doctor or pharmacy?: Never Writes: Yes Employment Status: Retired Date Retired/Disabled/Unemployed: [employer] scientist Issues: Denies Guardian/Conservator: Product manager- wife   Abuse/Neglect Abuse/Neglect Assessment Can Be Completed: Unable to assess, patient is non-responsive or altered mental status Physical Abuse: Denies Verbal Abuse: Denies Sexual Abuse: Denies Exploitation of patient/patient's resources: Denies Self-Neglect: Denies  Patient response to: Social Isolation - How often do you feel lonely or isolated from those around you?: Patient unable to respond  Emotional Status Pt's affect, behavior and adjustment status: Pt  presents with flat affect despite sense of humor. He is confused at times. Recent Psychosocial Issues: Denies Psychiatric History: Denies Substance Abuse History: Denies  Patient / Family Perceptions, Expectations  & Goals Pt/Family understanding of illness & functional limitations: Pt wife has a general understanding of pt care needs Premorbid pt/family roles/activities: Independent Anticipated changes in roles/activities/participation: Assistance with ADLs/IADLs Pt/family expectations/goals: Goal is to work towards working on his left side  Building surveyor: None Premorbid Home Care/DME Agencies: None Transportation available at discharge: Wife Is the patient able to respond to transportation needs?: Yes In the past 12 months, has lack of transportation kept you from medical appointments or from getting medications?: No In the past 12 months, has lack of transportation kept you from meetings, work, or from getting things needed for daily living?: No Resource referrals recommended: Neuropsychology  Discharge Planning Living Arrangements: Spouse/significant other Support Systems: Spouse/significant other, Children Type of Residence: Private residence Insurance Resources: Media planner (specify) ([employer]) Financial Resources: Restaurant manager, fast food Screen Referred: No Living Expenses: Own Money Management: Spouse Does the patient have any problems obtaining your medications?: No Home Management: Pt wife manages all home care needs Patient/Family Preliminary Plans: No changes Care Coordinator Barriers to Discharge: Decreased caregiver support, Lack of/limited family support, Insurance for SNF coverage Care Coordinator Anticipated Follow Up Needs: HH/OP Expected length of stay: 2-3 weeks  Clinical Impression SW met with pt and pt wife in room to introduce self, explain role, discuss discharge process, and inform on ELOS. Pt is not a Cytogeneticist. HCOPA- wife [name]. No DME. 3 STE at garage entrance and back entrance with no hand rails at either entrance.    A  06/25/2023, 2:14 PM

## 2023-06-25 NOTE — Progress Notes (Signed)
Met with patient. Wife at bedside. Reports that used to work at [employer]. Discussed team conference every Tuesday. SW will follow up with discussion. Discussed educational binder and to place handouts and resources so that has for discharge. Discussed stroke, stroke medications ASA/Plavix x 3 weeks then asa alone.  Physician will follow up when needs to be changed over to ASA alone.  Monitor B/P at home. Provided chart to keep track of blood pressure when returns home and provided to PCP.  Blood pressure will most likely change once home and PCP can adjust if needed. Discussed diet modifications regarding A1C 8.8 (takes insulin at home) and cholesterol of 226, HDL 33, and LDL 165.  Changes in diet and getting more exercise can decrease both. Not to stay in bed and sit up in wheelchair will help to increase endurence right now. Discussed follow up appointments after discharge. All needs met, call bell in reach.

## 2023-06-25 NOTE — Care Management (Addendum)
Inpatient Rehabilitation Center Individual Statement of Services  Patient Name:  Hunter Sparks  Date:  06/25/2023  Welcome to the Inpatient Rehabilitation Center.  Our goal is to provide you with an individualized program based on your diagnosis and situation, designed to meet your specific needs.  With this comprehensive rehabilitation program, you will be expected to participate in at least 3 hours of rehabilitation therapies Monday-Friday, with modified therapy programming on the weekends.  Your rehabilitation program will include the following services:  Physical Therapy (PT), Occupational Therapy (OT), 24 hour per day rehabilitation nursing, Therapeutic Recreaction (TR), Psychology, Neuropsychology, Care Coordinator, Rehabilitation Medicine, Nutrition Services, Pharmacy Services, and Other  Weekly team conferences will be held on Tuesdays to discuss your progress.  Your Inpatient Rehabilitation Care Coordinator will talk with you frequently to get your input and to update you on team discussions.  Team conferences with you and your family in attendance may also be held.  Expected length of stay: 2-3 weeks    Overall anticipated outcome: Supervision  Depending on your progress and recovery, your program may change. Your Inpatient Rehabilitation Care Coordinator will coordinate services and will keep you informed of any changes. Your Inpatient Rehabilitation Care Coordinator's name and contact numbers are listed  below.  The following services may also be recommended but are not provided by the Inpatient Rehabilitation Center:  Driving Evaluations Home Health Rehabiltiation Services Outpatient Rehabilitation Services Vocational Rehabilitation   Arrangements will be made to provide these services after discharge if needed.  Arrangements include referral to agencies that provide these services.  Your insurance has been verified to be:  [employer]  Your primary doctor is:   Nita Sells  Pertinent information will be shared with your doctor and your insurance company.  Inpatient Rehabilitation Care Coordinator:  Susie Cassette [phone] or ([phone]  Information discussed with and copy given to patient by: Gretchen Short, 06/25/2023, 7:53 AM

## 2023-06-25 NOTE — Progress Notes (Addendum)
Patient ID: Hunter Sparks, male   DOB: 01-Aug-1946, 77 y.o.   MRN: [id number]  SW spoke with pt wife [name] to introduce self, explain role, discuss discharge process, and inform on ELOS. She reports she will be the primary caregiver, however, unable to provide any physical assistance such as pulling/lifting due to COPD. She would like for her husband to be able to ambulate with an AD. She is aware SW will follow-up with updates after team conference.   Cecile Sheerer, MSW, LCSWA Office: [phone] Cell: [phone] Fax: [phone]

## 2023-06-25 NOTE — Discharge Instructions (Addendum)
Inpatient Rehab Discharge Instructions  Hunter Sparks Discharge date and time:    Activities/Precautions/ Functional Status: Activity: no lifting, driving, or strenuous exercise until cleared by MD Diet: diabetic diet Wound Care: none needed Functional status:  ___ No restrictions     ___ Walk up steps independently __x_ 24/7 supervision/assistance   ___ Walk up steps with assistance ___ Intermittent supervision/assistance  ___ Bathe/dress independently ___ Walk with walker     ___ Bathe/dress with assistance ___ Walk Independently    ___ Shower independently ___ Walk with assistance    _x__ Shower with assistance _x__ No alcohol     ___ Return to work/school ________  Special Instructions: No driving, alcohol consumption or tobacco use.  Recommend checking fingerstick blood sugars four (4) times daily and record. Bring this information with you to follow-up appointment with PCP.  Recommend daily BP measurement in same arm and record time of day. Bring this information with you to follow-up appointment with PCP.  STROKE/TIA DISCHARGE INSTRUCTIONS SMOKING Cigarette smoking nearly doubles your risk of having a stroke & is the single most alterable risk factor  If you smoke or have smoked in the last 12 months, you are advised to quit smoking for your health. Most of the excess cardiovascular risk related to smoking disappears within a year of stopping. Ask you doctor about anti-smoking medications [place] Quit Line: 1-800-QUIT NOW Free Smoking Cessation Classes [phone]  CHOLESTEROL Know your levels; limit fat & cholesterol in your diet  Lipid Panel     Component Value Date/Time   CHOL 226 (H) 06/21/2023 0320   TRIG 139 06/21/2023 0320   HDL 33 (L) 06/21/2023 0320   CHOLHDL 6.8 06/21/2023 0320   VLDL 28 06/21/2023 0320   LDLCALC 165 (H) 06/21/2023 0320   LDLCALC 177 (H) 01/03/2018 0739     Many patients benefit from treatment even if their cholesterol is at goal. Goal:  Total Cholesterol (CHOL) less than 160 Goal:  Triglycerides (TRIG) less than 150 Goal:  HDL greater than 40 Goal:  LDL (LDLCALC) less than 100   BLOOD PRESSURE American Stroke Association blood pressure target is less that 120/80 mm/Hg  Your discharge blood pressure is:  BP: 136/64 Monitor your blood pressure Limit your salt and alcohol intake Many individuals will require more than one medication for high blood pressure  DIABETES (A1c is a blood sugar average for last 3 months) Goal HGBA1c is under 7% (HBGA1c is blood sugar average for last 3 months)  Diabetes: Diagnosis of diabetes:  Your A1c:8.8 %    Lab Results  Component Value Date   HGBA1C 8.8 (H) 06/21/2023    Your HGBA1c can be lowered with medications, healthy diet, and exercise. Check your blood sugar as directed by your physician Call your physician if you experience unexplained or low blood sugars.  PHYSICAL ACTIVITY/REHABILITATION Goal is 30 minutes at least 4 days per week  Activity: Increase activity slowly, Therapies: Physical Therapy: Outpatient and Occupational Therapy: Outpatient Return to work: when cleared by MD Activity decreases your risk of heart attack and stroke and makes your heart stronger.  It helps control your weight and blood pressure; helps you relax and can improve your mood. Participate in a regular exercise program. Talk with your doctor about the best form of exercise for you (dancing, walking, swimming, cycling).  DIET/WEIGHT Goal is to maintain a healthy weight  Your discharge diet is:  Diet Order  Diet Heart Room service appropriate? Yes; Fluid consistency: Thin  Diet effective now                  thin liquids Your height is:  Height: 6' (182.9 cm) Your current weight is: Weight: 85.2 kg Your Body Mass Index (BMI) is:  BMI (Calculated): 25.47 Following the type of diet specifically designed for you will help prevent another stroke. Your goal weight range is:   Your goal  Body Mass Index (BMI) is 19-24. Healthy food habits can help reduce 3 risk factors for stroke:  High cholesterol, hypertension, and excess weight.  RESOURCES Stroke/Support Group:  Call [phone]   STROKE EDUCATION PROVIDED/REVIEWED AND GIVEN TO PATIENT Stroke warning signs and symptoms How to activate emergency medical system (call 911). Medications prescribed at discharge. Need for follow-up after discharge. Personal risk factors for stroke. Pneumonia vaccine given: No Flu vaccine given: No My questions have been answered, the writing is legible, and I understand these instructions.  I will adhere to these goals & educational materials that have been provided to me after my discharge from the hospital.      My questions have been answered and I understand these instructions. I will adhere to these goals and the provided educational materials after my discharge from the hospital.  Patient/Caregiver Signature _______________________________ Date __________  Clinician Signature _______________________________________ Date __________  Please bring this form and your medication list with you to all your follow-up doctor's appointments.

## 2023-06-25 NOTE — Progress Notes (Signed)
Inpatient Rehabilitation  Patient information reviewed and entered into eRehab system by Melissa M. Bowie, M.A., CCC/SLP, PPS Coordinator.  Information including medical coding, functional ability and quality indicators will be reviewed and updated through discharge.    

## 2023-06-26 DIAGNOSIS — I639 Cerebral infarction, unspecified: Secondary | ICD-10-CM | POA: Diagnosis not present

## 2023-06-26 LAB — GLUCOSE, CAPILLARY
Glucose-Capillary: 151 mg/dL — ABNORMAL HIGH (ref 70–99)
Glucose-Capillary: 187 mg/dL — ABNORMAL HIGH (ref 70–99)
Glucose-Capillary: 174 mg/dL — ABNORMAL HIGH (ref 70–99)
Glucose-Capillary: 164 mg/dL — ABNORMAL HIGH (ref 70–99)

## 2023-06-26 MED ORDER — SORBITOL 70 % SOLN: 15.0000 mL | Freq: Every day | Status: DC | PRN

## 2023-06-26 MED ORDER — SORBITOL 70 % SOLN
30.0000 mL | Freq: Every day | Status: DC | PRN
  Administered 2023-06-26: 30 mL via ORAL
  Filled 2023-06-26: qty 30

## 2023-06-26 MED ORDER — BISACODYL 10 MG RE SUPP
10.0000 mg | Freq: Every day | RECTAL | Status: DC | PRN
  Administered 2023-06-27: 10 mg via RECTAL
  Filled 2023-06-26: qty 1

## 2023-06-26 NOTE — Progress Notes (Signed)
Occupational Therapy Session Note  Patient Details  Name: Hunter Sparks MRN: [id number] Date of Birth: August 22, 1946  {CHL IP REHAB OT TIME CALCULATIONS:[id number]}   Short Term Goals: Week 1:  OT Short Term Goal 1 (Week 1): The pt will complete LB dressing with MinA  @ 95% safe incorporating AE as needed OT Short Term Goal 2 (Week 1): The patient will complete tub/shower unit transfer with MinA @ 95% safe using the grab bars for addiitonal balance. OT Short Term Goal 3 (Week 1): The pt will complete sit to stand using the RW with MinA at 95% safe. OT Short Term Goal 4 (Week 1): The pt will be demonstrate the use of AE, for LB task performance after initial demonstrate and v/c's with effective carryover at 95% safe. OT Short Term Goal 5 (Week 1): The pt will improve UB strength to 5/5 MMT for safe and Ind functional with BADL related task at 95% safe.  Skilled Therapeutic Interventions/Progress Updates:    Patient agreeable to participate in OT session. Reports *** pain level.   Patient participated in skilled OT session focusing on ***. Therapist facilitated/assessed/developed/educated/integrated/elicited *** in order to improve/facilitate/promote    Therapy Documentation Precautions:  Precautions Precautions: Fall Precaution Comments: L Hemi Restrictions Weight Bearing Restrictions: No  Therapy/Group: Individual Therapy  Limmie Patricia, OTR/L,CBIS  Supplemental OT - [facility] and [facility] Secure Chat Preferred   06/26/2023, 10:05 PM

## 2023-06-26 NOTE — Progress Notes (Signed)
PROGRESS NOTE   Subjective/Complaints:  No acute complaints.  No events overnight.  Patient feeling well today, no complaints. No BM overnight. KUB with moderate stool burden. No incontinence overnight Regarding metallic linear densities seen in right lower quadrant on KUB, patient unsure what this is, denies any prior interventions in that region.  Is passing gas, no abdominal tenderness.  ROS: as per HPI. Denies CP, SOB, abd pain, N/V/D, or any other complaints at this time.   + Constipation Objective:   DG Abd 1 View  Result Date: 06/25/2023 CLINICAL DATA:  Constipation EXAM: ABDOMEN - 1 VIEW COMPARISON:  None Available. FINDINGS: Bowel gas pattern is nonspecific. Moderate amount of stool is seen in colon and rectum. There is no fecal impaction in rectum. No abnormal masses are seen. Calcifications in left side of pelvis may be vascular. Kidneys are partly obscured by bowel contents limiting evaluation for small renal stones. In 1 of the views, there are 2 small linear radiopacities in right lower quadrant of abdomen. This finding was not distinctly seen in CT abdomen and pelvis done on 12/03/2021. Degenerative changes are noted in lumbar spine, more severe at L1-L2 level. IMPRESSION: Nonspecific bowel gas pattern. Moderate amount of stool is seen in colon. No abnormal masses or calcifications are noted. There are 2 small thin linear metallic densities in right lower quadrant of abdomen which may suggest artifacts or foreign bodies. If clinically warranted, follow-up CT may be considered. Electronically Signed   By: Ernie Avena M.D.   On: 06/25/2023 18:34   Recent Labs    06/24/23 0728  WBC 6.7  HGB 15.5  HCT 46.1  PLT 193   Recent Labs    06/24/23 0728  NA 140  K 3.5  CL 103  CO2 26  GLUCOSE 182*  BUN 12  CREATININE 1.38*  CALCIUM 9.0    Intake/Output Summary (Last 24 hours) at 06/26/2023 0853 Last data filed at  06/26/2023 0839 Gross per 24 hour  Intake 480 ml  Output 50 ml  Net 430 ml        Physical Exam: Vital Signs Blood pressure (!) 154/62, pulse 94, temperature 98.2 F (36.8 C), temperature source Oral, resp. rate 19, height 6' (1.829 m), weight 85.2 kg, SpO2 98%.   PE: Constitution: Appropriate appearance for age. No apparent distress.  Sitting up in bed Resp: No respiratory distress. No accessory muscle usage. on RA and CTAB Cardio: Well perfused appearance. No peripheral edema.  Regular rate and rhythm. Abdomen: Nondistended. Nontender.   Psych: Appropriate mood and affect. Neuro: AAOx4.  Mild cognitive delay, otherwise no apparent deficits.  Neurologic Exam:   DTRs: Reflexes were 2+ in bilateral achilles, patella, biceps, BR and triceps. Sensory exam: revealed normal sensation in all dermatomal regions in bilateral upper extremities and bilateral lower extremities Motor exam:  RUE 5/5 LUE- 5/5 RLE- 5/5  LLE- 4+/5 in HF/KE/KF and 5-/5 in DF/PF   MSK: Bilateral hand Dupuytren's contractures   Assessment/Plan: 1. Functional deficits which require 3+ hours per day of interdisciplinary therapy in a comprehensive inpatient rehab setting. Physiatrist is providing close team supervision and 24 hour management of active medical problems listed below. Physiatrist  and rehab team continue to assess barriers to discharge/monitor patient progress toward functional and medical goals  Care Tool:  Bathing    Body parts bathed by patient: Right arm, Left arm, Chest, Abdomen, Front perineal area, Buttocks, Right upper leg, Face, Left upper leg, Right lower leg, Left lower leg         Bathing assist Assist Level: Contact Guard/Touching assist (CGA to MinA)     Upper Body Dressing/Undressing Upper body dressing   What is the patient wearing?: Pull over shirt    Upper body assist Assist Level: Set up assist    Lower Body Dressing/Undressing Lower body dressing      What is  the patient wearing?: Pants     Lower body assist Assist for lower body dressing: Contact Guard/Touching assist (CGA to MinA secondary to challenges with functional balance)     Toileting Toileting    Toileting assist Assist for toileting: Supervision/Verbal cueing (S without cues)     Transfers Chair/bed transfer  Transfers assist  Chair/bed transfer activity did not occur: Safety/medical concerns  Chair/bed transfer assist level: Maximal Assistance - Patient 25 - 49%     Locomotion Ambulation   Ambulation assist      Assist level: Moderate Assistance - Patient 50 - 74% Assistive device: Walker-rolling Max distance: 15   Walk 10 feet activity   Assist     Assist level: Moderate Assistance - Patient - 50 - 74% Assistive device: Walker-rolling   Walk 50 feet activity   Assist Walk 50 feet with 2 turns activity did not occur: Safety/medical concerns         Walk 150 feet activity   Assist Walk 150 feet activity did not occur: Safety/medical concerns         Walk 10 feet on uneven surface  activity   Assist Walk 10 feet on uneven surfaces activity did not occur: Safety/medical concerns         Wheelchair     Assist Is the patient using a wheelchair?: Yes Type of Wheelchair: Manual    Wheelchair assist level: Supervision/Verbal cueing Max wheelchair distance: 150    Wheelchair 50 feet with 2 turns activity    Assist        Assist Level: Supervision/Verbal cueing   Wheelchair 150 feet activity     Assist      Assist Level: Supervision/Verbal cueing   Blood pressure (!) 154/62, pulse 94, temperature 98.2 F (36.8 C), temperature source Oral, resp. rate 19, height 6' (1.829 m), weight 85.2 kg, SpO2 98%.  Medical Problem List and Plan: 1. 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   2.  Antithrombotics: -DVT/anticoagulation:  Pharmaceutical: Lovenox 40mg  QD             -antiplatelet therapy: Plavix and ASA x 3 weeks then ASA alone 3. Pain Management: will add Tramadol 50 mg q6pm per pt request- was home dose- and tylenol prn 4. Mood/Behavior/Sleep: trazodone 100 mg at bedtime- is home dose             -  antipsychotic agents: n/a  -Melatonin 3mg  at bedtime PRN 5. Neuropsych/cognition: This patient appears capable of making decisions on his own behalf. 6. Skin/Wound Care: routine skin care/prevention 7. Fluids/Electrolytes/Nutrition: monitor I/O and routine labs  -06/24/23 CMP unremarkable today other than stable Cr 1.38, monitor  8. CAD/HTN- had been on Metoprolol at home, however being held due to permissive HTN- can restart when needed  -06/24/23 BPs permissively elevated, monitor  8-12=13: Blood pressure stable, monitor Vitals:   06/23/23 1230 06/23/23 1254 06/23/23 1954 06/24/23 0340  BP: (!) 150/93 (!) 150/93 (!) 164/80 (!) 158/90   06/24/23 1214 06/24/23 1321 06/24/23 1750 06/24/23 1952  BP: (!) 150/85 (!) 154/96 (!) 150/85 (!) 141/76   06/25/23 0332 06/25/23 1311 06/25/23 2015 06/26/23 0600  BP: 136/64 (!) 161/87 (!) 147/67 (!) 154/62    9. CKD3A- baseline Cr 1.2-1.3, admission labs 06/24/23 at baseline, monitor 10. HLD- con't atorvastatin 40 mg daily 11. DM- insulin dependent- on insulin at home- A1c 8.8- con't SSI and Semglee 42 units at bedtime  -06/24/23 CBGs variable, will monitor for trend  8-12: CBGs generally under 200, monitor CBG (last 3)  Recent Labs    06/25/23 1639 06/25/23 2049 06/26/23 0615  GLUCAP 144* 206* 151*    12. Constipation- normal for pt to go q3-4 days at home  -06/24/23 still no BM x3-4d, will start miralax every day and  colace 100mg  BID for now; also has senokot 1 tab QHS PRN; monitor 8-12: Small bowel movement yesterday.  Wife reports last adequate bowel movement last Wednesday.  DC Colace, Senokot S1 tab twice daily, MiraLAX twice daily, KUB today.  May be complicating urinary urgency. 8/13: KUB moderate stool burden. PRN sorbitol today, suppository if no results.  Questionable ideology of linear densities seen on KUB.   13. Hypothyroidism- con't synthroid daily.  14. Grade 1 diastolic dysfunction- con't to monitor weights. Will add Daily weights if needed Filed Weights   06/23/23 1230  Weight: 85.2 kg    15. Purewick/urgent incontinence- will stop usage today, pt will attempt urinal  8-12: Documented is mostly incontinent, however did have continent urination this morning.  Initiate PVRs, timed voiding.   - continent, monitor; given significant stool burden will likely improve once BM    LOS: 3 days A FACE TO FACE EVALUATION WAS PERFORMED  [name] 06/26/2023, 8:53 AM

## 2023-06-26 NOTE — IPOC Note (Signed)
Overall Plan of Care [facility]) Patient Details Name: Hunter Sparks MRN: [id number] DOB: [birth year]  Admitting Diagnosis: Cerebral ischemic stroke due to global hypoperfusion with watershed infarct [facility])  Hospital Problems: Principal Problem:   Cerebral ischemic stroke due to global hypoperfusion with watershed infarct [facility]) Active Problems:   Stroke (cerebrum) (HCC)     Functional Problem List: Nursing Bladder, Bowel, Endurance, Perception, Safety, Skin Integrity  PT Balance, Behavior, Edema, Endurance, Motor, Safety  OT Balance  SLP    TR         Basic ADL's: OT Dressing     Advanced  ADL's: OT       Transfers: PT Bed Mobility, Bed to Chair, Car  OT       Locomotion: PT Ambulation, Wheelchair Mobility, Stairs     Additional Impairments: OT Other (comment) (some challenges with bilateral hand manipulaton secondary to  contractures)  SLP        TR      Anticipated Outcomes Item Anticipated Outcome  Self Feeding ModI  Swallowing      Basic self-care  ModI  Toileting  ModI   Bathroom Transfers ModI  Bowel/Bladder  Pt will be continient  Transfers  supervision  Locomotion  supervision  Communication     Cognition     Pain  Pain will be under control.  Safety/Judgment      Therapy Plan: PT Intensity: Minimum of 1-2 x/day ,45 to 90 minutes PT Frequency: 5 out of 7 days PT Duration Estimated Length of Stay: 2-3 weeks OT Frequency: 5 out of 7 days, Total of 15 hours over 7 days of combined therapies Ot intensity ;Minimum of 1-2x/day, 45 to 90 min OT duration estimated LOS 2-3 weeks     Team Interventions: Nursing Interventions Patient/Family Education, Bladder Management, Bowel Management  PT interventions Ambulation/gait training, Discharge planning, Functional mobility training, Psychosocial support, Therapeutic Activities, Visual/perceptual remediation/compensation, Balance/vestibular training, Disease management/prevention, Neuromuscular  re-education, Skin care/wound management, Therapeutic Exercise, Wheelchair propulsion/positioning, Cognitive remediation/compensation, DME/adaptive equipment instruction, Pain management, Splinting/orthotics, UE/LE Strength taining/ROM, Community reintegration, Development worker, international aid stimulation, Patient/family education, Museum/gallery curator, UE/LE Coordination activities  OT Interventions Warden/ranger, Therapeutic Exercise, UE/LE Coordination activities, Self Care/advanced ADL retraining, UE/LE Strength taining/ROM, Therapeutic Activities, Functional mobility training  SLP Interventions    TR Interventions    SW/CM Interventions Discharge Planning, Psychosocial Support, Patient/Family Education   Barriers to Discharge MD  Medical stability, Home enviroment access/loayout, Incontinence, Lack of/limited family support, and Insurance for SNF coverage  Nursing Incontinence Increased weakness due to stroke  PT Inaccessible home environment, Home environment access/layout, Decreased caregiver support, Behavior, Lack of/limited family support    OT      SLP      SW Decreased caregiver support, Lack of/limited family support, Community education officer for SNF coverage     Team Discharge Planning: Destination: PT-Home ,OT- Home , SLP-  Projected Follow-up: PT-Home health PT, OT-  Home health OT, SLP-  Projected Equipment Needs: PT-To be determined, OT- Rolling walker with 5" wheels, 3 in 1 bedside comode, SLP-  Equipment Details: PT- , OT-to be determined Patient/family involved in discharge planning: PT- Patient,  OT-Patient, SLP-   MD ELOS: 14 days Medical Rehab Prognosis:  Good Assessment: The patient has been admitted for CIR therapies with the diagnosis of R cerebral CVA. The team will be addressing functional mobility, strength, stamina, balance, safety, adaptive techniques and equipment, self-care, bowel and bladder mgt, patient and caregiver education. Goals have been set at Supervision.  Anticipated discharge destination  is home.       See Team Conference Notes for weekly updates to the plan of care

## 2023-06-26 NOTE — Plan of Care (Signed)
  Problem: RH Balance Goal: LTG: Patient will maintain dynamic sitting balance (OT) Description: LTG:  Patient will maintain dynamic sitting balance with assistance during activities of daily living (OT) Flowsheets (Taken 06/26/2023 1044) LTG: Pt will maintain dynamic sitting balance during ADLs with: Supervision/Verbal cueing Note: Goals downgraded 8/13-ESD Goal: LTG Patient will maintain dynamic standing with ADLs (OT) Description: LTG:  Patient will maintain dynamic standing balance with assist during activities of daily living (OT)  Flowsheets (Taken 06/26/2023 1045) LTG: Pt will maintain dynamic standing balance during ADLs with: Supervision/Verbal cueing Note: Goals downgraded 8/13-ESD   Problem: Sit to Stand Goal: LTG:  Patient will perform sit to stand in prep for activites of daily living with assistance level (OT) Description: LTG:  Patient will perform sit to stand in prep for activites of daily living with assistance level (OT) Flowsheets (Taken 06/26/2023 1045) LTG: PT will perform sit to stand in prep for activites of daily living with assistance level: Supervision/Verbal cueing Note: Goals downgraded 8/13-ESD   Problem: RH Bathing Goal: LTG Patient will bathe all body parts with assist levels (OT) Description: LTG: Patient will bathe all body parts with assist levels (OT) Flowsheets (Taken 06/26/2023 1045) LTG: Pt will perform bathing with assistance level/cueing: Supervision/Verbal cueing Note: Goals downgraded 8/13-ESD   Problem: RH Dressing Goal: LTG Patient will perform upper body dressing (OT) Description: LTG Patient will perform upper body dressing with assist, with/without cues (OT). Flowsheets (Taken 06/26/2023 1045) LTG: Pt will perform upper body dressing with assistance level of: Supervision/Verbal cueing Note: Goals downgraded 8/13-ESD Goal: LTG Patient will perform lower body dressing w/assist (OT) Description: LTG: Patient will perform lower body dressing  with assist, with/without cues in positioning using equipment (OT) Flowsheets (Taken 06/26/2023 1045) LTG: Pt will perform lower body dressing with assistance level of: Supervision/Verbal cueing Note: Goals downgraded 8/13-ESD   Problem: RH Toileting Goal: LTG Patient will perform toileting task (3/3 steps) with assistance level (OT) Description: LTG: Patient will perform toileting task (3/3 steps) with assistance level (OT)  Flowsheets (Taken 06/26/2023 1045) LTG: Pt will perform toileting task (3/3 steps) with assistance level: Contact Guard/Touching assist Note: Goals downgraded 8/13-ESD   Problem: RH Toilet Transfers Goal: LTG Patient will perform toilet transfers w/assist (OT) Description: LTG: Patient will perform toilet transfers with assist, with/without cues using equipment (OT) Flowsheets (Taken 06/26/2023 1045) LTG: Pt will perform toilet transfers with assistance level of: Supervision/Verbal cueing Note: Goals downgraded 8/13-ESD   Problem: RH Tub/Shower Transfers Goal: LTG Patient will perform tub/shower transfers w/assist (OT) Description: LTG: Patient will perform tub/shower transfers with assist, with/without cues using equipment (OT) Flowsheets (Taken 06/26/2023 1045) LTG: Pt will perform tub/shower stall transfers with assistance level of: Supervision/Verbal cueing Note: Goals downgraded 8/13-ESD

## 2023-06-26 NOTE — Progress Notes (Addendum)
Patient ID: Hunter Sparks, male   DOB: 04-05-1946, 77 y.o.   MRN: [id number]  SW met with pt in room to provide updates from team conference, but wife not present. SW informed pt on discharge date. He is eager to leave sooner.   1252- SW spoke with pt wife [name] to provide updates from team conference, and d/c date 8/30. She reiterates she is hopeful he will not require any physical assistance as she is unable to provide. SW shared goal is supervision level of care at discharge. Fam edu on Monday (8/26) 1pm-4pm.   Cecile Sheerer, MSW, LCSWA Office: [phone] Cell: [phone] Fax: [phone]

## 2023-06-26 NOTE — Patient Care Conference (Signed)
Inpatient RehabilitationTeam Conference and Plan of Care Update Date: 06/26/2023   Time: 10:42 AM    Patient Name: Hunter Sparks      Medical Record Number: [id number]  Date of Birth: 16-Dec-1945 Sex: Male         Room/Bed: 4M02C/4M02C-01 Payor Info: Payor: [employer] / Plan: [employer] HMO / Product Type: *No Product type* /    Admit Date/Time:  06/23/2023 12:22 PM  Primary Diagnosis:  Cerebral ischemic stroke due to global hypoperfusion with watershed infarct [facility])  Hospital Problems: Principal Problem:   Cerebral ischemic stroke due to global hypoperfusion with watershed infarct [facility]) Active Problems:   Stroke (cerebrum) [facility])    Expected Discharge Date: Expected Discharge Date: 07/13/23  Team Members Present: Physician leading conference: Dr. Elijah Birk Social Worker Present: Cecile Sheerer, LCSWA Nurse Present: Vedia Pereyra, RN PT Present: Darrold Span, PT OT Present: Kearney Hard, OT PPS Coordinator present : Fae Pippin, SLP     Current Status/Progress Goal Weekly Team Focus  Bowel/Bladder   incontinent to bladder with some continent episodes, continent to bowel LBM 8/12  *** pt to be completly contintent   toilet pt every 4 hours, assess every shift    Swallow/Nutrition/ Hydration      ***         ADL's   Max A sit<>stand, transfers, UB set-up, Max-Total A LB. Baseline: Bilateral hands contractures  *** Mod I   core strength to increase sitting balance and proprioception. LB dressing with AE. UB strength to increase sit to stand transitions    Mobility   minA bed mobility, maxA transfers, x2 ambulation with RW mod/maxA 15'  *** supervision ambulatory - may downgrade based on progress  barriers: decreased insight into deficits, decreased caregiver assist at DC. Focus on core strengthening and postural control, dynamic sitting/standing balance, gait training, transfer training    Communication      ***           Safety/Cognition/ Behavioral Observations     ***          Pain   pt denies pain  *** to remain pain free   assess evey shift and prn    Skin   Redness and MASD to buttock  *** skin to not show futher signs of breakdown  decrease incontinent episodes, time toileting assess every shift and prm      Discharge Planning:  D/c to home with wife who will be primary caregiver. Wife is unable to provide any physical assistance due to COPD. She would like him to be able to ambulate with an AD. SW will confirm there are no barriers to discharge.   Team Discussion: Cerebral ischemic stroke due to global hypoperfusion with watershed infarct. Addressing constipation. Time toileting. Working on diet and other modifications to improve A1c of 8.8. Takes insulin at home. Tolerating heart healthy diet. Therapies working on [employer], gait training, and lower body dressing with AE.  Patient on target to meet rehab goals: Some goals downgraded with discharge date of 07/13/23  *See Care Plan and progress notes for long and short-term goals.   Revisions to Treatment Plan:  ABD xray.  Adjusting medications. Monitor labs and VS Teaching Needs: Medications, safety, self care, skin care, diet education, gait/transfer training, etc.   Current Barriers to Discharge: Decreased caregiver support, Home enviroment access/layout, and Incontinence  Possible Resolutions to Barriers: Family education Regain continence of bowel/bladder Order recommended DME     Medical Summary  I attest that I was present, lead the team conference, and concur with the assessment and plan of the team.   Jearld Adjutant 06/26/2023, 3:25 PM

## 2023-06-26 NOTE — Progress Notes (Signed)
Occupational Therapy Session Note  Patient Details  Name: Hunter Sparks MRN: [id number] Date of Birth: [birth year]  Today's Date: 06/26/2023 OT Individual Time: 7829-5621 OT Individual Time Calculation (min): 75 min    Short Term Goals: Week 1:  OT Short Term Goal 1 (Week 1): The pt will complete LB dressing with MinA  @ 95% safe incorporating AE as needed OT Short Term Goal 2 (Week 1): The patient will complete tub/shower unit transfer with MinA @ 95% safe using the grab bars for addiitonal balance. OT Short Term Goal 3 (Week 1): The pt will complete sit to stand using the RW with MinA at 95% safe. OT Short Term Goal 4 (Week 1): The pt will be demonstrate the use of AE, for LB task performance after initial demonstrate and v/c's with effective carryover at 95% safe. OT Short Term Goal 5 (Week 1): The pt will improve UB strength to 5/5 MMT for safe and Ind functional with BADL related task at 95% safe.  Skilled Therapeutic Interventions/Progress Updates:    Pt greeted supine in bed with nurse tech assisting with brief change after pt spilled urinal in bed. Handoff to OT for LB dressing. Pt completed bed mobility with mod A. We worked on sitting balance at EOB while working on Holiday representative legs. OT educated on LB hemi dressing strategies with pt needing mod A to thread pant legs. Sit<>stand using Stedy and mod A to get to full hip and trunk extension, then transferred to wc in [place]. Pt needed OT assist to get shoes on, but then worked on B hand coordination to tie shoes with pt able to tie B shoes with increased time and effort. Pt brought to therapy gym in wc. UB there-ex using [employer] arm bike 5 minutes forward and 5 minutes backwards. Using 4 lb weighted bar, pt completed 3 sets of 10 bicep curl, straight arm raise, and chest press. Worked on standing balance standing at counter top, then reaching for objects outside base of support. Pt needed max A to stand and mod/max A to maintain balance.    Therapy Documentation Precautions:  Precautions Precautions: Fall Precaution Comments: L Hemi Restrictions Weight Bearing Restrictions: No Pain: Pain Assessment Pain Scale: 0-10 Pain Score: 0-No pain    Therapy/Group: Individual Therapy  [name] 06/26/2023, 9:47 AM

## 2023-06-26 NOTE — Progress Notes (Signed)
Physical Therapy Session Note  Patient Details  Name: Hunter Sparks MRN: [id number] Date of Birth: 1946/07/16  Today's Date: 06/26/2023 PT Individual Time: 1100-1159 + 1348-1459 PT Individual Time Calculation (min): 59 min + 71 min  Short Term Goals: Week 1:  PT Short Term Goal 1 (Week 1): pt will maintain dynamic seated balance with min A PT Short Term Goal 2 (Week 1): Pt will perform bed to chair transfer with LRAD and min A PT Short Term Goal 3 (Week 1): pt will ambulate 50 feet with LRAD and CGA  Skilled Therapeutic Interventions/Progress Updates:    SESSION 1: Pt presents in room in [facility], reports fatigued but agreeable to PT with encouragement. Pt denies pain at this time. Session focused on NMR for dynamic standing balance and weightshifting to R to decrease L lateropulsion in sitting, standing, transfers, and gait.  Pt transported to main gym dependently in [facility] for time management. Pt completes sit<>stand with min/mod assist to RW x5, completes weightshifting to R with LLE march x10 with each stand.  Pt attempts to complete stand step transfer to mat however pt demonstrates bilateral flexed knees unable to maintain upright posture for turn, requires max assist x2 for maintaining balance and advancing BLEs/RW for turn to sit on mat.  Pt completes x5 sit<>stand from elevated mat, LUE support only on therapist shoulder, therapist positioned on L side to block L knee for stand requires max verbal cues and visual cues with mirror for weightshifting to R and repositioning upright, pt cont to demonstrate forward flexed posture.Min assist for postural stability  Pt completes x3 sit<>stand BUE support on RW with compliant surface beneath R foot  to promote improved LLE strengthening and muscle fiber recruitment for stand and decrease pushing behavior with RLE. Pt continues to require mod assist for standing balance and max verbal/visual cues for upright postural and R lateral lean. Pt attempt  sit<>stand with 2" step beneath R foot however pt demonstrates significant increase in L lateropulsion and returns to sitting.  Pt completes side lean to R elbow reaching across with LUE to hit therapist hand to facilitate R lateral lean. Pt with signficant improvement in sitting balance initially in unsupported sittign throughout session however does begin to demonstrate increased posterior and L lateral lean with fatigue requiring increased verbal cues to correct with prolonged unsupported sitting.  Pt stands with BUE support on RW and takes unilaterally UE off RW to reach therapist hand positioned on R side, max verbal cues for R lateral lean mod assist for postural stability. X5 completed with each UE.  Pt returns to room and remains seated in [facility] with all needs within reach, cal light in place and chair alarm donned and activated at end of session. RN in room at end of session.   SESSION 2: Pt presents in room in bed, reporting slight pain in back but declines intervention at this time and agreeable to PT. Session focused on NMR for improved muscle fiber recuitment needed for functional transfers and ambulation as well as gait training.  Pt completes bed mobility with increased time and supervision, cues for sequencing and positioning once EOB. Pt completes squat pivot transfer with mod assist. Pt transported to dayroom dependently and positioned to transfer to nustep. Pt completes squat pivot mod assist to L to nustep.  Pt completes NMR on nustep 3 min continuous training followed by x10 min interval training 30 sec work/30sec rest at workload 3 BLEs only to promote improved BLE muscle fiber  recruitment and midline orientation. Pt initially demonstrating self selected speed 20 SPM, provided with verbal cues with interval training to increase speed with pt demonstrating intervals 40-45 SPM.  Pt completes NMR in standing to promote R lateral lean with LLE march prior to gait training to promote  improved postural stability and sequencing.  Pt then attempt ambulation with RW mod/max assist 20' with 2nd person WC follow, verbal cues for R lateral lean with L advance phase however pt with increased difficulty this session completing R lateral lean and demonstrates bilateral knee flexion in stance phase despite verbal cues for upright posture.  Pt transported to hallway outside main gym to complete gait training and handrail. Pt ambulates 2x35' with RUE support on RHR max verbal cues for R lateral lean into wall, mirror positioned in front of pt for visual cues. Pt requires min/mod assist for postural stability, no L knee buckling but does demonstrate flexed posturing bilaterally. First trial pt demonstrates excessively wide BOS, second trial numbered dots placed on ground to promote improved BOS and consistency with step length.  Pt returns to room and transfers to L squat pivot mod assist. Pt completes sit to supine supervision with verbal cues for positioning with all needs within reach, cal light in place and chair alarm donned and activated at end of session.   Therapy Documentation Precautions:  Precautions Precautions: Fall Precaution Comments: L Hemi Restrictions Weight Bearing Restrictions: No   Therapy/Group: Individual Therapy  Edwin Cap PT, DPT 06/26/2023, 12:04 PM

## 2023-06-27 DIAGNOSIS — I639 Cerebral infarction, unspecified: Secondary | ICD-10-CM | POA: Diagnosis not present

## 2023-06-27 LAB — GLUCOSE, CAPILLARY
Glucose-Capillary: 119 mg/dL — ABNORMAL HIGH (ref 70–99)
Glucose-Capillary: 163 mg/dL — ABNORMAL HIGH (ref 70–99)
Glucose-Capillary: 243 mg/dL — ABNORMAL HIGH (ref 70–99)
Glucose-Capillary: 185 mg/dL — ABNORMAL HIGH (ref 70–99)

## 2023-06-27 MED ORDER — SENNOSIDES-DOCUSATE SODIUM 8.6-50 MG PO TABS
2.0000 | ORAL_TABLET | Freq: Two times a day (BID) | ORAL | Status: DC
  Administered 2023-06-27 – 2023-07-01 (×8): 2 via ORAL
  Filled 2023-06-27 (×7): qty 2

## 2023-06-27 MED ORDER — BISACODYL 10 MG RE SUPP: 10.0000 mg | Freq: Once | RECTAL | Status: DC

## 2023-06-27 NOTE — Progress Notes (Signed)
Physical Therapy Session Note  Patient Details  Name: Hunter Sparks MRN: [id number] Date of Birth: [birth year]  Today's Date: 06/27/2023 PT Individual Time: 4098-1191 + 1132-1202 PT Individual Time Calculation (min): 42 min + 30 min  Short Term Goals: Week 1:  PT Short Term Goal 1 (Week 1): pt will maintain dynamic seated balance with min A PT Short Term Goal 2 (Week 1): Pt will perform bed to chair transfer with LRAD and min A PT Short Term Goal 3 (Week 1): pt will ambulate 50 feet with LRAD and CGA  Skilled Therapeutic Interventions/Progress Updates:     SESSION 1:  Pt presents in room in bed, agreeable to PT, denies pain. Pt reports wanting to get dressed and having incontinent brief. Session focused on participation with self care tasks, tolerance to upright, and NMR for orienting to midline and decreasing L lateropulsion.  Therapist dons bilateral socks total assist with pt in supine. Therapist threads pants with pt in supine total assist prior to bed mobility. Pt completes supine to sit with increased time supervision and cues for positioning and sequencing as well as scooting EOB. Pt completes sit<>stand to sara stedy pulling up right with BUEs on stedy, CGA with  cues for R lateral lean. Therapist changes brief with pt in standing total assist, cues for pt to participate with self care with pt using unilateral UE support on stedy while washing periarea. Therapist dons brief total assist and max assist for pulling pants over hips while pt maintains standing with BUE support on stedy. Therapist provides mod/max verbal cues with fatigue for midline orientation. Pt transferred to [facility] via stedy.  Pt transitioned into hallway via WC dependently and positioned with handrail on R side for NMR. Pt completes NMR with verbal cues for R lateral lean to position R shoulder into wall while completing LLE marches and hip abduction x10 each. With fatigue pt begins to demonstrate crouched posturing and  requires mod/max assist to return to sitting in [facility] for rest break between exercises.  Pt returns to room and remains seated in [facility] with all needs within reach, cal light in place and chair alarm donned and activated at end of session.    SESSION 2: Pt presents in room in [facility], wife [name] present at beginning of session. Pt denies pain. Session focused on gait training to promote improved weightshifting and midline orientation for functional ambulation. Pt completes sit<>stands min assist with RUE support on RHR during session.  Pt transported to hallway outside of main gym and positioned with handrail on R. Pt completes stand and ambulates 3x35' with RHR on wall, mirror positioned in front min/mod fading to max assist with final trial. Max verbal cues for R lateral lean into wall and upright posture with cues to look forward into mirror, with fatigue pt with decreasing motor planning and increased crouched gait and posturing requiring mod to max assist for postural stability. Pt requires seated rest break between trials for energy conservation and quality with task.  Pt returns to room and remains seated in [facility] with all needs within reach, cal light in place and chair alarm donned and activated at end of session.   Therapy Documentation Precautions:  Precautions Precautions: Fall Precaution Comments: L Hemi Restrictions Weight Bearing Restrictions: No    Therapy/Group: Individual Therapy  Edwin Cap PT, DPT 06/27/2023, 8:47 AM

## 2023-06-27 NOTE — Progress Notes (Signed)
PROGRESS NOTE   Subjective/Complaints:  No acute complaints.  No events overnight.   BP stable, intermittent urinary incontinence Sorbitol given yesterday without results; patient endorses large BM this AM  ROS: as per HPI. Denies CP, SOB, abd pain, N/V/D, or any other complaints at this time.     Objective:   DG Abd 1 View  Result Date: 06/25/2023 CLINICAL DATA:  Constipation EXAM: ABDOMEN - 1 VIEW COMPARISON:  None Available. FINDINGS: Bowel gas pattern is nonspecific. Moderate amount of stool is seen in colon and rectum. There is no fecal impaction in rectum. No abnormal masses are seen. Calcifications in left side of pelvis may be vascular. Kidneys are partly obscured by bowel contents limiting evaluation for small renal stones. In 1 of the views, there are 2 small linear radiopacities in right lower quadrant of abdomen. This finding was not distinctly seen in CT abdomen and pelvis done on 12/03/2021. Degenerative changes are noted in lumbar spine, more severe at L1-L2 level. IMPRESSION: Nonspecific bowel gas pattern. Moderate amount of stool is seen in colon. No abnormal masses or calcifications are noted. There are 2 small thin linear metallic densities in right lower quadrant of abdomen which may suggest artifacts or foreign bodies. If clinically warranted, follow-up CT may be considered. Electronically Signed   By: Ernie Avena M.D.   On: 06/25/2023 18:34   No results for input(s): "WBC", "HGB", "HCT", "PLT" in the last 72 hours.  No results for input(s): "NA", "K", "CL", "CO2", "GLUCOSE", "BUN", "CREATININE", "CALCIUM" in the last 72 hours.   Intake/Output Summary (Last 24 hours) at 06/27/2023 0813 Last data filed at 06/26/2023 0929 Gross per 24 hour  Intake 240 ml  Output 200 ml  Net 40 ml        Physical Exam: Vital Signs Blood pressure (!) 152/70, pulse 93, temperature 98.7 F (37.1 C), resp. rate 19, height  6' (1.829 m), weight 85.2 kg, SpO2 95%.   PE: Constitution: Appropriate appearance for age. No apparent distress.  Working with therapies.  Resp: No respiratory distress. No accessory muscle usage. on RA and CTAB Cardio: Well perfused appearance. No peripheral edema.  Regular rate and rhythm. Abdomen: Nondistended. Nontender.   Psych: Appropriate mood and affect. Neuro: AAOx4.  Mild cognitive delay, otherwise no apparent deficit - stable  Neurologic Exam:   Sensory exam: revealed normal sensation in all dermatomal regions in bilateral upper extremities and bilateral lower extremities Motor exam:  RUE 5/5 LUE- 5/5 RLE- 5/5  LLE- 4+/5 in HF/KE/KF and 5-/5 in DF/PF +balance defitcit  MSK: Bilateral hand Dupuytren's contractures   Assessment/Plan: 1. Functional deficits which require 3+ hours per day of interdisciplinary therapy in a comprehensive inpatient rehab setting. Physiatrist is providing close team supervision and 24 hour management of active medical problems listed below. Physiatrist and rehab team continue to assess barriers to discharge/monitor patient progress toward functional and medical goals  Care Tool:  Bathing    Body parts bathed by patient: Right arm, Left arm, Chest, Abdomen, Front perineal area, Buttocks, Right upper leg, Face, Left upper leg, Right lower leg, Left lower leg         Bathing assist Assist Level:  Contact Guard/Touching assist (CGA to MinA)     Upper Body Dressing/Undressing Upper body dressing   What is the patient wearing?: Pull over shirt    Upper body assist Assist Level: Set up assist    Lower Body Dressing/Undressing Lower body dressing      What is the patient wearing?: Pants     Lower body assist Assist for lower body dressing: Contact Guard/Touching assist (CGA to MinA secondary to challenges with functional balance)     Toileting Toileting    Toileting assist Assist for toileting: Supervision/Verbal cueing (S  without cues)     Transfers Chair/bed transfer  Transfers assist  Chair/bed transfer activity did not occur: Safety/medical concerns  Chair/bed transfer assist level: Maximal Assistance - Patient 25 - 49%     Locomotion Ambulation   Ambulation assist      Assist level: Moderate Assistance - Patient 50 - 74% Assistive device: Walker-rolling Max distance: 15   Walk 10 feet activity   Assist     Assist level: Moderate Assistance - Patient - 50 - 74% Assistive device: Walker-rolling   Walk 50 feet activity   Assist Walk 50 feet with 2 turns activity did not occur: Safety/medical concerns         Walk 150 feet activity   Assist Walk 150 feet activity did not occur: Safety/medical concerns         Walk 10 feet on uneven surface  activity   Assist Walk 10 feet on uneven surfaces activity did not occur: Safety/medical concerns         Wheelchair     Assist Is the patient using a wheelchair?: Yes Type of Wheelchair: Manual    Wheelchair assist level: Supervision/Verbal cueing Max wheelchair distance: 150    Wheelchair 50 feet with 2 turns activity    Assist        Assist Level: Supervision/Verbal cueing   Wheelchair 150 feet activity     Assist      Assist Level: Supervision/Verbal cueing   Blood pressure (!) 152/70, pulse 93, temperature 98.7 F (37.1 C), resp. rate 19, height 6' (1.829 m), weight 85.2 kg, SpO2 95%.  Medical Problem List and Plan: 1. 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   2.  Antithrombotics: -DVT/anticoagulation:  Pharmaceutical: Lovenox 40mg  QD              -antiplatelet therapy: Plavix and ASA x 3 weeks then ASA alone 3. Pain Management: will add Tramadol 50 mg q6pm per pt request- was home dose- and tylenol prn 4. Mood/Behavior/Sleep: trazodone 100 mg at bedtime- is home dose             -antipsychotic agents: n/a  -Melatonin 3mg  at bedtime PRN 5. Neuropsych/cognition: This patient appears capable of making decisions on his own behalf. 6. Skin/Wound Care: routine skin care/prevention 7. Fluids/Electrolytes/Nutrition: monitor I/O and routine labs  -06/24/23 CMP unremarkable today other than stable Cr 1.38, monitor  8. CAD/HTN- had been on Metoprolol at home, however being held due to permissive HTN-  can restart when needed  -06/24/23 BPs permissively elevated, monitor  8-12=13: Blood pressure stable, monitor Vitals:   06/24/23 0340 06/24/23 1214 06/24/23 1321 06/24/23 1750  BP: (!) 158/90 (!) 150/85 (!) 154/96 (!) 150/85   06/24/23 1952 06/25/23 0332 06/25/23 1311 06/25/23 2015  BP: (!) 141/76 136/64 (!) 161/87 (!) 147/67   06/26/23 0600 06/26/23 1308 06/26/23 2100 06/27/23 0502  BP: (!) 154/62 (!) 151/81 (!) 152/71 (!) 152/70    9. CKD3A- baseline Cr 1.2-1.3, admission labs 06/24/23 at baseline, monitor 10. HLD- con't atorvastatin 40 mg daily 11. DM- insulin dependent- on insulin at home- A1c 8.8- con't SSI and Semglee 42 units at bedtime  -06/24/23 CBGs variable, will monitor for trend  8-12: CBGs generally under 200, monitor CBG (last 3)  Recent Labs    06/26/23 1644 06/26/23 2134 06/27/23 0634  GLUCAP 174* 164* 119*    12. Constipation- normal for pt to go q3-4 days at home  -06/24/23 still no BM x3-4d, will start miralax every day and colace 100mg  BID for now; also has senokot 1 tab QHS PRN; monitor 8-12: Small bowel movement yesterday.  Wife reports last adequate bowel movement last Wednesday.  DC Colace, Senokot S1 tab twice daily, MiraLAX twice daily, KUB today.  May be complicating urinary urgency. 8/13: KUB moderate stool  burden. PRN sorbitol today, suppository if no results.  Questionable ideology of linear densities seen on KUB.  8/14:  Sorbitol given without results -  increase sennakot S to 2 tabs BID --> large BM this AM  13. Hypothyroidism- con't synthroid daily.  14. Grade 1 diastolic dysfunction- con't to monitor weights. Will add Daily weights if needed Filed Weights   06/23/23 1230  Weight: 85.2 kg    15. Purewick/urgent incontinence- will stop usage today, pt will attempt urinal  8-12: Documented is mostly incontinent, however did have continent urination this morning.  Initiate PVRs, timed voiding.   - continent, monitor; given significant stool burden will likely improve once BM      LOS: 4 days A FACE TO FACE EVALUATION WAS PERFORMED  Hunter Sparks 06/27/2023, 8:13 AM

## 2023-06-27 NOTE — Progress Notes (Signed)
Physical Therapy Session Note  Patient Details  Name: Hunter Sparks MRN: [id number] Date of Birth: [birth year]  Today's Date: 06/27/2023 PT Individual Time: 1400-1430 PT Individual Time Calculation (min): 30 min   Short Term Goals: Week 1:  PT Short Term Goal 1 (Week 1): pt will maintain dynamic seated balance with min A PT Short Term Goal 2 (Week 1): Pt will perform bed to chair transfer with LRAD and min A PT Short Term Goal 3 (Week 1): pt will ambulate 50 feet with LRAD and CGA  Skilled Therapeutic Interventions/Progress Updates:    Pt received seated in [facility] and agrees to therapy. No complaint of pain. WC transport to gym for time management. Pt performs sit to stand and stand pivot transfer to mat with modA and cues for sequencing and positioning. Pt performs sit to stand from mat with mirror for visual feedback. PT provides modA and blocking of Lt knee for stability, as well as promoting symmetrical WB and neutral posture. Pt completes x8 RLE lifts to promote Lt lateral weight shifting and WB through LLE. Following rest breaks, pt ambulates x45' with modA and no AD, with PT positioned on Lt side and promoting stability, upright posture, lateral weight shifting, and consistent verbal cues for progression of LLE. Following extended seated rest break, pt ambulates additional x30', but requires up to maxA with fatigue and more difficulty with advancement of LLE. WC transport back to room. Left seated with alarm intact and all needs within reach.   Therapy Documentation Precautions:  Precautions Precautions: Fall Precaution Comments: L Hemi Restrictions Weight Bearing Restrictions: No   Therapy/Group: Individual Therapy  Beau Fanny, PT, DPT 06/27/2023, 5:01 PM

## 2023-06-27 NOTE — Progress Notes (Signed)
Occupational Therapy Session Note  Patient Details  Name: Hunter Sparks MRN: [id number] Date of Birth: [birth year]  Today's Date: 06/27/2023 OT Individual Time: 2952-8413 OT Individual Time Calculation (min): 30 min    Short Term Goals: Week 1:  OT Short Term Goal 1 (Week 1): The pt will complete LB dressing with MinA  @ 95% safe incorporating AE as needed OT Short Term Goal 2 (Week 1): The patient will complete tub/shower unit transfer with MinA @ 95% safe using the grab bars for addiitonal balance. OT Short Term Goal 3 (Week 1): The pt will complete sit to stand using the RW with MinA at 95% safe. OT Short Term Goal 4 (Week 1): The pt will be demonstrate the use of AE, for LB task performance after initial demonstrate and v/c's with effective carryover at 95% safe. OT Short Term Goal 5 (Week 1): The pt will improve UB strength to 5/5 MMT for safe and Ind functional with BADL related task at 95% safe. Week 2:     Skilled Therapeutic Interventions/Progress Updates:    1:1 Pt received in the w/c. Focus on NMR with transfers ,sit to stands and standing balance. Pt able to transfer with min A to the mat with a squat pivot with extra time and cues for sequence. Pt performed sit to stand with strong leaning and pushing to the left with difficulty correcting it with verbal cues. Performed task of reaching for items suck to mirror to the right of him slightly out of reach- allowing for weight shifts to the right and then returning to midline without pushing to the left- facilitation for upright trunk posture and max A for awareness of loss of balance to the left. Pt transferred back into w/c with min to the left with 3 squats. Pt propelled half way back to his room with bilateral feet. Left sitting up in w/c with safety belt and call bell.   Therapy Documentation Precautions:  Precautions Precautions: Fall Precaution Comments: L Hemi Restrictions Weight Bearing Restrictions: No  Pain:  No c/o  pain in session   Therapy/Group: Individual Therapy  Roney Mans [facility] 06/27/2023, 3:12 PM

## 2023-06-27 NOTE — Plan of Care (Signed)
  Problem: Consults Goal: RH STROKE PATIENT EDUCATION Description: See Patient Education module for education specifics  Outcome: Progressing Goal: Nutrition Consult-if indicated Outcome: Progressing Goal: Diabetes Guidelines if Diabetic/Glucose > 140 Description: If diabetic or lab glucose is > 140 mg/dl - Initiate Diabetes/Hyperglycemia Guidelines & Document Interventions  Outcome: Progressing   Problem: RH BOWEL ELIMINATION Goal: RH STG MANAGE BOWEL WITH ASSISTANCE Description: STG Manage Bowel with min Assistance. Outcome: Progressing Goal: RH STG MANAGE BOWEL W/MEDICATION W/ASSISTANCE Description: STG Manage Bowel with Medication with min Assistance. Outcome: Progressing   Problem: RH BLADDER ELIMINATION Goal: RH STG MANAGE BLADDER WITH ASSISTANCE Description: STG Manage Bladder With min Assistance Outcome: Progressing Goal: RH STG MANAGE BLADDER WITH MEDICATION WITH ASSISTANCE Description: STG Manage Bladder With Medication With min Assistance. Outcome: Progressing Goal: RH STG MANAGE BLADDER WITH EQUIPMENT WITH ASSISTANCE Description: STG Manage Bladder With Equipment With min Assistance Outcome: Progressing   Problem: RH SKIN INTEGRITY Goal: RH STG SKIN FREE OF INFECTION/BREAKDOWN Description: Skin will remain intact and free of infection/breakdown with min assist  Outcome: Progressing Goal: RH STG MAINTAIN SKIN INTEGRITY WITH ASSISTANCE Description: STG Maintain Skin Integrity With min Assistance. Outcome: Progressing   Problem: RH SAFETY Goal: RH STG ADHERE TO SAFETY PRECAUTIONS W/ASSISTANCE/DEVICE Description: STG Adhere to Safety Precautions With cueing Assistance/Device. Outcome: Progressing Goal: RH STG DECREASED RISK OF FALL WITH ASSISTANCE Description: STG Decreased Risk of Fall With cueing Assistance. Outcome: Progressing   Problem: RH COGNITION-NURSING Goal: RH STG ANTICIPATES NEEDS/CALLS FOR ASSIST W/ASSIST/CUES Description: STG Anticipates  Needs/Calls for Assist With cueing Assistance/Cues. Outcome: Progressing   Problem: RH PAIN MANAGEMENT Goal: RH STG PAIN MANAGED AT OR BELOW PT'S PAIN GOAL Description: Pain will be managed 4 out of 10 on pain scale with PRN medications min assist  Outcome: Progressing

## 2023-06-28 DIAGNOSIS — I639 Cerebral infarction, unspecified: Secondary | ICD-10-CM | POA: Diagnosis not present

## 2023-06-28 LAB — GLUCOSE, CAPILLARY
Glucose-Capillary: 148 mg/dL — ABNORMAL HIGH (ref 70–99)
Glucose-Capillary: 195 mg/dL — ABNORMAL HIGH (ref 70–99)
Glucose-Capillary: 171 mg/dL — ABNORMAL HIGH (ref 70–99)
Glucose-Capillary: 162 mg/dL — ABNORMAL HIGH (ref 70–99)

## 2023-06-28 MED ORDER — INSULIN GLARGINE-YFGN 100 UNIT/ML ~~LOC~~ SOLN
45.0000 [IU] | Freq: Every day | SUBCUTANEOUS | Status: DC
  Administered 2023-06-28 – 2023-07-02 (×5): 45 [IU] via SUBCUTANEOUS
  Filled 2023-06-28 (×5): qty 0.45

## 2023-06-28 NOTE — Progress Notes (Signed)
Physical Therapy Session Note  Patient Details  Name: Hunter Sparks MRN: [id number] Date of Birth: 1946-07-06  Today's Date: 06/28/2023 PT Individual Time: 4098-1191 + 4782-9562 PT Individual Time Calculation (min): 43 min + 44 min  Short Term Goals: Week 1:  PT Short Term Goal 1 (Week 1): pt will maintain dynamic seated balance with min A PT Short Term Goal 2 (Week 1): Pt will perform bed to chair transfer with LRAD and min A PT Short Term Goal 3 (Week 1): pt will ambulate 50 feet with LRAD and CGA  Skilled Therapeutic Interventions/Progress Updates:     SESSION1: Pt presents in room in L sidelying in bed, asleep, awakens slowly but agreeable to PT with increased time to wake up. Pt denies pain. Session focused on NMR in supine and transfer training to promote improved independence with functional mobility, transfers, sitting balance, and standing balance.  Pt completes bed mobility slowly with poor sequencing requires min asssist for rolling to R and preventing posterior LOB from a flat bed. Pt completes squat pivot requires max verbal cues and education on leaning with pt demonstrating poor carryover from previous sessions about correcting L lateral lean. Pt transported to main gym dependently for time management.  Pt completes squat pivot to R, increased time due to poor motor planning requires multiple attempts and verbal cues for sequencing, completes with mod assist.  Pt completes sit to supine with supervision and increased time. Pt completes supine NMR for BLE coordination, muscle fiber recruitment needed for functional transfers, seat/standing balance, functional ambulation including: - supine marches BLE x10 (AAROM for BLEs to facilitate full ROM) - bent knee fall out LLE only x10 - SLR BLE x10 AAROM more significantly for LLE to achieve full ROM as well as facilitate muscle fiber recruitment throughout full ROM - bridges x10 - single leg bridge LLE x5 (MAX verbal cues for  sequencing and motor planning)  *pt requires verbal cues for all above exercises for sequencing and attendance to task.  Pt completes supine to sit from mat with max step by step verbal cues supervision. Pt completes squat pivot transfer multiple attempts max cues for sequencing mod assist to L.  Pt returns to room and remains seated in [facility] with all needs within reach, cal light in place and chair alarm donned and activated at end of session.   SESSION 2: Pt presents in room in [facility], attempting to self propel to sink to comb hair however demonstrating difficulty navigating obstacle. Therapist assist pt to sink and pt provided with education on calling for assistance with pt reporting "would rather do it myself." Education reinforced throughout session. Session focused on therapeutic activities for education on safety awareness and current deficits as well as gait training.  Pt transported to day room then main gym dependently for time management. Pt completes squat pivot transfer with max verbal cues but min assist to L. Pt ambulates with swedish walker 2x62' with mod/max assist for managing device with pt demonstrating path deviation to L, 2nd person assist with bringing WC to sit. Pt provided with seated rest breaks between all gait trials and exercises to promote energy conservation and quality with tasks.  Pt returns to room and remains seated in [facility] with all needs within reach, cal light in place and chair alarm donned and activated at end of session.    Therapy Documentation Precautions:  Precautions Precautions: Fall Precaution Comments: L Hemi Restrictions Weight Bearing Restrictions: No    Therapy/Group: Individual Therapy  Edwin Cap PT, DPT 06/28/2023, 9:38 AM

## 2023-06-28 NOTE — Plan of Care (Signed)
Problem: RH BOWEL ELIMINATION Goal: RH STG MANAGE BOWEL WITH ASSISTANCE Description: STG Manage Bowel with min Assistance. 06/28/2023 1621 by Elgie Congo, LPN Outcome: Progressing 06/28/2023 1331 by Elgie Congo, LPN Outcome: Progressing Goal: RH STG MANAGE BOWEL W/MEDICATION W/ASSISTANCE Description: STG Manage Bowel with Medication with min Assistance. 06/28/2023 1621 by Elgie Congo, LPN Outcome: Progressing 06/28/2023 1331 by Tilden Dome A, LPN Outcome: Progressing   Problem: RH BLADDER ELIMINATION Goal: RH STG MANAGE BLADDER WITH ASSISTANCE Description: STG Manage Bladder With min Assistance 06/28/2023 1621 by Elgie Congo, LPN Outcome: Progressing 06/28/2023 1331 by Elgie Congo, LPN Outcome: Progressing Goal: RH STG MANAGE BLADDER WITH MEDICATION WITH ASSISTANCE Description: STG Manage Bladder With Medication With min Assistance. 06/28/2023 1621 by Elgie Congo, LPN Outcome: Progressing 06/28/2023 1331 by Elgie Congo, LPN Outcome: Progressing Goal: RH STG MANAGE BLADDER WITH EQUIPMENT WITH ASSISTANCE Description: STG Manage Bladder With Equipment With min Assistance 06/28/2023 1621 by Elgie Congo, LPN Outcome: Progressing 06/28/2023 1331 by Tilden Dome A, LPN Outcome: Progressing   Problem: RH SKIN INTEGRITY Goal: RH STG SKIN FREE OF INFECTION/BREAKDOWN Description: Skin will remain intact and free of infection/breakdown with min assist  06/28/2023 1621 by Elgie Congo, LPN Outcome: Progressing 06/28/2023 1331 by Elgie Congo, LPN Outcome: Progressing Goal: RH STG MAINTAIN SKIN INTEGRITY WITH ASSISTANCE Description: STG Maintain Skin Integrity With min Assistance. 06/28/2023 1621 by Elgie Congo, LPN Outcome: Progressing 06/28/2023 1331 by Elgie Congo, LPN Outcome: Progressing   Problem: RH SAFETY Goal: RH STG ADHERE TO SAFETY PRECAUTIONS W/ASSISTANCE/DEVICE Description: STG Adhere to Safety Precautions With cueing  Assistance/Device. 06/28/2023 1621 by Elgie Congo, LPN Outcome: Progressing 06/28/2023 1331 by Elgie Congo, LPN Outcome: Progressing Goal: RH STG DECREASED RISK OF FALL WITH ASSISTANCE Description: STG Decreased Risk of Fall With cueing Assistance. 06/28/2023 1621 by Elgie Congo, LPN Outcome: Progressing 06/28/2023 1331 by Elgie Congo, LPN Outcome: Progressing   Problem: RH COGNITION-NURSING Goal: RH STG ANTICIPATES NEEDS/CALLS FOR ASSIST W/ASSIST/CUES Description: STG Anticipates Needs/Calls for Assist With cueing Assistance/Cues. 06/28/2023 1621 by Tilden Dome A, LPN Outcome: Progressing 06/28/2023 1331 by Elgie Congo, LPN Outcome: Progressing   Problem: RH PAIN MANAGEMENT Goal: RH STG PAIN MANAGED AT OR BELOW PT'S PAIN GOAL Description: Pain will be managed 4 out of 10 on pain scale with PRN medications min assist  06/28/2023 1621 by Tilden Dome A, LPN Outcome: Progressing 06/28/2023 1331 by Tilden Dome A, LPN Outcome: Progressing   Problem: RH KNOWLEDGE DEFICIT Goal: RH STG INCREASE KNOWLEDGE OF DIABETES Description: Patient/caregiver will be able to managed diabetes medications and diet modifications to improve A1C of 8.8 from nursing education and nursing handouts independently  06/28/2023 1621 by Elgie Congo, LPN Outcome: Progressing 06/28/2023 1331 by Tilden Dome A, LPN Outcome: Progressing Goal: RH STG INCREASE KNOWLEDGE OF HYPERTENSION Description: Patient/caregiver will be able to manage blood pressure medications and diet/lifestyle modifications from nursing education and nursing handouts independently  06/28/2023 1621 by Elgie Congo, LPN Outcome: Progressing 06/28/2023 1331 by Tilden Dome A, LPN Outcome: Progressing Goal: RH STG INCREASE KNOWLEGDE OF HYPERLIPIDEMIA Description: Patient/caregiver will be able to manage cholesterol medications and improve cholesterol levels from diet/lifestyle modifications from nursing handouts and  nursing education independently  06/28/2023 1621 by Elgie Congo, LPN Outcome: Progressing 06/28/2023 1331 by Elgie Congo, LPN Outcome: Progressing Goal: RH STG INCREASE KNOWLEDGE OF STROKE PROPHYLAXIS Description: Patient/caregiver will be able to manage stroke medications (ASA/Plavix) from nursing  education and nursing handouts independently  06/28/2023 1621 by Elgie Congo, LPN Outcome: Progressing 06/28/2023 1331 by Elgie Congo, LPN Outcome: Progressing

## 2023-06-28 NOTE — Progress Notes (Signed)
Physical Therapy Session Note  Patient Details  Name: Hunter Sparks MRN: [id number] Date of Birth: September 07, 1946  Today's Date: 06/28/2023 PT Individual Time: 1313-1400 PT Individual Time Calculation (min): 47 min   Short Term Goals: Week 1:  PT Short Term Goal 1 (Week 1): pt will maintain dynamic seated balance with min A PT Short Term Goal 2 (Week 1): Pt will perform bed to chair transfer with LRAD and min A PT Short Term Goal 3 (Week 1): pt will ambulate 50 feet with LRAD and CGA  Skilled Therapeutic Interventions/Progress Updates:    Pt up in w/c and agreeable to session but reports being frustrated by multiple things "not working" (ex. Tv, cell phone, bed) but did not elaborate on what exactly wasn't working when offering to attempt to problem solve. Pt reports feeling like he hasn't made a whole lot of progress. Encouragement provided.   Focused on reciprocal movement pattern retraining to simulate carryover for gait using BLE to propel w/c (with assist from UE's for power) and coordination through BLE x 150' with supervision and extra time due to decreased coordination. Sit <> stands at hi-lo table with focus on achieving midline and maintaining midline posture during functional dynamic reaching activity using a visual cue (tape on table in front of patient to cue head and hips in line with the tape). Pt requiring overall min to mod assist for balance and for sit <> stands dependent on how much pt pushing though generally able to correct with verbal and tactile cues.   Progressed NMR to gait in parallel bars with focus on postural control retraining, gait, coordination and control in LLE during gait with overall min assist with facilitation for upright posture and reorientation to midline x 10' forward and 10' retro x 4 reps total with rest breaks between sets due to reported fatigue.   End of session setup in room with safety belt donned and all needs in reach.   Therapy  Documentation Precautions:  Precautions Precautions: Fall Precaution Comments: L Hemi Restrictions Weight Bearing Restrictions: No    Pain:  Denies pain.    Therapy/Group: Individual Therapy  Karolee Stamps Darrol Poke, PT, DPT, CBIS  06/28/2023, 2:17 PM

## 2023-06-28 NOTE — Progress Notes (Signed)
Occupational Therapy Session Note  Patient Details  Name: Hunter Sparks MRN: [id number] Date of Birth: [birth year]  Today's Date: 06/28/2023 OT Individual Time: 1103-1200 OT Individual Time Calculation (min): 57 min    Short Term Goals: Week 1:  OT Short Term Goal 1 (Week 1): The pt will complete LB dressing with MinA  @ 95% safe incorporating AE as needed OT Short Term Goal 2 (Week 1): The patient will complete tub/shower unit transfer with MinA @ 95% safe using the grab bars for addiitonal balance. OT Short Term Goal 3 (Week 1): The pt will complete sit to stand using the RW with MinA at 95% safe. OT Short Term Goal 4 (Week 1): The pt will be demonstrate the use of AE, for LB task performance after initial demonstrate and v/c's with effective carryover at 95% safe. OT Short Term Goal 5 (Week 1): The pt will improve UB strength to 5/5 MMT for safe and Ind functional with BADL related task at 95% safe.  Skilled Therapeutic Interventions/Progress Updates:    Pt greeted seated in wc and agreeable to OT Treatment session. Pt brought to therapy gym in wc.  Utilized BITS to work on attention, memory, functional use of L UE, and visual scanning.  Speed Dot Test -94%accuracy -Pt demonstrating decreased reaction time and some L inattention.    Memory Trial -Mid difficulty with simple sequencing tasks -80% accuracy with 4 successful trials (min difficulty) -75% accuracy with 5 successful trials (min difficulty) -Pt demonstrating increased difficulty with sequencing grading up trials   Bell Cancellation Test -2 misses, 2 accidentals -Pt able to scan and find bells within picture with increased time.   Addressed standing balance/endurance along with functional use of L UE with Squiggz reaching activity in standing. Pt with motor impersistents  and pushing to the L. Worked on correcting pushing with reaching to the R and facilitation for weight shift onto R LE. Overall mod to max A for dynamic  standing activity. Upon multiple trials of activity, pt with much improved balance and was able to maintain standing with only min A.    Pt returned to room and was left seated in wc with alarm belt on, call bell in reach, and needs met.   Therapy Documentation Precautions:  Precautions Precautions: Fall Precaution Comments: L Hemi Restrictions Weight Bearing Restrictions: No Pain: Pain Assessment Pain Scale: 0-10 Pain Score: 0-No pain   Therapy/Group: Individual Therapy  [name] 06/28/2023, 11:30 AM

## 2023-06-28 NOTE — Progress Notes (Signed)
PROGRESS NOTE   Subjective/Complaints:  No acute complaints.  No events overnight.   Therapies asking for SLP consult this a.m. due to ongoing cognitive deficits, confusion.  Patient endorsing he slept poorly overnight due to discomfort with hospital bed, does not generally have difficulty sleeping, does not use sleep medication at baseline.  ROS: as per HPI. Denies CP, SOB, abd pain, N/V/D, or any other complaints at this time.    Objective:   No results found. No results for input(s): "WBC", "HGB", "HCT", "PLT" in the last 72 hours.  No results for input(s): "NA", "K", "CL", "CO2", "GLUCOSE", "BUN", "CREATININE", "CALCIUM" in the last 72 hours.   Intake/Output Summary (Last 24 hours) at 06/28/2023 0929 Last data filed at 06/28/2023 0500 Gross per 24 hour  Intake 240 ml  Output 275 ml  Net -35 ml        Physical Exam: Vital Signs Blood pressure (!) 141/78, pulse 98, temperature 98.6 F (37 C), temperature source Oral, resp. rate 16, height 6' (1.829 m), weight 85.2 kg, SpO2 96%.   PE: Constitution: Appropriate appearance for age. No apparent distress.   Resp: No respiratory distress. No accessory muscle usage. on RA and CTAB Cardio: Well perfused appearance. No peripheral edema.  Regular rate and rhythm. Abdomen: Nondistended. Nontender.   Psych: Appropriate mood and affect. Neuro: AAOx2 to self and place, not oriented to time, partially with cueing.  Neurologic Exam:   Sensory exam: revealed normal sensation in all dermatomal regions in bilateral upper extremities and bilateral lower extremities Motor exam:  RUE 5/5 LUE- 5-/5 throughout RLE- 5/5  LLE- 4+/5 in HF/KE/KF and 5-/5 in DF/PF  MSK: Bilateral hand Dupuytren's contractures   Assessment/Plan: 1. Functional deficits which require 3+ hours per day of interdisciplinary therapy in a comprehensive inpatient rehab setting. Physiatrist is providing  close team supervision and 24 hour management of active medical problems listed below. Physiatrist and rehab team continue to assess barriers to discharge/monitor patient progress toward functional and medical goals  Care Tool:  Bathing    Body parts bathed by patient: Right arm, Left arm, Chest, Abdomen, Front perineal area, Buttocks, Right upper leg, Face, Left upper leg, Right lower leg, Left lower leg         Bathing assist Assist Level: Contact Guard/Touching assist (CGA to MinA)     Upper Body Dressing/Undressing Upper body dressing   What is the patient wearing?: Pull over shirt    Upper body assist Assist Level: Set up assist    Lower Body Dressing/Undressing Lower body dressing      What is the patient wearing?: Pants     Lower body assist Assist for lower body dressing: Contact Guard/Touching assist (CGA to MinA secondary to challenges with functional balance)     Toileting Toileting    Toileting assist Assist for toileting: Supervision/Verbal cueing (S without cues)     Transfers Chair/bed transfer  Transfers assist  Chair/bed transfer activity did not occur: Safety/medical concerns  Chair/bed transfer assist level: Maximal Assistance - Patient 25 - 49%     Locomotion Ambulation   Ambulation assist      Assist level: Moderate Assistance - Patient 50 -  74% Assistive device: Walker-rolling Max distance: 15   Walk 10 feet activity   Assist     Assist level: Moderate Assistance - Patient - 50 - 74% Assistive device: Walker-rolling   Walk 50 feet activity   Assist Walk 50 feet with 2 turns activity did not occur: Safety/medical concerns         Walk 150 feet activity   Assist Walk 150 feet activity did not occur: Safety/medical concerns         Walk 10 feet on uneven surface  activity   Assist Walk 10 feet on uneven surfaces activity did not occur: Safety/medical concerns         Wheelchair     Assist Is the  patient using a wheelchair?: Yes Type of Wheelchair: Manual    Wheelchair assist level: Supervision/Verbal cueing Max wheelchair distance: 150    Wheelchair 50 feet with 2 turns activity    Assist        Assist Level: Supervision/Verbal cueing   Wheelchair 150 feet activity     Assist      Assist Level: Supervision/Verbal cueing   Blood pressure (!) 141/78, pulse 98, temperature 98.6 F (37 C), temperature source Oral, resp. rate 16, height 6' (1.829 m), weight 85.2 kg, SpO2 96%.  Medical Problem List and Plan: 1. 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   2.  Antithrombotics: -DVT/anticoagulation:  Pharmaceutical: Lovenox 40mg  QD             -antiplatelet therapy: Plavix and ASA x 3 weeks then ASA alone 3. Pain Management: will add Tramadol 50 mg q6pm per pt request- was home dose- and tylenol prn 4. Mood/Behavior/Sleep: trazodone 100 mg at bedtime- is home dose             -antipsychotic agents: n/a  -Melatonin 3mg  at bedtime PRN 5. Neuropsych/cognition: This patient appears capable of making decisions on his own behalf.  -8-14: SLP consulted for ongoing cognitive deficits 6. Skin/Wound Care: routine skin care/prevention 7. Fluids/Electrolytes/Nutrition: monitor I/O and routine labs  -06/24/23 CMP unremarkable today other than stable Cr 1.38, monitor  -8-14: Mild cognitive deficit, will get labs in a.m. to ensure no metabolic or infectious causes.  No apparent sources.  8. CAD/HTN- had been on Metoprolol at home, however being held due to permissive HTN- can restart when needed  -So far within parameters of permissive hypertension,  overall downtrending into 130s to 140s, monitor   Vitals:   06/24/23 1750 06/24/23 1952 06/25/23 0332 06/25/23 1311  BP: (!) 150/85 (!) 141/76 136/64 (!) 161/87   06/25/23 2015 06/26/23 0600 06/26/23 1308 06/26/23 2100  BP: (!) 147/67 (!) 154/62 (!) 151/81 (!) 152/71   06/27/23 0502 06/27/23 1345 06/27/23 2057 06/28/23 0506  BP: (!) 152/70 (!) 156/85 138/85 (!) 141/78    9. CKD3A- baseline Cr 1.2-1.3, admission labs 06/24/23 at baseline, monitor 10. HLD- con't atorvastatin 40 mg daily 11. 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 Constipation- normal for pt to go q3-4 days at home  -06/24/23 still no BM x3-4d, will start miralax every day and colace 100mg  BID for now; also has senokot 1 tab QHS PRN; monitor 8-12: Small bowel movement yesterday.  Wife reports last adequate bowel movement last Wednesday.  DC Colace, Senokot S1 tab twice daily, MiraLAX twice daily, KUB today.  May be complicating urinary urgency. 8/13: KUB moderate stool burden. PRN sorbitol today, suppository if no results.  Questionable ideology of linear densities seen on KUB.  8/14:  Sorbitol given without results -  increase sennakot S to 2 tabs BID --> large BM this AM   13. Hypothyroidism- con't synthroid daily.  14. Grade 1 diastolic dysfunction- con't to monitor weights. Will add Daily weights if needed Filed Weights   06/23/23 1230  Weight: 85.2 kg    15. Purewick/urgent incontinence- will stop usage today, pt will attempt urinal  8-12: Documented is mostly incontinent, however did have continent urination this morning.  Initiate PVRs, timed voiding.   - continent, monitor; given significant stool burden will likely improve  once BM  No documented incontinence since bowel movement 8/14, monitor    LOS: 5 days A FACE TO FACE EVALUATION WAS PERFORMED  [name] 06/28/2023, 9:29 AM

## 2023-06-28 NOTE — Plan of Care (Signed)
  Problem: Consults Goal: RH STROKE PATIENT EDUCATION Description: See Patient Education module for education specifics  Outcome: Progressing Goal: Nutrition Consult-if indicated Outcome: Progressing Goal: Diabetes Guidelines if Diabetic/Glucose > 140 Description: If diabetic or lab glucose is > 140 mg/dl - Initiate Diabetes/Hyperglycemia Guidelines & Document Interventions  Outcome: Progressing   Problem: RH BOWEL ELIMINATION Goal: RH STG MANAGE BOWEL WITH ASSISTANCE Description: STG Manage Bowel with min Assistance. Outcome: Progressing Goal: RH STG MANAGE BOWEL W/MEDICATION W/ASSISTANCE Description: STG Manage Bowel with Medication with min Assistance. Outcome: Progressing   Problem: RH BLADDER ELIMINATION Goal: RH STG MANAGE BLADDER WITH ASSISTANCE Description: STG Manage Bladder With min Assistance Outcome: Progressing Goal: RH STG MANAGE BLADDER WITH MEDICATION WITH ASSISTANCE Description: STG Manage Bladder With Medication With min Assistance. Outcome: Progressing Goal: RH STG MANAGE BLADDER WITH EQUIPMENT WITH ASSISTANCE Description: STG Manage Bladder With Equipment With min Assistance Outcome: Progressing   Problem: RH SKIN INTEGRITY Goal: RH STG SKIN FREE OF INFECTION/BREAKDOWN Description: Skin will remain intact and free of infection/breakdown with min assist  Outcome: Progressing Goal: RH STG MAINTAIN SKIN INTEGRITY WITH ASSISTANCE Description: STG Maintain Skin Integrity With min Assistance. Outcome: Progressing   Problem: RH SAFETY Goal: RH STG ADHERE TO SAFETY PRECAUTIONS W/ASSISTANCE/DEVICE Description: STG Adhere to Safety Precautions With cueing Assistance/Device. Outcome: Progressing Goal: RH STG DECREASED RISK OF FALL WITH ASSISTANCE Description: STG Decreased Risk of Fall With cueing Assistance. Outcome: Progressing   Problem: RH COGNITION-NURSING Goal: RH STG ANTICIPATES NEEDS/CALLS FOR ASSIST W/ASSIST/CUES Description: STG Anticipates  Needs/Calls for Assist With cueing Assistance/Cues. Outcome: Progressing   Problem: RH PAIN MANAGEMENT Goal: RH STG PAIN MANAGED AT OR BELOW PT'S PAIN GOAL Description: Pain will be managed 4 out of 10 on pain scale with PRN medications min assist  Outcome: Progressing   Problem: RH KNOWLEDGE DEFICIT Goal: RH STG INCREASE KNOWLEDGE OF DIABETES Description: Patient/caregiver will be able to managed diabetes medications and diet modifications to improve A1C of 8.8 from nursing education and nursing handouts independently  Outcome: Progressing Goal: RH STG INCREASE KNOWLEDGE OF HYPERTENSION Description: Patient/caregiver will be able to manage blood pressure medications and diet/lifestyle modifications from nursing education and nursing handouts independently  Outcome: Progressing Goal: RH STG INCREASE KNOWLEGDE OF HYPERLIPIDEMIA Description: Patient/caregiver will be able to manage cholesterol medications and improve cholesterol levels from diet/lifestyle modifications from nursing handouts and nursing education independently  Outcome: Progressing Goal: RH STG INCREASE KNOWLEDGE OF STROKE PROPHYLAXIS Description: Patient/caregiver will be able to manage stroke medications (ASA/Plavix) from nursing education and nursing handouts independently  Outcome: Progressing

## 2023-06-28 NOTE — Plan of Care (Signed)
  Problem: Consults Goal: RH STROKE PATIENT EDUCATION Description: See Patient Education module for education specifics  Outcome: Progressing Goal: Nutrition Consult-if indicated Outcome: Progressing Goal: Diabetes Guidelines if Diabetic/Glucose > 140 Description: If diabetic or lab glucose is > 140 mg/dl - Initiate Diabetes/Hyperglycemia Guidelines & Document Interventions  Outcome: Progressing   Problem: RH BOWEL ELIMINATION Goal: RH STG MANAGE BOWEL WITH ASSISTANCE Description: STG Manage Bowel with min Assistance. Outcome: Progressing   Problem: RH BLADDER ELIMINATION Goal: RH STG MANAGE BLADDER WITH ASSISTANCE Description: STG Manage Bladder With min Assistance Outcome: Progressing Goal: RH STG MANAGE BLADDER WITH MEDICATION WITH ASSISTANCE Description: STG Manage Bladder With Medication With min Assistance. Outcome: Progressing Goal: RH STG MANAGE BLADDER WITH EQUIPMENT WITH ASSISTANCE Description: STG Manage Bladder With Equipment With min Assistance Outcome: Progressing   Problem: RH COGNITION-NURSING Goal: RH STG ANTICIPATES NEEDS/CALLS FOR ASSIST W/ASSIST/CUES Description: STG Anticipates Needs/Calls for Assist With cueing Assistance/Cues. Outcome: Progressing

## 2023-06-29 ENCOUNTER — Inpatient Hospital Stay (HOSPITAL_COMMUNITY): Payer: HMO

## 2023-06-29 DIAGNOSIS — I639 Cerebral infarction, unspecified: Secondary | ICD-10-CM | POA: Diagnosis not present

## 2023-06-29 LAB — CBC
HCT: 40 % (ref 39.0–52.0)
Hemoglobin: 13.9 g/dL (ref 13.0–17.0)
MCH: 30.8 pg (ref 26.0–34.0)
MCHC: 34.8 g/dL (ref 30.0–36.0)
MCV: 88.7 fL (ref 80.0–100.0)
Platelets: 188 10*3/uL (ref 150–400)
RBC: 4.51 MIL/uL (ref 4.22–5.81)
RDW: 12.4 % (ref 11.5–15.5)
WBC: 8.6 10*3/uL (ref 4.0–10.5)
nRBC: 0 % (ref 0.0–0.2)

## 2023-06-29 LAB — GLUCOSE, CAPILLARY
Glucose-Capillary: 92 mg/dL (ref 70–99)
Glucose-Capillary: 145 mg/dL — ABNORMAL HIGH (ref 70–99)
Glucose-Capillary: 203 mg/dL — ABNORMAL HIGH (ref 70–99)
Glucose-Capillary: 173 mg/dL — ABNORMAL HIGH (ref 70–99)

## 2023-06-29 LAB — BASIC METABOLIC PANEL
Anion gap: 12 (ref 5–15)
BUN: 16 mg/dL (ref 8–23)
CO2: 25 mmol/L (ref 22–32)
Calcium: 9 mg/dL (ref 8.9–10.3)
Chloride: 100 mmol/L (ref 98–111)
Creatinine, Ser: 1.3 mg/dL — ABNORMAL HIGH (ref 0.61–1.24)
GFR, Estimated: 56 mL/min — ABNORMAL LOW (ref >60)
Glucose, Bld: 101 mg/dL — ABNORMAL HIGH (ref 70–99)
Potassium: 3.7 mmol/L (ref 3.5–5.1)
Sodium: 137 mmol/L (ref 135–145)

## 2023-06-29 MED ORDER — ONDANSETRON 4 MG PO TBDP
4.0000 mg | ORAL_TABLET | Freq: Three times a day (TID) | ORAL | Status: DC | PRN
  Administered 2023-07-01 – 2023-07-02 (×2): 4 mg via ORAL
  Filled 2023-06-29 (×2): qty 1

## 2023-06-29 MED ORDER — METOPROLOL TARTRATE 12.5 MG HALF TABLET
12.5000 mg | ORAL_TABLET | Freq: Two times a day (BID) | ORAL | Status: DC
  Administered 2023-06-29 – 2023-07-02 (×6): 12.5 mg via ORAL
  Filled 2023-06-29 (×6): qty 1

## 2023-06-29 NOTE — Plan of Care (Signed)
  Problem: Consults Goal: RH STROKE PATIENT EDUCATION Description: See Patient Education module for education specifics  Outcome: Progressing Goal: Nutrition Consult-if indicated Outcome: Progressing Goal: Diabetes Guidelines if Diabetic/Glucose > 140 Description: If diabetic or lab glucose is > 140 mg/dl - Initiate Diabetes/Hyperglycemia Guidelines & Document Interventions  Outcome: Progressing   Problem: RH BOWEL ELIMINATION Goal: RH STG MANAGE BOWEL WITH ASSISTANCE Description: STG Manage Bowel with min Assistance. Outcome: Progressing Goal: RH STG MANAGE BOWEL W/MEDICATION W/ASSISTANCE Description: STG Manage Bowel with Medication with min Assistance. Outcome: Progressing   Problem: RH BLADDER ELIMINATION Goal: RH STG MANAGE BLADDER WITH ASSISTANCE Description: STG Manage Bladder With min Assistance Outcome: Progressing Goal: RH STG MANAGE BLADDER WITH MEDICATION WITH ASSISTANCE Description: STG Manage Bladder With Medication With min Assistance. Outcome: Progressing Goal: RH STG MANAGE BLADDER WITH EQUIPMENT WITH ASSISTANCE Description: STG Manage Bladder With Equipment With min Assistance Outcome: Progressing   Problem: RH SKIN INTEGRITY Goal: RH STG SKIN FREE OF INFECTION/BREAKDOWN Description: Skin will remain intact and free of infection/breakdown with min assist  Outcome: Progressing Goal: RH STG MAINTAIN SKIN INTEGRITY WITH ASSISTANCE Description: STG Maintain Skin Integrity With min Assistance. Outcome: Progressing   Problem: RH SAFETY Goal: RH STG ADHERE TO SAFETY PRECAUTIONS W/ASSISTANCE/DEVICE Description: STG Adhere to Safety Precautions With cueing Assistance/Device. Outcome: Progressing Goal: RH STG DECREASED RISK OF FALL WITH ASSISTANCE Description: STG Decreased Risk of Fall With cueing Assistance. Outcome: Progressing   Problem: RH KNOWLEDGE DEFICIT Goal: RH STG INCREASE KNOWLEDGE OF HYPERTENSION Description: Patient/caregiver will be able to manage  blood pressure medications and diet/lifestyle modifications from nursing education and nursing handouts independently  Outcome: Progressing

## 2023-06-29 NOTE — Progress Notes (Signed)
PROGRESS NOTE   Subjective/Complaints:  No events overnight.  Endorses feelings of generalized illness today, unable to pinpoint precise cause.  He does endorse generalized nausea and feeling like he has been feverish. BMP stable, CBC stable this a.m.  Vitals overnight with some mild hypertension but otherwise good, no fevers.  Single mild desaturation overnight 89%. Ate 50% breakfast this AM; refused POs yesterday, patient states due to generalized nausea.  Endorses he is passing gas, does not feel constipation.  Last bowel movement 8-14, large, however prior to that was significantly constipated on KUB.    ROS: as per HPI.    Objective:   No results found. Recent Labs    06/29/23 0528  WBC 8.6  HGB 13.9  HCT 40.0  PLT 188    Recent Labs    06/29/23 0528  NA 137  K 3.7  CL 100  CO2 25  GLUCOSE 101*  BUN 16  CREATININE 1.30*  CALCIUM 9.0     Intake/Output Summary (Last 24 hours) at 06/29/2023 0827 Last data filed at 06/29/2023 0744 Gross per 24 hour  Intake 300 ml  Output 400 ml  Net -100 ml        Physical Exam: Vital Signs Blood pressure (!) 157/95, pulse 91, temperature 98.3 F (36.8 C), resp. rate 18, height 6' (1.829 m), weight 85.2 kg, SpO2 96%.   PE: Constitution: Appropriate appearance for age.  Mild lethargy, however no apparent distress. Resp: No respiratory distress. No accessory muscle usage. on RA and CTAB Cardio: Well perfused appearance. No peripheral edema.  Regular rate and rhythm. Abdomen: Nondistended. Nontender.  Bowel sounds present and hypoactive. Psych: Appropriate mood and affect. Neuro: AAOx3 , requires some cueing for year but otherwise intact.  Neurologic Exam:   Sensory exam: revealed normal sensation in all dermatomal regions in bilateral upper extremities and bilateral lower extremities Motor exam: Working with therapies, moving all 4 limbs antigravity and against  resistance. RUE 5/5 LUE- 5-/5 throughout RLE- 5/5  LLE- 4+/5 in HF/KE/KF and 5-/5 in DF/PF  MSK: Bilateral hand Dupuytren's contractures   Assessment/Plan: 1. Functional deficits which require 3+ hours per day of interdisciplinary therapy in a comprehensive inpatient rehab setting. Physiatrist is providing close team supervision and 24 hour management of active medical problems listed below. Physiatrist and rehab team continue to assess barriers to discharge/monitor patient progress toward functional and medical goals  Care Tool:  Bathing    Body parts bathed by patient: Right arm, Left arm, Chest, Abdomen, Front perineal area, Buttocks, Right upper leg, Face, Left upper leg, Right lower leg, Left lower leg         Bathing assist Assist Level: Contact Guard/Touching assist (CGA to MinA)     Upper Body Dressing/Undressing Upper body dressing   What is the patient wearing?: Pull over shirt    Upper body assist Assist Level: Set up assist    Lower Body Dressing/Undressing Lower body dressing      What is the patient wearing?: Pants     Lower body assist Assist for lower body dressing: Contact Guard/Touching assist (CGA to MinA secondary to challenges with functional balance)     Toileting Toileting  Toileting assist Assist for toileting: Supervision/Verbal cueing (S without cues)     Transfers Chair/bed transfer  Transfers assist  Chair/bed transfer activity did not occur: Safety/medical concerns  Chair/bed transfer assist level: Maximal Assistance - Patient 25 - 49%     Locomotion Ambulation   Ambulation assist      Assist level: Minimal Assistance - Patient > 75% Assistive device: Parallel bars Max distance: 10'   Walk 10 feet activity   Assist     Assist level: Minimal Assistance - Patient > 75% Assistive device: Parallel bars   Walk 50 feet activity   Assist Walk 50 feet with 2 turns activity did not occur: Safety/medical  concerns         Walk 150 feet activity   Assist Walk 150 feet activity did not occur: Safety/medical concerns         Walk 10 feet on uneven surface  activity   Assist Walk 10 feet on uneven surfaces activity did not occur: Safety/medical concerns         Wheelchair     Assist Is the patient using a wheelchair?: Yes Type of Wheelchair: Manual    Wheelchair assist level: Supervision/Verbal cueing Max wheelchair distance: 150'    Wheelchair 50 feet with 2 turns activity    Assist        Assist Level: Supervision/Verbal cueing   Wheelchair 150 feet activity     Assist      Assist Level: Supervision/Verbal cueing   Blood pressure (!) 157/95, pulse 91, temperature 98.3 F (36.8 C), resp. rate 18, height 6' (1.829 m), weight 85.2 kg, SpO2 96%.  Medical Problem List and Plan: 1. 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   2.  Antithrombotics: -DVT/anticoagulation:  Pharmaceutical: Lovenox 40mg  QD             -antiplatelet therapy: Plavix and ASA x 3 weeks then ASA alone 3. Pain Management: will add Tramadol 50 mg q6pm per pt request- was home dose- and tylenol prn 4. Mood/Behavior/Sleep: trazodone 100 mg at bedtime- is home dose             -antipsychotic agents: n/a  -Melatonin 3mg  at bedtime PRN 5. Neuropsych/cognition: This patient appears capable of making decisions on his own behalf.  -8-14: SLP consulted for ongoing cognitive deficits 6. Skin/Wound Care: routine skin care/prevention 7. Fluids/Electrolytes/Nutrition: monitor I/O and routine labs  -06/24/23 CMP unremarkable today other than stable Cr 1.38,  monitor  -8-14: Mild cognitive deficit, will get labs in a.m. to ensure no metabolic or infectious causes.  No apparent sources.  8/16: Poor PO intakes yesterday; feeling generally ill today.  As needed Zofran 4 mg every 8 hours.  8. CAD/HTN- had been on Metoprolol 25 mg twice daily and Cozaar milligrams daily at home, however being held due to permissive HTN- can restart when needed  -So far within parameters of permissive hypertension, will start gradually with low-dose metoprolol 12.5 mg twice daily 8-16 and titrate as appropriate   Vitals:   06/25/23 2015 06/26/23  0600 06/26/23 1308 06/26/23 2100  BP: (!) 147/67 (!) 154/62 (!) 151/81 (!) 152/71   06/27/23 0502 06/27/23 1345 06/27/23 2057 06/28/23 0506  BP: (!) 152/70 (!) 156/85 138/85 (!) 141/78   06/28/23 1441 06/28/23 1748 06/28/23 1941 06/29/23 0507  BP: (!) 140/74 (!) 147/82 (!) 174/91 (!) 157/95    9. CKD3A- baseline Cr 1.2-1.3, admission labs 06/24/23 at baseline, monitor  Cr baseline; monitor 10. HLD- con't atorvastatin 40 mg daily 11. DM- insulin dependent- on insulin at home- A1c 8.8- con't SSI and Semglee 42 units at bedtime  -06/24/23 CBGs variable, will monitor for trend  8-12: CBGs generally under 200, monitor  8-15: Increase Semglee to 45 units - improved  CBG (last 3)  Recent Labs    06/28/23 1645 06/28/23 2117 06/29/23 0636  GLUCAP 171* 162* 92    12. Constipation- normal for pt to go q3-4 days at home  -06/24/23 still no BM x3-4d, will start miralax every day and colace 100mg  BID for now; also has senokot 1 tab QHS PRN; monitor 8-12: Small bowel movement yesterday.  Wife reports last adequate bowel movement last Wednesday.  DC Colace, Senokot S1 tab twice daily, MiraLAX twice daily, KUB today.  May be complicating urinary urgency. 8/13: KUB moderate stool burden. PRN sorbitol today, suppository if no results.  Questionable ideology of linear densities seen on KUB.  8/14:  Sorbitol given without results -   increase sennakot S to 2 tabs BID --> large BM this AM 8-15: Feelings of generalized illness, nausea, and poor p.o.'s.  KUB today to assess stool burden, nursing informed to give as needed sorbitol and suppository if moderate to high.   13. Hypothyroidism- con't synthroid daily.  14. Grade 1 diastolic dysfunction- con't to monitor weights. Will add Daily weights if needed Filed Weights   06/23/23 1230  Weight: 85.2 kg    15. Purewick/urgent incontinence- will stop usage today, pt will attempt urinal  8-12: Documented is mostly incontinent, however did have continent urination this morning.  Initiate PVRs, timed voiding.   - continent, monitor; given significant stool burden will likely improve once BM  No documented incontinence since bowel movement 8/14, monitor  16.  Overnight desaturation 89%.  Given generalized feelings of illness today, will get chest x-ray to evaluate, low index of suspicion for pneumonia.    LOS: 6 days A FACE TO FACE EVALUATION WAS PERFORMED  [name] 06/29/2023, 8:27 AM

## 2023-06-29 NOTE — Progress Notes (Addendum)
Notified Dr.Engler awaiting x-ray results at this time. Notified oncoming nurse.     Tilden Dome, LPN

## 2023-06-29 NOTE — Progress Notes (Signed)
Physical Therapy Session Note  Patient Details  Name: Hunter Sparks MRN: [id number] Date of Birth: Apr 16, 1946  Today's Date: 06/29/2023 PT Individual Time: 1103-1203 PT Individual Time Calculation (min): 60 min   Short Term Goals: Week 1:  PT Short Term Goal 1 (Week 1): pt will maintain dynamic seated balance with min A PT Short Term Goal 2 (Week 1): Pt will perform bed to chair transfer with LRAD and min A PT Short Term Goal 3 (Week 1): pt will ambulate 50 feet with LRAD and CGA  Skilled Therapeutic Interventions/Progress Updates:    Pt presents in room in bed, attempting to pull covers up over self requires encouragement and increased time to motivate to participate with PT this session. Session focused on functional transfers and gait training with swedish walker to promote tolerance to upright and BLE coordination.  Pt completes bed mobility with supervision improved sequencing noted compared to previous session. Pt reports feelings increased fatigue and chills, MD present during session and information relayed. Pt completes squat pivot transfer mod assist to L with verbal cues for sequencing. Pt transported dependently in WC to main gym. Pt completes sit<>stands throughout session with min assist, mod verbal cues for hand placement to stand to RW  Pt completes gait training 2x72' with swedish walker and 2x40', min/mod assist, DF assist wrap applied to LLE to promote improved foot clearance with pt requiring max verbal cueing with fatigue for step through on LLE as well as upright posture with pt demonstrating crouched posturing with fatigue. Pt requires extended seated rest breaks between gait trials due to fatigue to promote improved energy conservation and quality with task.  Pt returns to room and remains seated in [facility] with all needs within reach, cal light in place and chair alarm donned and activated at end of session. Pt wife at bedside and updated on pt CLOF.  Therapy  Documentation Precautions:  Precautions Precautions: Fall Precaution Comments: L Hemi Restrictions Weight Bearing Restrictions: No    Therapy/Group: Individual Therapy  Edwin Cap PT, DPT 06/29/2023, 1:32 PM

## 2023-06-29 NOTE — Evaluation (Signed)
Speech Language Pathology Assessment and Plan  Hunter Sparks Details  Name: Hunter Sparks MRN: [id number] Date of Birth: [birth year]  SLP Diagnosis: Cognitive Impairments  ELOS: 8/30    Today's Date: 06/29/2023 SLP Individual Time: 1400-1500 SLP Individual Time Calculation (min): 60 min   Hospital Problem: Principal Problem:   Cerebral ischemic stroke due to global hypoperfusion with watershed infarct [facility]) Active Problems:   Stroke (cerebrum) (HCC)  Past Medical History:  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HCC)    Arthritis    "back, hips, legs" (06/10/2015)   Chest pain    Chronic lower back pain    Depression    DJD (degenerative joint disease)    Hyperlipidemia 06/11/2015   Hypertension    Hypothyroidism    Kidney stone    Kidney stone    Type II diabetes mellitus (HCC)    Past Surgical History:  Past Surgical History:  Procedure Laterality Date   CARDIAC CATHETERIZATION N/A 06/11/2015   Procedure: Left Heart Cath and Coronary Angiography;  Surgeon: Lyn Records, MD;  Location: [facility] INVASIVE CV LAB;  Service: Cardiovascular;  Laterality: N/A;   COLONOSCOPY  06/21/2011   Procedure: COLONOSCOPY;  Surgeon: Malissa Hippo, MD;  Location: [facility] SUITE;  Service: Endoscopy;  Laterality: N/A;   COLONOSCOPY     COLONOSCOPY N/A 09/20/2017   Procedure: COLONOSCOPY;  Surgeon: Malissa Hippo, MD;  Location: [facility];  Service: Endoscopy;  Laterality: N/A;  1200   CORONARY ARTERY BYPASS GRAFT N/A 06/14/2015   Procedure: CORONARY ARTERY BYPASS GRAFTING times four using Left Internal mammary artery and right leg Saphenous vein graft;  Surgeon: Kerin Perna, MD;  Location: [facility] OR;  Service: Open Heart Surgery;  Laterality: N/A;   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   HAND SURGERY     SKIN CANCER EXCISION Left    "forearm"   TEE WITHOUT CARDIOVERSION N/A 06/14/2015   Procedure: TRANSESOPHAGEAL ECHOCARDIOGRAM (TEE);  Surgeon: Kerin Perna, MD;   Location: [facility] OR;  Service: Open Heart Surgery;  Laterality: N/A;    Assessment / Plan / Recommendation Clinical Impression Hunter Sparks evaluated via the Cognistat standardized assessment to evaluate cognitive linguistic functioning per PT/OT request. Hunter Sparks with [facility] orientation, comprehension, naming, and judgement abilities. Mild impairments present in registration, repetition, calculations and similarities subtest's indicative of problem solving and memory deficits. Moderate impairments in attention tasks characterized by difficulties repeating serial numbers. Severe deficits in memory task exhibited by inability to recall 0/4 single vocabulary words after a timed delay despite cues. Pt would benefit from skilled ST intervention focused on cognitive goals, however family reports this is baseline function and decline ST services. Family educated on SLP's role and treatment activities that may be beneficial for patients cognitive functioning skills, however Hunter Sparks continued to decline stating they would like to only participate PT/OT sessions. Education complete. SLP to sign off. Please re-consult SLP if Hunter Sparks would like to engage in services.     Skilled Therapeutic Interventions          Hunter Sparks evaluated using a standardized cognitive linguistic assessment to assess current cognitive, communicative and swallowing function. See above for details.   SLP Assessment  Hunter Sparks does not need any further Speech Lanaguage Pathology Services    Recommendations  Hunter Sparks destination: Home Equipment Recommended: None recommended by SLP    Pain No pain reported this session  SLP Evaluation Cognition Overall Cognitive Status:  (Hunter Sparks reports b/l function - though cognitive deficits  noted) Arousal/Alertness: Awake/alert Orientation Level: Oriented X4 Year: 2024 Month: August Day of Week: Correct Attention: Sustained Sustained Attention: Appears intact Memory: Impaired Memory Impairment:  Decreased short term memory;Retrieval deficit;Storage deficit Decreased Short Term Memory: Verbal basic;Functional basic Awareness: Appears intact Problem Solving: Impaired Problem Solving Impairment: Functional basic;Verbal basic Safety/Judgment: Impaired  Comprehension Auditory Comprehension Overall Auditory Comprehension: Appears within functional limits for tasks assessed Yes/No Questions: Within Functional Limits Commands: Within Functional Limits Expression Expression Primary Mode of Expression: Verbal Verbal Expression Overall Verbal Expression: Appears within functional limits for tasks assessed Oral Motor Oral Motor/Sensory Function Overall Oral Motor/Sensory Function: Within functional limits Motor Speech Overall Motor Speech: Appears within functional limits for tasks assessed  Care Tool Care Tool Cognition Ability to hear (with hearing aid or hearing appliances if normally used Ability to hear (with hearing aid or hearing appliances if normally used): 0. Adequate - no difficulty in normal conservation, social interaction, listening to TV   Expression of Ideas and Wants Expression of Ideas and Wants: 4. Without difficulty (complex and basic) - expresses complex messages without difficulty and with speech that is clear and easy to understand   Understanding Verbal and Non-Verbal Content Understanding Verbal and Non-Verbal Content: 3. Usually understands - understands most conversations, but misses some part/intent of message. Requires cues at times to understand  Memory/Recall Ability      Recommendations for other services: None   Discharge Criteria: Hunter Sparks will be discharged from SLP if Hunter Sparks refuses treatment 3 consecutive times without medical reason, if treatment goals not met, if there is a change in medical status, if Hunter Sparks makes no progress towards goals or if Hunter Sparks is discharged from hospital.  The above assessment, treatment plan, treatment alternatives  and goals were discussed and mutually agreed upon: by Hunter Sparks and by family  Kapono Luhn M.A., CF-SLP 06/29/2023, 4:08 PM

## 2023-06-29 NOTE — Progress Notes (Signed)
Occupational Therapy Weekly Progress Note  Patient Details  Name: Hunter Sparks MRN: [id number] Date of Birth: 08-14-1946  Beginning of progress report period: June 24, 2023 End of progress report period: June 29, 2023  Today's Date: 06/29/2023 OT Individual Time: 8657-8469 OT Individual Time Calculation (min): 71 min    Patient has met 2 of 5 short term goals.  Pt is making slow but steady progress towards OT goals. He still requires mostly mod A for transfers and standing balance within BADL tasks due to pushing and lean to the L. Pt needs max cues and facilitation to come to midline. Sit<>stands have improved as well as LB dressing modifications. Continue current POC.   Patient continues to demonstrate the following deficits: muscle weakness, ataxia, decreased coordination, and decreased motor planning, decreased attention to left, and decreased sitting balance, decreased standing balance, decreased postural control, hemiplegia, and decreased balance strategies and therefore will continue to benefit from skilled OT intervention to enhance overall performance with BADL and Reduce care partner burden.  Patient progressing toward long term goals..  Continue plan of care.  OT Short Term Goals Week 1:  OT Short Term Goal 1 (Week 1): The pt will complete LB dressing with MinA  @ 95% safe incorporating AE as needed OT Short Term Goal 1 - Progress (Week 1): Progressing toward goal OT Short Term Goal 2 (Week 1): The patient will complete tub/shower unit transfer with MinA @ 95% safe using the grab bars for addiitonal balance. OT Short Term Goal 2 - Progress (Week 1): Progressing toward goal OT Short Term Goal 3 (Week 1): The pt will complete sit to stand using the RW with MinA at 95% safe. OT Short Term Goal 3 - Progress (Week 1): Met OT Short Term Goal 4 (Week 1): The pt will be demonstrate the use of AE, for LB task performance after initial demonstrate and v/c's with effective carryover  at 95% safe. OT Short Term Goal 4 - Progress (Week 1): Progressing toward goal OT Short Term Goal 5 (Week 1): The pt will improve UB strength to 5/5 MMT for safe and Ind functional with BADL related task at 95% safe. OT Short Term Goal 5 - Progress (Week 1): Met Week 2:  OT Short Term Goal 1 (Week 2): The pt will complete LB dressing with MinA @ 95% safe incorporating AE as needed OT Short Term Goal 2 (Week 2): The patient will complete tub/shower unit transfer with MinA @ 95% safe using the grab bars for addiitonal balance. OT Short Term Goal 3 (Week 2): Pt will maintain dynamic standing balance with min A for 2 minutes in preparation for BADL tasks  Skilled Therapeutic Interventions/Progress Updates:    Pt greeted semi-reclined in bed awake and agreeable to OT treatment session. Pt declined showering and stated "my wife will be here in a little while." OT educated pt that wife is not safe to assist pt with bathing tasks as he currently requires too much assistance and this would not be safe for pt or spouse. Pt verbalized understanding, but still refused any BADL tasks except for changing shirt. Pt completed bed mobility with increased time, HOB elevated, and min A. Cues to scoot all the to the edge of the bed. Pt with frequent lateral LOB to the L while seated EOB to doff old shirt and don clean one. Pt needed intermittent min A to achieve sitting balance. Pt donned new shirt with increased time due to balance deficits and min  A. Mod A to don shoes. Pt needed mod A for stand-pivot to wc. Pt brought to therapy gym and addressed standing balance/endurance with standing activity of checkers placed on the R side of body to facilitate more weight shift R. Pt was able to power up into standing with min A, but then needed anywhere from min to mod A for standing balance, but tolerated standing for 10 minutes. OT placed 4 inch step under L foot to help push pt back to midline and weight shift onto R LE. Some  improved balance with step. UB there-ex with 3 sets of 10 chest press, bicep curl, and straight arm raise using 4 lb weighted bar. Pt returned to room and left seated in wc with alarm on, call bell in reach, and needs met.   Therapy Documentation Precautions:  Precautions Precautions: Fall Precaution Comments: L Hemi Restrictions Weight Bearing Restrictions: No Pain:  Pt reports chronic back pain. No number given. Rest and respositioned.   Therapy/Group: Individual Therapy  [name] 06/29/2023, 8:23 AM

## 2023-06-30 LAB — GLUCOSE, CAPILLARY
Glucose-Capillary: 97 mg/dL (ref 70–99)
Glucose-Capillary: 164 mg/dL — ABNORMAL HIGH (ref 70–99)
Glucose-Capillary: 213 mg/dL — ABNORMAL HIGH (ref 70–99)
Glucose-Capillary: 226 mg/dL — ABNORMAL HIGH (ref 70–99)

## 2023-06-30 NOTE — Progress Notes (Signed)
Occupational Therapy Session Note  Patient Details  Name: Hunter Sparks MRN: [id number] Date of Birth: 1946-04-18  Today's Date: 06/30/2023 OT Individual Time: 4098-1191 OT Individual Time Calculation (min): 43 min    Short Term Goals: Week 2:  OT Short Term Goal 1 (Week 2): The pt will complete LB dressing with MinA @ 95% safe incorporating AE as needed OT Short Term Goal 2 (Week 2): The patient will complete tub/shower unit transfer with MinA @ 95% safe using the grab bars for addiitonal balance. OT Short Term Goal 3 (Week 2): Pt will maintain dynamic standing balance with min A for 2 minutes in preparation for BADL tasks  Skilled Therapeutic Interventions/Progress Updates:  Pt greeted asleep in supine, pt agreeable to OT intervention. Session focus on BADL reeducation, functional mobility, dynamic standing balance and decreasing overall caregiver burden.      Pt completed supine>sit with CGA. Pt completed squat pivot to w/c with MINA and an additional squat pivot to [facility] over toilet using grab bars as needed. Pt with continent urine void needing CGA for 3/3 toileting tasks using lateral leans for clothing mgmt.   Pt completed squat pivot transfer to shower seat for bathing with pt completing bathing with overall CGA, mild L lean during bathing needing cues for midline orientation. Pt exited shower in same manner. Pt completed dressing from w/c with set- up assist for UB dressing, pt donned pants and underwear from w/c with CGA using bed rail to stand to pull pants to waist line. Pt completed sit>stands with MIN A at bed rail.                 Ended session with pt seated in w/c with pt shaving at sink, RN approved this task.   Therapy Documentation Precautions:  Precautions Precautions: Fall Precaution Comments: L Hemi Restrictions Weight Bearing Restrictions: No    Pain: no pain     Therapy/Group: Individual Therapy  Pollyann Glen [facility] 06/30/2023, 12:16 PM

## 2023-06-30 NOTE — Plan of Care (Signed)
  Problem: Consults Goal: RH STROKE PATIENT EDUCATION Description: See Patient Education module for education specifics  Outcome: Progressing Goal: Nutrition Consult-if indicated Outcome: Progressing Goal: Diabetes Guidelines if Diabetic/Glucose > 140 Description: If diabetic or lab glucose is > 140 mg/dl - Initiate Diabetes/Hyperglycemia Guidelines & Document Interventions  Outcome: Progressing   Problem: RH BOWEL ELIMINATION Goal: RH STG MANAGE BOWEL WITH ASSISTANCE Description: STG Manage Bowel with min Assistance. Outcome: Progressing Goal: RH STG MANAGE BOWEL W/MEDICATION W/ASSISTANCE Description: STG Manage Bowel with Medication with min Assistance. Outcome: Progressing   Problem: RH BLADDER ELIMINATION Goal: RH STG MANAGE BLADDER WITH ASSISTANCE Description: STG Manage Bladder With min Assistance Outcome: Progressing Goal: RH STG MANAGE BLADDER WITH MEDICATION WITH ASSISTANCE Description: STG Manage Bladder With Medication With min Assistance. Outcome: Progressing Goal: RH STG MANAGE BLADDER WITH EQUIPMENT WITH ASSISTANCE Description: STG Manage Bladder With Equipment With min Assistance Outcome: Progressing   Problem: RH SKIN INTEGRITY Goal: RH STG SKIN FREE OF INFECTION/BREAKDOWN Description: Skin will remain intact and free of infection/breakdown with min assist  Outcome: Progressing Goal: RH STG MAINTAIN SKIN INTEGRITY WITH ASSISTANCE Description: STG Maintain Skin Integrity With min Assistance. Outcome: Progressing   Problem: RH SAFETY Goal: RH STG ADHERE TO SAFETY PRECAUTIONS W/ASSISTANCE/DEVICE Description: STG Adhere to Safety Precautions With cueing Assistance/Device. Outcome: Progressing Goal: RH STG DECREASED RISK OF FALL WITH ASSISTANCE Description: STG Decreased Risk of Fall With cueing Assistance. Outcome: Progressing   Problem: RH COGNITION-NURSING Goal: RH STG ANTICIPATES NEEDS/CALLS FOR ASSIST W/ASSIST/CUES Description: STG Anticipates  Needs/Calls for Assist With cueing Assistance/Cues. Outcome: Progressing   Problem: RH PAIN MANAGEMENT Goal: RH STG PAIN MANAGED AT OR BELOW PT'S PAIN GOAL Description: Pain will be managed 4 out of 10 on pain scale with PRN medications min assist  Outcome: Progressing   Problem: RH KNOWLEDGE DEFICIT Goal: RH STG INCREASE KNOWLEDGE OF DIABETES Description: Patient/caregiver will be able to managed diabetes medications and diet modifications to improve A1C of 8.8 from nursing education and nursing handouts independently  Outcome: Progressing Goal: RH STG INCREASE KNOWLEDGE OF HYPERTENSION Description: Patient/caregiver will be able to manage blood pressure medications and diet/lifestyle modifications from nursing education and nursing handouts independently  Outcome: Progressing Goal: RH STG INCREASE KNOWLEGDE OF HYPERLIPIDEMIA Description: Patient/caregiver will be able to manage cholesterol medications and improve cholesterol levels from diet/lifestyle modifications from nursing handouts and nursing education independently  Outcome: Progressing Goal: RH STG INCREASE KNOWLEDGE OF STROKE PROPHYLAXIS Description: Patient/caregiver will be able to manage stroke medications (ASA/Plavix) from nursing education and nursing handouts independently  Outcome: Progressing

## 2023-06-30 NOTE — Progress Notes (Signed)
PROGRESS NOTE   Subjective/Complaints:  Pt feeling much better today, no longer nauseated or feeling poorly. Feels ok. Slept poorly but declines wanting anything for it. Denies pain. No BM in 3-4 days per pt, but states this is normal for him and doesn't want anything for it. Reports he's urinating fine. Denies any other complaints or concerns otherwise.     ROS: as per HPI.  Denies CP, SOB, abd pain, n/v/d.   Objective:   DG Abd 1 View  Result Date: 06/29/2023 CLINICAL DATA:  [id number] Abdominal discomfort [id number] EXAM: ABDOMEN - 1 VIEW COMPARISON:  06/25/2023 abdominal radiograph FINDINGS: Surgical clips overlie the medial high left abdomen and medial lower right abdomen and right groin. No dilated small bowel loops. Mild colonic gas and stool. No evidence of pneumatosis or pneumoperitoneum. Moderate lumbar spondylosis. No radiopaque nephrolithiasis. IMPRESSION: Nonobstructive bowel gas pattern. Electronically Signed   By: Delbert Phenix M.D.   On: 06/29/2023 19:26   DG Chest 2 View  Result Date: 06/29/2023 CLINICAL DATA:  Dyspnea EXAM: CHEST - 2 VIEW COMPARISON:  05/24/2016 chest radiograph. FINDINGS: Intact sternotomy wires. Stable cardiomediastinal silhouette with normal heart size. No pneumothorax. No pleural effusion. Mild eventration of the posterior left hemidiaphragm. No pulmonary edema. No acute consolidative airspace disease. IMPRESSION: No active cardiopulmonary disease. Mild eventration of the posterior left hemidiaphragm. Electronically Signed   By: Delbert Phenix M.D.   On: 06/29/2023 19:25   Recent Labs    06/29/23 0528  WBC 8.6  HGB 13.9  HCT 40.0  PLT 188    Recent Labs    06/29/23 0528  NA 137  K 3.7  CL 100  CO2 25  GLUCOSE 101*  BUN 16  CREATININE 1.30*  CALCIUM 9.0     Intake/Output Summary (Last 24 hours) at 06/30/2023 0940 Last data filed at 06/30/2023 0755 Gross per 24 hour  Intake 476 ml  Output  300 ml  Net 176 ml        Physical Exam: Vital Signs Blood pressure (!) 145/88, pulse 83, temperature 98.2 F (36.8 C), temperature source Oral, resp. rate 17, height 6' (1.829 m), weight 85.2 kg, SpO2 96%.   PE: Constitution: Appropriate appearance for age.  Awake, mildly irritable, no apparent distress. Resp: No respiratory distress. No accessory muscle usage. on RA and CTAB Cardio: Well perfused appearance. No peripheral edema.  Regular rate and rhythm. Abdomen: Nondistended. Nontender.  Bowel sounds present and normoactive this morning. Psych: Appropriate mood and affect, although a little irritable at first. Neuro: AAOx3 , requires some cueing for year but otherwise intact.  PRIOR EXAMS: Neurologic Exam:   Sensory exam: revealed normal sensation in all dermatomal regions in bilateral upper extremities and bilateral lower extremities Motor exam: Working with therapies, moving all 4 limbs antigravity and against resistance. RUE 5/5 LUE- 5-/5 throughout RLE- 5/5  LLE- 4+/5 in HF/KE/KF and 5-/5 in DF/PF  MSK: Bilateral hand Dupuytren's contractures   Assessment/Plan: 1. Functional deficits which require 3+ hours per day of interdisciplinary therapy in a comprehensive inpatient rehab setting. Physiatrist is providing close team supervision and 24 hour management of active medical problems listed below. Physiatrist and rehab team continue to  assess barriers to discharge/monitor patient progress toward functional and medical goals  Care Tool:  Bathing    Body parts bathed by patient: Right arm, Left arm, Chest, Abdomen, Front perineal area, Buttocks, Right upper leg, Face, Left upper leg, Right lower leg, Left lower leg         Bathing assist Assist Level: Contact Guard/Touching assist (CGA to MinA)     Upper Body Dressing/Undressing Upper body dressing   What is the patient wearing?: Pull over shirt    Upper body assist Assist Level: Set up assist    Lower Body  Dressing/Undressing Lower body dressing      What is the patient wearing?: Pants     Lower body assist Assist for lower body dressing: Contact Guard/Touching assist (CGA to MinA secondary to challenges with functional balance)     Toileting Toileting    Toileting assist Assist for toileting: Supervision/Verbal cueing (S without cues)     Transfers Chair/bed transfer  Transfers assist  Chair/bed transfer activity did not occur: Safety/medical concerns  Chair/bed transfer assist level: Maximal Assistance - Patient 25 - 49%     Locomotion Ambulation   Ambulation assist      Assist level: Minimal Assistance - Patient > 75% Assistive device: Parallel bars Max distance: 10'   Walk 10 feet activity   Assist     Assist level: Minimal Assistance - Patient > 75% Assistive device: Parallel bars   Walk 50 feet activity   Assist Walk 50 feet with 2 turns activity did not occur: Safety/medical concerns         Walk 150 feet activity   Assist Walk 150 feet activity did not occur: Safety/medical concerns         Walk 10 feet on uneven surface  activity   Assist Walk 10 feet on uneven surfaces activity did not occur: Safety/medical concerns         Wheelchair     Assist Is the patient using a wheelchair?: Yes Type of Wheelchair: Manual    Wheelchair assist level: Supervision/Verbal cueing Max wheelchair distance: 150'    Wheelchair 50 feet with 2 turns activity    Assist        Assist Level: Supervision/Verbal cueing   Wheelchair 150 feet activity     Assist      Assist Level: Supervision/Verbal cueing   Blood pressure (!) 145/88, pulse 83, temperature 98.2 F (36.8 C), temperature source Oral, resp. rate 17, height 6' (1.829 m), weight 85.2 kg, SpO2 96%.  Medical Problem List and Plan: 1. 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   2.  Antithrombotics: -DVT/anticoagulation:  Pharmaceutical: Lovenox 40mg  QD             -antiplatelet therapy: Plavix and ASA x 3 weeks then ASA alone 3. Pain Management: will add Tramadol 50 mg q6pm per pt request- was home dose- and tylenol prn 4. Mood/Behavior/Sleep: trazodone 100 mg at bedtime- is home dose             -antipsychotic agents:  n/a  -Melatonin 3mg  at bedtime PRN  -06/30/23 poor sleep last night but doesn't want anything additional  5. Neuropsych/cognition: This patient appears capable of making decisions on his own behalf.  -8-14: SLP consulted for ongoing cognitive deficits 6. Skin/Wound Care: routine skin care/prevention 7. Fluids/Electrolytes/Nutrition: monitor I/O and routine labs  -06/24/23 CMP unremarkable today other than stable Cr 1.38, monitor -8-14: Mild cognitive deficit, will get labs in a.m. to ensure no metabolic or infectious causes.  No apparent sources. -8/16: Poor PO intakes yesterday; feeling generally ill today.  As needed Zofran 4 mg every 8 hours.  8. CAD/HTN- had been on Metoprolol 25 mg twice daily and Cozaar milligrams daily at home, however being held due to permissive HTN- can restart when needed -So far within parameters of permissive hypertension, will start gradually with low-dose metoprolol 12.5 mg twice daily 8-16 and titrate as appropriate -06/30/23 BPs stable, monitor with recent med changes   Vitals:   06/26/23 2100 06/27/23 0502 06/27/23 1345 06/27/23 2057  BP: (!) 152/71 (!) 152/70 (!) 156/85 138/85   06/28/23 0506 06/28/23 1441 06/28/23 1748 06/28/23 1941  BP: (!) 141/78 (!) 140/74 (!) 147/82 (!) 174/91   06/29/23 0507 06/29/23 1553 06/29/23 1835 06/30/23 0502  BP: (!) 157/95 (!) 162/68 (!)  166/77 (!) 145/88    9. CKD3A- baseline Cr 1.2-1.3, admission labs 06/24/23 at baseline, monitor  -Cr baseline; monitor 10. HLD- con't atorvastatin 40 mg daily 11. DM- insulin dependent- on insulin at home- A1c 8.8- con't SSI and Semglee 42 units at bedtime  -06/24/23 CBGs variable, will monitor for trend  -8-12: CBGs generally under 200, monitor  -8-15: Increase Semglee to 45 units - improved  -06/30/23 CBGs mostly <200, monitor  CBG (last 3)  Recent Labs    06/29/23 1626 06/29/23 2110 06/30/23 0611  GLUCAP 203* 173* 97    12. Constipation- normal for pt to go q3-4 days at home  -06/24/23 still no BM x3-4d, will start miralax every day and colace 100mg  BID for now; also has senokot 1 tab QHS PRN; monitor -8-12: Small bowel movement yesterday.  Wife reports last adequate bowel movement last Wednesday.  DC Colace, Senokot S1 tab twice daily, MiraLAX twice daily, KUB today.  May be complicating urinary urgency. -8/13: KUB moderate stool burden. PRN sorbitol today, suppository if no results.  Questionable ideology of linear densities seen on KUB.  -8/14:  Sorbitol given without results -  increase sennakot S to 2 tabs BID --> large BM this AM -8-15: Feelings of generalized illness, nausea, and poor p.o.'s.  KUB today to assess stool burden, nursing informed to give as needed sorbitol and suppository if moderate to high. -06/30/23 feeling better this morning, no BM in 3-4d which is normal for him, declines wanting anything additional right now; KUB last night with mild gas and stool so no dulcolax/sorbitol given last night; monitor for now, if no BM by tomorrow may need to attempt  13. Hypothyroidism- con't synthroid daily.  14. Grade 1 diastolic dysfunction- con't to monitor weights. Will add Daily weights if needed Filed Weights   06/23/23 1230  Weight: 85.2 kg    15. Purewick/urgent incontinence- will stop usage today, pt will attempt urinal -8-12: Documented is mostly  incontinent, however did have continent urination this morning.  Initiate PVRs, timed voiding.  - continent, monitor; given significant stool burden will likely improve once BM  -No documented incontinence since bowel movement 8/14, monitor  -06/30/23 mostly continent, one time incontinent;  monitor  16.  Overnight desaturation 89%.  Given generalized feelings of illness today, will get chest x-ray to evaluate, low index of suspicion for pneumonia. -06/30/23 CXR without acute findings, no further desats, feeling better today, monitor for now    LOS: 7 days A FACE TO [facility] EVALUATION WAS PERFORMED  [address] 06/30/2023, 9:40 AM

## 2023-06-30 NOTE — Progress Notes (Signed)
PROGRESS NOTE   Subjective/Complaints: KUB without sig stool burden   Discussed increasing fluid intake but states he does not like the water and prefers his well water at home   ROS: as per HPI.    Objective:   DG Abd 1 View  Result Date: 06/29/2023 CLINICAL DATA:  [id number] Abdominal discomfort [id number] EXAM: ABDOMEN - 1 VIEW COMPARISON:  06/25/2023 abdominal radiograph FINDINGS: Surgical clips overlie the medial high left abdomen and medial lower right abdomen and right groin. No dilated small bowel loops. Mild colonic gas and stool. No evidence of pneumatosis or pneumoperitoneum. Moderate lumbar spondylosis. No radiopaque nephrolithiasis. IMPRESSION: Nonobstructive bowel gas pattern. Electronically Signed   By: Delbert Phenix M.D.   On: 06/29/2023 19:26   DG Chest 2 View  Result Date: 06/29/2023 CLINICAL DATA:  Dyspnea EXAM: CHEST - 2 VIEW COMPARISON:  05/24/2016 chest radiograph. FINDINGS: Intact sternotomy wires. Stable cardiomediastinal silhouette with normal heart size. No pneumothorax. No pleural effusion. Mild eventration of the posterior left hemidiaphragm. No pulmonary edema. No acute consolidative airspace disease. IMPRESSION: No active cardiopulmonary disease. Mild eventration of the posterior left hemidiaphragm. Electronically Signed   By: Delbert Phenix M.D.   On: 06/29/2023 19:25   Recent Labs    06/29/23 0528  WBC 8.6  HGB 13.9  HCT 40.0  PLT 188    Recent Labs    06/29/23 0528  NA 137  K 3.7  CL 100  CO2 25  GLUCOSE 101*  BUN 16  CREATININE 1.30*  CALCIUM 9.0     Intake/Output Summary (Last 24 hours) at 06/30/2023 0846 Last data filed at 06/30/2023 0755 Gross per 24 hour  Intake 476 ml  Output 300 ml  Net 176 ml        Physical Exam: Vital Signs Blood pressure (!) 145/88, pulse 83, temperature 98.2 F (36.8 C), temperature source Oral, resp. rate 17, height 6' (1.829 m), weight 85.2 kg, SpO2  96%.  General: No acute distress Mood and affect are appropriate Heart: Regular rate and rhythm no rubs murmurs or extra sounds Lungs: Clear to auscultation, breathing unlabored, no rales or wheezes Abdomen: Positive bowel sounds, soft nontender to palpation, nondistended   Neurologic Exam:   Sensory exam: revealed normal sensation in all dermatomal regions in bilateral upper extremities and bilateral lower extremities Motor exam: Working with therapies, moving all 4 limbs antigravity and against resistance. Reduced motor control LUE and LLE  RUE 5/5 LUE- 5-/5 throughout RLE- 5/5  LLE- 4+/5 in HF/KE/KF and 5-/5 in DF/PF  MSK: Bilateral hand Dupuytren's contractures   Assessment/Plan: 1. Functional deficits which require 3+ hours per day of interdisciplinary therapy in a comprehensive inpatient rehab setting. Physiatrist is providing close team supervision and 24 hour management of active medical problems listed below. Physiatrist and rehab team continue to assess barriers to discharge/monitor patient progress toward functional and medical goals  Care Tool:  Bathing    Body parts bathed by patient: Right arm, Left arm, Chest, Abdomen, Front perineal area, Buttocks, Right upper leg, Face, Left upper leg, Right lower leg, Left lower leg         Bathing assist Assist Level: Contact Guard/Touching assist (CGA  to MinA)     Upper Body Dressing/Undressing Upper body dressing   What is the patient wearing?: Pull over shirt    Upper body assist Assist Level: Set up assist    Lower Body Dressing/Undressing Lower body dressing      What is the patient wearing?: Pants     Lower body assist Assist for lower body dressing: Contact Guard/Touching assist (CGA to MinA secondary to challenges with functional balance)     Toileting Toileting    Toileting assist Assist for toileting: Supervision/Verbal cueing (S without cues)     Transfers Chair/bed transfer  Transfers  assist  Chair/bed transfer activity did not occur: Safety/medical concerns  Chair/bed transfer assist level: Maximal Assistance - Patient 25 - 49%     Locomotion Ambulation   Ambulation assist      Assist level: Minimal Assistance - Patient > 75% Assistive device: Parallel bars Max distance: 10'   Walk 10 feet activity   Assist     Assist level: Minimal Assistance - Patient > 75% Assistive device: Parallel bars   Walk 50 feet activity   Assist Walk 50 feet with 2 turns activity did not occur: Safety/medical concerns         Walk 150 feet activity   Assist Walk 150 feet activity did not occur: Safety/medical concerns         Walk 10 feet on uneven surface  activity   Assist Walk 10 feet on uneven surfaces activity did not occur: Safety/medical concerns         Wheelchair     Assist Is the patient using a wheelchair?: Yes Type of Wheelchair: Manual    Wheelchair assist level: Supervision/Verbal cueing Max wheelchair distance: 150'    Wheelchair 50 feet with 2 turns activity    Assist        Assist Level: Supervision/Verbal cueing   Wheelchair 150 feet activity     Assist      Assist Level: Supervision/Verbal cueing   Blood pressure (!) 145/88, pulse 83, temperature 98.2 F (36.8 C), temperature source Oral, resp. rate 17, height 6' (1.829 m), weight 85.2 kg, SpO2 96%.  Medical Problem List and Plan: 1. 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   2.  Antithrombotics: -DVT/anticoagulation:  Pharmaceutical: Lovenox 40mg  QD             -antiplatelet therapy: Plavix and ASA x 3 weeks then ASA  alone 3. Pain Management: will add Tramadol 50 mg q6pm per pt request- was home dose- and tylenol prn 4. Mood/Behavior/Sleep: trazodone 100 mg at bedtime- is home dose             -antipsychotic agents: n/a  -Melatonin 3mg  at bedtime PRN 5. Neuropsych/cognition: This patient appears capable of making decisions on his own behalf.  -8-14: SLP consulted for ongoing cognitive deficits 6. Skin/Wound Care: routine skin care/prevention 7. Fluids/Electrolytes/Nutrition: monitor I/O and routine labs  -06/24/23 CMP unremarkable today other than stable Cr 1.38, monitor  -8-14: Mild cognitive deficit, will get labs in a.m.  to ensure no metabolic or infectious causes.  No apparent sources.  8/16: Poor PO intakes yesterday; feeling generally ill today.  As needed Zofran 4 mg every 8 hours. FLuid intake <535ml recorded yesterday , BUN nl, will cont to enc po fluid recheck labs on Monday  Meal intake improved to 755 today     Latest Ref Rng & Units 06/29/2023    5:28 AM 06/24/2023    7:28 AM 06/21/2023    3:20 AM  BMP  Glucose 70 - 99 mg/dL 643  329  518   BUN 8 - 23 mg/dL 16  12  16    Creatinine 0.61 - 1.24 mg/dL 8.41  6.60  6.30   Sodium 135 - 145 mmol/L 137  140  141   Potassium 3.5 - 5.1 mmol/L 3.7  3.5  3.6   Chloride 98 - 111 mmol/L 100  103  103   CO2 22 - 32 mmol/L 25  26  28    Calcium 8.9 - 10.3 mg/dL 9.0  9.0  8.9      8. CAD/HTN- had been on Metoprolol 25 mg twice daily and Cozaar milligrams daily at home, however being held due to permissive HTN- can restart when needed  -So far within parameters of permissive hypertension, will start gradually with low-dose metoprolol 12.5 mg twice daily 8-16 and titrate as appropriate   Vitals:   06/26/23 2100 06/27/23 0502 06/27/23 1345 06/27/23 2057  BP: (!) 152/71 (!) 152/70 (!) 156/85 138/85   06/28/23 0506 06/28/23 1441 06/28/23 1748 06/28/23 1941  BP: (!) 141/78 (!) 140/74 (!) 147/82 (!) 174/91   06/29/23 0507 06/29/23 1553 06/29/23 1835 06/30/23  0502  BP: (!) 157/95 (!) 162/68 (!) 166/77 (!) 145/88  HR 83 this am 8/17, monitor on current dose of BB  9. CKD3A- baseline Cr 1.2-1.3, admission labs 06/24/23 at baseline, monitor  Cr baseline; monitor 10. HLD- con't atorvastatin 40 mg daily 11. DM- insulin dependent- on insulin at home- A1c 8.8- con't SSI and Semglee 42 units at bedtime  -06/24/23 CBGs variable, will monitor for trend  8-12: CBGs generally under 200, monitor  8-15: Increase Semglee to 45 units - improved  CBG (last 3)  Recent Labs    06/29/23 1626 06/29/23 2110 06/30/23 0611  GLUCAP 203* 173* 97    12. Constipation- normal for pt to go q3-4 days at home  -06/24/23 still no BM x3-4d, will start miralax every day and colace 100mg  BID for now; also has senokot 1 tab QHS PRN; monitor 8-12: Small bowel movement yesterday.  Wife reports last adequate bowel movement last Wednesday.  DC Colace, Senokot S1 tab twice daily, MiraLAX twice daily, KUB today.  May be complicating urinary urgency. 8/13: KUB moderate stool burden. PRN sorbitol today, suppository if no results.  Questionable ideology of linear densities seen on KUB.  8/14:  Sorbitol given without results -  increase sennakot S to 2 tabs BID --> large BM this AM 8-15: Feelings of generalized illness, nausea, and poor p.o.'s.  KUB today to assess stool burden, nursing informed to give as needed sorbitol and suppository if moderate to high.   13. Hypothyroidism- con't synthroid daily.  14. Grade 1 diastolic dysfunction- con't to monitor weights. Will add Daily weights if needed Filed Weights   06/23/23 1230  Weight: 85.2 kg    15. Purewick/urgent incontinence- will stop usage today, pt will attempt urinal  8-12: Documented is mostly incontinent, however did have continent urination this morning.  Initiate PVRs, timed voiding.   - continent, monitor; given significant stool burden will likely improve once BM  No documented incontinence since bowel movement  8/14, monitor  16.  Overnight desaturation 89%.  CXR no acute disease    LOS: 7 days A FACE TO FACE EVALUATION WAS PERFORMED  Erick Colace 06/30/2023, 8:46 AM

## 2023-07-01 LAB — GLUCOSE, CAPILLARY
Glucose-Capillary: 128 mg/dL — ABNORMAL HIGH (ref 70–99)
Glucose-Capillary: 214 mg/dL — ABNORMAL HIGH (ref 70–99)
Glucose-Capillary: 161 mg/dL — ABNORMAL HIGH (ref 70–99)
Glucose-Capillary: 210 mg/dL — ABNORMAL HIGH (ref 70–99)
Glucose-Capillary: 224 mg/dL — ABNORMAL HIGH (ref 70–99)

## 2023-07-01 MED ORDER — ALUM & MAG HYDROXIDE-SIMETH 200-200-20 MG/5ML PO SUSP
15.0000 mL | Freq: Four times a day (QID) | ORAL | Status: DC | PRN
  Administered 2023-07-01 – 2023-07-02 (×2): 15 mL via ORAL
  Filled 2023-07-01 (×2): qty 30

## 2023-07-01 MED ORDER — PROMETHAZINE HCL 25 MG PO TABS: 25.0000 mg | ORAL_TABLET | Freq: Four times a day (QID) | ORAL | Status: DC | PRN

## 2023-07-01 MED ORDER — SORBITOL 70 % SOLN
30.0000 mL | Freq: Once | Status: AC
  Administered 2023-07-01: 30 mL via ORAL
  Filled 2023-07-01: qty 30

## 2023-07-01 MED ORDER — PROMETHAZINE HCL 25 MG RE SUPP: 25.0000 mg | Freq: Four times a day (QID) | RECTAL | Status: DC | PRN

## 2023-07-01 MED ORDER — POLYETHYLENE GLYCOL 3350 17 G PO PACK
17.0000 g | PACK | Freq: Three times a day (TID) | ORAL | Status: DC
  Administered 2023-07-01 – 2023-07-02 (×2): 17 g via ORAL
  Filled 2023-07-01 (×2): qty 1

## 2023-07-01 NOTE — Progress Notes (Addendum)
PROGRESS NOTE   Subjective/Complaints:  Pt feeling good today. Feels ok. Slept well. Denies pain. No BM in 4-5 days per pt, willing to try something today, will give sorbitol. Reports he's urinating fine. Denies any other complaints or concerns otherwise.     ROS: as per HPI.  Denies CP, SOB, abd pain, n/v/d.   Objective:   DG Abd 1 View  Result Date: 06/29/2023 CLINICAL DATA:  [id number] Abdominal discomfort [id number] EXAM: ABDOMEN - 1 VIEW COMPARISON:  06/25/2023 abdominal radiograph FINDINGS: Surgical clips overlie the medial high left abdomen and medial lower right abdomen and right groin. No dilated small bowel loops. Mild colonic gas and stool. No evidence of pneumatosis or pneumoperitoneum. Moderate lumbar spondylosis. No radiopaque nephrolithiasis. IMPRESSION: Nonobstructive bowel gas pattern. Electronically Signed   By: Delbert Phenix M.D.   On: 06/29/2023 19:26   DG Chest 2 View  Result Date: 06/29/2023 CLINICAL DATA:  Dyspnea EXAM: CHEST - 2 VIEW COMPARISON:  05/24/2016 chest radiograph. FINDINGS: Intact sternotomy wires. Stable cardiomediastinal silhouette with normal heart size. No pneumothorax. No pleural effusion. Mild eventration of the posterior left hemidiaphragm. No pulmonary edema. No acute consolidative airspace disease. IMPRESSION: No active cardiopulmonary disease. Mild eventration of the posterior left hemidiaphragm. Electronically Signed   By: Delbert Phenix M.D.   On: 06/29/2023 19:25   Recent Labs    06/29/23 0528  WBC 8.6  HGB 13.9  HCT 40.0  PLT 188    Recent Labs    06/29/23 0528  NA 137  K 3.7  CL 100  CO2 25  GLUCOSE 101*  BUN 16  CREATININE 1.30*  CALCIUM 9.0     Intake/Output Summary (Last 24 hours) at 07/01/2023 0949 Last data filed at 07/01/2023 9562 Gross per 24 hour  Intake 476 ml  Output 500 ml  Net -24 ml        Physical Exam: Vital Signs Blood pressure 132/64, pulse 73,  temperature 97.9 F (36.6 C), resp. rate 18, height 6' (1.829 m), weight 85.2 kg, SpO2 96%.   PE: Constitution: Appropriate appearance for age.  Awake, mildly irritable, no apparent distress. Laying in bed Resp: No respiratory distress. No accessory muscle usage. on RA and CTAB Cardio: Well perfused appearance. No peripheral edema.  Regular rate and rhythm. Abdomen: Nondistended. Nontender.  Bowel sounds present and slightly hypoactive today. Psych: Appropriate mood and affect, although a little irritable at first.   PRIOR EXAMS: Neurologic Exam:   Neuro: AAOx3 , requires some cueing for year but otherwise intact. Sensory exam: revealed normal sensation in all dermatomal regions in bilateral upper extremities and bilateral lower extremities Motor exam: Working with therapies, moving all 4 limbs antigravity and against resistance. RUE 5/5 LUE- 5-/5 throughout RLE- 5/5  LLE- 4+/5 in HF/KE/KF and 5-/5 in DF/PF  MSK: Bilateral hand Dupuytren's contractures   Assessment/Plan: 1. Functional deficits which require 3+ hours per day of interdisciplinary therapy in a comprehensive inpatient rehab setting. Physiatrist is providing close team supervision and 24 hour management of active medical problems listed below. Physiatrist and rehab team continue to assess barriers to discharge/monitor patient progress toward functional and medical goals  Care Tool:  Bathing  Body parts bathed by patient: Right arm, Left arm, Chest, Abdomen, Front perineal area, Buttocks, Right upper leg, Face, Left upper leg, Right lower leg, Left lower leg         Bathing assist Assist Level: Contact Guard/Touching assist     Upper Body Dressing/Undressing Upper body dressing   What is the patient wearing?: Button up shirt    Upper body assist Assist Level: Set up assist    Lower Body Dressing/Undressing Lower body dressing      What is the patient wearing?: Pants, Underwear/pull up     Lower  body assist Assist for lower body dressing: Contact Guard/Touching assist     Toileting Toileting    Toileting assist Assist for toileting: Contact Guard/Touching assist     Transfers Chair/bed transfer  Transfers assist  Chair/bed transfer activity did not occur: Safety/medical concerns  Chair/bed transfer assist level: Minimal Assistance - Patient > 75% (squat pivot)     Locomotion Ambulation   Ambulation assist      Assist level: Minimal Assistance - Patient > 75% Assistive device: Parallel bars Max distance: 10'   Walk 10 feet activity   Assist     Assist level: Minimal Assistance - Patient > 75% Assistive device: Parallel bars   Walk 50 feet activity   Assist Walk 50 feet with 2 turns activity did not occur: Safety/medical concerns         Walk 150 feet activity   Assist Walk 150 feet activity did not occur: Safety/medical concerns         Walk 10 feet on uneven surface  activity   Assist Walk 10 feet on uneven surfaces activity did not occur: Safety/medical concerns         Wheelchair     Assist Is the patient using a wheelchair?: Yes Type of Wheelchair: Manual    Wheelchair assist level: Supervision/Verbal cueing Max wheelchair distance: 150'    Wheelchair 50 feet with 2 turns activity    Assist        Assist Level: Supervision/Verbal cueing   Wheelchair 150 feet activity     Assist      Assist Level: Supervision/Verbal cueing   Blood pressure 132/64, pulse 73, temperature 97.9 F (36.6 C), resp. rate 18, height 6' (1.829 m), weight 85.2 kg, SpO2 96%.  Medical Problem List and Plan: 1. 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   2.  Antithrombotics: -DVT/anticoagulation:  Pharmaceutical: Lovenox 40mg  QD             -antiplatelet therapy: Plavix and ASA x 3 weeks then ASA alone 3. Pain Management: will add Tramadol 50 mg q6pm per pt request- was home dose- and tylenol prn 4. Mood/Behavior/Sleep: trazodone 100 mg at bedtime- is home dose             -antipsychotic agents: n/a  -Melatonin 3mg  at bedtime PRN  -06/30/23 poor sleep last night but doesn't want anything additional  5. Neuropsych/cognition: This patient appears capable of making decisions on his own behalf.  -8-14:  SLP consulted for ongoing cognitive deficits 6. Skin/Wound Care: routine skin care/prevention 7. Fluids/Electrolytes/Nutrition: monitor I/O and routine labs  -06/24/23 CMP unremarkable today other than stable Cr 1.38, monitor -8-14: Mild cognitive deficit, will get labs in a.m. to ensure no metabolic or infectious causes.  No apparent sources. -8/16: Poor PO intakes yesterday; feeling generally ill today.  As needed Zofran 4 mg every 8 hours.  8. CAD/HTN- had been on Metoprolol 25 mg twice daily and Cozaar milligrams daily at home, however being held due to permissive HTN- can restart when needed -So far within parameters of permissive hypertension, will start gradually with low-dose metoprolol 12.5 mg twice daily 8-16 and titrate as appropriate -06/30/23 BPs stable, monitor with recent med changes -07/01/23 BPs variable, monitor trend for now   Vitals:   06/27/23 2057 06/28/23 0506 06/28/23 1441 06/28/23 1748  BP: 138/85 (!) 141/78 (!) 140/74 (!) 147/82   06/28/23 1941 06/29/23 0507 06/29/23 1553 06/29/23 1835  BP: (!) 174/91 (!) 157/95 (!) 162/68 (!) 166/77   06/30/23 0502 06/30/23 1417 06/30/23 1840 07/01/23 0438  BP: (!) 145/88 (!) 119/54 (!) 149/69 132/64    9. CKD3A- baseline Cr 1.2-1.3, admission labs 06/24/23 at baseline, monitor  -Cr baseline; monitor 10. HLD- con't  atorvastatin 40 mg daily 11. DM- insulin dependent- on insulin at home- A1c 8.8- con't SSI and Semglee 42 units at bedtime  -06/24/23 CBGs variable, will monitor for trend  -8-12: CBGs generally under 200, monitor  -8-15: Increase Semglee to 45 units - improved  -06/30/23 CBGs mostly <200, monitor -07/01/23 CBGs a bit higher in the last 24h, but just marginally; monitor trend before making changes given recent med change  CBG (last 3)  Recent Labs    06/30/23 2143 07/01/23 0608 07/01/23 0904  GLUCAP 226* 128* 214*    12. Constipation- normal for pt to go q3-4 days at home  -06/24/23 still no BM x3-4d, will start miralax every day and colace 100mg  BID for now; also has senokot 1 tab QHS PRN; monitor -8-12: Small bowel movement yesterday.  Wife reports last adequate bowel movement last Wednesday.  DC Colace, Senokot S1 tab twice daily, MiraLAX twice daily, KUB today.  May be complicating urinary urgency. -8/13: KUB moderate stool burden. PRN sorbitol today, suppository if no results.  Questionable ideology of linear densities seen on KUB.  -8/14:  Sorbitol given without results -  increase sennakot S to 2 tabs BID --> large BM this AM -8-15: Feelings of generalized illness, nausea, and poor p.o.'s.  KUB today to assess stool burden, nursing informed to give as needed sorbitol and suppository if moderate to high. -06/30/23 feeling better this morning, no BM in 3-4d which is normal for him, declines wanting anything additional right now; KUB last night with mild gas and stool so no dulcolax/sorbitol given last night; monitor for now, if no BM by tomorrow may need to attempt -07/01/23 no BM still, will give sorbitol once now, increased miralax to TID for now, pt agreeable-- hopefully once moving bowels more regularly, may be able to cut down on miralax. Could also consider psyllium as an alternative.   13. Hypothyroidism- con't synthroid daily.  14. Grade 1 diastolic dysfunction- con't to  monitor weights. Will add Daily weights if needed Filed Weights   06/23/23 1230  Weight: 85.2 kg    15. Purewick/urgent incontinence- will stop usage today, pt will attempt urinal -8-12: Documented is mostly incontinent, however did have continent urination this morning.  Initiate  PVRs, timed voiding.  - continent, monitor; given significant stool burden will likely improve once BM  -No documented incontinence since bowel movement 8/14, monitor  -06/30/23 mostly continent, one time incontinent; monitor  16.  Overnight desaturation 89%.  Given generalized feelings of illness today, will get chest x-ray to evaluate, low index of suspicion for pneumonia. -06/30/23 CXR without acute findings, no further desats, feeling better today, monitor for now    LOS: 8 days A FACE TO [facility] EVALUATION WAS PERFORMED  [address] 07/01/2023, 9:49 AM

## 2023-07-02 ENCOUNTER — Inpatient Hospital Stay (HOSPITAL_COMMUNITY): Payer: HMO

## 2023-07-02 ENCOUNTER — Inpatient Hospital Stay (HOSPITAL_COMMUNITY): Payer: PPO

## 2023-07-02 ENCOUNTER — Other Ambulatory Visit: Payer: Self-pay | Admitting: Internal Medicine

## 2023-07-02 ENCOUNTER — Encounter (HOSPITAL_COMMUNITY): Payer: Self-pay

## 2023-07-02 ENCOUNTER — Inpatient Hospital Stay (HOSPITAL_COMMUNITY)
Admission: AD | Admit: 2023-07-02 | Discharge: 2023-07-14 | DRG: 871 | Disposition: A | Discharge status: Rehabilitation Facility | Payer: PPO | Source: Ambulatory Visit | Attending: Internal Medicine | Admitting: Internal Medicine

## 2023-07-02 DIAGNOSIS — R627 Adult failure to thrive: Secondary | ICD-10-CM | POA: Diagnosis not present

## 2023-07-02 DIAGNOSIS — Z8249 Family history of ischemic heart disease and other diseases of the circulatory system: Secondary | ICD-10-CM

## 2023-07-02 DIAGNOSIS — R6520 Severe sepsis without septic shock: Secondary | ICD-10-CM | POA: Diagnosis present

## 2023-07-02 DIAGNOSIS — I129 Hypertensive chronic kidney disease with stage 1 through stage 4 chronic kidney disease, or unspecified chronic kidney disease: Secondary | ICD-10-CM | POA: Diagnosis present

## 2023-07-02 DIAGNOSIS — R109 Unspecified abdominal pain: Secondary | ICD-10-CM | POA: Diagnosis not present

## 2023-07-02 DIAGNOSIS — E1122 Type 2 diabetes mellitus with diabetic chronic kidney disease: Secondary | ICD-10-CM | POA: Diagnosis present

## 2023-07-02 DIAGNOSIS — Z515 Encounter for palliative care: Secondary | ICD-10-CM

## 2023-07-02 DIAGNOSIS — E1165 Type 2 diabetes mellitus with hyperglycemia: Secondary | ICD-10-CM | POA: Diagnosis present

## 2023-07-02 DIAGNOSIS — T402X5A Adverse effect of other opioids, initial encounter: Secondary | ICD-10-CM | POA: Diagnosis not present

## 2023-07-02 DIAGNOSIS — M160 Bilateral primary osteoarthritis of hip: Secondary | ICD-10-CM | POA: Diagnosis present

## 2023-07-02 DIAGNOSIS — I10 Essential (primary) hypertension: Secondary | ICD-10-CM | POA: Diagnosis not present

## 2023-07-02 DIAGNOSIS — E785 Hyperlipidemia, unspecified: Secondary | ICD-10-CM | POA: Diagnosis present

## 2023-07-02 DIAGNOSIS — Z79890 Hormone replacement therapy (postmenopausal): Secondary | ICD-10-CM

## 2023-07-02 DIAGNOSIS — K8000 Calculus of gallbladder with acute cholecystitis without obstruction: Secondary | ICD-10-CM | POA: Diagnosis present

## 2023-07-02 DIAGNOSIS — Z823 Family history of stroke: Secondary | ICD-10-CM

## 2023-07-02 DIAGNOSIS — Z87442 Personal history of urinary calculi: Secondary | ICD-10-CM

## 2023-07-02 DIAGNOSIS — E43 Unspecified severe protein-calorie malnutrition: Secondary | ICD-10-CM | POA: Insufficient documentation

## 2023-07-02 DIAGNOSIS — Z6827 Body mass index (BMI) 27.0-27.9, adult: Secondary | ICD-10-CM

## 2023-07-02 DIAGNOSIS — G8929 Other chronic pain: Secondary | ICD-10-CM | POA: Diagnosis present

## 2023-07-02 DIAGNOSIS — N1831 Chronic kidney disease, stage 3a: Secondary | ICD-10-CM | POA: Diagnosis not present

## 2023-07-02 DIAGNOSIS — R4189 Other symptoms and signs involving cognitive functions and awareness: Secondary | ICD-10-CM | POA: Diagnosis not present

## 2023-07-02 DIAGNOSIS — K819 Cholecystitis, unspecified: Principal | ICD-10-CM

## 2023-07-02 DIAGNOSIS — E876 Hypokalemia: Secondary | ICD-10-CM | POA: Diagnosis not present

## 2023-07-02 DIAGNOSIS — B961 Klebsiella pneumoniae [K. pneumoniae] as the cause of diseases classified elsewhere: Secondary | ICD-10-CM | POA: Diagnosis present

## 2023-07-02 DIAGNOSIS — K760 Fatty (change of) liver, not elsewhere classified: Secondary | ICD-10-CM | POA: Diagnosis present

## 2023-07-02 DIAGNOSIS — I25110 Atherosclerotic heart disease of native coronary artery with unstable angina pectoris: Secondary | ICD-10-CM | POA: Diagnosis present

## 2023-07-02 DIAGNOSIS — M545 Low back pain, unspecified: Secondary | ICD-10-CM | POA: Diagnosis present

## 2023-07-02 DIAGNOSIS — I69354 Hemiplegia and hemiparesis following cerebral infarction affecting left non-dominant side: Secondary | ICD-10-CM

## 2023-07-02 DIAGNOSIS — Z79899 Other long term (current) drug therapy: Secondary | ICD-10-CM

## 2023-07-02 DIAGNOSIS — N179 Acute kidney failure, unspecified: Secondary | ICD-10-CM | POA: Diagnosis not present

## 2023-07-02 DIAGNOSIS — Z951 Presence of aortocoronary bypass graft: Secondary | ICD-10-CM

## 2023-07-02 DIAGNOSIS — Z7982 Long term (current) use of aspirin: Secondary | ICD-10-CM

## 2023-07-02 DIAGNOSIS — K567 Ileus, unspecified: Secondary | ICD-10-CM | POA: Diagnosis not present

## 2023-07-02 DIAGNOSIS — F32A Depression, unspecified: Secondary | ICD-10-CM | POA: Diagnosis present

## 2023-07-02 DIAGNOSIS — Z880 Allergy status to penicillin: Secondary | ICD-10-CM

## 2023-07-02 DIAGNOSIS — Z9889 Other specified postprocedural states: Secondary | ICD-10-CM

## 2023-07-02 DIAGNOSIS — E039 Hypothyroidism, unspecified: Secondary | ICD-10-CM | POA: Diagnosis present

## 2023-07-02 DIAGNOSIS — Y92239 Unspecified place in hospital as the place of occurrence of the external cause: Secondary | ICD-10-CM | POA: Diagnosis not present

## 2023-07-02 DIAGNOSIS — A419 Sepsis, unspecified organism: Secondary | ICD-10-CM | POA: Diagnosis not present

## 2023-07-02 DIAGNOSIS — G9341 Metabolic encephalopathy: Secondary | ICD-10-CM | POA: Diagnosis present

## 2023-07-02 DIAGNOSIS — Z809 Family history of malignant neoplasm, unspecified: Secondary | ICD-10-CM

## 2023-07-02 DIAGNOSIS — Z794 Long term (current) use of insulin: Secondary | ICD-10-CM | POA: Diagnosis not present

## 2023-07-02 DIAGNOSIS — Z800 Family history of malignant neoplasm of digestive organs: Secondary | ICD-10-CM

## 2023-07-02 DIAGNOSIS — Z85828 Personal history of other malignant neoplasm of skin: Secondary | ICD-10-CM

## 2023-07-02 DIAGNOSIS — B952 Enterococcus as the cause of diseases classified elsewhere: Secondary | ICD-10-CM | POA: Diagnosis present

## 2023-07-02 DIAGNOSIS — Z7902 Long term (current) use of antithrombotics/antiplatelets: Secondary | ICD-10-CM

## 2023-07-02 DIAGNOSIS — E119 Type 2 diabetes mellitus without complications: Secondary | ICD-10-CM | POA: Diagnosis not present

## 2023-07-02 DIAGNOSIS — Z66 Do not resuscitate: Secondary | ICD-10-CM | POA: Diagnosis not present

## 2023-07-02 DIAGNOSIS — M1909 Primary osteoarthritis, other specified site: Secondary | ICD-10-CM | POA: Diagnosis present

## 2023-07-02 DIAGNOSIS — I952 Hypotension due to drugs: Secondary | ICD-10-CM | POA: Diagnosis not present

## 2023-07-02 DIAGNOSIS — Z720 Tobacco use: Secondary | ICD-10-CM

## 2023-07-02 DIAGNOSIS — I639 Cerebral infarction, unspecified: Secondary | ICD-10-CM | POA: Diagnosis not present

## 2023-07-02 LAB — URINALYSIS, ROUTINE W REFLEX MICROSCOPIC
Bilirubin Urine: NEGATIVE
Glucose, UA: >=500 mg/dL — AB
Ketones, ur: 5 mg/dL — AB
Leukocytes,Ua: NEGATIVE
Nitrite: NEGATIVE
Protein, ur: >=300 mg/dL — AB
Specific Gravity, Urine: 1.021 (ref 1.005–1.030)
pH: 5 (ref 5.0–8.0)

## 2023-07-02 LAB — CBC WITH DIFFERENTIAL/PLATELET
Abs Immature Granulocytes: 0.07 10*3/uL (ref 0.00–0.07)
Basophils Absolute: 0.1 10*3/uL (ref 0.0–0.1)
Basophils Relative: 0 %
Eosinophils Absolute: 0.1 10*3/uL (ref 0.0–0.5)
Eosinophils Relative: 0 %
HCT: 46.2 % (ref 39.0–52.0)
Hemoglobin: 16.4 g/dL (ref 13.0–17.0)
Immature Granulocytes: 0 %
Lymphocytes Relative: 10 %
Lymphs Abs: 1.6 10*3/uL (ref 0.7–4.0)
MCH: 32.2 pg (ref 26.0–34.0)
MCHC: 35.5 g/dL (ref 30.0–36.0)
MCV: 90.6 fL (ref 80.0–100.0)
Monocytes Absolute: 1.6 10*3/uL — ABNORMAL HIGH (ref 0.1–1.0)
Monocytes Relative: 10 %
Neutro Abs: 12.7 10*3/uL — ABNORMAL HIGH (ref 1.7–7.7)
Neutrophils Relative %: 80 %
Platelets: 231 10*3/uL (ref 150–400)
RBC: 5.1 MIL/uL (ref 4.22–5.81)
RDW: 12 % (ref 11.5–15.5)
WBC: 16.1 10*3/uL — ABNORMAL HIGH (ref 4.0–10.5)
nRBC: 0 % (ref 0.0–0.2)

## 2023-07-02 LAB — COMPREHENSIVE METABOLIC PANEL
ALT: 22 U/L (ref 0–44)
AST: 20 U/L (ref 15–41)
Albumin: 3.7 g/dL (ref 3.5–5.0)
Alkaline Phosphatase: 87 U/L (ref 38–126)
Anion gap: 14 (ref 5–15)
BUN: 19 mg/dL (ref 8–23)
CO2: 25 mmol/L (ref 22–32)
Calcium: 9.4 mg/dL (ref 8.9–10.3)
Chloride: 94 mmol/L — ABNORMAL LOW (ref 98–111)
Creatinine, Ser: 1.27 mg/dL — ABNORMAL HIGH (ref 0.61–1.24)
GFR, Estimated: 58 mL/min — ABNORMAL LOW (ref >60)
Glucose, Bld: 244 mg/dL — ABNORMAL HIGH (ref 70–99)
Potassium: 4.2 mmol/L (ref 3.5–5.1)
Sodium: 133 mmol/L — ABNORMAL LOW (ref 135–145)
Total Bilirubin: 0.9 mg/dL (ref 0.3–1.2)
Total Protein: 7.6 g/dL (ref 6.5–8.1)

## 2023-07-02 LAB — CBC
HCT: 50.5 % (ref 39.0–52.0)
Hemoglobin: 17.5 g/dL — ABNORMAL HIGH (ref 13.0–17.0)
MCH: 31.9 pg (ref 26.0–34.0)
MCHC: 34.7 g/dL (ref 30.0–36.0)
MCV: 92 fL (ref 80.0–100.0)
Platelets: 219 10*3/uL (ref 150–400)
RBC: 5.49 MIL/uL (ref 4.22–5.81)
RDW: 12.2 % (ref 11.5–15.5)
WBC: 22.5 10*3/uL — ABNORMAL HIGH (ref 4.0–10.5)
nRBC: 0 % (ref 0.0–0.2)

## 2023-07-02 LAB — GLUCOSE, CAPILLARY
Glucose-Capillary: 248 mg/dL — ABNORMAL HIGH (ref 70–99)
Glucose-Capillary: 253 mg/dL — ABNORMAL HIGH (ref 70–99)
Glucose-Capillary: 213 mg/dL — ABNORMAL HIGH (ref 70–99)
Glucose-Capillary: 186 mg/dL — ABNORMAL HIGH (ref 70–99)
Glucose-Capillary: 234 mg/dL — ABNORMAL HIGH (ref 70–99)

## 2023-07-02 LAB — LACTIC ACID, PLASMA
Lactic Acid, Venous: 2.9 mmol/L (ref 0.5–1.9)
Lactic Acid, Venous: 1.6 mmol/L (ref 0.5–1.9)

## 2023-07-02 LAB — SODIUM, URINE, RANDOM: Sodium, Ur: 69 mmol/L

## 2023-07-02 LAB — BASIC METABOLIC PANEL
Anion gap: 13 (ref 5–15)
BUN: 21 mg/dL (ref 8–23)
CO2: 25 mmol/L (ref 22–32)
Calcium: 9.1 mg/dL (ref 8.9–10.3)
Chloride: 95 mmol/L — ABNORMAL LOW (ref 98–111)
Creatinine, Ser: 1.3 mg/dL — ABNORMAL HIGH (ref 0.61–1.24)
GFR, Estimated: 56 mL/min — ABNORMAL LOW (ref >60)
Glucose, Bld: 176 mg/dL — ABNORMAL HIGH (ref 70–99)
Potassium: 4.3 mmol/L (ref 3.5–5.1)
Sodium: 133 mmol/L — ABNORMAL LOW (ref 135–145)

## 2023-07-02 LAB — OSMOLALITY: Osmolality: 298 mOsm/kg — ABNORMAL HIGH (ref 275–295)

## 2023-07-02 LAB — TROPONIN I (HIGH SENSITIVITY)
Troponin I (High Sensitivity): 16 ng/L (ref <18)
Troponin I (High Sensitivity): 18 ng/L — ABNORMAL HIGH (ref <18)

## 2023-07-02 LAB — OSMOLALITY, URINE: Osmolality, Ur: 783 mOsm/kg (ref 300–900)

## 2023-07-02 MED ORDER — HYDRALAZINE HCL 25 MG PO TABS: 25.0000 mg | ORAL_TABLET | Freq: Four times a day (QID) | ORAL | Status: DC | PRN

## 2023-07-02 MED ORDER — SODIUM CHLORIDE 0.9 % IV SOLN: 2.0000 g | INTRAVENOUS | Status: DC

## 2023-07-02 MED ORDER — SODIUM CHLORIDE 0.9 % IV BOLUS
1000.0000 mL | Freq: Once | INTRAVENOUS | Status: AC
  Administered 2023-07-02: 1000 mL via INTRAVENOUS

## 2023-07-02 MED ORDER — LACTATED RINGERS IV SOLN
INTRAVENOUS | Status: DC
  Administered 2023-07-05 (×2): 1000 mL via INTRAVENOUS

## 2023-07-02 MED ORDER — LEVOFLOXACIN IN D5W 750 MG/150ML IV SOLN
750.0000 mg | INTRAVENOUS | Status: DC
  Administered 2023-07-02: 750 mg via INTRAVENOUS
  Filled 2023-07-02: qty 150

## 2023-07-02 MED ORDER — TRAMADOL HCL 50 MG PO TABS
50.0000 mg | ORAL_TABLET | Freq: Four times a day (QID) | ORAL | Status: DC | PRN
  Administered 2023-07-02: 50 mg via ORAL
  Filled 2023-07-02: qty 1

## 2023-07-02 MED ORDER — METRONIDAZOLE 500 MG/100ML IV SOLN
500.0000 mg | Freq: Two times a day (BID) | INTRAVENOUS | Status: DC
  Administered 2023-07-02: 500 mg via INTRAVENOUS
  Filled 2023-07-02 (×2): qty 100

## 2023-07-02 MED ORDER — HYDRALAZINE HCL 10 MG PO TABS
10.0000 mg | ORAL_TABLET | Freq: Once | ORAL | Status: AC
  Administered 2023-07-02: 10 mg via ORAL

## 2023-07-02 MED ORDER — SODIUM CHLORIDE 0.9 % IV SOLN: INTRAVENOUS | Status: DC

## 2023-07-02 MED ORDER — HYDRALAZINE HCL 10 MG PO TABS
10.0000 mg | ORAL_TABLET | Freq: Four times a day (QID) | ORAL | Status: DC | PRN
  Administered 2023-07-02: 10 mg via ORAL
  Filled 2023-07-02 (×2): qty 1

## 2023-07-02 MED ORDER — HYDROMORPHONE HCL 1 MG/ML IJ SOLN: 1.0000 mg | Freq: Once | INTRAMUSCULAR | Status: DC | PRN

## 2023-07-02 NOTE — Progress Notes (Signed)
2200 Patient c/o epigastric pain, administered night meds with a few sips of cola and shortly after pt became nauseated and vomited mostly liquid. Pt stated that every time he tries to eat or drink he vomits. To early to give prn antiemetics. On call provider call and orders were placed for phenergan. Once approved by pharmacy pt was asleep and appears to be resting.

## 2023-07-02 NOTE — H&P (Signed)
History and Physical    Hunter Sparks IEP:[id number] DOB: 29-May-1946 DOA: 07/02/2023  DOS: the patient was seen and examined on 07/02/2023  PCP: Benita Stabile, MD   Patient coming from:  inpatient rehab  I have personally briefly reviewed patient's old medical records in [facility]  CC: abd pain, leukocytosis HPI: 77 year old gentleman who was discharged from the hospital will on June 23, 2023 after spending 2 days in the hospital for an acute stroke.  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  He has had left hemiparesis since then.  He was accepted to inpatient rehab and discharged to there to undergo further therapy.  His wife [name] is at the bedside.  She states that he had no difficulty with speech.  His cognition was intact.  He had difficulty with left-sided weakness.  Over the course of the last 2 days, the patient is complaining of severe abdominal pain.  Initially was felt that it was due to constipation.  Patient developed a low-grade temp of 99.9 and a new oxygen requirement.  White count was 22.5.  Previously it was 8.6 on 06/29/2023.  Lactic acid was elevated 2.9.  Was given IV fluids with improvement in his lactic acid.  Decision made to readmit him to the hospital for further workup.  He was given a dose of IV Levaquin and Flagyl.  His wife states the patient's penicillin allergy is a rash but that was many years ago.  She thinks the patient can tolerate penicillin.  Right upper quadrant ultrasound was ordered but not yet performed.  Patient was transferred prior to right upper quadrant ultrasound being performed.  Patient still having pain.  Wife thinks the patient's mentation is worse.  Brain MRI performed today shows Interval evolution of the previously described right MCA infarct.  Patient was  agitated and required mittens.    Review of Systems:  Review of Systems  Unable to perform ROS: Mental acuity    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HCC)    Arthritis    "back, hips, legs" (06/10/2015)   Chest pain    Chronic lower back pain    Depression    DJD (degenerative joint disease)    Hyperlipidemia 06/11/2015   Hypertension    Hypothyroidism    Kidney stone    Kidney stone    Type II diabetes mellitus (HCC)     Past Surgical History:  Procedure Laterality Date   CARDIAC CATHETERIZATION N/A 06/11/2015   Procedure: Left Heart Cath and Coronary Angiography;  Surgeon: Lyn Records, MD;  Location: [facility] INVASIVE CV LAB;  Service: Cardiovascular;  Laterality: N/A;   COLONOSCOPY  06/21/2011   Procedure: COLONOSCOPY;  Surgeon: Malissa Hippo, MD;  Location: [facility];  Service: Endoscopy;  Laterality: N/A;   COLONOSCOPY     COLONOSCOPY N/A 09/20/2017   Procedure: COLONOSCOPY;  Surgeon: Malissa Hippo, MD;  Location: [facility];  Service: Endoscopy;  Laterality: N/A;  1200   CORONARY ARTERY BYPASS GRAFT N/A 06/14/2015   Procedure: CORONARY ARTERY BYPASS GRAFTING times four using Left Internal mammary artery and right leg Saphenous vein graft;  Surgeon: Kerin Perna, MD;  Location: [facility] OR;  Service: Open Heart Surgery;  Laterality: N/A;   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   HAND SURGERY  SKIN CANCER EXCISION Left    "forearm"   TEE WITHOUT CARDIOVERSION N/A 06/14/2015   Procedure: TRANSESOPHAGEAL ECHOCARDIOGRAM (TEE);  Surgeon: Kerin Perna, MD;  Location: [facility] OR;  Service: Open Heart Surgery;  Laterality: N/A;     reports that he quit smoking about 19 years ago. His smoking use included cigarettes. He started smoking about 64 years ago. He has a 90 pack-year smoking history. His smokeless tobacco use includes chew. He reports that he does not drink alcohol and does not use drugs.  Allergies  Allergen Reactions   Penicillins Rash          Family History  Problem Relation Age of Onset   Stroke Mother    Hypertension Mother    Stomach cancer Father    Liver cancer Father    Cancer Father    Rectal cancer Brother    Cancer Brother    Heart attack Neg Hx     Prior to Admission medications   Medication Sig Start Date End Date Taking? Authorizing Provider  acetaminophen (TYLENOL) 325 MG tablet Take 2 tablets (650 mg total) by mouth every 4 (four) hours as needed for mild pain (or temp > 37.5 C (99.5 F)). 06/23/23   Shon Hale, MD  aspirin EC 81 MG tablet Take 1 tablet (81 mg total) by mouth daily with breakfast. -Take Aspirin 81 mg daily along with Plavix 75 mg daily for 21 days then after that STOP the Plavix  and continue ONLY Aspirin 81 mg daily indefinitely-- 06/23/23   Shon Hale, MD  atorvastatin (LIPITOR) 40 MG tablet Take 1 tablet (40 mg total) by mouth daily. 06/24/23   Shon Hale, MD  blood glucose meter kit and supplies 1 each by Other route 4 (four) times daily. Dispense based on patient and insurance preference. Use up to four times daily as directed. (FOR ICD-10 E10.9, E11.9). 01/16/20   Roma Kayser, MD  clopidogrel (PLAVIX) 75 MG tablet Take 1 tablet (75 mg total) by mouth daily. Take Aspirin 81 mg daily along with Plavix 75 mg daily for 21 days then after that STOP the Plavix  and continue ONLY Aspirin 81 mg daily indefinitely-- 06/24/23   Shon Hale, MD  glucose blood (ONETOUCH VERIO) test strip 1 each by Other route 4 (four) times daily. Use as instructed 10/27/19   Roma Kayser, MD  insulin aspart (NOVOLOG) 100 UNIT/ML FlexPen Inject 0-10 Units into the skin 3 (three) times daily with meals. Short acting insulin per sliding scale 0-10 ---:-- Insulin injection 0-10 Units 0-10 Units Subcutaneous, 3 times daily with meals CBG 70 - 120: 0 unit CBG 121 - 150: 0 unit  CBG 151 - 200: 1 unit CBG 201 - 250: 2 units CBG 251 - 300: 4 units CBG 301 - 350: 6 units  CBG 351 - 400: 8  units  CBG > 400: 10 units 06/23/23   Emokpae, Courage, MD  insulin glargine, 2 Unit Dial, (TOUJEO MAX SOLOSTAR) 300 UNIT/ML Solostar Pen Inject 42 Units into the skin at bedtime. 06/23/23   Emokpae, Courage, MD  Insulin Pen Needle (B-D ULTRAFINE III SHORT PEN) 31G X 8 MM MISC 1 each by Does not apply route as directed. 10/27/19   Roma Kayser, MD  levothyroxine (SYNTHROID) 75 MCG tablet Take 1 tablet (75 mcg total) by mouth daily before breakfast. 06/23/23   Shon Hale, MD  losartan (COZAAR) 25 MG tablet Take 1 tablet (25 mg total) by mouth daily. 06/19/23  Antoine Poche, MD  metoprolol tartrate (LOPRESSOR) 25 MG tablet Take 1 tablet (25 mg total) by mouth 2 (two) times daily. 06/19/23   Antoine Poche, MD  senna-docusate (SENOKOT-S) 8.6-50 MG tablet Take 2 tablets by mouth at bedtime. 06/23/23   Shon Hale, MD  traMADol (ULTRAM) 50 MG tablet Take 1 tablet (50 mg total) by mouth every 6 (six) hours as needed. pain 06/19/15   Barrett, Erin R, PA-C  traZODone (DESYREL) 100 MG tablet Take 1 tablet (100 mg total) by mouth at bedtime. 06/23/23   Shon Hale, MD    Physical Exam: Vitals:   07/02/23 2229  BP: (!) 194/91  Pulse: (!) 127  Resp: 19  Temp: 98.5 F (36.9 C)  TempSrc: Axillary  SpO2: 91%    Physical Exam Vitals and nursing note reviewed.  Constitutional:      General: He is not in acute distress.    Appearance: He is not toxic-appearing.     Comments: agitated  HENT:     Head: Normocephalic and atraumatic.  Eyes:     General: No scleral icterus. Cardiovascular:     Rate and Rhythm: Regular rhythm. Tachycardia present.  Pulmonary:     Effort: No respiratory distress.  Abdominal:     Tenderness: There is generalized abdominal tenderness. There is no guarding or rebound. Negative signs include Murphy's sign.  Musculoskeletal:     Right lower leg: No edema.     Left lower leg: No edema.  Skin:    General: Skin is warm.     Capillary Refill:  Capillary refill takes less than 2 seconds.  Neurological:     Mental Status: He is disoriented.      Labs on Admission: I have personally reviewed following labs and imaging studies  CBC: Recent Labs  Lab 06/29/23 0528 07/02/23 0520 07/02/23 1619  WBC 8.6 16.1* 22.5*  NEUTROABS  --  12.7*  --   HGB 13.9 16.4 17.5*  HCT 40.0 46.2 50.5  MCV 88.7 90.6 92.0  PLT 188 231 219   Basic Metabolic Panel: Recent Labs  Lab 06/29/23 0528 07/02/23 0520 07/02/23 1619  NA 137 133* 133*  K 3.7 4.2 4.3  CL 100 94* 95*  CO2 25 25 25   GLUCOSE 101* 244* 176*  BUN 16 19 21   CREATININE 1.30* 1.27* 1.30*  CALCIUM 9.0 9.4 9.1   GFR: Estimated Creatinine Clearance: 51.4 mL/min (A) (by C-G formula based on SCr of 1.3 mg/dL (H)). Liver Function Tests: Recent Labs  Lab 07/02/23 0520  AST 20  ALT 22  ALKPHOS 87  BILITOT 0.9  PROT 7.6  ALBUMIN 3.7   Cardiac Enzymes: Recent Labs  Lab 07/02/23 1855 07/02/23 2030  TROPONINIHS 16 18*   CBG: Recent Labs  Lab 07/02/23 0408 07/02/23 0618 07/02/23 1132 07/02/23 1633 07/02/23 2127  GLUCAP 248* 253* 213* 186* 234*   Urine analysis:    Component Value Date/Time   COLORURINE AMBER (A) 07/02/2023 0818   APPEARANCEUR TURBID (A) 07/02/2023 0818   LABSPEC 1.021 07/02/2023 0818   PHURINE 5.0 07/02/2023 0818   GLUCOSEU >=500 (A) 07/02/2023 0818   HGBUR SMALL (A) 07/02/2023 0818   BILIRUBINUR NEGATIVE 07/02/2023 0818   KETONESUR 5 (A) 07/02/2023 0818   PROTEINUR >=300 (A) 07/02/2023 0818   UROBILINOGEN 1.0 06/13/2015 0945   NITRITE NEGATIVE 07/02/2023 0818   LEUKOCYTESUR NEGATIVE 07/02/2023 0818    Radiological Exams on Admission: I have personally reviewed images DG Chest 2 View  Result Date:  07/02/2023 CLINICAL DATA:  Leukocytosis. EXAM: CHEST - 2 VIEW COMPARISON:  None Available. FINDINGS: The heart size and mediastinal contours are within normal limits. Both lungs are clear. The visualized skeletal structures are  unremarkable. IMPRESSION: No active cardiopulmonary disease. Electronically Signed   By: Marin Roberts M.D.   On: 07/02/2023 18:06   MR BRAIN WO CONTRAST  Result Date: 07/02/2023 CLINICAL DATA:  Mental status change, unknown cause. EXAM: MRI HEAD WITHOUT CONTRAST TECHNIQUE: Multiplanar, multiecho pulse sequences of the brain and surrounding structures were obtained without intravenous contrast. COMPARISON:  Head MRI and MRA 06/21/2023 FINDINGS: The study is intermittently moderately motion degraded. Brain: Small acute to early subacute right cerebral hemispheric infarcts described on the prior MRI have evolved in the interim. Mildly restricted diffusion remains at the site of the posterior right basal ganglia infarct. No new infarct, mass, midline shift, or extra-axial fluid collection is identified. Patchy T2 hyperintensities in the cerebral white matter bilaterally are unchanged and nonspecific but compatible with moderate chronic small vessel ischemic disease. Chronic infarcts are again noted in the right basal ganglia, left corona radiata, pons, and both cerebellar hemispheres a chronic microhemorrhage in the right occipital lobe is unchanged. There is moderate cerebral atrophy. Vascular: Abnormal appearance of the distal left vertebral artery, more fully evaluated on the recent MRA. Skull and upper cervical spine: No suspicious marrow lesion. Sinuses/Orbits: Unremarkable orbits. Paranasal sinuses and mastoid air cells are clear. Other: None. IMPRESSION: 1. Interval evolution of the previously described recent infarcts. 2. No new infarct or other acute intracranial abnormality. 3. Moderate chronic small vessel ischemic disease with multiple chronic infarcts as above. Electronically Signed   By: Sebastian Ache M.D.   On: 07/02/2023 15:35    EKG: My personal interpretation of EKG shows: sinus tachycardia    Assessment/Plan Principal Problem:   Acute abdominal pain Active Problems:   Sepsis with  acute organ dysfunction without septic shock (HCC)   Hypothyroidism   Essential hypertension   Coronary artery disease involving native coronary artery of native heart with unstable angina pectoris (HCC)   Insulin dependent type 2 diabetes mellitus (HCC)   CKD stage 3a, GFR 45-59 ml/min (HCC) - Baseline scr 1.3-1.5   Hemiparesis affecting left side as late effect of cerebrovascular accident (CVA) (HCC)    Assessment and Plan: * Acute abdominal pain Admit to progressive bed. IV rocephin/flagyl. Check stat CT abd/pelvis. Start IVF. May need surgical consultation depending on CT results. Discussed with wife linda at bedside.  Sepsis with acute organ dysfunction without septic shock (HCC) Due to intra-abd process. Suspect acute cholecystitis. Evidenced by leukocytosis of 22.5, lactic acid of 2.9.  Hemiparesis affecting left side as late effect of cerebrovascular accident (CVA) (HCC) Pt was at inpatient rehab prior to this admission for acute abd pain. Should go back to inpatient rehab at discharge.  CKD stage 3a, GFR 45-59 ml/min (HCC) - Baseline scr 1.3-1.5 Baseline scr 1.3-1.5. stable  Insulin dependent type 2 diabetes mellitus (HCC) Continue with lantus and SSI.  Coronary artery disease involving native coronary artery of native heart with unstable angina pectoris (HCC) On asa, lipitor, lopressor.  Essential hypertension Continue lopressor, cozaar.  Hypothyroidism Stable. On synthroid.   DVT prophylaxis: SQ Heparin Code Status: Full Code. Discussed with wife Family Communication: discussed with wife at bedside  Disposition Plan: return to inpatient rehab  Consults called: none  Admission status: Observation, Telemetry bed   Carollee Herter, DO [facility] 07/02/2023, 11:24 PM

## 2023-07-02 NOTE — Progress Notes (Signed)
Occupational Therapy Note  Patient Details  Name: Hunter Sparks MRN: [id number] Date of Birth: Aug 13, 1946  Today's Date: 07/02/2023 OT Missed Time: 45 Minutes Missed Time Reason: MD hold (comment) (verbal order to hold therapies due to medical decline)  Pt greeted semi-reclined in bed with breakfast tray being taken out by nurse tech as pt unable to eat due to nausea. . Pt does not t look like he feels well. Pt stated he has no energy, nauseous, very fatigued, and says that he feels too weak to get up. He is refusing therapy this am and just wants to sleep. Pt also stated he has a burning pain in abdomen. Nursing and DO notified with verbal orders to hold therapy while medical workup being done.   [name] [name] 07/02/2023, 8:48 AM

## 2023-07-02 NOTE — Assessment & Plan Note (Addendum)
Baseline scr 1.3-1.5. stable

## 2023-07-02 NOTE — Assessment & Plan Note (Signed)
Admit to progressive bed. IV rocephin/flagyl. Check stat CT abd/pelvis. Start IVF. May need surgical consultation depending on CT results. Discussed with wife linda at bedside.

## 2023-07-02 NOTE — Assessment & Plan Note (Signed)
On asa, lipitor, lopressor.

## 2023-07-02 NOTE — Assessment & Plan Note (Signed)
Due to intra-abd process. Suspect acute cholecystitis. Evidenced by leukocytosis of 22.5, lactic acid of 2.9.

## 2023-07-02 NOTE — Assessment & Plan Note (Signed)
Stable. On synthroid.

## 2023-07-02 NOTE — Progress Notes (Signed)
Pt woke up moan and yelling in pain. C/o right upper quadrant stabbing non radiating abdominal pain.pt initially refused meds due to fear of vomiting. Pt agreed to take Zofran sublingual followed by Maalox. BP and heart rate elevated. Pt now resting and appears asleep. Will update on call provider and recheck vitals once pain in under control.

## 2023-07-02 NOTE — Progress Notes (Signed)
PROGRESS NOTE   Subjective/Complaints: RUQ pain this AM, WBC 16; no focal pain on exam, no TTP, geberalized nausea and encephalopathy compared to last exam.  Has had nausea and vomiting this a.m., unable to tolerate most p.o.'s. Increase tramadol to Q6H PRN for severe pain earlier this AM ; will DC now given cognitive changes Virtals HTN and tachycardia.  Last known normal per nursing unknown, started having incontinence last night at approximately 2200.     ROS: as per HPI.    Objective:   No results found. Recent Labs    07/02/23 0520  WBC 16.1*  HGB 16.4  HCT 46.2  PLT 231    Recent Labs    07/02/23 0520  NA 133*  K 4.2  CL 94*  CO2 25  GLUCOSE 244*  BUN 19  CREATININE 1.27*  CALCIUM 9.4     Intake/Output Summary (Last 24 hours) at 07/02/2023 0813 Last data filed at 07/01/2023 2148 Gross per 24 hour  Intake 472 ml  Output 200 ml  Net 272 ml        Physical Exam: Vital Signs Blood pressure (!) 150/80, pulse (!) 107, temperature 98.1 F (36.7 C), resp. rate 19, height 6' (1.829 m), weight 85.2 kg, SpO2 95%.  General: No acute distress.  Sitting up in bed Heart: Regular rate and rhythm no rubs murmurs or extra sounds Lungs: Clear to auscultation, breathing unlabored, no rales or wheezes Abdomen: Positive bowel sounds, nondistended.  Negative McBurney's point.  No rebound tenderness.  Moderate guarding with palpation of right lower quadrant, but patient denies any pain.   Neurologic Exam: Cranial nerves II through XII grossly intact Cognition awake, alert, and oriented to self and place; not oriented to time.  Moderate cognitive delay, different from prior exams. Sensory exam: revealed normal sensation in all dermatomal regions in bilateral upper extremities and bilateral lower extremities Motor exam:  Difficulty initiating movements in left lower extremity. RUE 5/5 LUE- 5-/5 throughout RLE- 5/5  in LLE- 4-/5 throughout  MSK: Bilateral hand Dupuytren's contractures   Assessment/Plan: 1. Functional deficits which require 3+ hours per day of interdisciplinary therapy in a comprehensive inpatient rehab setting. Physiatrist is providing close team supervision and 24 hour management of active medical problems listed below. Physiatrist and rehab team continue to assess barriers to discharge/monitor patient progress toward functional and medical goals  Care Tool:  Bathing    Body parts bathed by patient: Right arm, Left arm, Chest, Abdomen, Front perineal area, Buttocks, Right upper leg, Face, Left upper leg, Right lower leg, Left lower leg         Bathing assist Assist Level: Contact Guard/Touching assist     Upper Body Dressing/Undressing Upper body dressing   What is the patient wearing?: Button up shirt    Upper body assist Assist Level: Set up assist    Lower Body Dressing/Undressing Lower body dressing      What is the patient wearing?: Pants, Underwear/pull up     Lower body assist Assist for lower body dressing: Contact Guard/Touching assist     Toileting Toileting    Toileting assist Assist for toileting: Contact Guard/Touching assist     Transfers  Chair/bed transfer  Transfers assist  Chair/bed transfer activity did not occur: Safety/medical concerns  Chair/bed transfer assist level: Minimal Assistance - Patient > 75% (squat pivot)     Locomotion Ambulation   Ambulation assist      Assist level: Minimal Assistance - Patient > 75% Assistive device: Parallel bars Max distance: 10'   Walk 10 feet activity   Assist     Assist level: Minimal Assistance - Patient > 75% Assistive device: Parallel bars   Walk 50 feet activity   Assist Walk 50 feet with 2 turns activity did not occur: Safety/medical concerns         Walk 150 feet activity   Assist Walk 150 feet activity did not occur: Safety/medical concerns         Walk  10 feet on uneven surface  activity   Assist Walk 10 feet on uneven surfaces activity did not occur: Safety/medical concerns         Wheelchair     Assist Is the patient using a wheelchair?: Yes Type of Wheelchair: Manual    Wheelchair assist level: Supervision/Verbal cueing Max wheelchair distance: 150'    Wheelchair 50 feet with 2 turns activity    Assist        Assist Level: Supervision/Verbal cueing   Wheelchair 150 feet activity     Assist      Assist Level: Supervision/Verbal cueing   Blood pressure (!) 150/80, pulse (!) 107, temperature 98.1 F (36.7 C), resp. rate 19, height 6' (1.829 m), weight 85.2 kg, SpO2 95%.  Medical Problem List and Plan: 1. 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   8-19: Stat MRI brain today for new encephalopathy, nausea/vomiting, urinary incontinence.  Vitals changes and elevated WBC could be secondary to new stroke.  Will rule this out, additionally get UTI workup.  2.  Antithrombotics: -DVT/anticoagulation:  Pharmaceutical: Lovenox 40mg  QD             -antiplatelet therapy: Plavix and ASA x 3 weeks then ASA alone 3. Pain Management: will add Tramadol 50 mg q6pm per pt request- was home dose- and tylenol prn 4. Mood/Behavior/Sleep: trazodone 100 mg at bedtime- is home dose             -antipsychotic agents: n/a  -Melatonin 3mg  at bedtime PRN 5. Neuropsych/cognition: This patient appears capable of making decisions on his own behalf.  -8-14: SLP consulted for ongoing cognitive deficits 6. Skin/Wound Care: routine skin care/prevention 7. Fluids/Electrolytes/Nutrition: monitor I/O and  routine labs  -06/24/23 CMP unremarkable today other than stable Cr 1.38, monitor  -8-14: Mild cognitive deficit, will get labs in a.m. to ensure no metabolic or infectious causes.  No apparent sources.  8/16: Poor PO intakes yesterday; feeling generally ill today.  As needed Zofran 4 mg every 8 hours. FLuid intake <578ml recorded yesterday , BUN nl, will cont to enc po fluid recheck labs on Monday  Meal intake improved to 755 today  8/19: BUN/Cr unchanged, Na low, WBC 16 - urine studies for hyponatremia and  fluid restriction today.  Start IV fluids at 75 cc/h.  I     Latest Ref Rng & Units 07/02/2023    5:20 AM 06/29/2023    5:28 AM 06/24/2023    7:28 AM  BMP  Glucose 70 - 99 mg/dL 811  914  782   BUN 8 - 23 mg/dL 19  16  12    Creatinine 0.61 - 1.24 mg/dL 9.56  2.13  0.86   Sodium 135 - 145 mmol/L 133  137  140   Potassium 3.5 - 5.1 mmol/L 4.2  3.7  3.5   Chloride 98 - 111 mmol/L 94  100  103   CO2 22 - 32 mmol/L 25  25  26    Calcium 8.9 - 10.3 mg/dL 9.4  9.0  9.0      8. CAD/HTN- had been on Metoprolol 25 mg twice daily and Cozaar milligrams daily at home, however being held due to permissive HTN- can restart when needed  -So far within parameters of permissive hypertension, will start gradually with low-dose metoprolol 12.5 mg twice daily 8-16 and titrate as appropriate  8-19: Increased hypertension overnight, 198/98, without additional treatment.  MRI head pending as above.  IV fluids as above.  Adding as needed hydralazine 10 mg for SBP greater than 170.   Vitals:   06/29/23 0507 06/29/23 1553 06/29/23 1835 06/30/23 0502  BP: (!) 157/95 (!) 162/68 (!) 166/77 (!) 145/88   06/30/23 1417 06/30/23 1840 07/01/23 0438 07/01/23 1314  BP: (!) 119/54 (!) 149/69 132/64 (!) 147/68   07/01/23 1943 07/02/23 0349 07/02/23 0509 07/02/23 0700  BP: (!) 161/59 (!) 198/98 (!) 175/92 (!) 150/80    9. CKD3A- baseline Cr 1.2-1.3, admission labs 06/24/23 at baseline, monitor  Cr baseline; monitor 10.  HLD- con't atorvastatin 40 mg daily 11. DM- insulin dependent- on insulin at home- A1c 8.8- con't SSI and Semglee 42 units at bedtime  -06/24/23 CBGs variable, will monitor for trend  8-12: CBGs generally under 200, monitor  8-15: Increase Semglee to 45 units - improved  8-19: Hyperglycemic today, IV fluid as above and monitor with poor p.o./nausea.  CBG (last 3)  Recent Labs    07/01/23 2127 07/02/23 0408 07/02/23 0618  GLUCAP 224* 248* 253*    12. Constipation- normal for pt to go q3-4 days at home  -06/24/23 still no BM x3-4d, will start miralax every day and colace 100mg  BID for now; also has senokot 1 tab QHS PRN; monitor 8-12: Small bowel movement yesterday.  Wife reports last adequate bowel movement last Wednesday.  DC Colace, Senokot S1 tab twice daily, MiraLAX twice daily, KUB today.  May be complicating urinary urgency. 8/13: KUB moderate stool burden. PRN sorbitol today, suppository if no results.  Questionable ideology of linear densities seen on KUB.  8/14:  Sorbitol given without results -  increase sennakot S to 2 tabs BID --> large BM this AM 8-15: Feelings of generalized illness, nausea, and poor p.o.'s.  KUB today to assess stool burden, nursing informed to give as needed sorbitol and suppository if moderate to high. 8/19: BM overnight, endorsing right upper quadrant abdominal pain however nonfocal on exam.  Treatment as above   13. Hypothyroidism- con't synthroid daily.  14. Grade 1 diastolic dysfunction- con't to monitor weights. Will add Daily weights if needed Filed Weights   06/23/23 1230  Weight: 85.2 kg    15. Purewick/urgent incontinence- will stop usage today, pt will attempt urinal  8-12: Documented is mostly  incontinent, however did have continent urination this morning.  Initiate PVRs, timed voiding.   - continent, monitor; given significant stool burden will likely improve once BM  No documented incontinence since bowel movement 8/14,  monitor  8-19: New urinary incontinence overnight.  Urinalysis pending, MRI head as above.  If urinalysis positive, will start IV Rocephin while waiting for cultures  16.  Overnight desaturation 89%.  CXR no acute disease    LOS: 9 days A FACE TO FACE EVALUATION WAS PERFORMED  [name] 07/02/2023, 8:13 AM

## 2023-07-02 NOTE — Assessment & Plan Note (Signed)
Continue lopressor, cozaar.

## 2023-07-02 NOTE — Discharge Summary (Signed)
Physician Discharge Summary  Patient ID: Hunter Sparks MRN: [id number] DOB/AGE: 06-21-1946 77 y.o.  Admit date: 06/23/2023 Discharge date: 07/02/2023  Discharge Diagnoses:  Principal Problem:   Cerebral ischemic stroke due to global hypoperfusion with watershed infarct [facility]) Active Problems:   Hypothyroidism   Mixed hyperlipidemia   Essential hypertension   Coronary artery disease involving native coronary artery of native heart with unstable angina pectoris (HCC)   S/P CABG x 4   Insulin dependent type 2 diabetes mellitus (HCC)   CKD stage 3a, GFR 45-59 ml/min (HCC)   Stroke (cerebrum) (HCC)   Moderate cognitive impairment Leukocytosis Right upper quadrant discomfort  Discharged Condition: Guarded  Significant Diagnostic Studies: DG Chest 2 View  Result Date: 07/02/2023 CLINICAL DATA:  Leukocytosis. EXAM: CHEST - 2 VIEW COMPARISON:  None Available. FINDINGS: The heart size and mediastinal contours are within normal limits. Both lungs are clear. The visualized skeletal structures are unremarkable. IMPRESSION: No active cardiopulmonary disease. Electronically Signed   By: Marin Roberts M.D.   On: 07/02/2023 18:06   MR BRAIN WO CONTRAST  Result Date: 07/02/2023 CLINICAL DATA:  Mental status change, unknown cause. EXAM: MRI HEAD WITHOUT CONTRAST TECHNIQUE: Multiplanar, multiecho pulse sequences of the brain and surrounding structures were obtained without intravenous contrast. COMPARISON:  Head MRI and MRA 06/21/2023 FINDINGS: The study is intermittently moderately motion degraded. Brain: Small acute to early subacute right cerebral hemispheric infarcts described on the prior MRI have evolved in the interim. Mildly restricted diffusion remains at the site of the posterior right basal ganglia infarct. No new infarct, mass, midline shift, or extra-axial fluid collection is identified. Patchy T2 hyperintensities in the cerebral white matter bilaterally are unchanged and nonspecific  but compatible with moderate chronic small vessel ischemic disease. Chronic infarcts are again noted in the right basal ganglia, left corona radiata, pons, and both cerebellar hemispheres a chronic microhemorrhage in the right occipital lobe is unchanged. There is moderate cerebral atrophy. Vascular: Abnormal appearance of the distal left vertebral artery, more fully evaluated on the recent MRA. Skull and upper cervical spine: No suspicious marrow lesion. Sinuses/Orbits: Unremarkable orbits. Paranasal sinuses and mastoid air cells are clear. Other: None. IMPRESSION: 1. Interval evolution of the previously described recent infarcts. 2. No new infarct or other acute intracranial abnormality. 3. Moderate chronic small vessel ischemic disease with multiple chronic infarcts as above. Electronically Signed   By: Sebastian Ache M.D.   On: 07/02/2023 15:35   DG Abd 1 View  Result Date: 06/29/2023 CLINICAL DATA:  [id number] Abdominal discomfort [id number] EXAM: ABDOMEN - 1 VIEW COMPARISON:  06/25/2023 abdominal radiograph FINDINGS: Surgical clips overlie the medial high left abdomen and medial lower right abdomen and right groin. No dilated small bowel loops. Mild colonic gas and stool. No evidence of pneumatosis or pneumoperitoneum. Moderate lumbar spondylosis. No radiopaque nephrolithiasis. IMPRESSION: Nonobstructive bowel gas pattern. Electronically Signed   By: Delbert Phenix M.D.   On: 06/29/2023 19:26   DG Chest 2 View  Result Date: 06/29/2023 CLINICAL DATA:  Dyspnea EXAM: CHEST - 2 VIEW COMPARISON:  05/24/2016 chest radiograph. FINDINGS: Intact sternotomy wires. Stable cardiomediastinal silhouette with normal heart size. No pneumothorax. No pleural effusion. Mild eventration of the posterior left hemidiaphragm. No pulmonary edema. No acute consolidative airspace disease. IMPRESSION: No active cardiopulmonary disease. Mild eventration of the posterior left hemidiaphragm. Electronically Signed   By: Delbert Phenix M.D.   On:  06/29/2023 19:25   DG Abd 1 View  Result Date: 06/25/2023 CLINICAL DATA:  Constipation  EXAM: ABDOMEN - 1 VIEW COMPARISON:  None Available. FINDINGS: Bowel gas pattern is nonspecific. Moderate amount of stool is seen in colon and rectum. There is no fecal impaction in rectum. No abnormal masses are seen. Calcifications in left side of pelvis may be vascular. Kidneys are partly obscured by bowel contents limiting evaluation for small renal stones. In 1 of the views, there are 2 small linear radiopacities in right lower quadrant of abdomen. This finding was not distinctly seen in CT abdomen and pelvis done on 12/03/2021. Degenerative changes are noted in lumbar spine, more severe at L1-L2 level. IMPRESSION: Nonspecific bowel gas pattern. Moderate amount of stool is seen in colon. No abnormal masses or calcifications are noted. There are 2 small thin linear metallic densities in right lower quadrant of abdomen which may suggest artifacts or foreign bodies. If clinically warranted, follow-up CT may be considered. Electronically Signed   By: Ernie Avena M.D.   On: 06/25/2023 18:34   ECHOCARDIOGRAM COMPLETE BUBBLE STUDY  Result Date: 06/22/2023    ECHOCARDIOGRAM REPORT   Patient Name:   Hunter Sparks Date of Exam: 06/22/2023 Medical Rec #:  [id number]        Height:       72.0 in Accession #:    [id number]       Weight:       216.0 lb Date of Birth:  March 03, 1946        BSA:          2.201 m Patient Age:    76 years         BP:           163/80 mmHg Patient Gender: M                HR:           86 bpm. [facility] Procedure: 2D Echo, Cardiac Doppler, Color Doppler and Saline Contrast Bubble            Study Indications:    Stroke 434.91 / I63.9  History:        Patient has prior history of Echocardiogram examinations, most                 recent 06/12/2015. CAD, Signs/Symptoms:Altered Mental Status;                 Risk Factors:Hypertension, Diabetes, Former Smoker and                  Dyslipidemia.  Sonographer:    Aron Baba Referring Phys: [id number] [facility]  Sonographer Comments: Suboptimal parasternal window and suboptimal subcostal window. Image acquisition challenging due to uncooperative patient and Image acquisition challenging due to respiratory motion. IMPRESSIONS  1. Left ventricular ejection fraction, by estimation, is 70 to 75%. The left ventricle has hyperdynamic function. The left ventricle has no regional wall motion abnormalities. Left ventricular diastolic parameters are consistent with Grade I diastolic dysfunction (impaired relaxation).  2. Right ventricular systolic function was not well visualized. The right ventricular size is normal.  3. The mitral valve is grossly normal. No evidence of mitral valve regurgitation. No evidence of mitral stenosis.  4. The aortic valve was not well visualized. Aortic valve regurgitation is not visualized. Aortic valve sclerosis/calcification is present, without any evidence of aortic stenosis.  5. Agitated saline contrast bubble study was negative, with no evidence of any interatrial shunt. Comparison(s): No prior Echocardiogram. FINDINGS  Left Ventricle: Left ventricular ejection fraction,  by estimation, is 70 to 75%. The left ventricle has hyperdynamic function. The left ventricle has no regional wall motion abnormalities. The left ventricular internal cavity size was normal in size. There is no left ventricular hypertrophy. Left ventricular diastolic parameters are consistent with Grade I diastolic dysfunction (impaired relaxation). Right Ventricle: The right ventricular size is normal. No increase in right ventricular wall thickness. Right ventricular systolic function was not well visualized. Left Atrium: Left atrial size was normal in size. Right Atrium: Right atrial size was normal in size. Pericardium: There is no evidence of pericardial effusion. Mitral Valve: The mitral valve is grossly normal. There is mild thickening of the  mitral valve leaflet(s). There is moderate calcification of the mitral valve leaflet(s). No evidence of mitral valve regurgitation. No evidence of mitral valve stenosis. Tricuspid Valve: The tricuspid valve is grossly normal. Tricuspid valve regurgitation is trivial. No evidence of tricuspid stenosis. Aortic Valve: The aortic valve was not well visualized. Aortic valve regurgitation is not visualized. Aortic valve sclerosis/calcification is present, without any evidence of aortic stenosis. Pulmonic Valve: The pulmonic valve was grossly normal. Pulmonic valve regurgitation is not visualized. No evidence of pulmonic stenosis. Aorta: The aortic root and ascending aorta are structurally normal, with no evidence of dilitation. Venous: The inferior vena cava was not well visualized. IAS/Shunts: The interatrial septum was not well visualized. Agitated saline contrast was given intravenously to evaluate for intracardiac shunting. Agitated saline contrast bubble study was negative, with no evidence of any interatrial shunt.  LEFT VENTRICLE PLAX 2D LVIDd:         4.50 cm   Diastology LVIDs:         2.60 cm   LV e' medial:    6.42 cm/s LV PW:         1.00 cm   LV E/e' medial:  12.9 LV IVS:        0.50 cm   LV e' lateral:   7.40 cm/s LVOT diam:     1.70 cm   LV E/e' lateral: 11.2 LV SV:         40 LV SV Index:   18 LVOT Area:     2.27 cm  RIGHT VENTRICLE RV S prime:     9.57 cm/s TAPSE (M-mode): 0.8 cm LEFT ATRIUM             Index        RIGHT ATRIUM           Index LA Vol (A2C):   34.6 ml 15.72 ml/m  RA Area:     10.90 cm LA Vol (A4C):   48.6 ml 22.08 ml/m  RA Volume:   19.20 ml  8.72 ml/m LA Biplane Vol: 41.0 ml 18.62 ml/m  AORTIC VALVE LVOT Vmax:   94.60 cm/s LVOT Vmean:  62.900 cm/s LVOT VTI:    0.176 m  AORTA Ao Root diam: 3.40 cm Ao Asc diam:  3.30 cm MITRAL VALVE                TRICUSPID VALVE MV Area (PHT): 3.30 cm     TR Peak grad:   7.5 mmHg MV Decel Time: 230 msec     TR Vmax:        137.00 cm/s MR Peak grad:  2.8 mmHg MR Vmax:      83.80 cm/s    SHUNTS MV E velocity: 82.60 cm/s   Systemic VTI:  0.18 m MV A velocity: 120.00 cm/s  Systemic Diam: 1.70  cm MV E/A ratio:  0.69 Vishnu Priya Mallipeddi Electronically signed by Winfield Rast Mallipeddi Signature Date/Time: 06/22/2023/4:43:03 PM    Final    MR BRAIN WO CONTRAST  Result Date: 06/21/2023 CLINICAL DATA:  Stroke suspected EXAM: MRI HEAD WITHOUT CONTRAST MRA HEAD WITHOUT CONTRAST TECHNIQUE: Multiplanar, multi-echo pulse sequences of the brain and surrounding structures were acquired without intravenous contrast. Angiographic images of the Circle of Willis were acquired using MRA technique without intravenous contrast. COMPARISON:  Same-day CT head FINDINGS: MRI HEAD FINDINGS Brain: There are small acute infarcts in the right lentiform nucleus and caudate tail, a small acute cortical infarct in the right occipital lobe, and punctate acute cortical infarcts in the frontal and parietal lobes. There is no hemorrhage or mass effect. There is no acute intracranial hemorrhage or extra-axial fluid collection There is background parenchymal volume loss with prominence of the ventricular system and extra-axial CSF spaces. Patchy FLAIR signal abnormality in the supratentorial white matter likely reflects sequela of moderate underlying chronic small-vessel ischemic change. There are additional remote lacunar infarcts in the right basal ganglia, pons, left corona radiata and bilateral cerebellar hemispheres. The pituitary and suprasellar region are normal. There is no mass lesion. There is no mass effect or midline shift. Vascular: See below. Skull and upper cervical spine: Normal marrow signal. Sinuses/Orbits: The paranasal sinuses are clear. The globes and orbits are unremarkable. Other: The mastoid air cells and middle ear cavities are clear. MRA HEAD FINDINGS Anterior circulation: There is mild atherosclerotic irregularity of the intracranial ICAs without high-grade stenosis or  occlusion. The bilateral MCAs are patent. There is focal moderate stenosis of the origin of the inferior M2 branch (1009-5). The bilateral ACAS are patent, without proximal high-grade stenosis or occlusion. There is no aneurysm or AVM. Posterior circulation: The left V4 segment is occluded, presumed chronic. The right V4 segment is patent. There is multifocal atherosclerotic irregularity of the basilar artery with moderate stenosis proximally. The PCAs are primarily supplied by prominent posterior communicating arteries bilaterally (fetal origins). There is moderate stenosis of the proximal left P2 segment (12-94), and moderate stenosis of the distal right posterior communicating artery. There is no aneurysm or AVM. Anatomic variants: As above. IMPRESSION: 1. 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 2. Multiple additional small remote infarcts and background chronic small-vessel ischemic change as above. 3. 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 Electronically Signed   By: Lesia Hausen M.D.   On: 06/21/2023 15:11   MR ANGIO HEAD WO CONTRAST  Result Date: 06/21/2023 CLINICAL DATA:  Stroke suspected EXAM: MRI HEAD WITHOUT CONTRAST MRA HEAD WITHOUT CONTRAST TECHNIQUE: Multiplanar, multi-echo pulse sequences of the brain and surrounding structures were acquired without intravenous contrast. Angiographic images of the Circle of Willis were acquired using MRA technique without intravenous contrast. COMPARISON:  Same-day CT head FINDINGS: MRI HEAD FINDINGS Brain: There are small acute infarcts in the right lentiform nucleus and caudate tail, a  small acute cortical infarct in the right occipital lobe, and punctate acute cortical infarcts in the frontal and parietal lobes. There is no hemorrhage or mass effect. There is no acute intracranial hemorrhage or extra-axial fluid collection There is background parenchymal volume loss with prominence of  the ventricular system and extra-axial CSF spaces. Patchy FLAIR signal abnormality in the supratentorial white matter likely reflects sequela of moderate underlying chronic small-vessel ischemic change. There are additional remote lacunar infarcts in the right basal ganglia, pons, left corona radiata and bilateral cerebellar hemispheres. The pituitary and suprasellar region are normal. There is no mass lesion. There is no mass effect or midline shift. Vascular: See below. Skull and upper cervical spine: Normal marrow signal. Sinuses/Orbits: The paranasal sinuses are clear. The globes and orbits are unremarkable. Other: The mastoid air cells and middle ear cavities are clear. MRA HEAD FINDINGS Anterior circulation: There is mild atherosclerotic irregularity of the intracranial ICAs without high-grade stenosis or occlusion. The bilateral MCAs are patent. There is focal moderate stenosis of the origin of the inferior M2 branch (1009-5). The bilateral ACAS are patent, without proximal high-grade stenosis or occlusion. There is no aneurysm or AVM. Posterior circulation: The left V4 segment is occluded, presumed chronic. The right V4 segment is patent. There is multifocal atherosclerotic irregularity of the basilar artery with moderate stenosis proximally. The PCAs are primarily supplied by prominent posterior communicating arteries bilaterally (fetal origins). There is moderate stenosis of the proximal left P2 segment (12-94), and moderate stenosis of the distal right posterior communicating artery. There is no aneurysm or AVM. Anatomic variants: As above. IMPRESSION: 1. Scattered small acute to early subacute infarcts  in the right cerebral hemisphere involving the basal ganglia and frontal, parietal, and occipital cortex in the MCA and MCA/PCA watershed distributions. 2. Multiple additional small remote infarcts and background chronic small-vessel ischemic change as above. 3. 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 Electronically Signed   By: Lesia Hausen M.D.   On: 06/21/2023 15:11   US Carotid Bilateral  Result Date: 06/21/2023 CLINICAL DATA:  Left-sided weakness and history of hypertension, hyperlipidemia, diabetes, coronary artery disease and prior smoking history. EXAM: BILATERAL CAROTID DUPLEX ULTRASOUND TECHNIQUE: Wallace Cullens scale imaging, color Doppler and duplex ultrasound were performed of bilateral carotid and vertebral arteries in the neck. COMPARISON:  None Available. FINDINGS: Criteria: Quantification of carotid stenosis is based on velocity parameters that correlate the residual internal carotid diameter with NASCET-based stenosis levels, using the diameter of the distal internal carotid lumen as the denominator for stenosis measurement. The following velocity measurements were obtained: RIGHT ICA:  92/8 cm/sec CCA:  106/8 cm/sec SYSTOLIC ICA/CCA RATIO:  0.9 ECA:  131 cm/sec LEFT ICA:  114/22 cm/sec CCA:  111/17 cm/sec SYSTOLIC ICA/CCA RATIO:  1.0 ECA:  88 cm/sec RIGHT CAROTID ARTERY: Minimal plaque at the right ICA origin. Estimated right ICA stenosis is less than 50%. RIGHT VERTEBRAL ARTERY: Antegrade flow with normal waveform and velocity. LEFT CAROTID ARTERY: Mild to moderate partially calcified plaque at the level of the carotid bulb extending into the proximal left ICA. Estimated left ICA stenosis is less than 50%. LEFT VERTEBRAL ARTERY: Antegrade flow with normal waveform and velocity.  IMPRESSION: 1. Mild to moderate partially calcified plaque at the left carotid bulb extending into the proximal left ICA. Estimated left ICA stenosis is less than 50%. 2. Minimal plaque at the right ICA origin. Estimated right ICA stenosis is less than 50%. Electronically Signed   By: Irish Lack M.D.   On: 06/21/2023 12:15   CT HEAD CODE STROKE WO CONTRAST  Result Date: 06/21/2023 CLINICAL DATA:  Code stroke.  Acute neurologic deficit EXAM: CT HEAD WITHOUT CONTRAST TECHNIQUE: Contiguous axial images were obtained from the base of the skull through the vertex without intravenous contrast. RADIATION DOSE REDUCTION: This exam was performed according to the departmental dose-optimization program which includes automated exposure control, adjustment of the mA and/or kV according to patient size and/or use of iterative reconstruction technique. COMPARISON:  None Available. FINDINGS: Brain: There is no mass, hemorrhage or extra-axial collection. There is generalized atrophy without lobar predilection. Old bilateral cerebellar infarcts. There is periventricular hypoattenuation compatible with chronic microvascular disease. Vascular: No abnormal hyperdensity of the major intracranial arteries or dural venous sinuses. No intracranial atherosclerosis. Skull: The visualized skull base, calvarium and extracranial soft tissues are normal. Sinuses/Orbits: No fluid levels or advanced mucosal thickening of the visualized paranasal sinuses. No mastoid or middle ear effusion. The orbits are normal. ASPECTS [facility] Stroke Program Early CT Score) - Ganglionic level infarction (caudate, lentiform nuclei, internal capsule, insula, M1-M3 cortex): 7 - Supraganglionic infarction (M4-M6 cortex): 3 Total score (0-10 with 10 being normal): 10 IMPRESSION: 1. No acute intracranial abnormality. 2. ASPECTS is 10. These results were called by telephone at the time of interpretation on 06/21/2023 at 12:47 am to provider Geoffery Lyons , who  verbally acknowledged these results. Electronically Signed   By: Deatra Robinson M.D.   On: 06/21/2023 00:47    Labs:  Basic Metabolic Panel: Recent Labs  Lab 06/29/23 0528 07/02/23 0520 07/02/23 1619  NA 137 133* 133*  K 3.7 4.2 4.3  CL 100 94* 95*  CO2 25 25 25   GLUCOSE 101* 244* 176*  BUN 16 19 21   CREATININE 1.30* 1.27* 1.30*  CALCIUM 9.0 9.4 9.1    CBC: Recent Labs  Lab 06/29/23 0528 07/02/23 0520 07/02/23 1619  WBC 8.6 16.1* 22.5*  NEUTROABS  --  12.7*  --   HGB 13.9 16.4 17.5*  HCT 40.0 46.2 50.5  MCV 88.7 90.6 92.0  PLT 188 231 219    CBG: Recent Labs  Lab 07/01/23 2127 07/02/23 0408 07/02/23 0618 07/02/23 1132 07/02/23 1633  GLUCAP 224* 248* 253* 213* 186*    Brief HPI:   Hunter Sparks is a 77 y.o. right-handed male with history of hypertension hyperlipidemia insulin-dependent diabetes mellitus CAD, CKD stage III BPH hypothyroidism admitted 06/21/2023 after being found with slurred speech and left-sided weakness.  Patient had stroke workup MRI showed scattered small infarct right basal ganglia, frontal, parietal, occipital lobes-MCA/PCA watershed distribution.  Patient was seen by neurology services and placed on low-dose aspirin and Plavix x 3 weeks then aspirin alone.  Close monitoring of permissive hypertension.  Therapy evaluations completed due to patient decreased functional mobility left-sided weakness was admitted for a comprehensive rehab program.   Hospital Course: HOLTON HEMANI was admitted to rehab 06/23/2023 for inpatient therapies to consist of PT, ST and OT at least three hours five days a week. Past admission physiatrist, therapy team and rehab RN have worked together to provide customized collaborative inpatient rehab.  Pertaining to patient's right brain scattered infarct watershed distribution left hemiparesis.  Follow-up MRI due to some questionable cognitive changes showed no acute changes.  Remained on low-dose aspirin and Plavix x 3  weeks then aspirin alone.  Pain manager use of tramadol discontinued due to some cognitive changes.  He was using trazodone as scheduled from prior to admission for sleep.  CAD/hypertension permissive hypertension he did require hydralazine as needed slow reintroduction of metoprolol.  CKD stage III creatinine baseline  1.2-1.3 due to some ongoing bouts of nausea vomiting was placed on IV fluids for monitoring of hydration.  Blood sugars hemoglobin A1c 8.8 insulin therapy as directed.  Hypothyroidism with Synthroid replacement.  Grade 1 diastolic dysfunction monitoring for any signs of fluid overload.  On 07/02/2023 findings of some cognitive changes with nausea vomiting right upper quadrant discomfort.  Low-grade fever 99.1 WBC 16,100 lactic acid elevated 2.9 with follow-up WBC of 22,500.  He did have some hyponatremia workup ongoing.  Blood cultures were pending.  Due to these ongoing medical changes hospitalist team was consulted patient was discharged to acute care services for workup of gallbladder as well as possible sepsis.   Blood pressures were monitored on TID basis and hydralazine added as needed  Diabetes has been monitored with ac/hs CBG checks and SSI was use prn for tighter BS control.    Rehab course: During patient's stay in rehab weekly team conferences were held to monitor patient's progress, set goals and discuss barriers to discharge. At admission, patient required mod assist with mobility min mod assist lower body tasks performed for basic self-care ADLs  He/She  has had improvement in activity tolerance, balance, postural control as well as ability to compensate for deficits. He/She has had improvement in functional use RUE/LUE  and RLE/LLE as well as improvement in awareness.  Sessions focused on BADL reeducation.  Completed squat pivot to wheelchair minimal assist additional squat pivot to bedside commode over toilet using grab bars as needed.  Contact-guard for 3/3 toileting tasks.   Completed squat pivot to the shower seat for bathing completed bathing with overall contact-guard.  Ambulate 72 feet x 2 with Swedish walker min mod assist.  Due to patient's medical changes hospitalist team had been consulted patient was discharged to acute care services.       Disposition: Discharged to acute care services    Diet: 1200 mL fluid restriction  Special Instructions: Medications dictated as per hospitalist team.  30-35 minutes were spent completing discharge summary and discharge planning      Signed: Charlton Amor 07/02/2023, 6:34 PM

## 2023-07-02 NOTE — Progress Notes (Signed)
Noted large hematoma to right hand. Hand elevated, ice pack applied. Notified on call Deatra Ina, PA and oncoming nurse.    Tilden Dome, LPN

## 2023-07-02 NOTE — Progress Notes (Signed)
Notified Dr. Shearon Stalls of patient's complaint of stabbing pain to RUQ, elevated blood pressures. Patient refused all medications except metoprolol. Noted patient to have some cognitive changes. New orders placed.     Tilden Dome, LPN

## 2023-07-02 NOTE — Progress Notes (Signed)
Physical Therapy Session Note  Patient Details  Name: Hunter Sparks MRN: [id number] Date of Birth: 19-Nov-1945  Today's Date: 07/02/2023 PT Missed Time: 60 Minutes Missed Time Reason: MD hold (Comment) (on MD hold for determination of new stomach pain and nausea)  Pt on medical hold. PT will follow up once pt is medically appropriate to participate.    Edwin Cap PT, DPT 07/02/2023, 2:11 PM

## 2023-07-02 NOTE — Progress Notes (Signed)
Report called to nurse on 4N, waiting for a call back on when room is clean

## 2023-07-02 NOTE — Progress Notes (Signed)
Patient arrived to unit paged [facility] number letting them know of patients arrival. Paged sent out to MD [employer] [facility]   Patient Aox1, vitals taken. Patients wife at bedside. Oriented to room, bed in lowest position and bed alarm on.

## 2023-07-02 NOTE — Subjective & Objective (Addendum)
CC: abd pain, leukocytosis HPI: 77 year old gentleman who was discharged from the hospital will on June 23, 2023 after spending 2 days in the hospital for an acute stroke.  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  He has had left hemiparesis since then.  He was accepted to inpatient rehab and discharged to there to undergo further therapy.  His wife [name] is at the bedside.  She states that he had no difficulty with speech.  His cognition was intact.  He had difficulty with left-sided weakness.  Over the course of the last 2 days, the patient is complaining of severe abdominal pain.  Initially was felt that it was due to constipation.  Patient developed a low-grade temp of 99.9 and a new oxygen requirement.  White count was 22.5.  Previously it was 8.6 on 06/29/2023.  Lactic acid was elevated 2.9.  Was given IV fluids with improvement in his lactic acid.  Decision made to readmit him to the hospital for further workup.  He was given a dose of IV Levaquin and Flagyl.  His wife states the patient's penicillin allergy is a rash but that was many years ago.  She thinks the patient can tolerate penicillin.  Right upper quadrant ultrasound was ordered but not yet performed.  Patient was transferred prior to right upper quadrant ultrasound being performed.  Patient still having pain.  Wife thinks the patient's mentation is worse.  Brain MRI performed today shows Interval evolution of the previously described right MCA infarct.  Patient was agitated and required mittens.

## 2023-07-02 NOTE — Progress Notes (Signed)
Called 4N to see progress on room cleaning for transporting the patient off floor. Writer informed by staff that room was not clean. EVS called by writer and STAT room clean initiated for transport. Will continue to monitor.

## 2023-07-02 NOTE — Progress Notes (Addendum)
Notified on call Deatra Ina, PA and Dr.Engler of critical lab value for lactic acid. New orders placed. Notified oncoming nurse.   Tilden Dome, LPN

## 2023-07-02 NOTE — Consult Note (Addendum)
Plan of Care Note for accepted transfer   Patient: Hunter Sparks MRN: [id number]   DOA: (Not on file)  Facility requesting transfer: Inpatient rehab Requesting Provider: Dr. Shearon Stalls Reason for transfer: SIRS, leukocytosis, altered mental status, concern for developing sepsis.  Facility course: Patient initially admitted for stroke from 8/8 until 8/10.  Discharged inpatient rehab following this.  This morning noted to have right upper quadrant pain, confusion, decreased p.o. tolerance, nausea, vomiting, some fecal incontinence in the morning.  Found to have leukocytosis to 16 worsened by the evening to 22.  Urinalysis without acute abnormality.  Chest x-ray with no acute disease.  MR brain with only evolution of known stroke.  Right upper quadrant pain concerning for intra-abdominal/gallbladder etiology.  Lactic acid initially elevated to 2.9.  Started on maintenance fluid.  Given penicillin allergy, I have started ofloxacin and Flagyl.  Ordered for IV fluid bolus while awaiting bed placement.  Trend lactic acid.  Also have ordered for right upper quadrant ultrasound, can consider abdominal CT if significant delay in getting ultrasound or further detail needed.  Initially excepted to telemetry bed but due to elevated lactic acid without intervention and decrease in lactic acid unable to be excepted to 48M.  Upgraded to progressive bed to avoid further similar delays.  On exam, patient was tachycardic, confused, significant right upper quadrant tenderness with positive Murphy sign.  Plan of care: The patient is accepted for admission to Progressive unit, at [facility].  Author: Synetta Fail, MD 07/02/2023  Check [email or web address] for on-call coverage.  Nursing staff, Please call [facility]ults [facility] number on Amion as soon as patient's arrival, so appropriate admitting provider can evaluate the pt.

## 2023-07-02 NOTE — Plan of Care (Signed)
  Problem: RH BOWEL ELIMINATION Goal: RH STG MANAGE BOWEL WITH ASSISTANCE Description: STG Manage Bowel with min Assistance. Outcome: Progressing   Problem: RH BLADDER ELIMINATION Goal: RH STG MANAGE BLADDER WITH ASSISTANCE Description: STG Manage Bladder With min Assistance Outcome: Progressing   Problem: RH SKIN INTEGRITY Goal: RH STG SKIN FREE OF INFECTION/BREAKDOWN Description: Skin will remain intact and free of infection/breakdown with min assist  Outcome: Progressing   Problem: RH SAFETY Goal: RH STG ADHERE TO SAFETY PRECAUTIONS W/ASSISTANCE/DEVICE Description: STG Adhere to Safety Precautions With cueing Assistance/Device. Outcome: Progressing   Problem: RH PAIN MANAGEMENT Goal: RH STG PAIN MANAGED AT OR BELOW PT'S PAIN GOAL Description: Pain will be managed 4 out of 10 on pain scale with PRN medications min assist  Outcome: Progressing

## 2023-07-02 NOTE — Consult Note (Signed)
  07/02/2023 9 AM-10 AM:  Attempted to assess patient today.  Patient was alone in his room sitting up in his bed and asleep as I entered the room.  Patient had towel over his chest noting nausea vomiting recently.  Patient reports that he was feeling poorly and had turned down previous therapy at 8.  Patient clearly having difficulty with orientation and awareness.  Patient has had watershed stroke event impacting multiple areas of the brain including right basal ganglia, frontal/parietal/occipital lobe with history of previous lacunar infarct impacting right basal ganglia, pons, left corona radiata and bilateral cerebellar hemispheres.  Patient also with cortical volume loss and indications of moderate microvascular ischemic changes/small vessel disease.  Patient likely had progressing changes cognitively with previous strokes.  Difficult to assess today due to nausea.  Patient with likely mild cognitive impairment prior and now worsening post new stroke.  Hunter Sparks, Psy.D

## 2023-07-02 NOTE — Progress Notes (Signed)
Occupational Therapy Note  Patient Details  Name: RORI MCAVOY MRN: [id number] Date of Birth: 06-04-1946  Today's Date: 07/02/2023 OT Missed Time: 60 Minutes Missed Time Reason: MD hold (comment) (due to change in medical status)  Pt on medical hold. OT will follow up once pt cleared to resume therapies.    [name] Karla Vines 07/02/2023, 11:24 AM

## 2023-07-02 NOTE — Progress Notes (Addendum)
Dr. Shearon Stalls and Wendi Maya made aware of UA results and MRI Results. Notified oncoming nurse.   Tilden Dome, LPN

## 2023-07-02 NOTE — Assessment & Plan Note (Signed)
Pt was at inpatient rehab prior to this admission for acute abd pain. Should go back to inpatient rehab at discharge.

## 2023-07-02 NOTE — Assessment & Plan Note (Signed)
Continue with lantus and SSI.

## 2023-07-03 ENCOUNTER — Observation Stay (HOSPITAL_COMMUNITY): Payer: PPO

## 2023-07-03 DIAGNOSIS — K831 Obstruction of bile duct: Secondary | ICD-10-CM | POA: Diagnosis not present

## 2023-07-03 DIAGNOSIS — M1909 Primary osteoarthritis, other specified site: Secondary | ICD-10-CM | POA: Diagnosis not present

## 2023-07-03 DIAGNOSIS — I25110 Atherosclerotic heart disease of native coronary artery with unstable angina pectoris: Secondary | ICD-10-CM | POA: Diagnosis not present

## 2023-07-03 DIAGNOSIS — N179 Acute kidney failure, unspecified: Secondary | ICD-10-CM | POA: Diagnosis not present

## 2023-07-03 DIAGNOSIS — R739 Hyperglycemia, unspecified: Secondary | ICD-10-CM | POA: Diagnosis not present

## 2023-07-03 DIAGNOSIS — Z8249 Family history of ischemic heart disease and other diseases of the circulatory system: Secondary | ICD-10-CM | POA: Diagnosis not present

## 2023-07-03 DIAGNOSIS — I129 Hypertensive chronic kidney disease with stage 1 through stage 4 chronic kidney disease, or unspecified chronic kidney disease: Secondary | ICD-10-CM | POA: Diagnosis not present

## 2023-07-03 DIAGNOSIS — K8001 Calculus of gallbladder with acute cholecystitis with obstruction: Secondary | ICD-10-CM | POA: Diagnosis not present

## 2023-07-03 DIAGNOSIS — R1011 Right upper quadrant pain: Secondary | ICD-10-CM | POA: Diagnosis not present

## 2023-07-03 DIAGNOSIS — K5901 Slow transit constipation: Secondary | ICD-10-CM | POA: Diagnosis not present

## 2023-07-03 DIAGNOSIS — M25561 Pain in right knee: Secondary | ICD-10-CM | POA: Diagnosis not present

## 2023-07-03 DIAGNOSIS — K8020 Calculus of gallbladder without cholecystitis without obstruction: Secondary | ICD-10-CM | POA: Diagnosis not present

## 2023-07-03 DIAGNOSIS — R6520 Severe sepsis without septic shock: Secondary | ICD-10-CM | POA: Diagnosis not present

## 2023-07-03 DIAGNOSIS — E785 Hyperlipidemia, unspecified: Secondary | ICD-10-CM | POA: Diagnosis not present

## 2023-07-03 DIAGNOSIS — G9341 Metabolic encephalopathy: Secondary | ICD-10-CM | POA: Diagnosis not present

## 2023-07-03 DIAGNOSIS — F17220 Nicotine dependence, chewing tobacco, uncomplicated: Secondary | ICD-10-CM | POA: Diagnosis not present

## 2023-07-03 DIAGNOSIS — K567 Ileus, unspecified: Secondary | ICD-10-CM | POA: Diagnosis not present

## 2023-07-03 DIAGNOSIS — K819 Cholecystitis, unspecified: Principal | ICD-10-CM | POA: Diagnosis present

## 2023-07-03 DIAGNOSIS — F32A Depression, unspecified: Secondary | ICD-10-CM | POA: Diagnosis not present

## 2023-07-03 DIAGNOSIS — I2510 Atherosclerotic heart disease of native coronary artery without angina pectoris: Secondary | ICD-10-CM | POA: Diagnosis not present

## 2023-07-03 DIAGNOSIS — K810 Acute cholecystitis: Secondary | ICD-10-CM | POA: Diagnosis not present

## 2023-07-03 DIAGNOSIS — M25562 Pain in left knee: Secondary | ICD-10-CM | POA: Diagnosis not present

## 2023-07-03 DIAGNOSIS — M160 Bilateral primary osteoarthritis of hip: Secondary | ICD-10-CM | POA: Diagnosis not present

## 2023-07-03 DIAGNOSIS — A419 Sepsis, unspecified organism: Secondary | ICD-10-CM | POA: Diagnosis not present

## 2023-07-03 DIAGNOSIS — G8929 Other chronic pain: Secondary | ICD-10-CM | POA: Diagnosis not present

## 2023-07-03 DIAGNOSIS — E43 Unspecified severe protein-calorie malnutrition: Secondary | ICD-10-CM | POA: Diagnosis not present

## 2023-07-03 DIAGNOSIS — E1165 Type 2 diabetes mellitus with hyperglycemia: Secondary | ICD-10-CM | POA: Diagnosis not present

## 2023-07-03 DIAGNOSIS — F0280 Dementia in other diseases classified elsewhere without behavioral disturbance: Secondary | ICD-10-CM | POA: Diagnosis not present

## 2023-07-03 DIAGNOSIS — Y92239 Unspecified place in hospital as the place of occurrence of the external cause: Secondary | ICD-10-CM | POA: Diagnosis not present

## 2023-07-03 DIAGNOSIS — Z66 Do not resuscitate: Secondary | ICD-10-CM | POA: Diagnosis not present

## 2023-07-03 DIAGNOSIS — I10 Essential (primary) hypertension: Secondary | ICD-10-CM | POA: Diagnosis not present

## 2023-07-03 DIAGNOSIS — K838 Other specified diseases of biliary tract: Secondary | ICD-10-CM | POA: Diagnosis not present

## 2023-07-03 DIAGNOSIS — Z951 Presence of aortocoronary bypass graft: Secondary | ICD-10-CM | POA: Diagnosis not present

## 2023-07-03 DIAGNOSIS — Z515 Encounter for palliative care: Secondary | ICD-10-CM | POA: Diagnosis not present

## 2023-07-03 DIAGNOSIS — F02B18 Dementia in other diseases classified elsewhere, moderate, with other behavioral disturbance: Secondary | ICD-10-CM | POA: Diagnosis not present

## 2023-07-03 DIAGNOSIS — K8000 Calculus of gallbladder with acute cholecystitis without obstruction: Secondary | ICD-10-CM | POA: Diagnosis not present

## 2023-07-03 DIAGNOSIS — T85590A Other mechanical complication of bile duct prosthesis, initial encounter: Secondary | ICD-10-CM | POA: Diagnosis not present

## 2023-07-03 DIAGNOSIS — E119 Type 2 diabetes mellitus without complications: Secondary | ICD-10-CM | POA: Diagnosis not present

## 2023-07-03 DIAGNOSIS — Z79899 Other long term (current) drug therapy: Secondary | ICD-10-CM | POA: Diagnosis not present

## 2023-07-03 DIAGNOSIS — Z794 Long term (current) use of insulin: Secondary | ICD-10-CM | POA: Diagnosis not present

## 2023-07-03 DIAGNOSIS — R109 Unspecified abdominal pain: Secondary | ICD-10-CM | POA: Diagnosis not present

## 2023-07-03 DIAGNOSIS — Z7189 Other specified counseling: Secondary | ICD-10-CM | POA: Diagnosis not present

## 2023-07-03 DIAGNOSIS — K5900 Constipation, unspecified: Secondary | ICD-10-CM | POA: Diagnosis not present

## 2023-07-03 DIAGNOSIS — E039 Hypothyroidism, unspecified: Secondary | ICD-10-CM | POA: Diagnosis not present

## 2023-07-03 DIAGNOSIS — N1831 Chronic kidney disease, stage 3a: Secondary | ICD-10-CM | POA: Diagnosis not present

## 2023-07-03 DIAGNOSIS — Y828 Other medical devices associated with adverse incidents: Secondary | ICD-10-CM | POA: Diagnosis not present

## 2023-07-03 DIAGNOSIS — Z823 Family history of stroke: Secondary | ICD-10-CM | POA: Diagnosis not present

## 2023-07-03 DIAGNOSIS — R918 Other nonspecific abnormal finding of lung field: Secondary | ICD-10-CM | POA: Diagnosis not present

## 2023-07-03 DIAGNOSIS — R32 Unspecified urinary incontinence: Secondary | ICD-10-CM | POA: Diagnosis not present

## 2023-07-03 DIAGNOSIS — I639 Cerebral infarction, unspecified: Secondary | ICD-10-CM | POA: Diagnosis not present

## 2023-07-03 DIAGNOSIS — E1122 Type 2 diabetes mellitus with diabetic chronic kidney disease: Secondary | ICD-10-CM | POA: Diagnosis not present

## 2023-07-03 DIAGNOSIS — Z85828 Personal history of other malignant neoplasm of skin: Secondary | ICD-10-CM | POA: Diagnosis not present

## 2023-07-03 DIAGNOSIS — K760 Fatty (change of) liver, not elsewhere classified: Secondary | ICD-10-CM | POA: Diagnosis not present

## 2023-07-03 DIAGNOSIS — I69354 Hemiplegia and hemiparesis following cerebral infarction affecting left non-dominant side: Secondary | ICD-10-CM | POA: Diagnosis not present

## 2023-07-03 DIAGNOSIS — Z79890 Hormone replacement therapy (postmenopausal): Secondary | ICD-10-CM | POA: Diagnosis not present

## 2023-07-03 DIAGNOSIS — R140 Abdominal distension (gaseous): Secondary | ICD-10-CM | POA: Diagnosis not present

## 2023-07-03 DIAGNOSIS — K828 Other specified diseases of gallbladder: Secondary | ICD-10-CM | POA: Diagnosis not present

## 2023-07-03 DIAGNOSIS — Z87442 Personal history of urinary calculi: Secondary | ICD-10-CM | POA: Diagnosis not present

## 2023-07-03 DIAGNOSIS — M545 Low back pain, unspecified: Secondary | ICD-10-CM | POA: Diagnosis not present

## 2023-07-03 HISTORY — PX: IR PERC CHOLECYSTOSTOMY: IMG2326

## 2023-07-03 LAB — CBC WITH DIFFERENTIAL/PLATELET
Abs Immature Granulocytes: 0.18 10*3/uL — ABNORMAL HIGH (ref 0.00–0.07)
Basophils Absolute: 0 10*3/uL (ref 0.0–0.1)
Basophils Relative: 0 %
Eosinophils Absolute: 0 10*3/uL (ref 0.0–0.5)
Eosinophils Relative: 0 %
HCT: 41.6 % (ref 39.0–52.0)
Hemoglobin: 14.4 g/dL (ref 13.0–17.0)
Immature Granulocytes: 1 %
Lymphocytes Relative: 6 %
Lymphs Abs: 1.5 10*3/uL (ref 0.7–4.0)
MCH: 30.9 pg (ref 26.0–34.0)
MCHC: 34.6 g/dL (ref 30.0–36.0)
MCV: 89.3 fL (ref 80.0–100.0)
Monocytes Absolute: 2.7 10*3/uL — ABNORMAL HIGH (ref 0.1–1.0)
Monocytes Relative: 10 %
Neutro Abs: 21.8 10*3/uL — ABNORMAL HIGH (ref 1.7–7.7)
Neutrophils Relative %: 83 %
Platelets: 227 10*3/uL (ref 150–400)
RBC: 4.66 MIL/uL (ref 4.22–5.81)
RDW: 12.3 % (ref 11.5–15.5)
WBC: 26.3 10*3/uL — ABNORMAL HIGH (ref 4.0–10.5)
nRBC: 0 % (ref 0.0–0.2)

## 2023-07-03 LAB — GLUCOSE, CAPILLARY
Glucose-Capillary: 110 mg/dL — ABNORMAL HIGH (ref 70–99)
Glucose-Capillary: 116 mg/dL — ABNORMAL HIGH (ref 70–99)
Glucose-Capillary: 139 mg/dL — ABNORMAL HIGH (ref 70–99)
Glucose-Capillary: 153 mg/dL — ABNORMAL HIGH (ref 70–99)
Glucose-Capillary: 183 mg/dL — ABNORMAL HIGH (ref 70–99)
Glucose-Capillary: 69 mg/dL — ABNORMAL LOW (ref 70–99)
Glucose-Capillary: 78 mg/dL (ref 70–99)

## 2023-07-03 LAB — PROTIME-INR
INR: 1.3 — ABNORMAL HIGH (ref 0.8–1.2)
Prothrombin Time: 16.2 seconds — ABNORMAL HIGH (ref 11.4–15.2)

## 2023-07-03 LAB — COMPREHENSIVE METABOLIC PANEL
ALT: 16 U/L (ref 0–44)
AST: 20 U/L (ref 15–41)
Albumin: 2.8 g/dL — ABNORMAL LOW (ref 3.5–5.0)
Alkaline Phosphatase: 65 U/L (ref 38–126)
Anion gap: 7 (ref 5–15)
BUN: 22 mg/dL (ref 8–23)
CO2: 26 mmol/L (ref 22–32)
Calcium: 8.3 mg/dL — ABNORMAL LOW (ref 8.9–10.3)
Chloride: 100 mmol/L (ref 98–111)
Creatinine, Ser: 1.3 mg/dL — ABNORMAL HIGH (ref 0.61–1.24)
GFR, Estimated: 56 mL/min — ABNORMAL LOW (ref >60)
Glucose, Bld: 180 mg/dL — ABNORMAL HIGH (ref 70–99)
Potassium: 4.7 mmol/L (ref 3.5–5.1)
Sodium: 133 mmol/L — ABNORMAL LOW (ref 135–145)
Total Bilirubin: 1.4 mg/dL — ABNORMAL HIGH (ref 0.3–1.2)
Total Protein: 6.2 g/dL — ABNORMAL LOW (ref 6.5–8.1)

## 2023-07-03 LAB — MAGNESIUM: Magnesium: 1.9 mg/dL (ref 1.7–2.4)

## 2023-07-03 LAB — LACTIC ACID, PLASMA: Lactic Acid, Venous: 1.2 mmol/L (ref 0.5–1.9)

## 2023-07-03 MED ORDER — DEXTROSE 50 % IV SOLN
INTRAVENOUS | Status: AC
  Filled 2023-07-03: qty 50

## 2023-07-03 MED ORDER — FENTANYL CITRATE (PF) 100 MCG/2ML IJ SOLN
INTRAMUSCULAR | Status: AC | PRN
  Administered 2023-07-03: 50 ug via INTRAVENOUS

## 2023-07-03 MED ORDER — CLOPIDOGREL BISULFATE 75 MG PO TABS
75.0000 mg | ORAL_TABLET | Freq: Every day | ORAL | Status: DC
  Administered 2023-07-03 – 2023-07-14 (×12): 75 mg via ORAL
  Filled 2023-07-03 (×12): qty 1

## 2023-07-03 MED ORDER — SODIUM CHLORIDE 0.9% FLUSH
5.0000 mL | Freq: Three times a day (TID) | INTRAVENOUS | Status: DC
  Administered 2023-07-03 – 2023-07-14 (×33): 5 mL

## 2023-07-03 MED ORDER — ONDANSETRON HCL 4 MG/2ML IJ SOLN
4.0000 mg | Freq: Four times a day (QID) | INTRAMUSCULAR | Status: DC | PRN
  Administered 2023-07-05: 4 mg via INTRAVENOUS
  Filled 2023-07-03: qty 2

## 2023-07-03 MED ORDER — SODIUM CHLORIDE 0.9 % IV SOLN
2.0000 g | INTRAVENOUS | Status: AC
  Administered 2023-07-03: 2 g via INTRAVENOUS
  Filled 2023-07-03: qty 2

## 2023-07-03 MED ORDER — ASPIRIN 81 MG PO TBEC
81.0000 mg | DELAYED_RELEASE_TABLET | Freq: Every day | ORAL | Status: DC
  Administered 2023-07-03 – 2023-07-14 (×12): 81 mg via ORAL
  Filled 2023-07-03 (×12): qty 1

## 2023-07-03 MED ORDER — INSULIN DETEMIR 100 UNIT/ML ~~LOC~~ SOLN
20.0000 [IU] | Freq: Every day | SUBCUTANEOUS | Status: DC
  Administered 2023-07-03 – 2023-07-05 (×3): 20 [IU] via SUBCUTANEOUS
  Filled 2023-07-03 (×4): qty 0.2

## 2023-07-03 MED ORDER — FENTANYL CITRATE (PF) 100 MCG/2ML IJ SOLN
INTRAMUSCULAR | Status: AC | PRN
  Administered 2023-07-03: 25 ug via INTRAVENOUS

## 2023-07-03 MED ORDER — KETOROLAC TROMETHAMINE 30 MG/ML IJ SOLN
INTRAMUSCULAR | Status: AC
  Filled 2023-07-03: qty 1

## 2023-07-03 MED ORDER — KETOROLAC TROMETHAMINE 15 MG/ML IJ SOLN
INTRAMUSCULAR | Status: AC | PRN
  Administered 2023-07-03: 15 mg via INTRAVENOUS

## 2023-07-03 MED ORDER — TRAZODONE HCL 100 MG PO TABS
100.0000 mg | ORAL_TABLET | Freq: Every day | ORAL | Status: DC
  Administered 2023-07-03 – 2023-07-13 (×9): 100 mg via ORAL
  Filled 2023-07-03 (×12): qty 1

## 2023-07-03 MED ORDER — DEXTROSE 50 % IV SOLN
12.5000 g | INTRAVENOUS | Status: AC
  Administered 2023-07-03: 12.5 g via INTRAVENOUS

## 2023-07-03 MED ORDER — SODIUM CHLORIDE 0.9 % IV BOLUS
500.0000 mL | Freq: Once | INTRAVENOUS | Status: AC
  Administered 2023-07-03: 500 mL via INTRAVENOUS

## 2023-07-03 MED ORDER — ACETAMINOPHEN 325 MG PO TABS
650.0000 mg | ORAL_TABLET | Freq: Four times a day (QID) | ORAL | Status: DC | PRN
  Administered 2023-07-03 – 2023-07-05 (×4): 650 mg via ORAL
  Filled 2023-07-03 (×4): qty 2

## 2023-07-03 MED ORDER — IOHEXOL 300 MG/ML  SOLN
100.0000 mL | Freq: Once | INTRAMUSCULAR | Status: AC | PRN
  Administered 2023-07-03: 20 mL

## 2023-07-03 MED ORDER — ATORVASTATIN CALCIUM 40 MG PO TABS
40.0000 mg | ORAL_TABLET | Freq: Every day | ORAL | Status: DC
  Administered 2023-07-03 – 2023-07-08 (×6): 40 mg via ORAL
  Filled 2023-07-03 (×6): qty 1

## 2023-07-03 MED ORDER — MIDAZOLAM HCL 2 MG/2ML IJ SOLN
INTRAMUSCULAR | Status: AC
  Filled 2023-07-03: qty 2

## 2023-07-03 MED ORDER — MIDAZOLAM HCL 2 MG/2ML IJ SOLN
INTRAMUSCULAR | Status: AC | PRN
  Administered 2023-07-03: .5 mg via INTRAVENOUS

## 2023-07-03 MED ORDER — FENTANYL CITRATE (PF) 100 MCG/2ML IJ SOLN
INTRAMUSCULAR | Status: AC
  Filled 2023-07-03: qty 2

## 2023-07-03 MED ORDER — METRONIDAZOLE 500 MG/100ML IV SOLN
500.0000 mg | Freq: Two times a day (BID) | INTRAVENOUS | Status: DC
  Administered 2023-07-03 – 2023-07-04 (×3): 500 mg via INTRAVENOUS
  Filled 2023-07-03 (×4): qty 100

## 2023-07-03 MED ORDER — LOSARTAN POTASSIUM 50 MG PO TABS
25.0000 mg | ORAL_TABLET | Freq: Every day | ORAL | Status: DC
  Administered 2023-07-03: 25 mg via ORAL
  Filled 2023-07-03: qty 1

## 2023-07-03 MED ORDER — SODIUM CHLORIDE 0.9 % IV SOLN
2.0000 g | INTRAVENOUS | Status: DC
  Administered 2023-07-03: 2 g via INTRAVENOUS
  Filled 2023-07-03: qty 20

## 2023-07-03 MED ORDER — METOPROLOL TARTRATE 25 MG PO TABS
25.0000 mg | ORAL_TABLET | Freq: Two times a day (BID) | ORAL | Status: DC
  Administered 2023-07-03 – 2023-07-10 (×14): 25 mg via ORAL
  Filled 2023-07-03 (×15): qty 1

## 2023-07-03 MED ORDER — LACTATED RINGERS IV SOLN: INTRAVENOUS | Status: AC

## 2023-07-03 MED ORDER — HYDROMORPHONE HCL 1 MG/ML IJ SOLN
0.5000 mg | INTRAMUSCULAR | Status: DC | PRN
  Administered 2023-07-03 – 2023-07-05 (×6): 0.5 mg via INTRAVENOUS
  Filled 2023-07-03 (×6): qty 0.5

## 2023-07-03 MED ORDER — CIPROFLOXACIN IN D5W 400 MG/200ML IV SOLN
400.0000 mg | Freq: Two times a day (BID) | INTRAVENOUS | Status: DC
  Administered 2023-07-03 – 2023-07-04 (×3): 400 mg via INTRAVENOUS
  Filled 2023-07-03 (×3): qty 200

## 2023-07-03 MED ORDER — HEPARIN SODIUM (PORCINE) 5000 UNIT/ML IJ SOLN
5000.0000 [IU] | Freq: Three times a day (TID) | INTRAMUSCULAR | Status: DC
  Administered 2023-07-03 – 2023-07-14 (×34): 5000 [IU] via SUBCUTANEOUS
  Filled 2023-07-03 (×34): qty 1

## 2023-07-03 MED ORDER — ONDANSETRON HCL 4 MG PO TABS
4.0000 mg | ORAL_TABLET | Freq: Four times a day (QID) | ORAL | Status: DC | PRN
  Administered 2023-07-04: 4 mg via ORAL
  Filled 2023-07-03: qty 1

## 2023-07-03 MED ORDER — LEVOTHYROXINE SODIUM 75 MCG PO TABS
75.0000 ug | ORAL_TABLET | Freq: Every day | ORAL | Status: DC
  Administered 2023-07-03 – 2023-07-14 (×12): 75 ug via ORAL
  Filled 2023-07-03 (×12): qty 1

## 2023-07-03 MED ORDER — LIDOCAINE HCL 1 % IJ SOLN
INTRAMUSCULAR | Status: AC
  Filled 2023-07-03: qty 20

## 2023-07-03 MED ORDER — INSULIN ASPART 100 UNIT/ML IJ SOLN
0.0000 [IU] | INTRAMUSCULAR | Status: DC
  Administered 2023-07-03: 2 [IU] via SUBCUTANEOUS
  Administered 2023-07-03: 1 [IU] via SUBCUTANEOUS
  Administered 2023-07-04: 2 [IU] via SUBCUTANEOUS
  Administered 2023-07-04 (×2): 1 [IU] via SUBCUTANEOUS
  Administered 2023-07-06 (×2): 2 [IU] via SUBCUTANEOUS
  Administered 2023-07-07 (×3): 1 [IU] via SUBCUTANEOUS
  Administered 2023-07-08: 2 [IU] via SUBCUTANEOUS
  Administered 2023-07-08 – 2023-07-09 (×2): 3 [IU] via SUBCUTANEOUS
  Administered 2023-07-09: 2 [IU] via SUBCUTANEOUS
  Administered 2023-07-09: 5 [IU] via SUBCUTANEOUS
  Administered 2023-07-09: 1 [IU] via SUBCUTANEOUS
  Administered 2023-07-09: 3 [IU] via SUBCUTANEOUS
  Administered 2023-07-10: 2 [IU] via SUBCUTANEOUS
  Administered 2023-07-10: 1 [IU] via SUBCUTANEOUS

## 2023-07-03 MED ORDER — ACETAMINOPHEN 650 MG RE SUPP: 650.0000 mg | Freq: Four times a day (QID) | RECTAL | Status: DC | PRN

## 2023-07-03 MED ORDER — LIDOCAINE HCL 1 % IJ SOLN
20.0000 mL | Freq: Once | INTRAMUSCULAR | Status: AC
  Administered 2023-07-03: 10 mL via INTRADERMAL
  Filled 2023-07-03: qty 20

## 2023-07-03 NOTE — Progress Notes (Signed)
   07/03/23 1934  Assess: MEWS Score  Temp 98.1 F (36.7 C)  BP (!) 73/45  MAP (mmHg) (!) 55  Pulse Rate (!) 106  ECG Heart Rate (!) 107  Resp 13  SpO2 96 %  O2 Device Nasal Cannula  O2 Flow Rate (L/min) 2 L/min  Assess: MEWS Score  MEWS Temp 0  MEWS Systolic 2  MEWS Pulse 1  MEWS RR 1  MEWS LOC 1  MEWS Score 5  MEWS Score Color Red  Assess: if the MEWS score is Yellow or Red  Were vital signs accurate and taken at a resting state? Yes  Does the patient meet 2 or more of the SIRS criteria? Yes  Does the patient have a confirmed or suspected source of infection? Yes  MEWS guidelines implemented  Yes, red  Treat  MEWS Interventions Considered administering scheduled or prn medications/treatments as ordered  Take Vital Signs  Increase Vital Sign Frequency  Red: Q1hr x2, continue Q4hrs until patient remains green for 12hrs  Escalate  MEWS: Escalate Red: Discuss with charge nurse and notify provider. Consider notifying RRT. If remains red for 2 hours consider need for higher level of care  Notify: Charge Nurse/RN  Name of Charge Nurse/RN Notified J. Garner Nash APP  Provider Notification  Provider Name/Title Garner Nash  Date Provider Notified 07/03/23  Time Provider Notified 1940  Method of Notification Page  Notification Reason Change in status  Assess: SIRS CRITERIA  SIRS Temperature  0  SIRS Pulse 1  SIRS Respirations  0  SIRS WBC 0  SIRS Score Sum  1   On assessment patients vitals has been trending down. Pts current vitals as above. Pt still Aox1 (self). CBG check 78. On call hospitalist provider J. Garner Nash Paged for further interventions.   1955-Received order for bolus.

## 2023-07-03 NOTE — Progress Notes (Signed)
   RUQ shows acute cholecystitis.  General surgery notified for consult in AM by secure chat. Pt remains NPO, on IVF and IV abx.  Carollee Herter, DO [facility]

## 2023-07-03 NOTE — Progress Notes (Signed)
  Inpatient Rehabilitation Admissions Coordinator   Patient admitted to CIR 8/10 and readmitted to acute on 8/19. I will follow his progress to assist with planning dispo when appropriate.  Ottie Glazier, RN, MSN Rehab Admissions Coordinator [phone] 07/03/2023 8:47 AM

## 2023-07-03 NOTE — Progress Notes (Signed)
Post 2nd bolus   07/03/23 2240  Vitals  BP 110/61 (after 2nd bolus)  MAP (mmHg) 76  BP Location Right Arm  BP Method Automatic  Patient Position (if appropriate) Lying  Pulse Rate 100  Pulse Rate Source Monitor  ECG Heart Rate 99  Resp 16  MEWS COLOR  MEWS Score Color Green  Oxygen Therapy  SpO2 92 %  MEWS Score  MEWS Temp 0  MEWS Systolic 0  MEWS Pulse 0  MEWS RR 0  MEWS LOC 0  MEWS Score 0

## 2023-07-03 NOTE — TOC CM/SW Note (Signed)
Transition of Care [facility]) - Inpatient Brief Assessment   Patient Details  Name: Hunter Sparks MRN: [id number] Date of Birth: [birth year]  [facility]) CM/SW Contact:    Darrold Span, RN Phone Number: 07/03/2023, 1:29 PM   Clinical Narrative: Note pt re-admitted from [facility] 8/19- plan for percutaneous cholecystostomy in IR - recent CVA,  CIR following  We will continue to monitor patient advancement through interdisciplinary progression rounds. If new patient transition needs arise, please place a TOC consult.   Transition of Care Asessment: Insurance and Status: Insurance coverage has been reviewed Patient has primary care physician: Yes Home environment has been reviewed: Home w/ wife Prior level of function:: needs assistance Prior/Current Home Services: No current home services Social Determinants of Health Reivew: SDOH reviewed no interventions necessary Readmission risk has been reviewed: Yes Transition of care needs: no transition of care needs at this time

## 2023-07-03 NOTE — Progress Notes (Signed)
Post-bolus BP 92/48 (62) received new order for another bolus (see mar)

## 2023-07-03 NOTE — Progress Notes (Signed)
[facility]  PROGRESS NOTE  Hunter Sparks XBJ:[id number] DOB: [birth year] DOA: 07/02/2023 PCP: Benita Stabile, MD   Brief HPI:   77-year-old gentleman who was discharged from the hospital will on June 23, 2023 after spending 2 days in the hospital for an acute stroke. 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 He has had left hemiparesis since then. He was accepted to inpatient rehab and discharged to there to undergo further therapy  Patient was at rehab where he complained of severe abdominal pain for 2 days.  WBC was elevated 22.5.  He was sent to the hospital for further workup.  Right upper quadrant ultrasound showed acute cholecystitis.  General surgery was consulted    Assessment/Plan:   Acute cholecystitis -Initially started on Rocephin and Flagyl -General Surgery consulted, cholecystostomy drain recommended per IR -High risk for surgery given being on Plavix and recent stroke -Antibiotics changed to Cipro and Flagyl -Will need outpatient follow-up with general surgery to discuss timing of cholecystectomy  Sepsis due to acute cholecystitis -Presented with sepsis physiology due to acute cholecystitis, -Initial lactic acid 2.9; improved to 1.2 -Sepsis physiology has resolved, blood pressure is stable.  Started on antibiotics as above  Hemiparesis affecting left side as effect of CVA -Recent CVA causing left hemiparesis -Should be able to go back to the inpatient rehab once medically stable -Continue Plavix, aspirin  Diabetes mellitus type 2 -CBG well-controlled -Continue sliding scale insulin NovoLog, insulin detemir  CAD -Continue aspirin, Lipitor, Lopressor  Hypothyroidism -Continue Synthroid  Medications     aspirin EC  81 mg Oral Q breakfast   atorvastatin  40 mg Oral Daily   clopidogrel  75 mg  Oral Daily   heparin  5,000 Units Subcutaneous Q8H   insulin aspart  0-9 Units Subcutaneous Q4H   insulin detemir  20 Units Subcutaneous Daily   levothyroxine  75 mcg Oral Q0600   losartan  25 mg Oral Daily   metoprolol tartrate  25 mg Oral BID   traZODone  100 mg Oral QHS     Data Reviewed:   CBG:  Recent Labs  Lab 07/02/23 1132 07/02/23 1633 07/02/23 2127 07/03/23 0314 07/03/23 0739  GLUCAP 213* 186* 234* 183* 139*    SpO2: 98 %    Vitals:   07/02/23 2229 07/03/23 0312  BP: (!) 194/91 (!) 144/88  Pulse: (!) 127 (!) 106  Resp: 19 12  Temp: 98.5 F (36.9 C) (!) 97.5 F (36.4 C)  TempSrc: Axillary Axillary  SpO2: 91% 98%      Data Reviewed:  Basic Metabolic Panel: Recent Labs  Lab 06/29/23 0528 07/02/23 0520 07/02/23 1619 07/03/23 0249  NA 137 133* 133* 133*  K 3.7 4.2 4.3 4.7  CL 100 94* 95* 100  CO2 25 25 25 26   GLUCOSE 101* 244* 176* 180*  BUN 16 19 21 22   CREATININE 1.30* 1.27* 1.30* 1.30*  CALCIUM 9.0 9.4 9.1 8.3*  MG  --   --   --  1.9    CBC: Recent Labs  Lab 06/29/23 0528 07/02/23 0520 07/02/23 1619 07/03/23 0249  WBC 8.6 16.1* 22.5* 26.3*  NEUTROABS  --  12.7*  --  21.8*  HGB 13.9 16.4 17.5* 14.4  HCT 40.0 46.2 50.5 41.6  MCV 88.7 90.6 92.0 89.3  PLT 188 231 219 227    LFT Recent  Labs  Lab 07/02/23 0520 07/03/23 0249  AST 20 20  ALT 22 16  ALKPHOS 87 65  BILITOT 0.9 1.4*  PROT 7.6 6.2*  ALBUMIN 3.7 2.8*     Antibiotics: Anti-infectives (From admission, onward)    Start     Dose/Rate Route Frequency Ordered Stop   07/03/23 0800  metroNIDAZOLE (FLAGYL) IVPB 500 mg        500 mg 100 mL/hr over 60 Minutes Intravenous Every 12 hours 07/03/23 0052     07/03/23 0600  cefTRIAXone (ROCEPHIN) 2 g in sodium chloride 0.9 % 100 mL IVPB        2 g 200 mL/hr over 30 Minutes Intravenous Every 24 hours 07/03/23 0052          DVT prophylaxis: Heparin  Code Status: Full code  Family Communication: Discussed with patient  wife at bedside   CONSULTS  General Surgery  Subjective   Still complains of right upper quadrant pain   Objective    Physical Examination:   General-appears in no acute distress Heart-S1-S2, regular, no murmur auscultated Lungs-clear to auscultation bilaterally, no wheezing or crackles auscultated Abdomen-soft, right upper quadrant tenderness to palpation Extremities-no edema in the lower extremities Neuro-alert, oriented x3, no focal deficit noted  Status is: Inpatient:             Meredeth Ide   [facility] If 7PM-7AM, please contact night-coverage at [email or web address], Office  [phone]   07/03/2023, 7:40 AM  LOS: 1 day

## 2023-07-03 NOTE — Progress Notes (Addendum)
Hypoglycemic Event  CBG: 69  Treatment: D50 25 mL (12.5 gm)  Symptoms: Pale  Follow-up CBG: Time:2343 CBG Result:153  Possible Reasons for Event: Other: Patient is NPO except sips with meds and ice chips   Comments/MD notified:J. Garner Nash NP   New order to change LR to 90mL and Y linked D5/LR at 10mL  Denver Faster

## 2023-07-03 NOTE — Consult Note (Signed)
[facility] Surgery Consult Note  Hunter Sparks 10-Feb-1946  [id number].    Requesting MD: Mauro Kaufmann Chief Complaint/Reason for Consult: cholecystitis   HPI:  Hunter Sparks is a 77 y.o. male with recent acute stroke 06/21/23 and left hemiparesis on plavix, who was sent to the ED from inpatient rehab yesterday for evaluation of abdominal pain. Wife at bedside who helped with history taking. States that he started complaining of some abdominal pain about 4 days ago. Initially though this was due to constipation but did not resolve with laxatives. Pain became more severe, associated with nausea and vomiting. Wife states that his cognition has been worse over the last couple days as well.  Patient was worked up by EDP and found to have acute calculus cholecystitis on u/s; no biliary ductal dilatation. WBC 22.5, up to 26.3 today. LFTs ok other than Tbili of 1.4 today. Patient was admitted to the medical service and started on IV rocephin and flagyl. General surgery asked to see.  Abdominal surgical history: none   Family History  Problem Relation Age of Onset   Stroke Mother    Hypertension Mother    Stomach cancer Father    Liver cancer Father    Cancer Father    Rectal cancer Brother    Cancer Brother    Heart attack Neg Hx     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HCC)    Arthritis    "back, hips, legs" (06/10/2015)   Chest pain    Chronic lower back pain    Depression    DJD (degenerative joint disease)    Hyperlipidemia 06/11/2015   Hypertension    Hypothyroidism    Kidney stone    Kidney stone    Type II diabetes mellitus (HCC)     Past Surgical History:  Procedure Laterality Date   CARDIAC CATHETERIZATION N/A 06/11/2015   Procedure: Left Heart Cath and Coronary Angiography;  Surgeon: Lyn Records, MD;  Location: [facility] INVASIVE CV LAB;  Service: Cardiovascular;  Laterality: N/A;   COLONOSCOPY  06/21/2011   Procedure: COLONOSCOPY;  Surgeon:  Malissa Hippo, MD;  Location: [facility] ENDO SUITE;  Service: Endoscopy;  Laterality: N/A;   COLONOSCOPY     COLONOSCOPY N/A 09/20/2017   Procedure: COLONOSCOPY;  Surgeon: Malissa Hippo, MD;  Location: [facility] ENDO SUITE;  Service: Endoscopy;  Laterality: N/A;  1200   CORONARY ARTERY BYPASS GRAFT N/A 06/14/2015   Procedure: CORONARY ARTERY BYPASS GRAFTING times four using Left Internal mammary artery and right leg Saphenous vein graft;  Surgeon: Kerin Perna, MD;  Location: [facility] OR;  Service: Open Heart Surgery;  Laterality: N/A;   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   HAND SURGERY     SKIN CANCER EXCISION Left    "forearm"   TEE WITHOUT CARDIOVERSION N/A 06/14/2015   Procedure: TRANSESOPHAGEAL ECHOCARDIOGRAM (TEE);  Surgeon: Kerin Perna, MD;  Location: [facility] OR;  Service: Open Heart Surgery;  Laterality: N/A;    Social History:  reports that he quit smoking about 19 years ago. His smoking use included cigarettes. He started smoking about 64 years ago. He has a 90 pack-year smoking history. His smokeless tobacco use includes chew. He reports that he does not drink alcohol and does not use drugs.  Allergies:  Allergies  Allergen Reactions   Penicillins Rash         Medications Prior to Admission  Medication Sig Dispense Refill   acetaminophen (TYLENOL) 325  MG tablet Take 2 tablets (650 mg total) by mouth every 4 (four) hours as needed for mild pain (or temp > 37.5 C (99.5 F)).     aspirin EC 81 MG tablet Take 1 tablet (81 mg total) by mouth daily with breakfast. -Take Aspirin 81 mg daily along with Plavix 75 mg daily for 21 days then after that STOP the Plavix  and continue ONLY Aspirin 81 mg daily indefinitely-- 30 tablet 11   atorvastatin (LIPITOR) 40 MG tablet Take 1 tablet (40 mg total) by mouth daily. 30 tablet 4   blood glucose meter kit and supplies 1 each by Other route 4 (four) times daily. Dispense based on patient and insurance preference. Use up to four times daily as  directed. (FOR ICD-10 E10.9, E11.9). 1 each 0   clopidogrel (PLAVIX) 75 MG tablet Take 1 tablet (75 mg total) by mouth daily. Take Aspirin 81 mg daily along with Plavix 75 mg daily for 21 days then after that STOP the Plavix  and continue ONLY Aspirin 81 mg daily indefinitely-- 30 tablet 0   glucose blood (ONETOUCH VERIO) test strip 1 each by Other route 4 (four) times daily. Use as instructed 150 each 5   insulin aspart (NOVOLOG) 100 UNIT/ML FlexPen Inject 0-10 Units into the skin 3 (three) times daily with meals. Short acting insulin per sliding scale 0-10 ---:-- Insulin injection 0-10 Units 0-10 Units Subcutaneous, 3 times daily with meals CBG 70 - 120: 0 unit CBG 121 - 150: 0 unit  CBG 151 - 200: 1 unit CBG 201 - 250: 2 units CBG 251 - 300: 4 units CBG 301 - 350: 6 units  CBG 351 - 400: 8 units  CBG > 400: 10 units 15 mL 11   insulin glargine, 2 Unit Dial, (TOUJEO MAX SOLOSTAR) 300 UNIT/ML Solostar Pen Inject 42 Units into the skin at bedtime. 15 mL 0   Insulin Pen Needle (B-D ULTRAFINE III SHORT PEN) 31G X 8 MM MISC 1 each by Does not apply route as directed. 100 each 3   levothyroxine (SYNTHROID) 75 MCG tablet Take 1 tablet (75 mcg total) by mouth daily before breakfast. 90 tablet 0   losartan (COZAAR) 25 MG tablet Take 1 tablet (25 mg total) by mouth daily. 90 tablet 0   metoprolol tartrate (LOPRESSOR) 25 MG tablet Take 1 tablet (25 mg total) by mouth 2 (two) times daily. 180 tablet 0   senna-docusate (SENOKOT-S) 8.6-50 MG tablet Take 2 tablets by mouth at bedtime. 60 tablet 1   traMADol (ULTRAM) 50 MG tablet Take 1 tablet (50 mg total) by mouth every 6 (six) hours as needed. pain 30 tablet 0   traZODone (DESYREL) 100 MG tablet Take 1 tablet (100 mg total) by mouth at bedtime. 30 tablet 2    Prior to Admission medications   Medication Sig Start Date End Date Taking? Authorizing Provider  acetaminophen (TYLENOL) 325 MG tablet Take 2 tablets (650 mg total) by mouth every 4 (four) hours as needed  for mild pain (or temp > 37.5 C (99.5 F)). 06/23/23   Shon Hale, MD  aspirin EC 81 MG tablet Take 1 tablet (81 mg total) by mouth daily with breakfast. -Take Aspirin 81 mg daily along with Plavix 75 mg daily for 21 days then after that STOP the Plavix  and continue ONLY Aspirin 81 mg daily indefinitely-- 06/23/23   Shon Hale, MD  atorvastatin (LIPITOR) 40 MG tablet Take 1 tablet (40 mg total) by mouth daily.  06/24/23   Shon Hale, MD  blood glucose meter kit and supplies 1 each by Other route 4 (four) times daily. Dispense based on patient and insurance preference. Use up to four times daily as directed. (FOR ICD-10 E10.9, E11.9). 01/16/20   Roma Kayser, MD  clopidogrel (PLAVIX) 75 MG tablet Take 1 tablet (75 mg total) by mouth daily. Take Aspirin 81 mg daily along with Plavix 75 mg daily for 21 days then after that STOP the Plavix  and continue ONLY Aspirin 81 mg daily indefinitely-- 06/24/23   Shon Hale, MD  glucose blood (ONETOUCH VERIO) test strip 1 each by Other route 4 (four) times daily. Use as instructed 10/27/19   Roma Kayser, MD  insulin aspart (NOVOLOG) 100 UNIT/ML FlexPen Inject 0-10 Units into the skin 3 (three) times daily with meals. Short acting insulin per sliding scale 0-10 ---:-- Insulin injection 0-10 Units 0-10 Units Subcutaneous, 3 times daily with meals CBG 70 - 120: 0 unit CBG 121 - 150: 0 unit  CBG 151 - 200: 1 unit CBG 201 - 250: 2 units CBG 251 - 300: 4 units CBG 301 - 350: 6 units  CBG 351 - 400: 8 units  CBG > 400: 10 units 06/23/23   Emokpae, Courage, MD  insulin glargine, 2 Unit Dial, (TOUJEO MAX SOLOSTAR) 300 UNIT/ML Solostar Pen Inject 42 Units into the skin at bedtime. 06/23/23   Emokpae, Courage, MD  Insulin Pen Needle (B-D ULTRAFINE III SHORT PEN) 31G X 8 MM MISC 1 each by Does not apply route as directed. 10/27/19   Roma Kayser, MD  levothyroxine (SYNTHROID) 75 MCG tablet Take 1 tablet (75 mcg total) by mouth daily before  breakfast. 06/23/23   Shon Hale, MD  losartan (COZAAR) 25 MG tablet Take 1 tablet (25 mg total) by mouth daily. 06/19/23   Antoine Poche, MD  metoprolol tartrate (LOPRESSOR) 25 MG tablet Take 1 tablet (25 mg total) by mouth 2 (two) times daily. 06/19/23   Antoine Poche, MD  senna-docusate (SENOKOT-S) 8.6-50 MG tablet Take 2 tablets by mouth at bedtime. 06/23/23   Shon Hale, MD  traMADol (ULTRAM) 50 MG tablet Take 1 tablet (50 mg total) by mouth every 6 (six) hours as needed. pain 06/19/15   Barrett, Erin R, PA-C  traZODone (DESYREL) 100 MG tablet Take 1 tablet (100 mg total) by mouth at bedtime. 06/23/23   Shon Hale, MD    Blood pressure (!) 153/81, pulse 96, temperature 99.7 F (37.6 C), temperature source Oral, resp. rate 15, SpO2 97%. Physical Exam: General: resting, NAD HEENT: head is normocephalic, atraumatic.  Sclera are noninjected.  Pupils equal and round.  Ears and nose without any masses or lesions.  Mouth is pink and moist. Dentition fair Heart: regular, rate, and rhythm Lungs: CTAB, no wheezes, rhonchi, or rales noted.  Respiratory effort nonlabored on room air Abd: soft, ND, +BS, no masses, hernias, or organomegaly. TTP RUQ with some voluntary guarding Skin: warm and dry with no masses, lesions, or rashes  Results for orders placed or performed during the hospital encounter of 07/02/23 (from the past 48 hour(s))  CBC with Differential/Platelet     Status: Abnormal   Collection Time: 07/03/23  2:49 AM  Result Value Ref Range   WBC 26.3 (H) 4.0 - 10.5 K/uL   RBC 4.66 4.22 - 5.81 MIL/uL   Hemoglobin 14.4 13.0 - 17.0 g/dL   HCT 03.4 74.2 - 59.5 %   MCV 89.3 80.0 -  100.0 fL   MCH 30.9 26.0 - 34.0 pg   MCHC 34.6 30.0 - 36.0 g/dL   RDW 16.1 09.6 - 04.5 %   Platelets 227 150 - 400 K/uL   nRBC 0.0 0.0 - 0.2 %   Neutrophils Relative % 83 %   Neutro Abs 21.8 (H) 1.7 - 7.7 K/uL   Lymphocytes Relative 6 %   Lymphs Abs 1.5 0.7 - 4.0 K/uL   Monocytes Relative  10 %   Monocytes Absolute 2.7 (H) 0.1 - 1.0 K/uL   Eosinophils Relative 0 %   Eosinophils Absolute 0.0 0.0 - 0.5 K/uL   Basophils Relative 0 %   Basophils Absolute 0.0 0.0 - 0.1 K/uL   Immature Granulocytes 1 %   Abs Immature Granulocytes 0.18 (H) 0.00 - 0.07 K/uL    Comment: Performed at [facility], [address] [place], [place] [address]  Comprehensive metabolic panel     Status: Abnormal   Collection Time: 07/03/23  2:49 AM  Result Value Ref Range   Sodium 133 (L) 135 - 145 mmol/L   Potassium 4.7 3.5 - 5.1 mmol/L    Comment: HEMOLYSIS AT THIS LEVEL MAY AFFECT RESULT   Chloride 100 98 - 111 mmol/L   CO2 26 22 - 32 mmol/L   Glucose, Bld 180 (H) 70 - 99 mg/dL    Comment: Glucose reference range applies only to samples taken after fasting for at least 8 hours.   BUN 22 8 - 23 mg/dL   Creatinine, Ser 1.91 (H) 0.61 - 1.24 mg/dL   Calcium 8.3 (L) 8.9 - 10.3 mg/dL   Total Protein 6.2 (L) 6.5 - 8.1 g/dL   Albumin 2.8 (L) 3.5 - 5.0 g/dL   AST 20 15 - 41 U/L    Comment: HEMOLYSIS AT THIS LEVEL MAY AFFECT RESULT   ALT 16 0 - 44 U/L    Comment: HEMOLYSIS AT THIS LEVEL MAY AFFECT RESULT   Alkaline Phosphatase 65 38 - 126 U/L   Total Bilirubin 1.4 (H) 0.3 - 1.2 mg/dL    Comment: HEMOLYSIS AT THIS LEVEL MAY AFFECT RESULT   GFR, Estimated 56 (L) >60 mL/min    Comment: (NOTE) Calculated using the CKD-EPI Creatinine Equation (2021)    Anion gap 7 5 - 15    Comment: Performed at [facility], [address] [place], [place] [address]  Magnesium     Status: None   Collection Time: 07/03/23  2:49 AM  Result Value Ref Range   Magnesium 1.9 1.7 - 2.4 mg/dL    Comment: Performed at [facility], [address] [place], [place] [address]  Glucose, capillary     Status: Abnormal   Collection Time: 07/03/23  3:14 AM  Result Value Ref Range   Glucose-Capillary 183 (H) 70 - 99 mg/dL    Comment: Glucose reference range applies only to samples taken after fasting for at  least 8 hours.  Glucose, capillary     Status: Abnormal   Collection Time: 07/03/23  7:39 AM  Result Value Ref Range   Glucose-Capillary 139 (H) 70 - 99 mg/dL    Comment: Glucose reference range applies only to samples taken after fasting for at least 8 hours.   US Abdomen Limited RUQ (LIVER/GB)  Addendum Date: 07/03/2023   ADDENDUM REPORT: 07/03/2023 00:54 ADDENDUM: Critical Value/emergent results were called by telephone at the time of interpretation on 07/03/2023 at 12:52 am to provider DR. CHEN, who verbally acknowledged  these results. Electronically Signed   By: Almira Bar M.D.   On: 07/03/2023 00:54   Result Date: 07/03/2023 CLINICAL DATA:  [id number].  Right upper quadrant abdominal pain. EXAM: ULTRASOUND ABDOMEN LIMITED RIGHT UPPER QUADRANT COMPARISON:  CT without contrast 12/03/2021 FINDINGS: Gallbladder: There is a hydropic dilated gallbladder measuring 13 cm in length. There is thickening of the free wall to 4 mm, positive sonographic Murphy's sign, gallbladder sludge, and layering stones up to 9 mm. Trace pericholecystic fluid is also noted. Findings consistent with acute cholecystitis. Common bile duct: Diameter: 5.8 mm.  No intrahepatic biliary prominence. Liver: No focal lesion identified. There is mild increased parenchymal echogenicity consistent with fatty replacement. Portal vein is patent on color Doppler imaging with normal direction of blood flow towards the liver. Other: None. IMPRESSION: 1. Cholelithiasis, and additional findings consistent with acute cholecystitis. 2. No biliary ductal dilatation. 3. Mild fatty replacement of the liver. Electronically Signed: By: Almira Bar M.D. On: 07/03/2023 00:41   DG Chest 2 View  Result Date: 07/02/2023 CLINICAL DATA:  Leukocytosis. EXAM: CHEST - 2 VIEW COMPARISON:  None Available. FINDINGS: The heart size and mediastinal contours are within normal limits. Both lungs are clear. The visualized skeletal structures are unremarkable.  IMPRESSION: No active cardiopulmonary disease. Electronically Signed   By: Marin Roberts M.D.   On: 07/02/2023 18:06   MR BRAIN WO CONTRAST  Result Date: 07/02/2023 CLINICAL DATA:  Mental status change, unknown cause. EXAM: MRI HEAD WITHOUT CONTRAST TECHNIQUE: Multiplanar, multiecho pulse sequences of the brain and surrounding structures were obtained without intravenous contrast. COMPARISON:  Head MRI and MRA 06/21/2023 FINDINGS: The study is intermittently moderately motion degraded. Brain: Small acute to early subacute right cerebral hemispheric infarcts described on the prior MRI have evolved in the interim. Mildly restricted diffusion remains at the site of the posterior right basal ganglia infarct. No new infarct, mass, midline shift, or extra-axial fluid collection is identified. Patchy T2 hyperintensities in the cerebral white matter bilaterally are unchanged and nonspecific but compatible with moderate chronic small vessel ischemic disease. Chronic infarcts are again noted in the right basal ganglia, left corona radiata, pons, and both cerebellar hemispheres a chronic microhemorrhage in the right occipital lobe is unchanged. There is moderate cerebral atrophy. Vascular: Abnormal appearance of the distal left vertebral artery, more fully evaluated on the recent MRA. Skull and upper cervical spine: No suspicious marrow lesion. Sinuses/Orbits: Unremarkable orbits. Paranasal sinuses and mastoid air cells are clear. Other: None. IMPRESSION: 1. Interval evolution of the previously described recent infarcts. 2. No new infarct or other acute intracranial abnormality. 3. Moderate chronic small vessel ischemic disease with multiple chronic infarcts as above. Electronically Signed   By: Sebastian Ache M.D.   On: 07/02/2023 15:35      Assessment/Plan Acute calculous cholecystitis  - Patient with clinical and radiographic findings consistent with acute cholecystitis. Given his recent stroke he would be  high risk at this time for general anesthesia and abdominal surgery. He also remains on plavix and would be at higher risk of bleeding. Recommend IR consult for percutaneous cholecystostomy tube placement. Will switch his antibiotics to IV cipro and flagyl. We will plan to follow up with this patient as an outpatient to discuss timing of possible cholecystectomy in the future.   ID - cipro/flagyl VTE - SCDs, sq heparin, plavix FEN - IVF, NPO Foley - none  Recent stroke 06/21/23 with left hemiparesis on plavix CKD DM CAD, hx CABG HTN Hypothyroidism  I reviewed ED  provider notes, last 24 h vitals and pain scores, last 48 h intake and output, last 24 h labs and trends, and last 24 h imaging results.   Franne Forts, [facility] Surgery 07/03/2023, 9:18 AM Please see Amion for pager number during day hours 7:00am-4:30pm

## 2023-07-03 NOTE — Procedures (Signed)
Interventional Radiology Procedure Note  Procedure: Image guided drain placement, perc chole.  23F pigtail drain.  Complications: None  EBL: None Sample: Culture sent  Recommendations: - Routine drain care, with sterile flushes, record output - follow up Cx - routine wound care - PRN for pain  Signed,  Yvone Neu. Loreta Ave, DO, ABVM, RPVI

## 2023-07-03 NOTE — Consult Note (Addendum)
Chief Complaint: Patient was seen in consultation today for percutaneous cholecystostomy drain placement at the request of Engler,Morgan C  Referring Physician(s): Cherrie Gauze Meuth [facility]  Supervising Physician: Gilmer Mor  Patient Status: [facility] - [facility]  History of Present Illness: Hunter Sparks is a 77 y.o. male   FULL Code status per chart and pts wife DM; Hypothyroid; HTN; CAD; CKD Hx recent CVA--- discharged just 8/10 Readmitted yesterday secondary abd pain; N/V Fever Leukocytosis On ASA/Plavix and Hep inj  [place] yesterday:  IMPRESSION: 1. Cholelithiasis, and additional findings consistent with acute cholecystitis. 2. No biliary ductal dilatation. 3. Mild fatty replacement of the liver.  CCS has seen pt:  Acute calculous cholecystitis  - Patient with clinical and radiographic findings consistent with acute cholecystitis. Given his recent stroke he would be high risk at this time for general anesthesia and abdominal surgery. He also remains on plavix and would be at higher risk of bleeding. Recommend IR consult for percutaneous cholecystostomy tube placement. Will switch his antibiotics to IV cipro and flagyl. We will plan to follow up with this patient as an outpatient to discuss timing of possible cholecystectomy in the future.  Request for percutaneous cholecystostomy in IR Imaging reviewed with Dr Loreta Ave-- approves procedure Anticoagulation will cause risk of bleeding to be higher--- this was discussed with wife at bedside   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HCC)    Arthritis    "back, hips, legs" (06/10/2015)   Chest pain    Chronic lower back pain    Depression    DJD (degenerative joint disease)    Hyperlipidemia 06/11/2015   Hypertension    Hypothyroidism    Kidney stone    Kidney stone    Type II diabetes mellitus (HCC)     Past Surgical History:  Procedure Laterality Date   CARDIAC CATHETERIZATION N/A  06/11/2015   Procedure: Left Heart Cath and Coronary Angiography;  Surgeon: Lyn Records, MD;  Location: [facility] INVASIVE CV LAB;  Service: Cardiovascular;  Laterality: N/A;   COLONOSCOPY  06/21/2011   Procedure: COLONOSCOPY;  Surgeon: Malissa Hippo, MD;  Location: [facility];  Service: Endoscopy;  Laterality: N/A;   COLONOSCOPY     COLONOSCOPY N/A 09/20/2017   Procedure: COLONOSCOPY;  Surgeon: Malissa Hippo, MD;  Location: [facility] SUITE;  Service: Endoscopy;  Laterality: N/A;  1200   CORONARY ARTERY BYPASS GRAFT N/A 06/14/2015   Procedure: CORONARY ARTERY BYPASS GRAFTING times four using Left Internal mammary artery and right leg Saphenous vein graft;  Surgeon: Kerin Perna, MD;  Location: [facility] OR;  Service: Open Heart Surgery;  Laterality: N/A;   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   HAND SURGERY     SKIN CANCER EXCISION Left    "forearm"   TEE WITHOUT CARDIOVERSION N/A 06/14/2015   Procedure: TRANSESOPHAGEAL ECHOCARDIOGRAM (TEE);  Surgeon: Kerin Perna, MD;  Location: [facility] OR;  Service: Open Heart Surgery;  Laterality: N/A;    Allergies: Penicillins  Medications: Prior to Admission medications   Medication Sig Start Date End Date Taking? Authorizing Provider  acetaminophen (TYLENOL) 325 MG tablet Take 2 tablets (650 mg total) by mouth every 4 (four) hours as needed for mild pain (or temp > 37.5 C (99.5 F)). 06/23/23   Shon Hale, MD  aspirin EC 81 MG tablet Take 1 tablet (81 mg total) by mouth daily with breakfast. -Take Aspirin 81 mg daily along with Plavix 75 mg daily for  21 days then after that STOP the Plavix  and continue ONLY Aspirin 81 mg daily indefinitely-- 06/23/23   Shon Hale, MD  atorvastatin (LIPITOR) 40 MG tablet Take 1 tablet (40 mg total) by mouth daily. 06/24/23   Shon Hale, MD  blood glucose meter kit and supplies 1 each by Other route 4 (four) times daily. Dispense based on patient and insurance preference. Use up to four times daily as  directed. (FOR ICD-10 E10.9, E11.9). 01/16/20   Roma Kayser, MD  clopidogrel (PLAVIX) 75 MG tablet Take 1 tablet (75 mg total) by mouth daily. Take Aspirin 81 mg daily along with Plavix 75 mg daily for 21 days then after that STOP the Plavix  and continue ONLY Aspirin 81 mg daily indefinitely-- 06/24/23   Shon Hale, MD  glucose blood (ONETOUCH VERIO) test strip 1 each by Other route 4 (four) times daily. Use as instructed 10/27/19   Roma Kayser, MD  insulin aspart (NOVOLOG) 100 UNIT/ML FlexPen Inject 0-10 Units into the skin 3 (three) times daily with meals. Short acting insulin per sliding scale 0-10 ---:-- Insulin injection 0-10 Units 0-10 Units Subcutaneous, 3 times daily with meals CBG 70 - 120: 0 unit CBG 121 - 150: 0 unit  CBG 151 - 200: 1 unit CBG 201 - 250: 2 units CBG 251 - 300: 4 units CBG 301 - 350: 6 units  CBG 351 - 400: 8 units  CBG > 400: 10 units 06/23/23   Emokpae, Courage, MD  insulin glargine, 2 Unit Dial, (TOUJEO MAX SOLOSTAR) 300 UNIT/ML Solostar Pen Inject 42 Units into the skin at bedtime. 06/23/23   Emokpae, Courage, MD  Insulin Pen Needle (B-D ULTRAFINE III SHORT PEN) 31G X 8 MM MISC 1 each by Does not apply route as directed. 10/27/19   Roma Kayser, MD  levothyroxine (SYNTHROID) 75 MCG tablet Take 1 tablet (75 mcg total) by mouth daily before breakfast. 06/23/23   Shon Hale, MD  losartan (COZAAR) 25 MG tablet Take 1 tablet (25 mg total) by mouth daily. 06/19/23   Antoine Poche, MD  metoprolol tartrate (LOPRESSOR) 25 MG tablet Take 1 tablet (25 mg total) by mouth 2 (two) times daily. 06/19/23   Antoine Poche, MD  senna-docusate (SENOKOT-S) 8.6-50 MG tablet Take 2 tablets by mouth at bedtime. 06/23/23   Shon Hale, MD  traMADol (ULTRAM) 50 MG tablet Take 1 tablet (50 mg total) by mouth every 6 (six) hours as needed. pain 06/19/15   Barrett, Erin R, PA-C  traZODone (DESYREL) 100 MG tablet Take 1 tablet (100 mg total) by mouth at  bedtime. 06/23/23   Shon Hale, MD     Family History  Problem Relation Age of Onset   Stroke Mother    Hypertension Mother    Stomach cancer Father    Liver cancer Father    Cancer Father    Rectal cancer Brother    Cancer Brother    Heart attack Neg Hx     Social History   Socioeconomic History   Marital status: Married    Spouse name: Not on file   Number of children: Not on file   Years of education: Not on file   Highest education level: Not on file  Occupational History   Not on file  Tobacco Use   Smoking status: Former    Current packs/day: 0.00    Average packs/day: 2.0 packs/day for 45.0 years (90.0 ttl pk-yrs)    Types: Cigarettes  Start date: 82    Quit date: 2005    Years since quitting: 19.6   Smokeless tobacco: Current    Types: Chew  Vaping Use   Vaping status: Never Used  Substance and Sexual Activity   Alcohol use: No    Alcohol/week: 0.0 standard drinks of alcohol   Drug use: No   Sexual activity: Never    Partners: Female  Other Topics Concern   Not on file  Social History Narrative   Not on file   Social Determinants of Health   Financial Resource Strain: Not on file  Food Insecurity: Not on file  Transportation Needs: Not on file  Physical Activity: Not on file  Stress: Not on file  Social Connections: Not on file    Review of Systems: A 12 point ROS discussed and pertinent positives are indicated in the HPI above.  All other systems are negative.  Review of Systems  Constitutional:  Positive for activity change and fever.  Respiratory:  Negative for cough and shortness of breath.   Gastrointestinal:  Positive for abdominal pain and nausea.  Psychiatric/Behavioral:  Negative for behavioral problems.     Vital Signs: BP (!) 153/81 (BP Location: Right Arm)   Pulse 96   Temp 99.7 F (37.6 C) (Oral)   Resp 15   SpO2 97%   Advance Care Plan: The advanced care plan/surrogate decision maker was discussed at the time of  visit and documented in the medical record.    Physical Exam Vitals reviewed.  HENT:     Mouth/Throat:     Mouth: Mucous membranes are moist.  Cardiovascular:     Rate and Rhythm: Normal rate and regular rhythm.     Heart sounds: Normal heart sounds.  Pulmonary:     Breath sounds: Normal breath sounds.  Abdominal:     Palpations: Abdomen is soft.     Tenderness: There is abdominal tenderness.  Musculoskeletal:        General: Normal range of motion.  Skin:    General: Skin is warm.  Neurological:     Mental Status: He is alert.     Comments: Pt asleep and groggy Wife at bedside  Psychiatric:     Comments: Wife has consented to procedure     Imaging: US Abdomen Limited RUQ (LIVER/GB)  Addendum Date: 07/03/2023   ADDENDUM REPORT: 07/03/2023 00:54 ADDENDUM: Critical Value/emergent results were called by telephone at the time of interpretation on 07/03/2023 at 12:52 am to provider DR. CHEN, who verbally acknowledged these results. Electronically Signed   By: Almira Bar M.D.   On: 07/03/2023 00:54   Result Date: 07/03/2023 CLINICAL DATA:  [id number].  Right upper quadrant abdominal pain. EXAM: ULTRASOUND ABDOMEN LIMITED RIGHT UPPER QUADRANT COMPARISON:  CT without contrast 12/03/2021 FINDINGS: Gallbladder: There is a hydropic dilated gallbladder measuring 13 cm in length. There is thickening of the free wall to 4 mm, positive sonographic Murphy's sign, gallbladder sludge, and layering stones up to 9 mm. Trace pericholecystic fluid is also noted. Findings consistent with acute cholecystitis. Common bile duct: Diameter: 5.8 mm.  No intrahepatic biliary prominence. Liver: No focal lesion identified. There is mild increased parenchymal echogenicity consistent with fatty replacement. Portal vein is patent on color Doppler imaging with normal direction of blood flow towards the liver. Other: None. IMPRESSION: 1. Cholelithiasis, and additional findings consistent with acute cholecystitis. 2. No  biliary ductal dilatation. 3. Mild fatty replacement of the liver. Electronically Signed: By: Almira Bar M.D. On:  07/03/2023 00:41   DG Chest 2 View  Result Date: 07/02/2023 CLINICAL DATA:  Leukocytosis. EXAM: CHEST - 2 VIEW COMPARISON:  None Available. FINDINGS: The heart size and mediastinal contours are within normal limits. Both lungs are clear. The visualized skeletal structures are unremarkable. IMPRESSION: No active cardiopulmonary disease. Electronically Signed   By: Marin Roberts M.D.   On: 07/02/2023 18:06   MR BRAIN WO CONTRAST  Result Date: 07/02/2023 CLINICAL DATA:  Mental status change, unknown cause. EXAM: MRI HEAD WITHOUT CONTRAST TECHNIQUE: Multiplanar, multiecho pulse sequences of the brain and surrounding structures were obtained without intravenous contrast. COMPARISON:  Head MRI and MRA 06/21/2023 FINDINGS: The study is intermittently moderately motion degraded. Brain: Small acute to early subacute right cerebral hemispheric infarcts described on the prior MRI have evolved in the interim. Mildly restricted diffusion remains at the site of the posterior right basal ganglia infarct. No new infarct, mass, midline shift, or extra-axial fluid collection is identified. Patchy T2 hyperintensities in the cerebral white matter bilaterally are unchanged and nonspecific but compatible with moderate chronic small vessel ischemic disease. Chronic infarcts are again noted in the right basal ganglia, left corona radiata, pons, and both cerebellar hemispheres a chronic microhemorrhage in the right occipital lobe is unchanged. There is moderate cerebral atrophy. Vascular: Abnormal appearance of the distal left vertebral artery, more fully evaluated on the recent MRA. Skull and upper cervical spine: No suspicious marrow lesion. Sinuses/Orbits: Unremarkable orbits. Paranasal sinuses and mastoid air cells are clear. Other: None. IMPRESSION: 1. Interval evolution of the previously described recent  infarcts. 2. No new infarct or other acute intracranial abnormality. 3. Moderate chronic small vessel ischemic disease with multiple chronic infarcts as above. Electronically Signed   By: Sebastian Ache M.D.   On: 07/02/2023 15:35   DG Abd 1 View  Result Date: 06/29/2023 CLINICAL DATA:  161096 Abdominal discomfort [id number] EXAM: ABDOMEN - 1 VIEW COMPARISON:  06/25/2023 abdominal radiograph FINDINGS: Surgical clips overlie the medial high left abdomen and medial lower right abdomen and right groin. No dilated small bowel loops. Mild colonic gas and stool. No evidence of pneumatosis or pneumoperitoneum. Moderate lumbar spondylosis. No radiopaque nephrolithiasis. IMPRESSION: Nonobstructive bowel gas pattern. Electronically Signed   By: Delbert Phenix M.D.   On: 06/29/2023 19:26   DG Chest 2 View  Result Date: 06/29/2023 CLINICAL DATA:  Dyspnea EXAM: CHEST - 2 VIEW COMPARISON:  05/24/2016 chest radiograph. FINDINGS: Intact sternotomy wires. Stable cardiomediastinal silhouette with normal heart size. No pneumothorax. No pleural effusion. Mild eventration of the posterior left hemidiaphragm. No pulmonary edema. No acute consolidative airspace disease. IMPRESSION: No active cardiopulmonary disease. Mild eventration of the posterior left hemidiaphragm. Electronically Signed   By: Delbert Phenix M.D.   On: 06/29/2023 19:25   DG Abd 1 View  Result Date: 06/25/2023 CLINICAL DATA:  Constipation EXAM: ABDOMEN - 1 VIEW COMPARISON:  None Available. FINDINGS: Bowel gas pattern is nonspecific. Moderate amount of stool is seen in colon and rectum. There is no fecal impaction in rectum. No abnormal masses are seen. Calcifications in left side of pelvis may be vascular. Kidneys are partly obscured by bowel contents limiting evaluation for small renal stones. In 1 of the views, there are 2 small linear radiopacities in right lower quadrant of abdomen. This finding was not distinctly seen in CT abdomen and pelvis done on 12/03/2021.  Degenerative changes are noted in lumbar spine, more severe at L1-L2 level. IMPRESSION: Nonspecific bowel gas pattern. Moderate amount of stool is seen in  colon. No abnormal masses or calcifications are noted. There are 2 small thin linear metallic densities in right lower quadrant of abdomen which may suggest artifacts or foreign bodies. If clinically warranted, follow-up CT may be considered. Electronically Signed   By: Ernie Avena M.D.   On: 06/25/2023 18:34   ECHOCARDIOGRAM COMPLETE BUBBLE STUDY  Result Date: 06/22/2023    ECHOCARDIOGRAM REPORT   Patient Name:   Hunter Sparks Date of Exam: 06/22/2023 Medical Rec #:  [id number]        Height:       72.0 in Accession #:    [id number]       Weight:       216.0 lb Date of Birth:  1946-11-09        BSA:          2.201 m Patient Age:    76 years         BP:           163/80 mmHg Patient Gender: M                HR:           86 bpm. [facility] Procedure: 2D Echo, Cardiac Doppler, Color Doppler and Saline Contrast Bubble            Study Indications:    Stroke 434.91 / I63.9  History:        Patient has prior history of Echocardiogram examinations, most                 recent 06/12/2015. CAD, Signs/Symptoms:Altered Mental Status;                 Risk Factors:Hypertension, Diabetes, Former Smoker and                 Dyslipidemia.  Sonographer:    Aron Baba Referring Phys: [id number] [facility]  Sonographer Comments: Suboptimal parasternal window and suboptimal subcostal window. Image acquisition challenging due to uncooperative patient and Image acquisition challenging due to respiratory motion. IMPRESSIONS  1. Left ventricular ejection fraction, by estimation, is 70 to 75%. The left ventricle has hyperdynamic function. The left ventricle has no regional wall motion abnormalities. Left ventricular diastolic parameters are consistent with Grade I diastolic dysfunction (impaired relaxation).  2. Right ventricular systolic function was not  well visualized. The right ventricular size is normal.  3. The mitral valve is grossly normal. No evidence of mitral valve regurgitation. No evidence of mitral stenosis.  4. The aortic valve was not well visualized. Aortic valve regurgitation is not visualized. Aortic valve sclerosis/calcification is present, without any evidence of aortic stenosis.  5. Agitated saline contrast bubble study was negative, with no evidence of any interatrial shunt. Comparison(s): No prior Echocardiogram. FINDINGS  Left Ventricle: Left ventricular ejection fraction, by estimation, is 70 to 75%. The left ventricle has hyperdynamic function. The left ventricle has no regional wall motion abnormalities. The left ventricular internal cavity size was normal in size. There is no left ventricular hypertrophy. Left ventricular diastolic parameters are consistent with Grade I diastolic dysfunction (impaired relaxation). Right Ventricle: The right ventricular size is normal. No increase in right ventricular wall thickness. Right ventricular systolic function was not well visualized. Left Atrium: Left atrial size was normal in size. Right Atrium: Right atrial size was normal in size. Pericardium: There is no evidence of pericardial effusion. Mitral Valve: The mitral valve is grossly normal. There is mild thickening of the mitral  valve leaflet(s). There is moderate calcification of the mitral valve leaflet(s). No evidence of mitral valve regurgitation. No evidence of mitral valve stenosis. Tricuspid Valve: The tricuspid valve is grossly normal. Tricuspid valve regurgitation is trivial. No evidence of tricuspid stenosis. Aortic Valve: The aortic valve was not well visualized. Aortic valve regurgitation is not visualized. Aortic valve sclerosis/calcification is present, without any evidence of aortic stenosis. Pulmonic Valve: The pulmonic valve was grossly normal. Pulmonic valve regurgitation is not visualized. No evidence of pulmonic stenosis.  Aorta: The aortic root and ascending aorta are structurally normal, with no evidence of dilitation. Venous: The inferior vena cava was not well visualized. IAS/Shunts: The interatrial septum was not well visualized. Agitated saline contrast was given intravenously to evaluate for intracardiac shunting. Agitated saline contrast bubble study was negative, with no evidence of any interatrial shunt.  LEFT VENTRICLE PLAX 2D LVIDd:         4.50 cm   Diastology LVIDs:         2.60 cm   LV e' medial:    6.42 cm/s LV PW:         1.00 cm   LV E/e' medial:  12.9 LV IVS:        0.50 cm   LV e' lateral:   7.40 cm/s LVOT diam:     1.70 cm   LV E/e' lateral: 11.2 LV SV:         40 LV SV Index:   18 LVOT Area:     2.27 cm  RIGHT VENTRICLE RV S prime:     9.57 cm/s TAPSE (M-mode): 0.8 cm LEFT ATRIUM             Index        RIGHT ATRIUM           Index LA Vol (A2C):   34.6 ml 15.72 ml/m  RA Area:     10.90 cm LA Vol (A4C):   48.6 ml 22.08 ml/m  RA Volume:   19.20 ml  8.72 ml/m LA Biplane Vol: 41.0 ml 18.62 ml/m  AORTIC VALVE LVOT Vmax:   94.60 cm/s LVOT Vmean:  62.900 cm/s LVOT VTI:    0.176 m  AORTA Ao Root diam: 3.40 cm Ao Asc diam:  3.30 cm MITRAL VALVE                TRICUSPID VALVE MV Area (PHT): 3.30 cm     TR Peak grad:   7.5 mmHg MV Decel Time: 230 msec     TR Vmax:        137.00 cm/s MR Peak grad: 2.8 mmHg MR Vmax:      83.80 cm/s    SHUNTS MV E velocity: 82.60 cm/s   Systemic VTI:  0.18 m MV A velocity: 120.00 cm/s  Systemic Diam: 1.70 cm MV E/A ratio:  0.69 Vishnu Priya Mallipeddi Electronically signed by Winfield Rast Mallipeddi Signature Date/Time: 06/22/2023/4:43:03 PM    Final    MR BRAIN WO CONTRAST  Result Date: 06/21/2023 CLINICAL DATA:  Stroke suspected EXAM: MRI HEAD WITHOUT CONTRAST MRA HEAD WITHOUT CONTRAST TECHNIQUE: Multiplanar, multi-echo pulse sequences of the brain and surrounding structures were acquired without intravenous contrast. Angiographic images of the Circle of Willis were acquired using  MRA technique without intravenous contrast. COMPARISON:  Same-day CT head FINDINGS: MRI HEAD FINDINGS Brain: There are small acute infarcts in the right lentiform nucleus and caudate tail, a small acute cortical infarct in the right occipital lobe, and punctate  acute cortical infarcts in the frontal and parietal lobes. There is no hemorrhage or mass effect. There is no acute intracranial hemorrhage or extra-axial fluid collection There is background parenchymal volume loss with prominence of the ventricular system and extra-axial CSF spaces. Patchy FLAIR signal abnormality in the supratentorial white matter likely reflects sequela of moderate underlying chronic small-vessel ischemic change. There are additional remote lacunar infarcts in the right basal ganglia, pons, left corona radiata and bilateral cerebellar hemispheres. The pituitary and suprasellar region are normal. There is no mass lesion. There is no mass effect or midline shift. Vascular: See below. Skull and upper cervical spine: Normal marrow signal. Sinuses/Orbits: The paranasal sinuses are clear. The globes and orbits are unremarkable. Other: The mastoid air cells and middle ear cavities are clear. MRA HEAD FINDINGS Anterior circulation: There is mild atherosclerotic irregularity of the intracranial ICAs without high-grade stenosis or occlusion. The bilateral MCAs are patent. There is focal moderate stenosis of the origin of the inferior M2 branch (1009-5). The bilateral ACAS are patent, without proximal high-grade stenosis or occlusion. There is no aneurysm or AVM. Posterior circulation: The left V4 segment is occluded, presumed chronic. The right V4 segment is patent. There is multifocal atherosclerotic irregularity of the basilar artery with moderate stenosis proximally. The PCAs are primarily supplied by prominent posterior communicating arteries bilaterally (fetal origins). There is moderate stenosis of the proximal left P2 segment (12-94), and  moderate stenosis of the distal right posterior communicating artery. There is no aneurysm or AVM. Anatomic variants: As above. IMPRESSION: 1. 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 2. Multiple additional small remote infarcts and background chronic small-vessel ischemic change as above. 3. 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 Electronically Signed   By: Lesia Hausen M.D.   On: 06/21/2023 15:11   MR ANGIO HEAD WO CONTRAST  Result Date: 06/21/2023 CLINICAL DATA:  Stroke suspected EXAM: MRI HEAD WITHOUT CONTRAST MRA HEAD WITHOUT CONTRAST TECHNIQUE: Multiplanar, multi-echo pulse sequences of the brain and surrounding structures were acquired without intravenous contrast. Angiographic images of the Circle of Willis were acquired using MRA technique without intravenous contrast. COMPARISON:  Same-day CT head FINDINGS: MRI HEAD FINDINGS Brain: There are small acute infarcts in the right lentiform nucleus and caudate tail, a small acute cortical infarct in the right occipital lobe, and punctate acute cortical infarcts in the frontal and parietal lobes. There is no hemorrhage or mass effect. There is no acute intracranial hemorrhage or extra-axial fluid collection There is background parenchymal volume loss with prominence of the ventricular system and extra-axial CSF spaces. Patchy FLAIR signal abnormality in the supratentorial white matter likely reflects sequela of moderate underlying chronic small-vessel ischemic change. There are additional remote lacunar infarcts in the right basal ganglia, pons, left corona radiata and bilateral  cerebellar hemispheres. The pituitary and suprasellar region are normal. There is no mass lesion. There is no mass effect or midline shift. Vascular: See below. Skull and upper cervical spine: Normal marrow signal. Sinuses/Orbits: The paranasal sinuses are clear. The globes and orbits are unremarkable. Other: The mastoid air cells and middle ear cavities are clear. MRA HEAD FINDINGS Anterior circulation: There is mild atherosclerotic irregularity of the intracranial ICAs without  high-grade stenosis or occlusion. The bilateral MCAs are patent. There is focal moderate stenosis of the origin of the inferior M2 branch (1009-5). The bilateral ACAS are patent, without proximal high-grade stenosis or occlusion. There is no aneurysm or AVM. Posterior circulation: The left V4 segment is occluded, presumed chronic. The right V4 segment is patent. There is multifocal atherosclerotic irregularity of the basilar artery with moderate stenosis proximally. The PCAs are primarily supplied by prominent posterior communicating arteries bilaterally (fetal origins). There is moderate stenosis of the proximal left P2 segment (12-94), and moderate stenosis of the distal right posterior communicating artery. There is no aneurysm or AVM. Anatomic variants: As above. IMPRESSION: 1. 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 2. Multiple additional small remote infarcts and background chronic small-vessel ischemic change as above. 3. 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 Electronically Signed   By: Lesia Hausen M.D.   On: 06/21/2023 15:11   US Carotid  Bilateral  Result Date: 06/21/2023 CLINICAL DATA:  Left-sided weakness and history of hypertension, hyperlipidemia, diabetes, coronary artery disease and prior smoking history. EXAM: BILATERAL CAROTID DUPLEX ULTRASOUND TECHNIQUE: Wallace Cullens scale imaging, color Doppler and duplex ultrasound were performed of bilateral carotid and vertebral arteries in the neck. COMPARISON:  None Available. FINDINGS: Criteria: Quantification of carotid stenosis is based on velocity parameters that correlate the residual internal carotid diameter with NASCET-based stenosis levels, using the diameter of the distal internal carotid lumen as the denominator for stenosis measurement. The following velocity measurements were obtained: RIGHT ICA:  92/8 cm/sec CCA:  106/8 cm/sec SYSTOLIC ICA/CCA RATIO:  0.9 ECA:  131 cm/sec LEFT ICA:  114/22 cm/sec CCA:  111/17 cm/sec SYSTOLIC ICA/CCA RATIO:  1.0 ECA:  88 cm/sec RIGHT CAROTID ARTERY: Minimal plaque at the right ICA origin. Estimated right ICA stenosis is less than 50%. RIGHT VERTEBRAL ARTERY: Antegrade flow with normal waveform and velocity. LEFT CAROTID ARTERY: Mild to moderate partially calcified plaque at the level of the carotid bulb extending into the proximal left ICA. Estimated left ICA stenosis is less than 50%. LEFT VERTEBRAL ARTERY: Antegrade flow with normal waveform and velocity. IMPRESSION: 1. Mild to moderate partially calcified plaque at the left carotid bulb extending into the proximal left ICA. Estimated left ICA stenosis is less than 50%. 2. Minimal plaque at the right ICA origin. Estimated right ICA stenosis is less than 50%. Electronically Signed   By: Irish Lack M.D.   On: 06/21/2023 12:15   CT HEAD CODE STROKE WO CONTRAST  Result Date: 06/21/2023 CLINICAL DATA:  Code stroke.  Acute neurologic deficit EXAM: CT HEAD WITHOUT CONTRAST TECHNIQUE: Contiguous axial images were obtained from the base of the skull through the vertex without intravenous contrast. RADIATION  DOSE REDUCTION: This exam was performed according to the departmental dose-optimization program which includes automated exposure control, adjustment of the mA and/or kV according to patient size and/or use of iterative reconstruction technique. COMPARISON:  None Available. FINDINGS: Brain: There is no mass, hemorrhage or extra-axial collection. There is generalized atrophy without lobar predilection. Old bilateral cerebellar infarcts. There is periventricular hypoattenuation compatible with chronic microvascular disease. Vascular: No abnormal hyperdensity of the major intracranial arteries or dural venous sinuses. No intracranial atherosclerosis. Skull:  The visualized skull base, calvarium and extracranial soft tissues are normal. Sinuses/Orbits: No fluid levels or advanced mucosal thickening of the visualized paranasal sinuses. No mastoid or middle ear effusion. The orbits are normal. ASPECTS [facility] Stroke Program Early CT Score) - Ganglionic level infarction (caudate, lentiform nuclei, internal capsule, insula, M1-M3 cortex): 7 - Supraganglionic infarction (M4-M6 cortex): 3 Total score (0-10 with 10 being normal): 10 IMPRESSION: 1. No acute intracranial abnormality. 2. ASPECTS is 10. These results were called by telephone at the time of interpretation on 06/21/2023 at 12:47 am to provider Geoffery Lyons , who verbally acknowledged these results. Electronically Signed   By: Deatra Robinson M.D.   On: 06/21/2023 00:47    Labs:  CBC: Recent Labs    06/29/23 0528 07/02/23 0520 07/02/23 1619 07/03/23 0249  WBC 8.6 16.1* 22.5* 26.3*  HGB 13.9 16.4 17.5* 14.4  HCT 40.0 46.2 50.5 41.6  PLT 188 231 219 227    COAGS: Recent Labs    06/21/23 0041  INR 0.9  APTT 26    BMP: Recent Labs    06/29/23 0528 07/02/23 0520 07/02/23 1619 07/03/23 0249  NA 137 133* 133* 133*  K 3.7 4.2 4.3 4.7  CL 100 94* 95* 100  CO2 25 25 25 26   GLUCOSE 101* 244* 176* 180*  BUN 16 19 21 22   CALCIUM 9.0 9.4 9.1 8.3*   CREATININE 1.30* 1.27* 1.30* 1.30*  GFRNONAA 56* 58* 56* 56*    LIVER FUNCTION TESTS: Recent Labs    06/21/23 0041 06/24/23 0728 07/02/23 0520 07/03/23 0249  BILITOT 0.6 0.8 0.9 1.4*  AST 19 22 20 20   ALT 14 17 22 16   ALKPHOS 59 66 87 65  PROT 7.1 6.7 7.6 6.2*  ALBUMIN 3.8 3.6 3.7 2.8*    TUMOR MARKERS: No results for input(s): "AFPTM", "CEA", "CA199", "CHROMGRNA" in the last 8760 hours.  Assessment and Plan:  Scheduled for percutaneous cholecystostomy drain placement  Risks and benefits discussed with the patient including, but not limited to bleeding, infection, gallbladder perforation, bile leak, sepsis or even death.  All of the patient's questions were answered, patient is agreeable to proceed. Consent signed and in chart.  Thank you for this interesting consult.  I greatly enjoyed meeting JERRITT OBERG and look forward to participating in their care.  A copy of this report was sent to the requesting provider on this date.  Electronically Signed: Robet Leu, PA-C 07/03/2023, 10:52 AM   I spent a total of 40 Minutes    in face to face in clinical consultation, greater than 50% of which was counseling/coordinating care for percutaneous cholecystostomy drain placement

## 2023-07-03 NOTE — Progress Notes (Signed)
Inpatient Rehabilitation Care Coordinator Discharge Note   Patient Details  Name: Hunter Sparks MRN: [id number] Date of Birth: 20-Oct-1946   Discharge location: D/c to acute due to medical reasons  Length of Stay: 9 days  Discharge activity level: Min Asst  Home/community participation: Limited  Patient response NW:GNFAOZ Literacy - How often do you need to have someone help you when you read instructions, pamphlets, or other written material from your doctor or pharmacy?: Never  Patient response HY:QMVHQI Isolation - How often do you feel lonely or isolated from those around you?: Patient declines to respond  Services provided included: MD, RD, PT, OT, SLP, CM, Neuropsych, SW, Pharmacy, TR, Industrial/product designer Services:  Field seismologist Utilized: Banker  Choices offered to/list presented to: N/A  Follow-up services arranged:              Patient response to transportation need: Is the patient able to respond to transportation needs?: Yes In the past 12 months, has lack of transportation kept you from medical appointments or from getting medications?: No In the past 12 months, has lack of transportation kept you from meetings, work, or from getting things needed for daily living?: No   Patient/Family verbalized understanding of follow-up arrangements:  Yes  Individual responsible for coordination of the follow-up plan: contact pt wife Hunter Sparks  Confirmed correct DME delivered: Gretchen Short 07/03/2023    Comments (or additional information):  Summary of Stay    Date/Time Discharge Planning CSW  07/02/23 0805 D/c remains home with wife who will be primary caregiver. Wife is unable to provide any physical assistance due to COPD. She would like him to be able to ambulate with an AD. SW will confirm there are no barriers to discharge. [name]  06/25/23 1143 D/c to home with wife who will be primary caregiver. Wife is unable to  provide any physical assistance due to COPD. She would like him to be able to ambulate with an AD. SW will confirm there are no barriers to discharge. [name]       Hunter Sparks

## 2023-07-04 DIAGNOSIS — R109 Unspecified abdominal pain: Secondary | ICD-10-CM | POA: Diagnosis not present

## 2023-07-04 LAB — COMPREHENSIVE METABOLIC PANEL
ALT: 54 U/L — ABNORMAL HIGH (ref 0–44)
AST: 99 U/L — ABNORMAL HIGH (ref 15–41)
Albumin: 2.1 g/dL — ABNORMAL LOW (ref 3.5–5.0)
Alkaline Phosphatase: 138 U/L — ABNORMAL HIGH (ref 38–126)
Anion gap: 11 (ref 5–15)
BUN: 37 mg/dL — ABNORMAL HIGH (ref 8–23)
CO2: 25 mmol/L (ref 22–32)
Calcium: 7.9 mg/dL — ABNORMAL LOW (ref 8.9–10.3)
Chloride: 98 mmol/L (ref 98–111)
Creatinine, Ser: 2.54 mg/dL — ABNORMAL HIGH (ref 0.61–1.24)
GFR, Estimated: 25 mL/min — ABNORMAL LOW (ref >60)
Glucose, Bld: 144 mg/dL — ABNORMAL HIGH (ref 70–99)
Potassium: 4.2 mmol/L (ref 3.5–5.1)
Sodium: 134 mmol/L — ABNORMAL LOW (ref 135–145)
Total Bilirubin: 0.8 mg/dL (ref 0.3–1.2)
Total Protein: 5.2 g/dL — ABNORMAL LOW (ref 6.5–8.1)

## 2023-07-04 LAB — CBC
HCT: 36.9 % — ABNORMAL LOW (ref 39.0–52.0)
Hemoglobin: 12.8 g/dL — ABNORMAL LOW (ref 13.0–17.0)
MCH: 32.2 pg (ref 26.0–34.0)
MCHC: 34.7 g/dL (ref 30.0–36.0)
MCV: 92.9 fL (ref 80.0–100.0)
Platelets: 175 10*3/uL (ref 150–400)
RBC: 3.97 MIL/uL — ABNORMAL LOW (ref 4.22–5.81)
RDW: 12.5 % (ref 11.5–15.5)
WBC: 24.2 10*3/uL — ABNORMAL HIGH (ref 4.0–10.5)
nRBC: 0 % (ref 0.0–0.2)

## 2023-07-04 LAB — GLUCOSE, CAPILLARY
Glucose-Capillary: 165 mg/dL — ABNORMAL HIGH (ref 70–99)
Glucose-Capillary: 124 mg/dL — ABNORMAL HIGH (ref 70–99)
Glucose-Capillary: 132 mg/dL — ABNORMAL HIGH (ref 70–99)
Glucose-Capillary: 135 mg/dL — ABNORMAL HIGH (ref 70–99)
Glucose-Capillary: 100 mg/dL — ABNORMAL HIGH (ref 70–99)
Glucose-Capillary: 108 mg/dL — ABNORMAL HIGH (ref 70–99)

## 2023-07-04 MED ORDER — SODIUM CHLORIDE 0.9 % IV SOLN
2.0000 g | INTRAVENOUS | Status: DC
  Administered 2023-07-04 – 2023-07-11 (×8): 2 g via INTRAVENOUS
  Filled 2023-07-04 (×8): qty 20

## 2023-07-04 MED ORDER — TRAMADOL HCL 50 MG PO TABS
50.0000 mg | ORAL_TABLET | Freq: Two times a day (BID) | ORAL | Status: DC | PRN
  Administered 2023-07-04 – 2023-07-06 (×3): 50 mg via ORAL
  Filled 2023-07-04 (×3): qty 1

## 2023-07-04 MED ORDER — DEXTROSE 5 % IN LACTATED RINGERS IV BOLUS
1000.0000 mL | Freq: Once | INTRAVENOUS | Status: AC
  Administered 2023-07-04: 1000 mL via INTRAVENOUS

## 2023-07-04 MED ORDER — SODIUM CHLORIDE 0.9 % IV BOLUS
500.0000 mL | Freq: Once | INTRAVENOUS | Status: AC
  Administered 2023-07-04: 500 mL via INTRAVENOUS

## 2023-07-04 MED ORDER — ALUM & MAG HYDROXIDE-SIMETH 200-200-20 MG/5ML PO SUSP
15.0000 mL | Freq: Four times a day (QID) | ORAL | Status: DC | PRN
  Administered 2023-07-09: 15 mL via ORAL
  Filled 2023-07-04: qty 30

## 2023-07-04 MED ORDER — OXYCODONE HCL 5 MG PO TABS
5.0000 mg | ORAL_TABLET | Freq: Four times a day (QID) | ORAL | Status: DC | PRN
  Administered 2023-07-04 – 2023-07-05 (×3): 5 mg via ORAL
  Filled 2023-07-04 (×4): qty 1

## 2023-07-04 MED ORDER — PANTOPRAZOLE SODIUM 40 MG PO TBEC
40.0000 mg | DELAYED_RELEASE_TABLET | Freq: Every day | ORAL | Status: DC
  Administered 2023-07-04 – 2023-07-14 (×11): 40 mg via ORAL
  Filled 2023-07-04 (×11): qty 1

## 2023-07-04 NOTE — Progress Notes (Signed)
Patient has not voided this shift. In and out received amber clear urine.

## 2023-07-04 NOTE — Progress Notes (Signed)
Referring Physician(s): Dr Grayling Congress  Supervising Physician: Roanna Banning  Patient Status:  [facility] - In-pt  Chief Complaint:  Cholecystitis   Subjective:  Perc Chole drain placed in IR 8/20 Asleep Hard to awake Drain intact C/d/I OP bloody bile Flush/asp easily  Allergies: Penicillins  Medications: Prior to Admission medications   Medication Sig Start Date End Date Taking? Authorizing Provider  acetaminophen (TYLENOL) 325 MG tablet Take 2 tablets (650 mg total) by mouth every 4 (four) hours as needed for mild pain (or temp > 37.5 C (99.5 F)). 06/23/23   Shon Hale, MD  aspirin EC 81 MG tablet Take 1 tablet (81 mg total) by mouth daily with breakfast. -Take Aspirin 81 mg daily along with Plavix 75 mg daily for 21 days then after that STOP the Plavix  and continue ONLY Aspirin 81 mg daily indefinitely-- 06/23/23   Shon Hale, MD  atorvastatin (LIPITOR) 40 MG tablet Take 1 tablet (40 mg total) by mouth daily. 06/24/23   Shon Hale, MD  blood glucose meter kit and supplies 1 each by Other route 4 (four) times daily. Dispense based on patient and insurance preference. Use up to four times daily as directed. (FOR ICD-10 E10.9, E11.9). 01/16/20   Roma Kayser, MD  clopidogrel (PLAVIX) 75 MG tablet Take 1 tablet (75 mg total) by mouth daily. Take Aspirin 81 mg daily along with Plavix 75 mg daily for 21 days then after that STOP the Plavix  and continue ONLY Aspirin 81 mg daily indefinitely-- 06/24/23   Shon Hale, MD  glucose blood (ONETOUCH VERIO) test strip 1 each by Other route 4 (four) times daily. Use as instructed 10/27/19   Roma Kayser, MD  insulin aspart (NOVOLOG) 100 UNIT/ML FlexPen Inject 0-10 Units into the skin 3 (three) times daily with meals. Short acting insulin per sliding scale 0-10 ---:-- Insulin injection 0-10 Units 0-10 Units Subcutaneous, 3 times daily with meals CBG 70 - 120: 0 unit CBG 121 - 150: 0 unit  CBG 151 - 200: 1 unit CBG  201 - 250: 2 units CBG 251 - 300: 4 units CBG 301 - 350: 6 units  CBG 351 - 400: 8 units  CBG > 400: 10 units 06/23/23   Emokpae, Courage, MD  insulin glargine, 2 Unit Dial, (TOUJEO MAX SOLOSTAR) 300 UNIT/ML Solostar Pen Inject 42 Units into the skin at bedtime. 06/23/23   Emokpae, Courage, MD  Insulin Pen Needle (B-D ULTRAFINE III SHORT PEN) 31G X 8 MM MISC 1 each by Does not apply route as directed. 10/27/19   Roma Kayser, MD  levothyroxine (SYNTHROID) 75 MCG tablet Take 1 tablet (75 mcg total) by mouth daily before breakfast. 06/23/23   Shon Hale, MD  losartan (COZAAR) 25 MG tablet Take 1 tablet (25 mg total) by mouth daily. 06/19/23   Antoine Poche, MD  metoprolol tartrate (LOPRESSOR) 25 MG tablet Take 1 tablet (25 mg total) by mouth 2 (two) times daily. 06/19/23   Antoine Poche, MD  senna-docusate (SENOKOT-S) 8.6-50 MG tablet Take 2 tablets by mouth at bedtime. 06/23/23   Shon Hale, MD  traMADol (ULTRAM) 50 MG tablet Take 1 tablet (50 mg total) by mouth every 6 (six) hours as needed. pain 06/19/15   Barrett, Erin R, PA-C  traZODone (DESYREL) 100 MG tablet Take 1 tablet (100 mg total) by mouth at bedtime. 06/23/23   Shon Hale, MD     Vital Signs: BP (!) 115/43   Pulse 96  Temp 97.6 F (36.4 C) (Oral)   Resp (!) 9   Wt 192 lb 14.4 oz (87.5 kg)   SpO2 97%   BMI 26.16 kg/m   Physical Exam Vitals reviewed.  Skin:    General: Skin is warm.     Comments: Site is clean and dry NT  No bleeding OP is bloody bile 30 cc in bag Drain flush/aspirates easily      Imaging: IR Perc Cholecystostomy  Result Date: 07/03/2023 INDICATION: 77 year old male referred for percutaneous cholecystostomy EXAM: IMAGE GUIDED PERCUTANEOUS CHOLECYSTOSTOMY MEDICATIONS: 15 mg IV Toradol Cefoxitin; The antibiotic was administered within an appropriate time frame prior to the initiation of the procedure. ANESTHESIA/SEDATION: Moderate (conscious) sedation was employed during this  procedure. A total of Versed 0.5 mg and Fentanyl 75 mcg was administered intravenously by the radiology nurse. Total intra-service moderate Sedation Time: 10 minutes. The patient's level of consciousness and vital signs were monitored continuously by radiology nursing throughout the procedure under my direct supervision. FLUOROSCOPY: Radiation Exposure Index (as provided by the fluoroscopic device): 6 mGy Kerma COMPLICATIONS: None PROCEDURE: Informed written consent was obtained from the patient and the patient's family after a thorough discussion of the procedural risks, benefits and alternatives. All questions were addressed. Maximal Sterile Barrier Technique was utilized including caps, mask, sterile gowns, sterile gloves, sterile drape, hand hygiene and skin antiseptic. A timeout was performed prior to the initiation of the procedure. Ultrasound survey of the right upper quadrant was performed for planning purposes. Once the patient is prepped and draped in the usual sterile fashion, the skin and subcutaneous tissues overlying the gallbladder were generously infiltrated 1% lidocaine for local anesthesia. A coaxial needle was advanced under ultrasound guidance through the skin subcutaneous tissues and a small segment of liver into the gallbladder lumen. With removal of the stylet, spontaneous dark bile drainage occurred. Using modified Seldinger technique, a 10 French drain was placed into the gallbladder fossa, with aspiration of the sample for the lab. Contrast injection confirmed position of the tube within the gallbladder lumen. Drainage catheter was attached to gravity drain with a suture retention placed. Patient tolerated the procedure well and remained hemodynamically stable throughout. No complications were encountered and no significant blood loss encountered. IMPRESSION: Status post image guided percutaneous cholecystostomy Signed, Yvone Neu. Miachel Roux, RPVI V[facility] Interventional Radiology  Specialists [facility] Radiology Electronically Signed   By: Gilmer Mor D.O.   On: 07/03/2023 16:13   US Abdomen Limited RUQ (LIVER/GB)  Addendum Date: 07/03/2023   ADDENDUM REPORT: 07/03/2023 00:54 ADDENDUM: Critical Value/emergent results were called by telephone at the time of interpretation on 07/03/2023 at 12:52 am to provider DR. CHEN, who verbally acknowledged these results. Electronically Signed   By: Almira Bar M.D.   On: 07/03/2023 00:54   Result Date: 07/03/2023 CLINICAL DATA:  [id number].  Right upper quadrant abdominal pain. EXAM: ULTRASOUND ABDOMEN LIMITED RIGHT UPPER QUADRANT COMPARISON:  CT without contrast 12/03/2021 FINDINGS: Gallbladder: There is a hydropic dilated gallbladder measuring 13 cm in length. There is thickening of the free wall to 4 mm, positive sonographic Murphy's sign, gallbladder sludge, and layering stones up to 9 mm. Trace pericholecystic fluid is also noted. Findings consistent with acute cholecystitis. Common bile duct: Diameter: 5.8 mm.  No intrahepatic biliary prominence. Liver: No focal lesion identified. There is mild increased parenchymal echogenicity consistent with fatty replacement. Portal vein is patent on color Doppler imaging with normal direction of blood flow towards the liver. Other: None. IMPRESSION: 1. Cholelithiasis,  and additional findings consistent with acute cholecystitis. 2. No biliary ductal dilatation. 3. Mild fatty replacement of the liver. Electronically Signed: By: Almira Bar M.D. On: 07/03/2023 00:41   DG Chest 2 View  Result Date: 07/02/2023 CLINICAL DATA:  Leukocytosis. EXAM: CHEST - 2 VIEW COMPARISON:  None Available. FINDINGS: The heart size and mediastinal contours are within normal limits. Both lungs are clear. The visualized skeletal structures are unremarkable. IMPRESSION: No active cardiopulmonary disease. Electronically Signed   By: Marin Roberts M.D.   On: 07/02/2023 18:06   MR BRAIN WO CONTRAST  Result Date:  07/02/2023 CLINICAL DATA:  Mental status change, unknown cause. EXAM: MRI HEAD WITHOUT CONTRAST TECHNIQUE: Multiplanar, multiecho pulse sequences of the brain and surrounding structures were obtained without intravenous contrast. COMPARISON:  Head MRI and MRA 06/21/2023 FINDINGS: The study is intermittently moderately motion degraded. Brain: Small acute to early subacute right cerebral hemispheric infarcts described on the prior MRI have evolved in the interim. Mildly restricted diffusion remains at the site of the posterior right basal ganglia infarct. No new infarct, mass, midline shift, or extra-axial fluid collection is identified. Patchy T2 hyperintensities in the cerebral white matter bilaterally are unchanged and nonspecific but compatible with moderate chronic small vessel ischemic disease. Chronic infarcts are again noted in the right basal ganglia, left corona radiata, pons, and both cerebellar hemispheres a chronic microhemorrhage in the right occipital lobe is unchanged. There is moderate cerebral atrophy. Vascular: Abnormal appearance of the distal left vertebral artery, more fully evaluated on the recent MRA. Skull and upper cervical spine: No suspicious marrow lesion. Sinuses/Orbits: Unremarkable orbits. Paranasal sinuses and mastoid air cells are clear. Other: None. IMPRESSION: 1. Interval evolution of the previously described recent infarcts. 2. No new infarct or other acute intracranial abnormality. 3. Moderate chronic small vessel ischemic disease with multiple chronic infarcts as above. Electronically Signed   By: Sebastian Ache M.D.   On: 07/02/2023 15:35    Labs:  CBC: Recent Labs    07/02/23 0520 07/02/23 1619 07/03/23 0249 07/04/23 0425  WBC 16.1* 22.5* 26.3* 24.2*  HGB 16.4 17.5* 14.4 12.8*  HCT 46.2 50.5 41.6 36.9*  PLT 231 219 227 175    COAGS: Recent Labs    06/21/23 0041 07/03/23 1116  INR 0.9 1.3*  APTT 26  --     BMP: Recent Labs    07/02/23 0520  07/02/23 1619 07/03/23 0249 07/04/23 0425  NA 133* 133* 133* 134*  K 4.2 4.3 4.7 4.2  CL 94* 95* 100 98  CO2 25 25 26 25   GLUCOSE 244* 176* 180* 144*  BUN 19 21 22  37*  CALCIUM 9.4 9.1 8.3* 7.9*  CREATININE 1.27* 1.30* 1.30* 2.54*  GFRNONAA 58* 56* 56* 25*    LIVER FUNCTION TESTS: Recent Labs    06/24/23 0728 07/02/23 0520 07/03/23 0249 07/04/23 0425  BILITOT 0.8 0.9 1.4* 0.8  AST 22 20 20  99*  ALT 17 22 16  54*  ALKPHOS 66 87 65 138*  PROT 6.7 7.6 6.2* 5.2*  ALBUMIN 3.6 3.7 2.8* 2.1*   Drain Location: RUQ Size: Fr size: 10 Fr Date of placement: 8/20  Currently to: Drain collection device: gravity 24 hour output:  Output by Drain (mL) 07/02/23 0701 - 07/02/23 1900 07/02/23 1901 - 07/03/23 0700 07/03/23 0701 - 07/03/23 1900 07/03/23 1901 - 07/04/23 0700 07/04/23 0701 - 07/04/23 0844  Biliary Tube Cook slip-coat 10.2 Fr. RUQ   100 100     Interval imaging/drain manipulation:  none  Current examination: Flushes/aspirates easily.  Insertion site unremarkable. Suture and stat lock in place. Dressed appropriately.  OP bloody bile ~30 cc in bag  Plan: Continue TID flushes with 5 cc NS. Record output Q shift. Dressing changes QD or PRN if soiled.  Call IR APP or on call IR MD if difficulty flushing or sudden change in drain output.   Discharge planning: Please contact IR APP or on call IR MD prior to patient d/c to ensure appropriate follow up plans are in place. Typically patient will follow up with IR clinic 6-8 weeks post d/c for repeat imaging/possible drain injection. IR scheduler will contact patient with date/time of appointment. Patient will need to flush drain QD with 5 cc NS, record output QD, dressing changes every 2-3 days or earlier if soiled.   IR will continue to follow - please call with questions or concerns.  Assessment: Percutaneous cholecystostomy drain placed in IR 8/20 IR will follow\plans per CCS   Electronically Signed: Robet Leu,  PA-C 07/04/2023, 8:43 AM   I spent a total of 15 Minutes at the the patient's bedside AND on the patient's hospital floor or unit, greater than 50% of which was counseling/coordinating care for perc chole drain

## 2023-07-04 NOTE — Progress Notes (Signed)
[facility]  PROGRESS NOTE  Hunter Sparks XBM:[id number] DOB: [birth year] DOA: 07/02/2023 PCP: Benita Stabile, MD   Brief HPI:   Recently discharged from the hospital will on June 23, 2023 after spending 2 days in the hospital for an acute stroke. 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 He has had left hemiparesis since then. D/c'd to CIR Patient was at rehab where he complained of severe abdominal pain for 2 days.  WBC was elevated 22.5.  He was sent to the hospital for further workup.  Right upper quadrant ultrasound showed acute cholecystitis.  General surgery was consulted and recommended IR place drain.  Drain placed on 8/20.    Assessment/Plan:   Acute cholecystitis -General Surgery consulted, cholecystostomy drain recommended per IR -High risk for surgery given being on Plavix and recent stroke -Antibiotics changed to Cipro and Flagyl -Will need outpatient follow-up with general surgery to discuss timing of cholecystectomy -diet started  Sepsis due to acute cholecystitis -Presented with sepsis physiology due to acute cholecystitis, -Initial lactic acid 2.9; improved to 1.2 -BP low after IV dilaudid-- will give IVF  Hemiparesis affecting left side as effect of CVA -Recent CVA causing left hemiparesis -? back to the inpatient rehab once medically stable -Continue Plavix, aspirin  Diabetes mellitus type 2 -SSI and insulin  CAD -Continue aspirin, Lipitor, Lopressor  Hypothyroidism -Continue Synthroid   Subjective   Sleepy after dilaudid    Data Reviewed:   CBG:  Recent Labs  Lab 07/03/23 1922 07/03/23 2315 07/03/23 2343 07/04/23 0329 07/04/23 0717  GLUCAP 78 69* 153* 165* 124*    SpO2: 97 % O2 Flow Rate (L/min): 2 L/min    Vitals:   07/04/23 0522 07/04/23 0634 07/04/23 0720 07/04/23  0800  BP: (!) 142/67 125/62 (!) 78/52 (!) 115/43  Pulse: (!) 102  90 96  Resp: 16  18 (!) 9  Temp:   97.6 F (36.4 C)   TempSrc:   Oral   SpO2: 98%  98% 97%  Weight:        General: Appearance:     Overweight male in no acute distress     Lungs:     respirations unlabored  Heart:    Normal heart rate.    MS:   All extremities are intact.   Neurologic:   Sleepy post dilaudid       Data Reviewed:  Basic Metabolic Panel: Recent Labs  Lab 06/29/23 0528 07/02/23 0520 07/02/23 1619 07/03/23 0249 07/04/23 0425  NA 137 133* 133* 133* 134*  K 3.7 4.2 4.3 4.7 4.2  CL 100 94* 95* 100 98  CO2 25 25 25 26 25   GLUCOSE 101* 244* 176* 180* 144*  BUN 16 19 21 22  37*  CREATININE 1.30* 1.27* 1.30* 1.30* 2.54*  CALCIUM 9.0 9.4 9.1 8.3* 7.9*  MG  --   --   --  1.9  --     CBC: Recent Labs  Lab 06/29/23 0528 07/02/23 0520 07/02/23 1619 07/03/23 0249 07/04/23 0425  WBC 8.6 16.1* 22.5* 26.3* 24.2*  NEUTROABS  --  12.7*  --  21.8*  --   HGB 13.9 16.4 17.5* 14.4 12.8*  HCT 40.0 46.2 50.5 41.6 36.9*  MCV 88.7 90.6 92.0 89.3 92.9  PLT 188 231 219 227 175    LFT Recent Labs  Lab 07/02/23 0520 07/03/23  0249 07/04/23 0425  AST 20 20 99*  ALT 22 16 54*  ALKPHOS 87 65 138*  BILITOT 0.9 1.4* 0.8  PROT 7.6 6.2* 5.2*  ALBUMIN 3.7 2.8* 2.1*     Antibiotics: Anti-infectives (From admission, onward)    Start     Dose/Rate Route Frequency Ordered Stop   07/03/23 1215  cefOXitin (MEFOXIN) 2 g in sodium chloride 0.9 % 100 mL IVPB        2 g 200 mL/hr over 30 Minutes Intravenous To Radiology 07/03/23 1116 07/03/23 1508   07/03/23 1130  ciprofloxacin (CIPRO) IVPB 400 mg        400 mg 200 mL/hr over 60 Minutes Intravenous Every 12 hours 07/03/23 1043     07/03/23 0800  metroNIDAZOLE (FLAGYL) IVPB 500 mg        500 mg 100 mL/hr over 60 Minutes Intravenous Every 12 hours 07/03/23 0052     07/03/23 0600  cefTRIAXone (ROCEPHIN) 2 g in sodium chloride 0.9 % 100 mL IVPB  Status:   Discontinued        2 g 200 mL/hr over 30 Minutes Intravenous Every 24 hours 07/03/23 0052 07/03/23 1043        DVT prophylaxis: Heparin  Code Status: Full code  Family Communication: none at bedisde   CONSULTS  General Surgery         Joseph Art   [facility] If 7PM-7AM, please contact night-coverage at [email or web address],   07/04/2023, 10:41 AM  LOS: 2 days

## 2023-07-04 NOTE — Progress Notes (Signed)
Patient intermittently hypotensive after receiving pain medication. x2 500 cc boluses given during my shift for hypotension.   Patient, however, is continuously crying out in pain, stating everything hurts. Wife states he has severe degenerative bone disease and back pain at home. Pain medication given in moderation for comfort.   Patient has not produced any urine during my 12 hour shift. Last bladder scan revealed 175 cc. MD made aware. Request that we hold off on straight cath until bladder becomes more full. Will pass to night shift.

## 2023-07-04 NOTE — Progress Notes (Addendum)
Patient ID: Hunter Sparks, male   DOB: 1946/06/27, 77 y.o.   MRN: [id number] [facility] Surgery Progress Note     Subjective: CC-  No family at bedside. Complaining of some abdominal pain.  Perc chole tube in and working  Objective: Vital signs in last 24 hours: Temp:  [97.6 F (36.4 C)-99.5 F (37.5 C)] 97.6 F (36.4 C) (08/21 0720) Pulse Rate:  [85-111] 96 (08/21 0800) Resp:  [9-35] 9 (08/21 0800) BP: (73-183)/(41-109) 115/43 (08/21 0800) SpO2:  [92 %-100 %] 97 % (08/21 0800) Weight:  [87.5 kg] 87.5 kg (08/21 0333) Last BM Date : 07/02/23  Intake/Output from previous day: 08/20 0701 - 08/21 0700 In: 1081.1 [I.V.:781.2; IV Piggyback:299.9] Out: 900 [Urine:700; Drains:200] Intake/Output this shift: No intake/output data recorded.  PE: Gen:  Alert, NAD, confused Abd: soft, mild distension, tender around drain, perc chole tube present with bilious fluid in bag  Lab Results:  Recent Labs    07/03/23 0249 07/04/23 0425  WBC 26.3* 24.2*  HGB 14.4 12.8*  HCT 41.6 36.9*  PLT 227 175   BMET Recent Labs    07/03/23 0249 07/04/23 0425  NA 133* 134*  K 4.7 4.2  CL 100 98  CO2 26 25  GLUCOSE 180* 144*  BUN 22 37*  CREATININE 1.30* 2.54*  CALCIUM 8.3* 7.9*   PT/INR Recent Labs    07/03/23 1116  LABPROT 16.2*  INR 1.3*   CMP     Component Value Date/Time   NA 134 (L) 07/04/2023 0425   K 4.2 07/04/2023 0425   CL 98 07/04/2023 0425   CO2 25 07/04/2023 0425   GLUCOSE 144 (H) 07/04/2023 0425   BUN 37 (H) 07/04/2023 0425   CREATININE 2.54 (H) 07/04/2023 0425   CREATININE 1.21 (H) 10/20/2019 0719   CALCIUM 7.9 (L) 07/04/2023 0425   PROT 5.2 (L) 07/04/2023 0425   ALBUMIN 2.1 (L) 07/04/2023 0425   AST 99 (H) 07/04/2023 0425   ALT 54 (H) 07/04/2023 0425   ALKPHOS 138 (H) 07/04/2023 0425   BILITOT 0.8 07/04/2023 0425   GFRNONAA 25 (L) 07/04/2023 0425   GFRNONAA 58 (L) 10/20/2019 0719   GFRAA 67 10/20/2019 0719   Lipase     Component Value  Date/Time   LIPASE 40 12/03/2021 1216       Studies/Results: IR Perc Cholecystostomy  Result Date: 07/03/2023 INDICATION: 77 year old male referred for percutaneous cholecystostomy EXAM: IMAGE GUIDED PERCUTANEOUS CHOLECYSTOSTOMY MEDICATIONS: 15 mg IV Toradol Cefoxitin; The antibiotic was administered within an appropriate time frame prior to the initiation of the procedure. ANESTHESIA/SEDATION: Moderate (conscious) sedation was employed during this procedure. A total of Versed 0.5 mg and Fentanyl 75 mcg was administered intravenously by the radiology nurse. Total intra-service moderate Sedation Time: 10 minutes. The patient's level of consciousness and vital signs were monitored continuously by radiology nursing throughout the procedure under my direct supervision. FLUOROSCOPY: Radiation Exposure Index (as provided by the fluoroscopic device): 6 mGy Kerma COMPLICATIONS: None PROCEDURE: Informed written consent was obtained from the patient and the patient's family after a thorough discussion of the procedural risks, benefits and alternatives. All questions were addressed. Maximal Sterile Barrier Technique was utilized including caps, mask, sterile gowns, sterile gloves, sterile drape, hand hygiene and skin antiseptic. A timeout was performed prior to the initiation of the procedure. Ultrasound survey of the right upper quadrant was performed for planning purposes. Once the patient is prepped and draped in the usual sterile fashion, the skin and subcutaneous tissues overlying the  gallbladder were generously infiltrated 1% lidocaine for local anesthesia. A coaxial needle was advanced under ultrasound guidance through the skin subcutaneous tissues and a small segment of liver into the gallbladder lumen. With removal of the stylet, spontaneous dark bile drainage occurred. Using modified Seldinger technique, a 10 French drain was placed into the gallbladder fossa, with aspiration of the sample for the lab.  Contrast injection confirmed position of the tube within the gallbladder lumen. Drainage catheter was attached to gravity drain with a suture retention placed. Patient tolerated the procedure well and remained hemodynamically stable throughout. No complications were encountered and no significant blood loss encountered. IMPRESSION: Status post image guided percutaneous cholecystostomy Signed, Yvone Neu. Miachel Roux, RPVI [facility] Radiology Electronically Signed   By: Gilmer Mor D.O.   On: 07/03/2023 16:13   US Abdomen Limited RUQ (LIVER/GB)  Addendum Date: 07/03/2023   ADDENDUM REPORT: 07/03/2023 00:54 ADDENDUM: Critical Value/emergent results were called by telephone at the time of interpretation on 07/03/2023 at 12:52 am to provider DR. CHEN, who verbally acknowledged these results. Electronically Signed   By: Almira Bar M.D.   On: 07/03/2023 00:54   Result Date: 07/03/2023 CLINICAL DATA:  [id number].  Right upper quadrant abdominal pain. EXAM: ULTRASOUND ABDOMEN LIMITED RIGHT UPPER QUADRANT COMPARISON:  CT without contrast 12/03/2021 FINDINGS: Gallbladder: There is a hydropic dilated gallbladder measuring 13 cm in length. There is thickening of the free wall to 4 mm, positive sonographic Murphy's sign, gallbladder sludge, and layering stones up to 9 mm. Trace pericholecystic fluid is also noted. Findings consistent with acute cholecystitis. Common bile duct: Diameter: 5.8 mm.  No intrahepatic biliary prominence. Liver: No focal lesion identified. There is mild increased parenchymal echogenicity consistent with fatty replacement. Portal vein is patent on color Doppler imaging with normal direction of blood flow towards the liver. Other: None. IMPRESSION: 1. Cholelithiasis, and additional findings consistent with acute cholecystitis. 2. No biliary ductal dilatation. 3. Mild fatty replacement of the liver. Electronically Signed: By: Almira Bar M.D.  On: 07/03/2023 00:41   DG Chest 2 View  Result Date: 07/02/2023 CLINICAL DATA:  Leukocytosis. EXAM: CHEST - 2 VIEW COMPARISON:  None Available. FINDINGS: The heart size and mediastinal contours are within normal limits. Both lungs are clear. The visualized skeletal structures are unremarkable. IMPRESSION: No active cardiopulmonary disease. Electronically Signed   By: Marin Roberts M.D.   On: 07/02/2023 18:06   MR BRAIN WO CONTRAST  Result Date: 07/02/2023 CLINICAL DATA:  Mental status change, unknown cause. EXAM: MRI HEAD WITHOUT CONTRAST TECHNIQUE: Multiplanar, multiecho pulse sequences of the brain and surrounding structures were obtained without intravenous contrast. COMPARISON:  Head MRI and MRA 06/21/2023 FINDINGS: The study is intermittently moderately motion degraded. Brain: Small acute to early subacute right cerebral hemispheric infarcts described on the prior MRI have evolved in the interim. Mildly restricted diffusion remains at the site of the posterior right basal ganglia infarct. No new infarct, mass, midline shift, or extra-axial fluid collection is identified. Patchy T2 hyperintensities in the cerebral white matter bilaterally are unchanged and nonspecific but compatible with moderate chronic small vessel ischemic disease. Chronic infarcts are again noted in the right basal ganglia, left corona radiata, pons, and both cerebellar hemispheres a chronic microhemorrhage in the right occipital lobe is unchanged. There is moderate cerebral atrophy. Vascular: Abnormal appearance of the distal left vertebral artery, more fully evaluated on the recent MRA. Skull and upper cervical spine: No suspicious marrow lesion. Sinuses/Orbits: Unremarkable orbits.  Paranasal sinuses and mastoid air cells are clear. Other: None. IMPRESSION: 1. Interval evolution of the previously described recent infarcts. 2. No new infarct or other acute intracranial abnormality. 3. Moderate chronic small vessel ischemic  disease with multiple chronic infarcts as above. Electronically Signed   By: Sebastian Ache M.D.   On: 07/02/2023 15:35    Anti-infectives: Anti-infectives (From admission, onward)    Start     Dose/Rate Route Frequency Ordered Stop   07/03/23 1215  cefOXitin (MEFOXIN) 2 g in sodium chloride 0.9 % 100 mL IVPB        2 g 200 mL/hr over 30 Minutes Intravenous To Radiology 07/03/23 1116 07/03/23 1508   07/03/23 1130  ciprofloxacin (CIPRO) IVPB 400 mg        400 mg 200 mL/hr over 60 Minutes Intravenous Every 12 hours 07/03/23 1043     07/03/23 0800  metroNIDAZOLE (FLAGYL) IVPB 500 mg        500 mg 100 mL/hr over 60 Minutes Intravenous Every 12 hours 07/03/23 0052     07/03/23 0600  cefTRIAXone (ROCEPHIN) 2 g in sodium chloride 0.9 % 100 mL IVPB  Status:  Discontinued        2 g 200 mL/hr over 30 Minutes Intravenous Every 24 hours 07/03/23 0052 07/03/23 1043        Assessment/Plan Acute calculous cholecystitis  - high risk surgical candidate given recent stroke, plavix - S/p perc chole 8/20 by IR. Continue drain and antibiotics. Advance diet as tolerated. I will arrange follow up in our office to discuss laparoscopic cholecystectomy in the future. I called and updated the patient's wife. We will sign off, please call with questions or concerns.   ID - cipro/flagyl VTE - SCDs, sq heparin, plavix FEN - IVF, reg diet Foley - none   Recent stroke 06/21/23 with left hemiparesis on plavix CKD DM CAD, hx CABG HTN Hypothyroidism  I reviewed hospitalist notes, last 24 h vitals and pain scores, last 48 h intake and output, last 24 h labs and trends, and last 24 h imaging results.    LOS: 2 days    Franne Forts, [facility] Surgery 07/04/2023, 10:55 AM Please see Amion for pager number during day hours 7:00am-4:30pm

## 2023-07-05 LAB — CBC
HCT: 39.9 % (ref 39.0–52.0)
Hemoglobin: 13.6 g/dL (ref 13.0–17.0)
MCH: 32.1 pg (ref 26.0–34.0)
MCHC: 34.1 g/dL (ref 30.0–36.0)
MCV: 94.1 fL (ref 80.0–100.0)
Platelets: 189 10*3/uL (ref 150–400)
RBC: 4.24 MIL/uL (ref 4.22–5.81)
RDW: 12.8 % (ref 11.5–15.5)
WBC: 19.3 10*3/uL — ABNORMAL HIGH (ref 4.0–10.5)
nRBC: 0 % (ref 0.0–0.2)

## 2023-07-05 LAB — COMPREHENSIVE METABOLIC PANEL
ALT: 51 U/L — ABNORMAL HIGH (ref 0–44)
AST: 114 U/L — ABNORMAL HIGH (ref 15–41)
Albumin: 2.4 g/dL — ABNORMAL LOW (ref 3.5–5.0)
Alkaline Phosphatase: 106 U/L (ref 38–126)
Anion gap: 14 (ref 5–15)
BUN: 50 mg/dL — ABNORMAL HIGH (ref 8–23)
CO2: 22 mmol/L (ref 22–32)
Calcium: 8 mg/dL — ABNORMAL LOW (ref 8.9–10.3)
Chloride: 99 mmol/L (ref 98–111)
Creatinine, Ser: 2.34 mg/dL — ABNORMAL HIGH (ref 0.61–1.24)
GFR, Estimated: 28 mL/min — ABNORMAL LOW (ref >60)
Glucose, Bld: 112 mg/dL — ABNORMAL HIGH (ref 70–99)
Potassium: 3.9 mmol/L (ref 3.5–5.1)
Sodium: 135 mmol/L (ref 135–145)
Total Bilirubin: 0.8 mg/dL (ref 0.3–1.2)
Total Protein: 5.8 g/dL — ABNORMAL LOW (ref 6.5–8.1)

## 2023-07-05 LAB — GLUCOSE, CAPILLARY
Glucose-Capillary: 100 mg/dL — ABNORMAL HIGH (ref 70–99)
Glucose-Capillary: 101 mg/dL — ABNORMAL HIGH (ref 70–99)
Glucose-Capillary: 107 mg/dL — ABNORMAL HIGH (ref 70–99)
Glucose-Capillary: 117 mg/dL — ABNORMAL HIGH (ref 70–99)
Glucose-Capillary: 64 mg/dL — ABNORMAL LOW (ref 70–99)
Glucose-Capillary: 68 mg/dL — ABNORMAL LOW (ref 70–99)
Glucose-Capillary: 77 mg/dL (ref 70–99)
Glucose-Capillary: 79 mg/dL (ref 70–99)
Glucose-Capillary: 97 mg/dL (ref 70–99)

## 2023-07-05 MED ORDER — ACETAMINOPHEN 500 MG PO TABS
1000.0000 mg | ORAL_TABLET | Freq: Three times a day (TID) | ORAL | Status: DC
  Administered 2023-07-05 – 2023-07-14 (×25): 1000 mg via ORAL
  Filled 2023-07-05 (×27): qty 2

## 2023-07-05 MED ORDER — OXYCODONE HCL 5 MG PO TABS
5.0000 mg | ORAL_TABLET | ORAL | Status: DC | PRN
  Administered 2023-07-06: 5 mg via ORAL
  Administered 2023-07-07: 10 mg via ORAL
  Administered 2023-07-07: 5 mg via ORAL
  Administered 2023-07-08: 10 mg via ORAL
  Administered 2023-07-08 – 2023-07-09 (×2): 5 mg via ORAL
  Filled 2023-07-05 (×3): qty 1
  Filled 2023-07-05 (×3): qty 2
  Filled 2023-07-05: qty 1

## 2023-07-05 MED ORDER — HYDROMORPHONE HCL 1 MG/ML IJ SOLN
0.5000 mg | Freq: Once | INTRAMUSCULAR | Status: AC
  Administered 2023-07-05: 0.5 mg via INTRAVENOUS
  Filled 2023-07-05: qty 0.5

## 2023-07-05 NOTE — Progress Notes (Addendum)
[facility]  PROGRESS NOTE  Hunter Sparks ZOX:[id number] DOB: 04-Jan-1946 DOA: 07/02/2023 PCP: Benita Stabile, MD   Brief HPI:   Recently discharged from the hospital will on June 23, 2023 after spending 2 days in the hospital for an acute stroke. 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 He has had left hemiparesis since then. D/c'd to CIR Patient was at rehab where he complained of severe abdominal pain for 2 days.  WBC was elevated 22.5.  He was sent to the hospital for further workup.  Right upper quadrant ultrasound showed acute cholecystitis.  General surgery was consulted and recommended IR place drain.  Drain placed on 8/20.    Assessment/Plan:   Acute cholecystitis -General Surgery consulted, cholecystostomy drain recommended per IR -High risk for surgery given being on Plavix and recent stroke -Antibiotics changed based on culture: Klebsiella -Will need outpatient follow-up with general surgery to discuss timing of cholecystectomy -diet started  Sepsis due to acute cholecystitis -Presented with sepsis physiology due to acute cholecystitis, -Initial lactic acid 2.9; improved to 1.2 -BP low after IV dilaudid- avoid  Hemiparesis affecting left side as effect of CVA -Recent CVA causing left hemiparesis -? back to the inpatient rehab once medically stable -Continue Plavix, aspirin  Diabetes mellitus type 2 -SSI and insulin  CAD -Continue aspirin, Lipitor, Lopressor  Hypothyroidism -Continue Synthroid  AKI -appears pre-renal -daily labs -IVF   Subjective   confused    Data Reviewed:   CBG:  Recent Labs  Lab 07/04/23 1935 07/04/23 2324 07/05/23 0326 07/05/23 0734 07/05/23 1143  GLUCAP 100* 108* 101* 117* 100*    SpO2: 99 % O2 Flow Rate (L/min): 2 L/min    Vitals:   07/04/23 2304  07/05/23 0310 07/05/23 0735 07/05/23 1144  BP: (!) 115/53 (!) 141/92 133/62 (!) 104/59  Pulse: 81 96 95 80  Resp: 11 20 (!) 24 16  Temp: 97.6 F (36.4 C) 98.2 F (36.8 C) 97.6 F (36.4 C) (!) 97.3 F (36.3 C)  TempSrc: Oral Oral Oral Axillary  SpO2: 95% 96% 98% 99%  Weight:         General: Appearance:     Overweight male in no acute distress     Lungs:      respirations unlabored  Heart:    Normal heart rate. Normal rhythm. No murmurs, rubs, or gallops.   MS:   All extremities are intact.   Neurologic:   Awake, alert       Data Reviewed:  Basic Metabolic Panel: Recent Labs  Lab 07/02/23 0520 07/02/23 1619 07/03/23 0249 07/04/23 0425 07/05/23 0437  NA 133* 133* 133* 134* 135  K 4.2 4.3 4.7 4.2 3.9  CL 94* 95* 100 98 99  CO2 25 25 26 25 22   GLUCOSE 244* 176* 180* 144* 112*  BUN 19 21 22  37* 50*  CREATININE 1.27* 1.30* 1.30* 2.54* 2.34*  CALCIUM 9.4 9.1 8.3* 7.9* 8.0*  MG  --   --  1.9  --   --     CBC: Recent Labs  Lab 07/02/23 0520 07/02/23 1619 07/03/23 0249 07/04/23 0425 07/05/23 0710  WBC 16.1* 22.5* 26.3* 24.2* 19.3*  NEUTROABS 12.7*  --  21.8*  --   --   HGB 16.4 17.5* 14.4 12.8* 13.6  HCT 46.2 50.5 41.6 36.9* 39.9  MCV 90.6 92.0 89.3 92.9  94.1  PLT 231 219 227 175 189    LFT Recent Labs  Lab 07/02/23 0520 07/03/23 0249 07/04/23 0425 07/05/23 0437  AST 20 20 99* 114*  ALT 22 16 54* 51*  ALKPHOS 87 65 138* 106  BILITOT 0.9 1.4* 0.8 0.8  PROT 7.6 6.2* 5.2* 5.8*  ALBUMIN 3.7 2.8* 2.1* 2.4*     Antibiotics: Anti-infectives (From admission, onward)    Start     Dose/Rate Route Frequency Ordered Stop   07/04/23 1445  cefTRIAXone (ROCEPHIN) 2 g in sodium chloride 0.9 % 100 mL IVPB        2 g 200 mL/hr over 30 Minutes Intravenous Every 24 hours 07/04/23 1356     07/03/23 1215  cefOXitin (MEFOXIN) 2 g in sodium chloride 0.9 % 100 mL IVPB        2 g 200 mL/hr over 30 Minutes Intravenous To Radiology 07/03/23 1116 07/03/23 1508    07/03/23 1130  ciprofloxacin (CIPRO) IVPB 400 mg  Status:  Discontinued        400 mg 200 mL/hr over 60 Minutes Intravenous Every 12 hours 07/03/23 1043 07/04/23 1356   07/03/23 0800  metroNIDAZOLE (FLAGYL) IVPB 500 mg  Status:  Discontinued        500 mg 100 mL/hr over 60 Minutes Intravenous Every 12 hours 07/03/23 0052 07/04/23 1356   07/03/23 0600  cefTRIAXone (ROCEPHIN) 2 g in sodium chloride 0.9 % 100 mL IVPB  Status:  Discontinued        2 g 200 mL/hr over 30 Minutes Intravenous Every 24 hours 07/03/23 0052 07/03/23 1043        DVT prophylaxis: Heparin  Code Status: Full code  Family Communication: wife at bedisde   CONSULTS  General Surgery         Joseph Art   [facility] If 7PM-7AM, please contact night-coverage at [email or web address],   07/05/2023, 11:56 AM  LOS: 3 days

## 2023-07-05 NOTE — Care Management Important Message (Signed)
Important Message  Patient Details  Name: Hunter Sparks MRN: [id number] Date of Birth: September 11, 1946   Medicare Important Message Given:  Yes     Dorena Bodo 07/05/2023, 2:45 PM

## 2023-07-05 NOTE — Progress Notes (Signed)
Physical Therapy Note  Patient Details  Name: Hunter Sparks MRN: [id number] Date of Birth: Mar 23, 1946 Today's Date: 07/05/2023    Physical Therapy Discharge Note  This patient was unable to complete the inpatient rehab program due to medical decline and discharge back to acute; therefore did not meet their long term goals. Pt left the program at a mod assist level for their functional mobility/ transfers. This patient is being discharged from PT services at this time.  Pt's perception of pain in the last five days was unable to answer at this time.    See CareTool for functional status details  If the patient is able to return to inpatient rehabilitation within 3 midnights, this may be considered an interrupted stay and therapy services will resume as ordered. Modification and reinstatement of their goals will be made upon completion of therapy service reevaluations.     Edwin Cap 07/05/2023, 3:45 PM

## 2023-07-05 NOTE — Progress Notes (Signed)
Inpatient Rehabilitation Discharge Medication Review by a Pharmacist  A complete drug regimen review was completed for this patient to identify any potential clinically significant medication issues.  High Risk Drug Classes Is patient taking? Indication by Medication  Antipsychotic No   Anticoagulant Yes Enoxaparin - VTE prophylaxis   Antibiotic Yes Levofloxacin, flagyl- UTI  Opioid Yes Hydromorphone - PRN moderate to severe pain Tramadol - PRN pain  Antiplatelet Yes Aspirin 81 mg, clopidogrel - stroke   Hypoglycemics/insulin Yes SSI, insulin glargine - DM type 2   Vasoactive Medication Yes Hydralazine, metoprolol - HTN  Chemotherapy No   Other Yes  Atrovastatin - hyperlipidemia Levothyroxine - hypothyroidism Trazodone - sleep Melatonin - sleep Zofran - nausea/ vomiting Promethazine - nausea/ vomiting        Type of Medication Issue Identified Description of Issue Recommendation(s)  Drug Interaction(s) (clinically significant)     Duplicate Therapy     Allergy     No Medication Administration End Date     Incorrect Dose     Additional Drug Therapy Needed     Significant med changes from prior encounter (inform family/care partners about these prior to discharge). Off PTA losartan      New Aspirin, clopidogrel, atorvastatin, trazodone. Permissive hypertension; resume if/when clinically warranted.   Communicate changes to patient/family prior to discharge. Clopidogrel stop time (3 weeks) in place.  Other       Clinically significant medication issues were identified that warrant physician communication and completion of prescribed/recommended actions by midnight of the next day:  No  Pharmacist comments:  Plan aspirin and clopidogrel for 3 weeks, then aspirin alone.  Plavix begun 06/21/23 > last dose 07/11/23.    Ruben Im, PharmD Clinical Pharmacist Please check AMION for all [facility] Pharmacy numbers

## 2023-07-05 NOTE — Evaluation (Signed)
Physical Therapy Evaluation Patient Details Name: Hunter Sparks MRN: [id number] DOB: 20-Jul-1946 Today's Date: 07/05/2023  History of Present Illness  Pt is 77 yo male who presents on 07/02/23 from [facility] with acute abdominal pain, underwent drain placement on 07/03/23, to have cholecystectomy soon. 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 Pt seen by PT/OT, was very confused on eval, only oriented to self. Of note, wife reports that he takes Tramadol at home for back pain, only  as needed. Has been getting stronger pain meds here. Needed max A +2 to come to EOB and stand. Pt with L lean and quick fatigue. Could not progress ambulation or go to chair as pt too confused and restless today. Expect mentation will improve with med changes and therapy will be able to progress mobility. Pt's wife hopeful for return to [facility].  Pt currently with functional limitations due to the deficits listed below (see PT Problem List). Pt will benefit from acute skilled PT to increase their independence and safety with mobility to allow discharge.           If plan is discharge home, recommend the following: Help with stairs or ramp for entrance;Assistance with cooking/housework;Two people to help with walking and/or transfers;Supervision due to cognitive status;Assist for transportation;Direct supervision/assist for financial management;Assistance with feeding;Direct supervision/assist for medications management;Two people to help with bathing/dressing/bathroom   Can travel by private vehicle        Equipment Recommendations Other (comment) (TBD)  Recommendations for Other Services  Rehab consult    Functional Status Assessment Patient has had a recent decline in their  functional status and demonstrates the ability to make significant improvements in function in a reasonable and predictable amount of time.     Precautions / Restrictions Precautions Precautions: Fall Precaution Comments: left mild hemiparesis, confusion Restrictions Weight Bearing Restrictions: No Other Position/Activity Restrictions: R sided percutaneous drain      Mobility  Bed Mobility Overal bed mobility: Needs Assistance Bed Mobility: Rolling, Sidelying to Sit, Sit to Supine Rolling: Mod assist Sidelying to sit: Max assist, +2 for physical assistance   Sit to supine: Max assist, +2 for physical assistance   General bed mobility comments: pt able to initiate supine to sit when he wanted to get up but when cued to do so needed max A +2 to sequence and get hips to EOB. L lean once up    Transfers Overall transfer level: Needs assistance Equipment used: 2 person hand held assist Transfers: Sit to/from Stand Sit to Stand: Max assist, +2 physical assistance           General transfer comment: L lean with sit>stand. Maintained ~20 secs before verbalizing the need to return to sitting    Ambulation/Gait               General Gait Details: could not progress due to decreased safety due to cognition  Stairs            Wheelchair Mobility     Tilt Bed    Modified Rankin (Stroke Patients Only) Modified Rankin (Stroke Patients Only) Pre-Morbid Rankin Score: No symptoms Modified Rankin: Severe disability     Balance Overall balance assessment: Needs assistance Sitting-balance support: Feet supported, No upper extremity supported Sitting balance-Leahy Scale:  Poor Sitting balance - Comments: L lean, mod A needed at times, intermittently needing only min A Postural control: Posterior lean, Left lateral lean Standing balance support: During functional activity, Bilateral upper extremity supported Standing balance-Leahy Scale: Poor Standing balance  comment: L lean, needed BUE support as well as max A +2                             Pertinent Vitals/Pain Pain Assessment Pain Assessment: Faces Faces Pain Scale: Hurts a little bit Pain Location: back Pain Descriptors / Indicators: Sore Pain Intervention(s): Monitored during session    Home Living Family/patient expects to be discharged to:: Private residence Living Arrangements: Spouse/significant other Available Help at Discharge: Family;Available 24 hours/day Type of Home: House Home Access: Stairs to enter Entrance Stairs-Rails: Left Entrance Stairs-Number of Steps: 3   Home Layout: One level Home Equipment: None      Prior Function Prior Level of Function : Independent/Modified Independent;Driving             Mobility Comments: Tourist information centre manager without AD ADLs Comments: Independent; drives     Extremity/Trunk Assessment   Upper Extremity Assessment Upper Extremity Assessment: Defer to OT evaluation    Lower Extremity Assessment Lower Extremity Assessment: LLE deficits/detail;Difficult to assess due to impaired cognition LLE Deficits / Details: able to move LLE against gravity but weaker than R, unable to be formally tested due to cognitive deficits    Cervical / Trunk Assessment Cervical / Trunk Assessment: Other exceptions Cervical / Trunk Exceptions: severe DDD per wife  Communication   Communication Communication: Difficulty following commands/understanding Following commands: Follows one step commands inconsistently Cueing Techniques: Verbal cues;Tactile cues;Gestural cues  Cognition Arousal: Lethargic Behavior During Therapy: Restless Overall Cognitive Status: Impaired/Different from baseline Area of Impairment: Orientation, Attention, Memory, Following commands, Awareness, Safety/judgement, Problem solving                 Orientation Level: Disoriented to, Place, Time, Situation Current Attention Level: Focused Memory:  Decreased short-term memory Following Commands: Follows one step commands inconsistently Safety/Judgement: Decreased awareness of safety, Decreased awareness of deficits Awareness: Intellectual Problem Solving: Decreased initiation, Difficulty sequencing, Requires verbal cues, Requires tactile cues, Slow processing General Comments: initially drowsy then awake and very confused. Kept asking where he was despite being told multiple times. Wife reports that he takes tramadol as needed at home for LBP but this AM RN reported he received dilaudid. Unable to problem solve or follow more than very simple commands        General Comments General comments (skin integrity, edema, etc.): wife present. Reports he is way off cognitively from his normal self and from how he was on CIR. VSS, SPO2 96% on RA, O2 left off and RN notified    Exercises     Assessment/Plan    PT Assessment Patient needs continued PT services  PT Problem List Decreased strength;Decreased activity tolerance;Decreased balance;Decreased mobility;Impaired sensation;Decreased coordination;Decreased cognition;Decreased knowledge of use of DME;Decreased safety awareness;Decreased knowledge of precautions       PT Treatment Interventions DME instruction;Gait training;Stair training;Functional mobility training;Therapeutic activities;Therapeutic exercise;Balance training;Patient/family education;Neuromuscular re-education;Cognitive remediation    PT Goals (Current goals can be found in the Care Plan section)  Acute Rehab PT Goals Patient Stated Goal: go back to rehab PT Goal Formulation: With patient/family Time For Goal Achievement: 07/19/23 Potential to Achieve Goals: Good    Frequency Min 1X/week     Co-evaluation PT/OT/SLP Co-Evaluation/Treatment: Yes Reason  for Co-Treatment: To address functional/ADL transfers;Necessary to address cognition/behavior during functional activity PT goals addressed during session:  Mobility/safety with mobility;Balance;Proper use of DME OT goals addressed during session: ADL's and self-care       AM-PAC PT "6 Clicks" Mobility  Outcome Measure Help needed turning from your back to your side while in a flat bed without using bedrails?: Total Help needed moving from lying on your back to sitting on the side of a flat bed without using bedrails?: Total Help needed moving to and from a bed to a chair (including a wheelchair)?: Total Help needed standing up from a chair using your arms (e.g., wheelchair or bedside chair)?: Total Help needed to walk in hospital room?: Total Help needed climbing 3-5 steps with a railing? : Total 6 Click Score: 6    End of Session Equipment Utilized During Treatment: Gait belt Activity Tolerance: Patient limited by fatigue;Treatment limited secondary to agitation Patient left: with call bell/phone within reach;with family/visitor present;in bed;with bed alarm set;with restraints reapplied Nurse Communication: Mobility status PT Visit Diagnosis: Unsteadiness on feet (R26.81);Other abnormalities of gait and mobility (R26.89);Muscle weakness (generalized) (M62.81);Hemiplegia and hemiparesis Hemiplegia - Right/Left: Left Hemiplegia - dominant/non-dominant: Non-dominant Hemiplegia - caused by: Cerebral infarction    Time: 7829-5621 PT Time Calculation (min) (ACUTE ONLY): 28 min   Charges:   PT Evaluation $PT Eval Moderate Complexity: 1 Mod   PT General Charges $$ ACUTE PT VISIT: 1 Visit         Lyanne Co, PT  Acute Rehab Services Secure chat preferred Office [phone]   Lawana Chambers Ananias Kolander 07/05/2023, 10:26 AM

## 2023-07-05 NOTE — Plan of Care (Signed)
Problem: RH Balance Goal: LTG Patient will maintain dynamic sitting balance (PT) Description: LTG:  Patient will maintain dynamic sitting balance with assistance during mobility activities (PT) Outcome: Not Met (add Reason) Goal: LTG Patient will maintain dynamic standing balance (PT) Description: LTG:  Patient will maintain dynamic standing balance with assistance during mobility activities (PT) Outcome: Not Met (add Reason)   Problem: Sit to Stand Goal: LTG:  Patient will perform sit to stand with assistance level (PT) Description: LTG:  Patient will perform sit to stand with assistance level (PT) Outcome: Not Met (add Reason)   Problem: RH Bed Mobility Goal: LTG Patient will perform bed mobility with assist (PT) Description: LTG: Patient will perform bed mobility with assistance, with/without cues (PT). Outcome: Not Met (add Reason)   Problem: RH Bed to Chair Transfers Goal: LTG Patient will perform bed/chair transfers w/assist (PT) Description: LTG: Patient will perform bed to chair transfers with assistance (PT). Outcome: Not Met (add Reason)   Problem: RH Car Transfers Goal: LTG Patient will perform car transfers with assist (PT) Description: LTG: Patient will perform car transfers with assistance (PT). Outcome: Not Met (add Reason)   Problem: RH Floor Transfers Goal: LTG Patient will perform floor transfers w/assist (PT) Description: LTG: Patient will perform floor transfers with assistance (PT). Outcome: Not Met (add Reason)   Problem: RH Ambulation Goal: LTG Patient will ambulate in controlled environment (PT) Description: LTG: Patient will ambulate in a controlled environment, # of feet with assistance (PT). Outcome: Not Met (add Reason) Goal: LTG Patient will ambulate in home environment (PT) Description: LTG: Patient will ambulate in home environment, # of feet with assistance (PT). Outcome: Not Met (add Reason) Goal: LTG Patient will ambulate in community  environment (PT) Description: LTG: Patient will ambulate in community environment, # of feet with assistance (PT). Outcome: Not Met (add Reason)   Problem: RH Wheelchair Mobility Goal: LTG Patient will propel w/c in controlled environment (PT) Description: LTG: Patient will propel wheelchair in controlled environment, # of feet with assist (PT) Outcome: Not Met (add Reason) Goal: LTG Patient will propel w/c in home environment (PT) Description: LTG: Patient will propel wheelchair in home environment, # of feet with assistance (PT). Outcome: Not Met (add Reason) Goal: LTG Patient will propel w/c in community environment (PT) Description: LTG: Patient will propel wheelchair in community environment, # of feet with assist (PT) Outcome: Not Met (add Reason)   Problem: RH Stairs Goal: LTG Patient will ambulate up and down stairs w/assist (PT) Description: LTG: Patient will ambulate up and down # of stairs with assistance (PT) Outcome: Not Met (add Reason)   Problem: RH Balance Goal: LTG Patient will maintain dynamic sitting balance (PT) Description: LTG:  Patient will maintain dynamic sitting balance with assistance during mobility activities (PT) Outcome: Not Met (add Reason) Goal: LTG Patient will maintain dynamic standing balance (PT) Description: LTG:  Patient will maintain dynamic standing balance with assistance during mobility activities (PT) Outcome: Not Met (add Reason)   Problem: Sit to Stand Goal: LTG:  Patient will perform sit to stand with assistance level (PT) Description: LTG:  Patient will perform sit to stand with assistance level (PT) Outcome: Not Met (add Reason)   Problem: RH Bed Mobility Goal: LTG Patient will perform bed mobility with assist (PT) Description: LTG: Patient will perform bed mobility with assistance, with/without cues (PT). Outcome: Not Met (add Reason)   Problem: RH Bed to Chair Transfers Goal: LTG Patient will perform bed/chair transfers  w/assist (PT) Description:  LTG: Patient will perform bed to chair transfers with assistance (PT). Outcome: Not Met (add Reason)   Problem: RH Car Transfers Goal: LTG Patient will perform car transfers with assist (PT) Description: LTG: Patient will perform car transfers with assistance (PT). Outcome: Not Met (add Reason)   Problem: RH Ambulation Goal: LTG Patient will ambulate in controlled environment (PT) Description: LTG: Patient will ambulate in a controlled environment, # of feet with assistance (PT). Outcome: Not Met (add Reason) Goal: LTG Patient will ambulate in home environment (PT) Description: LTG: Patient will ambulate in home environment, # of feet with assistance (PT). Outcome: Not Met (add Reason)   Problem: RH Wheelchair Mobility Goal: LTG Patient will propel w/c in controlled environment (PT) Description: LTG: Patient will propel wheelchair in controlled environment, # of feet with assist (PT) Outcome: Not Met (add Reason)   Problem: RH Stairs Goal: LTG Patient will ambulate up and down stairs w/assist (PT) Description: LTG: Patient will ambulate up and down # of stairs with assistance (PT) Outcome: Not Met (add Reason)

## 2023-07-05 NOTE — Plan of Care (Signed)
  Problem: Education: Goal: Knowledge of disease or condition will improve Outcome: Progressing Goal: Knowledge of secondary prevention will improve (MUST DOCUMENT ALL) Outcome: Progressing   

## 2023-07-05 NOTE — Evaluation (Signed)
Occupational Therapy Evaluation Patient Details Name: Hunter Sparks MRN: [id number] DOB: April 15, 1946 Today's Date: 07/05/2023   History of Present Illness Pt is 77 yo male who presents on 07/02/23 from CIR with acute abdominal pain, underwent drain placement on 07/03/23, to have cholecystectomy soon. 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  Not oriented to place, time, situation or his first name when asked this session.  Decreased ability to consistently follow one step commands.  Overall mod assist for grooming and feeding tasks with max +2 for supine to sit and for standing EOB with moderate left pushing noted in standing.  Prior to hospital admission for CVA pt was independent at home living with his spouse.  Feel he will benefit from acute care OT to help progress ADL independence and reduce burden of care.  Recommend that patient will benefit from intensive inpatient follow up therapy, >3 hours/day post acute stay.         If plan is discharge home, recommend the following: A lot of help with walking and/or transfers;Help with stairs or ramp for entrance;Assist for transportation;A lot of help with bathing/dressing/bathroom;Assistance with cooking/housework;Direct supervision/assist for financial management;Supervision due to cognitive status;Direct supervision/assist for medications management    Functional Status Assessment  Patient has had a recent decline in their functional status and demonstrates the ability to make significant improvements in function in a reasonable and predictable amount of time.  Equipment Recommendations  Other (comment) (TBD next venue of care)        Precautions / Restrictions Precautions Precautions: Fall Precaution Comments: left mild hemiparesis, confusion Restrictions Weight Bearing Restrictions: No Other Position/Activity Restrictions: R sided percutaneous drain      Mobility Bed Mobility Overal bed mobility: Needs Assistance Bed Mobility: Supine to Sit, Rolling Rolling: Mod assist Sidelying to sit: Max assist, +2 for physical assistance   Sit to supine: Max assist, +2 for physical assistance   General bed mobility comments: Pt somewhat restless, had to guide him with tactile cueing for initiating and completing all pieces of rolling and bed mobility.    Transfers Overall transfer level: Needs assistance Equipment used: 2 person hand held assist Transfers: Sit to/from Stand Sit to Stand: Max assist, +2 physical assistance           General transfer comment: Increased left and posteriorpushing in standing      Balance Overall balance assessment: Needs assistance Sitting-balance support: Feet supported, No upper extremity supported Sitting balance-Leahy Scale: Poor Sitting balance - Comments: Posterior lean with assist needed to maintain static sitting balance.   Standing balance support: During functional activity, Bilateral upper extremity supported Standing balance-Leahy Scale: Poor Standing balance comment: Max +2 for standing balance                           ADL either performed or assessed with clinical judgement   ADL Overall ADL's : Needs assistance/impaired Eating/Feeding: Moderate assistance;Sitting Eating/Feeding Details (indicate cue type and reason): drink from cup Grooming: Wash/dry face;Maximal assistance;Sitting   Upper Body Bathing: Maximal assistance;Sitting Upper Body Bathing Details (indicate cue type and reason): simulated Lower Body Bathing: Maximal assistance;+2 for physical assistance;Sit to/from stand Lower Body  Bathing Details (indicate cue type and reason):  simulated     Lower Body Dressing: Total assistance Lower Body Dressing Details (indicate cue type and reason): gripper socks only Toilet Transfer: Maximal assistance;+2 for physical assistance Toilet Transfer Details (indicate cue type and reason): simulated standing at the EOB Toileting- Clothing Manipulation and Hygiene: Maximal assistance;+2 for physical assistance Toileting - Clothing Manipulation Details (indicate cue type and reason): simulated standing at the EOB     Functional mobility during ADLs: Maximal assistance;+2 for safety/equipment (standing EOB) General ADL Comments: Pt with decreased sustained attention and increased confusion.  Max assist for transition to EOB with overall mod assist for static sitting balance secondary to posterior lean and LOB.  Greater assist with simulated selfcare tasks this session, likely attributed to decreased cognition.  Will continue to monitor and assess in treatment.  Spouse present during session and concerned with patients confused state as it is not close to his baseline prior to surgery.     Vision Baseline Vision/History: 1 Wears glasses Ability to See in Adequate Light: 0 Adequate Patient Visual Report: Other (comment) (unable to determine secondary to cognition)       Perception Perception: Not tested       Praxis Praxis: Not tested       Pertinent Vitals/Pain Pain Assessment Pain Assessment: Faces     Extremity/Trunk Assessment Upper Extremity Assessment Upper Extremity Assessment: Difficult to assess due to impaired cognition LUE Deficits / Details: Pt able to squeeze therapist's hands to command with WNL grip bilaterally.  He was also able to bring his LUE up to scratch behind his neck.  Functionally, he was able to use the LUE to assist the right with holding his orange juice but coordination overall looks to be slightly impaired.  Will look at more specifically in function. LUE Sensation:  (unable to determine) LUE  Coordination: decreased gross motor;decreased fine motor   Lower Extremity Assessment Lower Extremity Assessment: Defer to PT evaluation LLE Deficits / Details: able to move LLE against gravity but weaker than R, unable to be formally tested due to cognitive deficits   Cervical / Trunk Assessment Cervical / Trunk Assessment: Other exceptions Cervical / Trunk Exceptions: severe DDD per wife   Communication Communication Communication: Difficulty following commands/understanding Following commands: Follows one step commands inconsistently Cueing Techniques: Verbal cues;Tactile cues;Gestural cues   Cognition Arousal: Lethargic Behavior During Therapy: Restless Overall Cognitive Status: Impaired/Different from baseline Area of Impairment: Orientation, Attention, Memory, Following commands, Safety/judgement, Awareness, Problem solving                 Orientation Level: Place, Time, Situation, Person (Not oriented to place and when asked what is first name was he stated, "I don't know".) Current Attention Level: Focused Memory: Decreased short-term memory Following Commands: Follows one step commands inconsistently Safety/Judgement: Decreased awareness of safety   Problem Solving: Slow processing, Decreased initiation, Difficulty sequencing, Requires verbal cues, Requires tactile cues General Comments: Pt needed step by step cueing for all tasks with decreased sustained attention throughout and repeatedly asking who therapist's were, where he was.  Unable to recall when given answers to those questions.     General Comments  wife present. Reports he is way off cognitively from his normal self and from how he was on CIR. VSS, SPO2 96% on RA, O2 left off and RN notified            Home Living Family/patient expects to be discharged to:: Private residence Living Arrangements: Spouse/significant  other Available Help at Discharge: Family;Available 24 hours/day Type of Home:  House Home Access: Stairs to enter [employer] of Steps: 3 Entrance Stairs-Rails: Left Home Layout: One level     Bathroom Shower/Tub: Chief Strategy Officer: Handicapped height Bathroom Accessibility: Yes   Home Equipment: None      Lives With: Spouse    Prior Functioning/Environment Prior Level of Function : Independent/Modified Independent;Driving             Mobility Comments: Tourist information centre manager without AD ADLs Comments: Independent; drives        OT Problem List: Decreased strength;Decreased range of motion;Decreased activity tolerance;Impaired balance (sitting and/or standing);Decreased coordination;Decreased safety awareness;Decreased cognition;Pain;Impaired UE functional use;Decreased knowledge of use of DME or AE      OT Treatment/Interventions: Self-care/ADL training;Therapeutic exercise;DME and/or AE instruction;Therapeutic activities;Balance training;Canalith reposition;Patient/family education;Neuromuscular education;Cognitive remediation/compensation    OT Goals(Current goals can be found in the care plan section) Acute Rehab OT Goals Patient Stated Goal: Pt's spouse wants him to get better.  Pt unable to state goals secondary to confusion OT Goal Formulation: With patient/family Time For Goal Achievement: 07/19/23 Potential to Achieve Goals: Good  OT Frequency: Min 1X/week    Co-evaluation PT/OT/SLP Co-Evaluation/Treatment: Yes Reason for Co-Treatment: To address functional/ADL transfers;Necessary to address cognition/behavior during functional activity PT goals addressed during session: Mobility/safety with mobility;Balance;Proper use of DME OT goals addressed during session: ADL's and self-care      AM-PAC OT "6 Clicks" Daily Activity     Outcome Measure Help from another person eating meals?: A Lot Help from another person taking care of personal grooming?: A Lot Help from another person toileting, which includes using  toliet, bedpan, or urinal?: Total Help from another person bathing (including washing, rinsing, drying)?: Total Help from another person to put on and taking off regular upper body clothing?: A Lot Help from another person to put on and taking off regular lower body clothing?: Total 6 Click Score: 9   End of Session Equipment Utilized During Treatment: Gait belt Nurse Communication: Mobility status  Activity Tolerance: Other (comment) (limited secondary to confusion) Patient left: in bed;with call bell/phone within reach;with bed alarm set;with family/visitor present  OT Visit Diagnosis: Unsteadiness on feet (R26.81);Other abnormalities of gait and mobility (R26.89);Muscle weakness (generalized) (M62.81);Hemiplegia and hemiparesis;Other symptoms and signs involving the nervous system (R29.898);Feeding difficulties (R63.3);Pain;Other symptoms and signs involving cognitive function Hemiplegia - Right/Left: Left Hemiplegia - dominant/non-dominant: Non-Dominant Hemiplegia - caused by: Cerebral infarction Pain - Right/Left:  (back pain)                Time: 1610-9604 OT Time Calculation (min): 36 min Charges:  OT General Charges $OT Visit: 1 Visit OT Evaluation $OT Eval Moderate Complexity: 1 Mod Perrin Maltese, OTR/L Acute Rehabilitation Services  Office [phone] 07/05/2023

## 2023-07-05 NOTE — Progress Notes (Signed)
Occupational Therapy Discharge Note  This patient was unable to complete the inpatient rehab program due to change in medical status with onset of abdominal pain; therefore did not meet their long term goals. Pt left the program at a mod to max assist level for their  functional ADLs. This patient is being discharged from OT services at this time.  BIMS at time of d/c  Pt unable to complete due to medical status  See CareTool for functional status details.  If the patient is able to return to inpatient rehabilitation within 3 midnights, this may be considered an interrupted stay and therapy services will resume as ordered. Modification and reinstatement of their goals will be made upon completion of therapy service reevaluations.

## 2023-07-06 ENCOUNTER — Inpatient Hospital Stay (HOSPITAL_COMMUNITY): Payer: PPO

## 2023-07-06 DIAGNOSIS — R109 Unspecified abdominal pain: Secondary | ICD-10-CM

## 2023-07-06 LAB — GLUCOSE, CAPILLARY
Glucose-Capillary: 128 mg/dL — ABNORMAL HIGH (ref 70–99)
Glucose-Capillary: 143 mg/dL — ABNORMAL HIGH (ref 70–99)
Glucose-Capillary: 164 mg/dL — ABNORMAL HIGH (ref 70–99)
Glucose-Capillary: 166 mg/dL — ABNORMAL HIGH (ref 70–99)
Glucose-Capillary: 173 mg/dL — ABNORMAL HIGH (ref 70–99)
Glucose-Capillary: 67 mg/dL — ABNORMAL LOW (ref 70–99)
Glucose-Capillary: 86 mg/dL (ref 70–99)
Glucose-Capillary: 89 mg/dL (ref 70–99)

## 2023-07-06 LAB — CBC
HCT: 37.6 % — ABNORMAL LOW (ref 39.0–52.0)
Hemoglobin: 12.5 g/dL — ABNORMAL LOW (ref 13.0–17.0)
MCH: 30.4 pg (ref 26.0–34.0)
MCHC: 33.2 g/dL (ref 30.0–36.0)
MCV: 91.5 fL (ref 80.0–100.0)
Platelets: 198 10*3/uL (ref 150–400)
RBC: 4.11 MIL/uL — ABNORMAL LOW (ref 4.22–5.81)
RDW: 12.8 % (ref 11.5–15.5)
WBC: 16.5 10*3/uL — ABNORMAL HIGH (ref 4.0–10.5)
nRBC: 0 % (ref 0.0–0.2)

## 2023-07-06 LAB — BASIC METABOLIC PANEL
Anion gap: 7 (ref 5–15)
BUN: 44 mg/dL — ABNORMAL HIGH (ref 8–23)
CO2: 27 mmol/L (ref 22–32)
Calcium: 8.2 mg/dL — ABNORMAL LOW (ref 8.9–10.3)
Chloride: 101 mmol/L (ref 98–111)
Creatinine, Ser: 1.74 mg/dL — ABNORMAL HIGH (ref 0.61–1.24)
GFR, Estimated: 40 mL/min — ABNORMAL LOW (ref >60)
Glucose, Bld: 155 mg/dL — ABNORMAL HIGH (ref 70–99)
Potassium: 3.9 mmol/L (ref 3.5–5.1)
Sodium: 135 mmol/L (ref 135–145)

## 2023-07-06 MED ORDER — HALOPERIDOL LACTATE 5 MG/ML IJ SOLN
2.0000 mg | Freq: Four times a day (QID) | INTRAMUSCULAR | Status: DC | PRN
  Administered 2023-07-07 (×2): 2 mg via INTRAVENOUS
  Filled 2023-07-06 (×3): qty 1

## 2023-07-06 MED ORDER — LIDOCAINE 5 % EX PTCH
1.0000 | MEDICATED_PATCH | CUTANEOUS | Status: DC
  Administered 2023-07-06 – 2023-07-14 (×7): 1 via TRANSDERMAL
  Filled 2023-07-06 (×8): qty 1

## 2023-07-06 MED ORDER — ENSURE ENLIVE PO LIQD
237.0000 mL | Freq: Three times a day (TID) | ORAL | Status: DC
  Administered 2023-07-06 – 2023-07-14 (×17): 237 mL via ORAL

## 2023-07-06 MED ORDER — TRAMADOL HCL 50 MG PO TABS
50.0000 mg | ORAL_TABLET | Freq: Four times a day (QID) | ORAL | Status: DC | PRN
  Administered 2023-07-06 – 2023-07-08 (×4): 50 mg via ORAL
  Filled 2023-07-06 (×5): qty 1

## 2023-07-06 MED ORDER — SIMETHICONE 80 MG PO CHEW
80.0000 mg | CHEWABLE_TABLET | Freq: Four times a day (QID) | ORAL | Status: DC | PRN
  Administered 2023-07-06: 80 mg via ORAL
  Filled 2023-07-06 (×2): qty 1

## 2023-07-06 NOTE — Plan of Care (Signed)
  Problem: Education: Goal: Knowledge of disease or condition will improve Outcome: Progressing Goal: Knowledge of secondary prevention will improve (MUST DOCUMENT ALL) Outcome: Progressing Goal: Knowledge of patient specific risk factors will improve (Mark N/A or DELETE if not current risk factor) Outcome: Progressing   Problem: Ischemic Stroke/TIA Tissue Perfusion: Goal: Complications of ischemic stroke/TIA will be minimized Outcome: Progressing   Problem: Coping: Goal: Will verbalize positive feelings about self Outcome: Progressing Goal: Will identify appropriate support needs Outcome: Progressing   Problem: Health Behavior/Discharge Planning: Goal: Ability to manage health-related needs will improve Outcome: Progressing Goal: Goals will be collaboratively established with patient/family Outcome: Progressing   Problem: Self-Care: Goal: Ability to participate in self-care as condition permits will improve Outcome: Progressing Goal: Verbalization of feelings and concerns over difficulty with self-care will improve Outcome: Progressing Goal: Ability to communicate needs accurately will improve Outcome: Progressing   Problem: Nutrition: Goal: Risk of aspiration will decrease Outcome: Progressing Goal: Dietary intake will improve Outcome: Progressing   Problem: Education: Goal: Knowledge of General Education information will improve Description: Including pain rating scale, medication(s)/side effects and non-pharmacologic comfort measures Outcome: Progressing   Problem: Health Behavior/Discharge Planning: Goal: Ability to manage health-related needs will improve Outcome: Progressing   Problem: Clinical Measurements: Goal: Ability to maintain clinical measurements within normal limits will improve Outcome: Progressing Goal: Will remain free from infection Outcome: Progressing Goal: Diagnostic test results will improve Outcome: Progressing Goal: Respiratory  complications will improve Outcome: Progressing Goal: Cardiovascular complication will be avoided Outcome: Progressing   Problem: Activity: Goal: Risk for activity intolerance will decrease Outcome: Progressing   Problem: Nutrition: Goal: Adequate nutrition will be maintained Outcome: Progressing   Problem: Coping: Goal: Level of anxiety will decrease Outcome: Progressing   Problem: Elimination: Goal: Will not experience complications related to bowel motility Outcome: Progressing Goal: Will not experience complications related to urinary retention Outcome: Progressing   Problem: Pain Managment: Goal: General experience of comfort will improve Outcome: Progressing   Problem: Safety: Goal: Ability to remain free from injury will improve Outcome: Progressing   Problem: Skin Integrity: Goal: Risk for impaired skin integrity will decrease Outcome: Progressing   Problem: Education: Goal: Ability to describe self-care measures that may prevent or decrease complications (Diabetes Survival Skills Education) will improve Outcome: Progressing Goal: Individualized Educational Video(s) Outcome: Progressing   Problem: Coping: Goal: Ability to adjust to condition or change in health will improve Outcome: Progressing   Problem: Fluid Volume: Goal: Ability to maintain a balanced intake and output will improve Outcome: Progressing   Problem: Health Behavior/Discharge Planning: Goal: Ability to identify and utilize available resources and services will improve Outcome: Progressing Goal: Ability to manage health-related needs will improve Outcome: Progressing   Problem: Metabolic: Goal: Ability to maintain appropriate glucose levels will improve Outcome: Progressing   Problem: Nutritional: Goal: Maintenance of adequate nutrition will improve Outcome: Progressing Goal: Progress toward achieving an optimal weight will improve Outcome: Progressing   Problem: Skin  Integrity: Goal: Risk for impaired skin integrity will decrease Outcome: Progressing   Problem: Tissue Perfusion: Goal: Adequacy of tissue perfusion will improve Outcome: Progressing   

## 2023-07-06 NOTE — Progress Notes (Signed)
Inpatient Rehabilitation Admissions Coordinator   I met at bedside with wife. Patient up in chair. Discussed with Dr Shearon Stalls. We await further progress with his delirium to clear before proceeding with [employer] to readmit to CIR. We will follow up on Monday.  Ottie Glazier, RN, MSN Rehab Admissions Coordinator [phone] 07/06/2023 11:55 AM

## 2023-07-06 NOTE — Consult Note (Signed)
Physical Medicine and Rehabilitation Consult Reason for Consult: Evaluate appropriateness for Inpatient Rehab Referring Physician: Dr. Benjamine Mola    HPI: Hunter Sparks is a 77 y.o. male with PMHx of  has a past medical history of Angina, class II (HCC), Arthritis, Chest pain, Chronic lower back pain, Depression, DJD (degenerative joint disease), Hyperlipidemia (06/11/2015), Hypertension, Hypothyroidism, Kidney stone, Kidney stone, and Type II diabetes mellitus (HCC). Marland Kitchen admitted 06/21/2023 after being found with slurred speech and left-sided weakness. Patient had stroke workup MRI showed scattered small infarct right basal ganglia, frontal, parietal, occipital lobes-MCA/PCA watershed distribution. Patient was seen by neurology services and placed on low-dose aspirin and Plavix x 3 weeks then aspirin alone. Close monitoring of permissive hypertension. Therapy evaluations completed due to patient decreased functional mobility left-sided weakness was admitted for a comprehensive rehab program.  On 8/19, patient was transferred back to acute for delirium, sepsis, and abdominal pain with eventual cause determined to be acute cholecystitis.  MRI prior to transfer did not show any new diffusion deficits.  Since admission, general surgery was consulted and IR placed gallbladder drain on 8-20.  Felt patient was a high surgical risk given Plavix and recent stroke, and decided to defer cholecystectomy until patient was more stable.  Operative cultures positive for Klebsiella, patient treated with IV Rocephin.  Hospital stay has been complicated by poor p.o. intakes, ongoing encephalopathy, type 2 diabetes, AKI on maintenance fluids, and abdominal and back pain on oxycodone and tramadol due to Dilaudid causing hypotension.  On exam, patient sitting up in bedside chair, wife at bedside.  She states that the patient has remained very delirious and encephalopathic since rehab discharge, although he seems somewhat better  today.  Did prior have mitts to avoid pulling on IVs, however these were removed this morning due to causing further agitation.  She endorses eventual plan is for patient to return home, she is available 24/7 and can provide supervision contact-guard assist.  States that her son is nearby and available for physical assistance intermittently, and is also building a ramp into their home, which has a first-floor set up.  She states they are motivated for patient to return home once medically stable and able to resume therapy at inpatient rehab.  Patient, unfortunately, remains encephalopathic, perseverative on the fact that he is dead.  Unable to obtain ROS due to patient cognitive status.   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HCC)    Arthritis    "back, hips, legs" (06/10/2015)   Chest pain    Chronic lower back pain    Depression    DJD (degenerative joint disease)    Hyperlipidemia 06/11/2015   Hypertension    Hypothyroidism    Kidney stone    Kidney stone    Type II diabetes mellitus (HCC)    Past Surgical History:  Procedure Laterality Date   CARDIAC CATHETERIZATION N/A 06/11/2015   Procedure: Left Heart Cath and Coronary Angiography;  Surgeon: Lyn Records, MD;  Location: [facility] INVASIVE CV LAB;  Service: Cardiovascular;  Laterality: N/A;   COLONOSCOPY  06/21/2011   Procedure: COLONOSCOPY;  Surgeon: Malissa Hippo, MD;  Location: [facility] ENDO SUITE;  Service: Endoscopy;  Laterality: N/A;   COLONOSCOPY     COLONOSCOPY N/A 09/20/2017   Procedure: COLONOSCOPY;  Surgeon: Malissa Hippo, MD;  Location: [facility] SUITE;  Service: Endoscopy;  Laterality: N/A;  1200   CORONARY ARTERY BYPASS GRAFT N/A 06/14/2015   Procedure:  CORONARY ARTERY BYPASS GRAFTING times four using Left Internal mammary artery and right leg Saphenous vein graft;  Surgeon: Kerin Perna, MD;  Location: [facility] OR;  Service: Open Heart Surgery;  Laterality: N/A;   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    HAND SURGERY     IR PERC CHOLECYSTOSTOMY  07/03/2023   SKIN CANCER EXCISION Left    "forearm"   TEE WITHOUT CARDIOVERSION N/A 06/14/2015   Procedure: TRANSESOPHAGEAL ECHOCARDIOGRAM (TEE);  Surgeon: Kerin Perna, MD;  Location: [facility] OR;  Service: Open Heart Surgery;  Laterality: N/A;   Family History  Problem Relation Age of Onset   Stroke Mother    Hypertension Mother    Stomach cancer Father    Liver cancer Father    Cancer Father    Rectal cancer Brother    Cancer Brother    Heart attack Neg Hx    Social History:  reports that he quit smoking about 19 years ago. His smoking use included cigarettes. He started smoking about 64 years ago. He has a 90 pack-year smoking history. His smokeless tobacco use includes chew. He reports that he does not drink alcohol and does not use drugs. Allergies:  Allergies  Allergen Reactions   Penicillins Rash        Medications Prior to Admission  Medication Sig Dispense Refill   acetaminophen (TYLENOL) 325 MG tablet Take 2 tablets (650 mg total) by mouth every 4 (four) hours as needed for mild pain (or temp > 37.5 C (99.5 F)).     aspirin EC 81 MG tablet Take 1 tablet (81 mg total) by mouth daily with breakfast. -Take Aspirin 81 mg daily along with Plavix 75 mg daily for 21 days then after that STOP the Plavix  and continue ONLY Aspirin 81 mg daily indefinitely-- 30 tablet 11   atorvastatin (LIPITOR) 40 MG tablet Take 1 tablet (40 mg total) by mouth daily. 30 tablet 4   blood glucose meter kit and supplies 1 each by Other route 4 (four) times daily. Dispense based on patient and insurance preference. Use up to four times daily as directed. (FOR ICD-10 E10.9, E11.9). 1 each 0   clopidogrel (PLAVIX) 75 MG tablet Take 1 tablet (75 mg total) by mouth daily. Take Aspirin 81 mg daily along with Plavix 75 mg daily for 21 days then after that STOP the Plavix  and continue ONLY Aspirin 81 mg daily indefinitely-- 30 tablet 0   glucose blood (ONETOUCH VERIO) test  strip 1 each by Other route 4 (four) times daily. Use as instructed 150 each 5   insulin aspart (NOVOLOG) 100 UNIT/ML FlexPen Inject 0-10 Units into the skin 3 (three) times daily with meals. Short acting insulin per sliding scale 0-10 ---:-- Insulin injection 0-10 Units 0-10 Units Subcutaneous, 3 times daily with meals CBG 70 - 120: 0 unit CBG 121 - 150: 0 unit  CBG 151 - 200: 1 unit CBG 201 - 250: 2 units CBG 251 - 300: 4 units CBG 301 - 350: 6 units  CBG 351 - 400: 8 units  CBG > 400: 10 units 15 mL 11   insulin glargine, 2 Unit Dial, (TOUJEO MAX SOLOSTAR) 300 UNIT/ML Solostar Pen Inject 42 Units into the skin at bedtime. 15 mL 0   Insulin Pen Needle (B-D ULTRAFINE III SHORT PEN) 31G X 8 MM MISC 1 each by Does not apply route as directed. 100 each 3   levothyroxine (SYNTHROID) 75 MCG tablet Take 1 tablet (75 mcg  total) by mouth daily before breakfast. 90 tablet 0   losartan (COZAAR) 25 MG tablet Take 1 tablet (25 mg total) by mouth daily. 90 tablet 0   metoprolol tartrate (LOPRESSOR) 25 MG tablet Take 1 tablet (25 mg total) by mouth 2 (two) times daily. 180 tablet 0   senna-docusate (SENOKOT-S) 8.6-50 MG tablet Take 2 tablets by mouth at bedtime. 60 tablet 1   traMADol (ULTRAM) 50 MG tablet Take 1 tablet (50 mg total) by mouth every 6 (six) hours as needed. pain 30 tablet 0   traZODone (DESYREL) 100 MG tablet Take 1 tablet (100 mg total) by mouth at bedtime. 30 tablet 2    Home: Home Living Family/patient expects to be discharged to:: Private residence Living Arrangements: Spouse/significant other Available Help at Discharge: Family, Available 24 hours/day Type of Home: House Home Access: Stairs to enter [employer] of Steps: 3 Entrance Stairs-Rails: Left Home Layout: One level Bathroom Shower/Tub: Engineer, manufacturing systems: Handicapped height Bathroom Accessibility: Yes Home Equipment: None  Lives With: Spouse  Functional History: Prior Function Prior Level of  Function : Independent/Modified Independent, Driving Mobility Comments: Tourist information centre manager without AD ADLs Comments: Independent; drives Functional Status:  Mobility: Bed Mobility Overal bed mobility: Needs Assistance Bed Mobility: Supine to Sit Rolling: Mod assist Sidelying to sit: Max assist, +2 for physical assistance Supine to sit: Mod assist, HOB elevated, Used rails Sit to supine: Max assist, +2 for physical assistance General bed mobility comments: increased time, cues for sequencing, assist with LLE and trunk Transfers Overall transfer level: Needs assistance Equipment used: Ambulation equipment used Transfers: Sit to/from Stand, Bed to chair/wheelchair/BSC Sit to Stand: Max assist, +2 physical assistance Bed to/from chair/wheelchair/BSC transfer type:: Via Financial planner via Lift Equipment: Stedy General transfer comment: STS x 2 trials in stedy. Pt demo flexed posture and L lateral lean. Dependent stedy transfer bed to recliner. Maximove sling in place in recliner for return to bed, if needed. Ambulation/Gait General Gait Details: could not progress due to decreased safety due to cognition    ADL: ADL Overall ADL's : Needs assistance/impaired Eating/Feeding: Moderate assistance, Sitting Eating/Feeding Details (indicate cue type and reason): drink from cup Grooming: Wash/dry face, Maximal assistance, Sitting Upper Body Bathing: Maximal assistance, Sitting Upper Body Bathing Details (indicate cue type and reason): simulated Lower Body Bathing: Maximal assistance, +2 for physical assistance, Sit to/from stand Lower Body Bathing Details (indicate cue type and reason): simulated Lower Body Dressing: Total assistance Lower Body Dressing Details (indicate cue type and reason): gripper socks only Toilet Transfer: Maximal assistance, +2 for physical assistance Toilet Transfer Details (indicate cue type and reason): simulated standing at the EOB Toileting- Clothing  Manipulation and Hygiene: Maximal assistance, +2 for physical assistance Toileting - Clothing Manipulation Details (indicate cue type and reason): simulated standing at the EOB Functional mobility during ADLs: Maximal assistance, +2 for safety/equipment (standing EOB) General ADL Comments: Pt with decreased sustained attention and increased confusion.  Max assist for transition to EOB with overall mod assist for static sitting balance secondary to posterior lean and LOB.  Greater assist with simulated selfcare tasks this session, likely attributed to decreased cognition.  Will continue to monitor and assess in treatment.  Spouse present during session and concerned with patients confused state as it is not close to his baseline prior to surgery.  Cognition: Cognition Overall Cognitive Status: Impaired/Different from baseline Orientation Level: Oriented to person, Disoriented to place, Disoriented to time, Disoriented to situation Cognition Arousal: Alert Behavior During Therapy:  Restless, WFL for tasks assessed/performed Overall Cognitive Status: Impaired/Different from baseline Area of Impairment: Orientation, Attention, Memory, Following commands, Safety/judgement, Awareness, Problem solving Orientation Level: Disoriented to, Time, Situation Current Attention Level: Sustained Memory: Decreased short-term memory Following Commands: Follows one step commands with increased time, Follows one step commands inconsistently Safety/Judgement: Decreased awareness of safety Awareness: Intellectual Problem Solving: Slow processing, Decreased initiation, Difficulty sequencing, Requires verbal cues, Requires tactile cues General Comments: remains confused but appears to be clearing  Blood pressure (!) 129/58, pulse 81, temperature 97.9 F (36.6 C), temperature source Axillary, resp. rate 14, weight 86.9 kg, SpO2 91%. Physical Exam  PE: Constitution: Appropriate appearance for age. No apparent  distress.  Reclining in bedside chair.  Lethargic. Resp: No respiratory distress. No accessory muscle usage. on RA.  Clear to auscultation bilaterally, intermittent mild wet cough, nonproductive. Cardio: Well perfused appearance. No peripheral edema.  No murmurs, rubs, or gallops. Abdomen: Grossly distended, mildly tender on palpation in all quadrants.  Gallbladder drain in place, draining moderate amount of dark bile fluid.  Mild flank bruising bilaterally.  Small umbilical hernia. Skin: Drain site clean, without surrounding erythema or drainage.  Covered in clean gauze. Bilateral upper extremity IVs wrapped in gauze to prevent pulling.  Psych: perseverative on being dead/dying. Neuro: Oriented to self only; lethargic, cognitive delay, active hallucinations/delusions  Neurologic Exam:   DTRs: Reflexes were 2+ in bilateral achilles, patella, biceps, BR and triceps. Babinsky: flexor responses b/l.   Hoffmans: negative b/l Sensory exam: revealed normal sensation in all dermatomal regions in bilateral upper extremities and bilateral lower extremities Motor exam: strength 5/5 throughout bilateral upper extremities, right lower extremities, and with exception of left lower extremity 4+ out of 5 in hip flexion, knee extension, and knee flexion.  5 out of 5 in dorsiflexion and plantarflexion. Coordination: Fine motor coordination deficit in left upper extremity and left lower extremity.  MSK: Right fifth digit contracture   Results for orders placed or performed during the hospital encounter of 07/02/23 (from the past 24 hour(s))  Glucose, capillary     Status: None   Collection Time: 07/05/23  3:03 PM  Result Value Ref Range   Glucose-Capillary 97 70 - 99 mg/dL  Glucose, capillary     Status: Abnormal   Collection Time: 07/05/23  7:44 PM  Result Value Ref Range   Glucose-Capillary 68 (L) 70 - 99 mg/dL  Glucose, capillary     Status: None   Collection Time: 07/05/23  8:05 PM  Result Value  Ref Range   Glucose-Capillary 79 70 - 99 mg/dL  Glucose, capillary     Status: Abnormal   Collection Time: 07/05/23  8:49 PM  Result Value Ref Range   Glucose-Capillary 107 (H) 70 - 99 mg/dL  Glucose, capillary     Status: Abnormal   Collection Time: 07/05/23 11:38 PM  Result Value Ref Range   Glucose-Capillary 64 (L) 70 - 99 mg/dL  Glucose, capillary     Status: None   Collection Time: 07/05/23 11:39 PM  Result Value Ref Range   Glucose-Capillary 77 70 - 99 mg/dL  Glucose, capillary     Status: Abnormal   Collection Time: 07/06/23 12:11 AM  Result Value Ref Range   Glucose-Capillary 67 (L) 70 - 99 mg/dL  Glucose, capillary     Status: None   Collection Time: 07/06/23 12:24 AM  Result Value Ref Range   Glucose-Capillary 86 70 - 99 mg/dL  Glucose, capillary     Status: None  Collection Time: 07/06/23  3:25 AM  Result Value Ref Range   Glucose-Capillary 89 70 - 99 mg/dL  CBC     Status: Abnormal   Collection Time: 07/06/23  7:46 AM  Result Value Ref Range   WBC 16.5 (H) 4.0 - 10.5 K/uL   RBC 4.11 (L) 4.22 - 5.81 MIL/uL   Hemoglobin 12.5 (L) 13.0 - 17.0 g/dL   HCT 16.1 (L) 09.6 - 04.5 %   MCV 91.5 80.0 - 100.0 fL   MCH 30.4 26.0 - 34.0 pg   MCHC 33.2 30.0 - 36.0 g/dL   RDW 40.9 81.1 - 91.4 %   Platelets 198 150 - 400 K/uL   nRBC 0.0 0.0 - 0.2 %  Basic metabolic panel     Status: Abnormal   Collection Time: 07/06/23  7:46 AM  Result Value Ref Range   Sodium 135 135 - 145 mmol/L   Potassium 3.9 3.5 - 5.1 mmol/L   Chloride 101 98 - 111 mmol/L   CO2 27 22 - 32 mmol/L   Glucose, Bld 155 (H) 70 - 99 mg/dL   BUN 44 (H) 8 - 23 mg/dL   Creatinine, Ser 7.82 (H) 0.61 - 1.24 mg/dL   Calcium 8.2 (L) 8.9 - 10.3 mg/dL   GFR, Estimated 40 (L) >60 mL/min   Anion gap 7 5 - 15  Glucose, capillary     Status: Abnormal   Collection Time: 07/06/23  8:20 AM  Result Value Ref Range   Glucose-Capillary 164 (H) 70 - 99 mg/dL  Glucose, capillary     Status: Abnormal   Collection Time:  07/06/23 11:43 AM  Result Value Ref Range   Glucose-Capillary 143 (H) 70 - 99 mg/dL   No results found.  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 Yes Co-Morbidities requiring supervision/potential complications: Recent sepsis from acute cholecystitis status post drain placement, encephalopathy/delirium secondary to sepsis and medications, constipation, urinary incontinence, type 2 diabetes complicated by poor p.o. intakes, prerenal AKI, and poor pain control Due to bladder management, bowel management, safety, skin/wound care, disease management, medication administration, pain management, and patient education, does the patient require 24 hr/day rehab nursing? Yes Does the patient require coordinated care of a physician, rehab nurse, therapy disciplines of PT, OT, and SLP to address physical and functional deficits in the context of the above medical diagnosis(es)? Yes Addressing deficits in the following areas: balance, endurance, locomotion, strength, transferring, bowel/bladder control, bathing, dressing, feeding, grooming, toileting, and cognition Can the patient actively participate in an intensive therapy program of at least 3 hrs of therapy per day at least 5 days per week? Potentially The potential for patient to make measurable gains while on inpatient rehab is good Anticipated functional outcomes upon discharge from inpatient rehab are supervision  with PT, supervision with OT, independent with SLP. Estimated rehab length of stay to reach the above functional goals is: 10-14 days Anticipated discharge destination: Home Overall Rehab/Functional Prognosis: good  POST ACUTE RECOMMENDATIONS: This patient's condition is appropriate for continued rehabilitative care in the  following setting:  TBD, CIR vs.  SNF Patient has agreed to participate in recommended program. N/A; patient does not currently have decision-making capacity Note that insurance prior authorization may be required for reimbursement for recommended care.  Comment: Prior to acute hospitalization for cholecystitis and  sepsis, patient was making excellent gains at inpatient rehab and participating well.  Given that his current cognitive deficits started with sepsis and MRI did not show any new neurologic injury, anticipate that patient's cognition should improve back to a level where he can participate meaningfully with therapies and progress towards discharge home in the next few days.  Therefore, pending medical stability and continued cognitive recovery, tentatively recommend return to CIR.    Patient's wife has COPD and endorses that she can handle the patient at a supervision to contact-guard level, with intermittent physical assistance from her son who lives nearby.  If patient does not make meaningful cognitive recovery, he would exceed caregiver availability in his current home and would be more appropriate for SNF level.   MEDICAL RECOMMENDATIONS: Recommend chest x-ray and speech therapy evaluation given ongoing wet cough and encephalopathy, which may be causing aspiration Agree with discontinuation of mitts, limit physical restraints due to risk of increasing delirium. For delirium management, discussed with wife daytime stimulation and reorientation, recommend limiting lights and call bell's at nighttime.   Recommend daily out of bed to chair for at least 30 minutes to prevent deconditioning  I have personally performed a face to face diagnostic evaluation of this patient. Additionally, I have examined the patient's medical record including any pertinent labs and radiographic images. If the physician assistant has documented in this note, I have reviewed and edited or otherwise concur with the  physician assistant's documentation.  Thanks,  [name], DO 07/06/2023

## 2023-07-06 NOTE — Progress Notes (Signed)
[facility]  PROGRESS NOTE  Hunter Sparks:[id number] DOB: November 29, 1945 DOA: 07/02/2023 PCP: Benita Stabile, MD   Brief HPI:   Recently discharged from the hospital will on June 23, 2023 after spending 2 days in the hospital for an acute stroke. 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 He has had left hemiparesis since then. D/c'd to CIR Patient was at rehab where he complained of severe abdominal pain for 2 days.  WBC was elevated 22.5.  He was sent to the hospital for further workup.  Right upper quadrant ultrasound showed acute cholecystitis.  General surgery was consulted and recommended IR place drain.  Drain placed on 8/20.    Assessment/Plan:   Acute cholecystitis -General Surgery consulted, cholecystostomy drain recommended per IR -High risk for surgery given being on Plavix and recent stroke -Antibiotics changed based on culture: Klebsiella -Will need outpatient follow-up with general surgery to discuss timing of cholecystectomy -diet started  Sepsis due to acute cholecystitis -Presented with sepsis physiology due to acute cholecystitis, -Initial lactic acid 2.9; improved to 1.2 -BP low after IV dilaudid- avoid  Hemiparesis affecting left side as effect of CVA -Recent CVA causing left hemiparesis -? back to the inpatient rehab once medically stable -Continue Plavix, aspirin  Diabetes mellitus type 2 -SSI --hold insulin due to low BS  CAD -Continue aspirin, Lipitor, Lopressor  Hypothyroidism -Continue Synthroid  AKI -appears pre-renal -daily labs -IVF  Delirium -precautions -trial of mittens removal   Subjective   Periods of confusion and agitation-- does not like mittens     Data Reviewed:   CBG:  Recent Labs  Lab 07/05/23 2339 07/06/23 0011 07/06/23 0024 07/06/23 0325  07/06/23 0820  GLUCAP 77 67* 86 89 164*    SpO2: 91 % O2 Flow Rate (L/min): 2 L/min    Vitals:   07/06/23 0321 07/06/23 0349 07/06/23 0600 07/06/23 0821  BP: (!) 151/63   (!) 129/58  Pulse: 76  86 81  Resp: 11   14  Temp: 98.4 F (36.9 C)   97.9 F (36.6 C)  TempSrc: Axillary   Axillary  SpO2: 95%   91%  Weight:  86.9 kg        General: Appearance:     Overweight male in no acute distress     Lungs:     respirations unlabored  Heart:    Normal heart rate.  MS:   All extremities are intact.   Neurologic:   Awake, alert       Data Reviewed:  Basic Metabolic Panel: Recent Labs  Lab 07/02/23 1619 07/03/23 0249 07/04/23 0425 07/05/23 0437 07/06/23 0746  NA 133* 133* 134* 135 135  K 4.3 4.7 4.2 3.9 3.9  CL 95* 100 98 99 101  CO2 25 26 25 22 27   GLUCOSE 176* 180* 144* 112* 155*  BUN 21 22 37* 50* 44*  CREATININE 1.30* 1.30* 2.54* 2.34* 1.74*  CALCIUM 9.1 8.3* 7.9* 8.0* 8.2*  MG  --  1.9  --   --   --     CBC: Recent Labs  Lab 07/02/23 0520 07/02/23 1619 07/03/23 0249 07/04/23 0425 07/05/23 0710 07/06/23 0746  WBC 16.1* 22.5* 26.3* 24.2* 19.3* 16.5*  NEUTROABS 12.7*  --  21.8*  --   --   --   HGB 16.4 17.5* 14.4 12.8* 13.6 12.5*  HCT  46.2 50.5 41.6 36.9* 39.9 37.6*  MCV 90.6 92.0 89.3 92.9 94.1 91.5  PLT 231 219 227 175 189 198    LFT Recent Labs  Lab 07/02/23 0520 07/03/23 0249 07/04/23 0425 07/05/23 0437  AST 20 20 99* 114*  ALT 22 16 54* 51*  ALKPHOS 87 65 138* 106  BILITOT 0.9 1.4* 0.8 0.8  PROT 7.6 6.2* 5.2* 5.8*  ALBUMIN 3.7 2.8* 2.1* 2.4*     Antibiotics: Anti-infectives (From admission, onward)    Start     Dose/Rate Route Frequency Ordered Stop   07/04/23 1445  cefTRIAXone (ROCEPHIN) 2 g in sodium chloride 0.9 % 100 mL IVPB        2 g 200 mL/hr over 30 Minutes Intravenous Every 24 hours 07/04/23 1356     07/03/23 1215  cefOXitin (MEFOXIN) 2 g in sodium chloride 0.9 % 100 mL IVPB        2 g 200 mL/hr over 30 Minutes  Intravenous To Radiology 07/03/23 1116 07/03/23 1508   07/03/23 1130  ciprofloxacin (CIPRO) IVPB 400 mg  Status:  Discontinued        400 mg 200 mL/hr over 60 Minutes Intravenous Every 12 hours 07/03/23 1043 07/04/23 1356   07/03/23 0800  metroNIDAZOLE (FLAGYL) IVPB 500 mg  Status:  Discontinued        500 mg 100 mL/hr over 60 Minutes Intravenous Every 12 hours 07/03/23 0052 07/04/23 1356   07/03/23 0600  cefTRIAXone (ROCEPHIN) 2 g in sodium chloride 0.9 % 100 mL IVPB  Status:  Discontinued        2 g 200 mL/hr over 30 Minutes Intravenous Every 24 hours 07/03/23 0052 07/03/23 1043        DVT prophylaxis: Heparin  Code Status: Full code  Family Communication: wife at bedisde   CONSULTS  General Surgery         Joseph Art   [facility] If 7PM-7AM, please contact night-coverage at [email or web address],   07/06/2023, 11:12 AM  LOS: 4 days

## 2023-07-06 NOTE — Plan of Care (Signed)
  Problem: Education: Goal: Knowledge of disease or condition will improve Outcome: Progressing   Problem: Self-Care: Goal: Ability to communicate needs accurately will improve Outcome: Progressing   Problem: Nutrition: Goal: Risk of aspiration will decrease Outcome: Progressing Goal: Dietary intake will improve Outcome: Progressing   Problem: Clinical Measurements: Goal: Respiratory complications will improve Outcome: Progressing   Problem: Activity: Goal: Risk for activity intolerance will decrease Outcome: Progressing   Problem: Pain Managment: Goal: General experience of comfort will improve Outcome: Progressing

## 2023-07-06 NOTE — Progress Notes (Signed)
Referring Physician(s): [name]  Supervising Physician: Ruel Favors  Patient Status:  [facility] - In-pt  Chief Complaint: Acute cholecystitis s/p percutaneous cholecystostomy 07/03/23  Subjective: Patient sitting up in bed having just finished lunch. His wife is at the bedside; RN is in the room. Patient is complaining of abdominal pain likely secondary to gas/bloating.   Allergies: Penicillins  Medications: Prior to Admission medications   Medication Sig Start Date End Date Taking? Authorizing Provider  acetaminophen (TYLENOL) 325 MG tablet Take 2 tablets (650 mg total) by mouth every 4 (four) hours as needed for mild pain (or temp > 37.5 C (99.5 F)). 06/23/23   Shon Hale, MD  aspirin EC 81 MG tablet Take 1 tablet (81 mg total) by mouth daily with breakfast. -Take Aspirin 81 mg daily along with Plavix 75 mg daily for 21 days then after that STOP the Plavix  and continue ONLY Aspirin 81 mg daily indefinitely-- 06/23/23   Shon Hale, MD  atorvastatin (LIPITOR) 40 MG tablet Take 1 tablet (40 mg total) by mouth daily. 06/24/23   Shon Hale, MD  blood glucose meter kit and supplies 1 each by Other route 4 (four) times daily. Dispense based on patient and insurance preference. Use up to four times daily as directed. (FOR ICD-10 E10.9, E11.9). 01/16/20   Roma Kayser, MD  clopidogrel (PLAVIX) 75 MG tablet Take 1 tablet (75 mg total) by mouth daily. Take Aspirin 81 mg daily along with Plavix 75 mg daily for 21 days then after that STOP the Plavix  and continue ONLY Aspirin 81 mg daily indefinitely-- 06/24/23   Shon Hale, MD  glucose blood (ONETOUCH VERIO) test strip 1 each by Other route 4 (four) times daily. Use as instructed 10/27/19   Roma Kayser, MD  insulin aspart (NOVOLOG) 100 UNIT/ML FlexPen Inject 0-10 Units into the skin 3 (three) times daily with meals. Short acting insulin per sliding scale 0-10 ---:-- Insulin injection 0-10 Units 0-10 Units  Subcutaneous, 3 times daily with meals CBG 70 - 120: 0 unit CBG 121 - 150: 0 unit  CBG 151 - 200: 1 unit CBG 201 - 250: 2 units CBG 251 - 300: 4 units CBG 301 - 350: 6 units  CBG 351 - 400: 8 units  CBG > 400: 10 units 06/23/23   Emokpae, Courage, MD  insulin glargine, 2 Unit Dial, (TOUJEO MAX SOLOSTAR) 300 UNIT/ML Solostar Pen Inject 42 Units into the skin at bedtime. 06/23/23   Emokpae, Courage, MD  Insulin Pen Needle (B-D ULTRAFINE III SHORT PEN) 31G X 8 MM MISC 1 each by Does not apply route as directed. 10/27/19   Roma Kayser, MD  levothyroxine (SYNTHROID) 75 MCG tablet Take 1 tablet (75 mcg total) by mouth daily before breakfast. 06/23/23   Shon Hale, MD  losartan (COZAAR) 25 MG tablet Take 1 tablet (25 mg total) by mouth daily. 06/19/23   Antoine Poche, MD  metoprolol tartrate (LOPRESSOR) 25 MG tablet Take 1 tablet (25 mg total) by mouth 2 (two) times daily. 06/19/23   Antoine Poche, MD  senna-docusate (SENOKOT-S) 8.6-50 MG tablet Take 2 tablets by mouth at bedtime. 06/23/23   Shon Hale, MD  traMADol (ULTRAM) 50 MG tablet Take 1 tablet (50 mg total) by mouth every 6 (six) hours as needed. pain 06/19/15   Barrett, Erin R, PA-C  traZODone (DESYREL) 100 MG tablet Take 1 tablet (100 mg total) by mouth at bedtime. 06/23/23   Shon Hale, MD  Vital Signs: BP (!) 129/58 (BP Location: Right Arm)   Pulse 81   Temp 97.9 F (36.6 C) (Axillary)   Resp 14   Wt 191 lb 9.3 oz (86.9 kg)   SpO2 91%   BMI 25.98 kg/m   Physical Exam Constitutional:      General: He is not in acute distress.    Appearance: He is not ill-appearing.  Pulmonary:     Effort: Pulmonary effort is normal.  Abdominal:     General: There is distension.     Tenderness: There is no abdominal tenderness.     Comments: RUQ drain to gravity. Approximately 100 ml of bile in bag. Dressing is clean and dry. Site is non-tender.   Skin:    General: Skin is warm and dry.  Neurological:     Mental  Status: He is alert. He is disoriented.     Imaging: IR Perc Cholecystostomy  Result Date: 07/03/2023 INDICATION: 77 year old male referred for percutaneous cholecystostomy EXAM: IMAGE GUIDED PERCUTANEOUS CHOLECYSTOSTOMY MEDICATIONS: 15 mg IV Toradol Cefoxitin; The antibiotic was administered within an appropriate time frame prior to the initiation of the procedure. ANESTHESIA/SEDATION: Moderate (conscious) sedation was employed during this procedure. A total of Versed 0.5 mg and Fentanyl 75 mcg was administered intravenously by the radiology nurse. Total intra-service moderate Sedation Time: 10 minutes. The patient's level of consciousness and vital signs were monitored continuously by radiology nursing throughout the procedure under my direct supervision. FLUOROSCOPY: Radiation Exposure Index (as provided by the fluoroscopic device): 6 mGy Kerma COMPLICATIONS: None PROCEDURE: Informed written consent was obtained from the patient and the patient's family after a thorough discussion of the procedural risks, benefits and alternatives. All questions were addressed. Maximal Sterile Barrier Technique was utilized including caps, mask, sterile gowns, sterile gloves, sterile drape, hand hygiene and skin antiseptic. A timeout was performed prior to the initiation of the procedure. Ultrasound survey of the right upper quadrant was performed for planning purposes. Once the patient is prepped and draped in the usual sterile fashion, the skin and subcutaneous tissues overlying the gallbladder were generously infiltrated 1% lidocaine for local anesthesia. A coaxial needle was advanced under ultrasound guidance through the skin subcutaneous tissues and a small segment of liver into the gallbladder lumen. With removal of the stylet, spontaneous dark bile drainage occurred. Using modified Seldinger technique, a 10 French drain was placed into the gallbladder fossa, with aspiration of the sample for the lab. Contrast  injection confirmed position of the tube within the gallbladder lumen. Drainage catheter was attached to gravity drain with a suture retention placed. Patient tolerated the procedure well and remained hemodynamically stable throughout. No complications were encountered and no significant blood loss encountered. IMPRESSION: Status post image guided percutaneous cholecystostomy Signed, Yvone Neu. Miachel Roux, RPVI [facility] Electronically Signed   By: Gilmer Mor D.O.   On: 07/03/2023 16:13   US Abdomen Limited RUQ (LIVER/GB)  Addendum Date: 07/03/2023   ADDENDUM REPORT: 07/03/2023 00:54 ADDENDUM: Critical Value/emergent results were called by telephone at the time of interpretation on 07/03/2023 at 12:52 am to provider DR. CHEN, who verbally acknowledged these results. Electronically Signed   By: Almira Bar M.D.   On: 07/03/2023 00:54   Result Date: 07/03/2023 CLINICAL DATA:  [id number].  Right upper quadrant abdominal pain. EXAM: ULTRASOUND ABDOMEN LIMITED RIGHT UPPER QUADRANT COMPARISON:  CT without contrast 12/03/2021 FINDINGS: Gallbladder: There is a hydropic dilated gallbladder measuring 13 cm in length. There is thickening  of the free wall to 4 mm, positive sonographic Murphy's sign, gallbladder sludge, and layering stones up to 9 mm. Trace pericholecystic fluid is also noted. Findings consistent with acute cholecystitis. Common bile duct: Diameter: 5.8 mm.  No intrahepatic biliary prominence. Liver: No focal lesion identified. There is mild increased parenchymal echogenicity consistent with fatty replacement. Portal vein is patent on color Doppler imaging with normal direction of blood flow towards the liver. Other: None. IMPRESSION: 1. Cholelithiasis, and additional findings consistent with acute cholecystitis. 2. No biliary ductal dilatation. 3. Mild fatty replacement of the liver. Electronically Signed: By: Almira Bar M.D. On:  07/03/2023 00:41   DG Chest 2 View  Result Date: 07/02/2023 CLINICAL DATA:  Leukocytosis. EXAM: CHEST - 2 VIEW COMPARISON:  None Available. FINDINGS: The heart size and mediastinal contours are within normal limits. Both lungs are clear. The visualized skeletal structures are unremarkable. IMPRESSION: No active cardiopulmonary disease. Electronically Signed   By: Marin Roberts M.D.   On: 07/02/2023 18:06    Labs:  CBC: Recent Labs    07/03/23 0249 07/04/23 0425 07/05/23 0710 07/06/23 0746  WBC 26.3* 24.2* 19.3* 16.5*  HGB 14.4 12.8* 13.6 12.5*  HCT 41.6 36.9* 39.9 37.6*  PLT 227 175 189 198    COAGS: Recent Labs    06/21/23 0041 07/03/23 1116  INR 0.9 1.3*  APTT 26  --     BMP: Recent Labs    07/03/23 0249 07/04/23 0425 07/05/23 0437 07/06/23 0746  NA 133* 134* 135 135  K 4.7 4.2 3.9 3.9  CL 100 98 99 101  CO2 26 25 22 27   GLUCOSE 180* 144* 112* 155*  BUN 22 37* 50* 44*  CALCIUM 8.3* 7.9* 8.0* 8.2*  CREATININE 1.30* 2.54* 2.34* 1.74*  GFRNONAA 56* 25* 28* 40*    LIVER FUNCTION TESTS: Recent Labs    07/02/23 0520 07/03/23 0249 07/04/23 0425 07/05/23 0437  BILITOT 0.9 1.4* 0.8 0.8  AST 20 20 99* 114*  ALT 22 16 54* 51*  ALKPHOS 87 65 138* 106  PROT 7.6 6.2* 5.2* 5.8*  ALBUMIN 3.7 2.8* 2.1* 2.4*    Assessment and Plan:   Acute cholecystitis s/p percutaneous cholecystostomy 07/03/23  Drain Location: RUQ Size: Fr size: 10 Fr Date of placement: 07/03/23  Currently to: Drain collection device: gravity 24 hour output:  Output by Drain (mL) 07/04/23 0700 - 07/04/23 1459 07/04/23 1500 - 07/04/23 2259 07/04/23 2300 - 07/05/23 0659 07/05/23 0700 - 07/05/23 1459 07/05/23 1500 - 07/05/23 2259 07/05/23 2300 - 07/06/23 0659 07/06/23 0700 - 07/06/23 1342  Biliary Tube Cook slip-coat 10.2 Fr. RUQ 100 25 75 100  150     Interval imaging/drain manipulation:  none  Current examination: Flushed today by RN without difficulty Insertion site  unremarkable. Suture and stat lock in place. Dressed appropriately.   Discharge planning: Percutaneous cholecystostomy drain to remain in place at least 6 weeks. Recommend fluoroscopy with injection of the drain in IR to evaluate for patency of the cystic duct. IR will follow up with the patient in approximately 6 weeks. A scheduler from our office will call the patient with a date/time of his appointment.  If the duct is patent and general surgery feels patient is stable for cholecystectomy, the drain would be removed at time of surgery. If the duct is patent and general surgery feels patient is NEVER a candidate for cholecystectomy, drain can be capped for a trial. If symptoms recur, then place to gravity bag again. If  trial is successful, discuss possible removal of the drain.  IR will continue to follow - please call with questions or concerns.  Electronically Signed: Alwyn Ren, AGACNP-BC [phone] 07/06/2023, 12:55 PM   I spent a total of 15 Minutes at the the patient's bedside AND on the patient's hospital floor or unit, greater than 50% of which was counseling/coordinating care for percutaneous cholecystostomy

## 2023-07-06 NOTE — Progress Notes (Signed)
Initial Nutrition Assessment  DOCUMENTATION CODES:   Severe malnutrition in context of acute illness/injury  INTERVENTION:   - Ensure Enlive po TID, each supplement provides 350 kcal and 20 grams of protein  - Magic Cup TID with meals, each supplement provides 290 kcal and 9 grams of protein  - Recommend adding simethicone PRN due to bloating, gas, and flatulence  - Changed pt from automatic meal trays to "with assist" so that pt can receive assistance in meal ordering  - Encourage PO intake and provide feeding assistance as needed  NUTRITION DIAGNOSIS:   Severe Malnutrition related to acute illness (recent CVA, sepsis, acute cholecystitis) as evidenced by mild fat depletion, moderate muscle depletion, percent weight loss (11.3% weight loss in < 1 month).  GOAL:   Patient will meet greater than or equal to 90% of their needs  MONITOR:   PO intake, Supplement acceptance, Labs, Weight trends, I & O's  REASON FOR ASSESSMENT:   Rounds    ASSESSMENT:   77 year old male who presented from CIR on 8/19 with abdominal pain and leukocytosis. Pt originally discharged from the hospital on 06/23/23 after being admitted for 2 days due to CVA. PMH of CKD stage IIIa, T2DM, CAD, HTN. Pt admitted with sepsis due to acute cholecystitis.  08/20 - s/p perc chole drain placement by IR  RD was made aware by Charge RN during Progression that pt has not been eating. Meal completions have ranged from 0-25% on a Regular diet.  Spoke with pt and wife at bedside. Pt's wife confirms that pt has been barely eating since being transferred back to acute care. She states that pt was eating well at CIR but hated the Heart Healthy diet. She also reports pt was drinking Ensure supplements while at CIR.  When RD asked pt why he wasn't eating, he reports that he really does not want to eat due to stomach pain, flatulence, and lack of appetite. Pt later states that his stomach does not hurt and that his bottom  and legs are what hurts. RD provided pt with a chocolate Ensure at time of visit. Pt consumed 100% of the supplement during RD visit. Pt was very gassy and bloated, and RD could discern audible bowel sounds from across the room. Discussed with MD and recommended PRN simethicone.  Pt's wife reports that pt has lost a noticeable amount of weight recently. She states that is UBW is over 200 lbs. Reviewed weight history in chart. Pt with an 11.1 kg weight loss in less than 1 month. This is an 11.3% weight loss which is severe and significant for timeframe. Based on weight loss and NFPE, pt meets criteria for severe acute malnutrition.  Pt willing to try Magic Cups in addition to Ensure supplements. RD to order. Discussed potential need for nutrition support unless pt able to take in more POs. Wife reports that pt has been receiving foods that he does not like. Noted that pt is receiving automatic meal trays; changed to "with assist" so that wife could participate in meal ordering.  Medications reviewed and include: SSI every 4 hours, protonix, IV abx IVF: LR @ 50 ml/hr  Labs reviewed: BUN 44, creatinine 1.74, WBC 16.5 CBG's: 64-164 x 24 hours  UOP: 850 ml x 24 hours Perc chole drain: 250 ml x 24 hours I/O's: +2.8 L since admit  NUTRITION - FOCUSED PHYSICAL EXAM:  Flowsheet Row Most Recent Value  Orbital Region Mild depletion  Upper Arm Region Mild depletion  Thoracic and  Lumbar Region No depletion  Buccal Region No depletion  Temple Region Mild depletion  Clavicle Bone Region Mild depletion  Clavicle and Acromion Bone Region Moderate depletion  Scapular Bone Region Mild depletion  Dorsal Hand Mild depletion  Patellar Region Mild depletion  Anterior Thigh Region Moderate depletion  Posterior Calf Region Moderate depletion  Edema (RD Assessment) None  Hair Reviewed  Eyes Reviewed  Mouth Reviewed  Skin Reviewed  Nails Reviewed    Diet Order:   Diet Order             Diet regular  Room service appropriate? Yes with Assist; Fluid consistency: Thin  Diet effective now                   EDUCATION NEEDS:   Education needs have been addressed  Skin:  Skin Assessment: Reviewed RN Assessment  Last BM:  07/06/23 small type 6  Height:   Ht Readings from Last 1 Encounters:  06/23/23 6' (1.829 m)    Weight:   Wt Readings from Last 1 Encounters:  07/06/23 86.9 kg    BMI:  Body mass index is 25.98 kg/m.  Estimated Nutritional Needs:   Kcal:  2000-2200  Protein:  100-120 grams  Fluid:  >2.0 L    Mertie Clause, MS, RD, LDN Registered Dietitian II Please see AMiON for contact information.

## 2023-07-06 NOTE — PMR Pre-admission (Signed)
PMR Admission Coordinator Pre-Admission Assessment  Patient: Hunter Sparks is an 77 y.o., male MRN: [id number] DOB: 03-08-1946 Height:   Weight: 88.8 kg              Insurance Information HMO: yes    PPO:      PCP:      IPA:      80/20:      OTHER:  PRIMARY: [employer]      Policy#: [id number]      Subscriber: pt CM Name: Tammy      Phone#: [phone]     Fax#: [phone]/EMR access Pre-Cert#: [id number] approved after peer to peer  on 8/28 for 7 days 07/14/23 until 07/21/23     Employer:  Benefits:  Phone #: [phone]     Name: 8/9 Eff. Date: 11/13/22     Deduct: none      Out of Pocket Max: $3500      Life Max: none  CIR: $225 co pay per day days 1 until 6      SNF: no co pay per day days 1 until 20 $203 co pay per day days 21 until 100 Outpatient: $15 per visit     Co-Pay: visits per medical neccesity Home Health: 80%      Co-Pay: 20% DME: 80%     Co-Pay: 20% Providers: in network  SECONDARY: [employer]      Policy#: [id number]  Financial Counselor:       Phone#:   The "Data Collection Information Summary" for patients in Inpatient Rehabilitation Facilities with attached "Privacy Act Statement-Health Care Records" was provided and verbally reviewed with: Family  Emergency Contact Information Contact Information     Name Relation Home Work [place]   [phone]      Other Contacts     Name Relation Home Work Mobile   [place] Son   [phone]   Mussie, Adger   [phone]      Current Medical History  Patient Admitting Diagnosis: Sepsis related to acute cholecystitis, CVA  History of Present Illness: 77 y.o. right-handed male with history of hypertension hyperlipidemia insulin-dependent diabetes mellitus CAD, CKD stage III BPH hypothyroidism admitted 06/21/2023 after being found with slurred speech and left-sided weakness.  Patient had stroke workup MRI showed scattered small infarct right basal ganglia,  frontal, parietal, occipital lobes-MCA/PCA watershed distribution.  Patient was seen by neurology services and placed on low-dose aspirin and Plavix x 3 weeks then aspirin alone.  Close monitoring of permissive hypertension. Admitted to CIR 8/10.  Follow-up MRI due to some questionable cognitive changes showed no acute changes. Remained on low-dose aspirin and Plavix x 3 weeks then aspirin alone. Pain management use of tramadol discontinued due to some cognitive changes. He was using trazodone as scheduled from prior to admission for sleep. CAD/hypertension permissive hypertension he did require hydralazine as needed slow reintroduction of metoprolol. CKD stage III creatinine baseline 1.2-1.3 due to some ongoing bouts of nausea vomiting was placed on IV fluids for monitoring of hydration. Blood sugars hemoglobin A1c 8.8 insulin therapy as directed. Hypothyroidism with Synthroid replacement. Grade 1 diastolic dysfunction monitoring for any signs of fluid overload. On 07/02/2023 findings of some cognitive changes with nausea vomiting right upper quadrant discomfort. Low-grade fever 99.1 WBC 16,100 lactic acid elevated 2.9 with follow-up WBC of 22,500. He did have some hyponatremia workup ongoing. Blood cultures were pending. Due to these ongoing medical changes hospitalist team was consulted patient was discharged to  acute care services for workup of gallbladder as well as possible sepsis on 07/02/23.  WBC elevated at 22.5. Right upper quadrant Ultrasound showed acute cholecystitis. General surgery consulted and recommended IR place drain. High risk for surgery given Plavix and recent CVA.  Drain was placed on 8/20. Antibiotics changed based on culture; Klebsiella. BP issues due to Dilaudid for pain. Delirium precautions with trial of mittens.  Changed from Ceftriaxone to linezolid. Will need to follow up with general surgery to discuss timing of cholecystectomy. Diet advanced as concern for ileus resolved.    Palliative consulted for GOC, but wife feels they can make decisions on their own. No longer wants palliative involvement.   Complete NIHSS TOTAL: 3 Glasgow Coma Scale Score: 15  Patient's medical record from [facility] has been reviewed by the rehabilitation admission coordinator and physician.  Past Medical History  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HCC)    Arthritis    "back, hips, legs" (06/10/2015)   Chest pain    Chronic lower back pain    Depression    DJD (degenerative joint disease)    Hyperlipidemia 06/11/2015   Hypertension    Hypothyroidism    Kidney stone    Kidney stone    Type II diabetes mellitus (HCC)    Has the patient had major surgery during 100 days prior to admission? Yes  Family History  family history includes Cancer in his brother and father; Hypertension in his mother; Liver cancer in his father; Rectal cancer in his brother; Stomach cancer in his father; Stroke in his mother.  Current Medications   Current Facility-Administered Medications:    0.9 %  sodium chloride infusion, , Intravenous, PRN, Joseph Art, DO, Last Rate: 10 mL/hr at 07/10/23 1543, New Bag at 07/10/23 1543   acetaminophen (TYLENOL) tablet 1,000 mg, 1,000 mg, Oral, TID, Vann, Jessica U, DO, 1,000 mg at 07/13/23 0844   alum & mag hydroxide-simeth (MAALOX/MYLANTA) 200-200-20 MG/5ML suspension 15 mL, 15 mL, Oral, Q6H PRN, Vann, Jessica U, DO, 15 mL at 07/09/23 9562   aspirin EC tablet 81 mg, 81 mg, Oral, Q breakfast, Carollee Herter, DO, 81 mg at 07/13/23 1308   bisacodyl (DULCOLAX) suppository 10 mg, 10 mg, Rectal, Daily PRN, Vann, Jessica U, DO   clopidogrel (PLAVIX) tablet 75 mg, 75 mg, Oral, Daily, Carollee Herter, DO, 75 mg at 07/13/23 0844   feeding supplement (ENSURE ENLIVE / ENSURE PLUS) liquid 237 mL, 237 mL, Oral, TID BM, Vann, Jessica U, DO, 237 mL at 07/13/23 0847   heparin injection 5,000 Units, 5,000 Units, Subcutaneous, Q8H, Carollee Herter, DO,  5,000 Units at 07/13/23 0543   hydrALAZINE (APRESOLINE) injection 10 mg, 10 mg, Intravenous, Q6H PRN, Vann, Jessica U, DO, 10 mg at 07/11/23 0342   insulin aspart (novoLOG) injection 0-5 Units, 0-5 Units, Subcutaneous, QHS, Vann, Jessica U, DO, 2 Units at 07/11/23 2117   insulin aspart (novoLOG) injection 0-9 Units, 0-9 Units, Subcutaneous, TID WC, Vann, Jessica U, DO, 1 Units at 07/13/23 0844   levofloxacin (LEVAQUIN) tablet 250 mg, 250 mg, Oral, Q24H, Osvaldo Shipper, MD, 250 mg at 07/13/23 0546   levothyroxine (SYNTHROID) tablet 75 mcg, 75 mcg, Oral, Q0600, Carollee Herter, DO, 75 mcg at 07/13/23 0546   lidocaine (LIDODERM) 5 % 1 patch, 1 patch, Transdermal, Q24H, Vann, Jessica U, DO, 1 patch at 07/10/23 1423   metoprolol tartrate (LOPRESSOR) tablet 50 mg, 50 mg, Oral, BID, Vann, Jessica U, DO, 50 mg  at 07/13/23 0844   OLANZapine (ZYPREXA) tablet 5 mg, 5 mg, Oral, Daily, Ferolito, Albertina Sen[place], NP, 5 mg at 07/13/23 0844   ondansetron [facility]) tablet 4 mg, 4 mg, Oral, Q6H PRN, 4 mg at 07/04/23 0909 **OR** ondansetron (ZOFRAN) injection 4 mg, 4 mg, Intravenous, Q6H PRN, Carollee Herter, DO, 4 mg at 07/05/23 0324   pantoprazole (PROTONIX) EC tablet 40 mg, 40 mg, Oral, Daily, Vann, Jessica U, DO, 40 mg at 07/13/23 0844   simethicone (MYLICON) chewable tablet 80 mg, 80 mg, Oral, QID PRN, Marlin Canary U, DO, 80 mg at 07/06/23 1707   sodium chloride flush (NS) 0.9 % injection 5 mL, 5 mL, Intracatheter, Q8H, Wagner, Jaime, DO, 5 mL at 07/13/23 0548   traMADol (ULTRAM) tablet 50 mg, 50 mg, Oral, TID, Ernie Avena, NP, 50 mg at 07/13/23 0844   traZODone (DESYREL) tablet 100 mg, 100 mg, Oral, QHS, Carollee Herter, DO, 100 mg at 07/12/23 2103  Patients Current Diet:  Diet Order             DIET DYS 3 Room service appropriate? Yes with Assist; Fluid consistency: Thin  Diet effective now                  Precautions / Restrictions Precautions Precautions: Fall, Other (comment) Precaution Comments: left  hemiparesis Restrictions Weight Bearing Restrictions: No Other Position/Activity Restrictions: Rt sided percutaneous drain   Has the patient had 2 or more falls or a fall with injury in the past year?No  Prior Activity Level Community (5-7x/wk): independent, active and driving, retired  Prior Functional Level Prior Function Prior Level of Function : Independent/Modified Independent, Driving Mobility Comments: Tourist information centre manager without AD ADLs Comments: Independent; drives  Self Care: Did the patient need help bathing, dressing, using the toilet or eating?  Independent  Indoor Mobility: Did the patient need assistance with walking from room to room (with or without device)? Independent  Stairs: Did the patient need assistance with internal or external stairs (with or without device)? Independent  Functional Cognition: Did the patient need help planning regular tasks such as shopping or remembering to take medications? Independent  Patient Information Are you of Hispanic, Latino/a,or Spanish origin?: X. Patient unable to respond (wife states no) What is your race?: X. Patient unable to respond (wife states white) Do you need or want an interpreter to communicate with a doctor or health care staff?: 9. Unable to respond (wife states no)  Patient's Response To:  Health Literacy and Transportation Is the patient able to respond to health literacy and transportation needs?: No Health Literacy - How often do you need to have someone help you when you read instructions, pamphlets, or other written material from your doctor or pharmacy?: Patient unable to respond In the past 12 months, has lack of transportation kept you from medical appointments or from getting medications?: No (per wife) In the past 12 months, has lack of transportation kept you from meetings, work, or from getting things needed for daily living?: No (per wife)  Journalist, newspaper / Equipment Home Assistive  Devices/Equipment: None Home Equipment: None  Prior Device Use: Indicate devices/aids used by the patient prior to current illness, exacerbation or injury? None of the above  Current Functional Level Cognition  Overall Cognitive Status: Impaired/Different from baseline Current Attention Level: Sustained, Selective Orientation Level: Oriented X4 Following Commands: Follows one step commands with increased time, Follows one step commands consistently, Follows multi-step commands inconsistently Safety/Judgement: Decreased awareness of safety, Decreased  awareness of deficits General Comments: alert but believed he could transfer to recliner without assistance    Extremity Assessment (includes Sensation/Coordination)  Upper Extremity Assessment: Difficult to assess due to impaired cognition LUE Deficits / Details: Pt able to squeeze therapist's hands to command with WNL grip bilaterally.  He was also able to bring his LUE up to scratch behind his neck.  Functionally, he was able to use the LUE to assist the right with holding his orange juice but coordination overall looks to be slightly impaired.  Will look at more specifically in function. LUE Sensation:  (unable to determine) LUE Coordination: decreased gross motor, decreased fine motor  Lower Extremity Assessment: Defer to PT evaluation LLE Deficits / Details: able to move LLE against gravity but weaker than R, unable to be formally tested due to cognitive deficits    ADLs  Overall ADL's : Needs assistance/impaired Eating/Feeding: Moderate assistance, Sitting Eating/Feeding Details (indicate cue type and reason): drink from cup Grooming: Wash/dry hands, Wash/dry face, Oral care, Brushing hair, Supervision/safety, Sitting, Cueing for sequencing Upper Body Bathing: Maximal assistance, Sitting Upper Body Bathing Details (indicate cue type and reason): simulated Lower Body Bathing: Maximal assistance, +2 for physical assistance, Sit to/from  stand Lower Body Bathing Details (indicate cue type and reason): simulated Upper Body Dressing : Minimal assistance, Sitting Lower Body Dressing: Total assistance Lower Body Dressing Details (indicate cue type and reason): gripper socks only Toilet Transfer: Maximal assistance Toilet Transfer Details (indicate cue type and reason): simulated to chair Toileting- Clothing Manipulation and Hygiene: Total assistance, Sit to/from stand, Sitting/lateral lean Toileting - Clothing Manipulation Details (indicate cue type and reason): patient unable to complete peri care when standing in stedy Functional mobility during ADLs: Maximal assistance, +2 for safety/equipment (standing EOB) General ADL Comments: Patient session focus on pre-gait training in hallway with PT (refer to PT note) and functional mobility to work on ADL tolerance. Patient remains limited with regard to lower body ADLs and toileting, but improved command following and static balance in stedy and when using rail in hallway to address functional deficits. Patient remains at a mod-max A level for ADL management, and mod of 2 for transfers. OT continues to highly recommend intensive therapy services. OT will continue to follow.    Mobility  Overal bed mobility: Needs Assistance Bed Mobility: Supine to Sit Rolling: Mod assist Sidelying to sit: Mod assist Supine to sit: Mod assist, HOB elevated, +2 for physical assistance Sit to supine: Mod assist General bed mobility comments: instructions on rail use and assistance with trunk and scooting to EOB    Transfers  Overall transfer level: Needs assistance Equipment used: None Transfers: Sit to/from Stand, Bed to chair/wheelchair/BSC Sit to Stand: Max assist Bed to/from chair/wheelchair/BSC transfer type:: Stand pivot Stand pivot transfers: Max assist Transfer via Lift Equipment: Stedy General transfer comment: face to face technique with left knee blocked    Ambulation / Gait / Stairs /  Wheelchair Mobility  Ambulation/Gait General Gait Details: to fatigued and lethargic to progress ambulation today Gait velocity: slow    Posture / Balance Dynamic Sitting Balance Sitting balance - Comments: min assist initially and progressed to supervision Balance Overall balance assessment: Needs assistance Sitting-balance support: Single extremity supported, Feet supported Sitting balance-Leahy Scale: Poor Sitting balance - Comments: min assist initially and progressed to supervision Postural control: Left lateral lean Standing balance support: During functional activity, Bilateral upper extremity supported, Reliant on assistive device for balance Standing balance-Leahy Scale: Poor Standing balance comment: stood for  transfer    Special needs/care consideration Delirium Precautions Biliary tube Cook slip-coat 10/2 Fr RUQ placed 8/20 DNR on acute Palliative consulted but wife no longer wishes for their involvement.Feels she has ample supports to make decisions without them   Previous Home Environment  Living Arrangements: Spouse/significant other  Lives With: Spouse Available Help at Discharge: Family, Available 24 hours/day Type of Home: House Home Layout: One level Home Access: Stairs to enter Entrance Stairs-Rails: Left Entrance Stairs-Number of Steps: 3 Bathroom Shower/Tub: Engineer, manufacturing systems: Handicapped height Bathroom Accessibility: Yes How Accessible: Accessible via walker Home Care Services: No  Discharge Living Setting Plans for Discharge Living Setting: Patient's home, Lives with (comment) (wife) Type of Home at Discharge: House Discharge Home Layout: One level Discharge Home Access: Stairs to enter Entrance Stairs-Rails: None Entrance Stairs-Number of Steps: 3 Discharge Bathroom Shower/Tub: Tub/shower unit Discharge Bathroom Toilet: Handicapped height Discharge Bathroom Accessibility: Yes How Accessible: Accessible via walker Does the patient  have any problems obtaining your medications?: No  Social/Family/Support Systems Patient Roles: Spouse, Parent Contact Information: wife, [name] Anticipated Caregiver: wife Anticipated Caregiver's Contact Information: [phone] Ability/Limitations of Caregiver: wife can provide supervision level and light min asisst as needed Caregiver Availability: 24/7 Discharge Plan Discussed with Primary Caregiver: Yes Is Caregiver In Agreement with Plan?: Yes Does Caregiver/Family have Issues with Lodging/Transportation while Pt is in Rehab?: No  Goals Patient/Family Goal for Rehab: supervision PT, supervsion to light min OT, supervision SLP Expected length of stay: ELOS 2 to 3 weeks Additional Information: Patient is very HOH Pt/Family Agrees to Admission and willing to participate: Yes Program Orientation Provided & Reviewed with Pt/Caregiver Including Roles  & Responsibilities: Yes  Decrease burden of Care through IP rehab admission: n/a  Possible need for SNF placement upon discharge:not anticipated  Patient Condition: This patient's medical and functional status has changed since the consult dated: 07/06/23 in which the Rehabilitation Physician determined and documented that the patient's condition is appropriate for intensive rehabilitative care in an inpatient rehabilitation facility. See "History of Present Illness" (above) for medical update. Functional changes are: overall max assist. Patient's medical and functional status update has been discussed with the Rehabilitation physician and patient remains appropriate for inpatient rehabilitation. Will admit to inpatient rehab 07/14/23 when bed is available.  Preadmission Screen Completed By:  Clois Dupes, RN MSN 07/13/2023 11:54 AM ______________________________________________________________________   Discussed status with Dr. Shearon Stalls on 07/13/23 at 0930 and received approval for admission 07/14/23 when bed is available.  Admission  Coordinator:  Clois Dupes, RN MSN time [id number] Date 07/13/23

## 2023-07-06 NOTE — Progress Notes (Addendum)
Physical Therapy Treatment Patient Details Name: Hunter Sparks MRN: [id number] DOB: Dec 24, 1945 Today's Date: 07/06/2023   History of Present Illness Pt is 77 yo male who presents on 07/02/23 from CIR with acute abdominal pain, underwent drain placement on 07/03/23, to have cholecystectomy soon. PMH: acute infarct in the right cerebral hemisphere involving the basal ganglia, frontal, parietal and occipital cortex in the MCA and the PCA watershed distributions 06/21/23 that led to CIR stay, LBP, DDD, HLD, HTN, hypothyroidism, DM2    PT Comments  Pt received in bed, confused. He required mod assist supine to sit, +2 max assist sit to stand in stedy, and dependent stedy transfer bed to recliner. He demo poor sitting balance and zero standing balance. Heavy L lateral lean in stedy. Pt in recliner with feet elevated at end of session. Wife present in room. Current POC remains appropriate.     If plan is discharge home, recommend the following: Help with stairs or ramp for entrance;Assistance with cooking/housework;Two people to help with walking and/or transfers;Supervision due to cognitive status;Assist for transportation;Direct supervision/assist for financial management;Assistance with feeding;Direct supervision/assist for medications management;Two people to help with bathing/dressing/bathroom   Can travel by private vehicle        Equipment Recommendations  Other (comment) (TBD)    Recommendations for Other Services       Precautions / Restrictions Precautions Precautions: Fall;Other (comment) Precaution Comments: left mild hemiparesis, confusion Restrictions Weight Bearing Restrictions: No Other Position/Activity Restrictions: R sided percutaneous drain     Mobility  Bed Mobility Overal bed mobility: Needs Assistance Bed Mobility: Supine to Sit     Supine to sit: Mod assist, HOB elevated, Used rails     General bed mobility comments: increased time, cues for sequencing, assist  with LLE and trunk    Transfers Overall transfer level: Needs assistance Equipment used: Ambulation equipment used Transfers: Sit to/from Stand, Bed to chair/wheelchair/BSC Sit to Stand: Max assist, +2 physical assistance           General transfer comment: STS x 2 trials in stedy. Pt demo flexed posture and L lateral lean. Dependent stedy transfer bed to recliner. Maximove sling in place in recliner for return to bed, if needed. Transfer via Lift Equipment: Stedy  Ambulation/Gait                   Stairs             Wheelchair Mobility     Tilt Bed    Modified Rankin (Stroke Patients Only) Modified Rankin (Stroke Patients Only) Pre-Morbid Rankin Score: No symptoms Modified Rankin: Severe disability     Balance Overall balance assessment: Needs assistance Sitting-balance support: Feet supported, No upper extremity supported Sitting balance-Leahy Scale: Poor   Postural control: Posterior lean, Left lateral lean Standing balance support: During functional activity, Bilateral upper extremity supported, Reliant on assistive device for balance Standing balance-Leahy Scale: Zero                              Cognition Arousal: Alert Behavior During Therapy: Restless, WFL for tasks assessed/performed Overall Cognitive Status: Impaired/Different from baseline Area of Impairment: Orientation, Attention, Memory, Following commands, Safety/judgement, Awareness, Problem solving                 Orientation Level: Disoriented to, Time, Situation Current Attention Level: Sustained Memory: Decreased short-term memory Following Commands: Follows one step commands with increased time, Follows one step commands  inconsistently Safety/Judgement: Decreased awareness of safety Awareness: Intellectual Problem Solving: Slow processing, Decreased initiation, Difficulty sequencing, Requires verbal cues, Requires tactile cues General Comments: remains  confused but appears to be clearing        Exercises      General Comments General comments (skin integrity, edema, etc.): SpO2 92% on RA      Pertinent Vitals/Pain Pain Assessment Pain Assessment: Faces Faces Pain Scale: Hurts little more Pain Location: back Pain Descriptors / Indicators: Sore, Discomfort, Grimacing Pain Intervention(s): Monitored during session, Repositioned    Home Living                          Prior Function            PT Goals (current goals can now be found in the care plan section) Acute Rehab PT Goals Patient Stated Goal: go back to rehab Progress towards PT goals: Progressing toward goals    Frequency    Min 1X/week      PT Plan      Co-evaluation              AM-PAC PT "6 Clicks" Mobility   Outcome Measure  Help needed turning from your back to your side while in a flat bed without using bedrails?: A Lot Help needed moving from lying on your back to sitting on the side of a flat bed without using bedrails?: A Lot Help needed moving to and from a bed to a chair (including a wheelchair)?: Total Help needed standing up from a chair using your arms (e.g., wheelchair or bedside chair)?: Total Help needed to walk in hospital room?: Total Help needed climbing 3-5 steps with a railing? : Total 6 Click Score: 8    End of Session Equipment Utilized During Treatment: Gait belt Activity Tolerance: Patient tolerated treatment well Patient left: in chair;with call bell/phone within reach;with chair alarm set;with family/visitor present Nurse Communication: Mobility status;Need for lift equipment (stedy vs maximove) PT Visit Diagnosis: Unsteadiness on feet (R26.81);Other abnormalities of gait and mobility (R26.89);Muscle weakness (generalized) (M62.81);Hemiplegia and hemiparesis Hemiplegia - Right/Left: Left Hemiplegia - dominant/non-dominant: Non-dominant Hemiplegia - caused by: Cerebral infarction     Time:  1022-1051 PT Time Calculation (min) (ACUTE ONLY): 29 min  Charges:    $Therapeutic Activity: 23-37 mins PT General Charges $$ ACUTE PT VISIT: 1 Visit                     Ferd Glassing., PT  Office # [phone]    Ilda Foil 07/06/2023, 12:42 PM

## 2023-07-07 DIAGNOSIS — E43 Unspecified severe protein-calorie malnutrition: Secondary | ICD-10-CM | POA: Insufficient documentation

## 2023-07-07 LAB — CBC
HCT: 38.3 % — ABNORMAL LOW (ref 39.0–52.0)
Hemoglobin: 12.9 g/dL — ABNORMAL LOW (ref 13.0–17.0)
MCH: 30.6 pg (ref 26.0–34.0)
MCHC: 33.7 g/dL (ref 30.0–36.0)
MCV: 91 fL (ref 80.0–100.0)
Platelets: 199 10*3/uL (ref 150–400)
RBC: 4.21 MIL/uL — ABNORMAL LOW (ref 4.22–5.81)
RDW: 12.7 % (ref 11.5–15.5)
WBC: 15.9 10*3/uL — ABNORMAL HIGH (ref 4.0–10.5)
nRBC: 0.1 % (ref 0.0–0.2)

## 2023-07-07 LAB — CULTURE, BLOOD (ROUTINE X 2)
Culture: NO GROWTH
Culture: NO GROWTH
Special Requests: ADEQUATE

## 2023-07-07 LAB — COMPREHENSIVE METABOLIC PANEL
ALT: 38 U/L (ref 0–44)
AST: 59 U/L — ABNORMAL HIGH (ref 15–41)
Albumin: 2.1 g/dL — ABNORMAL LOW (ref 3.5–5.0)
Alkaline Phosphatase: 100 U/L (ref 38–126)
Anion gap: 12 (ref 5–15)
BUN: 32 mg/dL — ABNORMAL HIGH (ref 8–23)
CO2: 27 mmol/L (ref 22–32)
Calcium: 8.3 mg/dL — ABNORMAL LOW (ref 8.9–10.3)
Chloride: 97 mmol/L — ABNORMAL LOW (ref 98–111)
Creatinine, Ser: 1.53 mg/dL — ABNORMAL HIGH (ref 0.61–1.24)
GFR, Estimated: 46 mL/min — ABNORMAL LOW (ref >60)
Glucose, Bld: 141 mg/dL — ABNORMAL HIGH (ref 70–99)
Potassium: 4.1 mmol/L (ref 3.5–5.1)
Sodium: 136 mmol/L (ref 135–145)
Total Bilirubin: 0.6 mg/dL (ref 0.3–1.2)
Total Protein: 5.5 g/dL — ABNORMAL LOW (ref 6.5–8.1)

## 2023-07-07 LAB — GLUCOSE, CAPILLARY
Glucose-Capillary: 110 mg/dL — ABNORMAL HIGH (ref 70–99)
Glucose-Capillary: 117 mg/dL — ABNORMAL HIGH (ref 70–99)
Glucose-Capillary: 120 mg/dL — ABNORMAL HIGH (ref 70–99)
Glucose-Capillary: 126 mg/dL — ABNORMAL HIGH (ref 70–99)
Glucose-Capillary: 126 mg/dL — ABNORMAL HIGH (ref 70–99)
Glucose-Capillary: 143 mg/dL — ABNORMAL HIGH (ref 70–99)

## 2023-07-07 NOTE — Progress Notes (Signed)
[facility]  PROGRESS NOTE  Hunter Sparks ZOX:[id number] DOB: 05-03-1946 DOA: 07/02/2023 PCP: Benita Stabile, MD   Brief HPI:   Recently discharged from the hospital will on June 23, 2023 after spending 2 days in the hospital for an acute stroke. 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 He has had left hemiparesis since then. D/c'd to CIR Patient was at rehab where he complained of severe abdominal pain for 2 days.  WBC was elevated 22.5.  He was sent to the hospital for further workup.  Right upper quadrant ultrasound showed acute cholecystitis.  General surgery was consulted and recommended IR place drain.  Drain placed on 8/20.    Assessment/Plan:   Acute cholecystitis - cholecystostomy drain per IR:  Percutaneous cholecystostomy drain to remain in place at least 6 weeks. Recommend fluoroscopy with injection of the drain in IR to evaluate for patency of the cystic duct. IR will follow up with the patient in approximately 6 weeks. A scheduler from our office will call the patient with a date/time of his appointment.  If the duct is patent and general surgery feels patient is stable for cholecystectomy, the drain would be removed at time of surgery. If the duct is patent and general surgery feels patient is NEVER a candidate for cholecystectomy, drain can be capped for a trial. If symptoms recur, then place to gravity bag again. If trial is successful, discuss possible removal of the drain -High risk for surgery given being on Plavix and recent stroke -Antibiotics changed based on culture: Klebsiella -Will need outpatient follow-up with general surgery to discuss timing of cholecystectomy -diet started  Sepsis due to acute cholecystitis -Presented with sepsis physiology due to acute cholecystitis, -Initial lactic acid 2.9;  improved to 1.2 -BP low after IV dilaudid- avoid  Hemiparesis affecting left side as effect of CVA -Recent CVA causing left hemiparesis -? back to the inpatient rehab once medically stable -Continue Plavix, aspirin  Diabetes mellitus type 2 -SSI --hold insulin due to low BS from poor PO intake  CAD -Continue aspirin, Lipitor, Lopressor  Hypothyroidism -Continue Synthroid  AKI -appears pre-renal -improving  Delirium -precautions -remove mittens  If continues to not improve, may benefit from GOC converation-- wife hoping for recovery   Subjective   Still appears confused at times    Data Reviewed:   CBG:  Recent Labs  Lab 07/06/23 2015 07/06/23 2330 07/07/23 0319 07/07/23 0750 07/07/23 1151  GLUCAP 166* 128* 126* 126* 120*    SpO2: 96 % O2 Flow Rate (L/min): 2 L/min    Vitals:   07/07/23 0400 07/07/23 0611 07/07/23 0748 07/07/23 1147  BP: (!) 163/75  (!) 150/83 (!) 114/51  Pulse: 90  86 75  Resp: 13  17 20   Temp: 98.3 F (36.8 C)  98.3 F (36.8 C) 98.6 F (37 C)  TempSrc: Axillary  Axillary Axillary  SpO2: 92% (!) 89% 95% 96%  Weight: 93.9 kg         General: Appearance:     Overweight male in no acute distress     Lungs:     respirations unlabored  Heart:    Normal heart rate.  MS:   All extremities are intact.   Neurologic:   Awake, alert- confused       Data Reviewed:  Basic Metabolic Panel: Recent Labs  Lab 07/03/23  1191 07/04/23 0425 07/05/23 0437 07/06/23 0746 07/07/23 0410  NA 133* 134* 135 135 136  K 4.7 4.2 3.9 3.9 4.1  CL 100 98 99 101 97*  CO2 26 25 22 27 27   GLUCOSE 180* 144* 112* 155* 141*  BUN 22 37* 50* 44* 32*  CREATININE 1.30* 2.54* 2.34* 1.74* 1.53*  CALCIUM 8.3* 7.9* 8.0* 8.2* 8.3*  MG 1.9  --   --   --   --     CBC: Recent Labs  Lab 07/02/23 0520 07/02/23 1619 07/03/23 0249 07/04/23 0425 07/05/23 0710 07/06/23 0746 07/07/23 0410  WBC 16.1*   < > 26.3* 24.2* 19.3* 16.5* 15.9*  NEUTROABS  12.7*  --  21.8*  --   --   --   --   HGB 16.4   < > 14.4 12.8* 13.6 12.5* 12.9*  HCT 46.2   < > 41.6 36.9* 39.9 37.6* 38.3*  MCV 90.6   < > 89.3 92.9 94.1 91.5 91.0  PLT 231   < > 227 175 189 198 199   < > = values in this interval not displayed.    LFT Recent Labs  Lab 07/02/23 0520 07/03/23 0249 07/04/23 0425 07/05/23 0437 07/07/23 0410  AST 20 20 99* 114* 59*  ALT 22 16 54* 51* 38  ALKPHOS 87 65 138* 106 100  BILITOT 0.9 1.4* 0.8 0.8 0.6  PROT 7.6 6.2* 5.2* 5.8* 5.5*  ALBUMIN 3.7 2.8* 2.1* 2.4* 2.1*         DVT prophylaxis: Heparin  Code Status: Full code  Family Communication: called wife   CONSULTS  General Surgery IR         Joseph Art   [facility] If 7PM-7AM, please contact night-coverage at [email or web address],   07/07/2023, 11:57 AM  LOS: 5 days

## 2023-07-07 NOTE — Progress Notes (Signed)
Automatic food tray delivered to pts room - RN and NT both attempted to feed patient at different times.  Pt refused by repeating no and shaking his head.  Attempted to give pt liquid - pt will not allow any oral intake.

## 2023-07-07 NOTE — Evaluation (Signed)
Clinical/Bedside Swallow Evaluation Patient Details  Name: Hunter Sparks MRN: [id number] Date of Birth: [birth year]  Today's Date: 07/07/2023 Time: SLP Start Time (ACUTE ONLY): 1210 SLP Stop Time (ACUTE ONLY): 1224 SLP Time Calculation (min) (ACUTE ONLY): 14 min  Past Medical History:  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HCC)    Arthritis    "back, hips, legs" (06/10/2015)   Chest pain    Chronic lower back pain    Depression    DJD (degenerative joint disease)    Hyperlipidemia 06/11/2015   Hypertension    Hypothyroidism    Kidney stone    Kidney stone    Type II diabetes mellitus (HCC)    Past Surgical History:  Past Surgical History:  Procedure Laterality Date   CARDIAC CATHETERIZATION N/A 06/11/2015   Procedure: Left Heart Cath and Coronary Angiography;  Surgeon: Lyn Records, MD;  Location: [facility] INVASIVE CV LAB;  Service: Cardiovascular;  Laterality: N/A;   COLONOSCOPY  06/21/2011   Procedure: COLONOSCOPY;  Surgeon: Malissa Hippo, MD;  Location: [facility] ENDO SUITE;  Service: Endoscopy;  Laterality: N/A;   COLONOSCOPY     COLONOSCOPY N/A 09/20/2017   Procedure: COLONOSCOPY;  Surgeon: Malissa Hippo, MD;  Location: [facility] SUITE;  Service: Endoscopy;  Laterality: N/A;  1200   CORONARY ARTERY BYPASS GRAFT N/A 06/14/2015   Procedure: CORONARY ARTERY BYPASS GRAFTING times four using Left Internal mammary artery and right leg Saphenous vein graft;  Surgeon: Kerin Perna, MD;  Location: [facility] OR;  Service: Open Heart Surgery;  Laterality: N/A;   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 PMH: acute infarct in the right cerebral hemisphere involving the basal ganglia, frontal, parietal and occipital cortex in the MCA and the PCA watershed distributions 06/21/23 that led to CIR stay, LBP, DDD, HLD, HTN, hypothyroidism, DM2.    Assessment / Plan / Recommendation  Clinical Impression  Pt was seen for a clinical swallow evaluation and presents with mild oral dysphagia.  Pt was seen with trials of thin liquid, puree, and regular solids.  He exhibited mildly prolonged mastication of regular solids with mild diffuse oral residue.  No overt s/sx of aspiration were observed with PO trials.  Recommend Dysphagia 3 (mech soft) and thin liquid with medication administered whole in puree.  SLP will f/u to monitor diet tolerance.  SLP Visit Diagnosis: Dysphagia, oral phase (R13.11)    Aspiration Risk  Mild aspiration risk    Diet Recommendation Dysphagia 3 (Mech soft);Thin liquid    Liquid Administration via: Cup;Straw Medication Administration: Whole meds with puree Supervision: Patient able to self feed Compensations: Minimize environmental distractions;Slow rate;Small sips/bites    Other  Recommendations Oral Care Recommendations: Oral care BID    Recommendations for follow up therapy are one component of a multi-disciplinary discharge planning process, led by the attending physician.  Recommendations may be updated based on patient status, additional functional criteria and insurance authorization.  Follow up Recommendations No SLP follow up  Assistance Recommended at Discharge    Functional Status Assessment Patient has had a recent decline in their functional status and demonstrates the ability to make significant improvements in function in a reasonable and predictable amount of time.  Frequency and Duration min 2x/week  2 weeks       Prognosis Prognosis for improved oropharyngeal function: Good       Swallow Study   General HPI: Pt is 77 yo male who presents on 07/02/23 from CIR with acute abdominal pain, underwent drain placement on 07/03/23, to have cholecystectomy soon. PMH: acute infarct in the right cerebral hemisphere involving the basal ganglia, frontal, parietal and occipital cortex in the MCA and the PCA watershed distributions 06/21/23 that led to CIR stay, LBP, DDD, HLD, HTN, hypothyroidism, DM2. Type of Study: Bedside Swallow Evaluation Previous Swallow Assessment: N/A Diet Prior to this Study: Regular;Thin liquids (Level 0) Temperature Spikes Noted: No Respiratory Status: Nasal cannula History of Recent Intubation: No Behavior/Cognition: Alert;Cooperative Oral Cavity Assessment: Dry Oral Care Completed by SLP: Yes Oral Cavity - Dentition: Dentures, top;Dentures, bottom Vision: Functional for self-feeding Self-Feeding Abilities: Needs set up;Able to feed self Patient Positioning: Upright in bed Baseline Vocal Quality: Normal Volitional Cough: Strong Volitional Swallow: Able to elicit    Oral/Motor/Sensory Function Overall Oral Motor/Sensory Function: Within functional limits   Ice Chips Ice chips: Not tested   Thin Liquid Thin Liquid: Within functional limits    Nectar Thick Nectar Thick Liquid: Not tested   Honey Thick Honey Thick Liquid: Not tested   Puree Puree: Within functional limits Presentation: Spoon   Solid     Solid: Impaired Presentation: Self Fed Oral Phase Impairments: Impaired mastication Oral Phase Functional Implications: Prolonged oral transit;Impaired mastication     Eino Farber, M.S., CCC-SLP Acute Rehabilitation Services Office: [phone]  Shanon Rosser Treylin Burtch 07/07/2023,12:47 PM

## 2023-07-07 NOTE — Plan of Care (Signed)
  Problem: Education: Goal: Knowledge of disease or condition will improve Outcome: Progressing Goal: Knowledge of secondary prevention will improve (MUST DOCUMENT ALL) Outcome: Progressing Goal: Knowledge of patient specific risk factors will improve (Mark N/A or DELETE if not current risk factor) Outcome: Progressing   Problem: Ischemic Stroke/TIA Tissue Perfusion: Goal: Complications of ischemic stroke/TIA will be minimized Outcome: Progressing   Problem: Coping: Goal: Will verbalize positive feelings about self Outcome: Progressing Goal: Will identify appropriate support needs Outcome: Progressing   Problem: Health Behavior/Discharge Planning: Goal: Ability to manage health-related needs will improve Outcome: Progressing Goal: Goals will be collaboratively established with patient/family Outcome: Progressing   Problem: Self-Care: Goal: Ability to participate in self-care as condition permits will improve Outcome: Progressing Goal: Verbalization of feelings and concerns over difficulty with self-care will improve Outcome: Progressing Goal: Ability to communicate needs accurately will improve Outcome: Progressing   Problem: Nutrition: Goal: Risk of aspiration will decrease Outcome: Progressing Goal: Dietary intake will improve Outcome: Progressing   Problem: Education: Goal: Knowledge of General Education information will improve Description: Including pain rating scale, medication(s)/side effects and non-pharmacologic comfort measures Outcome: Progressing   Problem: Health Behavior/Discharge Planning: Goal: Ability to manage health-related needs will improve Outcome: Progressing   Problem: Clinical Measurements: Goal: Ability to maintain clinical measurements within normal limits will improve Outcome: Progressing Goal: Will remain free from infection Outcome: Progressing Goal: Diagnostic test results will improve Outcome: Progressing Goal: Respiratory  complications will improve Outcome: Progressing Goal: Cardiovascular complication will be avoided Outcome: Progressing   Problem: Activity: Goal: Risk for activity intolerance will decrease Outcome: Progressing   Problem: Nutrition: Goal: Adequate nutrition will be maintained Outcome: Progressing   Problem: Coping: Goal: Level of anxiety will decrease Outcome: Progressing   Problem: Elimination: Goal: Will not experience complications related to bowel motility Outcome: Progressing Goal: Will not experience complications related to urinary retention Outcome: Progressing   Problem: Pain Managment: Goal: General experience of comfort will improve Outcome: Progressing   Problem: Safety: Goal: Ability to remain free from injury will improve Outcome: Progressing   Problem: Skin Integrity: Goal: Risk for impaired skin integrity will decrease Outcome: Progressing   Problem: Education: Goal: Ability to describe self-care measures that may prevent or decrease complications (Diabetes Survival Skills Education) will improve Outcome: Progressing Goal: Individualized Educational Video(s) Outcome: Progressing   Problem: Coping: Goal: Ability to adjust to condition or change in health will improve Outcome: Progressing   Problem: Fluid Volume: Goal: Ability to maintain a balanced intake and output will improve Outcome: Progressing   Problem: Health Behavior/Discharge Planning: Goal: Ability to identify and utilize available resources and services will improve Outcome: Progressing Goal: Ability to manage health-related needs will improve Outcome: Progressing   Problem: Metabolic: Goal: Ability to maintain appropriate glucose levels will improve Outcome: Progressing   Problem: Nutritional: Goal: Maintenance of adequate nutrition will improve Outcome: Progressing Goal: Progress toward achieving an optimal weight will improve Outcome: Progressing   Problem: Skin  Integrity: Goal: Risk for impaired skin integrity will decrease Outcome: Progressing   Problem: Tissue Perfusion: Goal: Adequacy of tissue perfusion will improve Outcome: Progressing   

## 2023-07-07 NOTE — Progress Notes (Addendum)
Pt lethargic throughout the day.  Pt with intermittent agitation and pulling at equipment.  When pt is awake, he is yelling out/moaning.  Blinds open and lights on to encourage alertness throughout the day.  Pt transferred to chair via Maximove - 2+ assist.  Pt continued to sleep in chair.    PO intake encouraged throughout the day.  Pt declined food/ensure and verbally requested water with minimal intake.  Will continue to monitor.

## 2023-07-08 DIAGNOSIS — Z515 Encounter for palliative care: Secondary | ICD-10-CM

## 2023-07-08 DIAGNOSIS — Z7189 Other specified counseling: Secondary | ICD-10-CM

## 2023-07-08 LAB — CBC
HCT: 42 % (ref 39.0–52.0)
Hemoglobin: 13.9 g/dL (ref 13.0–17.0)
MCH: 30.5 pg (ref 26.0–34.0)
MCHC: 33.1 g/dL (ref 30.0–36.0)
MCV: 92.3 fL (ref 80.0–100.0)
Platelets: 195 10*3/uL (ref 150–400)
RBC: 4.55 MIL/uL (ref 4.22–5.81)
RDW: 13.1 % (ref 11.5–15.5)
WBC: 15.8 10*3/uL — ABNORMAL HIGH (ref 4.0–10.5)
nRBC: 0 % (ref 0.0–0.2)

## 2023-07-08 LAB — GLUCOSE, CAPILLARY
Glucose-Capillary: 105 mg/dL — ABNORMAL HIGH (ref 70–99)
Glucose-Capillary: 110 mg/dL — ABNORMAL HIGH (ref 70–99)
Glucose-Capillary: 149 mg/dL — ABNORMAL HIGH (ref 70–99)
Glucose-Capillary: 181 mg/dL — ABNORMAL HIGH (ref 70–99)
Glucose-Capillary: 227 mg/dL — ABNORMAL HIGH (ref 70–99)
Glucose-Capillary: 91 mg/dL (ref 70–99)

## 2023-07-08 LAB — COMPREHENSIVE METABOLIC PANEL
ALT: 44 U/L (ref 0–44)
AST: 72 U/L — ABNORMAL HIGH (ref 15–41)
Albumin: 2 g/dL — ABNORMAL LOW (ref 3.5–5.0)
Alkaline Phosphatase: 101 U/L (ref 38–126)
Anion gap: 9 (ref 5–15)
BUN: 29 mg/dL — ABNORMAL HIGH (ref 8–23)
CO2: 28 mmol/L (ref 22–32)
Calcium: 8.3 mg/dL — ABNORMAL LOW (ref 8.9–10.3)
Chloride: 101 mmol/L (ref 98–111)
Creatinine, Ser: 1.46 mg/dL — ABNORMAL HIGH (ref 0.61–1.24)
GFR, Estimated: 49 mL/min — ABNORMAL LOW (ref >60)
Glucose, Bld: 116 mg/dL — ABNORMAL HIGH (ref 70–99)
Potassium: 4 mmol/L (ref 3.5–5.1)
Sodium: 138 mmol/L (ref 135–145)
Total Bilirubin: 0.8 mg/dL (ref 0.3–1.2)
Total Protein: 5.4 g/dL — ABNORMAL LOW (ref 6.5–8.1)

## 2023-07-08 MED ORDER — TRAMADOL HCL 50 MG PO TABS
50.0000 mg | ORAL_TABLET | Freq: Three times a day (TID) | ORAL | Status: DC
  Administered 2023-07-08 – 2023-07-14 (×18): 50 mg via ORAL
  Filled 2023-07-08 (×19): qty 1

## 2023-07-08 MED ORDER — OLANZAPINE 5 MG PO TABS
5.0000 mg | ORAL_TABLET | Freq: Every day | ORAL | Status: DC
  Administered 2023-07-08 – 2023-07-14 (×7): 5 mg via ORAL
  Filled 2023-07-08 (×8): qty 1

## 2023-07-08 NOTE — Consult Note (Signed)
Palliative Medicine Inpatient Consult Note  Consulting Provider: Dr. Benjamine Mola  Reason for consult:   Palliative Care Consult Services Palliative Medicine Consult  Reason for Consult? goc- patient says he has lived long enough and wants to die   07/08/2023  HPI:  Per intake H&P -->  Recently discharged from the hospital will on June 23, 2023 after spending 2 days in the hospital for an acute stroke. 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 He has had left hemiparesis since then. D/c'd to CIR  Patient was at rehab where he complained of severe abdominal pain for 2 days.  WBC was elevated 22.5.  He was sent to the hospital for further workup.  Right upper quadrant ultrasound showed acute cholecystitis.  General surgery was consulted and recommended IR place drain.  Drain placed on 8/20.  Palliative care has been asked to get involved to further support goals of care conversations as patient has told nursing staff as well as medical staff that he no longer wants to live.  Clinical Assessment/Goals of Care:  *Please note that this is a verbal dictation therefore any spelling or grammatical errors are due to the "Dragon Medical One" system interpretation.  I have reviewed medical records including EPIC notes, labs and imaging, received report from bedside RN, assessed the patient who is awake and alert complaining of feeling parched and requests ice water.   I met with Minard Borjas initially and called his wife [name] to further discuss diagnosis prognosis, GOC, EOL wishes, disposition and options.   I introduced Palliative Medicine as specialized medical care for people living with serious illness. It focuses on providing relief from the symptoms and stress of a serious illness. The goal is to improve quality of life for both the  patient and the family.  Medical History Review and Understanding:  I reviewed with Allon his past medical history significant for quadruple bypass surgery, chronic lower back pain in the setting of degenerative joint joint disease and arthritis, hypertension, hypothyroidism, type 2 diabetes, and recent stroke.  Social History:  [place] and I reviewed that he is from [facility]  He has been married to his wife, [name] for the past 56 years.  He has 2 sons.  He used to work for the [place] [employer].  He shares that he is a man who enjoys fishing and hunting.  He has a strong faith and is of [place] origins.    Functional and Nutritional State:  Preceding hospitalization Stanley would often enjoy the outdoors, "bush hogging" the fields.  He was able to perform B ADLs though per his wife he was "scooting and leaning more".  Trayson did eat and drink well preceding his stroke.  His wife shares he is lost roughly 8 to 10 pounds since hospitalization.  Advance Directives:  A detailed discussion was had today regarding advanced directives.  Patient's wife [name] shares she believes that there are advanced directives though they are with her lawyer.  Code Status:  Concepts specific to code status, artifical feeding and hydration, continued IV antibiotics and rehospitalization was had.  The difference between a aggressive medical intervention path  and a palliative comfort care path for this patient at this time was had.   Encouraged patient/family to consider DNR/DNI status understanding evidenced based poor outcomes in similar hospitalized patient, as the cause of arrest  is likely associated with advanced chronic/terminal illness rather than an easily reversible acute cardio-pulmonary event. I explained that DNR/DNI does not change the medical plan and it only comes into effect after a person has arrested (died).  It is a protective measure to keep [place] from harming the patient in their last  moments of life.   [name] shares that she will speak to her son more about this as he is a retired Museum/gallery exhibitions officer.  Discussion:  We reviewed the complicated hospital course Daylin has held in the setting of his improvements during his time in CIR and then complications associated with sepsis due to acute cholecystitis.  [name] shares with me that she knows her husband and he is exceptionally frustrated with his prolonged hospitalization and is feeling a great deal of pain from lying in the same position.  She believes that he does not want to die rather wants to live just does not want to be confined to his bed.  We discussed the option of treating Deavion's underlying pain and trying to reset his circadian rhythm in an effort to treat his underlying delirium.   Reviewed the importance of ongoing discussions as related to artificial nutrition.  I encouraged [name] to advise her sons and for herself to bring him food that he may like.  We discussed the differences between short-term artificial nutrition via core track as opposed to long-term artificial nutrition via PEG tube.  [name] is at this time is of the mindset that short-term as a trial to see if improvements could be made would be something she and her family are considerate of.  We reviewed per her discussion with Dr. Benjamine Mola that Randal's Lipitor will be discontinued.    We discussed further to allow time for outcomes.  [name] shares the goal is for Merdith to get back to CIR for rehabilitation.  Discussed the importance of continued conversation with family and their  medical providers regarding overall plan of care and treatment options, ensuring decisions are within the context of the patients values and GOCs.  Decision Maker: Fransico Michael (Spouse): [phone] (Mobile)   SUMMARY OF RECOMMENDATIONS   Full code --> patient spouse [name] will discuss further with her children  Symptom management as below  Goals are for improvement to the point  whereby patient can go back to CIR for continued rehabilitation poststroke  Ongoing palliative care support  Code Status/Advance Care Planning: FULL CODE   Symptom Management:  Chronic lower back pain: -Acetaminophen 1 g 3 times daily -Tramadol 50 mg 3 times daily -Lidoderm 1 patch to lower back -Oxycodone 5 to 10 mg every 4 hours for breakthrough pain  Adult failure to thrive: -Dietitian actively involved -Have requested a calorie count -Encouraged family to bring food from home -Ongoing discussions related to short-term artificial nutrition  Delirium: -Reorientation measures/protocol -Olanzapine 5 mg at 5 PM nightly  Spiritual support: -Chaplain consult ordered  Palliative Prophylaxis:  Aspiration, Bowel Regimen, Delirium Protocol, Frequent Pain Assessment, Oral Care, Palliative Wound Care, and Turn Reposition  Additional Recommendations (Limitations, Scope, Preferences): Continue current care  Psycho-social/Spiritual:  Desire for further Chaplaincy support: Yes patient is a strong [place] Additional Recommendations: Education on chronic disease burden,  prolonged hospitalization, & acute sepsis   Prognosis: Will depend upon progression nutritionally and functionally.  Patient's underlying comorbidities place him at a higher 105-month mortality risk.  Discharge Planning: Ideally discharge to CIR once medically optimized.  Vitals:   07/08/23 0352 07/08/23 0800  BP:  (!) 165/79  Pulse:  90  Resp:  (!) 21  Temp: 98.2 F (36.8 C) 98.6 F (37 C)  SpO2:  95%    Intake/Output Summary (Last 24 hours) at 07/08/2023 1117 Last data filed at 07/08/2023 1000 Gross per 24 hour  Intake 530 ml  Output 1395 ml  Net -865 ml   Last Weight  Most recent update: 07/08/2023  3:53 AM    Weight  91.2 kg (201 lb 1 oz)            Gen: Elderly Caucasian male chronically ill in appearance HEENT: Dry mucous membranes CV: Regular rate and rhythm  PULM: On room air breathing is  even and nonlabored ABD: Mild tenderness EXT: No edema Neuro: Alert and oriented x2  PPS: 40%   This conversation/these recommendations were discussed with patient primary care team, Dr. Benjamine Mola  Total Time: 58 Billing based on MDM: High  Problems Addressed: One acute or chronic illness or injury that poses a threat to life or bodily function  Amount and/or Complexity of Data: Category 3:Discussion of management or test interpretation with external physician/other qualified health care professional/appropriate source (not separately reported)  Risks: Decision not to resuscitate or to de-escalate care because of poor prognosis ______________________________________________________ Lamarr Lulas [place] Palliative Medicine Team Team Cell Phone: [phone] Please utilize secure chat with additional questions, if there is no response within 30 minutes please call the above phone number  Palliative Medicine Team providers are available by phone from 7am to 7pm daily and can be reached through the team cell phone.  Should this patient require assistance outside of these hours, please call the patient's attending physician.

## 2023-07-08 NOTE — Plan of Care (Signed)
  Problem: Education: Goal: Knowledge of disease or condition will improve Outcome: Progressing Goal: Knowledge of secondary prevention will improve (MUST DOCUMENT ALL) Outcome: Progressing Goal: Knowledge of patient specific risk factors will improve (Mark N/A or DELETE if not current risk factor) Outcome: Progressing   Problem: Ischemic Stroke/TIA Tissue Perfusion: Goal: Complications of ischemic stroke/TIA will be minimized Outcome: Progressing   Problem: Coping: Goal: Will verbalize positive feelings about self Outcome: Progressing Goal: Will identify appropriate support needs Outcome: Progressing   Problem: Health Behavior/Discharge Planning: Goal: Ability to manage health-related needs will improve Outcome: Progressing Goal: Goals will be collaboratively established with patient/family Outcome: Progressing   Problem: Self-Care: Goal: Ability to participate in self-care as condition permits will improve Outcome: Progressing Goal: Verbalization of feelings and concerns over difficulty with self-care will improve Outcome: Progressing Goal: Ability to communicate needs accurately will improve Outcome: Progressing   Problem: Nutrition: Goal: Risk of aspiration will decrease Outcome: Progressing Goal: Dietary intake will improve Outcome: Progressing   Problem: Education: Goal: Knowledge of General Education information will improve Description: Including pain rating scale, medication(s)/side effects and non-pharmacologic comfort measures Outcome: Progressing   Problem: Health Behavior/Discharge Planning: Goal: Ability to manage health-related needs will improve Outcome: Progressing   Problem: Clinical Measurements: Goal: Ability to maintain clinical measurements within normal limits will improve Outcome: Progressing Goal: Will remain free from infection Outcome: Progressing Goal: Diagnostic test results will improve Outcome: Progressing Goal: Respiratory  complications will improve Outcome: Progressing Goal: Cardiovascular complication will be avoided Outcome: Progressing   Problem: Activity: Goal: Risk for activity intolerance will decrease Outcome: Progressing   Problem: Nutrition: Goal: Adequate nutrition will be maintained Outcome: Progressing   Problem: Coping: Goal: Level of anxiety will decrease Outcome: Progressing   Problem: Elimination: Goal: Will not experience complications related to bowel motility Outcome: Progressing Goal: Will not experience complications related to urinary retention Outcome: Progressing   Problem: Pain Managment: Goal: General experience of comfort will improve Outcome: Progressing   Problem: Safety: Goal: Ability to remain free from injury will improve Outcome: Progressing   Problem: Skin Integrity: Goal: Risk for impaired skin integrity will decrease Outcome: Progressing   Problem: Education: Goal: Ability to describe self-care measures that may prevent or decrease complications (Diabetes Survival Skills Education) will improve Outcome: Progressing Goal: Individualized Educational Video(s) Outcome: Progressing   Problem: Coping: Goal: Ability to adjust to condition or change in health will improve Outcome: Progressing   Problem: Fluid Volume: Goal: Ability to maintain a balanced intake and output will improve Outcome: Progressing   Problem: Health Behavior/Discharge Planning: Goal: Ability to identify and utilize available resources and services will improve Outcome: Progressing Goal: Ability to manage health-related needs will improve Outcome: Progressing   Problem: Metabolic: Goal: Ability to maintain appropriate glucose levels will improve Outcome: Progressing   Problem: Nutritional: Goal: Maintenance of adequate nutrition will improve Outcome: Progressing Goal: Progress toward achieving an optimal weight will improve Outcome: Progressing   Problem: Skin  Integrity: Goal: Risk for impaired skin integrity will decrease Outcome: Progressing   Problem: Tissue Perfusion: Goal: Adequacy of tissue perfusion will improve Outcome: Progressing   

## 2023-07-08 NOTE — Progress Notes (Signed)
[facility]  PROGRESS NOTE  Hunter Sparks:[id number] DOB: Feb 06, 1946 DOA: 07/02/2023 PCP: Benita Stabile, MD   Brief HPI:   Recently discharged from the hospital will on June 23, 2023 after spending 2 days in the hospital for an acute stroke. 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 He has had left hemiparesis since then. D/c'd to CIR Patient was at rehab where he complained of severe abdominal pain for 2 days.  WBC was elevated 22.5.  He was sent to the hospital for further workup.  Right upper quadrant ultrasound showed acute cholecystitis.  General surgery was consulted and recommended IR place drain.  Drain placed on 8/20.    Assessment/Plan:   Acute cholecystitis - cholecystostomy drain per IR:  Percutaneous cholecystostomy drain to remain in place at least 6 weeks. Recommend fluoroscopy with injection of the drain in IR to evaluate for patency of the cystic duct. IR will follow up with the patient in approximately 6 weeks. A scheduler from our office will call the patient with a date/time of his appointment.  If the duct is patent and general surgery feels patient is stable for cholecystectomy, the drain would be removed at time of surgery. If the duct is patent and general surgery feels patient is NEVER a candidate for cholecystectomy, drain can be capped for a trial. If symptoms recur, then place to gravity bag again. If trial is successful, discuss possible removal of the drain -High risk for surgery given being on Plavix and recent stroke -Antibiotics changed based on culture: Klebsiella -Will need outpatient follow-up with general surgery to discuss timing of cholecystectomy if GOC do not change -diet started but poor PO intake-- may need cortrack   Sepsis due to acute cholecystitis -Presented with sepsis  physiology due to acute cholecystitis, -Initial lactic acid 2.9; improved to 1.2 -sepsis picture resolved  Hemiparesis affecting left side as effect of CVA -Recent CVA causing left hemiparesis -? back to the inpatient rehab once medically stable -Continue Plavix, aspirin --hold statin for now per family request  Diabetes mellitus type 2 -SSI --hold insulin due to low BS from poor PO intake  CAD -Continue aspirin, Lipitor, Lopressor  Hypothyroidism -Continue Synthroid  AKI -appears pre-renal -improving  Delirium -precautions -removed mittens  Poor PO intake -may need cortrack if continues to eat poorly and goals continue to be full treatment  Patient stating he has lived long enough and wants to die.  Will benefit from further GOC conversation with palliative care as wife hoping for recovery   Subjective   C/o itchy back    Data Reviewed:   CBG:  Recent Labs  Lab 07/07/23 1532 07/07/23 2025 07/07/23 2348 07/08/23 0350 07/08/23 0724  GLUCAP 117* 143* 110* 91 105*    SpO2: 96 % O2 Flow Rate (L/min): 2 L/min    Vitals:   07/07/23 2000 07/07/23 2300 07/07/23 2346 07/08/23 0352  BP: (!) 160/73  (!) 120/56   Pulse:   61   Resp: 20  (!) 9   Temp: 97.7 F (36.5 C) (!) 97.5 F (36.4 C) 97.8 F (36.6 C) 98.2 F (36.8 C)  TempSrc: Tympanic   Oral  SpO2:   96%   Weight:    91.2 kg      General: Appearance:     Overweight male who appears uncomfortable  Lungs:     , diminished, respirations unlabored  Heart:    Normal heart rate.   MS:   All extremities are intact.   Neurologic:   Awake, alert       Data Reviewed:  Basic Metabolic Panel: Recent Labs  Lab 07/03/23 0249 07/04/23 0425 07/05/23 0437 07/06/23 0746 07/07/23 0410 07/08/23 0635  NA 133* 134* 135 135 136 138  K 4.7 4.2 3.9 3.9 4.1 4.0  CL 100 98 99 101 97* 101  CO2 26 25 22 27 27 28   GLUCOSE 180* 144* 112* 155* 141* 116*  BUN 22 37* 50* 44* 32* 29*  CREATININE 1.30*  2.54* 2.34* 1.74* 1.53* 1.46*  CALCIUM 8.3* 7.9* 8.0* 8.2* 8.3* 8.3*  MG 1.9  --   --   --   --   --     CBC: Recent Labs  Lab 07/02/23 0520 07/02/23 1619 07/03/23 0249 07/04/23 0425 07/05/23 0710 07/06/23 0746 07/07/23 0410 07/08/23 0409  WBC 16.1*   < > 26.3* 24.2* 19.3* 16.5* 15.9* 15.8*  NEUTROABS 12.7*  --  21.8*  --   --   --   --   --   HGB 16.4   < > 14.4 12.8* 13.6 12.5* 12.9* 13.9  HCT 46.2   < > 41.6 36.9* 39.9 37.6* 38.3* 42.0  MCV 90.6   < > 89.3 92.9 94.1 91.5 91.0 92.3  PLT 231   < > 227 175 189 198 199 195   < > = values in this interval not displayed.    LFT Recent Labs  Lab 07/03/23 0249 07/04/23 0425 07/05/23 0437 07/07/23 0410 07/08/23 0635  AST 20 99* 114* 59* 72*  ALT 16 54* 51* 38 44  ALKPHOS 65 138* 106 100 101  BILITOT 1.4* 0.8 0.8 0.6 0.8  PROT 6.2* 5.2* 5.8* 5.5* 5.4*  ALBUMIN 2.8* 2.1* 2.4* 2.1* 2.0*         DVT prophylaxis: Heparin  Code Status: Full code  Family Communication: called wife   CONSULTS  General Surgery IR         Joseph Art   [facility] If 7PM-7AM, please contact night-coverage at [email or web address],   07/08/2023, 8:45 AM  LOS: 6 days

## 2023-07-08 NOTE — Plan of Care (Signed)
  Problem: Education: Goal: Knowledge of disease or condition will improve Outcome: Progressing Goal: Knowledge of secondary prevention will improve (MUST DOCUMENT ALL) Outcome: Progressing   Problem: Self-Care: Goal: Ability to participate in self-care as condition permits will improve Outcome: Progressing Goal: Verbalization of feelings and concerns over difficulty with self-care will improve Outcome: Progressing

## 2023-07-09 ENCOUNTER — Inpatient Hospital Stay (HOSPITAL_COMMUNITY): Payer: PPO

## 2023-07-09 LAB — CBC
HCT: 38.8 % — ABNORMAL LOW (ref 39.0–52.0)
Hemoglobin: 12.9 g/dL — ABNORMAL LOW (ref 13.0–17.0)
MCH: 30.4 pg (ref 26.0–34.0)
MCHC: 33.2 g/dL (ref 30.0–36.0)
MCV: 91.5 fL (ref 80.0–100.0)
Platelets: 224 10*3/uL (ref 150–400)
RBC: 4.24 MIL/uL (ref 4.22–5.81)
RDW: 12.6 % (ref 11.5–15.5)
WBC: 14.8 10*3/uL — ABNORMAL HIGH (ref 4.0–10.5)
nRBC: 0 % (ref 0.0–0.2)

## 2023-07-09 LAB — COMPREHENSIVE METABOLIC PANEL
ALT: 42 U/L (ref 0–44)
AST: 52 U/L — ABNORMAL HIGH (ref 15–41)
Albumin: 2.1 g/dL — ABNORMAL LOW (ref 3.5–5.0)
Alkaline Phosphatase: 105 U/L (ref 38–126)
Anion gap: 11 (ref 5–15)
BUN: 25 mg/dL — ABNORMAL HIGH (ref 8–23)
CO2: 28 mmol/L (ref 22–32)
Calcium: 8.4 mg/dL — ABNORMAL LOW (ref 8.9–10.3)
Chloride: 98 mmol/L (ref 98–111)
Creatinine, Ser: 1.47 mg/dL — ABNORMAL HIGH (ref 0.61–1.24)
GFR, Estimated: 49 mL/min — ABNORMAL LOW (ref >60)
Glucose, Bld: 130 mg/dL — ABNORMAL HIGH (ref 70–99)
Potassium: 3.8 mmol/L (ref 3.5–5.1)
Sodium: 137 mmol/L (ref 135–145)
Total Bilirubin: 0.8 mg/dL (ref 0.3–1.2)
Total Protein: 5.5 g/dL — ABNORMAL LOW (ref 6.5–8.1)

## 2023-07-09 LAB — GLUCOSE, CAPILLARY
Glucose-Capillary: 124 mg/dL — ABNORMAL HIGH (ref 70–99)
Glucose-Capillary: 137 mg/dL — ABNORMAL HIGH (ref 70–99)
Glucose-Capillary: 152 mg/dL — ABNORMAL HIGH (ref 70–99)
Glucose-Capillary: 204 mg/dL — ABNORMAL HIGH (ref 70–99)
Glucose-Capillary: 243 mg/dL — ABNORMAL HIGH (ref 70–99)
Glucose-Capillary: 255 mg/dL — ABNORMAL HIGH (ref 70–99)

## 2023-07-09 MED ORDER — HYDRALAZINE HCL 20 MG/ML IJ SOLN
10.0000 mg | Freq: Four times a day (QID) | INTRAMUSCULAR | Status: DC | PRN
  Administered 2023-07-09 – 2023-07-11 (×4): 10 mg via INTRAVENOUS
  Filled 2023-07-09 (×4): qty 1

## 2023-07-09 MED ORDER — HYDROMORPHONE HCL 1 MG/ML IJ SOLN
0.5000 mg | Freq: Once | INTRAMUSCULAR | Status: AC
  Administered 2023-07-09: 0.5 mg via INTRAVENOUS
  Filled 2023-07-09: qty 0.5

## 2023-07-09 MED ORDER — BISACODYL 10 MG RE SUPP: 10.0000 mg | Freq: Every day | RECTAL | Status: DC | PRN

## 2023-07-09 NOTE — Progress Notes (Signed)
Calorie Count Note: Day 1 Results  48-hour calorie count ordered to start yesterday 07/08/23 at lunch meal. Pt's wife in room and feels like pt's PO intake has improved dramatically over the last 24 hours.  Diet: dysphagia 3 with thin liquids Supplements:  - Ensure Enlive po TID, each supplement provides 350 kcal and 20 grams of protein - Magic Cup TID with meals, each supplement provides 290 kcal and 9 grams of protein  Day 1: 8/25 Lunch: 254 kcal, 5 grams of protein 8/25 Dinner: 150 kcal, 5 grams of protein (estimated, food from home) 8/26 Breakfast: 350 kcal, 10 grams of protein Supplements: 350 kcal, 20 grams of protein  Day 1 total 24-hour intake: 1104 kcal (55% of minimum estimated needs)  40 grams protein (40% of minimum estimated needs)  Nutrition Diagnosis: Severe Malnutrition related to acute illness (recent CVA, sepsis, acute cholecystitis) as evidenced by mild fat depletion, moderate muscle depletion, percent weight loss (11.3% weight loss in < 1 month).  Goal: Patient will meet greater than or equal to 90% of their needs.  Intervention:  - Continue 48-hour calorie count for 1 more day - Continue ongoing GOC discussions regarding nutrition support - Continue Ensure Enlive TID - Continue Magic Cup TID   Mertie Clause, MS, RD, LDN Registered Dietitian II Please see AMiON for contact information.

## 2023-07-09 NOTE — Progress Notes (Signed)
This chaplain responded to PMT-NP [facility] consult for spiritual care. The Pt. is awake in the bedside chair. The Pt. wife-Linda is visiting.   The chaplain began rapport building and reflective listening as [name] participated in storytelling. Linda invited the Pt. to add to the conversation. The Pt. responded with the number of grandchildren.  Otherwise the conversation was limited to mumbling while the Pt. followed the chaplain's presence in the room.  The chaplain understands from [place] the Pt. clarity is improving along with his appetite. [name] shares the Pt. ate a few bites of lunch and almost an entire [employer]. [name] is hopeful for increased appetite and mobility, allowing him to return to CIR.  The Pt. two sons visited yesterday and provide support for [facility] as necessary. The Pt. also has family who are are clergy.  The chaplain updated the Pt. RN-Teresa of the Pt.'s request to return to the bed.  This chaplain is available for F/U spiritual care as needed.  Chaplain Stephanie Acre [phone]

## 2023-07-09 NOTE — Progress Notes (Signed)
Palliative Medicine Inpatient Follow Up Note   HPI: Recently discharged from the hospital will on June 23, 2023 after spending 2 days in the hospital for an acute stroke. 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 He has had left hemiparesis since then. D/c'd to CIR   Patient was at rehab where he complained of severe abdominal pain for 2 days.  WBC was elevated 22.5.  He was sent to the hospital for further workup.  Right upper quadrant ultrasound showed acute cholecystitis.  General surgery was consulted and recommended IR place drain.  Drain placed on 8/20.   Palliative care has been asked to get involved to further support goals of care conversations as patient has told nursing staff as well as medical staff that he no longer wants to live.  Today's Discussion 07/09/2023  *Please note that this is a verbal dictation therefore any spelling or grammatical errors are due to the "Dragon [employer] One" system interpretation.  Chart reviewed inclusive of vital signs, progress notes, laboratory results, and diagnostic images.   I spoke to [place] this morning. He is more alert and oriented. He is aware of where we are and why he is here. He and I discussed that he remains to have back and abdominal pain.   I met with Savyon's spouse, [name] this morning. They were discussing Vane's oral intake which has been roughly about half of what it should be. I observed education being provided.   Gerilyn Nestle, and I were able to discuss Sun's present health and wishes for the future. At this time Mihai would like to be a DNAR/DNI. He does not want artificial life prolonging measures.  Created space and opportunity for patient to explore thoughts feelings and fears regarding current medical situation. He does express hope for mobility improvement.  Discussed the purpose of CIR to help regain physical ability for care. Hubert inhibited during conversation due to abdominal pain. I shared I would alert the primary medical team for possible imaging. Nursing was able to provide his tramadol and simethicone also.   Questions and concerns addressed/Palliative Support Provided.   Objective Assessment: Vital Signs Vitals:   07/09/23 0959 07/09/23 1124  BP: (!) 137/51 (!) 149/64  Pulse: 86 79  Resp: 17 14  Temp:  98.1 F (36.7 C)  SpO2: 92% 93%    Intake/Output Summary (Last 24 hours) at 07/09/2023 1154 Last data filed at 07/09/2023 1125 Gross per 24 hour  Intake 615 ml  Output 2350 ml  Net -1735 ml   Last Weight  Most recent update: 07/08/2023  3:53 AM    Weight  91.2 kg (201 lb 1 oz)            Gen: Elderly Caucasian male chronically ill in appearance HEENT: Dry mucous membranes CV: Regular rate and rhythm  PULM: On room air breathing is even and nonlabored ABD: moderate tenderness EXT: No edema Neuro: Alert and oriented x2  SUMMARY OF RECOMMENDATIONS   DNAR/DNI  Symptom management as below   Goals are for improvement to the point whereby patient can go back to CIR for continued rehabilitation poststroke   Ongoing palliative care support Symptom Management:  Chronic lower back pain: -Acetaminophen 1 g 3 times daily -Tramadol 50 mg 3 times daily -Lidoderm 1 patch to lower back -Oxycodone 5 to 10 mg every 4 hours for  breakthrough pain  Abdominal Pain: -KUB per primary -Simethicone Q6H PRN   Adult failure to thrive: -Dietitian actively involved -Have requested a 48 hr calorie count (1 more day) -Encouraged family to bring food from home -Ongoing discussions related to short-term artificial nutrition   Delirium: -Reorientation measures/protocol -Olanzapine 5 mg at 5 PM nightly   Spiritual support: -Chaplain consult ordered  Billing based on MDM: High  Problems Addressed: One acute or chronic illness or  injury that poses a threat to life or bodily function  Amount and/or Complexity of Data: Category 3:Discussion of management or test interpretation with external physician/other qualified health care professional/appropriate source (not separately reported)  Risks: Decision regarding hospitalization or escalation of hospital care and Decision not to resuscitate or to de-escalate care because of poor prognosis ______________________________________________________________________________________ Lamarr Lulas [place] Palliative Medicine Team Team Cell Phone: [phone] Please utilize secure chat with additional questions, if there is no response within 30 minutes please call the above phone number  Palliative Medicine Team providers are available by phone from 7am to 7pm daily and can be reached through the team cell phone.  Should this patient require assistance outside of these hours, please call the patient's attending physician.

## 2023-07-09 NOTE — Plan of Care (Signed)
  Problem: Education: Goal: Knowledge of disease or condition will improve Outcome: Progressing Goal: Knowledge of secondary prevention will improve (MUST DOCUMENT ALL) Outcome: Progressing Goal: Knowledge of patient specific risk factors will improve (Mark N/A or DELETE if not current risk factor) Outcome: Progressing   Problem: Ischemic Stroke/TIA Tissue Perfusion: Goal: Complications of ischemic stroke/TIA will be minimized Outcome: Progressing   Problem: Coping: Goal: Will verbalize positive feelings about self Outcome: Progressing Goal: Will identify appropriate support needs Outcome: Progressing   Problem: Health Behavior/Discharge Planning: Goal: Ability to manage health-related needs will improve Outcome: Progressing Goal: Goals will be collaboratively established with patient/family Outcome: Progressing   Problem: Self-Care: Goal: Ability to participate in self-care as condition permits will improve Outcome: Progressing Goal: Verbalization of feelings and concerns over difficulty with self-care will improve Outcome: Progressing Goal: Ability to communicate needs accurately will improve Outcome: Progressing   Problem: Nutrition: Goal: Risk of aspiration will decrease Outcome: Progressing Goal: Dietary intake will improve Outcome: Progressing   Problem: Education: Goal: Knowledge of General Education information will improve Description: Including pain rating scale, medication(s)/side effects and non-pharmacologic comfort measures Outcome: Progressing   Problem: Health Behavior/Discharge Planning: Goal: Ability to manage health-related needs will improve Outcome: Progressing   Problem: Clinical Measurements: Goal: Ability to maintain clinical measurements within normal limits will improve Outcome: Progressing Goal: Will remain free from infection Outcome: Progressing Goal: Diagnostic test results will improve Outcome: Progressing Goal: Respiratory  complications will improve Outcome: Progressing Goal: Cardiovascular complication will be avoided Outcome: Progressing   Problem: Activity: Goal: Risk for activity intolerance will decrease Outcome: Progressing   Problem: Nutrition: Goal: Adequate nutrition will be maintained Outcome: Progressing   Problem: Coping: Goal: Level of anxiety will decrease Outcome: Progressing   Problem: Elimination: Goal: Will not experience complications related to bowel motility Outcome: Progressing Goal: Will not experience complications related to urinary retention Outcome: Progressing   Problem: Pain Managment: Goal: General experience of comfort will improve Outcome: Progressing   Problem: Safety: Goal: Ability to remain free from injury will improve Outcome: Progressing   Problem: Skin Integrity: Goal: Risk for impaired skin integrity will decrease Outcome: Progressing   Problem: Education: Goal: Ability to describe self-care measures that may prevent or decrease complications (Diabetes Survival Skills Education) will improve Outcome: Progressing Goal: Individualized Educational Video(s) Outcome: Progressing   Problem: Coping: Goal: Ability to adjust to condition or change in health will improve Outcome: Progressing   Problem: Fluid Volume: Goal: Ability to maintain a balanced intake and output will improve Outcome: Progressing   Problem: Health Behavior/Discharge Planning: Goal: Ability to identify and utilize available resources and services will improve Outcome: Progressing Goal: Ability to manage health-related needs will improve Outcome: Progressing   Problem: Metabolic: Goal: Ability to maintain appropriate glucose levels will improve Outcome: Progressing   Problem: Nutritional: Goal: Maintenance of adequate nutrition will improve Outcome: Progressing Goal: Progress toward achieving an optimal weight will improve Outcome: Progressing   Problem: Skin  Integrity: Goal: Risk for impaired skin integrity will decrease Outcome: Progressing   Problem: Tissue Perfusion: Goal: Adequacy of tissue perfusion will improve Outcome: Progressing   

## 2023-07-09 NOTE — Progress Notes (Signed)
Patient called me to room slightly more confused then this morning but still knows he is at [facility]. Says he can't breathe, sats 100%. Appears anxious, son at bedside. Hydalazine prn given prior for increased BP 180s. Dr Benjamine Mola notified of these findings. Suggested prn oxy and also new order for dilaudid received if oxy doesn't help. Will continue to monitor.

## 2023-07-09 NOTE — Progress Notes (Addendum)
[facility]  PROGRESS NOTE  Hunter Sparks:[id number] DOB: [birth year] DOA: 07/02/2023 PCP: Benita Stabile, MD   Brief HPI:   Recently discharged from the hospital will on June 23, 2023 after spending 2 days in the hospital for an acute stroke. 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 He has had left hemiparesis since then. D/c'd to CIR Patient was at rehab where he complained of severe abdominal pain for 2 days.  WBC was elevated 22.5.  He was sent to the hospital for further workup.  Right upper quadrant ultrasound showed acute cholecystitis.  General surgery was consulted and recommended IR place drain.  Drain placed on 8/20.  Hospital stay complicated by delirium and poor PO intake-- may need cortrack.  Slow to improve.  Plan back to CIR when able.      Assessment/Plan:   Acute cholecystitis - cholecystostomy drain per IR:  Percutaneous cholecystostomy drain to remain in place at least 6 weeks. Recommend fluoroscopy with injection of the drain in IR to evaluate for patency of the cystic duct. IR will follow up with the patient in approximately 6 weeks. A scheduler from our office will call the patient with a date/time of his appointment.  If the duct is patent and general surgery feels patient is stable for cholecystectomy, the drain would be removed at time of surgery. If the duct is patent and general surgery feels patient is NEVER a candidate for cholecystectomy, drain can be capped for a trial. If symptoms recur, then place to gravity bag again. If trial is successful, discuss possible removal of the drain -High risk for surgery given being on Plavix and recent stroke -Antibiotics changed based on culture: Klebsiella -Will need outpatient follow-up with general surgery to discuss timing of cholecystectomy if GOC do not  change -diet started but poor PO intake-- may need cortrack  -dg abd pending  Sepsis due to acute cholecystitis -Presented with sepsis physiology due to acute cholecystitis, -Initial lactic acid 2.9; improved to 1.2 -sepsis picture resolved  Hemiparesis affecting left side as effect of CVA -Recent CVA causing left hemiparesis -? back to the inpatient rehab once medically stable -Continue Plavix, aspirin --hold statin for now per family request  Diabetes mellitus type 2 -SSI --hold insulin due to low BS from poor PO intake  CAD -Continue aspirin, Lipitor, Lopressor  Hypothyroidism -Continue Synthroid  AKI -appears pre-renal -improving  Delirium -precautions -removed mittens  Poor PO intake -may need cortrack if continues to eat poorly and goals continue to be full treatment    Subjective   Up visiting with family-- c/o abdominal pain    Data Reviewed:   CBG:  Recent Labs  Lab 07/08/23 1615 07/08/23 1916 07/08/23 2331 07/09/23 0332 07/09/23 0748  GLUCAP 181* 149* 110* 137* 124*    SpO2: 92 % O2 Flow Rate (L/min): 2 L/min    Vitals:   07/08/23 2332 07/09/23 0327 07/09/23 0752 07/09/23 0959  BP:  (!) 155/63 (!) 195/91 (!) 137/51  Pulse:   87 86  Resp:   20 17  Temp: 97.6 F (36.4 C) 97.9 F (36.6 C) 98.3 F (36.8 C)   TempSrc:  Oral Oral   SpO2:   93% 92%  Weight:          General: Appearance:     Overweight male who appears uncomfortable  Lungs:     , diminished, respirations unlabored  Heart:    Normal heart rate.   MS:   All extremities are intact.   Neurologic:   Awake, alert       Data Reviewed:  Basic Metabolic Panel: Recent Labs  Lab 07/03/23 0249 07/04/23 0425 07/05/23 0437 07/06/23 0746 07/07/23 0410 07/08/23 0635 07/09/23 0554  NA 133*   < > 135 135 136 138 137  K 4.7   < > 3.9 3.9 4.1 4.0 3.8  CL 100   < > 99 101 97* 101 98  CO2 26   < > 22 27 27 28 28   GLUCOSE 180*   < > 112* 155* 141* 116* 130*  BUN 22    < > 50* 44* 32* 29* 25*  CREATININE 1.30*   < > 2.34* 1.74* 1.53* 1.46* 1.47*  CALCIUM 8.3*   < > 8.0* 8.2* 8.3* 8.3* 8.4*  MG 1.9  --   --   --   --   --   --    < > = values in this interval not displayed.    CBC: Recent Labs  Lab 07/03/23 0249 07/04/23 0425 07/05/23 0710 07/06/23 0746 07/07/23 0410 07/08/23 0409 07/09/23 0554  WBC 26.3*   < > 19.3* 16.5* 15.9* 15.8* 14.8*  NEUTROABS 21.8*  --   --   --   --   --   --   HGB 14.4   < > 13.6 12.5* 12.9* 13.9 12.9*  HCT 41.6   < > 39.9 37.6* 38.3* 42.0 38.8*  MCV 89.3   < > 94.1 91.5 91.0 92.3 91.5  PLT 227   < > 189 198 199 195 224   < > = values in this interval not displayed.    LFT Recent Labs  Lab 07/04/23 0425 07/05/23 0437 07/07/23 0410 07/08/23 0635 07/09/23 0554  AST 99* 114* 59* 72* 52*  ALT 54* 51* 38 44 42  ALKPHOS 138* 106 100 101 105  BILITOT 0.8 0.8 0.6 0.8 0.8  PROT 5.2* 5.8* 5.5* 5.4* 5.5*  ALBUMIN 2.1* 2.4* 2.1* 2.0* 2.1*         DVT prophylaxis: Heparin  Code Status: DNR  Family Communication: wife at bedside   CONSULTS  General Surgery IR Palliative care         Joseph Art   [facility] If 7PM-7AM, please contact night-coverage at [email or web address],   07/09/2023, 11:11 AM  LOS: 6 days

## 2023-07-09 NOTE — Consult Note (Signed)
   [facility] CM Inpatient Consult   07/09/2023  Hunter Sparks 10/05/1946 [id number]  [facility] [[facility]]  Accountable Care Organization [ACO] Patient: [employer] insurance  Primary Care Provider:  Benita Stabile, MD  is listed to provide the transition of care follow up appointments  Patient screened for less than 30 days readmission hospitalization with noted high risk score for unplanned readmission risk with a 6 day length of stay and to assess for potential [facility]  [[facility]] Care Management service needs for post hospital transition for care coordination.  Review of patient's electronic medical record reveals patient is currently noted for plans to return to CIR with insurance authorization needed.  Reviewed for progress and disposition needs.   Plan:  Continue to follow progress and disposition to assess for post hospital community care coordination/management needs.  Referral request for community care coordination: pending disposition with Level of Care needs to be determined.  Of note, [facility] Care Management/Population Health does not replace or interfere with any arrangements made by the Inpatient Transition of Care team.  For questions contact:   Charlesetta Shanks, RN BSN CCM [facility]  [phone] business mobile phone Toll free office [phone]  Fax number: [phone] [email or web address]

## 2023-07-09 NOTE — Progress Notes (Signed)
Physical Therapy Treatment Patient Details Name: Hunter Sparks MRN: [id number] DOB: Mar 29, 1946 Today's Date: 07/09/2023   History of Present Illness Pt is 77 yo male who presents on 07/02/23 from CIR with acute abdominal pain, underwent drain placement on 07/03/23, to have cholecystectomy soon. 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 Needed cueing but able to state grandson's name and state where he was. Following commands with increased time. Pt required mod A +2 to come to EOB with more assist needed on L side due to hemiplegia. Stood to stedy with max A +2. Worked on hip extension in perched sitting and in standing which was difficult for pt.  Expect pt will tolerate return to AIR soon. PT will continue to follow.    If plan is discharge home, recommend the following: Help with stairs or ramp for entrance;Assistance with cooking/housework;Two people to help with walking and/or transfers;Supervision due to cognitive status;Assist for transportation;Direct supervision/assist for financial management;Assistance with feeding;Direct supervision/assist for medications management;Two people to help with bathing/dressing/bathroom   Can travel by private vehicle        Equipment Recommendations  Other (comment) (TBD)    Recommendations for Other Services Rehab consult     Precautions / Restrictions Precautions Precautions: Fall;Other (comment) Precaution Comments: left mild hemiparesis, confusion Restrictions Weight Bearing Restrictions: No Other Position/Activity Restrictions: R sided percutaneous drain     Mobility  Bed Mobility Overal bed mobility: Needs Assistance Bed Mobility: Supine to Sit      Supine to sit: Mod assist, HOB elevated, +2 for physical assistance     General bed mobility comments: increased time, cues for sequencing, assist with LLE and trunk    Transfers Overall transfer level: Needs assistance Equipment used: Ambulation equipment used Transfers: Sit to/from Stand, Bed to chair/wheelchair/BSC Sit to Stand: Max assist, +2 physical assistance           General transfer comment: pt needed max A +2 for power up and maintained flexed trunk in stedy, L lean Transfer via Lift Equipment: Stedy  Ambulation/Gait               General Gait Details: unable to step feet in standing   Stairs             Wheelchair Mobility     Tilt Bed    Modified Rankin (Stroke Patients Only) Modified Rankin (Stroke Patients Only) Pre-Morbid Rankin Score: No symptoms Modified Rankin: Severe disability     Balance Overall balance assessment: Needs assistance Sitting-balance support: Feet supported, No upper extremity supported Sitting balance-Leahy Scale: Poor Sitting balance - Comments: L lean, min A in sitting Postural control: Left lateral lean Standing balance support: During functional activity, Bilateral upper extremity supported, Reliant on assistive device for balance Standing balance-Leahy Scale: Poor Standing balance comment: max A on L side in standing                            Cognition Arousal: Alert Behavior During Therapy: WFL for tasks assessed/performed Overall Cognitive Status: Impaired/Different from baseline Area of Impairment: Orientation, Attention, Memory, Following commands, Safety/judgement, Awareness, Problem solving  Orientation Level: Disoriented to, Time, Situation Current Attention Level: Sustained Memory: Decreased short-term memory Following Commands: Follows one step commands with increased time, Follows one step commands inconsistently Safety/Judgement: Decreased awareness of  safety Awareness: Intellectual Problem Solving: Slow processing, Decreased initiation, Difficulty sequencing, Requires verbal cues, Requires tactile cues General Comments: remains confused but is clearing        Exercises      General Comments General comments (skin integrity, edema, etc.): VSS. Wife and grandson present.      Pertinent Vitals/Pain Pain Assessment Pain Assessment: Faces Faces Pain Scale: Hurts little more Pain Location: abdomen Pain Descriptors / Indicators: Sore, Grimacing, Cramping Pain Intervention(s): Monitored during session, Limited activity within patient's tolerance    Home Living                          Prior Function            PT Goals (current goals can now be found in the care plan section) Acute Rehab PT Goals Patient Stated Goal: go back to rehab PT Goal Formulation: With patient/family Time For Goal Achievement: 07/19/23 Potential to Achieve Goals: Good Progress towards PT goals: Progressing toward goals    Frequency    Min 1X/week      PT Plan      Co-evaluation              AM-PAC PT "6 Clicks" Mobility   Outcome Measure  Help needed turning from your back to your side while in a flat bed without using bedrails?: A Lot Help needed moving from lying on your back to sitting on the side of a flat bed without using bedrails?: A Lot Help needed moving to and from a bed to a chair (including a wheelchair)?: Total Help needed standing up from a chair using your arms (e.g., wheelchair or bedside chair)?: Total Help needed to walk in hospital room?: Total Help needed climbing 3-5 steps with a railing? : Total 6 Click Score: 8    End of Session Equipment Utilized During Treatment: Gait belt Activity Tolerance: Patient tolerated treatment well Patient left: in chair;with call bell/phone within reach;with chair alarm set;with family/visitor present Nurse Communication: Mobility status;Need for lift equipment  (stedy vs maximove) PT Visit Diagnosis: Unsteadiness on feet (R26.81);Other abnormalities of gait and mobility (R26.89);Muscle weakness (generalized) (M62.81);Hemiplegia and hemiparesis Hemiplegia - Right/Left: Left Hemiplegia - dominant/non-dominant: Non-dominant Hemiplegia - caused by: Cerebral infarction     Time: 1012-1049 PT Time Calculation (min) (ACUTE ONLY): 37 min  Charges:    $Therapeutic Activity: 23-37 mins PT General Charges $$ ACUTE PT VISIT: 1 Visit                     Lyanne Co, PT  Acute Rehab Services Secure chat preferred Office [phone]    [place] L Larissa Pegg 07/09/2023, 12:00 PM

## 2023-07-10 LAB — CBC
HCT: 39.1 % (ref 39.0–52.0)
Hemoglobin: 13.3 g/dL (ref 13.0–17.0)
MCH: 30.3 pg (ref 26.0–34.0)
MCHC: 34 g/dL (ref 30.0–36.0)
MCV: 89.1 fL (ref 80.0–100.0)
Platelets: 262 10*3/uL (ref 150–400)
RBC: 4.39 MIL/uL (ref 4.22–5.81)
RDW: 12.4 % (ref 11.5–15.5)
WBC: 16.1 10*3/uL — ABNORMAL HIGH (ref 4.0–10.5)
nRBC: 0 % (ref 0.0–0.2)

## 2023-07-10 LAB — GLUCOSE, CAPILLARY
Glucose-Capillary: 154 mg/dL — ABNORMAL HIGH (ref 70–99)
Glucose-Capillary: 139 mg/dL — ABNORMAL HIGH (ref 70–99)
Glucose-Capillary: 156 mg/dL — ABNORMAL HIGH (ref 70–99)
Glucose-Capillary: 165 mg/dL — ABNORMAL HIGH (ref 70–99)
Glucose-Capillary: 176 mg/dL — ABNORMAL HIGH (ref 70–99)

## 2023-07-10 LAB — BASIC METABOLIC PANEL
Anion gap: 12 (ref 5–15)
BUN: 17 mg/dL (ref 8–23)
CO2: 26 mmol/L (ref 22–32)
Calcium: 8.3 mg/dL — ABNORMAL LOW (ref 8.9–10.3)
Chloride: 97 mmol/L — ABNORMAL LOW (ref 98–111)
Creatinine, Ser: 1.33 mg/dL — ABNORMAL HIGH (ref 0.61–1.24)
GFR, Estimated: 55 mL/min — ABNORMAL LOW (ref >60)
Glucose, Bld: 141 mg/dL — ABNORMAL HIGH (ref 70–99)
Potassium: 3.4 mmol/L — ABNORMAL LOW (ref 3.5–5.1)
Sodium: 135 mmol/L (ref 135–145)

## 2023-07-10 MED ORDER — POTASSIUM CHLORIDE CRYS ER 20 MEQ PO TBCR
40.0000 meq | EXTENDED_RELEASE_TABLET | Freq: Once | ORAL | Status: AC
  Administered 2023-07-10: 40 meq via ORAL
  Filled 2023-07-10: qty 2

## 2023-07-10 MED ORDER — INSULIN ASPART 100 UNIT/ML IJ SOLN
0.0000 [IU] | Freq: Three times a day (TID) | INTRAMUSCULAR | Status: DC
  Administered 2023-07-10 – 2023-07-11 (×3): 2 [IU] via SUBCUTANEOUS
  Administered 2023-07-11: 5 [IU] via SUBCUTANEOUS
  Administered 2023-07-11: 2 [IU] via SUBCUTANEOUS
  Administered 2023-07-12: 1 [IU] via SUBCUTANEOUS
  Administered 2023-07-12: 2 [IU] via SUBCUTANEOUS
  Administered 2023-07-12: 3 [IU] via SUBCUTANEOUS
  Administered 2023-07-13: 1 [IU] via SUBCUTANEOUS
  Administered 2023-07-13 (×2): 2 [IU] via SUBCUTANEOUS
  Administered 2023-07-14 (×2): 1 [IU] via SUBCUTANEOUS

## 2023-07-10 MED ORDER — SODIUM CHLORIDE 0.9 % IV SOLN: INTRAVENOUS | Status: DC | PRN

## 2023-07-10 MED ORDER — INSULIN ASPART 100 UNIT/ML IJ SOLN
0.0000 [IU] | Freq: Every day | INTRAMUSCULAR | Status: DC
  Administered 2023-07-11: 2 [IU] via SUBCUTANEOUS

## 2023-07-10 MED ORDER — HALOPERIDOL LACTATE 5 MG/ML IJ SOLN
2.0000 mg | Freq: Once | INTRAMUSCULAR | Status: AC
  Administered 2023-07-10: 2 mg via INTRAVENOUS
  Filled 2023-07-10: qty 1

## 2023-07-10 MED ORDER — METOPROLOL TARTRATE 50 MG PO TABS
50.0000 mg | ORAL_TABLET | Freq: Two times a day (BID) | ORAL | Status: DC
  Administered 2023-07-11 – 2023-07-14 (×7): 50 mg via ORAL
  Filled 2023-07-10 (×8): qty 1

## 2023-07-10 NOTE — Progress Notes (Signed)
SLP Cancellation Note  Patient Details Name: Hunter Sparks MRN: [id number] DOB: 16-Aug-1946   Cancelled treatment:       Reason Eval/Treat Not Completed: Other (comment). Pt tolerating meals, Wife wants to continue soft diet. Currently pt napping. SLP will sign off acutely. May benefit from reassessment of cognition on AIR    Amyria Komar, Riley Nearing 07/10/2023, 11:06 AM

## 2023-07-10 NOTE — Progress Notes (Signed)
Calorie Count Note: Day 2 Results  48-hour calorie count ordered to start on 07/08/23 at lunch meal. Please see day 2 results below. Pt's wife feels that pt's PO intake has improved markedly since initial concern was expressed. She is hopeful for discharge back to CIR as she thinks this will help tremendously.  Diet: dysphagia 3 with thin liquids Supplements:  - Ensure Enlive po TID, each supplement provides 350 kcal and 20 grams of protein - Magic Cup TID with meals, each supplement provides 290 kcal and 9 grams of protein  Day 1: 8/26 Lunch: 527 kcal, 17 grams of protein 8/26 Dinner: 200 kcal, 9 grams of protein 8/27 Breakfast: 0 kcal, 0 grams of protein (pt did not eat because of a headache) Supplements: 525 kcal, 30 grams of protein  Day 2 total 24-hour intake: 1252 kcal (63% of minimum estimated needs)  56 grams protein (56% of minimum estimated needs)  Nutrition Diagnosis: Severe Malnutrition related to acute illness (recent CVA, sepsis, acute cholecystitis) as evidenced by mild fat depletion, moderate muscle depletion, percent weight loss (11.3% weight loss in < 1 month).  Goal: Patient will meet greater than or equal to 90% of their needs.  Intervention:  - d/c 48-hour calorie count - Continue Ensure Enlive TID - Continue Magic Cup TID - Encourage PO intake and provide feeding assistance as needed   Mertie Clause, MS, RD, LDN Registered Dietitian II Please see AMiON for contact information.

## 2023-07-10 NOTE — Progress Notes (Addendum)
Palliative Medicine Inpatient Follow Up Note HPI: Recently discharged from the hospital will on June 23, 2023 after spending 2 days in the hospital for an acute stroke. 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 He has had left hemiparesis since then. D/c'd to CIR   Patient was at rehab where he complained of severe abdominal pain for 2 days.  WBC was elevated 22.5.  He was sent to the hospital for further workup.  Right upper quadrant ultrasound showed acute cholecystitis.  General surgery was consulted and recommended IR place drain.  Drain placed on 8/20.   Palliative care has been asked to get involved to further support goals of care conversations as patient has told nursing staff as well as medical staff that he no longer wants to live.  Today's Discussion 07/10/2023  *Please note that this is a verbal dictation therefore any spelling or grammatical errors are due to the "Dragon [employer] One" system interpretation.  Chart reviewed inclusive of vital signs, progress notes, laboratory results, and diagnostic images.   I spoke to Barnwell this morning. He shares that he is feeling unwell. When I asked why he shares he feels breathing is difficult. I provided his oxygen as his nasal cannula had fallen out and checked his SPO2 sat which was within limits. I stayed at bedside and he was able to calm. He shares a desire to get out of bed. I stated that we will try to support this with nursing staff.   Reviewed Demarie's wishes for the future inclusive of his ability to mobilize more freely again. Provided support in the setting of the difficulty associated with Tyray's recent stroke and the possible long term effects it may have on his body.   Questions and concerns addressed/Palliative Support Provided.   Objective Assessment: Vital  Signs Vitals:   07/10/23 0335 07/10/23 0738  BP: (!) 166/72 (!) 163/79  Pulse: 97 100  Resp: 19 17  Temp: 98.5 F (36.9 C) 98.2 F (36.8 C)  SpO2: 96% 97%    Intake/Output Summary (Last 24 hours) at 07/10/2023 1104 Last data filed at 07/10/2023 0548 Gross per 24 hour  Intake 650 ml  Output 1930 ml  Net -1280 ml   Last Weight  Most recent update: 07/10/2023  3:39 AM    Weight  93.5 kg (206 lb 2.1 oz)            Gen: Elderly Caucasian male chronically ill in appearance HEENT: Dry mucous membranes CV: Regular rate and rhythm  PULM: On room air breathing is even and nonlabored ABD: moderate tenderness EXT: No edema Neuro: Alert and oriented x2  SUMMARY OF RECOMMENDATIONS   DNAR/DNI   Goals are for improvement to the point whereby patient can go back to CIR for continued rehabilitation poststroke   Per Dr. Benjamine Mola, family has requested we do not remain involved in patient care - We will sign off at this time  Billing based on [id number]  ______________________________________________________________________________________ Lamarr Lulas [facility] Palliative Medicine Team Team Cell Phone: [phone] Please utilize secure chat with additional questions, if there is no response within 30 minutes please call the above phone number  Palliative Medicine Team providers are available by phone from 7am to 7pm daily and can be reached through the team cell phone.  Should this patient require assistance outside of these hours, please call  the patient's attending physician.

## 2023-07-10 NOTE — Progress Notes (Signed)
Inpatient Rehabilitation Admissions Coordinator   I met at bedside with patient and his wife. I await insurance approval for possible readmit to CIR. Wife has requested palliative care to no longer be involved. She feels she and family have ample supports and understanding of his long term care needs and wishes.  Ottie Glazier, RN, MSN Rehab Admissions Coordinator [phone] 07/10/2023 11:12 AM

## 2023-07-10 NOTE — Progress Notes (Signed)
Inpatient Rehabilitation Admissions Coordinator   I have requested Dr Shearon Stalls to complete peer to peer with Dr Logan Bores by 10 am Wed .  Ottie Glazier, RN, MSN Rehab Admissions Coordinator [phone] 07/10/2023 4:58 PM

## 2023-07-10 NOTE — Progress Notes (Signed)
[facility]  PROGRESS NOTE  Hunter Sparks ZOX:[id number] DOB: 03-24-1946 DOA: 07/02/2023 PCP: Benita Stabile, MD   Brief HPI:   Recently discharged from the hospital will on June 23, 2023 after spending 2 days in the hospital for an acute stroke. 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 He has had left hemiparesis since then. D/c'd to CIR Patient was at rehab where he complained of severe abdominal pain for 2 days.  WBC was elevated 22.5.  He was sent to the hospital for further workup.  Right upper quadrant ultrasound showed acute cholecystitis.  General surgery was consulted and recommended IR place drain.  Drain placed on 8/20.  Hospital stay complicated by delirium and poor PO intake-- may need cortrack.  Slow to improve.  Plan back to CIR when able.      Assessment/Plan:   Acute cholecystitis - cholecystostomy drain per IR:  Percutaneous cholecystostomy drain to remain in place at least 6 weeks. Recommend fluoroscopy with injection of the drain in IR to evaluate for patency of the cystic duct. IR will follow up with the patient in approximately 6 weeks. A scheduler from our office will call the patient with a date/time of his appointment.  If the duct is patent and general surgery feels patient is stable for cholecystectomy, the drain would be removed at time of surgery. If the duct is patent and general surgery feels patient is NEVER a candidate for cholecystectomy, drain can be capped for a trial. If symptoms recur, then place to gravity bag again. If trial is successful, discuss possible removal of the drain -High risk for surgery given being on Plavix and recent stroke -Antibiotics changed based on culture: Klebsiella -Will need outpatient follow-up with general surgery to discuss timing of cholecystectomy if GOC do not  change -dg abd with ? Ileus-- improved this AM on exam-- no pain and is hungry  Sepsis due to acute cholecystitis -Presented with sepsis physiology due to acute cholecystitis, -Initial lactic acid 2.9; improved to 1.2 -sepsis picture resolved  Hemiparesis affecting left side as effect of CVA -Recent CVA causing left hemiparesis -? back to the inpatient rehab once medically stable -Continue Plavix, aspirin --hold statin for now per family request  Diabetes mellitus type 2 -SSI --hold insulin due to low BS from poor PO intake  CAD -Continue aspirin, Lipitor, Lopressor  Hypothyroidism -Continue Synthroid  AKI -appears pre-renal -improving  Delirium -precautions -removed mittens  Poor PO intake -may need cortrack if continues to eat poorly and goals continue to be full treatment Family does not want to meet with Palliative care- feel they can make decision on own   Subjective   Hungry this AM    Data Reviewed:   CBG:  Recent Labs  Lab 07/09/23 1937 07/09/23 2347 07/10/23 0353 07/10/23 0736 07/10/23 1126  GLUCAP 255* 152* 154* 139* 156*    SpO2: 95 % O2 Flow Rate (L/min): 2 L/min    Vitals:   07/10/23 0335 07/10/23 0337 07/10/23 0738 07/10/23 1128  BP: (!) 166/72  (!) 163/79 (!) 145/81  Pulse: 97  100 (!) 107  Resp: 19  17 20   Temp: 98.5 F (36.9 C)  98.2 F (36.8 C) 97.6 F (36.4 C)  TempSrc: Oral  Oral Oral  SpO2: 96%  97% 95%  Weight:  93.5 kg  General: Appearance:     Overweight male much more oriented today   Drain in place, abd soft  Lungs:    diminished, respirations unlabored  Heart:    Tachycardic.   MS:   All extremities are intact.   Neurologic:   Awake, alert       Data Reviewed:  Basic Metabolic Panel: Recent Labs  Lab 07/06/23 0746 07/07/23 0410 07/08/23 0635 07/09/23 0554 07/10/23 0635  NA 135 136 138 137 135  K 3.9 4.1 4.0 3.8 3.4*  CL 101 97* 101 98 97*  CO2 27 27 28 28 26   GLUCOSE 155* 141* 116*  130* 141*  BUN 44* 32* 29* 25* 17  CREATININE 1.74* 1.53* 1.46* 1.47* 1.33*  CALCIUM 8.2* 8.3* 8.3* 8.4* 8.3*    CBC: Recent Labs  Lab 07/06/23 0746 07/07/23 0410 07/08/23 0409 07/09/23 0554 07/10/23 0635  WBC 16.5* 15.9* 15.8* 14.8* 16.1*  HGB 12.5* 12.9* 13.9 12.9* 13.3  HCT 37.6* 38.3* 42.0 38.8* 39.1  MCV 91.5 91.0 92.3 91.5 89.1  PLT 198 199 195 224 262    LFT Recent Labs  Lab 07/04/23 0425 07/05/23 0437 07/07/23 0410 07/08/23 0635 07/09/23 0554  AST 99* 114* 59* 72* 52*  ALT 54* 51* 38 44 42  ALKPHOS 138* 106 100 101 105  BILITOT 0.8 0.8 0.6 0.8 0.8  PROT 5.2* 5.8* 5.5* 5.4* 5.5*  ALBUMIN 2.1* 2.4* 2.1* 2.0* 2.1*         DVT prophylaxis: Heparin  Code Status: DNR  Family Communication: called wife   CONSULTS  General Surgery IR Palliative care         Joseph Art   [facility] If 7PM-7AM, please contact night-coverage at [email or web address],   07/10/2023, 11:30 AM  LOS: 7 days

## 2023-07-10 NOTE — Plan of Care (Signed)
Patient with continued delirium, aggression, and confusion.  Pulled IV site out, placed bilateral mitts. No compliant with safety alarms and precautions. Tele Sitter Camera placed in room, family and patient educated.   Problem: Health Behavior/Discharge Planning: Goal: Ability to manage health-related needs will improve Outcome: Progressing Goal: Goals will be collaboratively established with patient/family Outcome: Progressing   Problem: Self-Care: Goal: Ability to participate in self-care as condition permits will improve Outcome: Progressing Goal: Verbalization of feelings and concerns over difficulty with self-care will improve Outcome: Progressing Goal: Ability to communicate needs accurately will improve Outcome: Progressing

## 2023-07-11 DIAGNOSIS — K819 Cholecystitis, unspecified: Secondary | ICD-10-CM

## 2023-07-11 LAB — GLUCOSE, CAPILLARY
Glucose-Capillary: 177 mg/dL — ABNORMAL HIGH (ref 70–99)
Glucose-Capillary: 262 mg/dL — ABNORMAL HIGH (ref 70–99)
Glucose-Capillary: 173 mg/dL — ABNORMAL HIGH (ref 70–99)
Glucose-Capillary: 242 mg/dL — ABNORMAL HIGH (ref 70–99)

## 2023-07-11 NOTE — Progress Notes (Addendum)
[facility]  PROGRESS NOTE  Hunter Sparks:[id number] DOB: [birth year] DOA: 07/02/2023 PCP: Benita Stabile, MD   Brief HPI:   Recently discharged from the hospital will on June 23, 2023 after spending 2 days in the hospital for an acute stroke. 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 He has had left hemiparesis since then. D/c'd to CIR Patient was at rehab where he complained of severe abdominal pain for 2 days.  WBC was elevated 22.5.  He was sent to the hospital for further workup.  Right upper quadrant ultrasound showed acute cholecystitis.  General surgery was consulted and recommended IR place drain.  Drain placed on 8/20.  Hospital stay complicated by delirium and poor PO intake-- may need cortrack.  Slow to improve.  Plan back to CIR when able.    Assessment/Plan:  Acute cholecystitis - cholecystostomy drain per IR: Percutaneous cholecystostomy drain to remain in place at least 6 weeks. Recommend fluoroscopy with injection of the drain in IR to evaluate for patency of the cystic duct. IR will follow up with the patient in approximately 6 weeks. A scheduler from their office will call the patient with a date/time of his appointment.  -High risk for surgery given being on Plavix and recent stroke -Antibiotics changed based on culture: Klebsiella.  Noted to be on ceftriaxone since 8/21.  Will plan for a 10-day course. -Will need outpatient follow-up with general surgery to discuss timing of cholecystectomy if GOC do not change There was also concern for ileus based on imaging studies.  However this appears to have improved.  Abdomen is benign.  Diet was advanced.  Continue to monitor.  Sepsis due to acute cholecystitis -Presented with sepsis physiology due to acute cholecystitis, -Initial lactic acid 2.9; improved to  1.2 -sepsis picture resolved  Hemiparesis affecting left side as effect of CVA -Recent CVA causing left hemiparesis -? back to the inpatient rehab once medically stable -Continue Plavix, aspirin --hold statin for now per family request  Diabetes mellitus type 2 -SSI --hold insulin due to low BS from poor PO intake  CAD -Continue aspirin, Lipitor, Lopressor  Hypothyroidism -Continue Synthroid  AKI -appears pre-renal -improving  Delirium -precautions -removed mittens  Hypokalemia Recheck labs tomorrow.  Poor PO intake -may need cortrack if continues to eat poorly and goals continue to be full treatment Family does not want to meet with Palliative care- feel they can make decision on own Patient seems motivated to eat and drink today.  Will see how he does over the next 24 to 48 hours.  Subjective: Wants to get out of bed.  Denies any abdominal pain.  No nausea vomiting.  Objective:  Vitals:   07/10/23 2304 07/11/23 0326 07/11/23 0400 07/11/23 0747  BP: (!) 109/54 (!) 165/78 (!) 150/65 (!) 157/65  Pulse: 82 95  (!) 112  Resp: 10 20  20   Temp: 98.2 F (36.8 C) 98.3 F (36.8 C)  99.4 F (37.4 C)  TempSrc: Axillary Oral  Oral  SpO2: 94% 91%  94%  Weight:        General appearance: Awake alert.  In no distress Resp: Clear to auscultation bilaterally.  Normal effort Cardio: S1-S2 is normal regular.  No S3-S4.  No rubs murmurs or bruit GI: Abdomen is soft.  Nontender nondistended.  Bowel sounds are present normal.  No masses organomegaly.  Cholecystostomy drain is noted. Extremities: No edema.  Full range of motion of lower extremities. Neurologic: Oriented to place year month person.  No obvious focal neurological deficits.     Basic Metabolic Panel: Recent Labs  Lab 07/06/23 0746 07/07/23 0410 07/08/23 0635 07/09/23 0554 07/10/23 0635  NA 135 136 138 137 135  K 3.9 4.1 4.0 3.8 3.4*  CL 101 97* 101 98 97*  CO2 27 27 28 28 26   GLUCOSE 155* 141* 116*  130* 141*  BUN 44* 32* 29* 25* 17  CREATININE 1.74* 1.53* 1.46* 1.47* 1.33*  CALCIUM 8.2* 8.3* 8.3* 8.4* 8.3*    CBC: Recent Labs  Lab 07/06/23 0746 07/07/23 0410 07/08/23 0409 07/09/23 0554 07/10/23 0635  WBC 16.5* 15.9* 15.8* 14.8* 16.1*  HGB 12.5* 12.9* 13.9 12.9* 13.3  HCT 37.6* 38.3* 42.0 38.8* 39.1  MCV 91.5 91.0 92.3 91.5 89.1  PLT 198 199 195 224 262    LFT Recent Labs  Lab 07/05/23 0437 07/07/23 0410 07/08/23 0635 07/09/23 0554  AST 114* 59* 72* 52*  ALT 51* 38 44 42  ALKPHOS 106 100 101 105  BILITOT 0.8 0.6 0.8 0.8  PROT 5.8* 5.5* 5.4* 5.5*  ALBUMIN 2.4* 2.1* 2.0* 2.1*         DVT prophylaxis: Heparin  Code Status: DNR  Family Communication: No family at bedside   CONSULTS  General Surgery IR Palliative care    Shelvia Fojtik   [facility] If 7PM-7AM, please contact night-coverage at [email or web address],   07/11/2023, 10:25 AM  LOS: 8 days

## 2023-07-11 NOTE — Progress Notes (Signed)
Occupational Therapy Treatment Patient Details Name: Hunter Sparks MRN: [id number] DOB: 1945/12/16 Today's Date: 07/11/2023   History of present illness Pt is 77 yo male who presents on 07/02/23 from CIR with acute abdominal pain, underwent drain placement on 07/03/23, to have cholecystectomy soon. 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  Patient session focus on pre-gait training in hallway with PT (refer to PT note) and functional mobility to work on ADL tolerance. Patient remains limited with regard to lower body ADLs and toileting, but improved command following and static balance in stedy and when using rail in hallway to address functional deficits. Patient remains at a mod-max A level for ADL management, and mod of 2 for transfers. OT continues to highly recommend intensive therapy services. OT will continue to follow.      If plan is discharge home, recommend the following:  A lot of help with walking and/or transfers;Help with stairs or ramp for entrance;Assist for transportation;A lot of help with bathing/dressing/bathroom;Assistance with cooking/housework;Direct supervision/assist for financial management;Supervision due to cognitive status;Direct supervision/assist for medications management   Equipment Recommendations  Other (comment) (defer to next venue)    Recommendations for Other Services      Precautions / Restrictions Precautions Precautions: Fall;Other (comment) Precaution Comments: left mild hemiparesis Restrictions Weight Bearing Restrictions: No Other Position/Activity Restrictions: Rt sided percutaneous drain       Mobility Bed Mobility Overal bed mobility: Needs  Assistance Bed Mobility: Sit to Supine       Sit to supine: Mod assist, +2 for safety/equipment, Used rails   General bed mobility comments: Mod-max cues for use of bed rail with Lt UE and positioning Rt UE for moving to Rt side and Mod-Max assist for bringing LE's onto bed. pt able to control lowering trunk and rolling to back to return to supine. Bed positioned in trendelenburg and pt able to use bil UE on headboard and use bil LE's to scoot superiorly and reposition in bed.    Transfers Overall transfer level: Needs assistance Equipment used: 1 person hand held assist, Ambulation equipment used (Rt hallway rail) Transfers: Sit to/from Stand, Bed to chair/wheelchair/BSC Sit to Stand: +2 safety/equipment, Max assist           General transfer comment: Pt positioned by Rt hallway rail and Max assist to block Lt LE and facilitate knee/hip extension provided. Pt completed 2x, cues to activate hip extensors improved pt's posture and to lean towards wall/rail improved weight shift Rt for more midline posture. pt fatigued and Stedy utilized for chair>bed transfer with Mod+2 to rise to stand and pt using bil UE on crossbar. Transfer via Lift Equipment: Stedy   Balance Overall balance assessment: Needs assistance Sitting-balance support: Feet supported, No upper extremity supported Sitting balance-Leahy Scale: Poor Sitting balance - Comments: L lean, min A in sitting Postural control: Left lateral lean Standing balance support: During functional activity, Bilateral upper extremity supported, Reliant on assistive device for balance Standing balance-Leahy Scale: Poor Standing balance comment: max A on L side in standing                           ADL either performed or assessed with clinical  judgement   ADL Overall ADL's : Needs assistance/impaired     Grooming: Wash/dry hands;Wash/dry face;Set up;Sitting           Upper Body Dressing : Minimal assistance;Sitting        Toilet Transfer: Moderate assistance;+2 for physical assistance;+2 for safety/equipment;BSC/3in1 Toilet Transfer Details (indicate cue type and reason): simulated with stedy Toileting- Clothing Manipulation and Hygiene: Total assistance;Sit to/from stand;Sitting/lateral lean Toileting - Clothing Manipulation Details (indicate cue type and reason): patient unable to complete peri care when standing in stedy       General ADL Comments: Patient session focus on pre-gait training in hallway with PT (refer to PT note) and functional mobility to work on ADL tolerance. Patient remains limited with regard to lower body ADLs and toileting, but improved command following and static balance in stedy and when using rail in hallway to address functional deficits. Patient remains at a mod-max A level for ADL management, and mod of 2 for transfers. OT continues to highly recommend intensive therapy services. OT will continue to follow.    Extremity/Trunk Assessment              Vision       Perception     Praxis      Cognition Arousal: Alert Behavior During Therapy: WFL for tasks assessed/performed Overall Cognitive Status: Impaired/Different from baseline                     Current Attention Level: Sustained, Selective Memory: Decreased short-term memory Following Commands: Follows one step commands with increased time, Follows one step commands consistently, Follows multi-step commands inconsistently Safety/Judgement: Decreased awareness of safety, Decreased awareness of deficits Awareness: Intellectual Problem Solving: Slow processing, Decreased initiation, Difficulty sequencing, Requires verbal cues, Requires tactile cues          Exercises      Shoulder Instructions       General Comments VSS    Pertinent Vitals/ Pain       Pain Assessment Pain Assessment: No/denies pain Pain Intervention(s): Limited activity within patient's tolerance, Monitored during session,  Repositioned  Home Living                                          Prior Functioning/Environment              Frequency  Min 1X/week        Progress Toward Goals  OT Goals(current goals can now be found in the care plan section)  Progress towards OT goals: Progressing toward goals  Acute Rehab OT Goals Patient Stated Goal: to get better OT Goal Formulation: With patient/family Time For Goal Achievement: 07/19/23 Potential to Achieve Goals: Good  Plan      Co-evaluation      Reason for Co-Treatment: To address functional/ADL transfers;For patient/therapist safety PT goals addressed during session: Mobility/safety with mobility;Balance OT goals addressed during session: ADL's and self-care      AM-PAC OT "6 Clicks" Daily Activity     Outcome Measure   Help from another person eating meals?: A Little Help from another person taking care of personal grooming?: A Little Help from another person toileting, which includes using toliet, bedpan, or urinal?: Total Help from another person bathing (including washing, rinsing, drying)?: A Lot Help from another person to put on and taking off regular upper body clothing?: A Lot Help from another  person to put on and taking off regular lower body clothing?: Total 6 Click Score: 12    End of Session Equipment Utilized During Treatment: Gait belt;Other (comment) Antony Salmon)  OT Visit Diagnosis: Unsteadiness on feet (R26.81);Other abnormalities of gait and mobility (R26.89);Muscle weakness (generalized) (M62.81);Hemiplegia and hemiparesis;Other symptoms and signs involving the nervous system (R29.898);Feeding difficulties (R63.3);Pain;Other symptoms and signs involving cognitive function Hemiplegia - Right/Left: Left Hemiplegia - dominant/non-dominant: Non-Dominant Hemiplegia - caused by: Cerebral infarction   Activity Tolerance Patient tolerated treatment well   Patient Left in bed;with call bell/phone  within reach;with bed alarm set   Nurse Communication Mobility status        Time: 5366-4403 OT Time Calculation (min): 32 min  Charges: OT General Charges $OT Visit: 1 Visit OT Treatments $Self Care/Home Management : 8-22 mins  Pollyann Glen E. Chakia Counts, OTR/L Acute Rehabilitation Services [phone]   Cherlyn Cushing 07/11/2023, 4:42 PM

## 2023-07-11 NOTE — Progress Notes (Signed)
Referring Physician(s): [name]  Supervising Physician: Oley Balm  [facility]:  [facility]  Chief Complaint:  Acute cholecystitis s/p percutaneous cholecystostomy 07/03/23  Subjective:  Patient sitting in chair at side of bed at time of exam. He denies abdominal pain at this time.   Allergies: Penicillins  Medications: Prior to Admission medications   Medication Sig Start Date End Date Taking? Authorizing Provider  acetaminophen (TYLENOL) 325 MG tablet Take 2 tablets (650 mg total) by mouth every 4 (four) hours as needed for mild pain (or temp > 37.5 C (99.5 F)). 06/23/23   Shon Hale, MD  aspirin EC 81 MG tablet Take 1 tablet (81 mg total) by mouth daily with breakfast. -Take Aspirin 81 mg daily along with Plavix 75 mg daily for 21 days then after that STOP the Plavix  and continue ONLY Aspirin 81 mg daily indefinitely-- 06/23/23   Shon Hale, MD  atorvastatin (LIPITOR) 40 MG tablet Take 1 tablet (40 mg total) by mouth daily. 06/24/23   Shon Hale, MD  blood glucose meter kit and supplies 1 each by Other route 4 (four) times daily. Dispense based on patient and insurance preference. Use up to four times daily as directed. (FOR ICD-10 E10.9, E11.9). 01/16/20   Roma Kayser, MD  clopidogrel (PLAVIX) 75 MG tablet Take 1 tablet (75 mg total) by mouth daily. Take Aspirin 81 mg daily along with Plavix 75 mg daily for 21 days then after that STOP the Plavix  and continue ONLY Aspirin 81 mg daily indefinitely-- 06/24/23   Shon Hale, MD  glucose blood (ONETOUCH VERIO) test strip 1 each by Other route 4 (four) times daily. Use as instructed 10/27/19   Roma Kayser, MD  insulin aspart (NOVOLOG) 100 UNIT/ML FlexPen Inject 0-10 Units into the skin 3 (three) times daily with meals. Short acting insulin per sliding scale 0-10 ---:-- Insulin injection 0-10 Units 0-10 Units Subcutaneous, 3 times daily with meals CBG 70 - 120: 0 unit CBG 121 -  150: 0 unit  CBG 151 - 200: 1 unit CBG 201 - 250: 2 units CBG 251 - 300: 4 units CBG 301 - 350: 6 units  CBG 351 - 400: 8 units  CBG > 400: 10 units 06/23/23   Emokpae, Courage, MD  insulin glargine, 2 Unit Dial, (TOUJEO MAX SOLOSTAR) 300 UNIT/ML Solostar Pen Inject 42 Units into the skin at bedtime. 06/23/23   Emokpae, Courage, MD  Insulin Pen Needle (B-D ULTRAFINE III SHORT PEN) 31G X 8 MM MISC 1 each by Does not apply route as directed. 10/27/19   Roma Kayser, MD  levothyroxine (SYNTHROID) 75 MCG tablet Take 1 tablet (75 mcg total) by mouth daily before breakfast. 06/23/23   Shon Hale, MD  losartan (COZAAR) 25 MG tablet Take 1 tablet (25 mg total) by mouth daily. 06/19/23   Antoine Poche, MD  metoprolol tartrate (LOPRESSOR) 25 MG tablet Take 1 tablet (25 mg total) by mouth 2 (two) times daily. 06/19/23   Antoine Poche, MD  senna-docusate (SENOKOT-S) 8.6-50 MG tablet Take 2 tablets by mouth at bedtime. 06/23/23   Shon Hale, MD  traMADol (ULTRAM) 50 MG tablet Take 1 tablet (50 mg total) by mouth every 6 (six) hours as needed. pain 06/19/15   Barrett, Erin R, PA-C  traZODone (DESYREL) 100 MG tablet Take 1 tablet (100 mg total) by mouth at bedtime. 06/23/23   Shon Hale, MD     Vital Signs: BP 130/67 (BP  Location: Right Arm)   Pulse 80   Temp 98 F (36.7 C) (Oral)   Resp 16   Wt 206 lb 2.1 oz (93.5 kg)   SpO2 94%   BMI 27.96 kg/m   Physical Exam Vitals reviewed.  Pulmonary:     Effort: Pulmonary effort is normal.  Abdominal:     Comments: RUQ drain in place. ~100 mL of bilious output in bag at time of exam. Drain insertion site with small amount of dried blood. No other drainage noted. Suture and stat lock intact.  Skin:    General: Skin is warm and dry.  Neurological:     Mental Status: He is alert.     Comments: Patient oriented x2     Imaging: DG Abd Portable 1V  Result Date: 07/09/2023 CLINICAL DATA:  Abdominal distension EXAM: PORTABLE ABDOMEN  - 1 VIEW COMPARISON:  06/29/2023 FINDINGS: 2 supine frontal views of the abdomen and pelvis are obtained. Percutaneous cholecystostomy tube overlies the right upper quadrant. There is mild diffuse gaseous distension of the large and small bowel, favor ileus. No evidence of high-grade obstruction. No masses or abnormal calcifications. Bibasilar patchy consolidation could reflect hypoventilatory changes or airspace disease. Improved aeration since prior exam. IMPRESSION: 1. Mild gaseous distension of the large and small bowel, most consistent with ileus. No evidence of high-grade bowel obstruction. 2. Percutaneous cholecystostomy tube right upper quadrant. 3. Persistent but improving bibasilar patchy consolidation, which could reflect improving airspace disease or atelectasis. Electronically Signed   By: Sharlet Salina M.D.   On: 07/09/2023 18:03    Labs:  CBC: Recent Labs    07/07/23 0410 07/08/23 0409 07/09/23 0554 07/10/23 0635  WBC 15.9* 15.8* 14.8* 16.1*  HGB 12.9* 13.9 12.9* 13.3  HCT 38.3* 42.0 38.8* 39.1  PLT 199 195 224 262    COAGS: Recent Labs    06/21/23 0041 07/03/23 1116  INR 0.9 1.3*  APTT 26  --     BMP: Recent Labs    07/07/23 0410 07/08/23 0635 07/09/23 0554 07/10/23 0635  NA 136 138 137 135  K 4.1 4.0 3.8 3.4*  CL 97* 101 98 97*  CO2 27 28 28 26   GLUCOSE 141* 116* 130* 141*  BUN 32* 29* 25* 17  CALCIUM 8.3* 8.3* 8.4* 8.3*  CREATININE 1.53* 1.46* 1.47* 1.33*  GFRNONAA 46* 49* 49* 55*    LIVER FUNCTION TESTS: Recent Labs    07/05/23 0437 07/07/23 0410 07/08/23 0635 07/09/23 0554  BILITOT 0.8 0.6 0.8 0.8  AST 114* 59* 72* 52*  ALT 51* 38 44 42  ALKPHOS 106 100 101 105  PROT 5.8* 5.5* 5.4* 5.5*  ALBUMIN 2.4* 2.1* 2.0* 2.1*    Assessment and Plan:  Acute cholecystitis s/p percutaneous cholecystostomy 07/03/23  Drain Location: RUQ Size: Fr size: 10 Fr Date of placement: 07/03/23  Currently to: Drain collection device: gravity 24 hour output:   Output by Drain (mL) 07/09/23 0701 - 07/09/23 1900 07/09/23 1901 - 07/10/23 0700 07/10/23 0701 - 07/10/23 1900 07/10/23 1901 - 07/11/23 0700 07/11/23 0701 - 07/11/23 1554  Biliary Tube Cook slip-coat 10.2 Fr. RUQ 50 130 90 200     Interval imaging/drain manipulation:  None  Current examination: Flushes/aspirates easily.  Insertion site unremarkable. Suture and stat lock in place. Dressed appropriately.   Discharge planning: Percutaneous cholecystostomy drain to remain in place at least 6 weeks. Recommend fluoroscopy with injection of the drain in IR to evaluate for patency of the cystic duct. IR will follow  up with the patient in approximately 6 weeks. A scheduler from our office will call the patient with a date/time of his appointment.  If the duct is patent and general surgery feels patient is stable for cholecystectomy, the drain would be removed at time of surgery. If the duct is patent and general surgery feels patient is NEVER a candidate for cholecystectomy, drain can be capped for a trial. If symptoms recur, then place to gravity bag again. If trial is successful, discuss possible removal of the drain.   IR will continue to follow - please call with questions or concerns.  Electronically Signed: Kennieth Francois, PA-C 07/11/2023, 3:36 PM   I spent a total of 15 Minutes at the the patient's bedside AND on the patient's hospital floor or unit, greater than 50% of which was counseling/coordinating care for percutaneous cholecystostomy.

## 2023-07-11 NOTE — Progress Notes (Signed)
Physical Therapy Treatment Patient Details Name: Hunter Sparks MRN: [id number] DOB: 13-Apr-1946 Today's Date: 07/11/2023   History of Present Illness Pt is 77 yo male who presents on 07/02/23 from CIR with acute abdominal pain, underwent drain placement on 07/03/23, to have cholecystectomy soon. 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 Pt dependently transported in recliner to hallway for transfer training with Rt UE support on hand rail. Max +1 assist for sit<>stand with manual blocking and facilitation of Lt knee/hip extension to rise. Cues to activate gluteals improved posture. Max assist for lateral weight shift provided for pt to take small steps backwards toward recliner with Rt UE on rail and therapist on Lt side providing max support. Pt then dependently transported back to room and sit<>stand completed with [facility] for chair>bed transfer. Pt performed sustained standing with support of stedy and OT for pericare. Stedy transfer to EOB and Mod-Max +2 assist for return to supine and pt cues for ues of bed features to reposition self in supine. Continue to recommend intense follow up >3 hours/day. Will progress as able.    If plan is discharge home, recommend the following: Help with stairs or ramp for entrance;Assistance with cooking/housework;Two people to help with walking and/or transfers;Supervision due to cognitive status;Assist for transportation;Direct supervision/assist for financial management;Assistance with feeding;Direct supervision/assist for medications management;Two people to help with bathing/dressing/bathroom   Can travel by private vehicle        Equipment Recommendations  Other (comment)  (defer to next venue)    Recommendations for Other Services Rehab consult     Precautions / Restrictions Precautions Precautions: Fall;Other (comment) Precaution Comments: left mild hemiparesis Restrictions Weight Bearing Restrictions: No Other Position/Activity Restrictions: Rt sided percutaneous drain     Mobility  Bed Mobility Overal bed mobility: Needs Assistance Bed Mobility: Sit to Supine       Sit to supine: Mod assist, +2 for safety/equipment, Used rails   General bed mobility comments: Mod-max cues for use of bed rail with Lt UE and positioning Rt UE for moving to Rt side and Mod-Max assist for bringing LE's onto bed. pt able to control lowering trunk and rolling to back to return to supine. Bed positioned in trendelenburg and pt able to use bil UE on headboard and use bil LE's to scoot superiorly and reposition in bed.    Transfers Overall transfer level: Needs assistance Equipment used: 1 person hand held assist, Ambulation equipment used (Rt hallway rail) Transfers: Sit to/from Stand, Bed to chair/wheelchair/BSC Sit to Stand: +2 safety/equipment, Max assist           General transfer comment: Pt positioned by Rt hallway rail and Max assist to block Lt LE and facilitate knee/hip extension provided. Pt completed 2x, cues to activate hip extensors improved pt's posture and to lean towards wall/rail improved weight shift Rt for more midline posture. pt fatigued and Stedy utilized for chair>bed transfer with Mod+2 to rise to stand and pt using bil UE on crossbar. Transfer via Lift Equipment: Producer, television/film/video  Tilt Bed    Modified Rankin (Stroke Patients Only)       Balance Overall balance assessment: Needs assistance Sitting-balance support: Feet supported, No upper extremity supported Sitting balance-Leahy Scale: Poor Sitting balance - Comments: L lean, min A in  sitting Postural control: Left lateral lean Standing balance support: During functional activity, Bilateral upper extremity supported, Reliant on assistive device for balance Standing balance-Leahy Scale: Poor Standing balance comment: max A on L side in standing                            Cognition Arousal: Alert Behavior During Therapy: WFL for tasks assessed/performed Overall Cognitive Status: Impaired/Different from baseline                     Current Attention Level: Sustained, Selective Memory: Decreased short-term memory Following Commands: Follows one step commands with increased time, Follows one step commands consistently, Follows multi-step commands inconsistently Safety/Judgement: Decreased awareness of safety, Decreased awareness of deficits Awareness: Intellectual Problem Solving: Slow processing, Decreased initiation, Difficulty sequencing, Requires verbal cues, Requires tactile cues          Exercises      General Comments        Pertinent Vitals/Pain Pain Assessment Pain Assessment: No/denies pain Pain Intervention(s): Monitored during session, Repositioned    Home Living                          Prior Function            PT Goals (current goals can now be found in the care plan section) Acute Rehab PT Goals PT Goal Formulation: With patient/family Time For Goal Achievement: 07/19/23 Potential to Achieve Goals: Good Progress towards PT goals: Progressing toward goals    Frequency    Min 1X/week      PT Plan      Co-evaluation PT/OT/SLP Co-Evaluation/Treatment: Yes Reason for Co-Treatment: To address functional/ADL transfers;For patient/therapist safety PT goals addressed during session: Mobility/safety with mobility;Balance OT goals addressed during session: ADL's and self-care      AM-PAC PT "6 Clicks" Mobility   Outcome Measure  Help needed turning from your back to your side while in a flat bed  without using bedrails?: A Lot Help needed moving from lying on your back to sitting on the side of a flat bed without using bedrails?: A Lot Help needed moving to and from a bed to a chair (including a wheelchair)?: Total Help needed standing up from a chair using your arms (e.g., wheelchair or bedside chair)?: Total Help needed to walk in hospital room?: Total Help needed climbing 3-5 steps with a railing? : Total 6 Click Score: 8    End of Session Equipment Utilized During Treatment: Gait belt Activity Tolerance: Patient tolerated treatment well Patient left: in bed;with call bell/phone within reach;with bed alarm set Nurse Communication: Mobility status PT Visit Diagnosis: Unsteadiness on feet (R26.81);Other abnormalities of gait and mobility (R26.89);Muscle weakness (generalized) (M62.81);Hemiplegia and hemiparesis Hemiplegia - Right/Left: Left Hemiplegia - dominant/non-dominant: Non-dominant Hemiplegia - caused by: Cerebral infarction     Time: 4782-9562 PT Time Calculation (min) (ACUTE ONLY): 31 min  Charges:    $Therapeutic Activity: 8-22 mins PT General Charges $$ ACUTE PT VISIT: 1 Visit                     Wynn Maudlin, DPT Acute Rehabilitation Services Office  [phone]  07/11/23 4:34 PM

## 2023-07-11 NOTE — Progress Notes (Signed)
  Inpatient Rehabilitation Admissions Coordinator   I have received insurance approval for CIR admit after peer to peer with Dr Shearon Stalls and Dr Logan Bores. I contacted pt's wife by phone and she is in agreement. I am hopeful for a bed in the next 24 to 48 hrs.  Ottie Glazier, RN, MSN Rehab Admissions Coordinator [phone] 07/11/2023 1:33 PM

## 2023-07-12 LAB — GLUCOSE, CAPILLARY
Glucose-Capillary: 150 mg/dL — ABNORMAL HIGH (ref 70–99)
Glucose-Capillary: 220 mg/dL — ABNORMAL HIGH (ref 70–99)
Glucose-Capillary: 185 mg/dL — ABNORMAL HIGH (ref 70–99)
Glucose-Capillary: 141 mg/dL — ABNORMAL HIGH (ref 70–99)

## 2023-07-12 LAB — BASIC METABOLIC PANEL
Anion gap: 10 (ref 5–15)
BUN: 19 mg/dL (ref 8–23)
CO2: 25 mmol/L (ref 22–32)
Calcium: 8.1 mg/dL — ABNORMAL LOW (ref 8.9–10.3)
Chloride: 101 mmol/L (ref 98–111)
Creatinine, Ser: 1.51 mg/dL — ABNORMAL HIGH (ref 0.61–1.24)
GFR, Estimated: 47 mL/min — ABNORMAL LOW (ref >60)
Glucose, Bld: 144 mg/dL — ABNORMAL HIGH (ref 70–99)
Potassium: 4 mmol/L (ref 3.5–5.1)
Sodium: 136 mmol/L (ref 135–145)

## 2023-07-12 LAB — MAGNESIUM: Magnesium: 1.7 mg/dL (ref 1.7–2.4)

## 2023-07-12 LAB — CBC
HCT: 33.8 % — ABNORMAL LOW (ref 39.0–52.0)
Hemoglobin: 11.5 g/dL — ABNORMAL LOW (ref 13.0–17.0)
MCH: 30.8 pg (ref 26.0–34.0)
MCHC: 34 g/dL (ref 30.0–36.0)
MCV: 90.6 fL (ref 80.0–100.0)
Platelets: 258 10*3/uL (ref 150–400)
RBC: 3.73 MIL/uL — ABNORMAL LOW (ref 4.22–5.81)
RDW: 13 % (ref 11.5–15.5)
WBC: 12.4 10*3/uL — ABNORMAL HIGH (ref 4.0–10.5)
nRBC: 0 % (ref 0.0–0.2)

## 2023-07-12 LAB — SUSCEPTIBILITY RESULT

## 2023-07-12 LAB — SUSCEPTIBILITY, AER + ANAEROB

## 2023-07-12 MED ORDER — LEVOFLOXACIN 250 MG PO TABS
750.0000 mg | ORAL_TABLET | Freq: Once | ORAL | Status: DC
  Filled 2023-07-12: qty 3

## 2023-07-12 MED ORDER — LINEZOLID 600 MG PO TABS
600.0000 mg | ORAL_TABLET | Freq: Two times a day (BID) | ORAL | Status: DC
  Administered 2023-07-12: 600 mg via ORAL
  Filled 2023-07-12: qty 1

## 2023-07-12 MED ORDER — LEVOFLOXACIN 250 MG PO TABS
250.0000 mg | ORAL_TABLET | Freq: Once | ORAL | Status: AC
  Administered 2023-07-12: 250 mg via ORAL
  Filled 2023-07-12: qty 1

## 2023-07-12 MED ORDER — LEVOFLOXACIN 250 MG PO TABS
250.0000 mg | ORAL_TABLET | ORAL | Status: DC
  Administered 2023-07-13 – 2023-07-14 (×2): 250 mg via ORAL
  Filled 2023-07-12 (×3): qty 1

## 2023-07-12 NOTE — Progress Notes (Signed)
Inpatient Rehabilitation Admissions Coordinator   I met at bedside with patient and his wife. He is up in chair, alert and talkative. Looks great. I await bed availability to admit to CIR.  Ottie Glazier, RN, MSN Rehab Admissions Coordinator [phone] 07/12/2023 10:39 AM

## 2023-07-12 NOTE — Progress Notes (Signed)
Physical Therapy Treatment Patient Details Name: Hunter Sparks MRN: [id number] DOB: 1945-12-09 Today's Date: 07/12/2023   History of Present Illness Pt is 77 yo male who presents on 07/02/23 from CIR with acute abdominal pain, underwent drain placement on 07/03/23, to have cholecystectomy soon. 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 Worked on sit>stand from recliner to RW, needed max A +2, esp on L side. After several trials pt was more fatigued and could not progress to ambulation. Practiced SPT from chair to bed to the R without AD and pt able to achieve with max A +1 to get hips over arm of chair. Pt's wife reports that he has been more lethargic today than past few days. PT will continue to follow.     If plan is discharge home, recommend the following: Help with stairs or ramp for entrance;Assistance with cooking/housework;Two people to help with walking and/or transfers;Supervision due to cognitive status;Assist for transportation;Direct supervision/assist for financial management;Assistance with feeding;Direct supervision/assist for medications management;Two people to help with bathing/dressing/bathroom   Can travel by private vehicle        Equipment Recommendations  Other (comment) (defer to next venue)    Recommendations for Other Services Rehab consult     Precautions / Restrictions Precautions Precautions: Fall;Other (comment) Precaution Comments: left hemiparesis Restrictions Weight Bearing Restrictions: No Other Position/Activity Restrictions: Rt sided percutaneous drain     Mobility  Bed Mobility Overal bed mobility: Needs Assistance Bed Mobility: Sit to Supine       Sit to supine: Mod assist   General bed  mobility comments: when given extra time pt able to go down to R elbow and initiate LLE into bed but needed mod A to get it in fully. Was able to bring RLE into bed and scoot to L with min A.    Transfers Overall transfer level: Needs assistance Equipment used: Rolling walker (2 wheels) Transfers: Sit to/from Stand, Bed to chair/wheelchair/BSC Sit to Stand: +2 safety/equipment, Max assist Stand pivot transfers: Max assist, +2 physical assistance         General transfer comment: Worked on mutliple sit>stands with RW then bed to chair transfers to R without AD.  Pt needed max A to get hips over arm of chair. Was able to use RUE to pull on bed rail to assist.    Ambulation/Gait         Gait velocity: slow     General Gait Details: to fatigued and lethargic to progress ambulation today   Stairs             Wheelchair Mobility     Tilt Bed    Modified Rankin (Stroke Patients Only) Modified Rankin (Stroke Patients Only) Pre-Morbid Rankin Score: No symptoms Modified Rankin: Severe disability     Balance Overall balance assessment: Needs assistance Sitting-balance support: Feet supported, No upper extremity supported Sitting balance-Leahy Scale: Poor Sitting balance - Comments: L lean, worked on repeatedly coming to midline as well as leaning onto R elbow. With repetition progressed from needing min A in sitting to supervision. Doing well following vc's Postural control: Left lateral lean Standing balance support: During functional activity, Bilateral upper extremity supported, Reliant on assistive device for balance Standing balance-Leahy Scale: Poor Standing balance comment:  max A on L side in standing, L knee buckling                            Cognition Arousal: Lethargic Behavior During Therapy: Flat affect Overall Cognitive Status: Impaired/Different from baseline Area of Impairment: Orientation, Attention, Memory, Following commands,  Safety/judgement, Awareness, Problem solving                 Orientation Level: Disoriented to, Time, Situation Current Attention Level: Sustained, Selective Memory: Decreased short-term memory Following Commands: Follows one step commands with increased time, Follows one step commands consistently, Follows multi-step commands inconsistently Safety/Judgement: Decreased awareness of safety, Decreased awareness of deficits Awareness: Intellectual Problem Solving: Slow processing, Decreased initiation, Difficulty sequencing, Requires verbal cues, Requires tactile cues General Comments: confusion getting better but quite lethargic today        Exercises      General Comments General comments (skin integrity, edema, etc.): VSS      Pertinent Vitals/Pain Pain Assessment Pain Assessment: No/denies pain    Home Living                          Prior Function            PT Goals (current goals can now be found in the care plan section) Acute Rehab PT Goals Patient Stated Goal: go back to rehab PT Goal Formulation: With patient/family Time For Goal Achievement: 07/19/23 Potential to Achieve Goals: Good Progress towards PT goals: Progressing toward goals    Frequency    Min 1X/week      PT Plan      Co-evaluation              AM-PAC PT "6 Clicks" Mobility   Outcome Measure  Help needed turning from your back to your side while in a flat bed without using bedrails?: A Lot Help needed moving from lying on your back to sitting on the side of a flat bed without using bedrails?: A Lot Help needed moving to and from a bed to a chair (including a wheelchair)?: Total Help needed standing up from a chair using your arms (e.g., wheelchair or bedside chair)?: Total Help needed to walk in hospital room?: Total Help needed climbing 3-5 steps with a railing? : Total 6 Click Score: 8    End of Session Equipment Utilized During Treatment: Gait belt Activity  Tolerance: Patient tolerated treatment well Patient left: in bed;with call bell/phone within reach;with bed alarm set;with family/visitor present Nurse Communication: Mobility status PT Visit Diagnosis: Unsteadiness on feet (R26.81);Other abnormalities of gait and mobility (R26.89);Muscle weakness (generalized) (M62.81);Hemiplegia and hemiparesis Hemiplegia - Right/Left: Left Hemiplegia - dominant/non-dominant: Non-dominant Hemiplegia - caused by: Cerebral infarction     Time: 1238-1302 PT Time Calculation (min) (ACUTE ONLY): 24 min  Charges:    $Therapeutic Activity: 23-37 mins PT General Charges $$ ACUTE PT VISIT: 1 Visit                     Lyanne Co, PT  Acute Rehab Services Secure chat preferred Office [phone]    Lawana Chambers Denisia Harpole 07/12/2023, 2:05 PM

## 2023-07-12 NOTE — Care Management Important Message (Signed)
Important Message  Patient Details  Name: Hunter Sparks MRN: [id number] Date of Birth: June 10, 1946   Medicare Important Message Given:  Yes     Sherilyn Banker 07/12/2023, 12:45 PM

## 2023-07-12 NOTE — Progress Notes (Signed)
[facility]  PROGRESS NOTE  Hunter Sparks ZOX:[id number] DOB: 1946-08-20 DOA: 07/02/2023 PCP: Hunter Stabile, MD   Brief HPI:   Recently discharged from the hospital will on June 23, 2023 after spending 2 days in the hospital for an acute stroke. 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 He has had left hemiparesis since then. D/c'd to CIR Patient was at rehab where he complained of severe abdominal pain for 2 days.  WBC was elevated 22.5.  He was sent to the hospital for further workup.  Right upper quadrant ultrasound showed acute cholecystitis.  General surgery was consulted and recommended IR place drain.  Drain placed on 8/20.  Hospital stay complicated by delirium and poor PO intake-- may need cortrack.  Slow to improve.  Plan back to CIR when able.    Assessment/Plan:  Acute cholecystitis Underwent placement of cholecystostomy drain per IR. Percutaneous cholecystostomy drain to remain in place at least 6 weeks. Recommend fluoroscopy with injection of the drain in IR to evaluate for patency of the cystic duct. IR will follow up with the patient in approximately 6 weeks. A scheduler from their office will call the patient with a date/time of his appointment.  Patient to also follow-up with general surgery. -High risk for surgery given being on Plavix and recent stroke -Antibiotics changed based on culture: Klebsiella.  Noted to be on ceftriaxone since 8/21.  Culture is also growing Enterococcus faecalis.  Sensitivities are not available yet.  Will change from ceftriaxone to linezolid.   -Will need outpatient follow-up with general surgery to discuss timing of cholecystectomy if GOC do not change There was also concern for ileus based on imaging studies.  However this appears to have improved.  Abdomen is benign.  Diet was  advanced which he appears to be tolerating well.  Sepsis due to acute cholecystitis -Presented with sepsis physiology due to acute cholecystitis, -Initial lactic acid 2.9; improved to 1.2 -sepsis picture resolved  Hemiparesis affecting left side as effect of CVA -Recent CVA causing left hemiparesis -? back to the inpatient rehab once medically stable -Continue Plavix, aspirin --hold statin for now per family request  Diabetes mellitus type 2 -SSI --hold insulin due to low BS from poor PO intake  CAD -Continue aspirin, Lopressor.  Statin currently on hold per family request.  Should be able to resume at discharge.  Hypothyroidism -Continue Synthroid  AKI -appears pre-renal -improving  Delirium -precautions -removed mittens  Hypokalemia Recheck labs tomorrow.  Poor PO intake -may need cortrack if continues to eat poorly and goals continue to be full treatment Family does not want to meet with Palliative care- feel they can make decision on own Patient seems motivated to eat and drink today.  Will see how he does over the next 24 to 48 hours.  Subjective: No complaints offered.  Feels well.  Denies any abdominal pain nausea or vomiting.  Tolerating his diet.  Objective:  Vitals:   07/11/23 2006 07/11/23 2323 07/12/23 0312 07/12/23 0712  BP: (!) 152/63 (!) 120/56 (!) 139/59 (!) 104/52  Pulse: 93 85 80 85  Resp: 20 14 11 13   Temp: 98.3 F (36.8 C) 98.4 F (36.9 C) 98 F (36.7 C) 97.8 F (36.6 C)  TempSrc: Oral Oral Oral Oral  SpO2: 97% 95% 94% 95%  Weight:  General appearance: Awake alert.  In no distress Resp: Clear to auscultation bilaterally.  Normal effort Cardio: S1-S2 is normal regular.  No S3-S4.  No rubs murmurs or bruit GI: Abdomen is soft.  Nontender nondistended.  Bowel sounds are present normal.  No masses organomegaly.  Cholecystostomy drain tube is noted.     Basic Metabolic Panel: Recent Labs  Lab 07/07/23 0410 07/08/23 0635  07/09/23 0554 07/10/23 0635 07/12/23 0330  NA 136 138 137 135 136  K 4.1 4.0 3.8 3.4* 4.0  CL 97* 101 98 97* 101  CO2 27 28 28 26 25   GLUCOSE 141* 116* 130* 141* 144*  BUN 32* 29* 25* 17 19  CREATININE 1.53* 1.46* 1.47* 1.33* 1.51*  CALCIUM 8.3* 8.3* 8.4* 8.3* 8.1*  MG  --   --   --   --  1.7    CBC: Recent Labs  Lab 07/07/23 0410 07/08/23 0409 07/09/23 0554 07/10/23 0635 07/12/23 0330  WBC 15.9* 15.8* 14.8* 16.1* 12.4*  HGB 12.9* 13.9 12.9* 13.3 11.5*  HCT 38.3* 42.0 38.8* 39.1 33.8*  MCV 91.0 92.3 91.5 89.1 90.6  PLT 199 195 224 262 258    LFT Recent Labs  Lab 07/07/23 0410 07/08/23 0635 07/09/23 0554  AST 59* 72* 52*  ALT 38 44 42  ALKPHOS 100 101 105  BILITOT 0.6 0.8 0.8  PROT 5.5* 5.4* 5.5*  ALBUMIN 2.1* 2.0* 2.1*         DVT prophylaxis: Heparin  Code Status: DNR  Family Communication: No family at bedside  Plan is for transfer back to inpatient rehabilitation when they have a bed available.  CONSULTS  General Surgery IR Palliative care    Hunter Sparks   [facility] If 7PM-7AM, please contact night-coverage at [email or web address],   07/12/2023, 9:52 AM  LOS: 9 days

## 2023-07-13 LAB — GLUCOSE, CAPILLARY
Glucose-Capillary: 147 mg/dL — ABNORMAL HIGH (ref 70–99)
Glucose-Capillary: 182 mg/dL — ABNORMAL HIGH (ref 70–99)
Glucose-Capillary: 156 mg/dL — ABNORMAL HIGH (ref 70–99)
Glucose-Capillary: 192 mg/dL — ABNORMAL HIGH (ref 70–99)
Glucose-Capillary: 169 mg/dL — ABNORMAL HIGH (ref 70–99)

## 2023-07-13 MED ORDER — HALOPERIDOL LACTATE 5 MG/ML IJ SOLN
2.0000 mg | Freq: Once | INTRAMUSCULAR | Status: AC
  Administered 2023-07-13: 2 mg via INTRAVENOUS
  Filled 2023-07-13: qty 1

## 2023-07-13 NOTE — Progress Notes (Signed)
At 1129, pt's wife at bedside ran out of the room and notified staff that pt was getting out of bed. Per pt's wife, "something in his mind snapped". Pt alert and oriented to self. Pt wanting to go home to see his dog while yelling at wife to give him his keys to the truck. Pt was agitated and raising arms/legs against staff. Staff assisted pt back into bed. MD notified. Non-violent restraints were started and IV haldol was given once. Pt is currently resting with the bed at the lowest position, bed alarm on, and call bell within reach.

## 2023-07-13 NOTE — Progress Notes (Signed)
Pt's non-violent restraints were removed at 1514 when less restrictive interventions were effective. No signs of injury to where restraints were applied. Pt is calm and no longer attempting to get out of bed prior to using the call bell.    At 1624, MD put in orders to transfer pt to 6N[facility]18. Report was called/given to [facility] on 6N. Pt was transported via bed with dinner and personal belongings. Central tele was notified about transfer.

## 2023-07-13 NOTE — H&P (Incomplete)
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 Placed on Plavix and aspirin for 3 weeks beginning 8/09. Echo  with ~70-75% EF and grade 1 diastolic dysfunction. Admitted to CIR on 8/10. On 07/02/2023 had findings of some cognitive changes with nausea vomiting right upper quadrant discomfort. Low-grade fever 99.1 WBC 16,100 lactic acid elevated 2.9 with follow-up WBC of 22,500. Transferred to acute TRH service. CT c/w acute cholecystitis. Antibiotics continue. General surgery and IR consulted for cholecystostomy drain and this was placed 8/20. Operative cultures positive for Klebsiella, patient treated with IV Rocephin. Hospital stay has been complicated by poor p.o. intakes, ongoing encephalopathy, type 2 diabetes, AKI on maintenance fluids, and abdominal and back pain on oxycodone and tramadol due to Dilaudid causing hypotension. Percutaneous cholecystostomy drain to remain in place at least 6 weeks. Recommend fluoroscopy with injection of the drain in IR to evaluate for patency of the cystic duct. IR will follow up with the patient in approximately 6 weeks. A scheduler from their office will call the patient with a date/time of his appointment. Palliative care consulted on 8/25 for goals of care. Follow-up with patient and spouse on 8/26 and patient has decided on DNR/DNI. Sepsis resolved. Statin on hold per family request per Dr. Benjamine Mola. CMP with persistent mild elevation of AST. There was consideration to placing Cortrak due to poor PO intake, however notes indicate he is eating better last 24 hours. Reviewing Is and Os, there is no PO intake documented back to 8/26. 8/29: Worked on sit>stand from recliner to RW, needed max A +2, esp on L side. After several trials  pt was more fatigued and could not progress to ambulation. The patient requires inpatient medicine and rehabilitation evaluations and services for ongoing dysfunction secondary to scattered small infarct right basal ganglia, frontal, parietal, occipital lobes-MCA/PCA watershed distribution.     He has a supportive spouse with 24/7 care giving available, lives in single level home with 3 steps to enter. Reports that he quit smoking about 19 years ago. His smoking use included cigarettes. He started smoking about 64 years ago. He has a 90 pack-year smoking history. His smokeless tobacco use includes chew. He reports that he does not drink alcohol and does not use drugs.   ROS 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HCC)    Arthritis    "back, hips, legs" (06/10/2015)   Chest pain    Chronic lower back pain    Depression    DJD (degenerative joint disease)    Hyperlipidemia 06/11/2015   Hypertension    Hypothyroidism    Kidney stone    Kidney stone    Type II diabetes mellitus (HCC)    Past Surgical History:  Procedure Laterality Date   CARDIAC CATHETERIZATION N/A 06/11/2015   Procedure: Left Heart Cath and Coronary Angiography;  Surgeon: Lyn Records, MD;  Location: [facility] INVASIVE CV LAB;  Service: Cardiovascular;  Laterality: N/A;   COLONOSCOPY  06/21/2011   Procedure: COLONOSCOPY;  Surgeon: Malissa Hippo, MD;  [facility];  Service: Endoscopy;  Laterality: N/A;   COLONOSCOPY     COLONOSCOPY N/A 09/20/2017   Procedure: COLONOSCOPY;  Surgeon: Malissa Hippo, MD;  Location: [facility];  Service: Endoscopy;  Laterality:  N/A;  1200   CORONARY ARTERY BYPASS GRAFT N/A 06/14/2015   Procedure: CORONARY ARTERY BYPASS GRAFTING times four using Left Internal mammary artery and right leg Saphenous vein graft;  Surgeon: Kerin Perna, MD;  Location: [facility] OR;  Service: Open Heart Surgery;  Laterality: N/A;   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   HAND SURGERY      IR PERC CHOLECYSTOSTOMY  07/03/2023   SKIN CANCER EXCISION Left    "forearm"   TEE WITHOUT CARDIOVERSION N/A 06/14/2015   Procedure: TRANSESOPHAGEAL ECHOCARDIOGRAM (TEE);  Surgeon: Kerin Perna, MD;  Location: [facility] OR;  Service: Open Heart Surgery;  Laterality: N/A;   Family History  Problem Relation Age of Onset   Stroke Mother    Hypertension Mother    Stomach cancer Father    Liver cancer Father    Cancer Father    Rectal cancer Brother    Cancer Brother    Heart attack Neg Hx    Social History:  reports that he quit smoking about 19 years ago. His smoking use included cigarettes. He started smoking about 64 years ago. He has a 90 pack-year smoking history. His smokeless tobacco use includes chew. He reports that he does not drink alcohol and does not use drugs. Allergies:  Allergies  Allergen Reactions   Penicillins Rash        Medications Prior to Admission  Medication Sig Dispense Refill   acetaminophen (TYLENOL) 325 MG tablet Take 2 tablets (650 mg total) by mouth every 4 (four) hours as needed for mild pain (or temp > 37.5 C (99.5 F)).     aspirin EC 81 MG tablet Take 1 tablet (81 mg total) by mouth daily with breakfast. -Take Aspirin 81 mg daily along with Plavix 75 mg daily for 21 days then after that STOP the Plavix  and continue ONLY Aspirin 81 mg daily indefinitely-- 30 tablet 11   atorvastatin (LIPITOR) 40 MG tablet Take 1 tablet (40 mg total) by mouth daily. 30 tablet 4   blood glucose meter kit and supplies 1 each by Other route 4 (four) times daily. Dispense based on patient and insurance preference. Use up to four times daily as directed. (FOR ICD-10 E10.9, E11.9). 1 each 0   clopidogrel (PLAVIX) 75 MG tablet Take 1 tablet (75 mg total) by mouth daily. Take Aspirin 81 mg daily along with Plavix 75 mg daily for 21 days then after that STOP the Plavix  and continue ONLY Aspirin 81 mg daily indefinitely-- 30 tablet 0   glucose blood (ONETOUCH VERIO) test strip 1 each  by Other route 4 (four) times daily. Use as instructed 150 each 5   insulin aspart (NOVOLOG) 100 UNIT/ML FlexPen Inject 0-10 Units into the skin 3 (three) times daily with meals. Short acting insulin per sliding scale 0-10 ---:-- Insulin injection 0-10 Units 0-10 Units Subcutaneous, 3 times daily with meals CBG 70 - 120: 0 unit CBG 121 - 150: 0 unit  CBG 151 - 200: 1 unit CBG 201 - 250: 2 units CBG 251 - 300: 4 units CBG 301 - 350: 6 units  CBG 351 - 400: 8 units  CBG > 400: 10 units 15 mL 11   insulin glargine, 2 Unit Dial, (TOUJEO MAX SOLOSTAR) 300 UNIT/ML Solostar Pen Inject 42 Units into the skin at bedtime. 15 mL 0   Insulin Pen Needle (B-D ULTRAFINE III SHORT PEN) 31G X 8 MM MISC 1 each by Does not apply route as directed. 100  each 3   levothyroxine (SYNTHROID) 75 MCG tablet Take 1 tablet (75 mcg total) by mouth daily before breakfast. 90 tablet 0   losartan (COZAAR) 25 MG tablet Take 1 tablet (25 mg total) by mouth daily. 90 tablet 0   metoprolol tartrate (LOPRESSOR) 25 MG tablet Take 1 tablet (25 mg total) by mouth 2 (two) times daily. 180 tablet 0   senna-docusate (SENOKOT-S) 8.6-50 MG tablet Take 2 tablets by mouth at bedtime. 60 tablet 1   traMADol (ULTRAM) 50 MG tablet Take 1 tablet (50 mg total) by mouth every 6 (six) hours as needed. pain 30 tablet 0   traZODone (DESYREL) 100 MG tablet Take 1 tablet (100 mg total) by mouth at bedtime. 30 tablet 2      Home: Home Living Family/patient expects to be discharged to:: Private residence Living Arrangements: Spouse/significant other Available Help at Discharge: Family, Available 24 hours/day Type of Home: House Home Access: Stairs to enter [employer] of Steps: 3 Entrance Stairs-Rails: Left Home Layout: One level Bathroom Shower/Tub: Engineer, manufacturing systems: Handicapped height Bathroom Accessibility: Yes Home Equipment: None  Lives With: Spouse   Functional History: Prior Function Prior Level of Function :  Independent/Modified Independent, Driving Mobility Comments: Tourist information centre manager without AD ADLs Comments: Independent; drives  Functional Status:  Mobility: Bed Mobility Overal bed mobility: Needs Assistance Bed Mobility: Sit to Supine Rolling: Mod assist Sidelying to sit: Max assist, +2 for physical assistance Supine to sit: Mod assist, HOB elevated, +2 for physical assistance Sit to supine: Mod assist General bed mobility comments: when given extra time pt able to go down to R elbow and initiate LLE into bed but needed mod A to get it in fully. Was able to bring RLE into bed and scoot to L with min A. Transfers Overall transfer level: Needs assistance Equipment used: Rolling walker (2 wheels) Transfers: Sit to/from Stand, Bed to chair/wheelchair/BSC Sit to Stand: +2 safety/equipment, Max assist Bed to/from chair/wheelchair/BSC transfer type:: Stand pivot Stand pivot transfers: Max assist, +2 physical assistance Transfer via Lift Equipment: Stedy General transfer comment: Worked on mutliple sit>stands with RW then bed to chair transfers to R without AD.  Pt needed max A to get hips over arm of chair. Was able to use RUE to pull on bed rail to assist. Ambulation/Gait General Gait Details: to fatigued and lethargic to progress ambulation today Gait velocity: slow    ADL: ADL Overall ADL's : Needs assistance/impaired Eating/Feeding: Moderate assistance, Sitting Eating/Feeding Details (indicate cue type and reason): drink from cup Grooming: Wash/dry hands, Wash/dry face, Set up, Sitting Upper Body Bathing: Maximal assistance, Sitting Upper Body Bathing Details (indicate cue type and reason): simulated Lower Body Bathing: Maximal assistance, +2 for physical assistance, Sit to/from stand Lower Body Bathing Details (indicate cue type and reason): simulated Upper Body Dressing : Minimal assistance, Sitting Lower Body Dressing: Total assistance Lower Body Dressing Details (indicate  cue type and reason): gripper socks only Toilet Transfer: Moderate assistance, +2 for physical assistance, +2 for safety/equipment, BSC/3in1 Toilet Transfer Details (indicate cue type and reason): simulated with stedy Toileting- Clothing Manipulation and Hygiene: Total assistance, Sit to/from stand, Sitting/lateral lean Toileting - Clothing Manipulation Details (indicate cue type and reason): patient unable to complete peri care when standing in stedy Functional mobility during ADLs: Maximal assistance, +2 for safety/equipment (standing EOB) General ADL Comments: Patient session focus on pre-gait training in hallway with PT (refer to PT note) and functional mobility to work on ADL tolerance. Patient remains limited  with regard to lower body ADLs and toileting, but improved command following and static balance in stedy and when using rail in hallway to address functional deficits. Patient remains at a mod-max A level for ADL management, and mod of 2 for transfers. OT continues to highly recommend intensive therapy services. OT will continue to follow.  Cognition: Cognition Overall Cognitive Status: Impaired/Different from baseline Orientation Level: Oriented to person, Oriented to place, Oriented to situation Cognition Arousal: Lethargic Behavior During Therapy: Flat affect Overall Cognitive Status: Impaired/Different from baseline Area of Impairment: Orientation, Attention, Memory, Following commands, Safety/judgement, Awareness, Problem solving Orientation Level: Disoriented to, Time, Situation Current Attention Level: Sustained, Selective Memory: Decreased short-term memory Following Commands: Follows one step commands with increased time, Follows one step commands consistently, Follows multi-step commands inconsistently Safety/Judgement: Decreased awareness of safety, Decreased awareness of deficits Awareness: Intellectual Problem Solving: Slow processing, Decreased initiation, Difficulty  sequencing, Requires verbal cues, Requires tactile cues General Comments: confusion getting better but quite lethargic today  Physical Exam: Blood pressure (!) 174/78, pulse 83, temperature 98 F (36.7 C), temperature source Oral, resp. rate 14, weight 88.8 kg, SpO2 96%. Physical Exam  Results for orders placed or performed during the hospital encounter of 07/02/23 (from the past 48 hour(s))  Glucose, capillary     Status: Abnormal   Collection Time: 07/11/23 11:11 AM  Result Value Ref Range   Glucose-Capillary 262 (H) 70 - 99 mg/dL    Comment: Glucose reference range applies only to samples taken after fasting for at least 8 hours.  Glucose, capillary     Status: Abnormal   Collection Time: 07/11/23  2:58 PM  Result Value Ref Range   Glucose-Capillary 173 (H) 70 - 99 mg/dL    Comment: Glucose reference range applies only to samples taken after fasting for at least 8 hours.  Glucose, capillary     Status: Abnormal   Collection Time: 07/11/23  9:10 PM  Result Value Ref Range   Glucose-Capillary 242 (H) 70 - 99 mg/dL    Comment: Glucose reference range applies only to samples taken after fasting for at least 8 hours.  CBC     Status: Abnormal   Collection Time: 07/12/23  3:30 AM  Result Value Ref Range   WBC 12.4 (H) 4.0 - 10.5 K/uL   RBC 3.73 (L) 4.22 - 5.81 MIL/uL   Hemoglobin 11.5 (L) 13.0 - 17.0 g/dL   HCT 16.1 (L) 09.6 - 04.5 %   MCV 90.6 80.0 - 100.0 fL   MCH 30.8 26.0 - 34.0 pg   MCHC 34.0 30.0 - 36.0 g/dL   RDW 40.9 81.1 - 91.4 %   Platelets 258 150 - 400 K/uL   nRBC 0.0 0.0 - 0.2 %    Comment: Performed at [facility], [address] [place], [place] [address]  Basic metabolic panel     Status: Abnormal   Collection Time: 07/12/23  3:30 AM  Result Value Ref Range   Sodium 136 135 - 145 mmol/L   Potassium 4.0 3.5 - 5.1 mmol/L   Chloride 101 98 - 111 mmol/L   CO2 25 22 - 32 mmol/L   Glucose, Bld 144 (H) 70 - 99 mg/dL    Comment: Glucose reference range  applies only to samples taken after fasting for at least 8 hours.   BUN 19 8 - 23 mg/dL   Creatinine, Ser 6.21 (H) 0.61 - 1.24 mg/dL   Calcium 8.1 (L) 8.9 - 10.3 mg/dL  GFR, Estimated 47 (L) >60 mL/min    Comment: (NOTE) Calculated using the CKD-EPI Creatinine Equation (2021)    Anion gap 10 5 - 15    Comment: Performed at [facility], [address] [place], [place] [address]  Magnesium     Status: None   Collection Time: 07/12/23  3:30 AM  Result Value Ref Range   Magnesium 1.7 1.7 - 2.4 mg/dL    Comment: Performed at [facility], [address], [place], [place] [address]  Glucose, capillary     Status: Abnormal   Collection Time: 07/12/23  7:25 AM  Result Value Ref Range   Glucose-Capillary 150 (H) 70 - 99 mg/dL    Comment: Glucose reference range applies only to samples taken after fasting for at least 8 hours.  Glucose, capillary     Status: Abnormal   Collection Time: 07/12/23 11:13 AM  Result Value Ref Range   Glucose-Capillary 220 (H) 70 - 99 mg/dL    Comment: Glucose reference range applies only to samples taken after fasting for at least 8 hours.  Glucose, capillary     Status: Abnormal   Collection Time: 07/12/23  3:00 PM  Result Value Ref Range   Glucose-Capillary 185 (H) 70 - 99 mg/dL    Comment: Glucose reference range applies only to samples taken after fasting for at least 8 hours.  Glucose, capillary     Status: Abnormal   Collection Time: 07/12/23  9:00 PM  Result Value Ref Range   Glucose-Capillary 141 (H) 70 - 99 mg/dL    Comment: Glucose reference range applies only to samples taken after fasting for at least 8 hours.  Glucose, capillary     Status: Abnormal   Collection Time: 07/13/23  7:57 AM  Result Value Ref Range   Glucose-Capillary 147 (H) 70 - 99 mg/dL    Comment: Glucose reference range applies only to samples taken after fasting for at least 8 hours.   No results found.    Blood pressure (!) 174/78, pulse 83, temperature 98  F (36.7 C), temperature source Oral, resp. rate 14, weight 88.8 kg, SpO2 96%.  Medical Problem List and Plan: 1. Functional deficits secondary to ***  -patient may *** shower  -ELOS/Goals: ***  2.  Antithrombotics: -DVT/anticoagulation:  Pharmaceutical: Heparin  -antiplatelet therapy: Aspirin and Plavix for three weeks followed by aspirin alone>>last dose of Plavix initially today, 8/30...  3. Pain Management: chronic back pain -Tylenol as needed  -continue tramadol 50 mg TID  4. Mood/Behavior/Sleep: LCSW to evaluate and provide emotional support  -continue trazodone 100 mg q HS  -antipsychotic agents: continue Zyprexa 5 mg daily  5. Neuropsych/cognition: This patient *** capable of making decisions on *** own behalf.  6. Skin/Wound Care: Routine skin care checks   7. Fluids/Electrolytes/Nutrition: Routine Is and Os and follow-up chemistries  -dys 3 with thin liquids  -SLP eval  8: Hypertension: monitor TID and prn (home Cozaar 25 mg daily held)  -continue metoprolol 50 mg BID  9: Hyperlipidemia: statin held (follow-up LFTs and restart atorvastatin 40 mg as appropriate)  10: Hypothyroidism: continue Synthroid  11: Acute cholecystitis s/p cholecystostomy tube  -routine drain care  -continue Levaquin 250 mg daily  12: DM-2: CBGs QID; A1c = 8.8%; on insulin at home  -continue SSI  13: Hx of CAD s/p CABG 2016: on Plavix, asa, BB; ? resume statin  14: CKD IIIa: baseline Cr ~1.3  -follow-up BMP  15: GI prophylaxis: continue  Protonix ***  Milinda Antis, PA-C 07/13/2023

## 2023-07-13 NOTE — Progress Notes (Signed)
Patient admitted to unit. Transferred via bed by staff. Biliary drain present. Left PIV. Telemetry placed. Oriented to room and unit. Patient currently calm and cooperative. Nurse spoke with wife via phone. Personal belongings brought to room with patient.

## 2023-07-13 NOTE — Progress Notes (Signed)
Inpatient Rehabilitation Admissions Coordinator   CIR bed is available to admit him on Saturday. Dr Neoma Laming, acute team and [facility] made aware. I will make the arrangements. Dr Shearon Stalls will see him Saturday. [id number] nurse can call report to rehab Saturday at 12 noon to [phone].  Ottie Glazier, RN, MSN Rehab Admissions Coordinator [phone] 07/13/2023 10:03 AM

## 2023-07-13 NOTE — Progress Notes (Signed)
[facility]  PROGRESS NOTE  Hunter Sparks XBJ:[id number] DOB: 1946/09/06 DOA: 07/02/2023 PCP: Benita Stabile, MD   Brief HPI:   Recently discharged from the hospital will on June 23, 2023 after spending 2 days in the hospital for an acute stroke. 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 He has had left hemiparesis since then. D/c'd to CIR Patient was at rehab where he complained of severe abdominal pain for 2 days.  WBC was elevated 22.5.  He was sent to the hospital for further workup.  Right upper quadrant ultrasound showed acute cholecystitis.  General surgery was consulted and recommended IR place drain.  Drain placed on 8/20.  Hospital stay complicated by delirium and poor PO intake-- may need cortrack.  Slow to improve.  Plan back to CIR when bed is available.  Assessment/Plan:  Acute cholecystitis Underwent placement of cholecystostomy drain per IR. Percutaneous cholecystostomy drain to remain in place at least 6 weeks. Recommend fluoroscopy with injection of the drain in IR to evaluate for patency of the cystic duct. IR will follow up with the patient in approximately 6 weeks. A scheduler from their office will call the patient with a date/time of his appointment.  Patient to also follow-up with general surgery. -High risk for surgery given being on Plavix and recent stroke Cultures from the cholecystostomy drain growing Klebsiella and Enterococcus.  Patient was on ceftriaxone.  Changed over to Levaquin.  Await final sensitivities. -Will need outpatient follow-up with general surgery to discuss timing of cholecystectomy if GOC do not change There was also concern for ileus based on imaging studies.  However this appears to have improved.  Abdomen is benign.  Diet was advanced which he appears to be tolerating  well.  Sepsis due to acute cholecystitis -Presented with sepsis physiology due to acute cholecystitis, -Initial lactic acid 2.9; improved to 1.2 -sepsis picture resolved  Hemiparesis affecting left side as effect of CVA -Recent CVA causing left hemiparesis -? back to the inpatient rehab once medically stable -Continue Plavix, aspirin  Diabetes mellitus type 2 -SSI --hold insulin due to low BS from poor PO intake  CAD -Continue aspirin, Lopressor.  Statin currently on hold per family request.  Should be able to resume at discharge.  Hypothyroidism -Continue Synthroid  AKI -appears pre-renal -improving  Delirium -precautions -removed mittens  Hypokalemia Recheck labs tomorrow.  Poor PO intake Initially there was concern for poor oral intake.  Core track was being considered.  However patient's appetite improved.  Once his ileus resolved he was able to tolerate his diet.  Subjective: Feels well.  No complaints offered.  Looking forward to going back to rehab.  Objective:  Vitals:   07/13/23 0345 07/13/23 0346 07/13/23 0453 07/13/23 0758  BP:  (!) 156/64  (!) 174/78  Pulse: 68   83  Resp:  10  14  Temp:  98 F (36.7 C)  98 F (36.7 C)  TempSrc:  Oral  Oral  SpO2: 95%   96%  Weight:   88.8 kg     General appearance: Awake alert.  In no distress Resp: Clear to auscultation bilaterally.  Normal effort Cardio: S1-S2 is normal regular.  No S3-S4.  No rubs murmurs or bruit GI: Abdomen is soft.  Nontender nondistended.  Bowel sounds are present normal.  No masses organomegaly.  Cholecystostomy drain is noted.  Basic Metabolic Panel: Recent Labs  Lab 07/07/23 0410 07/08/23 0635 07/09/23 0554 07/10/23 0635 07/12/23 0330  NA 136 138 137 135 136  K 4.1 4.0 3.8 3.4* 4.0  CL 97* 101 98 97* 101  CO2 27 28 28 26 25   GLUCOSE 141* 116* 130* 141* 144*  BUN 32* 29* 25* 17 19  CREATININE 1.53* 1.46* 1.47* 1.33* 1.51*  CALCIUM 8.3* 8.3* 8.4* 8.3* 8.1*  MG  --    --   --   --  1.7    CBC: Recent Labs  Lab 07/07/23 0410 07/08/23 0409 07/09/23 0554 07/10/23 0635 07/12/23 0330  WBC 15.9* 15.8* 14.8* 16.1* 12.4*  HGB 12.9* 13.9 12.9* 13.3 11.5*  HCT 38.3* 42.0 38.8* 39.1 33.8*  MCV 91.0 92.3 91.5 89.1 90.6  PLT 199 195 224 262 258    LFT Recent Labs  Lab 07/07/23 0410 07/08/23 0635 07/09/23 0554  AST 59* 72* 52*  ALT 38 44 42  ALKPHOS 100 101 105  BILITOT 0.6 0.8 0.8  PROT 5.5* 5.4* 5.5*  ALBUMIN 2.1* 2.0* 2.1*         DVT prophylaxis: Heparin  Code Status: DNR  Family Communication: No family at bedside  Plan is for transfer back to inpatient rehabilitation when they have a bed available.  CONSULTS  General Surgery IR Palliative care    Hunter Sparks   [facility] If 7PM-7AM, please contact night-coverage at [email or web address],   07/13/2023, 9:57 AM  LOS: 10 days

## 2023-07-13 NOTE — Progress Notes (Signed)
Occupational Therapy Treatment Patient Details Name: Hunter Sparks MRN: [id number] DOB: [birth year] Today's Date: 07/13/2023   History of present illness Pt is 77 yo male who presents on 07/02/23 from CIR with acute abdominal pain, underwent drain placement on 07/03/23, to have cholecystectomy soon. PMH: acute infarct in the right cerebral hemisphere involving the basal ganglia, frontal, parietal and occipital cortex in the MCA and the PCA watershed distributions 06/21/23 that led to CIR stay, LBP, DDD, HLD, HTN, hypothyroidism, DM2   OT comments  Patient received in supine, alert, and eager to participate. Patient demonstrated gains with bed mobility with mod assist to get to EOB. Patient demonstrating left lateral leaning once on EOB and required min assist for balance and progressed to supervision for ~5 minutes. Transfer performed to recliner with face to face technique to allow left knee to be blocked. Patient able to perform grooming tasks with supervision and cues for sequencing. Patient will benefit from intensive inpatient follow up therapy, >3 hours/day to continue to address bathing, dressing, and grooming. Acute OT to continue to follow.       If plan is discharge home, recommend the following:  A lot of help with walking and/or transfers;Help with stairs or ramp for entrance;Assist for transportation;A lot of help with bathing/dressing/bathroom;Assistance with cooking/housework;Direct supervision/assist for financial management;Supervision due to cognitive status;Direct supervision/assist for medications management   Equipment Recommendations  Other (comment) (defer to next venue)    Recommendations for Other Services      Precautions / Restrictions Precautions Precautions: Fall;Other (comment) Precaution Comments: left hemiparesis Restrictions Weight Bearing Restrictions: No Other Position/Activity Restrictions: Rt sided percutaneous drain       Mobility Bed Mobility Overal  bed mobility: Needs Assistance Bed Mobility: Supine to Sit   Sidelying to sit: Mod assist       General bed mobility comments: instructions on rail use and assistance with trunk and scooting to EOB    Transfers Overall transfer level: Needs assistance Equipment used: None Transfers: Sit to/from Stand, Bed to chair/wheelchair/BSC Sit to Stand: Max assist Stand pivot transfers: Max assist         General transfer comment: face to face technique with left knee blocked     Balance Overall balance assessment: Needs assistance Sitting-balance support: Single extremity supported, Feet supported Sitting balance-Leahy Scale: Poor Sitting balance - Comments: min assist initially and progressed to supervision Postural control: Left lateral lean Standing balance support: During functional activity, Bilateral upper extremity supported, Reliant on assistive device for balance Standing balance-Leahy Scale: Poor Standing balance comment: stood for transfer                           ADL either performed or assessed with clinical judgement   ADL Overall ADL's : Needs assistance/impaired     Grooming: Wash/dry hands;Wash/dry face;Oral care;Brushing hair;Supervision/safety;Sitting;Cueing for sequencing                   Toilet Transfer: Maximal Dentist Details (indicate cue type and reason): simulated to chair                Extremity/Trunk Assessment              Vision       Perception     Praxis      Cognition Arousal: Alert Behavior During Therapy: WFL for tasks assessed/performed Overall Cognitive Status: Impaired/Different from baseline Area of Impairment: Orientation, Attention, Memory, Following commands, Safety/judgement,  Awareness, Problem solving                 Orientation Level: Disoriented to, Time, Situation Current Attention Level: Sustained, Selective Memory: Decreased short-term memory Following  Commands: Follows one step commands with increased time, Follows one step commands consistently, Follows multi-step commands inconsistently Safety/Judgement: Decreased awareness of safety, Decreased awareness of deficits Awareness: Intellectual Problem Solving: Slow processing, Decreased initiation, Difficulty sequencing, Requires verbal cues, Requires tactile cues General Comments: alert but believed he could transfer to recliner without assistance        Exercises      Shoulder Instructions       General Comments VSS on RA    Pertinent Vitals/ Pain       Pain Assessment Pain Assessment: No/denies pain Pain Intervention(s): Monitored during session  Home Living                                          Prior Functioning/Environment              Frequency  Min 1X/week        Progress Toward Goals  OT Goals(current goals can now be found in the care plan section)  Progress towards OT goals: Progressing toward goals  Acute Rehab OT Goals Patient Stated Goal: go to rehab OT Goal Formulation: With patient/family Time For Goal Achievement: 07/19/23 Potential to Achieve Goals: Good ADL Goals Pt Will Perform Eating: with set-up;sitting Pt Will Perform Grooming: with supervision;sitting Pt Will Perform Upper Body Bathing: with min assist;sitting Pt Will Perform Lower Body Bathing: sit to/from stand;with mod assist Pt Will Perform Lower Body Dressing: Independently;sitting/lateral leans Pt Will Transfer to Toilet: with mod assist;bedside commode;stand pivot transfer Pt Will Perform Toileting - Clothing Manipulation and hygiene: Independently;sitting/lateral leans Pt/caregiver will Perform Home Exercise Program: Increased strength;Left upper extremity;Independently Additional ADL Goal #1: Pt will maintain sustained attention to selfcare tasks for greater than 10 mins with less than mod instructional for cueing.  Plan      Co-evaluation                  AM-PAC OT "6 Clicks" Daily Activity     Outcome Measure   Help from another person eating meals?: A Little Help from another person taking care of personal grooming?: A Little Help from another person toileting, which includes using toliet, bedpan, or urinal?: Total Help from another person bathing (including washing, rinsing, drying)?: A Lot Help from another person to put on and taking off regular upper body clothing?: A Lot Help from another person to put on and taking off regular lower body clothing?: Total 6 Click Score: 12    End of Session Equipment Utilized During Treatment: Gait belt  OT Visit Diagnosis: Unsteadiness on feet (R26.81);Other abnormalities of gait and mobility (R26.89);Muscle weakness (generalized) (M62.81);Hemiplegia and hemiparesis;Other symptoms and signs involving the nervous system (R29.898);Feeding difficulties (R63.3);Pain;Other symptoms and signs involving cognitive function Hemiplegia - Right/Left: Left Hemiplegia - dominant/non-dominant: Non-Dominant Hemiplegia - caused by: Cerebral infarction   Activity Tolerance Patient tolerated treatment well   Patient Left in chair;with call bell/phone within reach;with chair alarm set;with family/visitor present   Nurse Communication Mobility status        Time: 8657-8469 OT Time Calculation (min): 26 min  Charges: OT General Charges $OT Visit: 1 Visit OT Treatments $Self Care/Home Management : 8-22 mins $Therapeutic Activity: 8-22 mins  Alfonse Flavors, OTA Acute Rehabilitation Services  Office [phone]   Dewain Penning 07/13/2023, 11:45 AM

## 2023-07-14 ENCOUNTER — Inpatient Hospital Stay (HOSPITAL_COMMUNITY)
Admission: RE | Admit: 2023-07-14 | Discharge: 2023-08-04 | DRG: 056 | Disposition: A | Discharge status: Home Health | Payer: PPO | Source: Intra-hospital | Attending: Physical Medicine & Rehabilitation | Admitting: Physical Medicine & Rehabilitation

## 2023-07-14 ENCOUNTER — Other Ambulatory Visit: Payer: Self-pay

## 2023-07-14 ENCOUNTER — Encounter (HOSPITAL_COMMUNITY): Payer: Self-pay | Admitting: Physical Medicine and Rehabilitation

## 2023-07-14 DIAGNOSIS — F02B18 Dementia in other diseases classified elsewhere, moderate, with other behavioral disturbance: Secondary | ICD-10-CM

## 2023-07-14 DIAGNOSIS — M545 Low back pain, unspecified: Secondary | ICD-10-CM | POA: Diagnosis not present

## 2023-07-14 DIAGNOSIS — E785 Hyperlipidemia, unspecified: Secondary | ICD-10-CM | POA: Diagnosis present

## 2023-07-14 DIAGNOSIS — E1122 Type 2 diabetes mellitus with diabetic chronic kidney disease: Secondary | ICD-10-CM | POA: Diagnosis present

## 2023-07-14 DIAGNOSIS — I129 Hypertensive chronic kidney disease with stage 1 through stage 4 chronic kidney disease, or unspecified chronic kidney disease: Secondary | ICD-10-CM | POA: Diagnosis not present

## 2023-07-14 DIAGNOSIS — T85520A Displacement of bile duct prosthesis, initial encounter: Secondary | ICD-10-CM | POA: Diagnosis not present

## 2023-07-14 DIAGNOSIS — G8929 Other chronic pain: Secondary | ICD-10-CM | POA: Diagnosis present

## 2023-07-14 DIAGNOSIS — Z8249 Family history of ischemic heart disease and other diseases of the circulatory system: Secondary | ICD-10-CM | POA: Diagnosis not present

## 2023-07-14 DIAGNOSIS — F17220 Nicotine dependence, chewing tobacco, uncomplicated: Secondary | ICD-10-CM | POA: Diagnosis not present

## 2023-07-14 DIAGNOSIS — N2889 Other specified disorders of kidney and ureter: Secondary | ICD-10-CM | POA: Diagnosis not present

## 2023-07-14 DIAGNOSIS — R739 Hyperglycemia, unspecified: Secondary | ICD-10-CM | POA: Diagnosis not present

## 2023-07-14 DIAGNOSIS — K8001 Calculus of gallbladder with acute cholecystitis with obstruction: Secondary | ICD-10-CM | POA: Diagnosis not present

## 2023-07-14 DIAGNOSIS — F0280 Dementia in other diseases classified elsewhere without behavioral disturbance: Secondary | ICD-10-CM | POA: Diagnosis not present

## 2023-07-14 DIAGNOSIS — K5901 Slow transit constipation: Secondary | ICD-10-CM | POA: Diagnosis not present

## 2023-07-14 DIAGNOSIS — I69354 Hemiplegia and hemiparesis following cerebral infarction affecting left non-dominant side: Principal | ICD-10-CM

## 2023-07-14 DIAGNOSIS — Z823 Family history of stroke: Secondary | ICD-10-CM | POA: Diagnosis not present

## 2023-07-14 DIAGNOSIS — M25561 Pain in right knee: Secondary | ICD-10-CM | POA: Diagnosis not present

## 2023-07-14 DIAGNOSIS — M549 Dorsalgia, unspecified: Secondary | ICD-10-CM | POA: Diagnosis present

## 2023-07-14 DIAGNOSIS — R159 Full incontinence of feces: Secondary | ICD-10-CM | POA: Diagnosis present

## 2023-07-14 DIAGNOSIS — N200 Calculus of kidney: Secondary | ICD-10-CM | POA: Diagnosis not present

## 2023-07-14 DIAGNOSIS — K5900 Constipation, unspecified: Secondary | ICD-10-CM | POA: Diagnosis not present

## 2023-07-14 DIAGNOSIS — E43 Unspecified severe protein-calorie malnutrition: Secondary | ICD-10-CM | POA: Diagnosis present

## 2023-07-14 DIAGNOSIS — Z85828 Personal history of other malignant neoplasm of skin: Secondary | ICD-10-CM | POA: Diagnosis not present

## 2023-07-14 DIAGNOSIS — Z5320 Procedure and treatment not carried out because of patient's decision for unspecified reasons: Secondary | ICD-10-CM | POA: Diagnosis not present

## 2023-07-14 DIAGNOSIS — Z794 Long term (current) use of insulin: Secondary | ICD-10-CM

## 2023-07-14 DIAGNOSIS — T85590A Other mechanical complication of bile duct prosthesis, initial encounter: Secondary | ICD-10-CM | POA: Diagnosis not present

## 2023-07-14 DIAGNOSIS — Z800 Family history of malignant neoplasm of digestive organs: Secondary | ICD-10-CM

## 2023-07-14 DIAGNOSIS — Z66 Do not resuscitate: Secondary | ICD-10-CM | POA: Diagnosis not present

## 2023-07-14 DIAGNOSIS — Z6822 Body mass index (BMI) 22.0-22.9, adult: Secondary | ICD-10-CM

## 2023-07-14 DIAGNOSIS — Z86010 Personal history of colonic polyps: Secondary | ICD-10-CM

## 2023-07-14 DIAGNOSIS — E039 Hypothyroidism, unspecified: Secondary | ICD-10-CM | POA: Diagnosis not present

## 2023-07-14 DIAGNOSIS — Z87442 Personal history of urinary calculi: Secondary | ICD-10-CM | POA: Diagnosis not present

## 2023-07-14 DIAGNOSIS — G9341 Metabolic encephalopathy: Secondary | ICD-10-CM | POA: Diagnosis not present

## 2023-07-14 DIAGNOSIS — I10 Essential (primary) hypertension: Secondary | ICD-10-CM | POA: Diagnosis not present

## 2023-07-14 DIAGNOSIS — N1831 Chronic kidney disease, stage 3a: Secondary | ICD-10-CM | POA: Diagnosis not present

## 2023-07-14 DIAGNOSIS — Z79899 Other long term (current) drug therapy: Secondary | ICD-10-CM | POA: Diagnosis not present

## 2023-07-14 DIAGNOSIS — I2510 Atherosclerotic heart disease of native coronary artery without angina pectoris: Secondary | ICD-10-CM | POA: Diagnosis present

## 2023-07-14 DIAGNOSIS — Y828 Other medical devices associated with adverse incidents: Secondary | ICD-10-CM | POA: Diagnosis not present

## 2023-07-14 DIAGNOSIS — Z7902 Long term (current) use of antithrombotics/antiplatelets: Secondary | ICD-10-CM

## 2023-07-14 DIAGNOSIS — N179 Acute kidney failure, unspecified: Secondary | ICD-10-CM | POA: Diagnosis not present

## 2023-07-14 DIAGNOSIS — I639 Cerebral infarction, unspecified: Secondary | ICD-10-CM | POA: Diagnosis present

## 2023-07-14 DIAGNOSIS — Z951 Presence of aortocoronary bypass graft: Secondary | ICD-10-CM

## 2023-07-14 DIAGNOSIS — R32 Unspecified urinary incontinence: Secondary | ICD-10-CM

## 2023-07-14 DIAGNOSIS — K8000 Calculus of gallbladder with acute cholecystitis without obstruction: Secondary | ICD-10-CM | POA: Diagnosis not present

## 2023-07-14 DIAGNOSIS — K819 Cholecystitis, unspecified: Secondary | ICD-10-CM

## 2023-07-14 DIAGNOSIS — M25562 Pain in left knee: Secondary | ICD-10-CM | POA: Diagnosis not present

## 2023-07-14 DIAGNOSIS — Z79890 Hormone replacement therapy (postmenopausal): Secondary | ICD-10-CM

## 2023-07-14 DIAGNOSIS — K810 Acute cholecystitis: Secondary | ICD-10-CM | POA: Diagnosis not present

## 2023-07-14 DIAGNOSIS — I878 Other specified disorders of veins: Secondary | ICD-10-CM | POA: Diagnosis not present

## 2023-07-14 DIAGNOSIS — R270 Ataxia, unspecified: Secondary | ICD-10-CM | POA: Diagnosis present

## 2023-07-14 DIAGNOSIS — Z7982 Long term (current) use of aspirin: Secondary | ICD-10-CM

## 2023-07-14 DIAGNOSIS — R609 Edema, unspecified: Secondary | ICD-10-CM | POA: Diagnosis not present

## 2023-07-14 DIAGNOSIS — Z880 Allergy status to penicillin: Secondary | ICD-10-CM

## 2023-07-14 LAB — BASIC METABOLIC PANEL
Anion gap: 12 (ref 5–15)
BUN: 16 mg/dL (ref 8–23)
CO2: 23 mmol/L (ref 22–32)
Calcium: 8.4 mg/dL — ABNORMAL LOW (ref 8.9–10.3)
Chloride: 97 mmol/L — ABNORMAL LOW (ref 98–111)
Creatinine, Ser: 1.35 mg/dL — ABNORMAL HIGH (ref 0.61–1.24)
GFR, Estimated: 54 mL/min — ABNORMAL LOW (ref >60)
Glucose, Bld: 139 mg/dL — ABNORMAL HIGH (ref 70–99)
Potassium: 4 mmol/L (ref 3.5–5.1)
Sodium: 132 mmol/L — ABNORMAL LOW (ref 135–145)

## 2023-07-14 LAB — GLUCOSE, CAPILLARY
Glucose-Capillary: 139 mg/dL — ABNORMAL HIGH (ref 70–99)
Glucose-Capillary: 150 mg/dL — ABNORMAL HIGH (ref 70–99)
Glucose-Capillary: 193 mg/dL — ABNORMAL HIGH (ref 70–99)
Glucose-Capillary: 139 mg/dL — ABNORMAL HIGH (ref 70–99)

## 2023-07-14 LAB — CBC
HCT: 33.8 % — ABNORMAL LOW (ref 39.0–52.0)
Hemoglobin: 11.4 g/dL — ABNORMAL LOW (ref 13.0–17.0)
MCH: 30.6 pg (ref 26.0–34.0)
MCHC: 33.7 g/dL (ref 30.0–36.0)
MCV: 90.6 fL (ref 80.0–100.0)
Platelets: 305 10*3/uL (ref 150–400)
RBC: 3.73 MIL/uL — ABNORMAL LOW (ref 4.22–5.81)
RDW: 12.6 % (ref 11.5–15.5)
WBC: 10.5 10*3/uL (ref 4.0–10.5)
nRBC: 0 % (ref 0.0–0.2)

## 2023-07-14 MED ORDER — HEPARIN SODIUM (PORCINE) 5000 UNIT/ML IJ SOLN
5000.0000 [IU] | Freq: Three times a day (TID) | INTRAMUSCULAR | Status: DC
  Administered 2023-07-14 – 2023-08-04 (×61): 5000 [IU] via SUBCUTANEOUS
  Filled 2023-07-14 (×62): qty 1

## 2023-07-14 MED ORDER — LEVOTHYROXINE SODIUM 75 MCG PO TABS
75.0000 ug | ORAL_TABLET | Freq: Every day | ORAL | Status: DC
  Administered 2023-07-15 – 2023-08-04 (×21): 75 ug via ORAL
  Filled 2023-07-14 (×22): qty 1

## 2023-07-14 MED ORDER — METHOCARBAMOL 500 MG PO TABS
500.0000 mg | ORAL_TABLET | Freq: Four times a day (QID) | ORAL | Status: DC | PRN
  Filled 2023-07-14: qty 1

## 2023-07-14 MED ORDER — METOPROLOL TARTRATE 50 MG PO TABS
50.0000 mg | ORAL_TABLET | Freq: Two times a day (BID) | ORAL | Status: DC
  Administered 2023-07-14 – 2023-08-04 (×40): 50 mg via ORAL
  Filled 2023-07-14 (×43): qty 1

## 2023-07-14 MED ORDER — ASPIRIN 81 MG PO TBEC
81.0000 mg | DELAYED_RELEASE_TABLET | Freq: Every day | ORAL | Status: DC
  Administered 2023-07-15 – 2023-08-04 (×21): 81 mg via ORAL
  Filled 2023-07-14 (×21): qty 1

## 2023-07-14 MED ORDER — LEVOFLOXACIN 250 MG PO TABS
250.0000 mg | ORAL_TABLET | ORAL | Status: DC
  Administered 2023-07-15 – 2023-07-23 (×9): 250 mg via ORAL
  Filled 2023-07-14 (×9): qty 1

## 2023-07-14 MED ORDER — OLANZAPINE 5 MG PO TABS
5.0000 mg | ORAL_TABLET | Freq: Every day | ORAL | Status: DC
  Administered 2023-07-15 – 2023-07-22 (×8): 5 mg via ORAL
  Filled 2023-07-14 (×9): qty 1

## 2023-07-14 MED ORDER — LIDOCAINE 5 % EX PTCH
1.0000 | MEDICATED_PATCH | CUTANEOUS | Status: DC
  Administered 2023-07-15 – 2023-07-16 (×2): 1 via TRANSDERMAL
  Filled 2023-07-14 (×7): qty 1

## 2023-07-14 MED ORDER — TRAMADOL HCL 50 MG PO TABS
50.0000 mg | ORAL_TABLET | Freq: Three times a day (TID) | ORAL | Status: DC
  Administered 2023-07-14 – 2023-07-17 (×9): 50 mg via ORAL
  Filled 2023-07-14 (×9): qty 1

## 2023-07-14 MED ORDER — ORAL CARE MOUTH RINSE: 15.0000 mL | OROMUCOSAL | Status: DC | PRN

## 2023-07-14 MED ORDER — ACETAMINOPHEN 500 MG PO TABS
1000.0000 mg | ORAL_TABLET | Freq: Three times a day (TID) | ORAL | Status: DC
  Administered 2023-07-14 – 2023-07-17 (×9): 1000 mg via ORAL
  Filled 2023-07-14 (×9): qty 2

## 2023-07-14 MED ORDER — ONDANSETRON HCL 4 MG/2ML IJ SOLN: 4.0000 mg | Freq: Four times a day (QID) | INTRAMUSCULAR | Status: DC | PRN

## 2023-07-14 MED ORDER — SODIUM CHLORIDE 0.9 % IV SOLN: INTRAVENOUS | Status: DC | PRN

## 2023-07-14 MED ORDER — TRAZODONE HCL 50 MG PO TABS
100.0000 mg | ORAL_TABLET | Freq: Every day | ORAL | Status: DC
  Administered 2023-07-14 – 2023-07-16 (×3): 100 mg via ORAL
  Filled 2023-07-14 (×3): qty 2

## 2023-07-14 MED ORDER — ALUM & MAG HYDROXIDE-SIMETH 200-200-20 MG/5ML PO SUSP
30.0000 mL | ORAL | Status: DC | PRN
  Filled 2023-07-14: qty 30

## 2023-07-14 MED ORDER — PANTOPRAZOLE SODIUM 40 MG PO TBEC
40.0000 mg | DELAYED_RELEASE_TABLET | Freq: Every day | ORAL | Status: DC
  Administered 2023-07-15 – 2023-08-04 (×20): 40 mg via ORAL
  Filled 2023-07-14 (×21): qty 1

## 2023-07-14 MED ORDER — HEPARIN SODIUM (PORCINE) 5000 UNIT/ML IJ SOLN: 5000.0000 [IU] | Freq: Three times a day (TID) | INTRAMUSCULAR | Status: DC

## 2023-07-14 MED ORDER — FLEET ENEMA RE ENEM: 1.0000 | ENEMA | Freq: Once | RECTAL | Status: DC | PRN

## 2023-07-14 MED ORDER — ONDANSETRON HCL 4 MG PO TABS: 4.0000 mg | ORAL_TABLET | Freq: Four times a day (QID) | ORAL | Status: DC | PRN

## 2023-07-14 MED ORDER — CLOPIDOGREL BISULFATE 75 MG PO TABS
75.0000 mg | ORAL_TABLET | Freq: Every day | ORAL | Status: AC
  Administered 2023-07-15: 75 mg via ORAL
  Filled 2023-07-14: qty 1

## 2023-07-14 MED ORDER — INSULIN ASPART 100 UNIT/ML IJ SOLN
0.0000 [IU] | Freq: Three times a day (TID) | INTRAMUSCULAR | Status: DC
  Administered 2023-07-14 – 2023-07-15 (×2): 2 [IU] via SUBCUTANEOUS
  Administered 2023-07-15 – 2023-07-16 (×3): 1 [IU] via SUBCUTANEOUS
  Administered 2023-07-16 (×2): 2 [IU] via SUBCUTANEOUS
  Administered 2023-07-17 (×2): 1 [IU] via SUBCUTANEOUS
  Administered 2023-07-17: 2 [IU] via SUBCUTANEOUS
  Administered 2023-07-18 (×2): 1 [IU] via SUBCUTANEOUS
  Administered 2023-07-18: 2 [IU] via SUBCUTANEOUS
  Administered 2023-07-19: 1 [IU] via SUBCUTANEOUS
  Administered 2023-07-19 (×2): 2 [IU] via SUBCUTANEOUS
  Administered 2023-07-20: 3 [IU] via SUBCUTANEOUS
  Administered 2023-07-20 – 2023-07-21 (×4): 2 [IU] via SUBCUTANEOUS
  Administered 2023-07-21: 9 [IU] via SUBCUTANEOUS
  Administered 2023-07-22: 3 [IU] via SUBCUTANEOUS
  Administered 2023-07-22: 1 [IU] via SUBCUTANEOUS
  Administered 2023-07-22 – 2023-07-23 (×2): 3 [IU] via SUBCUTANEOUS
  Administered 2023-07-23: 1 [IU] via SUBCUTANEOUS
  Administered 2023-07-23: 2 [IU] via SUBCUTANEOUS
  Administered 2023-07-24: 3 [IU] via SUBCUTANEOUS
  Administered 2023-07-24 – 2023-07-25 (×4): 2 [IU] via SUBCUTANEOUS
  Administered 2023-07-25: 1 [IU] via SUBCUTANEOUS
  Administered 2023-07-26 (×2): 2 [IU] via SUBCUTANEOUS
  Administered 2023-07-26: 1 [IU] via SUBCUTANEOUS
  Administered 2023-07-27: 3 [IU] via SUBCUTANEOUS
  Administered 2023-07-27: 1 [IU] via SUBCUTANEOUS
  Administered 2023-07-28: 2 [IU] via SUBCUTANEOUS
  Administered 2023-07-28: 5 [IU] via SUBCUTANEOUS
  Administered 2023-07-29 – 2023-07-30 (×3): 1 [IU] via SUBCUTANEOUS
  Administered 2023-07-30: 2 [IU] via SUBCUTANEOUS
  Administered 2023-07-31: 3 [IU] via SUBCUTANEOUS
  Administered 2023-07-31 (×2): 1 [IU] via SUBCUTANEOUS
  Administered 2023-08-01 (×2): 2 [IU] via SUBCUTANEOUS
  Administered 2023-08-02: 1 [IU] via SUBCUTANEOUS
  Administered 2023-08-02 – 2023-08-03 (×5): 2 [IU] via SUBCUTANEOUS

## 2023-07-14 MED ORDER — BISACODYL 5 MG PO TBEC
5.0000 mg | DELAYED_RELEASE_TABLET | Freq: Every day | ORAL | Status: DC | PRN
  Administered 2023-07-22 – 2023-08-03 (×4): 5 mg via ORAL
  Filled 2023-07-14 (×4): qty 1

## 2023-07-14 MED ORDER — SODIUM CHLORIDE 0.9% FLUSH
5.0000 mL | Freq: Three times a day (TID) | INTRAVENOUS | Status: DC
  Administered 2023-07-14 – 2023-08-04 (×57): 5 mL

## 2023-07-14 MED ORDER — POLYETHYLENE GLYCOL 3350 17 G PO PACK: 17.0000 g | PACK | Freq: Every day | ORAL | Status: DC | PRN

## 2023-07-14 MED ORDER — GUAIFENESIN-DM 100-10 MG/5ML PO SYRP: 10.0000 mL | ORAL_SOLUTION | Freq: Four times a day (QID) | ORAL | Status: DC | PRN

## 2023-07-14 MED ORDER — ENSURE ENLIVE PO LIQD
237.0000 mL | Freq: Three times a day (TID) | ORAL | Status: DC
  Administered 2023-07-14 – 2023-07-16 (×5): 237 mL via ORAL

## 2023-07-14 MED ORDER — INSULIN ASPART 100 UNIT/ML IJ SOLN
0.0000 [IU] | Freq: Every day | INTRAMUSCULAR | Status: DC
  Administered 2023-07-31 – 2023-08-03 (×2): 2 [IU] via SUBCUTANEOUS

## 2023-07-14 NOTE — Discharge Summary (Signed)
[facility]  Physician Discharge Summary   Patient ID: Hunter Sparks MRN: [id number] DOB/AGE: [birth year] 77 y.o.  Admit date: 07/02/2023 Discharge date:   07/14/2023  PCP: Benita Stabile, MD  DISCHARGE DIAGNOSES:  Acute cholecystitis   Sepsis with acute organ dysfunction without septic shock 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 He has had left hemiparesis since then. D/c'd to CIR Patient was at rehab where he complained of severe abdominal pain for 2 days.  WBC was elevated 22.5.  He was sent to the hospital for further workup.  Right upper quadrant ultrasound showed acute cholecystitis.  General surgery was consulted and recommended IR place drain.  Drain placed on 8/20.  Hospital stay complicated by delirium and poor PO intake-- may need cortrack.  Slow to improve.   HOSPITAL COURSE:   Acute cholecystitis Underwent placement of cholecystostomy drain per IR. Percutaneous cholecystostomy drain to remain in place at least 6 weeks. Recommend fluoroscopy with injection of the drain in IR to evaluate for patency of the cystic duct.  IR will follow up with the patient in approximately 6 weeks. A scheduler from their office will call the patient with a date/time of his appointment.  Patient to also follow-up with general surgery. -High risk for surgery given being on Plavix and recent stroke Cultures from the cholecystostomy drain growing Klebsiella and Enterococcus.  Patient was on ceftriaxone.  Changed over to Levaquin.   -Will need outpatient follow-up with general surgery to discuss timing of cholecystectomy if GOC do not change There was also concern for ileus based on imaging studies.  However this appears to have improved.  Abdomen is benign.  Diet was advanced which he appears to be tolerating well.   Sepsis due to acute cholecystitis -Presented with sepsis physiology due to acute cholecystitis, -Initial lactic acid 2.9; improved to 1.2 -sepsis picture resolved   Hemiparesis affecting left side as effect of CVA -Recent CVA causing left hemiparesis -? back to the inpatient rehab once medically stable -Continue Plavix, aspirin   Diabetes mellitus type 2 -SSI --hold insulin due to low BS from poor PO intake   CAD -Continue aspirin, Lopressor.  Statin currently on hold per family request.  Should be able to resume at discharge.   Hypothyroidism -Continue Synthroid   AKI -appears pre-renal -improving   Delirium -precautions -removed mittens   Hypokalemia Supplemented   Poor PO intake Initially there was concern for poor oral intake.  Cortrak was being considered.  However patient's appetite improved.  Once his ileus resolved he was able to tolerate his diet.  Severe protein calorie malnutrition Nutrition Problem: Severe Malnutrition Etiology: acute illness (recent CVA, sepsis, acute cholecystitis)  PERTINENT LABS:  The results of significant diagnostics from this hospitalization (including imaging, microbiology, ancillary and laboratory) are listed below for reference.     Labs:   Basic  Metabolic Panel: Recent Labs  Lab 07/08/23 0635 07/09/23 0554 07/10/23 0635 07/12/23 0330 07/14/23 0246  NA 138 137 135 136 132*  K 4.0 3.8 3.4* 4.0 4.0  CL 101 98 97* 101 97*  CO2 28 28 26 25 23   GLUCOSE 116* 130* 141* 144* 139*  BUN 29* 25* 17 19 16   CREATININE 1.46* 1.47* 1.33* 1.51* 1.35*  CALCIUM 8.3* 8.4* 8.3* 8.1* 8.4*  MG  --   --   --  1.7  --    Liver Function Tests: Recent Labs  Lab 07/08/23 0635 07/09/23 0554  AST 72* 52*  ALT 44 42  ALKPHOS 101 105  BILITOT 0.8 0.8  PROT 5.4* 5.5*  ALBUMIN 2.0* 2.1*   CBC: Recent Labs  Lab 07/08/23 0409 07/09/23 0554 07/10/23 0635 07/12/23 0330 07/14/23 0246  WBC 15.8* 14.8* 16.1* 12.4* 10.5  HGB 13.9 12.9* 13.3 11.5* 11.4*  HCT 42.0 38.8* 39.1 33.8* 33.8*  MCV 92.3 91.5 89.1 90.6 90.6  PLT 195 224 262 258 305     CBG: Recent Labs  Lab 07/13/23 1128 07/13/23 1452 07/13/23 1721 07/13/23 2124 07/14/23 0744  GLUCAP 182* 156* 192* 169* 139*     DISCHARGE EXAMINATION: Vitals:   07/13/23 1453 07/13/23 1756 07/13/23 2117 07/14/23 0700  BP: 132/85 (!) 122/90 (!) 144/68 (!) 147/63  Pulse: 78 78 83 77  Resp: 14 17 19 18   Temp: 97.7 F (36.5 C) 97.9 F (36.6 C) 98.2 F (36.8 C) 98.4 F (36.9 C)  TempSrc: Oral  Oral Oral  SpO2: 100% 95% 97% 96%  Weight:    86 kg   General appearance: Awake alert.  In no distress Resp: Clear to auscultation bilaterally.  Normal effort Cardio: S1-S2 is normal regular.  No S3-S4.  No rubs murmurs or bruit GI: Abdomen is soft.  Nontender nondistended.  Bowel sounds are present normal.  No masses organomegaly  DISPOSITION: CIR     Current Inpatient Medications: Scheduled:  acetaminophen  1,000 mg Oral TID   aspirin EC  81 mg Oral Q breakfast   clopidogrel  75 mg Oral Daily   feeding supplement  237 mL Oral TID BM   heparin  5,000 Units Subcutaneous Q8H   insulin aspart  0-5 Units Subcutaneous QHS   insulin aspart  0-9 Units Subcutaneous TID WC   levofloxacin  250  mg Oral Q24H   levothyroxine  75 mcg Oral Q0600   lidocaine  1 patch Transdermal Q24H   metoprolol tartrate  50 mg Oral BID   OLANZapine  5 mg Oral Daily   pantoprazole  40 mg Oral Daily   sodium chloride flush  5 mL Intracatheter Q8H   traMADol  50 mg Oral TID   traZODone  100 mg Oral QHS   Continuous:  sodium chloride 10 mL/hr at 07/10/23 1543   VZD:GLOVFI chloride, alum & mag hydroxide-simeth, bisacodyl, hydrALAZINE, ondansetron **OR** ondansetron (ZOFRAN) IV, mouth rinse, simethicone     Follow-up Information     Gilmer Mor, DO Follow up in 8 week(s).   Specialties: Interventional Radiology, Radiology Why: pt will hear from IR scheduler for outpt follow up; flush drain daily 5-10 cc sterile saline Contact information: [address] [place] [address] [phone]         [facility] Theotis Burrow  CT Imaging Follow up in 8 week(s).   Specialty: Radiology Why: pt will hear from scheduler for follow up date and timel; call [phone] if any questions.concerns Contact information: [address] [place] [address] [phone]        Axel Filler, MD. Go on 08/24/2023.   Specialty: General Surgery Why: Your appointment is 08/24/23 at 10am Arrive early to check in, fill out paperwork, Bring photo ID and insurance information Contact information: [address] [place] [address] [phone]                 TOTAL DISCHARGE TIME: 35 minutes  Zamorah Ailes Rito Ehrlich  [facility] Pager on [email or web address]  07/14/2023, 8:27 AM

## 2023-07-14 NOTE — Progress Notes (Signed)
Pt alert and oriented to self and occasionally time. Pt resting in bed and continues to remove gown and telemetry box. Redirected pt to place, situation, and time and encouraged pt to keep monitor attached as well as gown on. Pt has removed tele box a few times tonight and refuses to keep in place. Contact Hospitalist to inform of pt continuing to remove tele box. Aware of pt becoming agitated earlier today and wanting to avoid a similar interaction from continually putting something back on that he does not remember taking off and is resting when I return to do so. He has verbalized not wanting this box on and insist on being undressed in bed. Bed is in lowest position with bed alarm activated. Call bell in reach and so are belongings. Fall mat in place.

## 2023-07-15 ENCOUNTER — Inpatient Hospital Stay (HOSPITAL_COMMUNITY): Payer: PPO

## 2023-07-15 DIAGNOSIS — R609 Edema, unspecified: Secondary | ICD-10-CM | POA: Diagnosis not present

## 2023-07-15 DIAGNOSIS — I639 Cerebral infarction, unspecified: Secondary | ICD-10-CM | POA: Diagnosis not present

## 2023-07-15 LAB — COMPREHENSIVE METABOLIC PANEL
ALT: 21 U/L (ref 0–44)
AST: 21 U/L (ref 15–41)
Albumin: 2.4 g/dL — ABNORMAL LOW (ref 3.5–5.0)
Alkaline Phosphatase: 76 U/L (ref 38–126)
Anion gap: 8 (ref 5–15)
BUN: 19 mg/dL (ref 8–23)
CO2: 26 mmol/L (ref 22–32)
Calcium: 8.5 mg/dL — ABNORMAL LOW (ref 8.9–10.3)
Chloride: 104 mmol/L (ref 98–111)
Creatinine, Ser: 1.75 mg/dL — ABNORMAL HIGH (ref 0.61–1.24)
GFR, Estimated: 39 mL/min — ABNORMAL LOW (ref >60)
Glucose, Bld: 148 mg/dL — ABNORMAL HIGH (ref 70–99)
Potassium: 4.3 mmol/L (ref 3.5–5.1)
Sodium: 138 mmol/L (ref 135–145)
Total Bilirubin: 0.6 mg/dL (ref 0.3–1.2)
Total Protein: 5.7 g/dL — ABNORMAL LOW (ref 6.5–8.1)

## 2023-07-15 LAB — CBC WITH DIFFERENTIAL/PLATELET
Abs Immature Granulocytes: 0.06 10*3/uL (ref 0.00–0.07)
Basophils Absolute: 0 10*3/uL (ref 0.0–0.1)
Basophils Relative: 0 %
Eosinophils Absolute: 0.5 10*3/uL (ref 0.0–0.5)
Eosinophils Relative: 5 %
HCT: 38.2 % — ABNORMAL LOW (ref 39.0–52.0)
Hemoglobin: 12.5 g/dL — ABNORMAL LOW (ref 13.0–17.0)
Immature Granulocytes: 1 %
Lymphocytes Relative: 26 %
Lymphs Abs: 2.5 10*3/uL (ref 0.7–4.0)
MCH: 30.7 pg (ref 26.0–34.0)
MCHC: 32.7 g/dL (ref 30.0–36.0)
MCV: 93.9 fL (ref 80.0–100.0)
Monocytes Absolute: 1 10*3/uL (ref 0.1–1.0)
Monocytes Relative: 10 %
Neutro Abs: 5.8 10*3/uL (ref 1.7–7.7)
Neutrophils Relative %: 58 %
Platelets: 340 10*3/uL (ref 150–400)
RBC: 4.07 MIL/uL — ABNORMAL LOW (ref 4.22–5.81)
RDW: 13 % (ref 11.5–15.5)
WBC: 9.9 10*3/uL (ref 4.0–10.5)
nRBC: 0 % (ref 0.0–0.2)

## 2023-07-15 LAB — GLUCOSE, CAPILLARY
Glucose-Capillary: 121 mg/dL — ABNORMAL HIGH (ref 70–99)
Glucose-Capillary: 168 mg/dL — ABNORMAL HIGH (ref 70–99)
Glucose-Capillary: 139 mg/dL — ABNORMAL HIGH (ref 70–99)
Glucose-Capillary: 177 mg/dL — ABNORMAL HIGH (ref 70–99)

## 2023-07-15 NOTE — Progress Notes (Signed)
PROGRESS NOTE   Subjective/Complaints:  No acute complaints.  No events overnight. Labs this a.m. with AKI creatinine 1.75; otherwise unchanged.  Encouraging fluids. Vital stable. Last bowel movement overnight, small.  Incontinent of bowel and bladder.  ROS: Denies fevers, chills, N/V, abdominal pain, constipation, diarrhea, SOB, cough, chest pain, new weakness or paraesthesias.    Objective:   No results found. Recent Labs    07/14/23 0246 07/15/23 0655  WBC 10.5 9.9  HGB 11.4* 12.5*  HCT 33.8* 38.2*  PLT 305 340   Recent Labs    07/14/23 0246 07/15/23 0655  NA 132* 138  K 4.0 4.3  CL 97* 104  CO2 23 26  GLUCOSE 139* 148*  BUN 16 19  CREATININE 1.35* 1.75*  CALCIUM 8.4* 8.5*    Intake/Output Summary (Last 24 hours) at 07/15/2023 0946 Last data filed at 07/15/2023 0850 Gross per 24 hour  Intake 245 ml  Output 60 ml  Net 185 ml        Physical Exam: Vital Signs Blood pressure 126/86, pulse 77, temperature 98.3 F (36.8 C), resp. rate 16, height 6' (1.829 m), weight 84.3 kg, SpO2 97%.  Constitutional: No apparent distress. Appropriate appearance for age.  HENT: No JVD. Neck Supple. Trachea midline. Atraumatic, normocephalic. Eyes: PERRLA. EOMI. Visual fields grossly intact.  Cardiovascular: RRR, no murmurs/rub/gallops. No Edema. Peripheral pulses 2+  Respiratory: CTAB. No rales, rhonchi, or wheezing. On RA.  Abdomen: + bowel sounds, normoactive. No distention or tenderness. + Biliary drain with minimal dark green output. Skin: C/D/I. No apparent lesions.  -Biliary drain site under clean gauze, mild erythema around tube site but without active drainage or expressible material.  Nontender to palpation.   MSK:      No apparent deformity.       Neurologic exam:  Cognition: AAO to person, place, time and event.  Can follow simple commands, but difficulty with higher level cognitive tasks or  concentration. Language: Fluent, No substitutions or neoglisms.  Mild dysarthria. Names 3/3 objects correctly.  Memory: Recalls 2/3 objects at 5 minutes.  Insight: Poor overall insight into current condition.  Mood: Pleasant affect, appropriate mood.  Sensation: To light touch intact in BL UEs and LEs  Reflexes: 2+ in BL UE and LEs. Negative Hoffman's and babinski signs bilaterally.  CN: 2-12 grossly intact.  Coordination: No apparent tremors.  Bilateral upper extremity ataxia, left greater than right. Spasticity: MAS 0 in all extremities.  Strength: 5 out of 5 strength throughout bilateral upper extremities 5 out of 5 strength in right lower extremity 4+ out of 5 strength throughout left lower extremity   Assessment/Plan: 1. Functional deficits which require 3+ hours per day of interdisciplinary therapy in a comprehensive inpatient rehab setting. Physiatrist is providing close team supervision and 24 hour management of active medical problems listed below. Physiatrist and rehab team continue to assess barriers to discharge/monitor patient progress toward functional and medical goals  Care Tool:  Bathing              Bathing assist       Upper Body Dressing/Undressing Upper body dressing        Upper body assist  Lower Body Dressing/Undressing Lower body dressing            Lower body assist       Toileting Toileting    Toileting assist       Transfers Chair/bed transfer  Transfers assist           Locomotion Ambulation   Ambulation assist              Walk 10 feet activity   Assist           Walk 50 feet activity   Assist           Walk 150 feet activity   Assist           Walk 10 feet on uneven surface  activity   Assist           Wheelchair     Assist               Wheelchair 50 feet with 2 turns activity    Assist            Wheelchair 150 feet activity     Assist           Blood pressure 126/86, pulse 77, temperature 98.3 F (36.8 C), resp. rate 16, height 6' (1.829 m), weight 84.3 kg, SpO2 97%.  1. 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  Call with medical updates.   2.  Antithrombotics: -DVT/anticoagulation:  Pharmaceutical: Heparin             -antiplatelet therapy: Aspirin and Plavix for three weeks followed by aspirin alone>>last dose of Plavix 8/30.    3. Pain Management: chronic back pain -Tylenol as needed             -continue tramadol 50 mg TID   4. Mood/Behavior/Sleep/delirium: LCSW to evaluate and provide emotional support             -continue trazodone 100 mg q HS             -antipsychotic agents: continue Zyprexa 5 mg daily             -Last night, got as needed Haldol for attempts to get out of bed and severe agitation; much calmer and more appropriate today, in general has been doing better, will place on telemetry with Posey belt  9-1: No PRNs overnight.  Slept well.   5. Neuropsych/cognition: This patient is not quite capable of making decisions on his own behalf.   6. Skin/Wound Care: Routine skin care checks   7. Fluids/Electrolytes/Nutrition: Routine Is and Os and follow-up chemistries             -dys 3 with thin liquids             -SLP eval   8: Hypertension: monitor TID and prn (home Cozaar 25 mg daily held)             -continue metoprolol 50 mg BID   9: Hyperlipidemia: statin held (follow-up LFTs and restart atorvastatin 40  mg as appropriate)   10: Hypothyroidism: continue Synthroid   11: Acute cholecystitis s/p cholecystostomy tube             -routine drain care             -continue Levaquin  250 mg daily   12: DM-2: CBGs QID; A1c = 8.8%; on insulin at home             -continue SSI   13: Hx of CAD s/p CABG 2016: on Plavix, asa, BB; ? resume statin   14: CKD IIIa: baseline Cr ~1.3             -follow-up BMP   15: GI prophylaxis: continue Protonix   16.  Bowel and bladder incontinence.  Initiate PVRs.  LOS: 1 days A FACE TO FACE EVALUATION WAS PERFORMED  Hunter Sparks 07/15/2023, 9:46 AM

## 2023-07-15 NOTE — H&P (Addendum)
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 Placed on Plavix and aspirin for 3 weeks beginning 8/09. Echo  with ~70-75% EF and grade 1 diastolic dysfunction. Admitted to CIR on 8/10. On 07/02/2023 had findings of some cognitive changes with nausea vomiting right upper quadrant discomfort. Low-grade fever 99.1 WBC 16,100 lactic acid elevated 2.9 with follow-up WBC of 22,500. Transferred to acute TRH service. CT c/w acute cholecystitis. Antibiotics continue. General surgery and IR consulted for cholecystostomy drain and this was placed 8/20. Operative cultures positive for Klebsiella, patient treated with IV Rocephin. Hospital stay has been complicated by poor p.o. intakes, ongoing encephalopathy, type 2 diabetes, AKI on maintenance fluids, and abdominal and back pain on oxycodone and tramadol due to Dilaudid causing hypotension. Percutaneous cholecystostomy drain to remain in place at least 6 weeks. Recommend fluoroscopy with injection of the drain in IR to evaluate for patency of the cystic duct. IR will follow up with the patient in approximately 6 weeks. A scheduler from their office will call the patient with a date/time of his appointment. Palliative care consulted on 8/25 for goals of care. Follow-up with patient and spouse on 8/26 and patient has decided on DNR/DNI. Sepsis resolved. Statin on hold per family request per Dr. Benjamine Mola. CMP with persistent mild elevation of AST. There was consideration to placing Cortrak due to poor PO intake, however notes indicate he is eating better last 24 hours. Reviewing Is and Os, there is no PO intake documented back to 8/26. 8/29: Worked on sit>stand from recliner to RW, needed max A +2, esp on L side. After several  trials pt was more fatigued and could not progress to ambulation. The patient requires inpatient medicine and rehabilitation evaluations and services for ongoing dysfunction secondary to scattered small infarct right basal ganglia, frontal, parietal, occipital lobes-MCA/PCA watershed distribution.    He has a supportive spouse with 24/7 care giving available, lives in single level home with 3 steps to enter. Reports that he quit smoking about 19 years ago. His smoking use included cigarettes. He started smoking about 64 years ago. He has a 90 pack-year smoking history. His smokeless tobacco use includes chew. He reports that he does not drink alcohol and does not use drugs.   ROS: Denies fevers, chills, N/V, abdominal pain, constipation, diarrhea, SOB, cough, chest pain, new weakness or paraesthesias.  Otherwise, as per HPI above.      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HCC)     Arthritis      "back, hips, legs" (06/10/2015)   Chest pain     Chronic lower back pain     Depression     DJD (degenerative joint disease)     Hyperlipidemia 06/11/2015   Hypertension     Hypothyroidism     Kidney stone     Kidney stone     Type II diabetes mellitus (HCC)               Past Surgical History:  Procedure Laterality Date   CARDIAC CATHETERIZATION N/A 06/11/2015    Procedure: Left Heart Cath and Coronary Angiography;  Surgeon: Lyn Records, MD;  Location: [facility] INVASIVE CV LAB;  Service: Cardiovascular;  Laterality: N/A;   COLONOSCOPY  06/21/2011    Procedure: COLONOSCOPY;  Surgeon: Malissa Hippo, MD;  Location: [facility];  Service: Endoscopy;  Laterality: N/A;   COLONOSCOPY       COLONOSCOPY N/A 09/20/2017    Procedure: COLONOSCOPY;  Surgeon: Malissa Hippo, MD;  Location: [facility];  Service: Endoscopy;  Laterality: N/A;  1200   CORONARY ARTERY BYPASS GRAFT N/A 06/14/2015    Procedure: CORONARY ARTERY BYPASS GRAFTING times four using Left Internal mammary artery  and right leg Saphenous vein graft;  Surgeon: Kerin Perna, MD;  Location: [facility] OR;  Service: Open Heart Surgery;  Laterality: N/A;   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   HAND SURGERY       IR PERC CHOLECYSTOSTOMY   07/03/2023   SKIN CANCER EXCISION Left      "forearm"   TEE WITHOUT CARDIOVERSION N/A 06/14/2015    Procedure: TRANSESOPHAGEAL ECHOCARDIOGRAM (TEE);  Surgeon: Kerin Perna, MD;  Location: [facility] OR;  Service: Open Heart Surgery;  Laterality: N/A;             Family History  Problem Relation Age of Onset   Stroke Mother     Hypertension Mother     Stomach cancer Father     Liver cancer Father     Cancer Father     Rectal cancer Brother     Cancer Brother     Heart attack Neg Hx          Social History:  reports that he quit smoking about 19 years ago. His smoking use included cigarettes. He started smoking about 64 years ago. He has a 90 pack-year smoking history. His smokeless tobacco use includes chew. He reports that he does not drink alcohol and does not use drugs. Allergies:  Allergies       Allergies  Allergen Reactions   Penicillins Rash                     Medications Prior to Admission  Medication Sig Dispense Refill   acetaminophen (TYLENOL) 325 MG tablet Take 2 tablets (650 mg total) by mouth every 4 (four) hours as needed for mild pain (or temp > 37.5 C (99.5 F)).       aspirin EC 81 MG tablet Take 1 tablet (81 mg total) by mouth daily with breakfast. -Take Aspirin 81 mg daily along with Plavix 75 mg daily for 21 days then after that STOP the Plavix  and continue ONLY Aspirin 81 mg daily indefinitely-- 30 tablet 11   atorvastatin (LIPITOR) 40 MG tablet Take 1 tablet (40 mg total) by mouth daily. 30 tablet 4   blood glucose meter kit and supplies 1 each by Other route 4 (four) times daily. Dispense based on patient and insurance preference. Use up to four times daily as directed. (FOR ICD-10 E10.9, E11.9). 1 each 0   clopidogrel (PLAVIX) 75 MG  tablet Take 1 tablet (75 mg total) by mouth daily. Take Aspirin 81 mg daily along with Plavix 75 mg daily for 21 days then after that STOP the Plavix  and continue ONLY Aspirin 81 mg daily indefinitely-- 30 tablet 0   glucose blood (ONETOUCH VERIO) test strip 1 each by Other route 4 (four) times daily. Use as instructed 150 each 5   insulin aspart (NOVOLOG) 100 UNIT/ML FlexPen Inject 0-10 Units into the skin 3 (three) times daily with meals. Short acting insulin per sliding scale 0-10 ---:-- Insulin injection 0-10 Units 0-10 Units Subcutaneous,  3 times daily with meals CBG 70 - 120: 0 unit CBG 121 - 150: 0 unit  CBG 151 - 200: 1 unit CBG 201 - 250: 2 units CBG 251 - 300: 4 units CBG 301 - 350: 6 units  CBG 351 - 400: 8 units  CBG > 400: 10 units 15 mL 11   insulin glargine, 2 Unit Dial, (TOUJEO MAX SOLOSTAR) 300 UNIT/ML Solostar Pen Inject 42 Units into the skin at bedtime. 15 mL 0   Insulin Pen Needle (B-D ULTRAFINE III SHORT PEN) 31G X 8 MM MISC 1 each by Does not apply route as directed. 100 each 3   levothyroxine (SYNTHROID) 75 MCG tablet Take 1 tablet (75 mcg total) by mouth daily before breakfast. 90 tablet 0   losartan (COZAAR) 25 MG tablet Take 1 tablet (25 mg total) by mouth daily. 90 tablet 0   metoprolol tartrate (LOPRESSOR) 25 MG tablet Take 1 tablet (25 mg total) by mouth 2 (two) times daily. 180 tablet 0   senna-docusate (SENOKOT-S) 8.6-50 MG tablet Take 2 tablets by mouth at bedtime. 60 tablet 1   traMADol (ULTRAM) 50 MG tablet Take 1 tablet (50 mg total) by mouth every 6 (six) hours as needed. pain 30 tablet 0   traZODone (DESYREL) 100 MG tablet Take 1 tablet (100 mg total) by mouth at bedtime. 30 tablet 2              Home: Home Living Family/patient expects to be discharged to:: Private residence Living Arrangements: Spouse/significant other Available Help at Discharge: Family, Available 24 hours/day Type of Home: House Home Access: Stairs to enter [employer] of  Steps: 3 Entrance Stairs-Rails: Left Home Layout: One level Bathroom Shower/Tub: Engineer, manufacturing systems: Handicapped height Bathroom Accessibility: Yes Home Equipment: None  Lives With: Spouse   Functional History: Prior Function Prior Level of Function : Independent/Modified Independent, Driving Mobility Comments: Tourist information centre manager without AD ADLs Comments: Independent; drives   Functional Status:  Mobility: Bed Mobility Overal bed mobility: Needs Assistance Bed Mobility: Sit to Supine Rolling: Mod assist Sidelying to sit: Max assist, +2 for physical assistance Supine to sit: Mod assist, HOB elevated, +2 for physical assistance Sit to supine: Mod assist General bed mobility comments: when given extra time pt able to go down to R elbow and initiate LLE into bed but needed mod A to get it in fully. Was able to bring RLE into bed and scoot to L with min A. Transfers Overall transfer level: Needs assistance Equipment used: Rolling walker (2 wheels) Transfers: Sit to/from Stand, Bed to chair/wheelchair/BSC Sit to Stand: +2 safety/equipment, Max assist Bed to/from chair/wheelchair/BSC transfer type:: Stand pivot Stand pivot transfers: Max assist, +2 physical assistance Transfer via Lift Equipment: Stedy General transfer comment: Worked on mutliple sit>stands with RW then bed to chair transfers to R without AD.  Pt needed max A to get hips over arm of chair. Was able to use RUE to pull on bed rail to assist. Ambulation/Gait General Gait Details: to fatigued and lethargic to progress ambulation today Gait velocity: slow   ADL: ADL Overall ADL's : Needs assistance/impaired Eating/Feeding: Moderate assistance, Sitting Eating/Feeding Details (indicate cue type and reason): drink from cup Grooming: Wash/dry hands, Wash/dry face, Set up, Sitting Upper Body Bathing: Maximal assistance, Sitting Upper Body Bathing Details (indicate cue type and reason): simulated Lower Body  Bathing: Maximal assistance, +2 for physical assistance, Sit to/from stand Lower Body Bathing Details (indicate cue type and reason): simulated Upper Body  Dressing : Minimal assistance, Sitting Lower Body Dressing: Total assistance Lower Body Dressing Details (indicate cue type and reason): gripper socks only Toilet Transfer: Moderate assistance, +2 for physical assistance, +2 for safety/equipment, BSC/3in1 Toilet Transfer Details (indicate cue type and reason): simulated with stedy Toileting- Clothing Manipulation and Hygiene: Total assistance, Sit to/from stand, Sitting/lateral lean Toileting - Clothing Manipulation Details (indicate cue type and reason): patient unable to complete peri care when standing in stedy Functional mobility during ADLs: Maximal assistance, +2 for safety/equipment (standing EOB) General ADL Comments: Patient session focus on pre-gait training in hallway with PT (refer to PT note) and functional mobility to work on ADL tolerance. Patient remains limited with regard to lower body ADLs and toileting, but improved command following and static balance in stedy and when using rail in hallway to address functional deficits. Patient remains at a mod-max A level for ADL management, and mod of 2 for transfers. OT continues to highly recommend intensive therapy services. OT will continue to follow.   Cognition: Cognition Overall Cognitive Status: Impaired/Different from baseline Orientation Level: Oriented to person, Oriented to place, Oriented to situation Cognition Arousal: Lethargic Behavior During Therapy: Flat affect Overall Cognitive Status: Impaired/Different from baseline Area of Impairment: Orientation, Attention, Memory, Following commands, Safety/judgement, Awareness, Problem solving Orientation Level: Disoriented to, Time, Situation Current Attention Level: Sustained, Selective Memory: Decreased short-term memory Following Commands: Follows one step commands with  increased time, Follows one step commands consistently, Follows multi-step commands inconsistently Safety/Judgement: Decreased awareness of safety, Decreased awareness of deficits Awareness: Intellectual Problem Solving: Slow processing, Decreased initiation, Difficulty sequencing, Requires verbal cues, Requires tactile cues General Comments: confusion getting better but quite lethargic today   Physical Exam: Blood pressure (!) 174/78, pulse 83, temperature 98 F (36.7 C), temperature source Oral, resp. rate 14, weight 88.8 kg, SpO2 96%. Physical Exam Constitutional: No apparent distress. Appropriate appearance for age.  HENT: No JVD. Neck Supple. Trachea midline. Atraumatic, normocephalic. Eyes: PERRLA. EOMI. Visual fields grossly intact.  Cardiovascular: RRR, no murmurs/rub/gallops. No Edema. Peripheral pulses 2+  Respiratory: CTAB. No rales, rhonchi, or wheezing. On RA.  Abdomen: + bowel sounds, normoactive. No distention or tenderness. + Biliary drain with minimal dark green output. Skin: C/D/I. No apparent lesions.  -Biliary drain site under clean gauze, mild erythema around tube site but without active drainage or expressible material.  Nontender to palpation.  MSK:      No apparent deformity.       Neurologic exam:  Cognition: AAO to person, place, time and event.  Can follow simple commands, but difficulty with higher level cognitive tasks or concentration. Language: Fluent, No substitutions or neoglisms.  Mild dysarthria. Names 3/3 objects correctly.  Memory: Recalls 2/3 objects at 5 minutes.  Insight: Poor overall insight into current condition.  Mood: Pleasant affect, appropriate mood.  Sensation: To light touch intact in BL UEs and LEs  Reflexes: 2+ in BL UE and LEs. Negative Hoffman's and babinski signs bilaterally.  CN: 2-12 grossly intact.  Coordination: No apparent tremors.  Bilateral upper extremity ataxia, left greater than right. Spasticity: MAS 0 in all  extremities.  Strength: 5 out of 5 strength throughout bilateral upper extremities 5 out of 5 strength in right lower extremity 4+ out of 5 strength throughout left lower extremity    Lab Results Last 48 Hours        Results for orders placed or performed during the hospital encounter of 07/02/23 (from the past 48 hour(s))  Glucose, capillary  Status: Abnormal    Collection Time: 07/11/23 11:11 AM  Result Value Ref Range    Glucose-Capillary 262 (H) 70 - 99 mg/dL      Comment: Glucose reference range applies only to samples taken after fasting for at least 8 hours.  Glucose, capillary     Status: Abnormal    Collection Time: 07/11/23  2:58 PM  Result Value Ref Range    Glucose-Capillary 173 (H) 70 - 99 mg/dL      Comment: Glucose reference range applies only to samples taken after fasting for at least 8 hours.  Glucose, capillary     Status: Abnormal    Collection Time: 07/11/23  9:10 PM  Result Value Ref Range    Glucose-Capillary 242 (H) 70 - 99 mg/dL      Comment: Glucose reference range applies only to samples taken after fasting for at least 8 hours.  CBC     Status: Abnormal    Collection Time: 07/12/23  3:30 AM  Result Value Ref Range    WBC 12.4 (H) 4.0 - 10.5 K/uL    RBC 3.73 (L) 4.22 - 5.81 MIL/uL    Hemoglobin 11.5 (L) 13.0 - 17.0 g/dL    HCT 19.1 (L) 47.8 - 52.0 %    MCV 90.6 80.0 - 100.0 fL    MCH 30.8 26.0 - 34.0 pg    MCHC 34.0 30.0 - 36.0 g/dL    RDW 29.5 62.1 - 30.8 %    Platelets 258 150 - 400 K/uL    nRBC 0.0 0.0 - 0.2 %      Comment: Performed at [facility], [address], [place], [place] [address]  Basic metabolic panel     Status: Abnormal    Collection Time: 07/12/23  3:30 AM  Result Value Ref Range    Sodium 136 135 - 145 mmol/L    Potassium 4.0 3.5 - 5.1 mmol/L    Chloride 101 98 - 111 mmol/L    CO2 25 22 - 32 mmol/L    Glucose, Bld 144 (H) 70 - 99 mg/dL      Comment: Glucose reference range applies only to samples taken after  fasting for at least 8 hours.    BUN 19 8 - 23 mg/dL    Creatinine, Ser 6.96 (H) 0.61 - 1.24 mg/dL    Calcium 8.1 (L) 8.9 - 10.3 mg/dL    GFR, Estimated 47 (L) >60 mL/min      Comment: (NOTE) Calculated using the CKD-EPI Creatinine Equation (2021)      Anion gap 10 5 - 15      Comment: Performed at [facility], [address], [place], [place] [address]  Magnesium     Status: None    Collection Time: 07/12/23  3:30 AM  Result Value Ref Range    Magnesium 1.7 1.7 - 2.4 mg/dL      Comment: Performed at [facility], [address], [place], [place] [address]  Glucose, capillary     Status: Abnormal    Collection Time: 07/12/23  7:25 AM  Result Value Ref Range    Glucose-Capillary 150 (H) 70 - 99 mg/dL      Comment: Glucose reference range applies only to samples taken after fasting for at least 8 hours.  Glucose, capillary     Status: Abnormal    Collection Time: 07/12/23 11:13 AM  Result Value Ref Range    Glucose-Capillary 220 (H) 70 - 99  mg/dL      Comment: Glucose reference range applies only to samples taken after fasting for at least 8 hours.  Glucose, capillary     Status: Abnormal    Collection Time: 07/12/23  3:00 PM  Result Value Ref Range    Glucose-Capillary 185 (H) 70 - 99 mg/dL      Comment: Glucose reference range applies only to samples taken after fasting for at least 8 hours.  Glucose, capillary     Status: Abnormal    Collection Time: 07/12/23  9:00 PM  Result Value Ref Range    Glucose-Capillary 141 (H) 70 - 99 mg/dL      Comment: Glucose reference range applies only to samples taken after fasting for at least 8 hours.  Glucose, capillary     Status: Abnormal    Collection Time: 07/13/23  7:57 AM  Result Value Ref Range    Glucose-Capillary 147 (H) 70 - 99 mg/dL      Comment: Glucose reference range applies only to samples taken after fasting for at least 8 hours.      Imaging Results (Last 48 hours)  No results found.         Blood  pressure (!) 174/78, pulse 83, temperature 98 F (36.7 C), temperature source Oral, resp. rate 14, weight 88.8 kg, SpO2 96%.   Medical Problem List and Plan: 1. 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  Call with medical updates.   2.  Antithrombotics: -DVT/anticoagulation:  Pharmaceutical: Heparin             -antiplatelet therapy: Aspirin and Plavix for three weeks followed by aspirin alone>>last dose of Plavix 8/30.    3. Pain Management: chronic back pain -Tylenol as needed             -continue tramadol 50 mg TID   4. Mood/Behavior/Sleep/delirium: LCSW to evaluate and provide emotional support             -continue trazodone 100 mg q HS             -antipsychotic agents: continue Zyprexa 5 mg daily  -Last night, got as needed Haldol for attempts to get out of bed and severe agitation; much calmer and more appropriate today, in general has been doing better, will place on telemetry with Posey belt   5. Neuropsych/cognition: This patient is not quite capable of making decisions on his own behalf.   6. Skin/Wound Care: Routine skin care checks   7. Fluids/Electrolytes/Nutrition: Routine Is and Os and follow-up chemistries             -dys 3 with thin liquids             -SLP eval   8: Hypertension: monitor TID and prn (home Cozaar 25 mg daily held)             -continue metoprolol 50 mg BID   9: Hyperlipidemia: statin held (follow-up LFTs and restart atorvastatin 40 mg as appropriate)   10: Hypothyroidism: continue Synthroid   11: Acute cholecystitis s/p cholecystostomy tube             -  routine drain care             -continue Levaquin 250 mg daily   12: DM-2: CBGs QID; A1c =  8.8%; on insulin at home             -continue SSI   13: Hx of CAD s/p CABG 2016: on Plavix, asa, BB; ? resume statin   14: CKD IIIa: baseline Cr ~1.3             -follow-up BMP   15: GI prophylaxis: continue Protonix   Milinda Antis, PA-C 07/13/2023  I have examined the patient independently and edited the note for HPI, ROS, exam, assessment, and plan as appropriate. I am in agreement with the above recommendations.   [name], DO 07/15/2023

## 2023-07-15 NOTE — Plan of Care (Signed)
Problem: RH Balance Goal: LTG: Patient will maintain dynamic sitting balance (OT) Description: LTG:  Patient will maintain dynamic sitting balance with assistance during activities of daily living (OT) Flowsheets (Taken 07/15/2023 1717) LTG: Pt will maintain dynamic sitting balance during ADLs with: Supervision/Verbal cueing Goal: LTG Patient will maintain dynamic standing with ADLs (OT) Description: LTG:  Patient will maintain dynamic standing balance with assist during activities of daily living (OT)  Flowsheets (Taken 07/15/2023 1717) LTG: Pt will maintain dynamic standing balance during ADLs with: Minimal Assistance - Patient > 75%   Problem: Sit to Stand Goal: LTG:  Patient will perform sit to stand in prep for activites of daily living with assistance level (OT) Description: LTG:  Patient will perform sit to stand in prep for activites of daily living with assistance level (OT) Flowsheets (Taken 07/15/2023 1717) LTG: PT will perform sit to stand in prep for activites of daily living with assistance level: Contact Guard/Touching assist   Problem: RH Grooming Goal: LTG Patient will perform grooming w/assist,cues/equip (OT) Description: LTG: Patient will perform grooming with assist, with/without cues using equipment (OT) Flowsheets (Taken 07/15/2023 1717) LTG: Pt will perform grooming with assistance level of: Supervision/Verbal cueing   Problem: RH Bathing Goal: LTG Patient will bathe all body parts with assist levels (OT) Description: LTG: Patient will bathe all body parts with assist levels (OT) Flowsheets (Taken 07/15/2023 1717) LTG: Pt will perform bathing with assistance level/cueing: Minimal Assistance - Patient > 75%   Problem: RH Dressing Goal: LTG Patient will perform upper body dressing (OT) Description: LTG Patient will perform upper body dressing with assist, with/without cues (OT). Flowsheets (Taken 07/15/2023 1717) LTG: Pt will perform upper body dressing with assistance  level of: Supervision/Verbal cueing Goal: LTG Patient will perform lower body dressing w/assist (OT) Description: LTG: Patient will perform lower body dressing with assist, with/without cues in positioning using equipment (OT) Flowsheets (Taken 07/15/2023 1717) LTG: Pt will perform lower body dressing with assistance level of: Minimal Assistance - Patient > 75%   Problem: RH Toileting Goal: LTG Patient will perform toileting task (3/3 steps) with assistance level (OT) Description: LTG: Patient will perform toileting task (3/3 steps) with assistance level (OT)  Flowsheets (Taken 07/15/2023 1717) LTG: Pt will perform toileting task (3/3 steps) with assistance level: Minimal Assistance - Patient > 75%   Problem: RH Toilet Transfers Goal: LTG Patient will perform toilet transfers w/assist (OT) Description: LTG: Patient will perform toilet transfers with assist, with/without cues using equipment (OT) Flowsheets (Taken 07/15/2023 1717) LTG: Pt will perform toilet transfers with assistance level of: Contact Guard/Touching assist   Problem: RH Tub/Shower Transfers Goal: LTG Patient will perform tub/shower transfers w/assist (OT) Description: LTG: Patient will perform tub/shower transfers with assist, with/without cues using equipment (OT) Flowsheets (Taken 07/15/2023 1717) LTG: Pt will perform tub/shower stall transfers with assistance level of: Minimal Assistance - Patient > 75%   Problem: RH Memory Goal: LTG Patient will demonstrate ability for day to day recall/carry over during activities of daily living with assistance level (OT) Description: LTG:  Patient will demonstrate ability for day to day recall/carry over during activities of daily living with assistance level (OT). Flowsheets (Taken 07/15/2023 1717) LTG:  Patient will demonstrate ability for day to day recall/carry over during activities of daily living with assistance level (OT): Minimal Assistance - Patient > 75%   Problem: RH  Attention Goal: LTG Patient will demonstrate this level of attention during functional activites (OT) Description: LTG:  Patient will demonstrate this level of  attention during functional activites  (OT) Flowsheets (Taken 07/15/2023 1717) Patient will demonstrate this level of attention during functional activites: Selective Patient will demonstrate above attention level in the following environment: Home LTG: Patient will demonstrate this level of attention during functional activites (OT): Minimal Assistance - Patient > 75%   Problem: RH Awareness Goal: LTG: Patient will demonstrate awareness during functional activites type of (OT) Description: LTG: Patient will demonstrate awareness during functional activites type of (OT) Flowsheets (Taken 07/15/2023 1717) Patient will demonstrate awareness during functional activites type of: Emergent LTG: Patient will demonstrate awareness during functional activites type of (OT): Minimal Assistance - Patient > 75%

## 2023-07-15 NOTE — Progress Notes (Incomplete Revision)
PROGRESS NOTE   Subjective/Complaints:  No acute complaints.  No events overnight. Labs this a.m. with AKI creatinine 1.75; otherwise unchanged.  Encouraging fluids. Vital stable. Last bowel movement overnight, small.  Incontinent of bowel and bladder.  ROS: Denies fevers, chills, N/V, abdominal pain, constipation, diarrhea, SOB, cough, chest pain, new weakness or paraesthesias.    Objective:   No results found. Recent Labs    07/14/23 0246 07/15/23 0655  WBC 10.5 9.9  HGB 11.4* 12.5*  HCT 33.8* 38.2*  PLT 305 340   Recent Labs    07/14/23 0246 07/15/23 0655  NA 132* 138  K 4.0 4.3  CL 97* 104  CO2 23 26  GLUCOSE 139* 148*  BUN 16 19  CREATININE 1.35* 1.75*  CALCIUM 8.4* 8.5*    Intake/Output Summary (Last 24 hours) at 07/15/2023 0946 Last data filed at 07/15/2023 0850 Gross per 24 hour  Intake 245 ml  Output 60 ml  Net 185 ml        Physical Exam: Vital Signs Blood pressure 126/86, pulse 77, temperature 98.3 F (36.8 C), resp. rate 16, height 6' (1.829 m), weight 84.3 kg, SpO2 97%.  Constitutional: No apparent distress. Appropriate appearance for age.  HENT: No JVD. Neck Supple. Trachea midline. Atraumatic, normocephalic. Eyes: PERRLA. EOMI. Visual fields grossly intact.  Cardiovascular: RRR, no murmurs/rub/gallops. No Edema. Peripheral pulses 2+  Respiratory: CTAB. No rales, rhonchi, or wheezing. On RA.  Abdomen: + bowel sounds, normoactive. No distention or tenderness. + Biliary drain with minimal dark green output. Skin: C/D/I. No apparent lesions.  -Biliary drain site under clean gauze, mild erythema around tube site but without active drainage or expressible material.  Nontender to palpation.   MSK:      No apparent deformity.       Neurologic exam:  Cognition: AAO to person, place, time and event.  Can follow simple commands, but difficulty with higher level cognitive tasks or  concentration. Language: Fluent, No substitutions or neoglisms.  Mild dysarthria. Names 3/3 objects correctly.  Memory: Recalls 2/3 objects at 5 minutes.  Insight: Poor overall insight into current condition.  Mood: Pleasant affect, appropriate mood.  Sensation: To light touch intact in BL UEs and LEs  Reflexes: 2+ in BL UE and LEs. Negative Hoffman's and babinski signs bilaterally.  CN: 2-12 grossly intact.  Coordination: No apparent tremors.  Bilateral upper extremity ataxia, left greater than right. Spasticity: MAS 0 in all extremities.  Strength: 5 out of 5 strength throughout bilateral upper extremities 5 out of 5 strength in right lower extremity 4+ out of 5 strength throughout left lower extremity   Assessment/Plan: 1. Functional deficits which require 3+ hours per day of interdisciplinary therapy in a comprehensive inpatient rehab setting. Physiatrist is providing close team supervision and 24 hour management of active medical problems listed below. Physiatrist and rehab team continue to assess barriers to discharge/monitor patient progress toward functional and medical goals  Care Tool:  Bathing              Bathing assist       Upper Body Dressing/Undressing Upper body dressing        Upper body assist  Lower Body Dressing/Undressing Lower body dressing            Lower body assist       Toileting Toileting    Toileting assist       Transfers Chair/bed transfer  Transfers assist           Locomotion Ambulation   Ambulation assist              Walk 10 feet activity   Assist           Walk 50 feet activity   Assist           Walk 150 feet activity   Assist           Walk 10 feet on uneven surface  activity   Assist           Wheelchair     Assist               Wheelchair 50 feet with 2 turns activity    Assist            Wheelchair 150 feet activity     Assist           Blood pressure 126/86, pulse 77, temperature 98.3 F (36.8 C), resp. rate 16, height 6' (1.829 m), weight 84.3 kg, SpO2 97%.  1. 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  Call with medical updates.   2.  Antithrombotics: -DVT/anticoagulation:  Pharmaceutical: Heparin             -antiplatelet therapy: Aspirin and Plavix for three weeks followed by aspirin alone>>last dose of Plavix 8/30.    3. Pain Management: chronic back pain -Tylenol as needed             -continue tramadol 50 mg TID   4. Mood/Behavior/Sleep/delirium: LCSW to evaluate and provide emotional support             -continue trazodone 100 mg q HS             -antipsychotic agents: continue Zyprexa 5 mg daily             -Last night, got as needed Haldol for attempts to get out of bed and severe agitation; much calmer and more appropriate today, in general has been doing better, will place on telemetry with Posey belt  9-1: No PRNs overnight.  Slept well.   5. Neuropsych/cognition: This patient is not quite capable of making decisions on his own behalf.   6. Skin/Wound Care: Routine skin care checks   7. Fluids/Electrolytes/Nutrition: Routine Is and Os and follow-up chemistries             -dys 3 with thin liquids             -SLP eval   8: Hypertension: monitor TID and prn (home Cozaar 25 mg daily held)             -continue metoprolol 50 mg BID   -Blood pressure well-controlled.    07/15/2023  5:09 AM 07/14/2023    7:36 PM 07/14/2023    5:00 PM  Vitals with BMI  Height   6\' 0"   Weight   185 lbs 14 oz  BMI   25.2  Systolic 126 116 161  Diastolic 86 60 53  Pulse 77 64 74    9:  Hyperlipidemia: statin held (follow-up LFTs and restart atorvastatin 40 mg as appropriate)   10: Hypothyroidism: continue Synthroid   11: Acute cholecystitis s/p cholecystostomy tube             -routine drain care             -continue Levaquin 250 mg daily   12: DM-2: CBGs QID; A1c = 8.8%; on insulin at home             -continue SSI   13: Hx of CAD s/p CABG 2016: on Plavix, asa, BB; ? resume statin   14: CKD IIIa: baseline Cr ~1.3             -follow-up BMP  -AKI creatinine 1.7 this a.m., encourage p.o. fluids, repeat in a.m.   15: GI prophylaxis: continue Protonix   16.  Bowel and bladder incontinence.  Initiate PVRs.  LOS: 1 days A FACE TO FACE EVALUATION WAS PERFORMED  [name] 07/15/2023, 9:46 AM

## 2023-07-15 NOTE — Evaluation (Signed)
Occupational Therapy Assessment and Plan  Patient Details  Name: Hunter Sparks MRN: [id number] Date of Birth: 07-15-1946  OT Diagnosis: abnormal posture, altered mental status, cognitive deficits, disturbance of vision, hemiplegia affecting non-dominant side, and muscle weakness (generalized) Rehab Potential: Rehab Potential (ACUTE ONLY): Good ELOS: 3 weeks   Today's Date: 07/15/2023 OT Individual Time: 9629-5284 OT Individual Time Calculation (min): 75 min     Hospital Problem: Principal Problem:   Acute CVA (cerebrovascular accident) [facility])   Past Medical History:  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HCC)    Arthritis    "back, hips, legs" (06/10/2015)   Chest pain    Chronic lower back pain    Depression    DJD (degenerative joint disease)    Hyperlipidemia 06/11/2015   Hypertension    Hypothyroidism    Kidney stone    Kidney stone    Type II diabetes mellitus (HCC)    Past Surgical History:  Past Surgical History:  Procedure Laterality Date   CARDIAC CATHETERIZATION N/A 06/11/2015   Procedure: Left Heart Cath and Coronary Angiography;  Surgeon: Lyn Records, MD;  Location: [facility] INVASIVE CV LAB;  Service: Cardiovascular;  Laterality: N/A;   COLONOSCOPY  06/21/2011   Procedure: COLONOSCOPY;  Surgeon: Malissa Hippo, MD;  Location: [facility];  Service: Endoscopy;  Laterality: N/A;   COLONOSCOPY     COLONOSCOPY N/A 09/20/2017   Procedure: COLONOSCOPY;  Surgeon: Malissa Hippo, MD;  Location: [facility];  Service: Endoscopy;  Laterality: N/A;  1200   CORONARY ARTERY BYPASS GRAFT N/A 06/14/2015   Procedure: CORONARY ARTERY BYPASS GRAFTING times four using Left Internal mammary artery and right leg Saphenous vein graft;  Surgeon: Kerin Perna, MD;  Location: [facility] OR;  Service: Open Heart Surgery;  Laterality: N/A;   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   HAND SURGERY     IR PERC CHOLECYSTOSTOMY  07/03/2023   SKIN CANCER EXCISION Left     "forearm"   TEE WITHOUT CARDIOVERSION N/A 06/14/2015   Procedure: TRANSESOPHAGEAL ECHOCARDIOGRAM (TEE);  Surgeon: Kerin Perna, MD;  Location: [facility] OR;  Service: Open Heart Surgery;  Laterality: N/A;    Assessment & Plan Clinical Impression: Hunter Sparks is a 77 year old male who initially presented on 06/21/2023 with left-sided weakness and imaging c/w with 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  Admitted to CIR on 8/10. On 07/02/2023 had findings of some cognitive changes with nausea vomiting right upper quadrant discomfort. Transferred to acute TRH service. CT c/w acute cholecystitis. General surgery and IR consulted for cholecystostomy drain and this was placed 8/20. Operative cultures positive for Klebsiella, patient treated with IV Rocephin. Hospital stay has been complicated by poor p.o. intakes, ongoing encephalopathy, type 2 diabetes, AKI on maintenance fluids, and abdominal and back pain on oxycodone and tramadol due to Dilaudid causing hypotension. Percutaneous cholecystostomy drain to remain in place at least 6 weeks.   Palliative care consulted on 8/25 for goals of care. Follow-up with patient and spouse on 8/26 and patient has decided on DNR/DNI. The patient requires inpatient medicine and rehabilitation evaluations and services for ongoing dysfunction secondary to scattered small infarct right basal ganglia, frontal, parietal, occipital lobes-MCA/PCA watershed distribution. Patient transferred to CIR on 07/14/2023 .    Patient currently requires max with basic self-care skills secondary to muscle weakness and muscle joint tightness, unbalanced muscle activation, decreased coordination, and decreased motor planning, decreased visual motor skills, decreased attention to  left and decreased motor planning, decreased initiation, decreased attention, decreased awareness, decreased problem solving, decreased safety awareness, decreased memory, and delayed processing, and decreased sitting  balance, decreased standing balance, decreased postural control, hemiplegia, and decreased balance strategies.  Prior to hospitalization, patient could complete BADL with independent .  Patient will benefit from skilled intervention to decrease level of assist with basic self-care skills and increase independence with basic self-care skills prior to discharge home with care partner.  Anticipate patient will require minimal physical assistance and follow up home health and follow up outpatient.  OT - End of Session Activity Tolerance: Decreased this session Endurance Deficit: Yes Endurance Deficit Description: lethargic at end of session - needing to return to bed OT Assessment Rehab Potential (ACUTE ONLY): Good OT Patient demonstrates impairments in the following area(s): Balance;Motor;Sensory;Cognition;Vision;Perception;Endurance;Safety OT Basic ADL's Functional Problem(s): Bathing;Dressing;Toileting OT Transfers Functional Problem(s): Toilet;Tub/Shower OT Plan OT Intensity: Minimum of 1-2 x/day, 45 to 90 minutes OT Frequency: 5 out of 7 days OT Duration/Estimated Length of Stay: 3 weeks OT Treatment/Interventions: Warden/ranger;Therapeutic Exercise;UE/LE Coordination activities;Self Care/advanced ADL retraining;UE/LE Strength taining/ROM;Therapeutic Activities;Functional mobility training;Patient/family education;Neuromuscular re-education;Cognitive remediation/compensation;Discharge planning;DME/adaptive equipment instruction;Visual/perceptual remediation/compensation;Wheelchair propulsion/positioning OT Self Feeding Anticipated Outcome(s): supervision OT Basic Self-Care Anticipated Outcome(s): Min assist OT Toileting Anticipated Outcome(s): Min assist OT Bathroom Transfers Anticipated Outcome(s): Min assist OT Recommendation Recommendations for Other Services: Neuropsych consult Patient destination: Home Follow Up Recommendations: Home health OT;Outpatient OT Equipment  Recommended: To be determined   OT Evaluation Precautions/Restrictions  Precautions Precautions: Fall Precaution Comments: left hemiparesis, pusher Restrictions Weight Bearing Restrictions: No Other Position/Activity Restrictions: Rt sided percutaneous drain   Pain Pain Assessment Pain Score: 0-No pain Home Living/Prior Functioning Home Living Family/patient expects to be discharged to:: Private residence Living Arrangements: Spouse/significant other Available Help at Discharge: Family, Available 24 hours/day Type of Home: House Home Access: Stairs to enter Secretary/administrator of Steps: 3 Entrance Stairs-Rails: Left Home Layout: One level Bathroom Shower/Tub: Engineer, manufacturing systems: Handicapped height Bathroom Accessibility: Yes Additional Comments: per pt  Lives With: Spouse IADL History Occupation: Retired Type of Occupation: Engineer, drilling Prior Function Level of Independence: Independent with gait, Independent with basic ADLs, Independent with transfers, Independent with homemaking with ambulation  Able to Take Stairs?: Yes Driving: Yes Vocation: Retired Administrator, sports Baseline Vision/History: 1 Wears glasses Ability to See in Adequate Light: 0 Adequate Patient Visual Report: No change from baseline Vision Assessment?: Vision impaired- to be further tested in functional context Additional Comments: Limited superior eye movement Perception  Perception: Impaired Perception-Other Comments: Midline orientation Praxis Praxis: Impaired Praxis Impairment Details: Initiation;Motor planning Cognition Cognition Overall Cognitive Status: Impaired/Different from baseline Arousal/Alertness: Awake/alert Orientation Level: Person;Place;Situation Person: Oriented Place: Oriented Situation: Oriented Memory: Impaired Memory Impairment: Retrieval deficit;Storage deficit;Decreased short term memory Decreased Short Term Memory: Verbal complex;Functional  complex Attention: Sustained Sustained Attention: Impaired Sustained Attention Impairment: Functional basic Awareness: Impaired Awareness Impairment: Emergent impairment Problem Solving: Impaired Problem Solving Impairment: Functional complex;Functional basic Executive Function: Initiating;Self Monitoring;Self Correcting Initiating: Impaired Initiating Impairment: Functional basic Self Monitoring: Impaired Self Monitoring Impairment: Functional basic Self Correcting: Impaired Self Correcting Impairment: Functional basic Behaviors: Other (comment) (No agitated or unsafe behaviors noted during OT evaluation - may consider if behavioral problems exacerbate in the evening) Safety/Judgment: Impaired Brief Interview for Mental Status (BIMS) Repetition of Three Words (First Attempt): 3 Temporal Orientation: Year: Correct Temporal Orientation: Month: Missed by 6 days to 1 month (feb, march april?) Temporal Orientation: Day: Correct Recall: "Sock": Yes, no cue required Recall: "Blue": Yes,  after cueing ("a color") Recall: "Bed": No, could not recall BIMS Summary Score: 11 Sensation Sensation Light Touch: Impaired by gross assessment Hot/Cold: Appears Intact Proprioception: Impaired by gross assessment Stereognosis: Not tested Coordination Gross Motor Movements are Fluid and Coordinated: No Fine Motor Movements are Fluid and Coordinated: No Finger Nose Finger Test: impaired - undershoots, difficulty following instruction 9 Hole Peg Test: R- 49.48, L - 1.05.20       Box and Blocks R - 24  L -22 Motor  Motor Motor: Abnormal tone;Abnormal postural alignment and control;Hemiplegia;Motor impersistence Motor - Skilled Clinical Observations: L LE hemiplegia, Motor impersistence LLE  Trunk/Postural Assessment  Cervical Assessment Cervical Assessment: Exceptions to [facility] (Forward head) Thoracic Assessment Thoracic Assessment: Exceptions to [facility] (thoracic flex) Lumbar Assessment Lumbar  Assessment: Exceptions to [facility] (limited pelvic rotation - more posterior) Postural Control Postural Control: Deficits on evaluation Trunk Control: lateral trunk lean to L  Balance Balance Balance Assessed: Yes Static Sitting Balance Static Sitting - Balance Support: No upper extremity supported;Feet supported Static Sitting - Level of Assistance: 4: Min assist Dynamic Sitting Balance Dynamic Sitting - Balance Support: No upper extremity supported;Feet supported;During functional activity Dynamic Sitting - Level of Assistance: 4: Min assist Static Standing Balance Static Standing - Balance Support: During functional activity;Right upper extremity supported;Left upper extremity supported Static Standing - Level of Assistance: 3: Mod assist Extremity/Trunk Assessment RUE Assessment Passive Range of Motion (PROM) Comments: WFL Active Range of Motion (AROM) Comments: WFL - limited isolated finger control - longstanding (?Dupuytren's) General Strength Comments: 4/5 LUE Assessment LUE Assessment: Exceptions to [facility] Passive Range of Motion (PROM) Comments: WFL Active Range of Motion (AROM) Comments: Has ability to raise arm overhead, but shows proximal weakness - difficulty sustaining- limited isolated finger control - longstanding (?Dupuytren's) LUE Body System: Neuro Brunstrum levels for arm and hand: Arm;Hand Brunstrum level for arm: Stage V Relative Independence from Synergy Brunstrum level for hand: Stage VI Isolated joint movements  Care Tool Care Tool Self Care Eating Eating activity did not occur: Safety/medical concerns      Oral Care  Oral care, brush teeth, clean dentures activity did not occur: Refused      Bathing Bathing activity did not occur: Refused Body parts bathed by patient: Face Body parts bathed by helper: Right arm;Left arm;Chest;Abdomen;Front perineal area;Buttocks;Right upper leg;Left upper leg;Left lower leg   Assist Level: Total Assistance - Patient < 25%     Upper Body Dressing(including orthotics)   What is the patient wearing?: Button up shirt   Assist Level: Moderate Assistance - Patient 50 - 74%    Lower Body Dressing (excluding footwear)   What is the patient wearing?: Pants;Underwear/pull up Assist for lower body dressing: Maximal Assistance - Patient 25 - 49%    Putting on/Taking off footwear   What is the patient wearing?: Non-skid slipper socks Assist for footwear: Total Assistance - Patient < 25%       Care Tool Toileting Toileting activity Toileting Activity did not occur (Clothing management and hygiene only): N/A (no void or bm)       Care Tool Bed Mobility Roll left and right activity   Roll left and right assist level: Minimal Assistance - Patient > 75%    Sit to lying activity   Sit to lying assist level: Minimal Assistance - Patient > 75%    Lying to sitting on side of bed activity   Lying to sitting on side of bed assist level: the ability to move from lying on  the back to sitting on the side of the bed with no back support.: Moderate Assistance - Patient 50 - 74%     Care Tool Transfers Sit to stand transfer   Sit to stand assist level: Moderate Assistance - Patient 50 - 74%    Chair/bed transfer   Chair/bed transfer assist level: Moderate Assistance - Patient 50 - 74%     Toilet transfer Toilet transfer activity did not occur: Refused       Care Tool Cognition  Expression of Ideas and Wants Expression of Ideas and Wants: 3. Some difficulty - exhibits some difficulty with expressing needs and ideas (e.g, some words or finishing thoughts) or speech is not clear  Understanding Verbal and Non-Verbal Content Understanding Verbal and Non-Verbal Content: 3. Usually understands - understands most conversations, but misses some part/intent of message. Requires cues at times to understand   Memory/Recall Ability Memory/Recall Ability : That he or she is in a hospital/hospital unit   Refer to Care Plan for Long  Term Goals  SHORT TERM GOAL WEEK 1 OT Short Term Goal 1 (Week 1): Patient will don pull over or front opening shirt with min assist OT Short Term Goal 2 (Week 1): Patient will identify need to void, and trasnfer to commode with min assist OT Short Term Goal 3 (Week 1): Patient will don lower body clothing with mod assist OT Short Term Goal 4 (Week 1): Patient will stand and free one hand at a time to help manage pants up or down during toileting OT Short Term Goal 5 (Week 1): Patient will transfer sit to stand with min assist in preparation for transfer  Recommendations for other services: Neuropsych   Skilled Therapeutic Intervention:   Patient received fully dressed in bed - awake and alert.  Patient declined ADL - but agreeable to transfer to wheelchair and complete hygiene at sink.  Simulated BADL movements and sequences to ascertain level of movement impairment. Patient with leftward lean in sitting and unable to locate midline without physical assistance.  No unsafe behaviors noted this session. Patient assisted to shave and wash face.  Patient lacks attention to detail in these tasks - but receptive to cueing. No family present.  Transported to gym for more thorough investigation of UE functioning.  Mild hemiparesis noted in LUE.  Motor impersistence and general fatigue more evident in trunk and LLE.   Returned to room, and back to bed. Alarm engaged, telesitter in place, waist beld applied in bed.  Patient left with call bell and personal items in reach.   ADL ADL Eating: Unable to assess Grooming: Moderate assistance Where Assessed-Grooming: Sitting at sink Upper Body Bathing: Unable to assess Lower Body Bathing: Unable to assess Upper Body Dressing: Unable to assess Lower Body Dressing: Unable to assess Toileting: Unable to assess Toilet Transfer: Unable to assess Toilet Transfer Method: Stand pivot Tub/Shower Transfer: Unable to assess Mobility  Bed Mobility Bed Mobility:  Rolling Right;Rolling Left;Supine to Sit;Sit to Supine Rolling Right: Minimal Assistance - Patient > 75% Rolling Left: Minimal Assistance - Patient > 75% Supine to Sit: Moderate Assistance - Patient 50-74% Sit to Supine: Minimal Assistance - Patient > 75% Transfers Sit to Stand: Moderate Assistance - Patient 50-74% Stand to Sit: Moderate Assistance - Patient 50-74%   Discharge Criteria: Patient will be discharged from OT if patient refuses treatment 3 consecutive times without medical reason, if treatment goals not met, if there is a change in medical status, if patient makes no progress  towards goals or if patient is discharged from hospital.  The above assessment, treatment plan, treatment alternatives and goals were discussed and mutually agreed upon: by patient  Collier Salina 07/15/2023, 5:06 PM

## 2023-07-15 NOTE — Plan of Care (Signed)
  Problem: RH Balance Goal: LTG Patient will maintain dynamic sitting balance (PT) Description: LTG:  Patient will maintain dynamic sitting balance with assistance during mobility activities (PT) Flowsheets (Taken 07/15/2023 1249) LTG: Pt will maintain dynamic sitting balance during mobility activities with:: Contact Guard/Touching assist Goal: LTG Patient will maintain dynamic standing balance (PT) Description: LTG:  Patient will maintain dynamic standing balance with assistance during mobility activities (PT) Flowsheets (Taken 07/15/2023 1249) LTG: Pt will maintain dynamic standing balance during mobility activities with:: Contact Guard/Touching assist   Problem: Sit to Stand Goal: LTG:  Patient will perform sit to stand with assistance level (PT) Description: LTG:  Patient will perform sit to stand with assistance level (PT) Flowsheets (Taken 07/15/2023 1249) LTG: PT will perform sit to stand in preparation for functional mobility with assistance level: Contact Guard/Touching assist   Problem: RH Bed Mobility Goal: LTG Patient will perform bed mobility with assist (PT) Description: LTG: Patient will perform bed mobility with assistance, with/without cues (PT). Flowsheets (Taken 07/15/2023 1249) LTG: Pt will perform bed mobility with assistance level of: Contact Guard/Touching assist   Problem: RH Bed to Chair Transfers Goal: LTG Patient will perform bed/chair transfers w/assist (PT) Description: LTG: Patient will perform bed to chair transfers with assistance (PT). Flowsheets (Taken 07/15/2023 1249) LTG: Pt will perform Bed to Chair Transfers with assistance level: Contact Guard/Touching assist   Problem: RH Car Transfers Goal: LTG Patient will perform car transfers with assist (PT) Description: LTG: Patient will perform car transfers with assistance (PT). Flowsheets (Taken 07/15/2023 1249) LTG: Pt will perform car transfers with assist:: Minimal Assistance - Patient > 75%   Problem: RH  Ambulation Goal: LTG Patient will ambulate in controlled environment (PT) Description: LTG: Patient will ambulate in a controlled environment, # of feet with assistance (PT). Flowsheets (Taken 07/15/2023 1249) LTG: Pt will ambulate in controlled environ  assist needed:: Contact Guard/Touching assist LTG: Ambulation distance in controlled environment: 150 with LRAD Goal: LTG Patient will ambulate in home environment (PT) Description: LTG: Patient will ambulate in home environment, # of feet with assistance (PT). Flowsheets (Taken 07/15/2023 1249) LTG: Pt will ambulate in home environ  assist needed:: Contact Guard/Touching assist LTG: Ambulation distance in home environment: 75 feet   Problem: RH Stairs Goal: LTG Patient will ambulate up and down stairs w/assist (PT) Description: LTG: Patient will ambulate up and down # of stairs with assistance (PT) Flowsheets (Taken 07/15/2023 1249) LTG: Pt will ambulate up/down stairs assist needed:: Minimal Assistance - Patient > 75% LTG: Pt will  ambulate up and down number of stairs: 3 steps with LRAD per home set up

## 2023-07-15 NOTE — Progress Notes (Signed)
Inpatient Rehabilitation Admission Medication Review by a Pharmacist  A complete drug regimen review was completed for this patient to identify any potential clinically significant medication issues.  High Risk Drug Classes Is patient taking? Indication by Medication  Antipsychotic Yes Zyprexa - delirium  Anticoagulant Yes Heparin - DVT px  Antibiotic Yes Levaquin - cholecystitis  Opioid Yes Tramadol - pain  Antiplatelet Yes ASA- CVA   Hypoglycemics/insulin Yes SSI - DM  Vasoactive Medication Yes Lopressor - HTN  Chemotherapy No   Other Yes Synthroid - hypothyroidism Lidocaine patch - pain Protonix - GERD Trazodone - sleep Robaxin PRN- spasms     Type of Medication Issue Identified Description of Issue Recommendation(s)  Drug Interaction(s) (clinically significant)     Duplicate Therapy     Allergy     No Medication Administration End Date     Incorrect Dose     Additional Drug Therapy Needed  Statin on hold per family request d/t persistent AST elevation  LFTs 9/1 WNL, F/U to restart   Significant med changes from prior encounter (inform family/care partners about these prior to discharge).    Other       Clinically significant medication issues were identified that warrant physician communication and completion of prescribed/recommended actions by midnight of the next day:  No  Name of provider notified for urgent issues identified:   Provider Method of Notification:   Pharmacist comments:   Time spent performing this drug regimen review (minutes):  20   Jani Gravel, PharmD Clinical Pharmacist  07/15/2023 9:05 AM

## 2023-07-15 NOTE — Progress Notes (Signed)
[name], DO  Physician Physical Medicine and Rehabilitation   Consult Note    Signed   Date of Service: 07/06/2023  2:09 PM  Related encounter: Admission (Discharged) from 07/02/2023 in [facility]  SURGICAL   Signed     Expand All Collapse All  Show:Clear all [x] Written[x] Templated[x] Copied  Added by: [x] [name], DO  [] Hover for details          Physical Medicine and Rehabilitation Consult Reason for Consult: Evaluate appropriateness for Inpatient Rehab Referring Physician: Dr. Benjamine Mola       HPI: Hunter Sparks is a 77 y.o. male with PMHx of  has a past medical history of Angina, class II (HCC), Arthritis, Chest pain, Chronic lower back pain, Depression, DJD (degenerative joint disease), Hyperlipidemia (06/11/2015), Hypertension, Hypothyroidism, Kidney stone, Kidney stone, and Type II diabetes mellitus (HCC). Marland Kitchen admitted 06/21/2023 after being found with slurred speech and left-sided weakness. Patient had stroke workup MRI showed scattered small infarct right basal ganglia, frontal, parietal, occipital lobes-MCA/PCA watershed distribution. Patient was seen by neurology services and placed on low-dose aspirin and Plavix x 3 weeks then aspirin alone. Close monitoring of permissive hypertension. Therapy evaluations completed due to patient decreased functional mobility left-sided weakness was admitted for a comprehensive rehab program.  On 8/19, patient was transferred back to acute for delirium, sepsis, and abdominal pain with eventual cause determined to be acute cholecystitis.  MRI prior to transfer did not show any new diffusion deficits.  Since admission, general surgery was consulted and IR placed gallbladder drain on 8-20.  Felt patient was a high surgical risk given Plavix and recent stroke, and decided to defer cholecystectomy until patient was more stable.  Operative cultures positive for Klebsiella, patient treated with IV Rocephin.   Hospital stay has been complicated by poor p.o. intakes, ongoing encephalopathy, type 2 diabetes, AKI on maintenance fluids, and abdominal and back pain on oxycodone and tramadol due to Dilaudid causing hypotension.   On exam, patient sitting up in bedside chair, wife at bedside.  She states that the patient has remained very delirious and encephalopathic since rehab discharge, although he seems somewhat better today.  Did prior have mitts to avoid pulling on IVs, however these were removed this morning due to causing further agitation.  She endorses eventual plan is for patient to return home, she is available 24/7 and can provide supervision contact-guard assist.  States that her son is nearby and available for physical assistance intermittently, and is also building a ramp into their home, which has a first-floor set up.  She states they are motivated for patient to return home once medically stable and able to resume therapy at inpatient rehab.  Patient, unfortunately, remains encephalopathic, perseverative on the fact that he is dead.   Unable to obtain ROS due to patient cognitive status.       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HCC)     Arthritis      "back, hips, legs" (06/10/2015)   Chest pain     Chronic lower back pain     Depression     DJD (degenerative joint disease)     Hyperlipidemia 06/11/2015   Hypertension     Hypothyroidism     Kidney stone     Kidney stone     Type II diabetes mellitus (HCC)               Past Surgical History:  Procedure Laterality Date   CARDIAC CATHETERIZATION N/A 06/11/2015    Procedure: Left Heart Cath and Coronary Angiography;  Surgeon: Lyn Records, MD;  Location: [facility] INVASIVE CV LAB;  Service: Cardiovascular;  Laterality: N/A;   COLONOSCOPY   06/21/2011    Procedure: COLONOSCOPY;  Surgeon: Malissa Hippo, MD;  Location: [facility] ENDO SUITE;  Service: Endoscopy;  Laterality: N/A;   COLONOSCOPY       COLONOSCOPY N/A 09/20/2017     Procedure: COLONOSCOPY;  Surgeon: Malissa Hippo, MD;  Location: [facility] ENDO SUITE;  Service: Endoscopy;  Laterality: N/A;  1200   CORONARY ARTERY BYPASS GRAFT N/A 06/14/2015    Procedure: CORONARY ARTERY BYPASS GRAFTING times four using Left Internal mammary artery and right leg Saphenous vein graft;  Surgeon: Kerin Perna, MD;  Location: [facility] OR;  Service: Open Heart Surgery;  Laterality: N/A;   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   HAND SURGERY       IR PERC CHOLECYSTOSTOMY   07/03/2023   SKIN CANCER EXCISION Left      "forearm"   TEE WITHOUT CARDIOVERSION N/A 06/14/2015    Procedure: TRANSESOPHAGEAL ECHOCARDIOGRAM (TEE);  Surgeon: Kerin Perna, MD;  Location: [facility] OR;  Service: Open Heart Surgery;  Laterality: N/A;             Family History  Problem Relation Age of Onset   Stroke Mother     Hypertension Mother     Stomach cancer Father     Liver cancer Father     Cancer Father     Rectal cancer Brother     Cancer Brother     Heart attack Neg Hx          Social History:  reports that he quit smoking about 19 years ago. His smoking use included cigarettes. He started smoking about 64 years ago. He has a 90 pack-year smoking history. His smokeless tobacco use includes chew. He reports that he does not drink alcohol and does not use drugs. Allergies:  Allergies       Allergies  Allergen Reactions   Penicillins Rash                     Medications Prior to Admission  Medication Sig Dispense Refill   acetaminophen (TYLENOL) 325 MG tablet Take 2 tablets (650 mg total) by mouth every 4 (four) hours as needed for mild pain (or temp > 37.5 C (99.5 F)).       aspirin EC 81 MG tablet Take 1 tablet (81 mg total) by mouth daily with breakfast. -Take Aspirin 81 mg daily along with Plavix 75 mg daily for 21 days then after that STOP the Plavix  and continue ONLY Aspirin 81 mg daily indefinitely-- 30 tablet 11   atorvastatin (LIPITOR) 40 MG tablet Take 1 tablet (40 mg total) by  mouth daily. 30 tablet 4   blood glucose meter kit and supplies 1 each by Other route 4 (four) times daily. Dispense based on patient and insurance preference. Use up to four times daily as directed. (FOR ICD-10 E10.9, E11.9). 1 each 0   clopidogrel (PLAVIX) 75 MG tablet Take 1 tablet (75 mg total) by mouth daily. Take Aspirin 81 mg daily along with Plavix 75 mg daily for 21 days then after that STOP the Plavix  and continue ONLY Aspirin 81 mg daily indefinitely-- 30 tablet 0   glucose blood (ONETOUCH VERIO) test strip 1 each by Other route 4 (four) times  daily. Use as instructed 150 each 5   insulin aspart (NOVOLOG) 100 UNIT/ML FlexPen Inject 0-10 Units into the skin 3 (three) times daily with meals. Short acting insulin per sliding scale 0-10 ---:-- Insulin injection 0-10 Units 0-10 Units Subcutaneous, 3 times daily with meals CBG 70 - 120: 0 unit CBG 121 - 150: 0 unit  CBG 151 - 200: 1 unit CBG 201 - 250: 2 units CBG 251 - 300: 4 units CBG 301 - 350: 6 units  CBG 351 - 400: 8 units  CBG > 400: 10 units 15 mL 11   insulin glargine, 2 Unit Dial, (TOUJEO MAX SOLOSTAR) 300 UNIT/ML Solostar Pen Inject 42 Units into the skin at bedtime. 15 mL 0   Insulin Pen Needle (B-D ULTRAFINE III SHORT PEN) 31G X 8 MM MISC 1 each by Does not apply route as directed. 100 each 3   levothyroxine (SYNTHROID) 75 MCG tablet Take 1 tablet (75 mcg total) by mouth daily before breakfast. 90 tablet 0   losartan (COZAAR) 25 MG tablet Take 1 tablet (25 mg total) by mouth daily. 90 tablet 0   metoprolol tartrate (LOPRESSOR) 25 MG tablet Take 1 tablet (25 mg total) by mouth 2 (two) times daily. 180 tablet 0   senna-docusate (SENOKOT-S) 8.6-50 MG tablet Take 2 tablets by mouth at bedtime. 60 tablet 1   traMADol (ULTRAM) 50 MG tablet Take 1 tablet (50 mg total) by mouth every 6 (six) hours as needed. pain 30 tablet 0   traZODone (DESYREL) 100 MG tablet Take 1 tablet (100 mg total) by mouth at bedtime. 30 tablet 2           Home: Home Living Family/patient expects to be discharged to:: Private residence Living Arrangements: Spouse/significant other Available Help at Discharge: Family, Available 24 hours/day Type of Home: House Home Access: Stairs to enter [employer] of Steps: 3 Entrance Stairs-Rails: Left Home Layout: One level Bathroom Shower/Tub: Engineer, manufacturing systems: Handicapped height Bathroom Accessibility: Yes Home Equipment: None  Lives With: Spouse  Functional History: Prior Function Prior Level of Function : Independent/Modified Independent, Driving Mobility Comments: Tourist information centre manager without AD ADLs Comments: Independent; drives Functional Status:  Mobility: Bed Mobility Overal bed mobility: Needs Assistance Bed Mobility: Supine to Sit Rolling: Mod assist Sidelying to sit: Max assist, +2 for physical assistance Supine to sit: Mod assist, HOB elevated, Used rails Sit to supine: Max assist, +2 for physical assistance General bed mobility comments: increased time, cues for sequencing, assist with LLE and trunk Transfers Overall transfer level: Needs assistance Equipment used: Ambulation equipment used Transfers: Sit to/from Stand, Bed to chair/wheelchair/BSC Sit to Stand: Max assist, +2 physical assistance Bed to/from chair/wheelchair/BSC transfer type:: Via Financial planner via Lift Equipment: Stedy General transfer comment: STS x 2 trials in stedy. Pt demo flexed posture and L lateral lean. Dependent stedy transfer bed to recliner. Maximove sling in place in recliner for return to bed, if needed. Ambulation/Gait General Gait Details: could not progress due to decreased safety due to cognition   ADL: ADL Overall ADL's : Needs assistance/impaired Eating/Feeding: Moderate assistance, Sitting Eating/Feeding Details (indicate cue type and reason): drink from cup Grooming: Wash/dry face, Maximal assistance, Sitting Upper Body Bathing: Maximal  assistance, Sitting Upper Body Bathing Details (indicate cue type and reason): simulated Lower Body Bathing: Maximal assistance, +2 for physical assistance, Sit to/from stand Lower Body Bathing Details (indicate cue type and reason): simulated Lower Body Dressing: Total assistance Lower Body Dressing Details (indicate cue type  and reason): gripper socks only Toilet Transfer: Maximal assistance, +2 for physical assistance Toilet Transfer Details (indicate cue type and reason): simulated standing at the EOB Toileting- Clothing Manipulation and Hygiene: Maximal assistance, +2 for physical assistance Toileting - Clothing Manipulation Details (indicate cue type and reason): simulated standing at the EOB Functional mobility during ADLs: Maximal assistance, +2 for safety/equipment (standing EOB) General ADL Comments: Pt with decreased sustained attention and increased confusion.  Max assist for transition to EOB with overall mod assist for static sitting balance secondary to posterior lean and LOB.  Greater assist with simulated selfcare tasks this session, likely attributed to decreased cognition.  Will continue to monitor and assess in treatment.  Spouse present during session and concerned with patients confused state as it is not close to his baseline prior to surgery.   Cognition: Cognition Overall Cognitive Status: Impaired/Different from baseline Orientation Level: Oriented to person, Disoriented to place, Disoriented to time, Disoriented to situation Cognition Arousal: Alert Behavior During Therapy: Restless, WFL for tasks assessed/performed Overall Cognitive Status: Impaired/Different from baseline Area of Impairment: Orientation, Attention, Memory, Following commands, Safety/judgement, Awareness, Problem solving Orientation Level: Disoriented to, Time, Situation Current Attention Level: Sustained Memory: Decreased short-term memory Following Commands: Follows one step commands with  increased time, Follows one step commands inconsistently Safety/Judgement: Decreased awareness of safety Awareness: Intellectual Problem Solving: Slow processing, Decreased initiation, Difficulty sequencing, Requires verbal cues, Requires tactile cues General Comments: remains confused but appears to be clearing   Blood pressure (!) 129/58, pulse 81, temperature 97.9 F (36.6 C), temperature source Axillary, resp. rate 14, weight 86.9 kg, SpO2 91%. Physical Exam   PE: Constitution: Appropriate appearance for age. No apparent distress.  Reclining in bedside chair.  Lethargic. Resp: No respiratory distress. No accessory muscle usage. on RA.  Clear to auscultation bilaterally, intermittent mild wet cough, nonproductive. Cardio: Well perfused appearance. No peripheral edema.  No murmurs, rubs, or gallops. Abdomen: Grossly distended, mildly tender on palpation in all quadrants.  Gallbladder drain in place, draining moderate amount of dark bile fluid.  Mild flank bruising bilaterally.  Small umbilical hernia. Skin: Drain site clean, without surrounding erythema or drainage.  Covered in clean gauze. Bilateral upper extremity IVs wrapped in gauze to prevent pulling.   Psych: perseverative on being dead/dying. Neuro: Oriented to self only; lethargic, cognitive delay, active hallucinations/delusions   Neurologic Exam:   DTRs: Reflexes were 2+ in bilateral achilles, patella, biceps, BR and triceps. Babinsky: flexor responses b/l.   Hoffmans: negative b/l Sensory exam: revealed normal sensation in all dermatomal regions in bilateral upper extremities and bilateral lower extremities Motor exam: strength 5/5 throughout bilateral upper extremities, right lower extremities, and with exception of left lower extremity 4+ out of 5 in hip flexion, knee extension, and knee flexion.  5 out of 5 in dorsiflexion and plantarflexion. Coordination: Fine motor coordination deficit in left upper extremity and left  lower extremity.   MSK: Right fifth digit contracture     Lab Results Last 24 Hours       Results for orders placed or performed during the hospital encounter of 07/02/23 (from the past 24 hour(s))  Glucose, capillary     Status: None    Collection Time: 07/05/23  3:03 PM  Result Value Ref Range    Glucose-Capillary 97 70 - 99 mg/dL  Glucose, capillary     Status: Abnormal    Collection Time: 07/05/23  7:44 PM  Result Value Ref Range    Glucose-Capillary 68 (L) 70 -  99 mg/dL  Glucose, capillary     Status: None    Collection Time: 07/05/23  8:05 PM  Result Value Ref Range    Glucose-Capillary 79 70 - 99 mg/dL  Glucose, capillary     Status: Abnormal    Collection Time: 07/05/23  8:49 PM  Result Value Ref Range    Glucose-Capillary 107 (H) 70 - 99 mg/dL  Glucose, capillary     Status: Abnormal    Collection Time: 07/05/23 11:38 PM  Result Value Ref Range    Glucose-Capillary 64 (L) 70 - 99 mg/dL  Glucose, capillary     Status: None    Collection Time: 07/05/23 11:39 PM  Result Value Ref Range    Glucose-Capillary 77 70 - 99 mg/dL  Glucose, capillary     Status: Abnormal    Collection Time: 07/06/23 12:11 AM  Result Value Ref Range    Glucose-Capillary 67 (L) 70 - 99 mg/dL  Glucose, capillary     Status: None    Collection Time: 07/06/23 12:24 AM  Result Value Ref Range    Glucose-Capillary 86 70 - 99 mg/dL  Glucose, capillary     Status: None    Collection Time: 07/06/23  3:25 AM  Result Value Ref Range    Glucose-Capillary 89 70 - 99 mg/dL  CBC     Status: Abnormal    Collection Time: 07/06/23  7:46 AM  Result Value Ref Range    WBC 16.5 (H) 4.0 - 10.5 K/uL    RBC 4.11 (L) 4.22 - 5.81 MIL/uL    Hemoglobin 12.5 (L) 13.0 - 17.0 g/dL    HCT 45.4 (L) 09.8 - 52.0 %    MCV 91.5 80.0 - 100.0 fL    MCH 30.4 26.0 - 34.0 pg    MCHC 33.2 30.0 - 36.0 g/dL    RDW 11.9 14.7 - 82.9 %    Platelets 198 150 - 400 K/uL    nRBC 0.0 0.0 - 0.2 %  Basic metabolic panel     Status:  Abnormal    Collection Time: 07/06/23  7:46 AM  Result Value Ref Range    Sodium 135 135 - 145 mmol/L    Potassium 3.9 3.5 - 5.1 mmol/L    Chloride 101 98 - 111 mmol/L    CO2 27 22 - 32 mmol/L    Glucose, Bld 155 (H) 70 - 99 mg/dL    BUN 44 (H) 8 - 23 mg/dL    Creatinine, Ser 5.62 (H) 0.61 - 1.24 mg/dL    Calcium 8.2 (L) 8.9 - 10.3 mg/dL    GFR, Estimated 40 (L) >60 mL/min    Anion gap 7 5 - 15  Glucose, capillary     Status: Abnormal    Collection Time: 07/06/23  8:20 AM  Result Value Ref Range    Glucose-Capillary 164 (H) 70 - 99 mg/dL  Glucose, capillary     Status: Abnormal    Collection Time: 07/06/23 11:43 AM  Result Value Ref Range    Glucose-Capillary 143 (H) 70 - 99 mg/dL      Imaging Results (Last 48 hours)  No results found.     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 Yes Co-Morbidities requiring supervision/potential complications: Recent sepsis from acute cholecystitis status post drain placement, encephalopathy/delirium secondary  to sepsis and medications, constipation, urinary incontinence, type 2 diabetes complicated by poor p.o. intakes, prerenal AKI, and poor pain control Due to bladder management, bowel management, safety, skin/wound care, disease management, medication administration, pain management, and patient education, does the patient require 24 hr/day rehab nursing? Yes Does the patient require coordinated care of a physician, rehab nurse, therapy disciplines of PT, OT, and SLP to address physical and functional deficits in the context of the above medical diagnosis(es)? Yes Addressing deficits in the following areas: balance, endurance, locomotion, strength, transferring, bowel/bladder control, bathing, dressing, feeding, grooming,  toileting, and cognition Can the patient actively participate in an intensive therapy program of at least 3 hrs of therapy per day at least 5 days per week? Potentially The potential for patient to make measurable gains while on inpatient rehab is good Anticipated functional outcomes upon discharge from inpatient rehab are supervision  with PT, supervision with OT, independent with SLP. Estimated rehab length of stay to reach the above functional goals is: 10-14 days Anticipated discharge destination: Home Overall Rehab/Functional Prognosis: good   POST ACUTE RECOMMENDATIONS: This patient's condition is appropriate for continued rehabilitative care in the following setting:  TBD, CIR vs.  SNF Patient has agreed to participate in recommended program. N/A; patient does not currently have decision-making capacity Note that insurance prior authorization may be required for reimbursement for recommended care.   Comment: Prior to acute hospitalization for cholecystitis and sepsis, patient was making excellent gains at inpatient rehab and participating well.  Given that his current cognitive deficits started with sepsis and MRI did not show any new neurologic injury, anticipate that patient's cognition should improve back to a level where he can participate meaningfully with therapies and progress towards discharge home in the next few days.  Therefore, pending medical stability and continued cognitive recovery, tentatively recommend return to CIR.     Patient's wife has COPD and endorses that she can handle the patient at a supervision to contact-guard level, with intermittent physical assistance from her son who lives nearby.  If patient does not make meaningful cognitive recovery, he would exceed caregiver availability in his current home and would be more appropriate for SNF level.     MEDICAL RECOMMENDATIONS: Recommend chest x-ray and speech therapy evaluation given ongoing wet cough and  encephalopathy, which may be causing aspiration Agree with discontinuation of mitts, limit physical restraints due to risk of increasing delirium. For delirium management, discussed with wife daytime stimulation and reorientation, recommend limiting lights and call bell's at nighttime.   Recommend daily out of bed to chair for at least 30 minutes to prevent deconditioning   I have personally performed a face to face diagnostic evaluation of this patient. Additionally, I have examined the patient's medical record including any pertinent labs and radiographic images. If the physician assistant has documented in this note, I have reviewed and edited or otherwise concur with the physician assistant's documentation.   Thanks,   [name], DO 07/06/2023          Routing History

## 2023-07-15 NOTE — Progress Notes (Signed)
Lower extremity venous duplex completed. Please see CV Procedures for preliminary results.  Shona Simpson, RVT 07/15/23 12:51 PM

## 2023-07-15 NOTE — Evaluation (Addendum)
Physical Therapy Assessment and Plan  Patient Details  Name: Hunter Sparks MRN: [id number] Date of Birth: [birth year]  PT Diagnosis: Abnormal posture, Abnormality of gait, Cognitive deficits, Difficulty walking, Hemiparesis non-dominant, Impaired cognition, Low back pain, and Muscle weakness Rehab Potential: Fair ELOS: 3-4 weeks   Today's Date: 07/15/2023 PT Individual Time: 8295-6213, [id number] PT Individual Time Calculation (min): 73 min, 55 min   Hospital Problem: Principal Problem:   Acute CVA (cerebrovascular accident) [facility])   Past Medical History:  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HCC)    Arthritis    "back, hips, legs" (06/10/2015)   Chest pain    Chronic lower back pain    Depression    DJD (degenerative joint disease)    Hyperlipidemia 06/11/2015   Hypertension    Hypothyroidism    Kidney stone    Kidney stone    Type II diabetes mellitus (HCC)    Past Surgical History:  Past Surgical History:  Procedure Laterality Date   CARDIAC CATHETERIZATION N/A 06/11/2015   Procedure: Left Heart Cath and Coronary Angiography;  Surgeon: Lyn Records, MD;  Location: [facility] INVASIVE CV LAB;  Service: Cardiovascular;  Laterality: N/A;   COLONOSCOPY  06/21/2011   Procedure: COLONOSCOPY;  Surgeon: Malissa Hippo, MD;  Location: [facility];  Service: Endoscopy;  Laterality: N/A;   COLONOSCOPY     COLONOSCOPY N/A 09/20/2017   Procedure: COLONOSCOPY;  Surgeon: Malissa Hippo, MD;  Location: [facility];  Service: Endoscopy;  Laterality: N/A;  1200   CORONARY ARTERY BYPASS GRAFT N/A 06/14/2015   Procedure: CORONARY ARTERY BYPASS GRAFTING times four using Left Internal mammary artery and right leg Saphenous vein graft;  Surgeon: Kerin Perna, MD;  Location: [facility] OR;  Service: Open Heart Surgery;  Laterality: N/A;   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   HAND SURGERY     IR PERC CHOLECYSTOSTOMY  07/03/2023   SKIN CANCER EXCISION Left    "forearm"    TEE WITHOUT CARDIOVERSION N/A 06/14/2015   Procedure: TRANSESOPHAGEAL ECHOCARDIOGRAM (TEE);  Surgeon: Kerin Perna, MD;  Location: [facility];  Service: Open Heart Surgery;  Laterality: N/A;    Assessment & Plan Clinical Impression: Patient is a 77 y.o. 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 Placed on Plavix and aspirin for 3 weeks beginning 8/09. Echo  with ~70-75% EF and grade 1 diastolic dysfunction. Admitted to CIR on 8/10. On 07/02/2023 had findings of some cognitive changes with nausea vomiting right upper quadrant discomfort. Low-grade fever 99.1 WBC 16,100 lactic acid elevated 2.9 with follow-up WBC of 22,500. Transferred to acute TRH service. CT c/w acute cholecystitis. Antibiotics continue. General surgery and IR consulted for cholecystostomy drain and this was placed 8/20. Operative cultures positive for Klebsiella, patient treated with IV Rocephin. Hospital stay has been complicated by poor p.o. intakes, ongoing encephalopathy, type 2 diabetes, AKI on maintenance fluids, and abdominal and back pain on oxycodone and tramadol due to Dilaudid causing hypotension. Percutaneous cholecystostomy drain to remain in place at least 6 weeks. Recommend fluoroscopy with injection of the drain in IR to evaluate for patency of the cystic duct. IR will follow up with the patient in approximately 6 weeks. A scheduler from their office will call the patient with a date/time of his appointment. Palliative care consulted on 8/25 for goals of care. Follow-up with patient and spouse on 8/26 and  patient has decided on DNR/DNI. Sepsis resolved. Statin on hold per family request per Dr. Benjamine Mola. CMP with persistent mild elevation of AST. There was consideration to placing Cortrak due to poor PO intake, however notes indicate he is eating better last 24 hours. Reviewing Is and Os, there is no PO intake documented back to 8/26.  8/29: Worked on sit>stand from recliner to RW, needed max A +2, esp on L side. After several trials pt was more fatigued and could not progress to ambulation. The patient requires inpatient medicine and rehabilitation evaluations and services for ongoing dysfunction secondary to scattered small infarct right basal ganglia, frontal, parietal, occipital lobes-MCA/PCA watershed distribution.    He has a supportive spouse with 24/7 care giving available, lives in single level home with 3 steps to enter. Reports that he quit smoking about 19 years ago. His smoking use included cigarettes. He started smoking about 64 years ago. He has a 90 pack-year smoking history. His smokeless tobacco use includes chew. He reports that he does not drink alcohol and does not use drugs.    Patient currently requires max with mobility secondary to muscle weakness, decreased cardiorespiratoy endurance, impaired timing and sequencing, unbalanced muscle activation, decreased coordination, and decreased motor planning, decreased initiation, decreased attention, decreased awareness, decreased problem solving, decreased safety awareness, and delayed processing, and decreased sitting balance, decreased standing balance, decreased postural control, hemiplegia, and decreased balance strategies.  Prior to hospitalization, patient was independent  with mobility and lived with Spouse in a House home.  Home access is 3Stairs to enter.  Patient will benefit from skilled PT intervention to maximize safe functional mobility, minimize fall risk, and decrease caregiver burden for planned discharge home with 24 hour assist.  Anticipate patient will benefit from follow up [facility] at discharge.  PT - End of Session Activity Tolerance: Tolerates 30+ min activity with multiple rests Endurance Deficit: Yes PT Assessment Rehab Potential (ACUTE/IP ONLY): Fair PT Barriers to Discharge: Inaccessible home environment;Home environment access/layout;Decreased  caregiver support;Behavior;Lack of/limited family support PT Patient demonstrates impairments in the following area(s): Balance;Behavior;Endurance;Motor;Perception;Safety;Sensory PT Transfers Functional Problem(s): Bed Mobility;Bed to Chair;Car PT Locomotion Functional Problem(s): Ambulation;Stairs;Wheelchair Mobility PT Plan PT Intensity: Minimum of 1-2 x/day ,45 to 90 minutes PT Frequency: 5 out of 7 days PT Duration Estimated Length of Stay: 3-4 weeks PT Treatment/Interventions: Ambulation/gait training;Discharge planning;Functional mobility training;Psychosocial support;Therapeutic Activities;Visual/perceptual remediation/compensation;Balance/vestibular training;Disease management/prevention;Neuromuscular re-education;Skin care/wound management;Therapeutic Exercise;Wheelchair propulsion/positioning;Cognitive remediation/compensation;DME/adaptive equipment instruction;Pain management;Splinting/orthotics;UE/LE Strength taining/ROM;Community reintegration;Functional electrical stimulation;Patient/family education;Stair training;UE/LE Coordination activities PT Transfers Anticipated Outcome(s): CGA PT Locomotion Anticipated Outcome(s): CGA PT Recommendation Follow Up Recommendations: Home health PT Patient destination: Home Equipment Recommended: To be determined   PT Evaluation Precautions/Restrictions Precautions Precautions: Fall;Other (comment) Precaution Comments: left hemiparesis, pusher Restrictions Weight Bearing Restrictions: No Other Position/Activity Restrictions: Rt sided percutaneous drain General   Vital Signs  Pain   Pain Interference Pain Interference Pain Effect on Sleep: 1. Rarely or not at all Pain Interference with Therapy Activities: 1. Rarely or not at all Pain Interference with Day-to-Day Activities: 1. Rarely or not at all Home Living/Prior Functioning Home Living Available Help at Discharge: Family;Available 24 hours/day Type of Home: House Home Access:  Stairs to enter [employer] of Steps: 3 Entrance Stairs-Rails: Left Home Layout: One level Bathroom Shower/Tub: Engineer, manufacturing systems: Handicapped height Bathroom Accessibility: Yes Additional Comments: per pt  Lives With: Spouse Prior Function Level of Independence: Independent with gait;Independent with basic ADLs;Independent with transfers;Independent with homemaking with ambulation  Able to Take Stairs?: Yes  Driving: Yes Vocation: Retired Optometrist - History Ability to See in Adequate Light: 1 Impaired Perception Preception Impairment Details: Inattention/Neglect Praxis Praxis Impairment Details: Motor planning;Organization  Cognition Overall Cognitive Status: Impaired/Different from baseline Arousal/Alertness: Awake/alert Orientation Level: Oriented to person;Oriented to place;Disoriented to time;Oriented to situation Year: 2024 Month: July Day of Week: Correct Awareness: Impaired Problem Solving: Impaired Safety/Judgment: Impaired Sensation Sensation Light Touch: Appears Intact Coordination Gross Motor Movements are Fluid and Coordinated: No Fine Motor Movements are Fluid and Coordinated: No Coordination and Movement Description: 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 Righting Reactions: delayed Protective Responses: delayed  Balance Balance Balance Assessed: Yes Static Sitting Balance Static Sitting - Balance Support: Bilateral upper  extremity supported;Feet supported Static Sitting - Level of Assistance: 3: Mod assist;2: Max assist Dynamic Sitting Balance Dynamic Sitting - Balance Support: Bilateral upper extremity supported;During functional activity Dynamic Sitting - Level of Assistance: 2: Max Oncologist Standing - Balance Support: Bilateral upper extremity supported Static Standing - Level of Assistance: 1: +2 Total assist Dynamic Standing Balance Dynamic Standing - Balance Support: Bilateral upper extremity supported Dynamic Standing - Level of Assistance: 1: +2 Total assist Extremity Assessment      RLE Assessment RLE Assessment: Exceptions to [facility] General Strength Comments: grossly 3+/5 LLE Assessment LLE Assessment: Within Functional Limits  Care Tool Care Tool Bed Mobility Roll left and right activity   Roll left and right assist level: Minimal Assistance - Patient > 75%    Sit to lying activity   Sit to lying assist level: Moderate Assistance - Patient 50 - 74%    Lying to sitting on side of bed activity   Lying to sitting on side of bed assist level: the ability to move from lying on the back to sitting on the side of the bed with no back support.: Moderate Assistance - Patient 50 - 74%     Care Tool Transfers Sit to stand transfer   Sit to stand assist level: 2 Helpers    Chair/bed transfer   Chair/bed transfer assist level: 2 Warehouse manager transfer activity did not occur: Safety/medical concerns        Care Tool Locomotion Ambulation   Assist level: 2 helpers Assistive device: Other (comment) (3 musketeers and/or use of R UE on rail) Max distance: 5'  Walk 10 feet activity Walk 10 feet activity did not occur: Safety/medical concerns       Walk 50 feet with 2 turns activity Walk 50 feet with 2 turns activity did not occur: Safety/medical concerns      Walk 150 feet activity Walk 150 feet activity did not occur:  Safety/medical concerns      Walk 10 feet on uneven surfaces activity Walk 10 feet on uneven surfaces activity did not occur: Safety/medical concerns      Stairs Stair activity did not occur: Safety/medical concerns        Walk up/down 1 step activity Walk up/down 1 step or curb (drop down) activity did not occur: Safety/medical  concerns      Walk up/down 4 steps activity Walk up/down 4 steps activity did not occur: Safety/medical concerns      Walk up/down 12 steps activity Walk up/down 12 steps activity did not occur: Safety/medical concerns      Pick up small objects from floor Pick up small object from the floor (from standing position) activity did not occur: Safety/medical concerns      Wheelchair Is the patient using a wheelchair?: Yes Type of Wheelchair: Manual (TIS)   Wheelchair assist level: Total Assistance - Patient < 25%    Wheel 50 feet with 2 turns activity   Assist Level: Total Assistance - Patient < 25%  Wheel 150 feet activity   Assist Level: Dependent - Patient 0%    Refer to Care Plan for Long Term Goals  SHORT TERM GOAL WEEK 1 PT Short Term Goal 1 (Week 1): pt will maintain dynamic seated balance with mod A PT Short Term Goal 2 (Week 1): Pt will perform bed to chair transfer with LRAD and mod A PT Short Term Goal 3 (Week 1): pt will perform sit to stand with LRAD and mod A PT Short Term Goal 4 (Week 1): pt will ambulated 20 feet with LRAD and max A  Recommendations for other services: Neuropsych and Therapeutic Recreation  Pet therapy, Kitchen group, Stress management, and Outing/community reintegration  Skilled Therapeutic Intervention Mobility Bed Mobility Rolling Right: Minimal Assistance - Patient > 75% Rolling Left: Supervision/Verbal cueing Supine to Sit: Moderate Assistance - Patient 50-74% Transfers Transfers: Sit to Stand;Stand to Sit;Squat Pivot Transfers Sit to Stand: 2 Helpers;Maximal Assistance - Patient 25-49% Stand to Sit: 2  Helpers;Maximal Assistance - Patient 25-49% Squat Pivot Transfers: 2 Helpers Transfer (Assistive device): None Locomotion  Gait Ambulation: Yes Gait Assistance: 2 Teacher, music (Feet): 5 Feet Assistive device: 2 person hand held assist (3 musketeers and with R UE support on handrail and +2 for WC follow) Gait Assistance Details: Verbal cues for sequencing;Verbal cues for precautions/safety Gait Gait: Yes Gait Pattern: Impaired Gait Pattern: Wide base of support;Trunk flexed;Step-to pattern;Lateral trunk lean to left;Left flexed knee in stance;Right flexed knee in stance Gait velocity: slow Stairs / Additional Locomotion Stairs: No Wheelchair Mobility Wheelchair Mobility: Yes Wheelchair Assistance: Total Assistance - Patient <25% Wheelchair Parts Management: Needs assistance   Discharge Criteria: Patient will be discharged from PT if patient refuses treatment 3 consecutive times without medical reason, if treatment goals not met, if there is a change in medical status, if patient makes no progress towards goals or if patient is discharged from hospital.  The above assessment, treatment plan, treatment alternatives and goals were discussed and mutually agreed upon: by patient  Treatment Session 1    Pt supine in bed upon arrival, wearing posey belt with velcro straps. Pt agreeable to therapy. Pt denies any pain. Evaluation completed (see details above and below) with education on PT POC and goals and individual treatment initiated with focus on upright tolerance, safety awareness and midline orientation. Education on PT evaluation and CIR policies and therapy schedule.   Pt performed rolling to R with min A, and to L with supervision from flat bed with no bed rails. Supine to sit with mod A, seated balance with mod-max A 2/2 L lateral and forward trunk lean, able to correct lateral trunk lean intermittently with R UE support on bed rail and use of elbow/forearm on bed with  fagiue however requires CGA-max A with fatigue for correction of forward  trunk lean. Squat pivot transfer with +2 max A to L, trialed with use of L UE on arm rest, and with use of R UE on arm rest, pt requiring +2 heavy max A to R 2/2 pushing to L.  Pt requiring +2 for sit<>stand and gait x5 feet with 3 musketeers and gait with R UE support on rail, with PT stabilizing L LE for buckling and facilitating upright posture with trunk extension and glute activation.   Pt performed steady with +1 min A, recommending use of steady with +2 in room. Pt seated in stead at end of session with all needs within reach and seatbelt alarm on.     Treatment Session 2   Pt asleep seated in TIS WC upon arrival. Pt agreeable to therapy. Pt reports 7/10 pain in low back, pt reports it reduced to 4/10 with therapy. Pt positioned pt in bed at end of session to assist with pain relief.   Pt stood in standing frame x10 for 1-3 min each time with therapist providing max verbal and tactile cues provided for initiation of upright posture with emphasis on use of UE to push through table and glute activation. Pt requiring mod-max A to obtain full upright posture, unable to sustain without assist from therapist.   Pt performed stedy transfer to bed with mod A for power up. Sit<>supine with mod A for B LE. Pt supine in bed at end of session with all needs within reach, posey belt and bed alarm on.   [facility], Roper, DPT  07/15/2023, 12:25 PM

## 2023-07-15 NOTE — Progress Notes (Signed)
Hunter Sheriff, DO  Physician Physical Medicine and Rehabilitation   PMR Pre-admission    Signed   Date of Service: 07/06/2023  2:03 PM  Related encounter: Admission (Discharged) from 07/02/2023 in [facility]  SURGICAL   Signed     Expand All Collapse All  Show:Clear all [x] Written[x] Templated[x] Copied  Added by: [x] Standley Brooking, RN  [] Hover for details PMR Admission Coordinator Pre-Admission Assessment   Patient: Hunter Sparks is an 77 y.o., male MRN: [id number] DOB: 10/14/1946 Height:   Weight: 88.8 kg                                                                                                                                                  Insurance Information HMO: yes    PPO:      PCP:      IPA:      80/20:      OTHER:  PRIMARY: [employer]      Policy#: [id number]      Subscriber: pt CM Name: Tammy      Phone#: [phone]     Fax#: [phone]/EMR access Pre-Cert#: [id number] approved after peer to peer  on 8/28 for 7 days 07/14/23 until 07/21/23     Employer:  Benefits:  Phone #: [phone]     Name: 8/9 Eff. Date: 11/13/22     Deduct: none      Out of Pocket Max: $3500      Life Max: none  CIR: $225 co pay per day days 1 until 6      SNF: no co pay per day days 1 until 20 $203 co pay per day days 21 until 100 Outpatient: $15 per visit     Co-Pay: visits per medical neccesity Home Health: 80%      Co-Pay: 20% DME: 80%     Co-Pay: 20% Providers: in network  SECONDARY: Guarant[employer]      Policy#: [id number]   Financial Counselor:       Phone#:    The "Data Collection Information Summary" for patients in Inpatient Rehabilitation Facilities with attached "Privacy Act Statement-Health Care Records" was provided and verbally reviewed with: Family   Emergency Contact Information Contact Information       Name Relation Home Work [place]     [phone]         Other Contacts        Name Relation Home Work Mobile    [place] Son     [phone]    [place], [place]     [phone]         Current Medical History  Patient Admitting Diagnosis: Sepsis related to acute cholecystitis, CVA   History of Present Illness: 77 y.o. right-handed male with history of hypertension hyperlipidemia insulin-dependent diabetes mellitus  CAD, CKD stage III BPH hypothyroidism admitted 06/21/2023 after being found with slurred speech and left-sided weakness.  Patient had stroke workup MRI showed scattered small infarct right basal ganglia, frontal, parietal, occipital lobes-MCA/PCA watershed distribution.  Patient was seen by neurology services and placed on low-dose aspirin and Plavix x 3 weeks then aspirin alone.  Close monitoring of permissive hypertension. Admitted to CIR 8/10.   Follow-up MRI due to some questionable cognitive changes showed no acute changes. Remained on low-dose aspirin and Plavix x 3 weeks then aspirin alone. Pain management use of tramadol discontinued due to some cognitive changes. He was using trazodone as scheduled from prior to admission for sleep. CAD/hypertension permissive hypertension he did require hydralazine as needed slow reintroduction of metoprolol. CKD stage III creatinine baseline 1.2-1.3 due to some ongoing bouts of nausea vomiting was placed on IV fluids for monitoring of hydration. Blood sugars hemoglobin A1c 8.8 insulin therapy as directed. Hypothyroidism with Synthroid replacement. Grade 1 diastolic dysfunction monitoring for any signs of fluid overload. On 07/02/2023 findings of some cognitive changes with nausea vomiting right upper quadrant discomfort. Low-grade fever 99.1 WBC 16,100 lactic acid elevated 2.9 with follow-up WBC of 22,500. He did have some hyponatremia workup ongoing. Blood cultures were pending. Due to these ongoing medical changes hospitalist team was consulted patient was discharged to acute care services for workup of gallbladder  as well as possible sepsis on 07/02/23.   WBC elevated at 22.5. Right upper quadrant Ultrasound showed acute cholecystitis. General surgery consulted and recommended IR place drain. High risk for surgery given Plavix and recent CVA.  Drain was placed on 8/20. Antibiotics changed based on culture; Klebsiella. BP issues due to Dilaudid for pain. Delirium precautions with trial of mittens.  Changed from Ceftriaxone to linezolid. Will need to follow up with general surgery to discuss timing of cholecystectomy. Diet advanced as concern for ileus resolved.    Palliative consulted for GOC, but wife feels they can make decisions on their own. No longer wants palliative involvement.    Complete NIHSS TOTAL: 3 Glasgow Coma Scale Score: 15   Patient's medical record from [facility] has been reviewed by the rehabilitation admission coordinator and physician.   Past Medical History      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HCC)     Arthritis      "back, hips, legs" (06/10/2015)   Chest pain     Chronic lower back pain     Depression     DJD (degenerative joint disease)     Hyperlipidemia 06/11/2015   Hypertension     Hypothyroidism     Kidney stone     Kidney stone     Type II diabetes mellitus (HCC)          Has the patient had major surgery during 100 days prior to admission? Yes   Family History  family history includes Cancer in his brother and father; Hypertension in his mother; Liver cancer in his father; Rectal cancer in his brother; Stomach cancer in his father; Stroke in his mother.   Current Medications   Current Medications    Current Facility-Administered Medications:    0.9 %  sodium chloride infusion, , Intravenous, PRN, Joseph Art, DO, Last Rate: 10 mL/hr at 07/10/23 1543, New Bag at 07/10/23 1543   acetaminophen (TYLENOL) tablet 1,000 mg, 1,000 mg, Oral, TID, Vann, Jessica U, DO, 1,000 mg at 07/13/23 0844   alum & mag hydroxide-simeth  (  MAALOX/MYLANTA) 200-200-20 MG/5ML suspension 15 mL, 15 mL, Oral, Q6H PRN, Benjamine Mola, Jessica U, DO, 15 mL at 07/09/23 1610   aspirin EC tablet 81 mg, 81 mg, Oral, Q breakfast, Carollee Herter, DO, 81 mg at 07/13/23 9604   bisacodyl (DULCOLAX) suppository 10 mg, 10 mg, Rectal, Daily PRN, Vann, Jessica U, DO   clopidogrel (PLAVIX) tablet 75 mg, 75 mg, Oral, Daily, Carollee Herter, DO, 75 mg at 07/13/23 0844   feeding supplement (ENSURE ENLIVE / ENSURE PLUS) liquid 237 mL, 237 mL, Oral, TID BM, Vann, Jessica U, DO, 237 mL at 07/13/23 0847   heparin injection 5,000 Units, 5,000 Units, Subcutaneous, Q8H, Carollee Herter, DO, 5,000 Units at 07/13/23 0543   hydrALAZINE (APRESOLINE) injection 10 mg, 10 mg, Intravenous, Q6H PRN, Vann, Jessica U, DO, 10 mg at 07/11/23 0342   insulin aspart (novoLOG) injection 0-5 Units, 0-5 Units, Subcutaneous, QHS, Vann, Jessica U, DO, 2 Units at 07/11/23 2117   insulin aspart (novoLOG) injection 0-9 Units, 0-9 Units, Subcutaneous, TID WC, Vann, Jessica U, DO, 1 Units at 07/13/23 0844   levofloxacin (LEVAQUIN) tablet 250 mg, 250 mg, Oral, Q24H, Osvaldo Shipper, MD, 250 mg at 07/13/23 0546   levothyroxine (SYNTHROID) tablet 75 mcg, 75 mcg, Oral, Q0600, Carollee Herter, DO, 75 mcg at 07/13/23 0546   lidocaine (LIDODERM) 5 % 1 patch, 1 patch, Transdermal, Q24H, Vann, Jessica U, DO, 1 patch at 07/10/23 1423   metoprolol tartrate (LOPRESSOR) tablet 50 mg, 50 mg, Oral, BID, Vann, Jessica U, DO, 50 mg at 07/13/23 0844   OLANZapine (ZYPREXA) tablet 5 mg, 5 mg, Oral, Daily, Ferolito, Albertina Senegal, NP, 5 mg at 07/13/23 0844   ondansetron (ZOFRAN) tablet 4 mg, 4 mg, Oral, Q6H PRN, 4 mg at 07/04/23 0909 **OR** ondansetron (ZOFRAN) injection 4 mg, 4 mg, Intravenous, Q6H PRN, Carollee Herter, DO, 4 mg at 07/05/23 0324   pantoprazole (PROTONIX) EC tablet 40 mg, 40 mg, Oral, Daily, Vann, Jessica U, DO, 40 mg at 07/13/23 0844   simethicone (MYLICON) chewable tablet 80 mg, 80 mg, Oral, QID PRN, Marlin Canary U, DO, 80 mg at  07/06/23 1707   sodium chloride flush (NS) 0.9 % injection 5 mL, 5 mL, Intracatheter, Q8H, Wagner, Jaime, DO, 5 mL at 07/13/23 0548   traMADol (ULTRAM) tablet 50 mg, 50 mg, Oral, TID, Ernie Avena, NP, 50 mg at 07/13/23 0844   traZODone (DESYREL) tablet 100 mg, 100 mg, Oral, QHS, Carollee Herter, DO, 100 mg at 07/12/23 2103     Patients Current Diet:  Diet Order                  DIET DYS 3 Room service appropriate? Yes with Assist; Fluid consistency: Thin  Diet effective now                       Precautions / Restrictions Precautions Precautions: Fall, Other (comment) Precaution Comments: left hemiparesis Restrictions Weight Bearing Restrictions: No Other Position/Activity Restrictions: Rt sided percutaneous drain    Has the patient had 2 or more falls or a fall with injury in the past year?No   Prior Activity Level Community (5-7x/wk): independent, active and driving, retired   Prior Functional Level Prior Function Prior Level of Function : Independent/Modified Independent, Driving Mobility Comments: Tourist information centre manager without AD ADLs Comments: Independent; drives   Self Care: Did the patient need help bathing, dressing, using the toilet or eating?  Independent   Indoor Mobility: Did the patient need assistance with walking  from room to room (with or without device)? Independent   Stairs: Did the patient need assistance with internal or external stairs (with or without device)? Independent   Functional Cognition: Did the patient need help planning regular tasks such as shopping or remembering to take medications? Independent   Patient Information Are you of Hispanic, Latino/a,or Spanish origin?: X. Patient unable to respond (wife states no) What is your race?: X. Patient unable to respond (wife states white) Do you need or want an interpreter to communicate with a doctor or health care staff?: 9. Unable to respond (wife states no)   Patient's Response To:   Health Literacy and Transportation Is the patient able to respond to health literacy and transportation needs?: No Health Literacy - How often do you need to have someone help you when you read instructions, pamphlets, or other written material from your doctor or pharmacy?: Patient unable to respond In the past 12 months, has lack of transportation kept you from medical appointments or from getting medications?: No (per wife) In the past 12 months, has lack of transportation kept you from meetings, work, or from getting things needed for daily living?: No (per wife)   Journalist, newspaper / Equipment Home Assistive Devices/Equipment: None Home Equipment: None   Prior Device Use: Indicate devices/aids used by the patient prior to current illness, exacerbation or injury? None of the above   Current Functional Level Cognition   Overall Cognitive Status: Impaired/Different from baseline Current Attention Level: Sustained, Selective Orientation Level: Oriented X4 Following Commands: Follows one step commands with increased time, Follows one step commands consistently, Follows multi-step commands inconsistently Safety/Judgement: Decreased awareness of safety, Decreased awareness of deficits General Comments: alert but believed he could transfer to recliner without assistance    Extremity Assessment (includes Sensation/Coordination)   Upper Extremity Assessment: Difficult to assess due to impaired cognition LUE Deficits / Details: Pt able to squeeze therapist's hands to command with WNL grip bilaterally.  He was also able to bring his LUE up to scratch behind his neck.  Functionally, he was able to use the LUE to assist the right with holding his orange juice but coordination overall looks to be slightly impaired.  Will look at more specifically in function. LUE Sensation:  (unable to determine) LUE Coordination: decreased gross motor, decreased fine motor  Lower Extremity Assessment: Defer  to PT evaluation LLE Deficits / Details: able to move LLE against gravity but weaker than R, unable to be formally tested due to cognitive deficits     ADLs   Overall ADL's : Needs assistance/impaired Eating/Feeding: Moderate assistance, Sitting Eating/Feeding Details (indicate cue type and reason): drink from cup Grooming: Wash/dry hands, Wash/dry face, Oral care, Brushing hair, Supervision/safety, Sitting, Cueing for sequencing Upper Body Bathing: Maximal assistance, Sitting Upper Body Bathing Details (indicate cue type and reason): simulated Lower Body Bathing: Maximal assistance, +2 for physical assistance, Sit to/from stand Lower Body Bathing Details (indicate cue type and reason): simulated Upper Body Dressing : Minimal assistance, Sitting Lower Body Dressing: Total assistance Lower Body Dressing Details (indicate cue type and reason): gripper socks only Toilet Transfer: Maximal assistance Toilet Transfer Details (indicate cue type and reason): simulated to chair Toileting- Clothing Manipulation and Hygiene: Total assistance, Sit to/from stand, Sitting/lateral lean Toileting - Clothing Manipulation Details (indicate cue type and reason): patient unable to complete peri care when standing in stedy Functional mobility during ADLs: Maximal assistance, +2 for safety/equipment (standing EOB) General ADL Comments: Patient session focus on pre-gait training  in hallway with PT (refer to PT note) and functional mobility to work on ADL tolerance. Patient remains limited with regard to lower body ADLs and toileting, but improved command following and static balance in stedy and when using rail in hallway to address functional deficits. Patient remains at a mod-max A level for ADL management, and mod of 2 for transfers. OT continues to highly recommend intensive therapy services. OT will continue to follow.     Mobility   Overal bed mobility: Needs Assistance Bed Mobility: Supine to Sit Rolling:  Mod assist Sidelying to sit: Mod assist Supine to sit: Mod assist, HOB elevated, +2 for physical assistance Sit to supine: Mod assist General bed mobility comments: instructions on rail use and assistance with trunk and scooting to EOB     Transfers   Overall transfer level: Needs assistance Equipment used: None Transfers: Sit to/from Stand, Bed to chair/wheelchair/BSC Sit to Stand: Max assist Bed to/from chair/wheelchair/BSC transfer type:: Stand pivot Stand pivot transfers: Max assist Transfer via Lift Equipment: Stedy General transfer comment: face to face technique with left knee blocked     Ambulation / Gait / Stairs / Wheelchair Mobility   Ambulation/Gait General Gait Details: to fatigued and lethargic to progress ambulation today Gait velocity: slow     Posture / Balance Dynamic Sitting Balance Sitting balance - Comments: min assist initially and progressed to supervision Balance Overall balance assessment: Needs assistance Sitting-balance support: Single extremity supported, Feet supported Sitting balance-Leahy Scale: Poor Sitting balance - Comments: min assist initially and progressed to supervision Postural control: Left lateral lean Standing balance support: During functional activity, Bilateral upper extremity supported, Reliant on assistive device for balance Standing balance-Leahy Scale: Poor Standing balance comment: stood for transfer     Special needs/care consideration Delirium Precautions Biliary tube Cook slip-coat 10/2 Fr RUQ placed 8/20 DNR on acute Palliative consulted but wife no longer wishes for their involvement.Feels she has ample supports to make decisions without them    Previous Home Environment  Living Arrangements: Spouse/significant other  Lives With: Spouse Available Help at Discharge: Family, Available 24 hours/day Type of Home: House Home Layout: One level Home Access: Stairs to enter Entrance Stairs-Rails: Left Entrance Stairs-Number  of Steps: 3 Bathroom Shower/Tub: Engineer, manufacturing systems: Handicapped height Bathroom Accessibility: Yes How Accessible: Accessible via walker Home Care Services: No   Discharge Living Setting Plans for Discharge Living Setting: Patient's home, Lives with (comment) (wife) Type of Home at Discharge: House Discharge Home Layout: One level Discharge Home Access: Stairs to enter Entrance Stairs-Rails: None Entrance Stairs-Number of Steps: 3 Discharge Bathroom Shower/Tub: Tub/shower unit Discharge Bathroom Toilet: Handicapped height Discharge Bathroom Accessibility: Yes How Accessible: Accessible via walker Does the patient have any problems obtaining your medications?: No   Social/Family/Support Systems Patient Roles: Spouse, Parent Contact Information: wife, [name] Anticipated Caregiver: wife Anticipated Caregiver's Contact Information: [phone] Ability/Limitations of Caregiver: wife can provide supervision level and light min asisst as needed Caregiver Availability: 24/7 Discharge Plan Discussed with Primary Caregiver: Yes Is Caregiver In Agreement with Plan?: Yes Does Caregiver/Family have Issues with Lodging/Transportation while Pt is in Rehab?: No   Goals Patient/Family Goal for Rehab: supervision PT, supervsion to light min OT, supervision SLP Expected length of stay: ELOS 2 to 3 weeks Additional Information: Patient is very HOH Pt/Family Agrees to Admission and willing to participate: Yes Program Orientation Provided & Reviewed with Pt/Caregiver Including Roles  & Responsibilities: Yes   Decrease burden of Care through IP rehab admission: n/a  Possible need for SNF placement upon discharge:not anticipated   Patient Condition: This patient's medical and functional status has changed since the consult dated: 07/06/23 in which the Rehabilitation Physician determined and documented that the patient's condition is appropriate for intensive rehabilitative care in an  inpatient rehabilitation facility. See "History of Present Illness" (above) for medical update. Functional changes are: overall max assist. Patient's medical and functional status update has been discussed with the Rehabilitation physician and patient remains appropriate for inpatient rehabilitation. Will admit to inpatient rehab 07/14/23 when bed is available.   Preadmission Screen Completed By:  Clois Dupes, RN MSN 07/13/2023 11:54 AM ______________________________________________________________________   Discussed status with Dr. Shearon Stalls on 07/13/23 at 0930 and received approval for admission 07/14/23 when bed is available.   Admission Coordinator:  Clois Dupes, RN MSN time 1610 Date 07/13/23            Revision History

## 2023-07-16 DIAGNOSIS — I10 Essential (primary) hypertension: Secondary | ICD-10-CM | POA: Diagnosis not present

## 2023-07-16 DIAGNOSIS — I639 Cerebral infarction, unspecified: Secondary | ICD-10-CM | POA: Diagnosis not present

## 2023-07-16 DIAGNOSIS — N1831 Chronic kidney disease, stage 3a: Secondary | ICD-10-CM | POA: Diagnosis not present

## 2023-07-16 DIAGNOSIS — K8001 Calculus of gallbladder with acute cholecystitis with obstruction: Secondary | ICD-10-CM | POA: Diagnosis not present

## 2023-07-16 LAB — GLUCOSE, CAPILLARY
Glucose-Capillary: 170 mg/dL — ABNORMAL HIGH (ref 70–99)
Glucose-Capillary: 152 mg/dL — ABNORMAL HIGH (ref 70–99)
Glucose-Capillary: 140 mg/dL — ABNORMAL HIGH (ref 70–99)
Glucose-Capillary: 175 mg/dL — ABNORMAL HIGH (ref 70–99)

## 2023-07-16 NOTE — Progress Notes (Addendum)
Physical Therapy Session Note  Patient Details  Name: Hunter Sparks MRN: [id number] Date of Birth: Jan 02, 1946  Today's Date: 07/16/2023 PT Individual Time: 6213-0865 PT Individual Time Calculation (min): 73 min   Short Term Goals: Week 1:  PT Short Term Goal 1 (Week 1): pt will maintain dynamic seated balance with mod A PT Short Term Goal 2 (Week 1): Pt will perform bed to chair transfer with LRAD and mod A PT Short Term Goal 3 (Week 1): pt will perform sit to stand with LRAD and mod A PT Short Term Goal 4 (Week 1): pt will ambulated 20 feet with LRAD and max A  Skilled Therapeutic Interventions/Progress Updates:    Pt presents in room in bed, initially refusing therapy stating "I can do better on my own" however with increased time ( ~15 minutes), education, and orientation to location, deficits, and goals pt agreeable to PT and requesting to walk. Pt denies pain at this time. Session focused on education, reorientation, transfer training, and gait training to promote improved independence with functional mobility.  Pt completes bed mobility supine to sit with mod assist for BLE mgmt, trunk to upright. Pt able to maintain seated balance with improved midline orientation, CGA, however LOB posteriorly slowly requires min assist to correct. Pt then positioned with stedy in front, bed elevated. Pt completes stand to stedy with CGA and maintains great midline orientation in standing ~30" with BUE support on stedy. Pt sits to posterior pads on stedy and transferred to [facility], completes sit to stand with min assist with stedy and to WC. Pt shoes donned in sitting total assist for time management.  Pt transported to main gym dependently for time management. Pt completes sit<>stand to eva walker with max assist and ambulates 68' max assist x1 for maintaining upright posture as well as RLE advance with pt demonstrating poor foot clearance, min assist of 2nd person for managing eva walker during ambulation.  Pt turns to sit with max assist x2 and completes seated rest  break. Pt demonstrates significantly decreased L terminal knee extension in stance phase and would benefit from NMR for quad strengthening in closed chain position. Pt ambulates back to WC 68' max assist x1 and min assist x1, DF assist wrap donned to LLE with pt demonstrating ability to clear LLE 50% of the time. Pt returns to sitting in [facility] for seated rest break.  Pt then completes NMR in sitting for LLE strengthening including L knee quad sets, LLE LAQs x10 each, max verbal, tactile, visual cues due to poor motor planning and sequencing however is able to complete with increased time for processing.  Pt requires frequent encouragement throughout session as pt demonstrating decreased confidence throughout, improves during session.  Pt returns to room and completes transfer via stedy, mod assist to stand with fatigue with increased L lateral lean noted. Pt completes bed mobility mod assist for BLE mgmt and verbal cues for positioning in supine. Pt remains supine with all needs within reach, cal light in place and bed alarm activated medium sensitivity end of session.   Therapy Documentation Precautions:  Precautions Precautions: Fall Precaution Comments: left hemiparesis, pusher Restrictions Weight Bearing Restrictions: No Other Position/Activity Restrictions: Rt sided percutaneous drain   Therapy/Group: Individual Therapy  Edwin Cap PT, DPT 07/16/2023, 3:45 PM

## 2023-07-16 NOTE — Plan of Care (Signed)
  Problem: RH Cognition - SLP Goal: RH LTG Patient will demonstrate orientation with cues Description:  LTG:  Patient will demonstrate orientation to person/place/time/situation with cues (SLP)   Flowsheets (Taken 07/16/2023 1216) LTG Patient will demonstrate orientation to:  Place  Time  Situation LTG: Patient will demonstrate orientation using cueing (SLP): Supervision   Problem: RH Problem Solving Goal: LTG Patient will demonstrate problem solving for (SLP) Description: LTG:  Patient will demonstrate problem solving for basic/complex daily situations with cues  (SLP) Flowsheets (Taken 07/16/2023 1216) LTG: Patient will demonstrate problem solving for (SLP): Basic daily situations LTG Patient will demonstrate problem solving for: Supervision   Problem: RH Memory Goal: LTG Patient will use memory compensatory aids to (SLP) Description: LTG:  Patient will use memory compensatory aids to recall biographical/new, daily complex information with cues (SLP) Flowsheets (Taken 07/16/2023 1216) LTG: Patient will use memory compensatory aids to (SLP): Supervision   Problem: RH Attention Goal: LTG Patient will demonstrate this level of attention during functional activites (SLP) Description: LTG:  Patient will will demonstrate this level of attention during functional activites (SLP) Flowsheets (Taken 07/16/2023 1216) Patient will demonstrate during cognitive/linguistic activities the attention type of: Selective LTG: Patient will demonstrate this level of attention during cognitive/linguistic activities with assistance of (SLP): Supervision

## 2023-07-16 NOTE — Progress Notes (Signed)
PROGRESS NOTE   Subjective/Complaints:  Pt up in bed. Slept ok. No agitated behavior reported. Issues with soft waist belt rubbing up against drain line/abdomen  ROS: Patient denies fever, rash, sore throat, blurred vision, dizziness, nausea, vomiting, diarrhea, cough, shortness of breath or chest pain, joint or back/neck pain, headache, or mood change.   Objective:   VAS [place] LOWER EXTREMITY VENOUS (DVT)  Result Date: 07/15/2023  Lower Venous DVT Study Patient Name:  Hunter Sparks  Date of Exam:   07/15/2023 Medical Rec #: [id number]         Accession #:    [id number] Date of Birth: 11/07/1946         Patient Gender: M Patient Age:   77 years Exam Location:  [facility] Procedure:      VAS [place] LOWER EXTREMITY VENOUS (DVT) Referring Phys: Wendi Maya --------------------------------------------------------------------------------  Indications: Pain, and Edema.  Risk Factors: Immobility. Comparison Study: No prior study Performing Technologist: Shona Simpson  Examination Guidelines: A complete evaluation includes B-mode imaging, spectral Doppler, color Doppler, and power Doppler as needed of all accessible portions of each vessel. Bilateral testing is considered an integral part of a complete examination. Limited examinations for reoccurring indications may be performed as noted. The reflux portion of the exam is performed with the patient in reverse Trendelenburg.  +---------+---------------+---------+-----------+----------+--------------+ RIGHT    CompressibilityPhasicitySpontaneityPropertiesThrombus Aging +---------+---------------+---------+-----------+----------+--------------+ CFV      Full           Yes      Yes                                 +---------+---------------+---------+-----------+----------+--------------+ SFJ      Full                                                         +---------+---------------+---------+-----------+----------+--------------+ FV Prox  Full                                                        +---------+---------------+---------+-----------+----------+--------------+ FV Mid   Full                                                        +---------+---------------+---------+-----------+----------+--------------+ FV DistalFull                                                        +---------+---------------+---------+-----------+----------+--------------+ PFV      Full                                                        +---------+---------------+---------+-----------+----------+--------------+  POP      Full           Yes      Yes                                 +---------+---------------+---------+-----------+----------+--------------+ PTV      Full                                                        +---------+---------------+---------+-----------+----------+--------------+ PERO     Full                                                        +---------+---------------+---------+-----------+----------+--------------+   +---------+---------------+---------+-----------+----------+--------------+ LEFT     CompressibilityPhasicitySpontaneityPropertiesThrombus Aging +---------+---------------+---------+-----------+----------+--------------+ CFV      Full           Yes      Yes                                 +---------+---------------+---------+-----------+----------+--------------+ SFJ      Full                                                        +---------+---------------+---------+-----------+----------+--------------+ FV Prox  Full                                                        +---------+---------------+---------+-----------+----------+--------------+ FV Mid   Full                                                         +---------+---------------+---------+-----------+----------+--------------+ FV DistalFull                                                        +---------+---------------+---------+-----------+----------+--------------+ PFV      Full                                                        +---------+---------------+---------+-----------+----------+--------------+ POP      Full           Yes      Yes                                 +---------+---------------+---------+-----------+----------+--------------+  PTV      Full                                                        +---------+---------------+---------+-----------+----------+--------------+ PERO     Full                                                        +---------+---------------+---------+-----------+----------+--------------+     Summary: BILATERAL: - No evidence of deep vein thrombosis seen in the lower extremities, bilaterally. -No evidence of popliteal cyst, bilaterally.   *See table(s) above for measurements and observations. Electronically signed by Lemar Livings MD on 07/15/2023 at 1:11:09 PM.    Final    Recent Labs    07/14/23 0246 07/15/23 0655  WBC 10.5 9.9  HGB 11.4* 12.5*  HCT 33.8* 38.2*  PLT 305 340   Recent Labs    07/14/23 0246 07/15/23 0655  NA 132* 138  K 4.0 4.3  CL 97* 104  CO2 23 26  GLUCOSE 139* 148*  BUN 16 19  CREATININE 1.35* 1.75*  CALCIUM 8.4* 8.5*    Intake/Output Summary (Last 24 hours) at 07/16/2023 1212 Last data filed at 07/16/2023 0815 Gross per 24 hour  Intake 245 ml  Output 10 ml  Net 235 ml        Physical Exam: Vital Signs Blood pressure 121/73, pulse 74, temperature 97.8 F (36.6 C), temperature source Oral, resp. rate 16, height 6' (1.829 m), weight 84.3 kg, SpO2 95%.  Constitutional: No distress . Vital signs reviewed. HEENT: NCAT, EOMI, oral membranes moist Neck: supple Cardiovascular: RRR without murmur. No JVD    Respiratory/Chest: CTA  Bilaterally without wheezes or rales. Normal effort    GI/Abdomen: BS +, non-tender, non-distended. Biliary drain full of dark green fluid. Ext: no clubbing, cyanosis, or edema Psych: pleasant and cooperative  Skin: C/D/I. No apparent lesions.  Drain site intact   MSK:      No apparent deformity.       Neurologic exam:  Cognition: AAO to person, place, time and event.  Can follow simple commands, but difficulty with higher level cognitive tasks or concentration. Some delays in processing Insight: Poor but improving overall insight into current condition.  Mood: Pleasant affect, appropriate mood.  Sensation: To light touch intact in BL UEs and LEs  CN: 2-12 grossly intact.  Coordination:  Bilateral upper extremity ataxia, left greater than right.  Strength: 5 out of 5 strength throughout bilateral upper extremities 5 out of 5 strength in right lower extremity 4+ out of 5 strength throughout left lower extremity   Assessment/Plan: 1. Functional deficits which require 3+ hours per day of interdisciplinary therapy in a comprehensive inpatient rehab setting. Physiatrist is providing close team supervision and 24 hour management of active medical problems listed below. Physiatrist and rehab team continue to assess barriers to discharge/monitor patient progress toward functional and medical goals  Care Tool:  Bathing  Bathing activity did not occur: Refused Body parts bathed by patient: Face   Body parts bathed by helper: Right arm, Left arm, Chest, Abdomen, Front perineal area, Buttocks, Right upper leg, Left upper leg, Left lower leg  Bathing assist Assist Level: Total Assistance - Patient < 25%     Upper Body Dressing/Undressing Upper body dressing   What is the patient wearing?: Button up shirt    Upper body assist Assist Level: Moderate Assistance - Patient 50 - 74%    Lower Body Dressing/Undressing Lower body dressing      What is the patient wearing?: Pants,  Underwear/pull up     Lower body assist Assist for lower body dressing: Maximal Assistance - Patient 25 - 49%     Toileting Toileting Toileting Activity did not occur (Clothing management and hygiene only): N/A (no void or bm)  Toileting assist       Transfers Chair/bed transfer  Transfers assist     Chair/bed transfer assist level: Moderate Assistance - Patient 50 - 74%     Locomotion Ambulation   Ambulation assist      Assist level: 2 helpers Assistive device: Other (comment) (3 musketeers and/or use of R UE on rail) Max distance: 5'   Walk 10 feet activity   Assist  Walk 10 feet activity did not occur: Safety/medical concerns        Walk 50 feet activity   Assist Walk 50 feet with 2 turns activity did not occur: Safety/medical concerns         Walk 150 feet activity   Assist Walk 150 feet activity did not occur: Safety/medical concerns         Walk 10 feet on uneven surface  activity   Assist Walk 10 feet on uneven surfaces activity did not occur: Safety/medical concerns         Wheelchair     Assist Is the patient using a wheelchair?: Yes Type of Wheelchair: Manual (TIS)    Wheelchair assist level: Total Assistance - Patient < 25%      Wheelchair 50 feet with 2 turns activity    Assist        Assist Level: Total Assistance - Patient < 25%   Wheelchair 150 feet activity     Assist      Assist Level: Dependent - Patient 0%   Blood pressure 121/73, pulse 74, temperature 97.8 F (36.6 C), temperature source Oral, resp. rate 16, height 6' (1.829 m), weight 84.3 kg, SpO2 95%.  1. 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  Antithrombotics: -DVT/anticoagulation:  Pharmaceutical:  Heparin             -antiplatelet therapy: Aspirin and Plavix for three weeks followed by aspirin alone>>last dose of Plavix 8/30.    3. Pain Management: chronic back pain -Tylenol as needed             -continue tramadol 50 mg TID   4. Mood/Behavior/Sleep/delirium: LCSW to evaluate and provide emotional support             -continue trazodone 100 mg q HS             -antipsychotic agents: continue Zyprexa 5 mg daily             -Last night, got as needed Haldol for attempts to get out of bed  and severe agitation; much calmer and more appropriate today, in general has been doing better, will place on telemetry with Posey belt  9-2: slept well   -will use tele-sitter instead of posey belt 5. Neuropsych/cognition: This patient is not quite capable of making decisions on his own behalf.   6. Skin/Wound Care: Routine skin care checks   7. Fluids/Electrolytes/Nutrition: Routine Is and Os and follow-up chemistries             -dys 3 with thin liquids             -SLP eval   8: Hypertension: monitor TID and prn (home Cozaar 25 mg daily held)             -continue metoprolol 50 mg BID   -Blood pressure well-controlled.    07/16/2023    5:05 AM 07/15/2023    7:49 PM 07/15/2023    1:05 PM  Vitals with BMI  Systolic 121 160 528  Diastolic 73 67 60  Pulse 74 86 62    9: Hyperlipidemia: statin held (follow-up LFTs and restart atorvastatin 40 mg as appropriate)   10: Hypothyroidism: continue Synthroid   11: Acute cholecystitis s/p cholecystostomy tube             -routine drain care             -continue Levaquin 250 mg daily   12: DM-2: CBGs QID; A1c = 8.8%; on insulin at home             -continue SSI   13: Hx of CAD s/p CABG 2016: on Plavix, asa, BB; ? resume statin   14: CKD IIIa: baseline Cr ~1.3             -follow-up BMP  9/2 -AKI   continue to follow Cr. None ordered today==recheck tomorrow   15: GI prophylaxis: continue Protonix   16.  Bowel and bladder incontinence.     PVRs.   -last bm 9/1 LOS: 2 days A FACE TO FACE EVALUATION WAS PERFORMED  Ranelle Oyster 07/16/2023, 12:12 PM

## 2023-07-16 NOTE — Progress Notes (Signed)
Attempted again to get pt to void before bladder scan. Pt states he does not like using the urinal and refuses to be transferred to toilet. Pt states, "I can't pee just because you are telling me to do it."

## 2023-07-16 NOTE — Evaluation (Signed)
Speech Language Pathology Assessment and Plan  Patient Details  Name: Hunter Sparks MRN: [id number] Date of Birth: 1946-01-10  SLP Diagnosis: Cognitive Impairments  Rehab Potential: Good ELOS: 3 weeks    Today's Date: 07/16/2023 SLP Individual Time: 4098-1191 SLP Individual Time Calculation (min): 60 min   Hospital Problem: Principal Problem:   Acute CVA (cerebrovascular accident) [facility]  Past Medical History:  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HCC)    Arthritis    "back, hips, legs" (06/10/2015)   Chest pain    Chronic lower back pain    Depression    DJD (degenerative joint disease)    Hyperlipidemia 06/11/2015   Hypertension    Hypothyroidism    Kidney stone    Kidney stone    Type II diabetes mellitus (HCC)    Past Surgical History:  Past Surgical History:  Procedure Laterality Date   CARDIAC CATHETERIZATION N/A 06/11/2015   Procedure: Left Heart Cath and Coronary Angiography;  Surgeon: Lyn Records, MD;  Location: [facility] INVASIVE CV LAB;  Service: Cardiovascular;  Laterality: N/A;   COLONOSCOPY  06/21/2011   Procedure: COLONOSCOPY;  Surgeon: Malissa Hippo, MD;  Location: [facility] ENDO SUITE;  Service: Endoscopy;  Laterality: N/A;   COLONOSCOPY     COLONOSCOPY N/A 09/20/2017   Procedure: COLONOSCOPY;  Surgeon: Malissa Hippo, MD;  Location: [facility] ENDO SUITE;  Service: Endoscopy;  Laterality: N/A;  1200   CORONARY ARTERY BYPASS GRAFT N/A 06/14/2015   Procedure: CORONARY ARTERY BYPASS GRAFTING times four using Left Internal mammary artery and right leg Saphenous vein graft;  Surgeon: Kerin Perna, MD;  Location: [facility] OR;  Service: Open Heart Surgery;  Laterality: N/A;   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  Admitted to CIR on 8/10. On 07/02/2023 had findings of some cognitive changes with nausea vomiting right upper quadrant discomfort. Transferred to acute TRH service. CT c/w acute cholecystitis. General surgery and IR consulted for cholecystostomy drain and this was placed 8/20. Operative cultures positive for Klebsiella, patient treated with IV Rocephin. Hospital stay has been complicated by poor p.o. intakes, ongoing encephalopathy, type 2 diabetes, AKI on maintenance fluids, and abdominal and back pain. Percutaneous cholecystostomy drain to remain in place at least 6 weeks.   Palliative care consulted on 8/25 for goals of care. Follow-up with patient and spouse on 8/26 and patient has decided on DNR/DNI. The patient requires inpatient medicine and rehabilitation evaluations and services for ongoing dysfunction secondary to scattered small infarct right basal ganglia, frontal, parietal, occipital lobes-MCA/PCA watershed distribution. Patient transferred to CIR on 07/14/2023.  Upon arrival, patient was awake in bed. Patient reported he had a restless night due to discomfort from the bed alarm belt. The alarm belt appeared tight and to have irritated the drain site. Discussed with nursing and physician with plan to discharge the alarm belt at this time and utilize the bed alarm and telesitter.   The patient was not administered  a formal cognitive evaluation but did participate in informal, functional tasks that focused on cognitive functioning. Patient demonstrated difficulty using his phone to locate pictures of choice and to call his wife with overall Mod verbal and visual cues needed. Patient was disoriented to time but independently oriented to  place and situation. Patient also demonstrated appropriate intellectual awareness of deficits and reported intermittent hallucinations which he knows are not real (balloons looking like a small boy in his room). Patient also with decreased sustained attention to tasks but was easily redirected. SLP encouraged patient to eat his breakfast meal, however, patient declined multiple times. Patient observed with medications whole with thin without overt s/s of aspiration.    Patient would benefit from skilled SLP intervention to maximize his cognitive functioning and overall functional independence prior to discharge. Anticipate patient will need 24 hour supervision at discharge.     Skilled Therapeutic Interventions          Administered a cognitive-linguistic evaluation, please see above for details.   SLP Assessment  Patient will need skilled Speech Lanaguage Pathology Services during CIR admission    Recommendations  Oral Care Recommendations: Oral care BID Recommendations for Other Services: Neuropsych consult Patient destination: Home Follow up Recommendations: Home Health SLP;24 hour supervision/assistance Equipment Recommended: None recommended by SLP    SLP Frequency 1 to 3 out of 7 days   SLP Duration  SLP Intensity  SLP Treatment/Interventions 3 weeks  Minumum of 1-2 x/day, 30 to 90 minutes  Cognitive remediation/compensation;Environmental controls;Internal/external aids;Therapeutic Activities;Patient/family education;Functional tasks;Cueing hierarchy    Pain Discomfort in bed Patient repositioned    SLP Evaluation Cognition Overall Cognitive Status: Impaired/Different from baseline Arousal/Alertness: Awake/alert Orientation Level: Oriented to person;Oriented to place;Disoriented to time;Oriented to situation Attention: Sustained Sustained Attention: Impaired Sustained Attention Impairment: Functional basic Memory: Impaired Memory Impairment: Retrieval deficit;Storage  deficit;Decreased short term memory Decreased Short Term Memory: Verbal complex;Functional complex Awareness: Impaired Awareness Impairment: Emergent impairment Problem Solving: Impaired Problem Solving Impairment: Functional complex;Functional basic Executive Function: Initiating;Self Monitoring;Self Correcting Initiating: Impaired Initiating Impairment: Functional basic Self Monitoring: Impaired Self Monitoring Impairment: Functional basic Self Correcting: Impaired Self Correcting Impairment: Functional basic Safety/Judgment: Impaired  Comprehension Auditory Comprehension Overall Auditory Comprehension: Appears within functional limits for tasks assessed Expression Expression Primary Mode of Expression: Verbal Verbal Expression Overall Verbal Expression: Appears within functional limits for tasks assessed Written Expression Dominant Hand: Right Written Expression: Not tested Oral Motor Oral Motor/Sensory Function Overall Oral Motor/Sensory Function: Within functional limits Motor Speech Overall Motor Speech: Appears within functional limits for tasks assessed  Care Tool Care Tool Cognition Ability to hear (with hearing aid or hearing appliances if normally used Ability to hear (with hearing aid or hearing appliances if normally used): 1. Minimal difficulty - difficulty in some environments (e.g. when person speaks softly or setting is noisy)   Expression of Ideas and Wants Expression of Ideas and Wants: 3. Some difficulty - exhibits some difficulty with expressing needs and ideas (e.g, some words or finishing thoughts) or speech is not clear   Understanding Verbal and Non-Verbal Content Understanding Verbal and Non-Verbal Content: 3. Usually understands - understands most conversations, but misses some part/intent of message. Requires cues at times to understand  Memory/Recall Ability Memory/Recall Ability : That he or she is in a hospital/hospital unit;Current season     Short Term Goals: Week 1: SLP Short Term Goal 1 (Week 1): Patient will utilize external aids to orient to time with Min verbal cues. SLP Short Term Goal 2 (  Week 1): Patient will demonstrate functional problem solving for basic and familiar tasks with Min verbal cues. SLP Short Term Goal 3 (Week 1): Patient will demonstrate selective attention in a mildly distracting enviornment to a functional task for 15 minutes with Min verbal cues for redirection. SLP Short Term Goal 4 (Week 1): Patient will utilize memory compensatory strategies to recall information during structured tasks with 75% accuracy and Min verbal and visual cues.  Refer to Care Plan for Long Term Goals  Recommendations for other services: Neuropsych  Discharge Criteria: Patient will be discharged from SLP if patient refuses treatment 3 consecutive times without medical reason, if treatment goals not met, if there is a change in medical status, if patient makes no progress towards goals or if patient is discharged from hospital.  The above assessment, treatment plan, treatment alternatives and goals were discussed and mutually agreed upon: by patient  Georgia Delsignore 07/16/2023, 12:25 PM

## 2023-07-16 NOTE — Progress Notes (Signed)
Occupational Therapy Session Note  Patient Details  Name: Hunter Sparks MRN: [id number] Date of Birth: [birth year]  Today's Date: 07/16/2023 OT Individual Time: 4132-4401 OT Individual Time Calculation (min): 73 min    Short Term Goals: Week 1:  OT Short Term Goal 1 (Week 1): Patient will don pull over or front opening shirt with min assist OT Short Term Goal 2 (Week 1): Patient will identify need to void, and trasnfer to commode with min assist OT Short Term Goal 3 (Week 1): Patient will don lower body clothing with mod assist OT Short Term Goal 4 (Week 1): Patient will stand and free one hand at a time to help manage pants up or down during toileting OT Short Term Goal 5 (Week 1): Patient will transfer sit to stand with min assist in preparation for transfer  Skilled Therapeutic Interventions/Progress Updates:    Pt greeted semi-reclined in bed awake, alert, and agreeable to OT treatment session focused on self-care retraining, sitting/standing balance, and midline orientation. Worked on threading pants at bed level with min A.Pt able to bridge to get pants up most of the way. Max A for bed mobility, then pt needed mod to max A for sitting balance with frequent posterior Lob and LOB to the L. Stedy use for max A transfer from bed to TIS wc. Utilized mirror feedbeck while perched in [place] to promote more midline posture with pt able to fix to get close to midline. Pt sat down into TIS wc and completed UB bathing/dressing with min A and cues for clothing orientation and mod to max A for sitting balance even in chair as he was leaning all the way over the arm rest on the L side. Pt brought to the gym in TIS and worked on trunk activation and sitting balance with cone reaching activity to the R. Sit<>stands at high-low table with max A and facilitation for upright posture. OT placed cones on R side of table to facilitate trunk activation and decreased pushing to the L. Pt returned to room and left  seated in TIS wc with alarm belt on, call bell in reach, and needs met.   Therapy Documentation Precautions:  Precautions Precautions: Fall Precaution Comments: left hemiparesis, pusher Restrictions Weight Bearing Restrictions: No Other Position/Activity Restrictions: Rt sided percutaneous drain Pain: Pain Assessment Pain Scale: 0-10 Pain Score: 0-No pain    Therapy/Group: Individual Therapy  [name] 07/16/2023, 10:17 AM

## 2023-07-17 DIAGNOSIS — I10 Essential (primary) hypertension: Secondary | ICD-10-CM | POA: Diagnosis not present

## 2023-07-17 DIAGNOSIS — I639 Cerebral infarction, unspecified: Secondary | ICD-10-CM | POA: Diagnosis not present

## 2023-07-17 DIAGNOSIS — N1831 Chronic kidney disease, stage 3a: Secondary | ICD-10-CM | POA: Diagnosis not present

## 2023-07-17 DIAGNOSIS — K8001 Calculus of gallbladder with acute cholecystitis with obstruction: Secondary | ICD-10-CM | POA: Diagnosis not present

## 2023-07-17 LAB — BASIC METABOLIC PANEL
Anion gap: 7 (ref 5–15)
BUN: 19 mg/dL (ref 8–23)
CO2: 26 mmol/L (ref 22–32)
Calcium: 8.6 mg/dL — ABNORMAL LOW (ref 8.9–10.3)
Chloride: 101 mmol/L (ref 98–111)
Creatinine, Ser: 1.54 mg/dL — ABNORMAL HIGH (ref 0.61–1.24)
GFR, Estimated: 46 mL/min — ABNORMAL LOW (ref >60)
Glucose, Bld: 146 mg/dL — ABNORMAL HIGH (ref 70–99)
Potassium: 4.3 mmol/L (ref 3.5–5.1)
Sodium: 134 mmol/L — ABNORMAL LOW (ref 135–145)

## 2023-07-17 LAB — GLUCOSE, CAPILLARY
Glucose-Capillary: 142 mg/dL — ABNORMAL HIGH (ref 70–99)
Glucose-Capillary: 181 mg/dL — ABNORMAL HIGH (ref 70–99)
Glucose-Capillary: 134 mg/dL — ABNORMAL HIGH (ref 70–99)
Glucose-Capillary: 132 mg/dL — ABNORMAL HIGH (ref 70–99)

## 2023-07-17 MED ORDER — TRAMADOL HCL 50 MG PO TABS
50.0000 mg | ORAL_TABLET | Freq: Four times a day (QID) | ORAL | Status: DC | PRN
  Administered 2023-07-20 – 2023-07-31 (×3): 50 mg via ORAL
  Filled 2023-07-17 (×5): qty 1

## 2023-07-17 MED ORDER — ACETAMINOPHEN 325 MG PO TABS
650.0000 mg | ORAL_TABLET | Freq: Four times a day (QID) | ORAL | Status: DC | PRN
  Administered 2023-07-19 – 2023-07-23 (×2): 650 mg via ORAL
  Filled 2023-07-17 (×2): qty 2

## 2023-07-17 MED ORDER — TRAZODONE HCL 50 MG PO TABS
50.0000 mg | ORAL_TABLET | Freq: Every day | ORAL | Status: DC
  Administered 2023-07-17 – 2023-07-20 (×4): 50 mg via ORAL
  Filled 2023-07-17 (×4): qty 1

## 2023-07-17 NOTE — Progress Notes (Signed)
Occupational Therapy Session Note  Patient Details  Name: Hunter Sparks MRN: [id number] Date of Birth: [birth year]  Today's Date: 07/18/2023 OT Individual Time: 4098-1191 OT Individual Time Calculation (min): 55 min    Short Term Goals: Week 1:  OT Short Term Goal 1 (Week 1): Patient will don pull over or front opening shirt with min assist OT Short Term Goal 2 (Week 1): Patient will identify need to void, and trasnfer to commode with min assist OT Short Term Goal 3 (Week 1): Patient will don lower body clothing with mod assist OT Short Term Goal 4 (Week 1): Patient will stand and free one hand at a time to help manage pants up or down during toileting OT Short Term Goal 5 (Week 1): Patient will transfer sit to stand with min assist in preparation for transfer  Skilled Therapeutic Interventions/Progress Updates:    Patient agreeable to participate in OT session. Reports a headache and right hip pain during session. No pain score provided. Monitored during session and modified tasks as needed to accommodate pain.   Patient participated in skilled OT session focusing on ADL re-training, functional transfers, and sit to stand transitions.  Pt declined shower this AM when offered. Stated that he would shower when he gets home.  Bed mobility: Pt required Mod-Max A in order to transition from supine to sitting EOB. HOB elevated. VC provided for technique including using bed rails to transition. Physical assist provided for bringing trunk off HOB and slide left leg off EOB. Mod A initially required to maintain sitting balance. Once positioned correctly, pt was able to maintain static sitting balance with SBA.  Functional transfers: Pt completed sit to stand while holding horizontal bar on Stedy with Mod A. VC provided for technique. Transfer from bed to TIS completed with Total A utilizing Stedy.    UB bathing: Pt complete while seated requiring set-up.   LB bathing: Pt able to complete bathing  peri area while seated. Utilizing stedy to stand, total assist provided to wash buttocks.   UB dressing: Pt complete while seated requiring set-up.   LB dressing: Pt complete while seated in TIS and standing using Stedy requiring Max A. Pt educated on compensatory techniques such as bringing foot up to him either using a figure 4 position or placing foot up on step.   Grooming: Pt complete while seated to comb hair requiring Set-up. VC to initiate task. Pt declined to complete oral care with dentures.     Therapy Documentation Precautions:  Precautions Precautions: Fall Precaution Comments: left hemiparesis, pusher Restrictions Weight Bearing Restrictions: No Other Position/Activity Restrictions: Rt sided percutaneous drain  Therapy/Group: Individual Therapy  Limmie Patricia, OTR/L,CBIS  Supplemental OT - [facility] and [facility] Secure Chat Preferred   07/18/2023, 7:58 AM

## 2023-07-17 NOTE — Patient Care Conference (Signed)
Inpatient RehabilitationTeam Conference and Plan of Care Update Date:07/17/2023   Time: 10:58 AM   Patient Name: Hunter Sparks      Medical Record Number: [id number]  Date of Birth: [birth year] Sex: Male         Room/Bed: 4W20C/4W20C-01 Payor Info: Payor: [employer] / Plan: [employer] PPO / Product Type: *No Product type* /    Admit Date/Time:  07/14/2023  5:03 PM  Primary Diagnosis:  Acute CVA (cerebrovascular accident) [facility])  Hospital Problems: Principal Problem:   Acute CVA (cerebrovascular accident) [facility])    Expected Discharge Date: Expected Discharge Date:  (3 weeks)  Team Members Present: Physician leading conference: Dr. Faith Rogue Social Worker Present: Cecile Sheerer, LCSWA Nurse Present: Vedia Pereyra, RN PT Present: Malachi Pro, PT OT Present: Kearney Hard, OT SLP Present: Feliberto Gottron, SLP PPS Coordinator present : Edson Snowball, PT     Current Status/Progress Goal Weekly Team Focus  Bowel/Bladder   Pt is continent/incontinent of bowel/bladder   Pt will gain full continence of bowel/bladder   Will assess qshift and PRN    Swallow/Nutrition/ Hydration               ADL's   Max A Stedy transfers, pushing stronger than before, poor midline orientation   Min A   barrier is pushing and wife can not provide physical assist, self-care retraining, activity tolerance, pusher syndrom    Mobility   minA bed mob, modA transfers via stedy, maxAx2 ambulation with swedish walker 68'   min assist transfers, supervision WC mob  barriers: poor insight into deficits, decreased caregiver assist. Continue working on postural control in sitting/standing, gait training, transfer training, LLE NMR    Communication   significant voice deficits with low vocal intensity impacting speech intelligibility, although wife reports this is baseline            Safety/Cognition/ Behavioral Observations  Min-ModA   Supervision   orientation and  recall with use of strategies/memory aids, selective attention, functional problem solving    Pain   Pt denies pain   Pt will continue to deny pain   Will assess qshift and PRN    Skin   Pt has redness to buttocks   Pt's buttocks will not have skin breakdown  Will assess qshift and PRN      Discharge Planning:  D/c remains home with wife who will be primary caregiver. Wife is unable to provide any physical assistance due to COPD. She would like him to be able to ambulate with an AD. SW will confirm there are no barriers to discharge.   Team Discussion: Acute CVA. Tele-sitter for safety. Time toiling to improve continence. Addressing constipation. Denies pain. Skin is clean, dry, and intact. Encouraging fluids. PO Levaquin for Cholecystitis. Documenting output from bilary drain every shift. Tolerating D3 diet with poor PO intake. Max assist with ADLs, pushing more and a lot weaker compared to previous stay. Mod assist with stedy and using [place] walker.  Patient on target to meet rehab goals: Patient continues to make progress with an estimated 3 week stay.   *See Care Plan and progress notes for long and short-term goals.   Revisions to Treatment Plan:  Medication adjustments.  Watching blood pressure. Monitoring sodium. Monitor labs/VS Teaching Needs: Medications, safety, self care, gait/transfer training, skin care, etc.   Current Barriers to Discharge: Decreased caregiver support, Incontinence, and Behavior  Possible Resolutions to Barriers: Family education Regain control of bowel/bladder Self monitoring of behaviors in  the evening Order recommended DME     Medical Summary Current Status: cva, metabolic encephalopathy with sundowning. bowels moving. biliary tube in abdomen functioning. bilateral foot drop, d3 thin diet  Barriers to Discharge: Medical stability   Possible Resolutions to [employer] Focus: daily recording of drain output, observation of vs and pt  data, adjusting plan as needed.   Continued Need for Acute Rehabilitation Level of Care: The patient requires daily medical management by a physician with specialized training in physical medicine and rehabilitation for the following reasons: Direction of a multidisciplinary physical rehabilitation program to maximize functional independence : Yes Medical management of patient stability for increased activity during participation in an intensive rehabilitation regime.: Yes Analysis of laboratory values and/or radiology reports with any subsequent need for medication adjustment and/or medical intervention. : Yes   I attest that I was present, lead the team conference, and concur with the assessment and plan of the team.   Jearld Adjutant 07/17/2023, 10:58 AM

## 2023-07-17 NOTE — Progress Notes (Signed)
Patient ID: Hunter Sparks, male   DOB: 1946/09/07, 77 y.o.   MRN: [id number]  SW met with pt and pt wife in room to provide updates from team conference, and inform on ELOS 3 weeks and will establish discharge date next week. Wife reports she would like the medication that is causing him to be tired and drowsy to be removed. SW informed medical team.   Cecile Sheerer, MSW, LCSWA Office: [phone] Cell: [phone] Fax: [phone]

## 2023-07-17 NOTE — Progress Notes (Signed)
Occupational Therapy Session Note  Patient Details  Name: Hunter Sparks MRN: [id number] Date of Birth: 12-21-1945  Today's Date: 07/17/2023 OT Individual Time: 1115-1200 OT Individual Time Calculation (min): 45 min    Short Term Goals: Week 1:  OT Short Term Goal 1 (Week 1): Patient will don pull over or front opening shirt with min assist OT Short Term Goal 2 (Week 1): Patient will identify need to void, and trasnfer to commode with min assist OT Short Term Goal 3 (Week 1): Patient will don lower body clothing with mod assist OT Short Term Goal 4 (Week 1): Patient will stand and free one hand at a time to help manage pants up or down during toileting OT Short Term Goal 5 (Week 1): Patient will transfer sit to stand with min assist in preparation for transfer  Skilled Therapeutic Interventions/Progress Updates:    Pt greeted in TIS wc asleep. Per spouse, pt has been very lethargic this morning after getting medication to help him sleep last night, Pt able to wake enough to agree to therapy. OT brought him to the sink to wash his hands and face with set-up A. Pt brought to [employer]pay gym and completed 5 minutes on [employer] arm bike on level 1, then 2 minutes going backwards on level 1 before reaching max fatigue and having to stop. Addressed sit<>stands in [place] with pt needed max facilitation for upright hips and trunk, then 5 sit<>stands perched on stedy seat for practice on body mechanics for stand. Pt left in TIS wc at end of session with alarm belt on, call bell in reach, and spouse present.   Therapy Documentation Precautions:  Precautions Precautions: Fall Precaution Comments: left hemiparesis, pusher Restrictions Weight Bearing Restrictions: No Other Position/Activity Restrictions: Rt sided percutaneous drain Pain: Pain Assessment Pain Scale: 0-10 Pain Score: 0-No pain   Therapy/Group: Individual Therapy  [name] 07/17/2023, 12:16 PM

## 2023-07-17 NOTE — Progress Notes (Signed)
Inpatient Rehabilitation  Patient information reviewed and entered into eRehab system by Melissa M. Bowie, M.A., CCC/SLP, PPS Coordinator.  Information including medical coding, functional ability and quality indicators will be reviewed and updated through discharge.    

## 2023-07-17 NOTE — Care Management (Signed)
Inpatient Rehabilitation Center Individual Statement of Services  Patient Name:  LOUIE ODEGAARD  Date:  07/17/2023  Welcome to the Inpatient Rehabilitation Center.  Our goal is to provide you with an individualized program based on your diagnosis and situation, designed to meet your specific needs.  With this comprehensive rehabilitation program, you will be expected to participate in at least 3 hours of rehabilitation therapies Monday-Friday, with modified therapy programming on the weekends.  Your rehabilitation program will include the following services:  Physical Therapy (PT), Occupational Therapy (OT), Speech Therapy (ST), 24 hour per day rehabilitation nursing, Therapeutic Recreaction (TR), Psychology, Neuropsychology, Care Coordinator, Rehabilitation Medicine, Nutrition Services, Pharmacy Services, and Other  Weekly team conferences will be held on Tuesdays to discuss your progress.  Your Inpatient Rehabilitation Care Coordinator will talk with you frequently to get your input and to update you on team discussions.  Team conferences with you and your family in attendance may also be held.  Expected length of stay: 3 weeks    Overall anticipated outcome: Minimal Assistance  Depending on your progress and recovery, your program may change. Your Inpatient Rehabilitation Care Coordinator will coordinate services and will keep you informed of any changes. Your Inpatient Rehabilitation Care Coordinator's name and contact numbers are listed  below.  The following services may also be recommended but are not provided by the Inpatient Rehabilitation Center:  Driving Evaluations Home Health Rehabiltiation Services Outpatient Rehabilitation Services Vocational Rehabilitation   Arrangements will be made to provide these services after discharge if needed.  Arrangements include referral to agencies that provide these services.  Your insurance has been verified to be:  [employer]  Your primary doctor is:  Nita Sells  Pertinent information will be shared with your doctor and your insurance company.  Inpatient Rehabilitation Care Coordinator:  Susie Cassette [phone] or ([phone]  Information discussed with and copy given to patient by: Gretchen Short, 07/17/2023, 9:55 AM

## 2023-07-17 NOTE — Progress Notes (Signed)
I had a lengthy discussion with patient's wife (30 minutes) reviewing rehab course and adjustments to medication. Encouraged her to get plenty of rest herself and arrange her schedule around his therapy schedule. Her son visits after work most days. She is very appreciative of assistance.

## 2023-07-17 NOTE — IPOC Note (Signed)
Overall Plan of Care [facility]) Patient Details Name: Hunter Sparks MRN: [id number] DOB: 09/12/1946  Admitting Diagnosis: Acute CVA (cerebrovascular accident) [facility]) right basal ganglia cva  Hospital Problems: Principal Problem:   Acute CVA (cerebrovascular accident) [facility])     Functional Problem List: Nursing Bowel, Endurance, Perception, Safety, Skin Integrity, Medication Management, Nutrition, Pain  PT Balance, Behavior, Endurance, Motor, Perception, Safety, Sensory  OT Balance, Motor, Sensory, Cognition, Vision, Perception, Endurance, Safety  SLP Cognition  TR         Basic ADL's: OT Bathing, Dressing, Toileting     Advanced  ADL's: OT       Transfers: PT Bed Mobility, Bed to Chair, Customer service manager, Tub/Shower     Locomotion: PT Ambulation, Stairs, Wheelchair Mobility     Additional Impairments: OT    SLP Social Cognition   Attention, Awareness, Problem Solving, Memory  TR      Anticipated Outcomes Item Anticipated Outcome  Self Feeding supervision  Swallowing      Basic self-care  Min assist  Toileting  Min assist   Bathroom Transfers Min assist  Bowel/Bladder  continent with resolution of constipation and urinary retention  Transfers  CGA  Locomotion  CGA  Communication     Cognition  Supervision  Pain  less than 3  Safety/Judgment  no falls   Therapy Plan: PT Intensity: Minimum of 1-2 x/day ,45 to 90 minutes PT Frequency: 5 out of 7 days PT Duration Estimated Length of Stay: 3-4 weeks OT Intensity: Minimum of 1-2 x/day, 45 to 90 minutes OT Frequency: 5 out of 7 days OT Duration/Estimated Length of Stay: 3 weeks SLP Intensity: Minumum of 1-2 x/day, 30 to 90 minutes SLP Frequency: 1 to 3 out of 7 days SLP Duration/Estimated Length of Stay: 3 weeks   Team Interventions: Nursing Interventions Patient/Family Education, Pain Management, Medication Management, Discharge Planning, Bowel Management, Skin Care/Wound Management, Disease  Management/Prevention  PT interventions Ambulation/gait training, Discharge planning, Functional mobility training, Psychosocial support, Therapeutic Activities, Visual/perceptual remediation/compensation, Balance/vestibular training, Disease management/prevention, Neuromuscular re-education, Skin care/wound management, Therapeutic Exercise, Wheelchair propulsion/positioning, Cognitive remediation/compensation, DME/adaptive equipment instruction, Pain management, Splinting/orthotics, UE/LE Strength taining/ROM, Community reintegration, Development worker, international aid stimulation, Patient/family education, Museum/gallery curator, UE/LE Coordination activities  OT Interventions Warden/ranger, Therapeutic Exercise, UE/LE Coordination activities, Self Care/advanced ADL retraining, UE/LE Strength taining/ROM, Therapeutic Activities, Functional mobility training, Patient/family education, Neuromuscular re-education, Cognitive remediation/compensation, Discharge planning, DME/adaptive equipment instruction, Visual/perceptual remediation/compensation, Wheelchair propulsion/positioning  SLP Interventions Cognitive remediation/compensation, Environmental controls, Internal/external aids, Therapeutic Activities, Patient/family education, Functional tasks, Cueing hierarchy  TR Interventions    SW/CM Interventions     Barriers to Discharge MD  Medical stability  Nursing Decreased caregiver support, Home environment access/layout, Wound Care, Medication compliance, Other (comments) (adherence of appropriate diet) 1 level 3 ste no rails  PT Inaccessible home environment, Home environment access/layout, Decreased caregiver support, Behavior, Lack of/limited family support    OT      SLP      SW       Team Discharge Planning: Destination: PT-Home ,OT- Home , SLP-Home Projected Follow-up: PT-Home health PT, OT-  Home health OT, Outpatient OT, SLP-Home Health SLP, 24 hour supervision/assistance Projected Equipment  Needs: PT-To be determined, OT- To be determined, SLP-None recommended by SLP Equipment Details: PT- , OT-  Patient/family involved in discharge planning: PT- Patient,  OT-Patient, SLP-Patient  MD ELOS: 3 weeks Medical Rehab Prognosis:  Excellent Assessment: The patient has been admitted for CIR therapies with the diagnosis of right basal  ganglia cva, delirium. The team will be addressing functional mobility, strength, stamina, balance, safety, adaptive techniques and equipment, self-care, bowel and bladder mgt, patient and caregiver education, NMR, cognition , community reentry, pain mgt. Goals have been set at min assist for ADL's and supervision for mobility and cognition. Anticipated discharge destination is home with wife.        See Team Conference Notes for weekly updates to the plan of care

## 2023-07-17 NOTE — Progress Notes (Signed)
Physical Therapy Session Note  Patient Details  Name: Hunter Sparks MRN: [id number] Date of Birth: 1946-08-04  Today's Date: 07/17/2023 PT Individual Time: 0805-0902 PT Individual Time Calculation (min): 57 min   Short Term Goals: Week 1:  PT Short Term Goal 1 (Week 1): pt will maintain dynamic seated balance with mod A PT Short Term Goal 2 (Week 1): Pt will perform bed to chair transfer with LRAD and mod A PT Short Term Goal 3 (Week 1): pt will perform sit to stand with LRAD and mod A PT Short Term Goal 4 (Week 1): pt will ambulated 20 feet with LRAD and max A  Skilled Therapeutic Interventions/Progress Updates: Patient supine in bed (HOB elevated) on entrance to room. Patient alert and agreeable to PT session.   Patient reported 8/10 pain on R LE (posterior thigh musculature). Beginning interventions focused on decreasing that pain as indicated in TE  Therapeutic Activity: Bed Mobility: Pt performed supine<>sit on EOB with heavy modA to get to R sidelying. VC required for pt to use L UE to pull self over via [facility] R rail and to flex L knee to assist in bridging hips over. Pt then cued to get to sitting EOB with mod/heavy  modA and VC to use B UE's to assist in truncal elevation by pushing into bed. Transfers: Pt performed squat pivot transfers throughout session with maxA. Provided VC for hand and B LE placement and head/hip relationship. Pt transferred from [facility] to TIS at end of session for comfort with maxA + 1 (pt required increased time and effort as pt pushes into STEDY bar vs pull). Pt then maxA to control descent, and to posteriorly scoot.   Neuromuscular Re-ed: NMR facilitated during session with focus on proprioceptive feedback on B LE during functional transfers. - contract-relax supine in bed with pt cued to push R LE into PTA hand, and to relax to stretch R LE hamstrings per pt report of pain at beginning of PT session. Multiple bouts performed with pt noted to have decreased pain  (unrated).  - L LE contract relax in short sitting to increase muscle fiber recruitment and neuro connection in way of knee extension (PTA with min manual resistance). Pt noted to show increased strength after several repetitions, but fatigued quickly after. - Pt performed 2 x  sit to stands from edge of mat +2 (PTA on L and rehab tech on R). Pt cued to place B UE's in hands of PTA and tech as to avoid pushing back (pt pushes posteriorly to the L, but can correct with multimodal cues). Pt with maxA + 2 with PTA facilitating hip extension and tactile cues for pt to extend at hips, and tactile cues to activate quads as pt remains in knee flexion (L>R). Pt able to extend through hips after cues, and at quads, but increased effort and assistance required to obtain full knee extension in stance.  - Pt performed 4 x mini squats with PTA on L and rehab tech on R (B HHA). Max cues for pt to power through B LE's in front of mirror for midline orientation (maxA)  NMR performed for improvements in motor control and coordination, balance, sequencing, judgement, and self confidence/ efficacy in performing all aspects of mobility at highest level of independence.   Patient sitting in TIS at end of session with brakes locked, belt alarm set, and all needs within reach.      Therapy Documentation Precautions:  Precautions Precautions: Fall Precaution Comments: left  hemiparesis, pusher Restrictions Weight Bearing Restrictions: No Other Position/Activity Restrictions: Rt sided percutaneous drain   Therapy/Group: Individual Therapy  Zaveon Gillen PTA 07/17/2023, 12:49 PM

## 2023-07-17 NOTE — Progress Notes (Incomplete)
Initial Nutrition Assessment  DOCUMENTATION CODES:   Severe malnutrition in context of acute illness/injury  INTERVENTION:  - Discontinue Ensure Enlive po BID, each supplement provides 350 kcal and 20 grams of protein.  - Add MVI q day.   - Will add chocolate milk to meal trays.   NUTRITION DIAGNOSIS:   Severe Malnutrition related to acute illness as evidenced by moderate muscle depletion, energy intake < or equal to 50% for > or equal to 5 days.  GOAL:   Patient will meet greater than or equal to 90% of their needs  MONITOR:   PO intake, Weight trends  REASON FOR ASSESSMENT:   Malnutrition Screening Tool    ASSESSMENT:   77 y.o. 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 PMH includes: angina, arthritis, depression, DJD, HLD, HTN, hypothyroidism, T2DM.  Meds reviewed: sliding scale insulin, synthroid. Labs reviewed: Na low, creatinine elevated.   The pt is currently on a Dys 3 diet. Pt is oriented x2. Pt reports that he has not been eating well because he does not like any of the food here at the hospital. Per record, pt's intakes have averaged at about 44% over the past 7 days. He refused all supplements. He does report that he likes chocolate milk.  RD will add this to all meal trays. Per record, pt has experienced a 16% wt loss in less than 1 month, which is significant.   RD will continue to closely monitor PO intakes. RD discussed poor nutritional status with MD. If PO intakes do not improve, there may need to be a goals of care discussion around nutrition support.    NUTRITION - FOCUSED PHYSICAL EXAM:  Flowsheet Row Most Recent Value  Orbital Region Mild depletion  Upper Arm Region Mild depletion  Thoracic and Lumbar Region Unable to assess  Buccal Region Mild depletion  Temple Region Moderate depletion  Clavicle Bone Region Moderate depletion  Clavicle and Acromion Bone Region Moderate  depletion  Scapular Bone Region Unable to assess  Dorsal Hand Moderate depletion  Patellar Region Moderate depletion  Anterior Thigh Region Moderate depletion  Posterior Calf Region Moderate depletion  Edema (RD Assessment) None  Hair Reviewed  Eyes Reviewed  Mouth Reviewed  Skin Reviewed  Nails Reviewed       Diet Order:   Diet Order             DIET DYS 3 Room service appropriate? Yes with Assist; Fluid consistency: Thin  Diet effective now                   EDUCATION NEEDS:   Not appropriate for education at this time  Skin:  Skin Assessment: Reviewed RN Assessment  Last BM:  9/5 - type 6  Height:   Ht Readings from Last 1 Encounters:  07/14/23 6' (1.829 m)    Weight:   Wt Readings from Last 1 Encounters:  07/14/23 84.3 kg    Ideal Body Weight:     BMI:  Body mass index is 25.21 kg/m.  Estimated Nutritional Needs:   Kcal:  2100-2500 kcals  Protein:  105-125 gm  Fluid:  >/= 2.1 L  Bethann Humble, RD, LDN, CNSC.

## 2023-07-17 NOTE — Progress Notes (Signed)
PROGRESS NOTE   Subjective/Complaints:  Pt with PT in w/c. Doesn't like telesitter. Otherwise ok. Stopped focusing on it once we diverted attention.   ROS: Patient denies fever, rash, sore throat, blurred vision, dizziness, nausea, vomiting, diarrhea, cough, shortness of breath or chest pain, joint or back/neck pain, headache, or mood change.   Objective:   VAS [place] LOWER EXTREMITY VENOUS (DVT)  Result Date: 07/15/2023  Lower Venous DVT Study Patient Name:  Hunter Sparks  Date of Exam:   07/15/2023 Medical Rec #: [id number]         Accession #:    [id number] Date of Birth: January 02, 1946         Patient Gender: M Patient Age:   23 years Exam Location:  [facility] Procedure:      VAS [place] LOWER EXTREMITY VENOUS (DVT) Referring Phys: Wendi Maya --------------------------------------------------------------------------------  Indications: Pain, and Edema.  Risk Factors: Immobility. Comparison Study: No prior study Performing Technologist: Shona Simpson  Examination Guidelines: A complete evaluation includes B-mode imaging, spectral Doppler, color Doppler, and power Doppler as needed of all accessible portions of each vessel. Bilateral testing is considered an integral part of a complete examination. Limited examinations for reoccurring indications may be performed as noted. The reflux portion of the exam is performed with the patient in reverse Trendelenburg.  +---------+---------------+---------+-----------+----------+--------------+ RIGHT    CompressibilityPhasicitySpontaneityPropertiesThrombus Aging +---------+---------------+---------+-----------+----------+--------------+ CFV      Full           Yes      Yes                                 +---------+---------------+---------+-----------+----------+--------------+ SFJ      Full                                                         +---------+---------------+---------+-----------+----------+--------------+ FV Prox  Full                                                        +---------+---------------+---------+-----------+----------+--------------+ FV Mid   Full                                                        +---------+---------------+---------+-----------+----------+--------------+ FV DistalFull                                                        +---------+---------------+---------+-----------+----------+--------------+ PFV      Full                                                        +---------+---------------+---------+-----------+----------+--------------+  POP      Full           Yes      Yes                                 +---------+---------------+---------+-----------+----------+--------------+ PTV      Full                                                        +---------+---------------+---------+-----------+----------+--------------+ PERO     Full                                                        +---------+---------------+---------+-----------+----------+--------------+   +---------+---------------+---------+-----------+----------+--------------+ LEFT     CompressibilityPhasicitySpontaneityPropertiesThrombus Aging +---------+---------------+---------+-----------+----------+--------------+ CFV      Full           Yes      Yes                                 +---------+---------------+---------+-----------+----------+--------------+ SFJ      Full                                                        +---------+---------------+---------+-----------+----------+--------------+ FV Prox  Full                                                        +---------+---------------+---------+-----------+----------+--------------+ FV Mid   Full                                                         +---------+---------------+---------+-----------+----------+--------------+ FV DistalFull                                                        +---------+---------------+---------+-----------+----------+--------------+ PFV      Full                                                        +---------+---------------+---------+-----------+----------+--------------+ POP      Full           Yes      Yes                                 +---------+---------------+---------+-----------+----------+--------------+  PTV      Full                                                        +---------+---------------+---------+-----------+----------+--------------+ PERO     Full                                                        +---------+---------------+---------+-----------+----------+--------------+     Summary: BILATERAL: - No evidence of deep vein thrombosis seen in the lower extremities, bilaterally. -No evidence of popliteal cyst, bilaterally.   *See table(s) above for measurements and observations. Electronically signed by Lemar Livings MD on 07/15/2023 at 1:11:09 PM.    Final    Recent Labs    07/15/23 0655  WBC 9.9  HGB 12.5*  HCT 38.2*  PLT 340   Recent Labs    07/15/23 0655 07/17/23 0721  NA 138 134*  K 4.3 4.3  CL 104 101  CO2 26 26  GLUCOSE 148* 146*  BUN 19 19  CREATININE 1.75* 1.54*  CALCIUM 8.5* 8.6*    Intake/Output Summary (Last 24 hours) at 07/17/2023 1214 Last data filed at 07/17/2023 0748 Gross per 24 hour  Intake 543 ml  Output 300 ml  Net 243 ml        Physical Exam: Vital Signs Blood pressure (!) 150/67, pulse 78, temperature 98 F (36.7 C), resp. rate 19, height 6' (1.829 m), weight 84.3 kg, SpO2 95%.  Constitutional: No distress . Vital signs reviewed. HEENT: NCAT, EOMI, oral membranes moist Neck: supple Cardiovascular: RRR without murmur. No JVD    Respiratory/Chest: CTA Bilaterally without wheezes or rales. Normal effort     GI/Abdomen: BS +, non-tender, non-distended, drain in place RUQ Ext: no clubbing, cyanosis, or edema Psych: pleasant and cooperative  Skin: C/D/I. No apparent lesions.  Drain site intact   MSK:      No apparent deformity.       Neurologic exam:  Cognition: AAO to person, place, time and event. Poor insight. Can perseverate. Can be distracted by environment Mood: Pleasant affect, appropriate mood.  Sensation: To light touch intact in BL UEs and LEs  CN: 2-12 grossly intact.  Coordination:  Bilateral upper extremity ataxia, left greater than right.  Strength: 5 out of 5 strength throughout bilateral upper extremities 5 out of 5 strength in right lower extremity 4+ out of 5 strength throughout left lower extremity   Assessment/Plan: 1. Functional deficits which require 3+ hours per day of interdisciplinary therapy in a comprehensive inpatient rehab setting. Physiatrist is providing close team supervision and 24 hour management of active medical problems listed below. Physiatrist and rehab team continue to assess barriers to discharge/monitor patient progress toward functional and medical goals  Care Tool:  Bathing  Bathing activity did not occur: Refused Body parts bathed by patient: Face   Body parts bathed by helper: Right arm, Left arm, Chest, Abdomen, Front perineal area, Buttocks, Right upper leg, Left upper leg, Left lower leg     Bathing assist Assist Level: Total Assistance - Patient < 25%     Upper Body Dressing/Undressing Upper body dressing   What is the patient wearing?:  Button up shirt    Upper body assist Assist Level: Moderate Assistance - Patient 50 - 74%    Lower Body Dressing/Undressing Lower body dressing      What is the patient wearing?: Pants, Underwear/pull up     Lower body assist Assist for lower body dressing: Maximal Assistance - Patient 25 - 49%     Toileting Toileting Toileting Activity did not occur (Clothing management and hygiene  only): N/A (no void or bm)  Toileting assist       Transfers Chair/bed transfer  Transfers assist     Chair/bed transfer assist level: Moderate Assistance - Patient 50 - 74% (stedy)     Locomotion Ambulation   Ambulation assist      Assist level: 2 helpers Assistive device: Other (comment) (eva walker) Max distance: 46'   Walk 10 feet activity   Assist  Walk 10 feet activity did not occur: Safety/medical concerns  Assist level: 2 helpers Assistive device:  (eva walker)   Walk 50 feet activity   Assist Walk 50 feet with 2 turns activity did not occur: Safety/medical concerns  Assist level: 2 helpers Assistive device:  (eva walker)    Walk 150 feet activity   Assist Walk 150 feet activity did not occur: Safety/medical concerns         Walk 10 feet on uneven surface  activity   Assist Walk 10 feet on uneven surfaces activity did not occur: Safety/medical concerns         Wheelchair     Assist Is the patient using a wheelchair?: Yes Type of Wheelchair: Manual (TIS)    Wheelchair assist level: Total Assistance - Patient < 25%      Wheelchair 50 feet with 2 turns activity    Assist        Assist Level: Total Assistance - Patient < 25%   Wheelchair 150 feet activity     Assist      Assist Level: Dependent - Patient 0%   Blood pressure (!) 150/67, pulse 78, temperature 98 F (36.7 C), resp. rate 19, height 6' (1.829 m), weight 84.3 kg, SpO2 95%.  1. 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 Interdisciplinary team conference today to discuss goals, barriers to discharge, and dc planning.   2.  Antithrombotics: -DVT/anticoagulation:  Pharmaceutical:  Heparin             -antiplatelet therapy: Aspirin and Plavix for three weeks followed by aspirin alone>>last dose of Plavix 8/30.    3. Pain Management: chronic back pain -Tylenol as needed             -continue tramadol 50 mg TID   4. Mood/Behavior/Sleep/delirium: LCSW to evaluate and provide emotional support             -continue trazodone 100 mg q HS             -antipsychotic agents: continue Zyprexa 5 mg daily             -Last night, got as needed Haldol for attempts to get out of bed and severe agitation;  much calmer and more appropriate today, in general has been doing better, will place on telemetry with Posey belt  9/3: seems to be sleeping ok   -use tele-sitter instead of posey belt 5. Neuropsych/cognition: This patient is not quite capable of making decisions on his own behalf.   6. Skin/Wound Care: Routine skin care checks   7. Fluids/Electrolytes/Nutrition: Routine Is and Os and follow-up chemistries             -dys 3 with thin liquids             -SLP eval   8: Hypertension: monitor TID and prn (home Cozaar 25 mg daily held)             -continue metoprolol 50 mg BID   -Blood pressure well-controlled.    07/17/2023    5:45 AM 07/16/2023    7:40 PM 07/16/2023    2:57 PM  Vitals with BMI  Systolic 150 144 578  Diastolic 67 56 57  Pulse 78 57 63    9: Hyperlipidemia: statin held (follow-up LFTs and restart atorvastatin 40 mg as appropriate)   10: Hypothyroidism: continue Synthroid   11: Acute cholecystitis s/p cholecystostomy tube             -routine drain care. Site is fine             -continue Levaquin 250 mg daily   12: DM-2: CBGs QID; A1c = 8.8%; on insulin at home             -continue SSI   13: Hx of CAD s/p CABG 2016: on Plavix, asa, BB; ? resume statin   14: CKD IIIa: baseline Cr ~1.3             -follow-up BMP  9/3--Cr 1.54, near baseline---no changes  -encourage appropriate fluids  15: GI prophylaxis: continue Protonix   16.  Bowel and  bladder incontinence.    PVRs.   -last bm 9/1 LOS: 3 days A FACE TO FACE EVALUATION WAS PERFORMED  Ranelle Oyster 07/17/2023, 12:15 PM

## 2023-07-17 NOTE — Discharge Instructions (Addendum)
Inpatient Rehab Discharge Instructions  Hunter Sparks Discharge date and time:  08/04/2023  Activities/Precautions/ Functional Status: Activity: no lifting, driving, or strenuous exercise until cleared by MD Diet: cardiac diet and diabetic diet Wound Care: routine catheter care Functional status:  ___ No restrictions     ___ Walk up steps independently _x__ 24/7 supervision/assistance   ___ Walk up steps with assistance ___ Intermittent supervision/assistance  ___ Bathe/dress independently ___ Walk with walker     ___ Bathe/dress with assistance ___ Walk Independently    ___ Shower independently _x__ Walk with assistance    _x__ Shower with assistance ___ No alcohol     ___ Return to work/school ________  Special Instructions: No driving, alcohol consumption or tobacco use.  Recommend checking fingerstick blood sugars four (4) times daily and record. Bring this information with you to follow-up appointment with PCP.  Recommend daily BP measurement in same arm and record time of day. Bring this information with you to follow-up appointment with PCP.    COMMUNITY REFERRALS UPON DISCHARGE:    Home Health:   PT     OT    ST    RN    SNA                  Agency: [facility]       Phone: [phone] *Please expect follow-up within 2-3 days to schedule your home visit. If you have not received follow-up, be sure to contact the branch directly.*   Medical Equipment/Items Ordered:wheelchair                                                 Agency/Supplier:[facility] [phone]     STROKE/TIA DISCHARGE INSTRUCTIONS SMOKING Cigarette smoking nearly doubles your risk of having a stroke & is the single most alterable risk factor  If you smoke or have smoked in the last 12 months, you are advised to quit smoking for your health. Most of the excess cardiovascular risk related to smoking disappears within a year of stopping. Ask you doctor about anti-smoking medications [place]  Quit Line: 1-800-QUIT NOW Free Smoking Cessation Classes [phone]  CHOLESTEROL Know your levels; limit fat & cholesterol in your diet  Lipid Panel     Component Value Date/Time   CHOL 226 (H) 06/21/2023 0320   TRIG 139 06/21/2023 0320   HDL 33 (L) 06/21/2023 0320   CHOLHDL 6.8 06/21/2023 0320   VLDL 28 06/21/2023 0320   LDLCALC 165 (H) 06/21/2023 0320   LDLCALC 177 (H) 01/03/2018 0739     Many patients benefit from treatment even if their cholesterol is at goal. Goal: Total Cholesterol (CHOL) less than 160 Goal:  Triglycerides (TRIG) less than 150 Goal:  HDL greater than 40 Goal:  LDL (LDLCALC) less than 100   BLOOD PRESSURE American Stroke Association blood pressure target is less that 120/80 mm/Hg  Your discharge blood pressure is:  BP: (!) 147/73 Monitor your blood pressure Limit your salt and alcohol intake Many individuals will require more than one medication for high blood pressure  DIABETES (A1c is a blood sugar average for last 3 months) Goal HGBA1c is under 7% (HBGA1c is blood sugar average for last 3 months)  Diabetes: Diagnosis of diabetes:  Your A1c:8.8 %    Lab Results  Component Value Date   HGBA1C 8.8 (H)  06/21/2023    Your HGBA1c can be lowered with medications, healthy diet, and exercise. Check your blood sugar as directed by your physician Call your physician if you experience unexplained or low blood sugars.  PHYSICAL ACTIVITY/REHABILITATION Goal is 30 minutes at least 4 days per week  Activity: Increase activity slowly, Therapies: Physical Therapy: Home Health, Occupational Therapy: Home Health, and Speech Therapy: Home Health Return to work: n/a Activity decreases your risk of heart attack and stroke and makes your heart stronger.  It helps control your weight and blood pressure; helps you relax and can improve your mood. Participate in a regular exercise program. Talk with your doctor about the best form of exercise for you (dancing, walking,  swimming, cycling).  DIET/WEIGHT Goal is to maintain a healthy weight  Your discharge diet is:  Diet Order             Diet regular Room service appropriate? Yes; Fluid consistency: Thin  Diet effective now                  thin liquids Your height is:  Height: 6' (182.9 cm) Your current weight is: Weight: 75 kg Your Body Mass Index (BMI) is:  BMI (Calculated): 22.42 Following the type of diet specifically designed for you will help prevent another stroke. Your goal weight range is:   Your goal Body Mass Index (BMI) is 19-24. Healthy food habits can help reduce 3 risk factors for stroke:  High cholesterol, hypertension, and excess weight.  RESOURCES Stroke/Support Group:  Call [phone]   STROKE EDUCATION PROVIDED/REVIEWED AND GIVEN TO PATIENT Stroke warning signs and symptoms How to activate emergency medical system (call 911). Medications prescribed at discharge. Need for follow-up after discharge. Personal risk factors for stroke. Pneumonia vaccine given: No Flu vaccine given: No My questions have been answered, the writing is legible, and I understand these instructions.  I will adhere to these goals & educational materials that have been provided to me after my discharge from the hospital.      My questions have been answered and I understand these instructions. I will adhere to these goals and the provided educational materials after my discharge from the hospital.  Patient/Caregiver Signature _______________________________ Date __________  Clinician Signature _______________________________________ Date __________  Please bring this form and your medication list with you to all your follow-up doctor's appointments.   Interventional Radiology Percutaneous Abscess Drain Placement After Care   This sheet gives you information about how to care for yourself after your procedure. Your health care provider may also give you more specific instructions. Your drain was  placed by an interventional radiologist with [facility]. If you have questions or concerns, contact [facility] at [phone].   What is a percutaneous drain?   A drain is a small plastic tube (catheter) that goes into the fluid collection in your body through your skin.   How long will I need the drain?   How long the drain needs to stay in is determined by where the drain is, how much comes out of the drain each day and if you are having any other surgical procedures.   Interventional radiology will determine when it is time to remove the drain. It is important to follow up as directed so that the drain can be removed as soon as it is safe to do so.   What can I expect after the procedure?   After the procedure, it is common to have:   A  small amount of bruising and discomfort in the area where the drainage tube (catheter) was placed.   Sleepiness and fatigue. This should go away after the medicines you were given have worn off.   Follow these instructions at home:   Insertion site care   Check your insertion site when you change the bandage. Check for:   More redness, swelling, or pain.   More fluid or blood.   Warmth.   Pus or a bad smell.   When caring for your insertion site:   Wash your hands with soap and water for at least 20 seconds before and after you change your bandage (dressing). If soap and water are not available, use hand sanitizer.   You do not need to change your dressing everyday if it is clean and dry. Change your dressing every 3 days or as needed when it is soiled, wet or becoming dislodged. You will need to change your dressing each time you shower.   Leave stitches (sutures), skin glue, or adhesive strips in place. These skin closures may need to stay in place for 2 weeks or longer. If adhesive strip edges start to loosen and curl up, you may trim the loose edges. Do not remove adhesive strips completely unless your health care  provider tells you to do so.   Catheter care   Flush the catheter once per day with 5 mL of 0.9% normal saline unless you are told otherwise by your healthcare provider. This helps to prevent clogs in the catheter.   To disconnect the drain, turn the clear plastic tube to the left. Attach the saline syringe by placing it on the white end of the drain and turning gently to the right. Once attached gently push the plunger to the 5 mL mark. After you are done flushing, disconnect the syringe by turning to the left and reattach your drainage container   If you have a bulb please be sure the bulb is charged after reconnecting it - to do this pinch the bulb between your thumb and first finger and close the stopper located on the top of the bulb.    Check for fluid leaking from around your catheter (instead of fluid draining through your catheter). This may be a sign that the drain is no longer working correctly.   Write down the following information every time you empty your bag:   The date and time.   The amount of drainage.   Activity   Rest at home for 1-2 days after your procedure.   For the first 48 hours do not lift anything more than 10 lbs (about a gallon of milk). You may perform moderate activities/exercise. Please avoid strenuous activities during this time.   Avoid any activities which may pull on your drain as this can cause your drain to become dislodged.   If you were given a sedative during the procedure, it can affect you for several hours. Do not drive or operate machinery until your health care provider says that it is safe.   General instructions   For mild pain take over-the-counter medications as needed for pain such as Tylenol or Advil. If you are experiencing severe pain please call our office as this may indicate an issue with your drain.    If you were prescribed an antibiotic medicine, take it as told by your health care provider. Do not stop using the antibiotic  even if you start to feel better.   You may shower  24 hours after the drain is placed. To do this cover the insertion site with a water tight material such as saran wrap and seal the edges with tape, you may also purchase waterproof dressings at your local drug store. Shower as usual and then remove the water tight dressing and any gauze/tape underneath it once you have exited the shower and dried off. Allow the area to air dry or pat dry with a clean towel. Once the skin is completely dry place a new gauze dressing. It is important to keep the site dry at all times to prevent infection.   Do not submerge the drain - this means you cannot take baths, swim, use a hot tub, etc. until the drain is removed.    Do not use any products that contain nicotine or tobacco, such as cigarettes, e-cigarettes, and chewing tobacco. If you need help quitting, ask your health care provider.   Keep all follow-up visits as told by your health care provider. This is important.   Contact a health care provider if:   You have less than 10 mL of drainage a day for 2-3 days in a row, or as directed by your health care provider.   You have any of these signs of infection:   More redness, swelling, or pain around your incision area.   More fluid or blood coming from your incision area.   Warmth coming from your incision area.   Pus or a bad smell coming from your incision area.   You have fluid leaking from around your catheter (instead of through your catheter).   You are unable to flush the drain.   You have a fever or chills.   You have pain that does not get better with medicine.   You have not been contacted to schedule a drain follow up appointment within 10 days of discharge from the hospital.   Please call [facility] at [phone] with any questions or concerns.   Get help right away if:   Your catheter comes out.   You suddenly stop having drainage from your catheter.   You  suddenly have blood in the fluid that is draining from your catheter.   You become dizzy or you faint.   You develop a rash.   You have nausea or vomiting.   You have difficulty breathing or you feel short of breath.   You develop chest pain.   You have problems with your speech or vision.   You have trouble balancing or moving your arms or legs.   Summary   It is common to have a small amount of bruising and discomfort in the area where the drainage tube (catheter) was placed. You may also have minor discomfort with movement while the drain is in place.   Flush the drain once per day with 5 mL of 0.9% normal saline (unless you were told otherwise by your healthcare provider).    Record the amount of drainage from the bag every time you empty it.   Change the dressing every 3 days or earlier if soiled/wet. Keep the skin dry under the dressing.   You may shower with the drain in place. Do not submerge the drain (no baths, swimming, hot tubs, etc.).   Contact [facility] at [phone] if you have more redness, swelling, or pain around your incision area or if you have pain that does not get better with medicine.   This information is not intended to  replace advice given to you by your health care provider. Make sure you discuss any questions you have with your health care provider.   Document Revised: 02/02/2022 Document Reviewed: 10/25/2019   Elsevier Patient Education  2023 Elsevier Inc[employer]         Interventional Radiology Drain Record   Empty your drain at least once per day. You may empty it as often as needed. Use this form to write down the amount of fluid that has collected in the drainage container. Bring this form with you to your follow-up visits. Please call [facility] at [phone] with any questions or concerns prior to your appointment.   Drain #1 location: ___________________   Date __________ Time __________ Amount __________    Date __________ Time __________ Amount __________   Date __________ Time __________ Amount __________   Date __________ Time __________ Amount __________   Date __________ Time __________ Amount __________   Date __________ Time __________ Amount __________   Date __________ Time __________ Amount __________   Date __________ Time __________ Amount __________   Date __________ Time __________ Amount __________   Date __________ Time __________ Amount __________   Date __________ Time __________ Amount __________   Date __________ Time __________ Amount __________   Date __________ Time __________ Amount __________   Date __________ Time __________ Amount __________    Percutaneous cholecystostomy drain to remain in place at least 6 weeks.   Recommend fluoroscopy with injection of the drain in IR to evaluate for patency of the cystic duct.  If the duct is patent and general surgery feels patient is stable for cholecystectomy, the drain would be removed at time of surgery.  If the duct is patent and general surgery feels patient is NEVER a candidate for cholecystectomy, drain can be capped for a trial.  If symptoms recur, then place to gravity bag again.  If trial is successful, discuss possible removal of the drain.  If trial in unsuccessful, then patient will need routine exchanges of the  chole tube about every 8-10 weeks.

## 2023-07-17 NOTE — Progress Notes (Signed)
Speech Language Pathology Daily Session Note  Patient Details  Name: Hunter Sparks MRN: [id number] Date of Birth: 05-14-1946  Today's Date: 07/17/2023 SLP Individual Time: 1349-1445 SLP Individual Time Calculation (min): 56 min  Short Term Goals: Week 1: SLP Short Term Goal 1 (Week 1): Patient will utilize external aids to orient to time with Min verbal cues. SLP Short Term Goal 2 (Week 1): Patient will demonstrate functional problem solving for basic and familiar tasks with Min verbal cues. SLP Short Term Goal 3 (Week 1): Patient will demonstrate selective attention in a mildly distracting enviornment to a functional task for 15 minutes with Min verbal cues for redirection. SLP Short Term Goal 4 (Week 1): Patient will utilize memory compensatory strategies to recall information during structured tasks with 75% accuracy and Min verbal and visual cues.  Skilled Therapeutic Interventions: SLP conducted skilled therapy session targeting cognitive retraining. Received patient in room, partially reclined in TIS chair. Wife at bedside reports no changes from baseline cognition since admitting event, however does report transient decrease in thinking skills this date 2* administration of trazedone. Wife reports she has alerted medical team to this concern and per RN, MD is addressing. Patient exhibits reduced participation in therapy tasks and upon prompting, promotes that he "doesn't care" about doing anything because his wife has "babied him all his life, and (he) likes it that way." Reportedly, wife manages all appointments, medications, cooking, cleaning, and yard work with patient reporting no daily responsibilities. SLP attempted to instill motivation to patient via incentive to assist wife with ADLs, however attempts were seemingly unsuccessful. SLP transferred patient to speech therapy office to attempt to improve engagement to task. During cognitive tasks, patient sorted cards by suit given minA.  SLP introduced patient to [facility] memory strategies, however after 2 minute distracted delay, patient recalled 0/4. To end session, patient requested transfer back to bed. SLP and RN utilized stedy +2 to transfer patient back to bed with patient following 1-step directions given visual and gestural cues accurately and consistently. Patient was left in lowered bed with call bell in reach and bed alarm set. SLP will continue to target goals per plan of care.       Pain Pain Assessment Pain Scale: 0-10 Pain Score: 8  Pain Location: Buttocks  Therapy/Group: Individual Therapy  Jeannie Done, M.A., CCC-SLP  [name] 07/17/2023, 2:13 PM

## 2023-07-17 NOTE — Progress Notes (Signed)
Physical Therapy Session Note  Patient Details  Name: Hunter Sparks MRN: [id number] Date of Birth: 1946/03/18  Today's Date: 07/17/2023 PT Individual Time: 1032-1100 PT Individual Time Calculation (min): 28 min   Short Term Goals: Week 1:  PT Short Term Goal 1 (Week 1): pt will maintain dynamic seated balance with mod A PT Short Term Goal 2 (Week 1): Pt will perform bed to chair transfer with LRAD and mod A PT Short Term Goal 3 (Week 1): pt will perform sit to stand with LRAD and mod A PT Short Term Goal 4 (Week 1): pt will ambulated 20 feet with LRAD and max A  Skilled Therapeutic Interventions/Progress Updates:  Patient seated upright with back tilt while in TIS w/c  on entrance to room. Patient initially asleep but roused with some effort and agreeable to PT session. Wife present and relates that pt is more fatigued this day. Concerned re: pt's change in alertness.   Patient with no pain complaint at start of session.  Therapeutic Activity: Transfers: Attempted sit<>stand training with push to Bil armrests. X4 forward w/c pushups with L hand hold to armrest. Attempts for rise to stand and pt continually stepping R foot forward throughout attempt for rise to stand. Provided with education and visual demonstration for forward lean required to bring body weight over feet in order to improve transfers. Pt responds with "are you sure?" Continued attempts with pt continuing to step R foot forward. However, pt fatigued and demos eye closing on return to seated position.   Wife asking re: medications with potential to increase lethargy/ fatigue. RN notified with request to see wife for her concerns.   Patient seated in TIS w/c at end of session with brakes locked, increased posterior tilt to w/c for positioning, bed alarm set, and all needs within reach. Wife present.    Therapy Documentation Precautions:  Precautions Precautions: Fall Precaution Comments: left hemiparesis,  pusher Restrictions Weight Bearing Restrictions: No Other Position/Activity Restrictions: Rt sided percutaneous drain  Pain: No pain related to therapist during this session.   Therapy/Group: Individual Therapy  Loel Dubonnet PT, DPT, CSRS 07/17/2023, 5:41 PM

## 2023-07-18 DIAGNOSIS — N1831 Chronic kidney disease, stage 3a: Secondary | ICD-10-CM | POA: Diagnosis not present

## 2023-07-18 DIAGNOSIS — E1122 Type 2 diabetes mellitus with diabetic chronic kidney disease: Secondary | ICD-10-CM | POA: Diagnosis not present

## 2023-07-18 DIAGNOSIS — Z794 Long term (current) use of insulin: Secondary | ICD-10-CM

## 2023-07-18 DIAGNOSIS — I639 Cerebral infarction, unspecified: Secondary | ICD-10-CM | POA: Diagnosis not present

## 2023-07-18 DIAGNOSIS — M25561 Pain in right knee: Secondary | ICD-10-CM

## 2023-07-18 DIAGNOSIS — I10 Essential (primary) hypertension: Secondary | ICD-10-CM | POA: Diagnosis not present

## 2023-07-18 DIAGNOSIS — K5900 Constipation, unspecified: Secondary | ICD-10-CM

## 2023-07-18 LAB — GLUCOSE, CAPILLARY
Glucose-Capillary: 134 mg/dL — ABNORMAL HIGH (ref 70–99)
Glucose-Capillary: 182 mg/dL — ABNORMAL HIGH (ref 70–99)
Glucose-Capillary: 149 mg/dL — ABNORMAL HIGH (ref 70–99)
Glucose-Capillary: 180 mg/dL — ABNORMAL HIGH (ref 70–99)

## 2023-07-18 MED ORDER — POLYETHYLENE GLYCOL 3350 17 G PO PACK
17.0000 g | PACK | Freq: Every day | ORAL | Status: DC
  Administered 2023-07-18 – 2023-07-19 (×2): 17 g via ORAL
  Filled 2023-07-18 (×2): qty 1

## 2023-07-18 MED ORDER — DICLOFENAC SODIUM 1 % EX GEL
2.0000 g | Freq: Four times a day (QID) | CUTANEOUS | Status: DC
  Administered 2023-07-18 – 2023-07-30 (×21): 2 g via TOPICAL
  Filled 2023-07-18 (×2): qty 100

## 2023-07-18 NOTE — Progress Notes (Signed)
Occupational Therapy Session Note  Patient Details  Name: Hunter Sparks MRN: [id number] Date of Birth: 04-Aug-1946  Today's Date: 07/18/2023 OT Individual Time: 1446-1531 OT Individual Time Calculation (min): 45 min    Short Term Goals: Week 1:  OT Short Term Goal 1 (Week 1): Patient will don pull over or front opening shirt with min assist OT Short Term Goal 2 (Week 1): Patient will identify need to void, and trasnfer to commode with min assist OT Short Term Goal 3 (Week 1): Patient will don lower body clothing with mod assist OT Short Term Goal 4 (Week 1): Patient will stand and free one hand at a time to help manage pants up or down during toileting OT Short Term Goal 5 (Week 1): Patient will transfer sit to stand with min assist in preparation for transfer  Skilled Therapeutic Interventions/Progress Updates:     Pt received deeply sleeping in bed requiring significantly increased time wake with OT calling out Pt's name, lights turned on, blinds open, and tactile stimulation through sternal rub. Upon waking, Pt presenting to be in pleasant mood receptive to skilled OT session reporting pain in L hip with no pain score provided- OT offering intermittent rest breaks, repositioning, and therapeutic support to optimize participation in therapy session.  Pt dressed and ready for the day upon OT arrival with Pt politely declining need for shower. Pt receptive to getting out of bed and requesting to leave room for therapy session. Pt required max verbal cues and significantly increased amount of time to follow verbal/tactile cues to initiate rolling to R side of bed using log rolling technique. Once sitting EOB, pt able to maintain siting balance with CGA, however Pt would report fatigue and impulsively lay back in bed or allow his trunk to collapse to L side requiring maximal motivation and mod A to return trunk to upright position. Donned Pt's shoes total A for time management. Worked on sit<>stands  using stedy with Pt able to rise to standing position with mod A and mod tactile/verbal cues for safety and attention to task. Transported pt to [facility] via stedy and transported Pt total A to therapy gym for change in environment to support improved moral. Engaged Pt in functional cognition pattern replication task with cross body reaching incorporated into activity to facilitate improved postural alignment. Pt required max verbal cues to complete task for problem solving and maintain attention. Transported Pt back to room total A in TISWC with Pt receptive to staying up following session. Pt was left resting in TISWC with call bell in reach, seat belt alarm on, tele-sitter on, and all needs met.    Therapy Documentation Precautions:  Precautions Precautions: Fall Precaution Comments: left hemiparesis, pusher Restrictions Weight Bearing Restrictions: No Other Position/Activity Restrictions: Rt sided percutaneous drain   Therapy/Group: Individual Therapy  Clide Deutscher 07/18/2023, 3:44 PM

## 2023-07-18 NOTE — Progress Notes (Signed)
Met with patient, wife not in room.  Unable to locate educational binder.  Will stop back by when wife available.  Left updated educational material in [employer].  Will see if she has binder at home. Provided education on ABX/sepsis and diet related to cholecystostomy.  Also discussed A1c again of 8.8. All needs met, call bell in reach.

## 2023-07-18 NOTE — Progress Notes (Signed)
Physical Therapy Session Note  Patient Details  Name: Hunter Sparks MRN: [id number] Date of Birth: 05/18/1946  Today's Date: 07/18/2023 PT Individual Time: 0915-1000 PT Individual Time Calculation (min): 45 min   Short Term Goals: Week 1:  PT Short Term Goal 1 (Week 1): pt will maintain dynamic seated balance with mod A PT Short Term Goal 2 (Week 1): Pt will perform bed to chair transfer with LRAD and mod A PT Short Term Goal 3 (Week 1): pt will perform sit to stand with LRAD and mod A PT Short Term Goal 4 (Week 1): pt will ambulated 20 feet with LRAD and max A  Skilled Therapeutic Interventions/Progress Updates:      Pt seated in TIS WC upon arrival. Pt agreeable to therapy. Pt reports 10/10 thigh pain, upon palpation reports R knee pain on medial aspect of knee and thigh, increases and travels to lateral aspect of thigh with knee extension, and reduces to 8-9/10 with knee flexion. Pt perform active assisted knee flexion/extension x20 with R LE positioned on rolling board. Repositioned to supine in bed per request, pt still reports 8/10 pain. Offered ice and heat pack, pt decliner, offered asking nurse for medication, pt declined.   Pt requesting to lay down. Pt performed steady transfer with +1 heavy mod A, verbal and tactile cues provided for technique. Supine to sit with mod A for B LE's, repositioning in bed with use of bed rails and supervision, verbal cues provided for techniques.   Pt performed the following therex for B LE strengthening: 1x10 active assisted  SLR B, pt initiating L LE only, but needs assist through full ROM, active assisted supine hip abduction B, 1x10 bridges with therapist utilizing active assisted mtoion to position legs in hooklying and providing B LE approximation for improved buttock clearance, however pt having activation but minimal to no buttock clearance.   Pt supine in bed with all needs within reach and bed alarm on.   Therapy  Documentation Precautions:  Precautions Precautions: Fall Precaution Comments: left hemiparesis, pusher Restrictions Weight Bearing Restrictions: No Other Position/Activity Restrictions: Rt sided percutaneous drain  Therapy/Group: Individual Therapy  [facility] [place], [place], DPT  07/18/2023, 7:42 AM

## 2023-07-18 NOTE — Progress Notes (Signed)
PROGRESS NOTE   Subjective/Complaints:  No acute events overnight noted.  Patient sitting in wheelchair working with physical therapy.  Patient reports some right knee pain that started this morning.  ROS: Patient denies fever, rash, sore throat, blurred vision, dizziness, nausea, vomiting, diarrhea, cough, shortness of breath or chest pain, back/neck pain, headache, or mood change.  + Right knee pain  Objective:   No results found. No results for input(s): "WBC", "HGB", "HCT", "PLT" in the last 72 hours.  Recent Labs    07/17/23 0721  NA 134*  K 4.3  CL 101  CO2 26  GLUCOSE 146*  BUN 19  CREATININE 1.54*  CALCIUM 8.6*    Intake/Output Summary (Last 24 hours) at 07/18/2023 0841 Last data filed at 07/18/2023 0729 Gross per 24 hour  Intake 604 ml  Output 550 ml  Net 54 ml        Physical Exam: Vital Signs Blood pressure (!) 156/67, pulse 89, temperature 98.8 F (37.1 C), temperature source Oral, resp. rate 18, height 6' (1.829 m), weight 84.3 kg, SpO2 97%.  Constitutional: No distress . Vital signs reviewed. HEENT: NCAT, EOMI, oral membranes moist Neck: supple Cardiovascular: RRR without murmur. No JVD    Respiratory/Chest: CTA Bilaterally without wheezes or rales. Normal effort    GI/Abdomen: BS +, non-tender, non-distended, drain in place RUQ Ext: no clubbing, cyanosis, or edema Psych: pleasant and cooperative  Skin: C/D/I. No apparent lesions.  Drain site intact   MSK:      No apparent deformity.      Right medial knee with mild tenderness to palpation, pain with PROM of the right knee Neurologic exam:  Cognition: AAO to person, place, time and event. Poor insight. Can perseverate. Can be distracted by environment Mood: Pleasant affect, appropriate mood.  Sensation: To light touch intact in BL UEs and LEs  CN: 2-12 grossly intact.  Coordination:  Bilateral upper extremity ataxia, left greater than  right.  Strength: 5 out of 5 strength throughout bilateral upper extremities 5 out of 5 strength in right lower extremity 4+ out of 5 strength throughout left lower extremity   Assessment/Plan: 1. Functional deficits which require 3+ hours per day of interdisciplinary therapy in a comprehensive inpatient rehab setting. Physiatrist is providing close team supervision and 24 hour management of active medical problems listed below. Physiatrist and rehab team continue to assess barriers to discharge/monitor patient progress toward functional and medical goals  Care Tool:  Bathing  Bathing activity did not occur: Refused Body parts bathed by patient: Face   Body parts bathed by helper: Right arm, Left arm, Chest, Abdomen, Front perineal area, Buttocks, Right upper leg, Left upper leg, Left lower leg     Bathing assist Assist Level: Total Assistance - Patient < 25%     Upper Body Dressing/Undressing Upper body dressing   What is the patient wearing?: Button up shirt    Upper body assist Assist Level: Moderate Assistance - Patient 50 - 74%    Lower Body Dressing/Undressing Lower body dressing      What is the patient wearing?: Pants, Underwear/pull up     Lower body assist Assist for lower body dressing:  Maximal Assistance - Patient 25 - 49%     Toileting Toileting Toileting Activity did not occur (Clothing management and hygiene only): N/A (no void or bm)  Toileting assist Assist for toileting: Moderate Assistance - Patient 50 - 74%     Transfers Chair/bed transfer  Transfers assist     Chair/bed transfer assist level: Moderate Assistance - Patient 50 - 74% (stedy)     Locomotion Ambulation   Ambulation assist      Assist level: 2 helpers Assistive device: Other (comment) (eva walker) Max distance: 57'   Walk 10 feet activity   Assist  Walk 10 feet activity did not occur: Safety/medical concerns  Assist level: 2 helpers Assistive device:  (eva  walker)   Walk 50 feet activity   Assist Walk 50 feet with 2 turns activity did not occur: Safety/medical concerns  Assist level: 2 helpers Assistive device:  (eva walker)    Walk 150 feet activity   Assist Walk 150 feet activity did not occur: Safety/medical concerns         Walk 10 feet on uneven surface  activity   Assist Walk 10 feet on uneven surfaces activity did not occur: Safety/medical concerns         Wheelchair     Assist Is the patient using a wheelchair?: Yes Type of Wheelchair: Manual (TIS)    Wheelchair assist level: Total Assistance - Patient < 25%      Wheelchair 50 feet with 2 turns activity    Assist        Assist Level: Total Assistance - Patient < 25%   Wheelchair 150 feet activity     Assist      Assist Level: Total Assistance - Patient < 25%   Blood pressure (!) 156/67, pulse 89, temperature 98.8 F (37.1 C), temperature source Oral, resp. rate 18, height 6' (1.829 m), weight 84.3 kg, SpO2 97%.  1. 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   2.  Antithrombotics: -DVT/anticoagulation:  Pharmaceutical: Heparin             -antiplatelet therapy: Aspirin and Plavix for three weeks followed by aspirin alone>>last dose of Plavix 8/30.    3. Pain Management: chronic back pain -Tylenol as needed             -continue tramadol 50 mg TID   4. Mood/Behavior/Sleep/delirium: LCSW to evaluate and provide emotional support             -continue trazodone 100 mg q HS             -antipsychotic agents: continue Zyprexa 5 mg daily             -Last night, got as needed Haldol for attempts to get out of bed and severe agitation; much calmer and more  appropriate today, in general has been doing better, will place on telemetry with Posey belt  9/3: seems to be sleeping ok   -use tele-sitter instead of posey belt  5. Neuropsych/cognition: This patient is not quite capable of making decisions on his own behalf.  6. Skin/Wound Care: Routine skin care checks   7. Fluids/Electrolytes/Nutrition: Routine Is and Os and follow-up chemistries             -dys 3 with thin liquids             -SLP eval   8: Hypertension: monitor TID and prn (home Cozaar 25 mg daily held)             -continue metoprolol 50 mg BID   -9/4 Intermittently elevated, continue to monitor trend for now    07/18/2023    4:03 AM 07/17/2023    8:03 PM 07/17/2023    2:46 PM  Vitals with BMI  Systolic 156 146 960  Diastolic 67 64 70  Pulse 89 60 61    9: Hyperlipidemia: statin held (follow-up LFTs and restart atorvastatin 40 mg as appropriate)   10: Hypothyroidism: continue Synthroid   11: Acute cholecystitis s/p cholecystostomy tube             -routine drain care. Site is fine             -continue Levaquin 250 mg daily   12: DM-2: CBGs QID; A1c = 8.8%; on insulin at home             -continue SSI  -9/4 Controlled, continue current regimen   CBG (last 3)  Recent Labs    07/17/23 2116 07/18/23 0615 07/18/23 1149  GLUCAP 132* 134* 182*      13: Hx of CAD s/p CABG 2016: on Plavix, asa, BB; ? resume statin   14: CKD IIIa: baseline Cr ~1.3             -follow-up BMP  9/3--Cr 1.54, near baseline---no changes  -encourage appropriate fluids  9/4 recheck bmp tomorrow  15: GI prophylaxis: continue Protonix   16.  Bowel and bladder incontinence.    PVRs.  -last Bms noted to be smears on 9/2 and 9/1, schedule miralax for constipation  17. R knee pain  -Start voltaren gel, consider xray if not improved by tomorrow  LOS: 4 days A FACE TO FACE EVALUATION WAS PERFORMED  Fanny Dance 07/18/2023, 8:41 AM

## 2023-07-18 NOTE — Progress Notes (Signed)
Inpatient Rehabilitation Care Coordinator Assessment and Plan Patient Details  Name: Hunter Sparks MRN: [id number] Date of Birth: 1945-12-26  Today's Date: 07/18/2023  Hospital Problems: Principal Problem:   Acute CVA (cerebrovascular accident) [facility])  Past Medical History:  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HCC)    Arthritis    "back, hips, legs" (06/10/2015)   Chest pain    Chronic lower back pain    Depression    DJD (degenerative joint disease)    Hyperlipidemia 06/11/2015   Hypertension    Hypothyroidism    Kidney stone    Kidney stone    Type II diabetes mellitus (HCC)    Past Surgical History:  Past Surgical History:  Procedure Laterality Date   CARDIAC CATHETERIZATION N/A 06/11/2015   Procedure: Left Heart Cath and Coronary Angiography;  Surgeon: Lyn Records, MD;  Location: [facility] INVASIVE CV LAB;  Service: Cardiovascular;  Laterality: N/A;   COLONOSCOPY  06/21/2011   Procedure: COLONOSCOPY;  Surgeon: Malissa Hippo, MD;  Location: [facility] ENDO SUITE;  Service: Endoscopy;  Laterality: N/A;   COLONOSCOPY     COLONOSCOPY N/A 09/20/2017   Procedure: COLONOSCOPY;  Surgeon: Malissa Hippo, MD;  Location: [facility] ENDO SUITE;  Service: Endoscopy;  Laterality: N/A;  1200   CORONARY ARTERY BYPASS GRAFT N/A 06/14/2015   Procedure: CORONARY ARTERY BYPASS GRAFTING times four using Left Internal mammary artery and right leg Saphenous vein graft;  Surgeon: Kerin Perna, MD;  Location: [facility] OR;  Service: Open Heart Surgery;  Laterality: N/A;   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 His smoking use included  cigarettes. He started smoking about 64 years ago. He has a 90 pack-year smoking history. His smokeless tobacco use includes chew. He reports that he does not drink alcohol and does not use drugs.  Family / Support Systems Marital Status: Married How Long?: 56 years Patient Roles: Spouse Spouse/Significant Other: [name] (wife) Children: 2 children- Jimmy (lives in [place]), and Granger (lives 6 minutes from their home) Other Supports: N/A Anticipated Caregiver: Wife Ability/Limitations of Caregiver: Wife is the primary caregiver. she states she is not able to physically lift him due to COPD. Caregiver Availability: 24/7 Family Dynamics: Pt lives with his wife.  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 Education: 9th grade Health Literacy - How often do you need to have someone help you when you read instructions, pamphlets, or other written material from your doctor or pharmacy?: Never Writes: Yes Employment Status: Retired Date Retired/Disabled/Unemployed: Retired Marine scientist Issues: Denies Guardian/Conservator: Product manager- wife   Abuse/Neglect Abuse/Neglect Assessment Can Be Completed: Unable to assess, patient is non-responsive or altered mental status Physical Abuse: Denies Verbal Abuse: Denies Sexual Abuse: Denies Exploitation of patient/patient's resources: Denies Self-Neglect: Denies  Patient response to: Social Isolation - How often do you feel lonely or isolated from those around you?: Patient unable to respond  Emotional Status Pt's affect, behavior and adjustment status: Pt presents with flat affect despite sense of  humor. He is confused at times. Recent Psychosocial Issues: Denies Psychiatric History: Denies Substance Abuse History: Denies  Patient / Family Perceptions, Expectations & Goals Pt/Family understanding of illness &  functional limitations: Pt wife has a general understanding of pt care needs Premorbid pt/family roles/activities: Independent Anticipated changes in roles/activities/participation: Assistance with ADLs/IADLs Pt/family expectations/goals: Goal is to work towards getting him as independent as possible with atleast using a RW around the home since wife is unable to do any lifting.  Community Resources [employer]: None Premorbid Home Care/DME Agencies: None Transportation available at discharge: Wife Is the patient able to respond to transportation needs?: Yes In the past 12 months, has lack of transportation kept you from medical appointments or from getting medications?: No In the past 12 months, has lack of transportation kept you from meetings, work, or from getting things needed for daily living?: No Resource referrals recommended: Neuropsychology  Discharge Planning Living Arrangements: Spouse/significant other Support Systems: Spouse/significant other Type of Residence: Private residence Community education officer Resources: Media planner (specify) ([employer]) Financial Resources: Restaurant manager, fast food Screen Referred: No Living Expenses: Own Money Management: Spouse Does the patient have any problems obtaining your medications?: No Home Management: Pt wife manages all home care needs Patient/Family Preliminary Plans: NO changes Care Coordinator Barriers to Discharge: Decreased caregiver support, Lack of/limited family support, Insurance for SNF coverage Care Coordinator Anticipated Follow Up Needs: HH/OP Expected length of stay: 3 weeks  Clinical Impression This SW familiar with pt as previous patient. Pt after going to acute on 8/19 due to continued medial issues. He returned on 8/25.  SW will continue to follow and assess for discharge needs.   Achille Xiang A Illyanna Petillo 07/18/2023, 10:49 AM

## 2023-07-18 NOTE — Progress Notes (Signed)
Speech Language Pathology Daily Session Note  Patient Details  Name: Hunter Sparks MRN: [id number] Date of Birth: 05-Feb-1946  Today's Date: 07/18/2023 SLP Individual Time: 1001-1059 SLP Individual Time Calculation (min): 58 min  Short Term Goals: Week 1: SLP Short Term Goal 1 (Week 1): Patient will utilize external aids to orient to time with Min verbal cues. SLP Short Term Goal 2 (Week 1): Patient will demonstrate functional problem solving for basic and familiar tasks with Min verbal cues. SLP Short Term Goal 3 (Week 1): Patient will demonstrate selective attention in a mildly distracting enviornment to a functional task for 15 minutes with Min verbal cues for redirection. SLP Short Term Goal 4 (Week 1): Patient will utilize memory compensatory strategies to recall information during structured tasks with 75% accuracy and Min verbal and visual cues.  Skilled Therapeutic Interventions: SLP conducted skilled therapy session targeting cognitive retraining. Patient exhibits significant deficits in the areas of safety, judgement, awareness, orientation, and basic problem solving with wife seemingly unaware of frank cognitive changes. SLP spent majority of tasks attempting to increase patient and family awareness of present deficits with limited success. Patient disoriented to date and day of the week as well as reporting difficulty distinguishing between day and night. Wife reports he called her at 2 AM, with patient reporting he thought it was 2 in the afternoon, as "the phone tells a different time depending on what side its on." Patient required totalA to utilize schedule to recall therapy of the morning and therapy scheduled for the afternoon. SLP and patient spent majority of session troubleshooting phone use with patient requiring totalA to locate phone app and call wife x3. Patient observed to call others by accident, open facebook and spotify unintentionally with no awareness of how to get back,  and accidental making of a facebook story. Wife reports patient enjoys watching YouTube. Patient currently requires totalA to utilize search bar and keyboard. Patient further requires totalA spelling basic words, although reportedly this is baseline (though question wife as historian given significantly reduced awareness of current deficits). Patient was left in lowered bed with call bell in reach and bed alarm set. SLP will continue to target goals per plan of care.      Pain Pain Assessment Pain Scale: 0-10 Pain Score: 6  Pain Location: Head  Therapy/Group: Individual Therapy  Jeannie Done, M.A., CCC-SLP  [name] 07/18/2023, 12:42 PM

## 2023-07-19 ENCOUNTER — Telehealth: Payer: HMO | Admitting: Medical

## 2023-07-19 ENCOUNTER — Inpatient Hospital Stay (HOSPITAL_COMMUNITY): Payer: PPO

## 2023-07-19 DIAGNOSIS — I639 Cerebral infarction, unspecified: Secondary | ICD-10-CM | POA: Diagnosis not present

## 2023-07-19 DIAGNOSIS — I10 Essential (primary) hypertension: Secondary | ICD-10-CM | POA: Diagnosis not present

## 2023-07-19 DIAGNOSIS — E1122 Type 2 diabetes mellitus with diabetic chronic kidney disease: Secondary | ICD-10-CM | POA: Diagnosis not present

## 2023-07-19 DIAGNOSIS — N1831 Chronic kidney disease, stage 3a: Secondary | ICD-10-CM | POA: Diagnosis not present

## 2023-07-19 LAB — BASIC METABOLIC PANEL
Anion gap: 9 (ref 5–15)
BUN: 18 mg/dL (ref 8–23)
CO2: 25 mmol/L (ref 22–32)
Calcium: 8.7 mg/dL — ABNORMAL LOW (ref 8.9–10.3)
Chloride: 100 mmol/L (ref 98–111)
Creatinine, Ser: 1.57 mg/dL — ABNORMAL HIGH (ref 0.61–1.24)
GFR, Estimated: 45 mL/min — ABNORMAL LOW (ref >60)
Glucose, Bld: 164 mg/dL — ABNORMAL HIGH (ref 70–99)
Potassium: 4.3 mmol/L (ref 3.5–5.1)
Sodium: 134 mmol/L — ABNORMAL LOW (ref 135–145)

## 2023-07-19 LAB — AEROBIC/ANAEROBIC CULTURE W GRAM STAIN (SURGICAL/DEEP WOUND)

## 2023-07-19 LAB — GLUCOSE, CAPILLARY
Glucose-Capillary: 168 mg/dL — ABNORMAL HIGH (ref 70–99)
Glucose-Capillary: 171 mg/dL — ABNORMAL HIGH (ref 70–99)
Glucose-Capillary: 139 mg/dL — ABNORMAL HIGH (ref 70–99)
Glucose-Capillary: 172 mg/dL — ABNORMAL HIGH (ref 70–99)

## 2023-07-19 MED ORDER — ADULT MULTIVITAMIN W/MINERALS CH
1.0000 | ORAL_TABLET | Freq: Every day | ORAL | Status: DC
  Administered 2023-07-19 – 2023-08-04 (×16): 1 via ORAL
  Filled 2023-07-19 (×17): qty 1

## 2023-07-19 MED ORDER — POLYETHYLENE GLYCOL 3350 17 G PO PACK
17.0000 g | PACK | Freq: Two times a day (BID) | ORAL | Status: DC
  Administered 2023-07-19 – 2023-08-03 (×22): 17 g via ORAL
  Filled 2023-07-19 (×31): qty 1

## 2023-07-19 NOTE — Progress Notes (Signed)
Physical Therapy Session Note  Patient Details  Name: Hunter Sparks MRN: [id number] Date of Birth: [birth year]  Today's Date: 07/19/2023 PT Missed Time: 20 Minutes Missed Time Reason: Other (Comment)-refusal, pt asleep   Short Term Goals: Week 1:  PT Short Term Goal 1 (Week 1): pt will maintain dynamic seated balance with mod A PT Short Term Goal 2 (Week 1): Pt will perform bed to chair transfer with LRAD and mod A PT Short Term Goal 3 (Week 1): pt will perform sit to stand with LRAD and mod A PT Short Term Goal 4 (Week 1): pt will ambulated 20 feet with LRAD and max A  Skilled Therapeutic Interventions/Progress Updates:      Pt asleep in bed upon arrival. Pt attempted to awaken pt with turning lights on, saying pt name loudly, and sternal rub. Intially not even opening eyes, but opened eyes and said "what". Therapist introduced herself, said "no" and closed his eyes again and snoring.   Pt missed 45 minutes of session. Pt supine in bed with bed alarm on and needs within reach and telesitter in room.   Therapy Documentation Precautions:  Precautions Precautions: Fall Precaution Comments: left hemiparesis, pusher Restrictions Weight Bearing Restrictions: No Other Position/Activity Restrictions: Rt sided percutaneous drain  Therapy/Group: Individual Therapy  [facility] [place], [place], DPT  07/19/2023, 7:31 AM

## 2023-07-19 NOTE — Progress Notes (Signed)
Speech Language Pathology Daily Session Note  Patient Details  Name: Hunter Sparks MRN: [id number] Date of Birth: 03/12/1946  Today's Date: 07/19/2023 SLP Individual Time: 1400-1445 SLP Individual Time Calculation (min): 45 min  Short Term Goals: Week 1: SLP Short Term Goal 1 (Week 1): Patient will utilize external aids to orient to time with Min verbal cues. SLP Short Term Goal 2 (Week 1): Patient will demonstrate functional problem solving for basic and familiar tasks with Min verbal cues. SLP Short Term Goal 3 (Week 1): Patient will demonstrate selective attention in a mildly distracting enviornment to a functional task for 15 minutes with Min verbal cues for redirection. SLP Short Term Goal 4 (Week 1): Patient will utilize memory compensatory strategies to recall information during structured tasks with 75% accuracy and Min verbal and visual cues.  Skilled Therapeutic Interventions: SLP conducted skilled therapy session targeting cognitive retraining. SLP wheeled patient in TIS to speech therapy office to change environment and limit distractions. Patient demonstrated significantly improved ability to navigate cellphone, telling time with supervisionA and calling wife with minA. Patient required modA to locate photo application and to locate dog picture once photos were opened. With patient permission, SLP assisted patient in deleting copious accidental selfies taken since admission. SLP guided patient through various verbal problem solving scenarios with patient requiring mod-maxA to identify solutions other than "ask my wife." SLP transferred patient back room and requested LPN assistance to transfer patient back to bed per patient request. Patient followed 1-step directions given visual cues to utilize [place] device. Patient was left in lowered bed with call bell in reach and bed alarm set. SLP will continue to target goals per plan of care.      Pain Pain Assessment Pain Scale: 0-10 Pain  Score: 8  Pain Type: Acute pain Pain Location: Leg Pain Descriptors / Indicators: Aching Pain Frequency: Constant Pain Onset: On-going Pain Intervention(s): Repositioned  Therapy/Group: Individual Therapy  Jeannie Done, M.A., CCC-SLP  [name] 07/19/2023, 3:46 PM

## 2023-07-19 NOTE — Progress Notes (Signed)
Physical Therapy Session Note  Patient Details  Name: Hunter Sparks MRN: [id number] Date of Birth: [birth year]  Today's Date: 07/19/2023 PT Individual Time: 0950-1030 PT Individual Time Calculation (min): 40 min   Short Term Goals: Week 1:  PT Short Term Goal 1 (Week 1): pt will maintain dynamic seated balance with mod A PT Short Term Goal 2 (Week 1): Pt will perform bed to chair transfer with LRAD and mod A PT Short Term Goal 3 (Week 1): pt will perform sit to stand with LRAD and mod A PT Short Term Goal 4 (Week 1): pt will ambulated 20 feet with LRAD and max A  Skilled Therapeutic Interventions/Progress Updates: Pt presented in bed sleeping and requiring significantly increased stimuli to be aroused. Pt lethargic throughout session and required increased stimulation to maintain arousal throughout session. PTA threaded pants total A and was able to roll with supervision to allow PTA to pull pants over hips. Pt was then able to roll to R to complete supine to sit with modA for both BLE management and truncal support. Pt was able to sit EOB with CGA with intermittent cues to maintain midline as pt would tend to lean to L. While pt sitting EOB PTA donned socks and shoes total A for time management. Pt then completed transfer to TIS with use of Stedy and requiring modA to stand with increased time. In TIS pt transported to day room and attempted use of Cybex Kinetron for reciprocal activity. Pt was able to lightly depress pedals however required frequent cues for sustained task with pt only able to perform 1-2 pushes before nodding off. Pt then transported to main gym and set up in parallel bars. Pt was able to scoot anteriorly in TIS with minA and required modA to stand in parallel bars. Pt completed x 2 stands however on second stand pt required increased cues to sustain attention and placed head on this therapist's shoulder. Pt returned to TIS and transported back to room. Pt left in TIS with belt  alarm in place, call bell within reach and needs met.      Therapy Documentation Precautions:  Precautions Precautions: Fall Precaution Comments: left hemiparesis, pusher Restrictions Weight Bearing Restrictions: No Other Position/Activity Restrictions: Rt sided percutaneous drain General: PT Amount of Missed Time (min): 20 Minutes PT Missed Treatment Reason: Other (Comment) Vital Signs: Therapy Vitals Temp: (!) 97.5 F (36.4 C) Pulse Rate: 75 Resp: 17 BP: (!) 119/56 Patient Position (if appropriate): Sitting Oxygen Therapy SpO2: 98 % O2 Device: Room Air Pain: Pain Assessment Pain Scale: 0-10 Pain Score: 9  Pain Type: Acute pain Pain Location: Back Pain Descriptors / Indicators: Aching Pain Frequency: Constant Pain Onset: On-going Pain Intervention(s): Medication (See eMAR) Mobility:   Locomotion :    Trunk/Postural Assessment :    Balance:   Exercises:   Other Treatments:      Therapy/Group: Individual Therapy  Lanaysia Fritchman 07/19/2023, 2:38 PM

## 2023-07-19 NOTE — Progress Notes (Signed)
PROGRESS NOTE   Subjective/Complaints:  Pt lying in bed. Reports his knee feels better today, reports some GI discomfort. . Therapy reported he was drowsy today.   ROS: Patient denies fever, rash, sore throat, blurred vision, dizziness, nausea, vomiting, diarrhea, cough, shortness of breath or chest pain, back/neck pain, headache, or mood change.  + Right knee pain+ improved + GI discomfort  Objective:   No results found. No results for input(s): "WBC", "HGB", "HCT", "PLT" in the last 72 hours.  Recent Labs    07/17/23 0721 07/19/23 0627  NA 134* 134*  K 4.3 4.3  CL 101 100  CO2 26 25  GLUCOSE 146* 164*  BUN 19 18  CREATININE 1.54* 1.57*  CALCIUM 8.6* 8.7*    Intake/Output Summary (Last 24 hours) at 07/19/2023 1344 Last data filed at 07/19/2023 1305 Gross per 24 hour  Intake 652 ml  Output 1150 ml  Net -498 ml        Physical Exam: Vital Signs Blood pressure (!) 119/56, pulse 75, temperature (!) 97.5 F (36.4 C), resp. rate 17, height 6' (1.829 m), weight 84.3 kg, SpO2 98%.  Constitutional: No distress . Vital signs reviewed. HEENT: NCAT, EOMI, oral membranes moist Neck: supple Cardiovascular: RRR without murmur. No JVD    Respiratory/Chest: CTA Bilaterally without wheezes or rales. Normal effort    GI/Abdomen: BS +, non-distended, drain in place RUQ- slight tenderness around this area Ext: no clubbing, cyanosis, or edema Psych: pleasant and cooperative  Skin: C/D/I. No apparent lesions.  Drain site intact   MSK:      No apparent deformity.      Denies R knee tenderness today Neurologic exam:  Cognition: AAO to person, place, time and event. Poor insight. Can perseverate. Can be distracted by environment Mood: Pleasant affect, appropriate mood.  Sensation: To light touch intact in BL UEs and LEs  CN: 2-12 grossly intact.  Coordination:  Bilateral upper extremity ataxia, left greater than right.   Strength: 5 out of 5 strength throughout bilateral upper extremities 5 out of 5 strength in right lower extremity 4+ out of 5 strength throughout left lower extremity   Assessment/Plan: 1. Functional deficits which require 3+ hours per day of interdisciplinary therapy in a comprehensive inpatient rehab setting. Physiatrist is providing close team supervision and 24 hour management of active medical problems listed below. Physiatrist and rehab team continue to assess barriers to discharge/monitor patient progress toward functional and medical goals  Care Tool:  Bathing  Bathing activity did not occur: Refused Body parts bathed by patient: Face   Body parts bathed by helper: Right arm, Left arm, Chest, Abdomen, Front perineal area, Buttocks, Right upper leg, Left upper leg, Left lower leg     Bathing assist Assist Level: Total Assistance - Patient < 25%     Upper Body Dressing/Undressing Upper body dressing   What is the patient wearing?: Button up shirt    Upper body assist Assist Level: Moderate Assistance - Patient 50 - 74%    Lower Body Dressing/Undressing Lower body dressing      What is the patient wearing?: Pants, Underwear/pull up     Lower body assist Assist for  lower body dressing: Maximal Assistance - Patient 25 - 49%     Toileting Toileting Toileting Activity did not occur (Clothing management and hygiene only): N/A (no void or bm)  Toileting assist Assist for toileting: Moderate Assistance - Patient 50 - 74%     Transfers Chair/bed transfer  Transfers assist     Chair/bed transfer assist level: Moderate Assistance - Patient 50 - 74% (stedy)     Locomotion Ambulation   Ambulation assist      Assist level: 2 helpers Assistive device: Other (comment) (eva walker) Max distance: 29'   Walk 10 feet activity   Assist  Walk 10 feet activity did not occur: Safety/medical concerns  Assist level: 2 helpers Assistive device:  (eva walker)    Walk 50 feet activity   Assist Walk 50 feet with 2 turns activity did not occur: Safety/medical concerns  Assist level: 2 helpers Assistive device:  (eva walker)    Walk 150 feet activity   Assist Walk 150 feet activity did not occur: Safety/medical concerns         Walk 10 feet on uneven surface  activity   Assist Walk 10 feet on uneven surfaces activity did not occur: Safety/medical concerns         Wheelchair     Assist Is the patient using a wheelchair?: Yes Type of Wheelchair: Manual (TIS)    Wheelchair assist level: Total Assistance - Patient < 25%      Wheelchair 50 feet with 2 turns activity    Assist        Assist Level: Total Assistance - Patient < 25%   Wheelchair 150 feet activity     Assist      Assist Level: Total Assistance - Patient < 25%   Blood pressure (!) 119/56, pulse 75, temperature (!) 97.5 F (36.4 C), resp. rate 17, height 6' (1.829 m), weight 84.3 kg, SpO2 98%.  1. 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   2.  Antithrombotics: -DVT/anticoagulation:  Pharmaceutical: Heparin             -antiplatelet therapy: Aspirin and Plavix for three weeks followed by aspirin alone>>last dose of Plavix 8/30.    3. Pain Management: chronic back pain -Tylenol as needed             -continue tramadol 50 mg TID   4. Mood/Behavior/Sleep/delirium: LCSW to evaluate and provide emotional support             -continue trazodone 100 mg q HS             -antipsychotic agents: continue Zyprexa 5 mg daily             -Last night, got as needed Haldol for attempts to get out of bed and severe agitation; much calmer and more appropriate today, in general has  been doing better, will place on telemetry with Posey belt  9/3: seems to be sleeping ok   -use tele-sitter instead of posey belt  5. Neuropsych/cognition: This patient is not quite capable of making decisions on his own behalf.  6. Skin/Wound Care: Routine skin care checks   7. Fluids/Electrolytes/Nutrition: Routine Is and Os and follow-up chemistries             -dys 3 with thin liquids             -SLP eval   8: Hypertension: monitor TID and prn (home Cozaar 25 mg daily held)             -continue metoprolol 50 mg BID   -9/5 well controlled, monitor    07/19/2023    1:10 PM 07/19/2023    3:25 AM 07/18/2023    7:28 PM  Vitals with BMI  Systolic 119 116 782  Diastolic 56 71 68  Pulse 75 80 94    9: Hyperlipidemia: statin held (follow-up LFTs and restart atorvastatin 40 mg as appropriate)   10: Hypothyroidism: continue Synthroid   11: Acute cholecystitis s/p cholecystostomy tube             -routine drain care. Site is fine             -continue Levaquin 250 mg daily   12: DM-2: CBGs QID; A1c = 8.8%; on insulin at home             -continue SSI  -9/5 controlled, continue current  CBG (last 3)  Recent Labs    07/18/23 2040 07/19/23 0602 07/19/23 1140  GLUCAP 180* 168* 171*      13: Hx of CAD s/p CABG 2016: on Plavix, asa, BB; ? resume statin   14: CKD IIIa: baseline Cr ~1.3             -follow-up BMP  9/3--Cr 1.54, near baseline---no changes  -encourage appropriate fluids  9/4 BUN and CR 18/1.57 around baseline, continue to encourage fluids  15: GI prophylaxis: continue Protonix   16.  Bowel and bladder incontinence.    PVRs.  -last Bms noted to be smears on 9/2 and 9/1, schedule miralax for constipation  17. R knee pain  -Start voltaren gel, consider xray if not improved by tomorrow  18. Malnutrition, severe  -9/5 RD following, appreciate assistance, continue to encourage PO intake   19. Constipation  -9/5 continue miralax increase to BID, Check ABD  xray  LOS: 5 days A FACE TO FACE EVALUATION WAS PERFORMED  Fanny Dance 07/19/2023, 1:44 PM

## 2023-07-19 NOTE — Progress Notes (Signed)
Occupational Therapy Session Note  Patient Details  Name: Hunter Sparks MRN: [id number] Date of Birth: May 21, 1946  Today's Date: 07/19/2023 OT Individual Time: 1300-1400 OT Individual Time Calculation (min): 60 min    Short Term Goals: Week 1:  OT Short Term Goal 1 (Week 1): Patient will don pull over or front opening shirt with min assist OT Short Term Goal 2 (Week 1): Patient will identify need to void, and trasnfer to commode with min assist OT Short Term Goal 3 (Week 1): Patient will don lower body clothing with mod assist OT Short Term Goal 4 (Week 1): Patient will stand and free one hand at a time to help manage pants up or down during toileting OT Short Term Goal 5 (Week 1): Patient will transfer sit to stand with min assist in preparation for transfer  Skilled Therapeutic Interventions/Progress Updates:    Pt greeted semi-reclined in TIS wc. Agreeable to therapy although very lethargic. Pt brought to therapy gym and tried to engage pt with Kinetron for LB strengthening for 2, 1 minute intervals. Pt needed max cues to maintain attention due to lethargy. Pt brought to high-low table and addressed sit<>stands with max A progressing to more mod A. Pt tolerated standing for 1 minute intervals. Engaged pt in Connect 4 activity while standing. Pt initiated power up to stand with min/mod A, but as he fatigued in standing, started leaning to the R. Pt tolerated standing 3, 1-2  minute intervals while playing game. Min to mod A for standing balance. Pt returned to room and agreeable to stay up in wc for his last therapy session with SLP. Pt lett with alarm on, call bell in reach, wife present, and needs met.   Therapy Documentation Precautions:  Precautions Precautions: Fall Precaution Comments: left hemiparesis, pusher Restrictions Weight Bearing Restrictions: No Other Position/Activity Restrictions: Rt sided percutaneous drain Pain: Pain Assessment Pain Scale: 0-10 Pain Score: 9   Pain Type: Acute pain Pain Location: Back Pain Descriptors / Indicators: Aching Pain Frequency: Constant Pain Onset: On-going Pain Intervention(s): Repositioned    Therapy/Group: Individual Therapy  [name] 07/19/2023, 1:30 PM

## 2023-07-20 DIAGNOSIS — N1831 Chronic kidney disease, stage 3a: Secondary | ICD-10-CM | POA: Diagnosis not present

## 2023-07-20 DIAGNOSIS — I639 Cerebral infarction, unspecified: Secondary | ICD-10-CM | POA: Diagnosis not present

## 2023-07-20 DIAGNOSIS — E1122 Type 2 diabetes mellitus with diabetic chronic kidney disease: Secondary | ICD-10-CM | POA: Diagnosis not present

## 2023-07-20 DIAGNOSIS — I10 Essential (primary) hypertension: Secondary | ICD-10-CM | POA: Diagnosis not present

## 2023-07-20 DIAGNOSIS — M25562 Pain in left knee: Secondary | ICD-10-CM

## 2023-07-20 LAB — GLUCOSE, CAPILLARY
Glucose-Capillary: 176 mg/dL — ABNORMAL HIGH (ref 70–99)
Glucose-Capillary: 209 mg/dL — ABNORMAL HIGH (ref 70–99)
Glucose-Capillary: 153 mg/dL — ABNORMAL HIGH (ref 70–99)
Glucose-Capillary: 145 mg/dL — ABNORMAL HIGH (ref 70–99)

## 2023-07-20 MED ORDER — SORBITOL 70 % SOLN
30.0000 mL | Freq: Once | Status: AC
  Administered 2023-07-20: 30 mL via ORAL
  Filled 2023-07-20: qty 30

## 2023-07-20 MED ORDER — GABAPENTIN 100 MG PO CAPS
100.0000 mg | ORAL_CAPSULE | Freq: Three times a day (TID) | ORAL | Status: DC
  Administered 2023-07-20 – 2023-07-22 (×7): 100 mg via ORAL
  Filled 2023-07-20 (×10): qty 1

## 2023-07-20 NOTE — Progress Notes (Signed)
Physical Therapy Session Note  Patient Details  Name: Hunter Sparks MRN: [id number] Date of Birth: Nov 15, 1945  Today's Date: 07/20/2023 PT Individual Time: 1305-1400 PT Individual Time Calculation (min): 55 min   Short Term Goals: Week 1:  PT Short Term Goal 1 (Week 1): pt will maintain dynamic seated balance with mod A PT Short Term Goal 2 (Week 1): Pt will perform bed to chair transfer with LRAD and mod A PT Short Term Goal 3 (Week 1): pt will perform sit to stand with LRAD and mod A PT Short Term Goal 4 (Week 1): pt will ambulated 20 feet with LRAD and max A  Skilled Therapeutic Interventions/Progress Updates:    Pt presents in room seated in TIS WC, poorly positioned, asleep but awakens with verbal cueing however very lethargic and falling back to sleep throughout session. Pt sleepily agrees to PT session. Pt reports pain in backside from positioning. Session focused on therapeutic activities to increase alertness and participation with therapies. Pt requires increased time to complete all activities due to lethargy, requires frequent extended rest breaks.  Pt transported dependently from room to main gym for time management. Pt positioned with eva walker in front, completes stand with mod assist x2 to eva walker. Pt demonstrates significantly flexed BLEs, poor positioning of BUEs and trunk flexed. Pt takes 3 steps forward, poor sequencing and foot clearance shuffling gait. Pt transitioned to sitting in WC with max assist. Pt provided with cold washrag to increase alertness with minimal success, continues to open eyes to stimulus and able to respond to questions however quickly falls asleep.  Pt transported to day room dependently and positioned in front of standing frame. Pt completes stand in standing frame 2x1.5 min with consistent max verbal cues and assist for upright posture with pt continuing to demonstrate forward trunk flexion, demonstrates L lateral lean in standing able to correct  initially however continues to demonstrate lean with fatigue.  Pt returned to room and completes sit<>stand to sara stand with mod assist demonstrating great midline orientation and upright posture while standing with sara stedy. Pt brief noted to be soiled requires 2nd person assist for managing brief and pants with fatigue with pt demonstrating L lateropulsion and knee/trunk flexion. Pt requires assist x2 to stand while positioned in sara stedy due to pt falling asleep and sits with max assist x2. Pt completes bed mobility totalAx2 sit to supine for BLE/trunk mgmt and pt remiains with all needs within reach, call light in place, and bed alarm activated at end of session.  Therapy Documentation Precautions:  Precautions Precautions: Fall Precaution Comments: left hemiparesis, pusher Restrictions Weight Bearing Restrictions: No Other Position/Activity Restrictions: Rt sided percutaneous drain   Therapy/Group: Individual Therapy  Edwin Cap PT, DPT 07/20/2023, 4:39 PM

## 2023-07-20 NOTE — Progress Notes (Signed)
Occupational Therapy Session Note  Patient Details  Name: Hunter Sparks MRN: [id number] Date of Birth: 1946-02-21  Today's Date: 07/20/2023 OT Individual Time: 4098-1191 OT Individual Time Calculation (min): 35 min    Skilled Therapeutic Interventions/Progress Updates: Patient received resting in bed. Agreeable to OT treatment focused on ADL's. Patient assisted from supine to EOB with min assist. Stand pivot to w/c with mod assist for grooming and bathing tasks sitting at the sink. Patient performed self care with levels left below. Patient agreeable to sitting up in the reclined w/c following self care. Patient will benefit from continued OT POC to work on improved balance, self care and safety.      Therapy Documentation Precautions:  Precautions Precautions: Fall Precaution Comments: left hemiparesis, pusher Restrictions Weight Bearing Restrictions: No Other Position/Activity Restrictions: Rt sided percutaneous drain    Pain:Unable to quantify. Reported belly pain and BLE's with fluctuation.    ADL: ADL Eating: Set-up Grooming: Set up assist for oral care and grooming Where Assessed-Grooming: Sitting at sink Upper Body Bathing: Min assist seated at sink Lower Body Bathing: Max assist seated at sink Upper Body Dressing: Min assist to don t-shirt Lower Body Dressing: Max assist with pull up pant Toileting: Unable to assess Toilet Transfer: Unable to assess Toilet Transfer Method: Stand pivot Tub/Shower Transfer: Unable to assess     Therapy/Group: Individual Therapy  Warnell Forester 07/20/2023, 12:38 PM

## 2023-07-20 NOTE — Progress Notes (Signed)
Speech Language Pathology Daily Session Note  Patient Details  Name: Hunter Sparks MRN: [id number] Date of Birth: 10-18-1946  Today's Date: 07/20/2023 SLP Individual Time: 4098-1191 SLP Individual Time Calculation (min): 56 min  Short Term Goals: Week 1: SLP Short Term Goal 1 (Week 1): Patient will utilize external aids to orient to time with Min verbal cues. SLP Short Term Goal 2 (Week 1): Patient will demonstrate functional problem solving for basic and familiar tasks with Min verbal cues. SLP Short Term Goal 3 (Week 1): Patient will demonstrate selective attention in a mildly distracting enviornment to a functional task for 15 minutes with Min verbal cues for redirection. SLP Short Term Goal 4 (Week 1): Patient will utilize memory compensatory strategies to recall information during structured tasks with 75% accuracy and Min verbal and visual cues.  Skilled Therapeutic Interventions: SLP conducted skilled therapy session targeting cognitive retraining. Greeted patient in bed, reporting 8/10 pain in back and neck. SLP offered to alert RN for potential administration of pain medications if due and/or reposition patient, though patient indicated he did not want to take any medications because he "wanted to be ready for tomorrow." Patient unable to specify what is happening tomorrow upon prompting. SLP and patient discussed therapy schedule with patient requiring mod-maxA to interpret items including therapist names, times, and types. SLP and patient then discussed orientation to date with patient requiring maxA to utilize room aids. With room aid, patient answered basic calendar use/problem solving questions given maxA. During temporal verbal basic problem solving tasks, patient required frequent repetition of prompt and modA to solve. Finally, SLP trialed continued ability to utilize cellphone. Patient recalled phone password independently and located how to call wife with supervision A. SLP and  patient reviewed reason for admission and therapy goals across all domains. Patient required maxA to identify deficits and continues to demonstrate reduced intellectual and anticipatory awareness, stating that he would be doing a lot better if he were just at home because he "knows what (he) can grab on to." Patient was left in lowered bed with call bell in reach and bed alarm set. SLP will continue to target goals per plan of care.      Pain Pain Assessment Pain Scale: 0-10 Pain Score: 8  Pain Location: Back Pain Intervention(s):  (SLP offered to alert RN and/or reposition, though patient declined.)  Therapy/Group: Individual Therapy  Jeannie Done, M.A., CCC-SLP  [name] 07/20/2023, 3:47 PM

## 2023-07-20 NOTE — Progress Notes (Signed)
Speech Language Pathology Daily Session Note  Patient Details  Name: Hunter Sparks MRN: [id number] Date of Birth: 02/01/1946  Today's Date: 07/20/2023 SLP Individual Time: 0730-0810 SLP Individual Time Calculation (min): 40 min  Short Term Goals: Week 1: SLP Short Term Goal 1 (Week 1): Patient will utilize external aids to orient to time with Min verbal cues. SLP Short Term Goal 2 (Week 1): Patient will demonstrate functional problem solving for basic and familiar tasks with Min verbal cues. SLP Short Term Goal 3 (Week 1): Patient will demonstrate selective attention in a mildly distracting enviornment to a functional task for 15 minutes with Min verbal cues for redirection. SLP Short Term Goal 4 (Week 1): Patient will utilize memory compensatory strategies to recall information during structured tasks with 75% accuracy and Min verbal and visual cues.  Skilled Therapeutic Interventions: Skilled treatment session focused on cognitive goals. Upon arrival, patient was positioned poorly with his breakfast tray in front of him. Patient with minimal to no PO intake. SLP provided encouragement and Max A multimodal cues, however, patient continued to decline breakfast meal due to pain. Nursing made aware and patient repositioned in bed. Patient remained lethargic throughout session and required Max verbal and visual cues for utilization of external aid for recall of date. Mod-Max verbal and visual cues were also needed for utilization of schedule to anticipate upcoming therapy schedule. Patient continues to report poor PO intake is due to pain and overall dislike of the food. SLP provided patient with a menu to allow for more options and patient preference for lunch meal. Max A multimodal cues were needed to navigate menu efficiently with patient reporting dislike of all options. Patient did request a "banana sandwich." SLP provided education that the kitchen would not make the sandwich but did have all of  the components needed. SLP requested the individual items from the kitchen via service response and informed NT who reported she will make the sandwich for him at lunch if family is not present in hopes of maximizing PO intake. Patient left semi-reclined in bed with alarm on and all needs within reach. Continue with current plan of care.   Pain 8/10 pain in left leg Patient premedicated, Nursing made aware Patient repositioned   Therapy/Group: Individual Therapy  Jeanelle Dake 07/20/2023, 8:17 AM

## 2023-07-20 NOTE — Progress Notes (Signed)
PROGRESS NOTE   Subjective/Complaints:  Patient working with therapy this morning.  Reports burning around his abdomen has improved however he now has pain in his shoulders and legs.  Reports he has had pain for many years in these areas.  ROS: Patient denies fevers, chills, chest pain, shortness of breath, nausea, vomiting  + Right knee pain+ improved + GI discomfort -improved  Objective:   DG Abd 2 Views  Result Date: 07/19/2023 CLINICAL DATA:  Constipation EXAM: ABDOMEN - 2 VIEW COMPARISON:  07/09/2023 FINDINGS: Cholecystostomy tube projects over the right upper quadrant, unchanged. A safety pin as well as other loop linear radiopaque densities project over the right abdomen/colon. Exact position of these is unclear. These could be external to the patient, recommend clinical correlation. Calcification projects over the left kidney measuring 5 mm. Nonobstructive bowel gas pattern. Moderate stool burden throughout the colon. No organomegaly or free air. IMPRESSION: Safety pin and radiopaque linear densities project over the right abdomen. Exact location is difficult to determine. These could be external to the patient, recommend clinical correlation. Cannot completely exclude ingested foreign bodies within the right colon. Moderate stool burden in the colon. Electronically Signed   By: Charlett Nose M.D.   On: 07/19/2023 19:37   No results for input(s): "WBC", "HGB", "HCT", "PLT" in the last 72 hours.  Recent Labs    07/19/23 0627  NA 134*  K 4.3  CL 100  CO2 25  GLUCOSE 164*  BUN 18  CREATININE 1.57*  CALCIUM 8.7*    Intake/Output Summary (Last 24 hours) at 07/20/2023 1251 Last data filed at 07/20/2023 0750 Gross per 24 hour  Intake 583 ml  Output 460 ml  Net 123 ml        Physical Exam: Vital Signs Blood pressure (!) 140/72, pulse 78, temperature (!) 97.5 F (36.4 C), resp. rate 18, height 6' (1.829 m), weight 84.3 kg,  SpO2 96%.  Constitutional: No distress . Vital signs reviewed. HEENT: NCAT, EOMI, oral membranes moist Neck: supple Cardiovascular: RRR without murmur. No JVD    Respiratory/Chest: CTA Bilaterally without wheezes or rales. Normal effort    GI/Abdomen: BS +, non-distended, drain in place RUQ-abdomen nontender today Ext: no clubbing, cyanosis, or edema Psych: Cooperative, flat Skin: C/D/I. No apparent lesions.  Drain site intact   MSK:      No apparent deformity.  Neurologic exam:  Cognition: AAO to person, place, time and event. Poor insight. Can perseverate. Can be distracted by environment Sensation: To light touch intact in BL UEs and LEs  CN: 2-12 grossly intact.  Coordination:  Bilateral upper extremity ataxia, left greater than right.  Strength: 5 out of 5 strength throughout bilateral upper extremities 5 out of 5 strength in right lower extremity 4+ out of 5 strength throughout left lower extremity   Assessment/Plan: 1. Functional deficits which require 3+ hours per day of interdisciplinary therapy in a comprehensive inpatient rehab setting. Physiatrist is providing close team supervision and 24 hour management of active medical problems listed below. Physiatrist and rehab team continue to assess barriers to discharge/monitor patient progress toward functional and medical goals  Care Tool:  Bathing  Bathing activity did not  occur: Refused Body parts bathed by patient: Face   Body parts bathed by helper: Right arm, Left arm, Chest, Abdomen, Front perineal area, Buttocks, Right upper leg, Left upper leg, Left lower leg     Bathing assist Assist Level: Total Assistance - Patient < 25%     Upper Body Dressing/Undressing Upper body dressing   What is the patient wearing?: Button up shirt    Upper body assist Assist Level: Moderate Assistance - Patient 50 - 74%    Lower Body Dressing/Undressing Lower body dressing      What is the patient wearing?: Pants,  Underwear/pull up     Lower body assist Assist for lower body dressing: Maximal Assistance - Patient 25 - 49%     Toileting Toileting Toileting Activity did not occur (Clothing management and hygiene only): N/A (no void or bm)  Toileting assist Assist for toileting: Moderate Assistance - Patient 50 - 74%     Transfers Chair/bed transfer  Transfers assist     Chair/bed transfer assist level: Moderate Assistance - Patient 50 - 74% (stedy)     Locomotion Ambulation   Ambulation assist      Assist level: 2 helpers Assistive device: Other (comment) (eva walker) Max distance: 73'   Walk 10 feet activity   Assist  Walk 10 feet activity did not occur: Safety/medical concerns  Assist level: 2 helpers Assistive device:  (eva walker)   Walk 50 feet activity   Assist Walk 50 feet with 2 turns activity did not occur: Safety/medical concerns  Assist level: 2 helpers Assistive device:  (eva walker)    Walk 150 feet activity   Assist Walk 150 feet activity did not occur: Safety/medical concerns         Walk 10 feet on uneven surface  activity   Assist Walk 10 feet on uneven surfaces activity did not occur: Safety/medical concerns         Wheelchair     Assist Is the patient using a wheelchair?: Yes Type of Wheelchair: Manual (TIS)    Wheelchair assist level: Total Assistance - Patient < 25%      Wheelchair 50 feet with 2 turns activity    Assist        Assist Level: Total Assistance - Patient < 25%   Wheelchair 150 feet activity     Assist      Assist Level: Total Assistance - Patient < 25%   Blood pressure (!) 140/72, pulse 78, temperature (!) 97.5 F (36.4 C), resp. rate 18, height 6' (1.829 m), weight 84.3 kg, SpO2 96%.  1. 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   2.  Antithrombotics: -DVT/anticoagulation:  Pharmaceutical: Heparin             -antiplatelet therapy: Aspirin and Plavix for three weeks followed by aspirin alone>>last dose of Plavix 8/30.    3. Pain Management: chronic back pain -Tylenol as needed             -continue tramadol 50 mg TID  -Patient reporting chronic pain in multiple areas of his body, will try  low-dose gabapentin 100 mg 3 times daily   4. Mood/Behavior/Sleep/delirium: LCSW to evaluate and provide emotional support             -continue trazodone 100 mg q HS             -antipsychotic agents: continue Zyprexa 5 mg daily             -Last night, got as needed Haldol for attempts to get out of bed and severe agitation; much calmer and more appropriate today, in general has been doing better, will place on telemetry with Posey belt  9/3: seems to be sleeping ok   -use tele-sitter instead of posey belt  5. Neuropsych/cognition: This patient is not quite capable of making decisions on his own behalf.   6. Skin/Wound Care: Routine skin care checks   7. Fluids/Electrolytes/Nutrition: Routine Is and Os and follow-up chemistries             -dys 3 with thin liquids             -SLP eval   8: Hypertension: monitor TID and prn (home Cozaar 25 mg daily held)             -continue metoprolol 50 mg BID   -9/6 controlled continue current regimen    07/20/2023    4:51 AM 07/19/2023    8:21 PM 07/19/2023    1:10 PM  Vitals with BMI  Systolic 140 105 130  Diastolic 72 63 56  Pulse 78 85 75    9: Hyperlipidemia: statin held (follow-up LFTs and restart atorvastatin 40 mg as appropriate)   10: Hypothyroidism: continue Synthroid   11: Acute cholecystitis s/p cholecystostomy tube             -routine drain care. Site is fine             -continue Levaquin 250 mg daily   12: DM-2: CBGs QID; A1c = 8.8%;  on insulin at home             -continue SSI  -9/6 CBGs little higher today, continue to monitor trend for now  CBG (last 3)  Recent Labs    07/19/23 2022 07/20/23 0619 07/20/23 1216  GLUCAP 172* 176* 209*      13: Hx of CAD s/p CABG 2016: on Plavix, asa, BB; ? resume statin   14: CKD IIIa: baseline Cr ~1.3             -follow-up BMP  9/3--Cr 1.54, near baseline---no changes  -encourage appropriate fluids  9/4 BUN and CR 18/1.57 around baseline, continue to encourage fluids  15: GI prophylaxis: continue Protonix   16.  Bowel and bladder incontinence.    PVRs.  -last Bms noted to be smears on 9/2 and 9/1, schedule miralax for constipation  17. R knee pain  -Start voltaren gel, consider xray if not improved by tomorrow  18. Malnutrition, severe  -9/5 RD following, appreciate assistance, continue to encourage PO intake   19. Constipation  -9/5 continue miralax increase to BID, Check ABD xray  -9/6 moderate stool burden, will order sorbitol x1  LOS: 6 days A FACE TO FACE EVALUATION WAS PERFORMED  Fanny Dance 07/20/2023, 12:51 PM

## 2023-07-21 DIAGNOSIS — K5901 Slow transit constipation: Secondary | ICD-10-CM | POA: Diagnosis not present

## 2023-07-21 DIAGNOSIS — I639 Cerebral infarction, unspecified: Secondary | ICD-10-CM | POA: Diagnosis not present

## 2023-07-21 LAB — GLUCOSE, CAPILLARY
Glucose-Capillary: 161 mg/dL — ABNORMAL HIGH (ref 70–99)
Glucose-Capillary: 178 mg/dL — ABNORMAL HIGH (ref 70–99)
Glucose-Capillary: 352 mg/dL — ABNORMAL HIGH (ref 70–99)
Glucose-Capillary: 131 mg/dL — ABNORMAL HIGH (ref 70–99)

## 2023-07-21 MED ORDER — SENNOSIDES-DOCUSATE SODIUM 8.6-50 MG PO TABS
2.0000 | ORAL_TABLET | Freq: Every day | ORAL | Status: DC
  Administered 2023-07-21 – 2023-08-04 (×14): 2 via ORAL
  Filled 2023-07-21 (×15): qty 2

## 2023-07-21 NOTE — Progress Notes (Signed)
Physical Therapy Session Note  Patient Details  Name: Hunter Sparks MRN: [id number] Date of Birth: [birth year]  Today's Date: 07/21/2023 PT Individual Time: 0902-1012 PT Individual Time Calculation (min): 70 min   Short Term Goals: Week 1:  PT Short Term Goal 1 (Week 1): pt will maintain dynamic seated balance with mod A PT Short Term Goal 2 (Week 1): Pt will perform bed to chair transfer with LRAD and mod A PT Short Term Goal 3 (Week 1): pt will perform sit to stand with LRAD and mod A PT Short Term Goal 4 (Week 1): pt will ambulated 20 feet with LRAD and max A  Skilled Therapeutic Interventions/Progress Updates:    Pt received supported upright in bed awake and eager to participate in therapy session stating "I'm ready to run." Donned socks and shoes total A for time management - pt also unable to reach feet in bed. Supine>sitting R EOB, HOB elevated and using bedrail, with increased time/effort and CGA for safety. Sitting EOB, donned jacket total A and pt attempted to zip it but unable and noticed to have minor posterior trunk LOB during this dual-task challenge.   L squat pivot EOB>TIS w/c with mod A for lifting/pivoting hips - pt initially resistant to hands-on assist stating therapist was too close to him, but with redirection is agreeable.   PA in/out for morning assessment.   Transported to/from gym in w/c for time management and energy conservation.   Noticed pt with increased drowsiness from sitting upright in TIS w/c.  Assessed vitals: BP 116/69 (MAP 83), HR 73bpm   Sit>stand TIS w/c>Eva walker with heavy mod A of 1 for rising to stand (+2 assist CGA for safety and AD management as needed) - pt slow to rise and improves upright posture with [name] walker raised to appropriate height - pt does best when pushing up with both hands from w/c armrests but often will reach to pull up on Fara Boros handles.  Gait training 89ft x3 (seated breaks between each) using Fara Boros with mod  assist of 1 for balance and AD management with +2 close CGA for safety progressed to +2 min A at end to safely turn and sit on mat - during 2nd walk transitioned to therapist guarding from behind to facilitate weight shift and +2 in front to manage AD and provide visual targets with significant improvement in gait mechanics and AD management (recommend continuing with this set-up). Pt demonstrating the following gait deviations with therapist providing the described cuing and facilitation for improvement:  - decreased L LE foot clearance, with primarily step-to pattern but with cuing able to achieve reciprocal stepping inconsistently - narrow BOS - slight crouched posture that worsens with fatigue  - incoordinated L LE movement during swing - during 2nd walk towards end, noticed more prominent L lateral lean with fatigue  - during 2nd walk provided cue of kicking L leg towards visual target of L front bar of Eva walker and pt able to achieve reciprocal stepping pattern >90% of the time - during 3rd walk pt much more fatigued with worsening crouch and poorer LLE foot clearance performing step-to pattern >75% of the time - requires +2 for balance and AD management to safely turn and sit on mat vs in w/c at end of each gait trial  Pt requires prolonged seated rest breaks between each gait trial and when sitting EOM, without back support, noticed pt with L postero-lateral trunk lean and gripping hands on edge of mat  to hold himself upright.  Vitals in sitting after 2nd walk: BP 131/69 (MAP 87), HR 85bpm   L squat pivot EOM>TIS w/c at end of session with mod A and pt having to take smaller scoots to complete transfer due to fatigue.  Transported back to room and performed R lateral scoot/squat pivot TIS W/C>EOB with min assist and several small scoots to complete transfer.  Sit>supine with CGA for safety and cuing with increased time to bring B LEs into the bed. At end of session, pt left supine in bed  with needs in reach, lines intact, and bed alarm on.     Therapy Documentation Precautions:  Precautions Precautions: Fall Precaution Comments: left hemiparesis, pusher Restrictions Weight Bearing Restrictions: No Other Position/Activity Restrictions: Rt sided percutaneous drain   Pain:  Reports L posterior hip pain stating "a little bit, ain't much" that started after gait training - pt denies need of medications and reports rest will alleviate his symptoms.    Therapy/Group: Individual Therapy  Ginny Forth , PT, DPT, NCS, CSRS 07/21/2023, 7:47 AM

## 2023-07-21 NOTE — Progress Notes (Signed)
PROGRESS NOTE   Subjective/Complaints:  Pt doing alright, slept alright, pain well controlled, unsure of LBM but appears it's been since 07/19/23. Urinating alright. Denies any other complaints or concerns today.   ROS: Patient denies fevers, chills, chest pain, shortness of breath, abd pain, nausea, vomiting  + Right knee pain+ improved + GI discomfort -improved  Objective:   DG Abd 2 Views  Result Date: 07/19/2023 CLINICAL DATA:  Constipation EXAM: ABDOMEN - 2 VIEW COMPARISON:  07/09/2023 FINDINGS: Cholecystostomy tube projects over the right upper quadrant, unchanged. A safety pin as well as other loop linear radiopaque densities project over the right abdomen/colon. Exact position of these is unclear. These could be external to the patient, recommend clinical correlation. Calcification projects over the left kidney measuring 5 mm. Nonobstructive bowel gas pattern. Moderate stool burden throughout the colon. No organomegaly or free air. IMPRESSION: Safety pin and radiopaque linear densities project over the right abdomen. Exact location is difficult to determine. These could be external to the patient, recommend clinical correlation. Cannot completely exclude ingested foreign bodies within the right colon. Moderate stool burden in the colon. Electronically Signed   By: Charlett Nose M.D.   On: 07/19/2023 19:37   No results for input(s): "WBC", "HGB", "HCT", "PLT" in the last 72 hours.  Recent Labs    07/19/23 0627  NA 134*  K 4.3  CL 100  CO2 25  GLUCOSE 164*  BUN 18  CREATININE 1.57*  CALCIUM 8.7*    Intake/Output Summary (Last 24 hours) at 07/21/2023 1211 Last data filed at 07/21/2023 1200 Gross per 24 hour  Intake 484 ml  Output 975 ml  Net -491 ml        Physical Exam: Vital Signs Blood pressure 122/71, pulse 89, temperature 97.6 F (36.4 C), temperature source Oral, resp. rate 18, height 6' (1.829 m), weight 84.3  kg, SpO2 95%.  Constitutional: No distress . Vital signs reviewed. Up in w/c with therapy HEENT: NCAT, EOMI, oral membranes moist Neck: supple Cardiovascular: RRR without murmur. No JVD    Respiratory/Chest: CTA Bilaterally without wheezes or rales. Normal effort    GI/Abdomen: BS +, non-distended, drain in place RUQ is c/d/i-abdomen nontender Ext: no clubbing, cyanosis, or edema Psych: Cooperative, flat Skin: C/D/I. No apparent lesions. Drain site intact MSK:      No apparent deformity.  PRIOR EXAMS: Neurologic exam:  Cognition: AAO to person, place, time and event. Poor insight. Can perseverate. Can be distracted by environment Sensation: To light touch intact in BL UEs and LEs  CN: 2-12 grossly intact.  Coordination:  Bilateral upper extremity ataxia, left greater than right.  Strength: 5 out of 5 strength throughout bilateral upper extremities 5 out of 5 strength in right lower extremity 4+ out of 5 strength throughout left lower extremity   Assessment/Plan: 1. Functional deficits which require 3+ hours per day of interdisciplinary therapy in a comprehensive inpatient rehab setting. Physiatrist is providing close team supervision and 24 hour management of active medical problems listed below. Physiatrist and rehab team continue to assess barriers to discharge/monitor patient progress toward functional and medical goals  Care Tool:  Bathing  Bathing activity did not occur:  Refused Body parts bathed by patient: Face   Body parts bathed by helper: Right arm, Left arm, Chest, Abdomen, Front perineal area, Buttocks, Right upper leg, Left upper leg, Left lower leg     Bathing assist Assist Level: Total Assistance - Patient < 25%     Upper Body Dressing/Undressing Upper body dressing   What is the patient wearing?: Button up shirt    Upper body assist Assist Level: Moderate Assistance - Patient 50 - 74%    Lower Body Dressing/Undressing Lower body dressing      What  is the patient wearing?: Pants, Underwear/pull up     Lower body assist Assist for lower body dressing: Maximal Assistance - Patient 25 - 49%     Toileting Toileting Toileting Activity did not occur (Clothing management and hygiene only): N/A (no void or bm)  Toileting assist Assist for toileting: Moderate Assistance - Patient 50 - 74%     Transfers Chair/bed transfer  Transfers assist     Chair/bed transfer assist level: Moderate Assistance - Patient 50 - 74% (stedy)     Locomotion Ambulation   Ambulation assist      Assist level: 2 helpers Assistive device: Other (comment) (eva walker) Max distance: 75'   Walk 10 feet activity   Assist  Walk 10 feet activity did not occur: Safety/medical concerns  Assist level: 2 helpers Assistive device:  (eva walker)   Walk 50 feet activity   Assist Walk 50 feet with 2 turns activity did not occur: Safety/medical concerns  Assist level: 2 helpers Assistive device:  (eva walker)    Walk 150 feet activity   Assist Walk 150 feet activity did not occur: Safety/medical concerns         Walk 10 feet on uneven surface  activity   Assist Walk 10 feet on uneven surfaces activity did not occur: Safety/medical concerns         Wheelchair     Assist Is the patient using a wheelchair?: Yes Type of Wheelchair: Manual (TIS)    Wheelchair assist level: Total Assistance - Patient < 25%      Wheelchair 50 feet with 2 turns activity    Assist        Assist Level: Total Assistance - Patient < 25%   Wheelchair 150 feet activity     Assist      Assist Level: Total Assistance - Patient < 25%   Blood pressure 122/71, pulse 89, temperature 97.6 F (36.4 C), temperature source Oral, resp. rate 18, height 6' (1.829 m), weight 84.3 kg, SpO2 95%.  1. 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   2.  Antithrombotics: -DVT/anticoagulation:  Pharmaceutical: Heparin 5000U q8h -antiplatelet therapy: Aspirin and Plavix for three weeks followed by aspirin alone>>last dose of Plavix 8/30.    3. Pain Management: chronic back pain -Tylenol and Robaxin as needed, Voltaren QID, lidoderm daily             -continue tramadol 50 mg TID (ordered as q6h PRN currently) -Patient reporting chronic pain in multiple areas of his body, will try low-dose gabapentin 100 mg 3 times daily   4. Mood/Behavior/Sleep/delirium: LCSW  to evaluate and provide emotional support             -continue trazodone 50mg  q HS             -antipsychotic agents: continue Zyprexa 5 mg daily -Last night, got as needed Haldol for attempts to get out of bed and severe agitation; much calmer and more appropriate today, in general has been doing better, will place on telemetry with Posey belt  9/3: seems to be sleeping ok   -use tele-sitter instead of posey belt  5. Neuropsych/cognition: This patient is not quite capable of making decisions on his own behalf.   6. Skin/Wound Care: Routine skin care checks   7. Fluids/Electrolytes/Nutrition: Routine Is and Os and follow-up chemistries             -dys 3 with thin liquids             -SLP eval, cont vitamins   8: Hypertension: monitor TID and prn (home Cozaar 25 mg daily held)             -continue metoprolol 50 mg BID   -07/21/23 controlled continue current regimen Vitals:   07/17/23 1446 07/17/23 2003 07/18/23 0403 07/18/23 1335  BP: 128/70 (!) 146/64 (!) 156/67 133/78   07/18/23 1928 07/19/23 0325 07/19/23 1310 07/19/23 2021  BP: 128/68 116/71 (!) 119/56 105/63   07/20/23 0451 07/20/23 1309 07/20/23 1926 07/21/23 0432  BP: (!) 140/72 (!) 115/57 137/69 122/71     9: Hyperlipidemia: statin held (follow-up LFTs  and restart atorvastatin 40 mg as appropriate)   10: Hypothyroidism: continue Synthroid daily   11: Acute cholecystitis s/p cholecystostomy tube             -routine drain care. Site is fine             -continue Levaquin 250 mg daily   12: DM-2: CBGs QID; A1c = 8.8%; on insulin at home             -continue SSI  -9/6 CBGs little higher today, continue to monitor trend for now  -07/21/23 CBGs generally stable, monitor  CBG (last 3)  Recent Labs    07/20/23 2049 07/21/23 0602 07/21/23 1146  GLUCAP 145* 161* 178*      13: Hx of CAD s/p CABG 2016: on Plavix, asa, BB; ? resume statin   14: CKD IIIa: baseline Cr ~1.3             -follow-up BMP  9/3--Cr 1.54, near baseline---no changes  -encourage appropriate fluids  9/4 BUN and CR 18/1.57 around baseline, continue to encourage fluids  15: GI prophylaxis: continue Protonix 40mg  daily   16.  Bowel and bladder incontinence.    PVRs. -last Bms noted to be smears on 9/2 and 9/1, schedule miralax for constipation (see below) -07/21/23 PVRs not well documented, but seems to have intermittent incontinence; monitor  17. R knee pain  -Start voltaren gel, consider xray if not improved by tomorrow  18. Malnutrition, severe  -9/5 RD following, appreciate assistance, continue to encourage PO intake   19. Constipation  -9/5 continue miralax increase to BID, Check ABD xray  -9/6 moderate stool burden, will order sorbitol x1 -07/21/23 still no BM since 9/5, will add senokot 2 tabs daily, if still not successful may need enema or other adjustments.   LOS: 7 days A FACE TO FACE EVALUATION WAS PERFORMED  [address] 07/21/2023, 12:11 PM

## 2023-07-22 DIAGNOSIS — K5901 Slow transit constipation: Secondary | ICD-10-CM | POA: Diagnosis not present

## 2023-07-22 DIAGNOSIS — R739 Hyperglycemia, unspecified: Secondary | ICD-10-CM | POA: Diagnosis not present

## 2023-07-22 DIAGNOSIS — I639 Cerebral infarction, unspecified: Secondary | ICD-10-CM | POA: Diagnosis not present

## 2023-07-22 LAB — GLUCOSE, CAPILLARY
Glucose-Capillary: 233 mg/dL — ABNORMAL HIGH (ref 70–99)
Glucose-Capillary: 124 mg/dL — ABNORMAL HIGH (ref 70–99)
Glucose-Capillary: 212 mg/dL — ABNORMAL HIGH (ref 70–99)
Glucose-Capillary: 91 mg/dL (ref 70–99)

## 2023-07-22 MED ORDER — INSULIN GLARGINE-YFGN 100 UNIT/ML ~~LOC~~ SOLN
5.0000 [IU] | Freq: Every day | SUBCUTANEOUS | Status: DC
  Administered 2023-07-22 – 2023-07-29 (×8): 5 [IU] via SUBCUTANEOUS
  Filled 2023-07-22 (×10): qty 0.05

## 2023-07-22 NOTE — Progress Notes (Signed)
PROGRESS NOTE   Subjective/Complaints:  Pt doing alright, slept ok, pain well controlled (none currently), unsure of LBM but appears it's been since 07/19/23. Urinating alright. Denies any other complaints or concerns today.   ROS: Patient denies fevers, chills, chest pain, shortness of breath, abd pain, nausea, vomiting  + Right knee pain+ improved + GI discomfort -improved  Objective:   No results found. No results for input(s): "WBC", "HGB", "HCT", "PLT" in the last 72 hours.  No results for input(s): "NA", "K", "CL", "CO2", "GLUCOSE", "BUN", "CREATININE", "CALCIUM" in the last 72 hours.   Intake/Output Summary (Last 24 hours) at 07/22/2023 1050 Last data filed at 07/22/2023 0600 Gross per 24 hour  Intake 10 ml  Output 90 ml  Net -80 ml        Physical Exam: Vital Signs Blood pressure 120/64, pulse 79, temperature 98.1 F (36.7 C), temperature source Oral, resp. rate 16, height 6' (1.829 m), weight 84.3 kg, SpO2 94%.  Constitutional: No distress . Vital signs reviewed. Resting in bed.  HEENT: NCAT, EOMI, oral membranes moist Neck: supple Cardiovascular: RRR without murmur. No JVD    Respiratory/Chest: CTA Bilaterally without wheezes or rales. Normal effort    GI/Abdomen: BS +, non-distended, drain in place RUQ is c/d/i-abdomen nontender Ext: no clubbing, cyanosis, or edema Psych: Cooperative, flat Skin: C/D/I. No apparent lesions. Drain site intact MSK:      No apparent deformity.  PRIOR EXAMS: Neurologic exam:  Cognition: AAO to person, place, time and event. Poor insight. Can perseverate. Can be distracted by environment Sensation: To light touch intact in BL UEs and LEs  CN: 2-12 grossly intact.  Coordination:  Bilateral upper extremity ataxia, left greater than right.  Strength: 5 out of 5 strength throughout bilateral upper extremities 5 out of 5 strength in right lower extremity 4+ out of 5 strength  throughout left lower extremity   Assessment/Plan: 1. Functional deficits which require 3+ hours per day of interdisciplinary therapy in a comprehensive inpatient rehab setting. Physiatrist is providing close team supervision and 24 hour management of active medical problems listed below. Physiatrist and rehab team continue to assess barriers to discharge/monitor patient progress toward functional and medical goals  Care Tool:  Bathing  Bathing activity did not occur: Refused Body parts bathed by patient: Face   Body parts bathed by helper: Right arm, Left arm, Chest, Abdomen, Front perineal area, Buttocks, Right upper leg, Left upper leg, Left lower leg     Bathing assist Assist Level: Total Assistance - Patient < 25%     Upper Body Dressing/Undressing Upper body dressing   What is the patient wearing?: Button up shirt    Upper body assist Assist Level: Moderate Assistance - Patient 50 - 74%    Lower Body Dressing/Undressing Lower body dressing      What is the patient wearing?: Pants, Underwear/pull up     Lower body assist Assist for lower body dressing: Maximal Assistance - Patient 25 - 49%     Toileting Toileting Toileting Activity did not occur (Clothing management and hygiene only): N/A (no void or bm)  Toileting assist Assist for toileting: Moderate Assistance - Patient 50 - 74%  Transfers Chair/bed transfer  Transfers assist     Chair/bed transfer assist level: Moderate Assistance - Patient 50 - 74% (stedy)     Locomotion Ambulation   Ambulation assist      Assist level: 2 helpers Assistive device: Other (comment) (eva walker) Max distance: 77'   Walk 10 feet activity   Assist  Walk 10 feet activity did not occur: Safety/medical concerns  Assist level: 2 helpers Assistive device:  (eva walker)   Walk 50 feet activity   Assist Walk 50 feet with 2 turns activity did not occur: Safety/medical concerns  Assist level: 2  helpers Assistive device:  (eva walker)    Walk 150 feet activity   Assist Walk 150 feet activity did not occur: Safety/medical concerns         Walk 10 feet on uneven surface  activity   Assist Walk 10 feet on uneven surfaces activity did not occur: Safety/medical concerns         Wheelchair     Assist Is the patient using a wheelchair?: Yes Type of Wheelchair: Manual (TIS)    Wheelchair assist level: Total Assistance - Patient < 25%      Wheelchair 50 feet with 2 turns activity    Assist        Assist Level: Total Assistance - Patient < 25%   Wheelchair 150 feet activity     Assist      Assist Level: Total Assistance - Patient < 25%   Blood pressure 120/64, pulse 79, temperature 98.1 F (36.7 C), temperature source Oral, resp. rate 16, height 6' (1.829 m), weight 84.3 kg, SpO2 94%.  1. 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   2.  Antithrombotics: -DVT/anticoagulation:  Pharmaceutical: Heparin 5000U q8h -antiplatelet therapy: Aspirin and Plavix for three weeks followed by aspirin alone>>last dose of Plavix 8/30.    3. Pain Management: chronic back pain -Tylenol and Robaxin as needed, Voltaren QID, lidoderm daily             -continue tramadol 50 mg TID (ordered as q6h PRN currently) -Patient reporting chronic pain in multiple areas of his body, will try low-dose gabapentin 100 mg 3 times daily   4. Mood/Behavior/Sleep/delirium: LCSW to evaluate and provide emotional support             -continue trazodone 50mg  q HS             -antipsychotic agents: continue Zyprexa 5 mg daily -Last night, got as needed Haldol for attempts to get out of bed and severe agitation; much calmer  and more appropriate today, in general has been doing better, will place on telemetry with Posey belt  9/3: seems to be sleeping ok   -use tele-sitter instead of posey belt  5. Neuropsych/cognition: This patient is not quite capable of making decisions on his own behalf.   6. Skin/Wound Care: Routine skin care checks   7. Fluids/Electrolytes/Nutrition: Routine Is and Os and follow-up chemistries             -dys 3 with thin liquids             -SLP eval, cont vitamins  8: Hypertension: monitor TID and prn (home Cozaar 25 mg daily held)             -continue metoprolol 50 mg BID   -9/7-8/24 controlled continue current regimen Vitals:   07/18/23 1335 07/18/23 1928 07/19/23 0325 07/19/23 1310  BP: 133/78 128/68 116/71 (!) 119/56   07/19/23 2021 07/20/23 0451 07/20/23 1309 07/20/23 1926  BP: 105/63 (!) 140/72 (!) 115/57 137/69   07/21/23 0432 07/21/23 1403 07/21/23 1932 07/22/23 0407  BP: 122/71 131/79 116/69 120/64     9: Hyperlipidemia: statin held (follow-up LFTs and restart atorvastatin 40 mg as appropriate)   10: Hypothyroidism: continue Synthroid daily   11: Acute cholecystitis s/p cholecystostomy tube             -routine drain care. Site is fine             -continue Levaquin 250 mg daily   12: DM-2: CBGs QID; A1c = 8.8%; on insulin at home             -continue SSI  -9/6 CBGs little higher today, continue to monitor trend for now  -07/21/23 CBGs generally stable, monitor -07/22/23 CBGs a little worse today, looks like he was on toujeo 54U nightly before admission and is now eating better; will restart semglee at 5U nightly to start-- verified with pharmacy that this would be an ok start.   CBG (last 3)  Recent Labs    07/21/23 1633 07/21/23 2107 07/22/23 0603  GLUCAP 352* 131* 233*      13: Hx of CAD s/p CABG 2016: on Plavix, asa, BB; ? resume statin   14: CKD IIIa: baseline Cr ~1.3             -follow-up BMP  9/3--Cr 1.54, near baseline---no changes   -encourage appropriate fluids  9/4 BUN and CR 18/1.57 around baseline, continue to encourage fluids  15: GI prophylaxis: continue Protonix 40mg  daily   16.  Bowel and bladder incontinence.    PVRs. -last Bms noted to be smears on 9/2 and 9/1, schedule miralax for constipation (see below) -07/21/23 PVRs not well documented, but seems to have intermittent incontinence; monitor -07/22/23 only one PVR documented; still intermittent incontinence; defer to weekday team on this topic.   17. R knee pain  -Start voltaren gel, consider xray if not improved by tomorrow  18. Malnutrition, severe  -9/5 RD following, appreciate assistance, continue to encourage PO intake   19. Constipation  -9/5 continue miralax increase to BID, Check ABD xray  -9/6 moderate stool burden, will order sorbitol x1 -07/21/23 still no BM since 9/5, will add senokot 2 tabs daily, if still not successful may need enema or other adjustments.  -07/22/23 still no BM, will have nursing staff give dulcolax, if not then may consider enema.   LOS: 8 days A FACE TO FACE EVALUATION WAS PERFORMED  [address] 07/22/2023, 10:50 AM

## 2023-07-22 NOTE — Progress Notes (Signed)
Occupational Therapy Session Note  Patient Details  Name: Hunter Sparks MRN: [id number] Date of Birth: 21-Jul-1946  Today's Date: 07/22/2023 OT Individual Time: 1440-1509 OT Individual Time Calculation (min): 29 min    Short Term Goals: Week 1:  OT Short Term Goal 1 (Week 1): Patient will don pull over or front opening shirt with min assist OT Short Term Goal 2 (Week 1): Patient will identify need to void, and trasnfer to commode with min assist OT Short Term Goal 3 (Week 1): Patient will don lower body clothing with mod assist OT Short Term Goal 4 (Week 1): Patient will stand and free one hand at a time to help manage pants up or down during toileting OT Short Term Goal 5 (Week 1): Patient will transfer sit to stand with min assist in preparation for transfer  Skilled Therapeutic Interventions/Progress Updates: Patient received resting in bed. Agreeable to OT treatment. Reporting he's been spending too much time in bed and the pain medication is making him sleepy. Assisted OOB with Mod assist. Patient taken to therapy gym for dynamic standing task standing in parallel bars. Stood x 3 with Min/Mod assist and Moderate cuing for safety and foot positioning. Patient stating he wants to be well enough to go home. He reports he can hire help and has 8-9 people that can also assist as needed. But his wife will not be able to help due to her health/chronic conditions. Patient educated on home safety and fall precautions that have him needing moderate assist currently for his self care. Patient seemed understanding of his need for therapeutic intervention and is motivated to get to a level to discharge with HHOT. Followed dynamic standing with seated grooming tasks at a set up level. Needs encouragement and cues for thoroughness.  Continue with skilled OT POC to work towards a level safe for discharge home.     Therapy Documentation Precautions:  Precautions Precautions: Fall Precaution Comments:  left hemiparesis, pusher Restrictions Weight Bearing Restrictions: No Other Position/Activity Restrictions: Rt sided percutaneous drain    ADL: ADL Eating: Unable to assess Grooming: Set up assistance Where Assessed-Grooming: Sitting at sink Lower Body Dressing: Mod Assist sitting EOB Bed mobility- Mod assist Stand pivot transfer- Mod assist   Therapy/Group: Individual Therapy  Warnell Forester 07/22/2023, 3:10 PM

## 2023-07-23 DIAGNOSIS — N1831 Chronic kidney disease, stage 3a: Secondary | ICD-10-CM | POA: Diagnosis not present

## 2023-07-23 DIAGNOSIS — I10 Essential (primary) hypertension: Secondary | ICD-10-CM | POA: Diagnosis not present

## 2023-07-23 DIAGNOSIS — I639 Cerebral infarction, unspecified: Secondary | ICD-10-CM | POA: Diagnosis not present

## 2023-07-23 DIAGNOSIS — K8001 Calculus of gallbladder with acute cholecystitis with obstruction: Secondary | ICD-10-CM | POA: Diagnosis not present

## 2023-07-23 LAB — GLUCOSE, CAPILLARY
Glucose-Capillary: 139 mg/dL — ABNORMAL HIGH (ref 70–99)
Glucose-Capillary: 164 mg/dL — ABNORMAL HIGH (ref 70–99)
Glucose-Capillary: 161 mg/dL — ABNORMAL HIGH (ref 70–99)
Glucose-Capillary: 203 mg/dL — ABNORMAL HIGH (ref 70–99)
Glucose-Capillary: 124 mg/dL — ABNORMAL HIGH (ref 70–99)

## 2023-07-23 LAB — CBC
HCT: 40.1 % (ref 39.0–52.0)
Hemoglobin: 13.4 g/dL (ref 13.0–17.0)
MCH: 30.5 pg (ref 26.0–34.0)
MCHC: 33.4 g/dL (ref 30.0–36.0)
MCV: 91.3 fL (ref 80.0–100.0)
Platelets: 322 10*3/uL (ref 150–400)
RBC: 4.39 MIL/uL (ref 4.22–5.81)
RDW: 13 % (ref 11.5–15.5)
WBC: 6.6 10*3/uL (ref 4.0–10.5)
nRBC: 0 % (ref 0.0–0.2)

## 2023-07-23 LAB — BASIC METABOLIC PANEL
Anion gap: 8 (ref 5–15)
BUN: 26 mg/dL — ABNORMAL HIGH (ref 8–23)
CO2: 27 mmol/L (ref 22–32)
Calcium: 9.1 mg/dL (ref 8.9–10.3)
Chloride: 103 mmol/L (ref 98–111)
Creatinine, Ser: 1.55 mg/dL — ABNORMAL HIGH (ref 0.61–1.24)
GFR, Estimated: 46 mL/min — ABNORMAL LOW (ref >60)
Glucose, Bld: 204 mg/dL — ABNORMAL HIGH (ref 70–99)
Potassium: 3.9 mmol/L (ref 3.5–5.1)
Sodium: 138 mmol/L (ref 135–145)

## 2023-07-23 MED ORDER — TRAZODONE HCL 50 MG PO TABS
50.0000 mg | ORAL_TABLET | Freq: Every day | ORAL | Status: DC
  Administered 2023-07-26 – 2023-08-03 (×9): 50 mg via ORAL
  Filled 2023-07-23 (×11): qty 1

## 2023-07-23 MED ORDER — OLANZAPINE 2.5 MG PO TABS
2.5000 mg | ORAL_TABLET | Freq: Every day | ORAL | Status: DC
  Administered 2023-07-24 – 2023-08-01 (×9): 2.5 mg via ORAL
  Filled 2023-07-23 (×9): qty 1

## 2023-07-23 MED ORDER — SORBITOL 70 % SOLN
60.0000 mL | Status: AC
  Administered 2023-07-23: 60 mL via ORAL
  Filled 2023-07-23: qty 60

## 2023-07-23 NOTE — Progress Notes (Signed)
Speech Language Pathology Weekly Progress and Session Note  Patient Details  Name: Hunter Sparks MRN: [id number] Date of Birth: January 10, 1946  Beginning of progress report period: July 16, 2023 End of progress report period: July 23, 2023  Today's Date: 07/23/2023 SLP Individual Time: 4098-1191 SLP Individual Time Calculation (min): 38 min  Short Term Goals: Week 1: SLP Short Term Goal 1 (Week 1): Patient will utilize external aids to orient to time with Min verbal cues. SLP Short Term Goal 1 - Progress (Week 1): Not met SLP Short Term Goal 2 (Week 1): Patient will demonstrate functional problem solving for basic and familiar tasks with Min verbal cues. SLP Short Term Goal 2 - Progress (Week 1): Not met SLP Short Term Goal 3 (Week 1): Patient will demonstrate selective attention in a mildly distracting enviornment to a functional task for 15 minutes with Min verbal cues for redirection. SLP Short Term Goal 3 - Progress (Week 1): Met SLP Short Term Goal 4 (Week 1): Patient will utilize memory compensatory strategies to recall information during structured tasks with 75% accuracy and Min verbal and visual cues. SLP Short Term Goal 4 - Progress (Week 1): Not met  New Short Term Goals: Week 2: SLP Short Term Goal 1 (Week 2): Patient will utilize external aids to orient to time with Min verbal cues. SLP Short Term Goal 2 (Week 2): Patient will demonstrate functional problem solving for basic and familiar tasks with Min verbal cues. SLP Short Term Goal 3 (Week 2): Patient will utilize memory compensatory strategies to recall information during structured tasks with 75% accuracy and Min verbal and visual cues. SLP Short Term Goal 4 (Week 2): Patient will demonstrate selective attention in a mildly distracting enviornment to a functional task for 15 minutes with supervision.  Weekly Progress Updates: Patient making slow progress towards therapy goals meeting 1/4 short term goals set this  reporting period. Patient currently attends to therapy tasks  of 15 minutes in duration given minA verbal cues for redirection. Patient does better when not in pain, as pain serves as internal distracter. Patient currently required modA to utilize external aids for orientation to date and requires maxA for utilization of basic compensatory memory strategies. SLP provided handout outlining all therapies received and corresponding deficits/goals, with patient requiring maxA to utilize during memory tasks. Patient currently requires totalA to perform basic functional problem solving skills with cellphone. Patient and family education ongoing. Patient will continue to benefit from skilled therapy services during remainder of CIR stay.    Intensity: Minumum of 1-2 x/day, 30 to 90 minutes Frequency: 1 to 3 out of 7 days Duration/Length of Stay: 3 weeks Treatment/Interventions: Cognitive remediation/compensation;Environmental controls;Internal/external aids;Therapeutic Activities;Patient/family education;Functional tasks;Cueing hierarchy  Daily Session  Skilled Therapeutic Interventions: SLP conducted skilled therapy session targeting cognitive retraining. SLP attempted to begin session at scheduled time with patient, however patient in bathroom with NT completing bowel movement. SLP returned a few minutes later to assist NT with patient care and transfer back to bed after patient indicated he was ready. SLP facilitated basic problem solving and orientation tasks utilizing room aids including therapy goal handout and calendar. Patient oriented to date given modA to use external aid and oriented to broad situation, though required maxA to recall basic daily events (I.e. types of therapy received). SLP and patient discussed upcoming team conference in which discharge day will likely be decided with patient requiring maxA to recall professionals present in the meeting after max repetition and 3 minute distracted  delay. Patient continues to demonstrate reduced awareness of deficits in general but particularly during phone task. During basic familiar problem solving tasks with cell phone (e.g. finding photo app, calling familiar contacts), patient demonstrated need for totalA. Patient was left in lowered bed with call bell in reach and bed alarm set. SLP will continue to target goals per plan of care.       Pain Pain Assessment Pain Scale: 0-10 Pain Score: 0-No pain  Therapy/Group: Individual Therapy  Jeannie Done, M.A., CCC-SLP  [name] 07/23/2023, 4:03 PM

## 2023-07-23 NOTE — Progress Notes (Signed)
PROGRESS NOTE   Subjective/Complaints:  Pt says he was given meds last night which made him sleepy. RN present in room stated that no meds were given at bedtime.   ROS: Limited due to cognitive/behavioral   Objective:   No results found. Recent Labs    07/23/23 0652  WBC 6.6  HGB 13.4  HCT 40.1  PLT 322    Recent Labs    07/23/23 0652  NA 138  K 3.9  CL 103  CO2 27  GLUCOSE 204*  BUN 26*  CREATININE 1.55*  CALCIUM 9.1     Intake/Output Summary (Last 24 hours) at 07/23/2023 1052 Last data filed at 07/23/2023 0815 Gross per 24 hour  Intake 692 ml  Output 395 ml  Net 297 ml        Physical Exam: Vital Signs Blood pressure 107/60, pulse 73, temperature (!) 97.5 F (36.4 C), resp. rate 16, height 6' (1.829 m), weight 84.3 kg, SpO2 98%.  Constitutional: No distress . Vital signs reviewed. HEENT: NCAT, EOMI, oral membranes moist Neck: supple Cardiovascular: RRR without murmur. No JVD    Respiratory/Chest: CTA Bilaterally without wheezes or rales. Normal effort    GI/Abdomen: BS +, non-tender, non-distended Ext: no clubbing, cyanosis, or edema Psych: flat, slow to process Skin: C/D/I. No apparent lesions. Drain site intact MSK:      No apparent deformity. Neurologic exam:  Cognition: keeps eyes closed.  Oriented to person, place, Poor insight. Can perseverate. Can be distracted by environment Sensation: To light touch intact in BL UEs and LEs  CN: 2-12 grossly intact.  Coordination:  Bilateral upper extremity ataxia, left greater than right.  Strength: 5 out of 5 strength throughout bilateral upper extremities 5 out of 5 strength in right lower extremity 4+ out of 5 strength throughout left lower extremity--no change   Assessment/Plan: 1. Functional deficits which require 3+ hours per day of interdisciplinary therapy in a comprehensive inpatient rehab setting. Physiatrist is providing close team  supervision and 24 hour management of active medical problems listed below. Physiatrist and rehab team continue to assess barriers to discharge/monitor patient progress toward functional and medical goals  Care Tool:  Bathing  Bathing activity did not occur: Refused Body parts bathed by patient: Face   Body parts bathed by helper: Right arm, Left arm, Chest, Abdomen, Front perineal area, Buttocks, Right upper leg, Left upper leg, Left lower leg     Bathing assist Assist Level: Total Assistance - Patient < 25%     Upper Body Dressing/Undressing Upper body dressing   What is the patient wearing?: Button up shirt    Upper body assist Assist Level: Moderate Assistance - Patient 50 - 74%    Lower Body Dressing/Undressing Lower body dressing      What is the patient wearing?: Pants, Underwear/pull up     Lower body assist Assist for lower body dressing: Maximal Assistance - Patient 25 - 49%     Toileting Toileting Toileting Activity did not occur (Clothing management and hygiene only): N/A (no void or bm)  Toileting assist Assist for toileting: Moderate Assistance - Patient 50 - 74%     Transfers Chair/bed transfer  Transfers  assist     Chair/bed transfer assist level: Moderate Assistance - Patient 50 - 74% (stedy)     Locomotion Ambulation   Ambulation assist      Assist level: 2 helpers Assistive device: Other (comment) (eva walker) Max distance: 14'   Walk 10 feet activity   Assist  Walk 10 feet activity did not occur: Safety/medical concerns  Assist level: 2 helpers Assistive device:  (eva walker)   Walk 50 feet activity   Assist Walk 50 feet with 2 turns activity did not occur: Safety/medical concerns  Assist level: 2 helpers Assistive device:  (eva walker)    Walk 150 feet activity   Assist Walk 150 feet activity did not occur: Safety/medical concerns         Walk 10 feet on uneven surface  activity   Assist Walk 10 feet on  uneven surfaces activity did not occur: Safety/medical concerns         Wheelchair     Assist Is the patient using a wheelchair?: Yes Type of Wheelchair: Manual (TIS)    Wheelchair assist level: Total Assistance - Patient < 25%      Wheelchair 50 feet with 2 turns activity    Assist        Assist Level: Total Assistance - Patient < 25%   Wheelchair 150 feet activity     Assist      Assist Level: Total Assistance - Patient < 25%   Blood pressure 107/60, pulse 73, temperature (!) 97.5 F (36.4 C), resp. rate 16, height 6' (1.829 m), weight 84.3 kg, SpO2 98%.  1. 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  Antithrombotics: -DVT/anticoagulation:  Pharmaceutical: Heparin 5000U q8h -antiplatelet therapy: Aspirin and Plavix for three weeks followed by aspirin alone>>last dose of Plavix 8/30.    3. Pain Management: chronic back pain -Tylenol and Robaxin as needed, Voltaren QID, lidoderm daily             -continue tramadol 50 mg TID (ordered as q6h PRN currently) -Patient reporting chronic pain in multiple areas of his body, continue low-dose gabapentin 100 mg 3 times daily--may need to consider backing off he remains lethargic   4. Mood/Behavior/Sleep/delirium: LCSW to evaluate and provide emotional support             -continue trazodone 50mg  q HS             -antipsychotic agents: reduce Zyprexa to 2.5 mg daily (already got 9/9 dose) -9/9 no new scheduled meds at bedtime currently   -will change timing of trazodone to 2100 5. Neuropsych/cognition: This patient is not quite capable of making decisions on his own behalf.   6. Skin/Wound Care: Routine skin care checks   7.  Fluids/Electrolytes/Nutrition: Routine Is and Os and follow-up chemistries             -dys 3 with thin liquids             -SLP eval, cont vitamins   I personally reviewed the patient's labs today.   8: Hypertension: monitor TID and prn (home Cozaar 25 mg daily held)             -  continue metoprolol 50 mg BID   -9/7-8/24 controlled continue current regimen Vitals:   07/19/23 2021 07/20/23 0451 07/20/23 1309 07/20/23 1926  BP: 105/63 (!) 140/72 (!) 115/57 137/69   07/21/23 0432 07/21/23 1403 07/21/23 1932 07/22/23 0407  BP: 122/71 131/79 116/69 120/64   07/22/23 1732 07/22/23 1908 07/23/23 0255 07/23/23 0829  BP: (!) 127/59 134/76 (!) 141/70 107/60     9: Hyperlipidemia: statin held (follow-up LFTs and restart atorvastatin 40 mg as appropriate)   10: Hypothyroidism: continue Synthroid daily   11: Acute cholecystitis s/p cholecystostomy tube             -routine drain care. Site is fine             -continue Levaquin 250 mg daily   12: DM-2: CBGs QID; A1c = 8.8%; on insulin at home             -continue SSI  -9/6 CBGs little higher today, continue to monitor trend for now  -07/22/23 CBGs a little worse today, looks like he was on toujeo 54U nightly before admission and is now eating better; will restart semglee at 5U nightly to start-- verified with pharmacy that this would be an ok start.   -9/9 observe CBG's today CBG (last 3)  Recent Labs    07/22/23 2034 07/23/23 0600 07/23/23 0827  GLUCAP 91 139* 164*      13: Hx of CAD s/p CABG 2016: on Plavix, asa, BB; ? resume statin   14: CKD IIIa: baseline Cr ~1.3             -follow-up BMP  9/3--Cr 1.54, near baseline---no changes  -encourage appropriate fluids  9/9 BUN/ CR trending up. ---push fluids  -recheck bmet tomorrow  -might need IVF  15: GI prophylaxis: continue Protonix 40mg  daily   16.  Bowel and bladder incontinence.    PVRs. -last Bms noted to be smears on 9/2 and 9/1, schedule miralax for constipation  (see below) -07/21/23 PVRs not well documented, but seems to have intermittent incontinence; monitor -07/22/23 only one PVR documented; still intermittent incontinence; defer to weekday team on this topic.   17. R knee pain  -Start voltaren gel, consider xray if not improved by tomorrow  18. Malnutrition, severe  -9/5 RD following, appreciate assistance, continue to encourage PO intake   19. Constipation  -9/5 continue miralax increase to BID, Check ABD xray  -9/6 moderate stool burden, will order sorbitol x1 -9/9 still no bm since 9/5 (smears)  -sorbitol 60cc today  -SSE if needed this afternoon  LOS: 9 days A FACE TO FACE EVALUATION WAS PERFORMED  Ranelle Oyster 07/23/2023, 10:52 AM

## 2023-07-23 NOTE — Progress Notes (Signed)
Physical Therapy Weekly Progress Note  Patient Details  Name: Hunter Sparks MRN: [id number] Date of Birth: [birth year]  Beginning of progress report period: July 15, 2023 End of progress report period: July 23, 2023  Today's Date: 07/23/2023 PT Individual Time: 0923-1020+1101-1130 PT Individual Time Calculation (min): 57 min + 29 min  Patient has met 3 of 4 short term goals.  Pt is making slow progress towards functional goals. Pt currently requires mod assist for sit to stands, ambulates with [name] walker 68' with mod assist x1, self propels WC with mod assist using BLEs 60'. Pt progress has been limited due to variable lethargy during the day. Pt performance varies based on fatigue level and can require up to 2 person assist for functional mobility when lethargic.  Patient continues to demonstrate the following deficits muscle weakness, decreased cardiorespiratoy endurance, impaired timing and sequencing, abnormal tone, unbalanced muscle activation, decreased coordination, and decreased motor planning, decreased visual perceptual skills, decreased midline orientation, decreased attention to left, and decreased motor planning, decreased attention, decreased awareness, decreased problem solving, decreased safety awareness, and decreased memory, and decreased sitting balance, decreased standing balance, decreased postural control, hemiplegia, and decreased balance strategies and therefore will continue to benefit from skilled PT intervention to increase functional independence with mobility.  Patient progressing toward long term goals..  Continue plan of care.  PT Short Term Goals Week 1:  PT Short Term Goal 1 (Week 1): pt will maintain dynamic seated balance with mod A PT Short Term Goal 1 - Progress (Week 1): Met PT Short Term Goal 2 (Week 1): Pt will perform bed to chair transfer with LRAD and mod A PT Short Term Goal 2 - Progress (Week 1): Progressing toward goal PT Short Term Goal 3  (Week 1): pt will perform sit to stand with LRAD and mod A PT Short Term Goal 3 - Progress (Week 1): Met PT Short Term Goal 4 (Week 1): pt will ambulated 20 feet with LRAD and max A PT Short Term Goal 4 - Progress (Week 1): Met Week 2:  PT Short Term Goal 1 (Week 2): Pt will complete bed<>WC transfer with mod assist and LRAD PT Short Term Goal 2 (Week 2): Pt will ambulate with RW 68' with mod assist x1 PT Short Term Goal 3 (Week 2): Pt will complete WC mobility min assist 50' PT Short Term Goal 4 (Week 2): Pt will maintain standing balance with min assist x1 and LRAD  Skilled Therapeutic Interventions/Progress Updates:     SESSION 1: Pt presents in bed, asleep, difficulty waking up initially but once awake pt able to remain awake. Pt requires gentle orientation and education to participate with therapies this session due to pt with frustration over medications and being sleepy as well as decreased insight into deficits. Session focused on participation with self care tasks, improving alertness during daytime hours with therapeutic use of environment, NMR to promote BLE muscle fiber recruitment needed for functional transfers and ambulation, and WC mobility training.  Pt completes bed mobility with supervision. While seated EOB pt dons pants, threading LLE then RLE with supervision however pt does demonstrate increased time to complete task and x2 posterior slow LOB, able to correct trunk to upright without assistance. Pt completes stand to stedy with min assist and able to manage pants over hips in standing with min assist for postural stability. Pt transferred to [facility] via stedy and positioned in front of mirror to complete self care tasks including washing face, dentures, and  combing hair, all completed with supervision. Pt changes shirt while seated in WC with set up assist and supervision, cues for direction of shirt. Therapist dons pt shoes and socks total assist for time management.  Pt then  transported via WC outside to sit in sunshine, to improve pt alertness during daytime hours and allow for improved sleep at nighttime. While outside pt completes NMR for BLE muscle fiber recruitment while in sitting position including: - marchings RLE then LLE x10 - marching alternating BLE x20 - LAQs RLE then LLE x10 (external cues to achieve desired ROM with verbal cues) - LAQs alternating BLE x20 - ankle DF x20  Pt transported back to day room dependently for time management, completes WC mobility 34' with BLEs mod assist for initiating and maintaining momentum, mod to max verbal cues with fatigue for sequencing LLE with task. Completed to promote improved reciprocal movements needed for functional ambulation as well as to improve independence from seated positioning.   Pt returns to room and remains seated in [facility] with all needs within reach, cal light in place and chair alarm donned and activated at end of session.   SESSION 2: Pt presents in room in [facility] speaking to wife on phone, pt agreeable to PT. Pt reporting some pain in low back but manageable. Session focused on gait training for tolerance to upright and dynamic standing balance.  Pt transported to main gym in [facility] for time management and energy conservaiton. Pt completes stand to eva walker with min assist demonstrating good upright posture and full terminal knee extension initially. Pt ambulates 2x73' with eva walker, mod assist x1 for controlling RW and creating weightshift for LLE advance. Pt demonstrates increased bilateral knee flexion with fatigue with max verbal cues to correct. Pt completes sit<>stands x3 for mass practice, verbal cues for BLE position and RUE on WC armrest, completes with light min assist and CGA/min assist for standing balance with eva walker.   Pt returns to room and remains seated in [facility] with all needs within reach, cal light in place and chair alarm donned and activated at end of session.   Therapy  Documentation Precautions:  Precautions Precautions: Fall Precaution Comments: left hemiparesis, pusher Restrictions Weight Bearing Restrictions: No Other Position/Activity Restrictions: Rt sided percutaneous drain  Therapy/Group: Individual Therapy  Edwin Cap PT, DPT 07/23/2023, 12:10 PM

## 2023-07-23 NOTE — Progress Notes (Signed)
SLP Cancellation Note  Patient Details Name: Hunter Sparks MRN: [id number] DOB: 03-01-1946   Cancelled treatment:        SLP attempted to conduct skilled therapy session targeting cognitive retraining goals. Patient received in bed, somnolent. SLP attempted to rouse patient first alone, then with assistance of nursing student, Hospital doctor. Together, treatment team attempted to rouse utilizing verbal, auditory, and tactile cues and repositioned patient upright to 90 degrees with no change in alertness. Patient left with nursing staff. SLP will continue to follow per POC.                                                Jeannie Done, M.A., CCC-SLP                                              [name] 07/23/2023, 8:19 AM

## 2023-07-23 NOTE — Progress Notes (Signed)
Query by pharmacy about length of Levaquin therapy. Messaged GS service and told to discontinue.

## 2023-07-23 NOTE — Progress Notes (Signed)
Patient refused medications until he eats; refused breakfast and refused to order food ; Patient claims he will wait for his son for some food. MD aware.

## 2023-07-23 NOTE — Plan of Care (Signed)
  Problem: RH BOWEL ELIMINATION Goal: RH STG MANAGE BOWEL WITH ASSISTANCE Description: STG Manage Bowel with min Assistance. Outcome: Not Progressing; sorbitol given LBM 9/5   Problem: RH BLADDER ELIMINATION Goal: RH STG MANAGE BLADDER WITH ASSISTANCE Description: STG Manage Bladder With min Assistance Outcome: Not Progressing; incontinence   Problem: RH SAFETY Goal: RH STG ADHERE TO SAFETY PRECAUTIONS W/ASSISTANCE/DEVICE Description: STG Adhere to Safety Precautions With cueing Assistance/Device. Outcome: Not Progressing; telestter continued

## 2023-07-23 NOTE — Progress Notes (Signed)
Occupational Therapy Weekly Progress Note  Patient Details  Name: Hunter Sparks MRN: [id number] Date of Birth: 04-30-1946  Beginning of progress report period: July 15, 2023 End of progress report period: July 23, 2023  Today's Date: 07/23/2023 OT Individual Time: 7846-9629 OT Individual Time Calculation (min): 45 min    Patient has met 2 of 5 short term goals.  Patient has made slow but steady progress this week. He was limited last week due to lethargy. He can stand with as little as min A, but can need up to mod A depending on fatigue. Pushing has Improved with improved midline orientation overall. He is still at a mod A overall for transfers and dynamic balance within functional tasks. Continue current plan of care.   Patient continues to demonstrate the following deficits: muscle weakness, decreased cardiorespiratoy endurance, impaired timing and sequencing, unbalanced muscle activation, and decreased coordination, decreased motor planning, decreased attention, decreased awareness, decreased problem solving, decreased safety awareness, and decreased memory, and decreased sitting balance, decreased standing balance, decreased postural control, and decreased balance strategies and therefore will continue to benefit from skilled OT intervention to enhance overall performance with BADL and Reduce care partner burden.  Patient progressing toward long term goals..  Continue plan of care.  OT Short Term Goals Week 1:  OT Short Term Goal 1 (Week 1): Patient will don pull over or front opening shirt with min assist OT Short Term Goal 1 - Progress (Week 1): Met OT Short Term Goal 2 (Week 1): Patient will identify need to void, and trasnfer to commode with min assist OT Short Term Goal 2 - Progress (Week 1): Progressing toward goal OT Short Term Goal 3 (Week 1): Patient will don lower body clothing with mod assist OT Short Term Goal 3 - Progress (Week 1): Progressing toward goal OT Short  Term Goal 4 (Week 1): Patient will stand and free one hand at a time to help manage pants up or down during toileting OT Short Term Goal 4 - Progress (Week 1): Progressing toward goal OT Short Term Goal 5 (Week 1): Patient will transfer sit to stand with min assist in preparation for transfer OT Short Term Goal 5 - Progress (Week 1): Met Week 2:  OT Short Term Goal 1 (Week 2): Patient will stand and free one hand at a time to help manage pants up or down during toileting OT Short Term Goal 2 (Week 2): Patient will identify need to void, and trasnfer to commode with min assist OT Short Term Goal 3 (Week 2): Pt will maintain dynamic standing balance with min A for 2 minutes in preparation for BADL tasks  Skilled Therapeutic Interventions/Progress Updates:    Pt greeted seated in [facility]IS wc and agreeable to OT treatment session. Pt brought to therapy gym and addressed sit<>stands and weight shifting to the R in standing using cone reaching activity. Sit<>stands were at mostly a min A with min to mod A for balance. Good weight shift R with improved overall midline orientation. Attempted sit<>stands without device with pt reporting increased dizziness and need to return to sitting.  BP sitting: 141/87 HR: 110 BP standing: 152/127. Pt requested to return to bed due to dizziness and fatigue. Mod A squat-pivot back to bed. Pt left semi-reclined in bed with bed alarm on, call bell in reach, and needs met.   Therapy Documentation Precautions:  Precautions Precautions: Fall Precaution Comments: left hemiparesis, pusher Restrictions Weight Bearing Restrictions: No Other Position/Activity Restrictions: Rt sided percutaneous  drain Pain:  Denies pain, reports dizziness    Therapy/Group: Individual Therapy  [name] 07/23/2023, 1:35 PM

## 2023-07-24 DIAGNOSIS — I10 Essential (primary) hypertension: Secondary | ICD-10-CM | POA: Diagnosis not present

## 2023-07-24 DIAGNOSIS — I639 Cerebral infarction, unspecified: Secondary | ICD-10-CM | POA: Diagnosis not present

## 2023-07-24 DIAGNOSIS — N1831 Chronic kidney disease, stage 3a: Secondary | ICD-10-CM | POA: Diagnosis not present

## 2023-07-24 DIAGNOSIS — M545 Low back pain, unspecified: Secondary | ICD-10-CM

## 2023-07-24 DIAGNOSIS — E1122 Type 2 diabetes mellitus with diabetic chronic kidney disease: Secondary | ICD-10-CM | POA: Diagnosis not present

## 2023-07-24 LAB — GLUCOSE, CAPILLARY
Glucose-Capillary: 226 mg/dL — ABNORMAL HIGH (ref 70–99)
Glucose-Capillary: 171 mg/dL — ABNORMAL HIGH (ref 70–99)
Glucose-Capillary: 160 mg/dL — ABNORMAL HIGH (ref 70–99)
Glucose-Capillary: 121 mg/dL — ABNORMAL HIGH (ref 70–99)

## 2023-07-24 MED ORDER — ACETAMINOPHEN 325 MG PO TABS
650.0000 mg | ORAL_TABLET | Freq: Three times a day (TID) | ORAL | Status: DC
  Administered 2023-07-24 – 2023-07-29 (×16): 650 mg via ORAL
  Filled 2023-07-24 (×17): qty 2

## 2023-07-24 MED ORDER — SODIUM CHLORIDE 0.9 % IV SOLN: Freq: Every day | INTRAVENOUS | Status: DC

## 2023-07-24 NOTE — Progress Notes (Signed)
Physical Therapy Session Note  Patient Details  Name: Hunter Sparks MRN: [id number] Date of Birth: 1946/04/12  Today's Date: 07/24/2023 PT Individual Time: 0809-0905 PT Individual Time Calculation (min): 56 min   Short Term Goals: Week 2:  PT Short Term Goal 1 (Week 2): Pt will complete bed<>WC transfer with mod assist and LRAD PT Short Term Goal 2 (Week 2): Pt will ambulate with RW 67' with mod assist x1 PT Short Term Goal 3 (Week 2): Pt will complete WC mobility min assist 50' PT Short Term Goal 4 (Week 2): Pt will maintain standing balance with min assist x1 and LRAD  Skilled Therapeutic Interventions/Progress Updates:    Pt presents in room seated EOB at foot of bed with RN present. Pt reporting pain in low back but declines intervention and agreeable to PT. Session focused on gait training and NMR for LLE stability in stance phase and clearance in swing phase.  Pt completes sit to stand with min assist, completes stand pivot to L to [facility] with light mod assist for turning hips completely to sit. Pt transported dependently to main gym.  Pt ambulates with eva walker 33' with mod assist and max verbal cues for LLE swing phase, completes x2 turns with mod assist for managing walker and for postural stability with pt demonstrating increasing bilateral knee flexion with fatigue.  Pt completes sit<>stand to eva walker with min assist, completes 2x10 taps to 6" bar on eva walker to promote BLE foot clearance and stability with stance phase.  Pt then completes sit<>stand to RW min assist and completes marching alternating BLE x10, demonstrates excessive L lateral lean with fatigue requiring up to mod assist for postural stability in standing. Pt provided with mirror for visual cue and completes 2x10 alternating marches BLE with max verbal cues for looking upright and correcting L lateral lean. Pt with low tolerance to frustration with this exercise, demonstrates decreased insight into deficits and  resistant to education on deficits post stroke. Pt requires increased rest time between exercises for improved participation and for education.  Pt returns to room and remains seated in [facility] with all needs within reach, cal light in place and chair alarm donned and activated at end of session.  Therapy Documentation Precautions:  Precautions Precautions: Fall Precaution Comments: left hemiparesis, pusher Restrictions Weight Bearing Restrictions: No Other Position/Activity Restrictions: Rt sided percutaneous drain    Therapy/Group: Individual Therapy  Edwin Cap PT, DPT 07/24/2023, 10:50 AM

## 2023-07-24 NOTE — Progress Notes (Signed)
Patient ID: Hunter Sparks, male   DOB: 10-31-1946, 77 y.o.   MRN: [id number]  SW met with pt and pt wife in room to provide updates from team conference and d/c date 9/21. Fam edu scheduled for Thursday (9/12) 9am-12pm, and Wednesday (9/18) 9am-12pm. Wife reports tp will need RW. Reports taht their son is working on building a ramp, walk-in shoer, installing raised toilet seat, and grab bar by the toilet. States this will all be completed by time of discharge. SW discussed HH therapies at discharge. Preferred [facility]- [facility] and [facility].   SW sent HHPT/OT/SLP/SN/Aide referral to [facility] and waiting on follow-up.   Cecile Sheerer, MSW, LCSWA Office: [phone] Cell: [phone] Fax: [phone]

## 2023-07-24 NOTE — Progress Notes (Signed)
PROGRESS NOTE   Subjective/Complaints:  Patient reports having lower back pain today, says the bed is not comfortable.  ROS: Limited due to cognitive/behavioral   Objective:   No results found. Recent Labs    07/23/23 0652  WBC 6.6  HGB 13.4  HCT 40.1  PLT 322    Recent Labs    07/23/23 0652  NA 138  K 3.9  CL 103  CO2 27  GLUCOSE 204*  BUN 26*  CREATININE 1.55*  CALCIUM 9.1     Intake/Output Summary (Last 24 hours) at 07/24/2023 1241 Last data filed at 07/24/2023 0540 Gross per 24 hour  Intake 450 ml  Output 465 ml  Net -15 ml        Physical Exam: Vital Signs Blood pressure (!) 148/80, pulse (!) 109, temperature 97.7 F (36.5 C), temperature source Oral, resp. rate 18, height 6' (1.829 m), weight 75 kg, SpO2 98%.  Constitutional: No distress . Vital signs reviewed.  Sitting in bed working with therapy HEENT: NCAT, EOMI, oral membranes moist Neck: supple Cardiovascular: RRR without murmur. No JVD    Respiratory/Chest: CTA Bilaterally without wheezes or rales. Normal effort    GI/Abdomen: BS +, non-tender, non-distended Ext: no clubbing, cyanosis, or edema Psych: flat, slow to process Skin: C/D/I. No apparent lesions. Drain site intact MSK:      No apparent deformity. Neurologic exam:  Cognition: Awake and alert.  Oriented to person, place, Poor insight. Can perseverate. Can be distracted by environment Sensation: To light touch intact in BL UEs and LEs  CN: 2-12 grossly intact.  Coordination:  Bilateral upper extremity ataxia, left greater than right.  Strength: 5 out of 5 strength throughout bilateral upper extremities 5 out of 5 strength in right lower extremity 4+ out of 5 strength throughout left lower extremity--no change   Assessment/Plan: 1. Functional deficits which require 3+ hours per day of interdisciplinary therapy in a comprehensive inpatient rehab setting. Physiatrist is  providing close team supervision and 24 hour management of active medical problems listed below. Physiatrist and rehab team continue to assess barriers to discharge/monitor patient progress toward functional and medical goals  Care Tool:  Bathing  Bathing activity did not occur: Refused Body parts bathed by patient: Face   Body parts bathed by helper: Right arm, Left arm, Chest, Abdomen, Front perineal area, Buttocks, Right upper leg, Left upper leg, Left lower leg     Bathing assist Assist Level: Total Assistance - Patient < 25%     Upper Body Dressing/Undressing Upper body dressing   What is the patient wearing?: Button up shirt    Upper body assist Assist Level: Moderate Assistance - Patient 50 - 74%    Lower Body Dressing/Undressing Lower body dressing      What is the patient wearing?: Pants, Underwear/pull up     Lower body assist Assist for lower body dressing: Maximal Assistance - Patient 25 - 49%     Toileting Toileting Toileting Activity did not occur (Clothing management and hygiene only): N/A (no void or bm)  Toileting assist Assist for toileting: Moderate Assistance - Patient 50 - 74%     Transfers Chair/bed transfer  Transfers assist  Chair/bed transfer assist level: Moderate Assistance - Patient 50 - 74% (via stedy)     Locomotion Ambulation   Ambulation assist      Assist level: Moderate Assistance - Patient 50 - 74% Assistive device: Gardiner Coins distance: 42'   Walk 10 feet activity   Assist  Walk 10 feet activity did not occur: Safety/medical concerns  Assist level: Moderate Assistance - Patient - 50 - 74% Assistive device: Walker-Eva   Walk 50 feet activity   Assist Walk 50 feet with 2 turns activity did not occur: Safety/medical concerns  Assist level: Moderate Assistance - Patient - 50 - 74% Assistive device: Walker-Eva    Walk 150 feet activity   Assist Walk 150 feet activity did not occur: Safety/medical  concerns         Walk 10 feet on uneven surface  activity   Assist Walk 10 feet on uneven surfaces activity did not occur: Safety/medical concerns         Wheelchair     Assist Is the patient using a wheelchair?: Yes Type of Wheelchair: Manual    Wheelchair assist level: Moderate Assistance - Patient 50 - 74% Max wheelchair distance: 47'    Wheelchair 50 feet with 2 turns activity    Assist        Assist Level: Moderate Assistance - Patient 50 - 74%   Wheelchair 150 feet activity     Assist      Assist Level: Total Assistance - Patient < 25%   Blood pressure (!) 148/80, pulse (!) 109, temperature 97.7 F (36.5 C), temperature source Oral, resp. rate 18, height 6' (1.829 m), weight 75 kg, SpO2 98%.  1. 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  Team conference note completed.   2.  Antithrombotics: -DVT/anticoagulation:  Pharmaceutical: Heparin 5000U q8h -antiplatelet therapy: Aspirin and Plavix for three weeks followed by aspirin alone>>last dose of Plavix 8/30.    3. Pain Management: chronic back pain -Tylenol and Robaxin as needed, Voltaren QID, lidoderm daily             -continue tramadol 50 mg TID (ordered as q6h PRN currently) -Patient reporting chronic pain in multiple areas of his body, continue low-dose gabapentin 100 mg 3 times daily--may need to consider backing off he remains lethargic -9/10 lower back pain today.  Will schedule Tylenol, do not know if he remembers to ask for it.   4. Mood/Behavior/Sleep/delirium: LCSW to evaluate and provide emotional support             -continue  trazodone 50mg  q HS             -antipsychotic agents: reduce Zyprexa to 2.5 mg daily (already got 9/9 dose) -9/9 no new scheduled meds at bedtime currently   -will change timing of trazodone to 2100 5. Neuropsych/cognition: This patient is not quite capable of making decisions on his own behalf.   6. Skin/Wound Care: Routine skin care checks   7. Fluids/Electrolytes/Nutrition: Routine Is and Os and follow-up chemistries             -dys  3 with thin liquids             -SLP eval, cont vitamins   I personally reviewed the patient's labs today.   8: Hypertension: monitor TID and prn (home Cozaar 25 mg daily held)             -continue metoprolol 50 mg BID   -9/10 BP controlled, continue current regimen Vitals:   07/20/23 1926 07/21/23 0432 07/21/23 1403 07/21/23 1932  BP: 137/69 122/71 131/79 116/69   07/22/23 0407 07/22/23 1732 07/22/23 1908 07/23/23 0255  BP: 120/64 (!) 127/59 134/76 (!) 141/70   07/23/23 0829 07/23/23 1503 07/23/23 1936 07/24/23 0500  BP: 107/60 125/69 136/75 (!) 148/80     9: Hyperlipidemia: statin held (follow-up LFTs and restart atorvastatin 40 mg as appropriate)   10: Hypothyroidism: continue Synthroid daily   11: Acute cholecystitis s/p cholecystostomy tube             -routine drain care. Site is fine             -continue Levaquin 250 mg daily   12: DM-2: CBGs QID; A1c = 8.8%; on insulin at home             -continue SSI  -9/6 CBGs little higher today, continue to monitor trend for now  -07/22/23 CBGs a little worse today, looks like he was on toujeo 54U nightly before admission and is now eating better; will restart semglee at 5U nightly to start-- verified with pharmacy that this would be an ok start.   -9/10 CBD use with fair control, continue to monitor today and consider increase of Semglee tomorrow  CBG (last 3)  Recent Labs    07/23/23 2109 07/24/23 0632 07/24/23 1146  GLUCAP 124* 226* 171*      13: Hx of CAD s/p CABG 2016: on  Plavix, asa, BB; ? resume statin   14: CKD IIIa: baseline Cr ~1.3             -follow-up BMP  9/3--Cr 1.54, near baseline---no changes  -encourage appropriate fluids  9/9 BUN/ CR trending up. ---push fluids  -recheck bmet tomorrow  -might need IVF 9/10 creatinine 1.55 although BUN up to 26.  Will add some IVF normal saline at night  15: GI prophylaxis: continue Protonix 40mg  daily   16.  Bowel and bladder incontinence.    PVRs. -last Bms noted to be smears on 9/2 and 9/1, schedule miralax for constipation (see below) -07/21/23 PVRs not well documented, but seems to have intermittent incontinence; monitor -07/22/23 only one PVR documented; still intermittent incontinence; defer to weekday team on this topic.   17. R knee pain  -Start voltaren gel, consider xray if not improved by tomorrow  18. Malnutrition, severe  -9/5 RD following, appreciate assistance, continue to encourage PO intake   19. Constipation  -9/5 continue miralax increase to BID, Check ABD xray  -9/6 moderate stool burden, will order sorbitol x1 -9/9 still no bm since 9/5 (smears)  -sorbitol 60cc today  -SSE if needed this afternoon -9/10 last bowel movement yesterday  LOS: 10 days A FACE TO FACE EVALUATION WAS PERFORMED  Fanny Dance 07/24/2023, 12:41 PM

## 2023-07-24 NOTE — Progress Notes (Signed)
Occupational Therapy Session Note  Patient Details  Name: Hunter Sparks MRN: [id number] Date of Birth: 1945-12-29  Today's Date: 07/24/2023 OT Individual Time: 1346-1430 OT Individual Time Calculation (min): 44 min    Short Term Goals: Week 2:  OT Short Term Goal 1 (Week 2): Patient will stand and free one hand at a time to help manage pants up or down during toileting OT Short Term Goal 2 (Week 2): Patient will identify need to void, and trasnfer to commode with min assist OT Short Term Goal 3 (Week 2): Pt will maintain dynamic standing balance with min A for 2 minutes in preparation for BADL tasks  Skilled Therapeutic Interventions/Progress Updates:     Pt received semi-reclined in bed with wife present in room. Pt presenting with flat affect and to be fatigued, however Pt receptive to skilled OT session reporting 0/10 pain- OT offering intermittent rest breaks, repositioning, and therapeutic support to optimize participation in therapy session. Pt stating he has not been sleeping well since being in the hospital d/t unfamiliar environment. Provided therapeutic support and education on sleep hygiene with Pt and Pt's wife receptive to education. Pt transitioned to EOB with HOB elevated with min A and increased time. Utilized stedy for transportation to [facility] with pt able to complete sit>stand using stedy grab bar with min A. Transported Pt total A to therapy gym in [place] for time management and energy conservation. Positioned Pt in front of vertical mirror to provide external visual feedback for improved body awareness and motor learning. Worked on blocked practice of sit<>stands with L UE positioned on grab bar and pushing up on wc armrest through R UE. Pt able to complete sit<>stands with min A x5 trials with OT providing tactile cues for L LE engagement. Worked on standing tolerance and lateral weight shifting in standing with mod tactile/verbal cues provided for trunk/head positioning and  midline awareness. Pt able to maintain stand for 1-2 minutes x5 trials with increased time for rest breaks provided between trials. Engaged Pt in completing functional reaching in standing with Pt instructed to cross midline using opposite hand to reach for OT's hand. Graded up challenge of task from weight shifting to completing standing marches with B UE supported on grab bar with Pt able to complete 1x10 reps with min A provided for balance and mod cues for weight shifting- Pt with decreased step height during activity. Engaged pt in dynamic standing balance activity at elevated table to weight bearing through L UE and functional reaching facilitated into activity- Pt tasked with maintaining standing balance while reaching across midline using L UE to retrieve cones and stack them on his L side with min A provided for balance and OT stabilizing L knee. Pt then reversed  the task using R UE to retrieve cones while WB'ing through L UE. Pt fatigued at end of session- transported back to room total A in TISWC. Pt was left resting in TISWC with call bell in reach, seat belt alarm on, telesitter on and all needs met.    Therapy Documentation Precautions:  Precautions Precautions: Fall Precaution Comments: left hemiparesis, pusher Restrictions Weight Bearing Restrictions: No Other Position/Activity Restrictions: Rt sided percutaneous drain   Therapy/Group: Individual Therapy  Clide Deutscher 07/24/2023, 7:58 AM

## 2023-07-24 NOTE — Plan of Care (Signed)
  Problem: Consults Goal: RH STROKE PATIENT EDUCATION Description: See Patient Education module for education specifics  Outcome: Progressing   Problem: RH BOWEL ELIMINATION Goal: RH STG MANAGE BOWEL WITH ASSISTANCE Description: STG Manage Bowel with min Assistance. Outcome: Progressing   Problem: RH BLADDER ELIMINATION Goal: RH STG MANAGE BLADDER WITH ASSISTANCE Description: STG Manage Bladder With min Assistance Outcome: Progressing

## 2023-07-24 NOTE — Progress Notes (Addendum)
Nutrition Follow-up  DOCUMENTATION CODES:   Severe malnutrition in context of acute illness/injury  INTERVENTION:  -Continue Dys 3 diet.   - Continue MVI.   - Recommend addition of appetite stimulant (discussed with MD).   - Continue additional chocolate milk on trays.   NUTRITION DIAGNOSIS:   Severe Malnutrition related to acute illness as evidenced by moderate muscle depletion, energy intake < or equal to 50% for > or equal to 5 days.  GOAL:   Patient will meet greater than or equal to 90% of their needs - Not met.   MONITOR:   PO intake, Weight trends  REASON FOR ASSESSMENT:   Malnutrition Screening Tool    ASSESSMENT:   77 y.o. 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 PMH includes: angina, arthritis, depression, DJD, HLD, HTN, hypothyroidism, T2DM.  Meds reviewed: sliding scale insulin, semglee, synthroid, MVI, miralax, senokot. Labs reviewed: BUN/Creatinine elevated.   Pt is oriented x2. Pt continues with poor to fair appetite and intakes. Per record, Pt has eaten an average of 50% of his meals over the past 7 days. RD reached out to MD about adding an appetite stimulant. Pt seems to be doing well when family brings in food from outside of the hospital. RD will continue to closely monitor PO intakes. Pt refused all supplements at last assessment.   Diet Order:   Diet Order             DIET DYS 3 Room service appropriate? Yes with Assist; Fluid consistency: Thin  Diet effective now                   EDUCATION NEEDS:   Not appropriate for education at this time  Skin:  Skin Assessment: Reviewed RN Assessment  Last BM:  9/9  Height:   Ht Readings from Last 1 Encounters:  07/14/23 6' (1.829 m)    Weight:   Wt Readings from Last 1 Encounters:  07/23/23 75 kg    Ideal Body Weight:     BMI:  Body mass index is 22.42 kg/m.  Estimated Nutritional Needs:   Kcal:   2100-2500 kcals  Protein:  105-125 gm  Fluid:  >/= 2.1 L  Bethann Humble, RD, LDN, CNSC.

## 2023-07-24 NOTE — Patient Care Conference (Signed)
Inpatient RehabilitationTeam Conference and Plan of Care Update Date: 07/24/23   Time 10:49 AM   Patient Name: Hunter Sparks      Medical Record Number: [id number]  Date of Birth: 1946/08/23 Sex: Male         Room/Bed: 4W20C/4W20C-01 Payor Info: Payor: [employer] / Plan: [employer] PPO / Product Type: *No Product type* /    Admit Date/Time:  07/14/2023  5:03 PM  Primary Diagnosis:  Acute CVA (cerebrovascular accident) [facility])  Hospital Problems: Principal Problem:   Acute CVA (cerebrovascular accident) [facility])    Expected Discharge Date: Expected Discharge Date: 08/04/23  Team Members Present: Physician leading conference: Dr. 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 Continue working on postural control in standing, gait training, transfer training, LLE NMR, global endurance    Communication                Safety/Cognition/ Behavioral Observations  modA overall   Supervision   orientation and recall  with use of strategies/memory aids, selective attention, functional problem solving    Pain   no complaints of pain   to have control to no pain   reassessment of pain needs    Skin   scaterred bruising, no pressure sore,able to turn on sides   to have no pressure sores or MASD  skin reassessment      Discharge Planning:  D/c remains home with wife who will be primary caregiver. Wife is unable to provide any physical assistance due to COPD. She would like him to be able to ambulate with an AD. SW will confirm there are no barriers to discharge.   Team Discussion: Acute CVA. Tele-sitter for safety. Time toileting. Constipation improving. Denies pain. Skin is clean, dry and intact. Biliary drain to RUQ with greater than output daily.  On-going education regarding flushing of drain and recording output once home. Was very sleepy this AM. Family asking to get rid of unnecessary medications.  Blood pressure looks good. Does not like the hospital food.  Family bringing food in for patient to eat.  Encouraging fluid intake. Increase in BUN/Cr.  AC/HS.  Fatigues quickly. Pushing is getting better. Making slow gains.  Patient on target to meet rehab goals: yes, progressing towards goals with discharge date of 08/04/23  *See Care Plan and progress notes for long and short-term goals.   Revisions to Treatment Plan:  Medication adjustments.  IV fluids. Monitor labs/VS Teaching Needs: Medications, safety, self care, gait/transfer  training, skin care, etc.   Current Barriers to Discharge: Decreased caregiver support, Incontinence, and Behavior  Possible Resolutions to Barriers: Family education Regain control of bowel/bladder Able to self monitor behaviors with cueing Order recommended DME     Medical Summary Current Status: CVA,pain, DM2, CKD IIIa, incontinence, constipation  Barriers to Discharge: Medical stability;Uncontrolled Pain;Inadequate Nutritional Intake  Barriers to  Discharge Comments: CVA,pain, DM2, CKD IIIa, incontinence, constipation Possible Resolutions to [employer] Focus: monitor BP,follow CBG,encourage fluids, laxative for bowel funciton   Continued Need for Acute Rehabilitation Level of Care: The patient requires daily medical management by a physician with specialized training in physical medicine and rehabilitation for the following reasons: Direction of a multidisciplinary physical rehabilitation program to maximize functional independence : Yes Medical management of patient stability for increased activity during participation in an intensive rehabilitation regime.: Yes Analysis of laboratory values and/or radiology reports with any subsequent need for medication adjustment and/or medical intervention. : Yes   I attest that I was present, lead the team conference, and concur with the assessment and plan of the team.   Jearld Adjutant 07/24/23, 10:49 AM

## 2023-07-24 NOTE — Progress Notes (Signed)
Speech Language Pathology Daily Session Note  Patient Details  Name: Hunter Sparks MRN: [id number] Date of Birth: 1946/05/20  Today's Date: 07/24/2023 SLP Individual Time: 3329-5188 SLP Individual Time Calculation (min): 40 min  Short Term Goals: Week 2: SLP Short Term Goal 1 (Week 2): Patient will utilize external aids to orient to time with Min verbal cues. SLP Short Term Goal 2 (Week 2): Patient will demonstrate functional problem solving for basic and familiar tasks with Min verbal cues. SLP Short Term Goal 3 (Week 2): Patient will utilize memory compensatory strategies to recall information during structured tasks with 75% accuracy and Min verbal and visual cues. SLP Short Term Goal 4 (Week 2): Patient will demonstrate selective attention in a mildly distracting enviornment to a functional task for 15 minutes with supervision.  Skilled Therapeutic Interventions: SLP conducted skilled therapy session targeting cognitive retraining goals. SLP received patient laying in bed with wife present, reporting patient had poor night of sleep the previous night and was very drowsy. Despite claims, patient participated well throughout session. Patient required maxA to attend to basic therapy schedule tasks. Given max verbal and visual cues, patient identified therapists names, types, and times of previous sessions. Patient oriented to date with min-modA throughout session, requiring reorientation to visual aid frequently. Patient demonstrates significantly low carryover between sessions, requiring reeducation on all covered topics each session. Patient's attention waxes and wanes significantly with mood, fatigue, and pain, making tracking true therapy progress difficult. This session, wife attempted to increase wife's awareness of patient deficits, though ongoing education is indicated. Patient required max-totalA to answer basic verbal problem solving prompts with wife demonstrating surprise at patient  answers. Patient was left in lowered bed with call bell in reach and bed alarm set. SLP will continue to target goals per plan of care.      Pain Pain Assessment Pain Scale: Faces Faces Pain Scale: No hurt  Therapy/Group: Individual Therapy  Hunter Sparks, M.A., CCC-SLP  [name] 07/24/2023, 3:44 PM

## 2023-07-24 NOTE — Progress Notes (Signed)
Occupational Therapy Note  Patient Details  Name: Hunter Sparks MRN: [id number] Date of Birth: [birth year]  Today's Date: 07/24/2023 OT Missed Time: 60 Minutes Missed Time Reason: Patient fatigue  Patient greeted semi-reclined in bed with eyes closed. Pt reported he did not sleep any last night and did not feel well. OT gave multiple options for activities, but pt continued to decline. Pt missed 60 minutes of OT treatment session. Will follow up per plan of care.   [name] Graig Hessling 07/24/2023, 10:46 AM

## 2023-07-25 DIAGNOSIS — I639 Cerebral infarction, unspecified: Secondary | ICD-10-CM | POA: Diagnosis not present

## 2023-07-25 DIAGNOSIS — N1831 Chronic kidney disease, stage 3a: Secondary | ICD-10-CM | POA: Diagnosis not present

## 2023-07-25 DIAGNOSIS — I10 Essential (primary) hypertension: Secondary | ICD-10-CM | POA: Diagnosis not present

## 2023-07-25 DIAGNOSIS — E1122 Type 2 diabetes mellitus with diabetic chronic kidney disease: Secondary | ICD-10-CM | POA: Diagnosis not present

## 2023-07-25 LAB — GLUCOSE, CAPILLARY
Glucose-Capillary: 148 mg/dL — ABNORMAL HIGH (ref 70–99)
Glucose-Capillary: 159 mg/dL — ABNORMAL HIGH (ref 70–99)
Glucose-Capillary: 177 mg/dL — ABNORMAL HIGH (ref 70–99)
Glucose-Capillary: 86 mg/dL (ref 70–99)

## 2023-07-25 MED ORDER — ENSURE ENLIVE PO LIQD
237.0000 mL | Freq: Two times a day (BID) | ORAL | Status: DC
  Administered 2023-07-25 – 2023-08-02 (×5): 237 mL via ORAL

## 2023-07-25 NOTE — Progress Notes (Signed)
Physical Therapy Session Note  Patient Details  Name: Hunter Sparks MRN: [id number] Date of Birth: December 06, 1945  Today's Date: 07/25/2023 PT Individual Time: 1101-1127 PT Individual Time Calculation (min): 26 min   Short Term Goals: Week 1:  PT Short Term Goal 1 (Week 1): pt will maintain dynamic seated balance with mod A PT Short Term Goal 1 - Progress (Week 1): Met PT Short Term Goal 2 (Week 1): Pt will perform bed to chair transfer with LRAD and mod A PT Short Term Goal 2 - Progress (Week 1): Progressing toward goal PT Short Term Goal 3 (Week 1): pt will perform sit to stand with LRAD and mod A PT Short Term Goal 3 - Progress (Week 1): Met PT Short Term Goal 4 (Week 1): pt will ambulated 20 feet with LRAD and max A PT Short Term Goal 4 - Progress (Week 1): Met Week 2:  PT Short Term Goal 1 (Week 2): Pt will complete bed<>WC transfer with mod assist and LRAD PT Short Term Goal 2 (Week 2): Pt will ambulate with RW 11' with mod assist x1 PT Short Term Goal 3 (Week 2): Pt will complete WC mobility min assist 50' PT Short Term Goal 4 (Week 2): Pt will maintain standing balance with min assist x1 and LRAD  Skilled Therapeutic Interventions/Progress Updates:   Received pt semi-reclined in TIS [name] with wife at bedside. Pt agreeable to PT treatment reported low back pain but declined pain medication. Session with emphasis on functional mobility/transfers, standing balance, and gait training. Wife with questions regarding new placement of IV - secure chatted RN for clarification. Pt transported to/from room in [name] dependently for time management purposes. Stood with RW and CGA/light min A and ambulated 68ft with RW and min A for first few steps, fading to mod A with +2 providing intermittent assist for weight shifting at pelvis. Pt demonstrated narrow BOS with increased L lateral lean with increasing fatigue and decreased L foot clearance. Pt required max cues for weight shifting to R, increasing L  step length, and keeping RW within BOS. Returned to room and concluded session with pt sitting in TIS WC, needs within reach, and seatbelt alarm on.   Therapy Documentation Precautions:  Precautions Precautions: Fall Precaution Comments: left hemiparesis, pusher Restrictions Weight Bearing Restrictions: No Other Position/Activity Restrictions: Rt sided percutaneous drain  Therapy/Group: Individual Therapy Marlana Salvage Zaunegger Blima Rich PT, DPT 07/25/2023, 7:07 AM

## 2023-07-25 NOTE — Progress Notes (Signed)
Speech Language Pathology Daily Session Note  Patient Details  Name: Hunter Sparks MRN: [id number] Date of Birth: 07/10/1946  Today's Date: 07/25/2023 SLP Individual Time: 4098-1191 SLP Individual Time Calculation (min): 58 min  Short Term Goals: Week 2: SLP Short Term Goal 1 (Week 2): Patient will utilize external aids to orient to time with Min verbal cues. SLP Short Term Goal 2 (Week 2): Patient will demonstrate functional problem solving for basic and familiar tasks with Min verbal cues. SLP Short Term Goal 3 (Week 2): Patient will utilize memory compensatory strategies to recall information during structured tasks with 75% accuracy and Min verbal and visual cues. SLP Short Term Goal 4 (Week 2): Patient will demonstrate selective attention in a mildly distracting enviornment to a functional task for 15 minutes with supervision.  Skilled Therapeutic Interventions: SLP conducted skilled therapy session targeting cognitive retraining goals. Patient received upright in TIS chair reporting 10/10 leg pain. SLP alerted LPN to reported pain with LPN reporting he would come to administer pain medications. Despite claim and reattempt at contact, pain medications not administered for duration of 1-hour session with pain serving as significant distraction throughout. Patient participatory despite distraction of pain. SLP guided patient through recall of daily information utilizing daily schedule. SLP provided schedule modifications to assist with comprehension. With these modifications (visual cues added with pen), patient recalled previous therapy sessions with modA. Patient oriented intermittently to date utilizing external aid with minA-modA, requiring reorientation frequently throughout session. During basic math equation to determine remaining length of stay, patient benefited from presentation of prompt via written method and maxA. During selective attention task in mildly distracting environment,  patient benefited from minA to identify errors made during sorting task. Patient was left in chair with call bell in reach and chair alarm set. SLP will continue to target goals per plan of care.       Pain Pain Assessment Pain Scale: 0-10 Pain Score: 10-Worst pain ever Pain Location: Leg Pain Intervention(s): RN made aware  Therapy/Group: Individual Therapy  Jeannie Done, M.A., CCC-SLP  [name] 07/25/2023, 2:24 PM

## 2023-07-25 NOTE — Progress Notes (Signed)
Physical Therapy Session Note  Patient Details  Name: Hunter Sparks MRN: [id number] Date of Birth: Nov 20, 1945  Today's Date: 07/25/2023 PT Individual Time: 0905-1000 PT Individual Time Calculation (min): 55 min   Short Term Goals: Week 2:  PT Short Term Goal 1 (Week 2): Pt will complete bed<>WC transfer with mod assist and LRAD PT Short Term Goal 2 (Week 2): Pt will ambulate with RW 77' with mod assist x1 PT Short Term Goal 3 (Week 2): Pt will complete WC mobility min assist 50' PT Short Term Goal 4 (Week 2): Pt will maintain standing balance with min assist x1 and LRAD  Skilled Therapeutic Interventions/Progress Updates: Pt presented in bed requiring SIGNIFICANT noxious stimuli to arouse and maintain arousal. Once pt awake, pt agreeable to therapy. Pt c/o pain but does not indicate location nor rate pain. PTA threaded pants total A for time management while trying to arouse pt. Pt completed supine to sit with CGA, use of bed features and increased time. PTA donned shoes total A. Due to RW not located in room and pt unsafe to leave seated EOB, PTA used Stedy to transfer to w/c. In w/c pt moved to outside room and pt propelled to elevators for general conditioning. Pt transported remaining distance to main gym. Pt completed stand pivot transfer with RW and minA to high/low mat. Participated in Sit to stand from slightly elevated mat with minA fading to modA due to fatigue. With extended rest break pt worked on Investment banker, operational  21ft with RW and modA with w/c follow. PTA providing facilitation at hips to increase R weight shifting with verbal cues for increased L step length and increased BOS. Pt then transported to day room and completed Cybex Kinetron x3 min for reciprocal activity with max verbal cues for full range when depressing LLE. Pt then transported back to room and completed Stedy transfer to TIS for time management. Pt left in TIS at end of session with belt alarm on, call bell within reach  and current needs met.      Therapy Documentation Precautions:  Precautions Precautions: Fall Precaution Comments: left hemiparesis, pusher Restrictions Weight Bearing Restrictions: No Other Position/Activity Restrictions: Rt sided percutaneous drain General:   Vital Signs: Therapy Vitals Temp: 98.4 F (36.9 C) Temp Source: Oral Pulse Rate: 72 Resp: 18 BP: 112/62 Patient Position (if appropriate): Lying Oxygen Therapy SpO2: 100 % O2 Device: Room Air Pain: Pain Assessment Pain Scale: 0-10 Pain Score: 10-Worst pain ever Pain Location: Leg Pain Intervention(s): RN made aware Mobility:    Therapy/Group: Individual Therapy  Kevork Joyce 07/25/2023, 3:55 PM

## 2023-07-25 NOTE — Progress Notes (Signed)
PROGRESS NOTE   Subjective/Complaints:  No acute events noted overnight.  Patient working in the gym with therapy.  Patient does not like having IV fluids.  Reports he slept okay.  ROS: Limited due to cognitive/behavioral   Objective:   No results found. Recent Labs    07/23/23 0652  WBC 6.6  HGB 13.4  HCT 40.1  PLT 322    Recent Labs    07/23/23 0652  NA 138  K 3.9  CL 103  CO2 27  GLUCOSE 204*  BUN 26*  CREATININE 1.55*  CALCIUM 9.1     Intake/Output Summary (Last 24 hours) at 07/25/2023 1357 Last data filed at 07/25/2023 1200 Gross per 24 hour  Intake 223 ml  Output 25 ml  Net 198 ml        Physical Exam: Vital Signs Blood pressure 135/72, pulse 70, temperature 98 F (36.7 C), temperature source Oral, resp. rate 18, height 6' (1.829 m), weight 75 kg, SpO2 99%.  Constitutional: No distress . Vital signs reviewed.  Working with therapy in the gym HEENT: NCAT, EOMI, oral membranes moist Neck: supple Cardiovascular: RRR without murmur. No JVD    Respiratory/Chest: CTA Bilaterally without wheezes or rales. Normal effort    GI/Abdomen: BS +, non-tender, non-distended Ext: no clubbing, cyanosis, or edema Psych: flat, slow to process, appears frustrated Skin: C/D/I. No apparent lesions. Drain site intact MSK:      No joint swelling or tenderness noted Neurologic exam:  Cognition: Awake and alert.  Oriented to person, place, Poor insight. Can perseverate. Can be distracted by environment Sensation: To light touch intact in BL UEs and LEs  CN: 2-12 grossly intact.  Coordination:  Bilateral upper extremity ataxia, left greater than right.  Strength: 5 out of 5 strength throughout bilateral upper extremities 5 out of 5 strength in right lower extremity 4+ out of 5 strength throughout left lower extremity--no change   Assessment/Plan: 1. Functional deficits which require 3+ hours per day of  interdisciplinary therapy in a comprehensive inpatient rehab setting. Physiatrist is providing close team supervision and 24 hour management of active medical problems listed below. Physiatrist and rehab team continue to assess barriers to discharge/monitor patient progress toward functional and medical goals  Care Tool:  Bathing  Bathing activity did not occur: Refused Body parts bathed by patient: Face   Body parts bathed by helper: Right arm, Left arm, Chest, Abdomen, Front perineal area, Buttocks, Right upper leg, Left upper leg, Left lower leg     Bathing assist Assist Level: Total Assistance - Patient < 25%     Upper Body Dressing/Undressing Upper body dressing   What is the patient wearing?: Button up shirt    Upper body assist Assist Level: Moderate Assistance - Patient 50 - 74%    Lower Body Dressing/Undressing Lower body dressing      What is the patient wearing?: Pants, Underwear/pull up     Lower body assist Assist for lower body dressing: Maximal Assistance - Patient 25 - 49%     Toileting Toileting Toileting Activity did not occur (Clothing management and hygiene only): N/A (no void or bm)  Toileting assist Assist for toileting: Moderate  Assistance - Patient 50 - 74%     Transfers Chair/bed transfer  Transfers assist     Chair/bed transfer assist level: Moderate Assistance - Patient 50 - 74% (via stedy)     Locomotion Ambulation   Ambulation assist      Assist level: 2 helpers Assistive device: Walker-rolling Max distance: 74ft   Walk 10 feet activity   Assist  Walk 10 feet activity did not occur: Safety/medical concerns  Assist level: 2 helpers Assistive device: Walker-rolling   Walk 50 feet activity   Assist Walk 50 feet with 2 turns activity did not occur: Safety/medical concerns  Assist level: 2 helpers Assistive device: Walker-rolling    Walk 150 feet activity   Assist Walk 150 feet activity did not occur:  Safety/medical concerns         Walk 10 feet on uneven surface  activity   Assist Walk 10 feet on uneven surfaces activity did not occur: Safety/medical concerns         Wheelchair     Assist Is the patient using a wheelchair?: Yes Type of Wheelchair: Manual    Wheelchair assist level: Moderate Assistance - Patient 50 - 74% Max wheelchair distance: 24'    Wheelchair 50 feet with 2 turns activity    Assist        Assist Level: Moderate Assistance - Patient 50 - 74%   Wheelchair 150 feet activity     Assist      Assist Level: Total Assistance - Patient < 25%   Blood pressure 135/72, pulse 70, temperature 98 F (36.7 C), temperature source Oral, resp. rate 18, height 6' (1.829 m), weight 75 kg, SpO2 99%.  1. 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  Antithrombotics: -DVT/anticoagulation:  Pharmaceutical: Heparin 5000U q8h -antiplatelet therapy: Aspirin and Plavix for three weeks followed by aspirin alone>>last dose of Plavix 8/30.    3. Pain Management: chronic back pain -Tylenol and Robaxin as needed, Voltaren QID, lidoderm daily             -continue tramadol 50 mg TID (ordered as q6h PRN currently) -Patient reporting chronic pain in multiple areas of his body, continue low-dose gabapentin 100 mg 3 times daily--may need to consider backing off he remains lethargic -9/10 lower back pain today.  Will schedule Tylenol, do not know if he remembers to ask for it. -9/11 pain appears to be improved, continue scheduled Tylenol   4. Mood/Behavior/Sleep/delirium: LCSW to evaluate and provide emotional support             -continue trazodone 50mg  q HS              -antipsychotic agents: reduce Zyprexa to 2.5 mg daily (already got 9/9 dose) -9/9 no new scheduled meds at bedtime currently   -will change timing of trazodone to 2100 5. Neuropsych/cognition: This patient is not quite capable of making decisions on his own behalf.   6. Skin/Wound Care: Routine skin care checks   7. Fluids/Electrolytes/Nutrition: Routine Is and Os and follow-up chemistries             -dys 3 with thin  liquids             -SLP eval, cont vitamins   I personally reviewed the patient's labs today.   8: Hypertension: monitor TID and prn (home Cozaar 25 mg daily held)             -continue metoprolol 50 mg BID   -9/10-11 BP controlled, continue current regimen Vitals:   07/21/23 1932 07/22/23 0407 07/22/23 1732 07/22/23 1908  BP: 116/69 120/64 (!) 127/59 134/76   07/23/23 0255 07/23/23 0829 07/23/23 1503 07/23/23 1936  BP: (!) 141/70 107/60 125/69 136/75   07/24/23 0500 07/24/23 1538 07/24/23 1927 07/25/23 0429  BP: (!) 148/80 136/71 137/66 135/72     9: Hyperlipidemia: statin held (follow-up LFTs and restart atorvastatin 40 mg as appropriate)   10: Hypothyroidism: continue Synthroid daily   11: Acute cholecystitis s/p cholecystostomy tube             -routine drain care. Site is fine             -continue Levaquin 250 mg daily   12: DM-2: CBGs QID; A1c = 8.8%; on insulin at home             -continue SSI  -9/6 CBGs little higher today, continue to monitor trend for now  -07/22/23 CBGs a little worse today, looks like he was on toujeo 54U nightly before admission and is now eating better; will restart semglee at 5U nightly to start-- verified with pharmacy that this would be an ok start.   -9/11 CBGs improved today, continue current regimen  CBG (last 3)  Recent Labs    07/24/23 2107 07/25/23 0624 07/25/23 1132  GLUCAP 121* 148* 159*      13: Hx of CAD s/p CABG 2016: on Plavix, asa, BB; ? resume statin   14: CKD IIIa: baseline Cr ~1.3              -follow-up BMP  9/3--Cr 1.54, near baseline---no changes  -encourage appropriate fluids  9/9 BUN/ CR trending up. ---push fluids  -recheck bmet tomorrow  -might need IVF 9/10 creatinine 1.55 although BUN up to 26.  Will add some IVF normal saline at night 9/11 recheck BMP tomorrow  15: GI prophylaxis: continue Protonix 40mg  daily   16.  Bowel and bladder incontinence.    PVRs. -last Bms noted to be smears on 9/2 and 9/1, schedule miralax for constipation (see below) -07/21/23 PVRs not well documented, but seems to have intermittent incontinence; monitor -07/22/23 only one PVR documented; still intermittent incontinence; defer to weekday team on this topic.   17. R knee pain  -Start voltaren gel, consider xray if not improved by tomorrow  18. Malnutrition, severe  -9/5 RD following, appreciate assistance, continue to encourage PO intake   19. Constipation  -9/5 continue miralax increase to BID, Check ABD xray  -9/6 moderate stool burden, will order sorbitol x1 -9/9 still no bm since 9/5 (smears)  -sorbitol 60cc today  -SSE if needed this afternoon -9/10 last bowel movement yesterday  LOS: 11 days A FACE TO FACE EVALUATION WAS PERFORMED  Fanny Dance 07/25/2023, 1:57 PM

## 2023-07-25 NOTE — Progress Notes (Signed)
Occupational Therapy Session Note  Patient Details  Name: Hunter Sparks MRN: [id number] Date of Birth: 1946/06/25  Today's Date: 07/25/2023 OT Individual Time: 1415-1456 OT Individual Time Calculation (min): 41 min    Short Term Goals: Week 2:  OT Short Term Goal 1 (Week 2): Patient will stand and free one hand at a time to help manage pants up or down during toileting OT Short Term Goal 2 (Week 2): Patient will identify need to void, and trasnfer to commode with min assist OT Short Term Goal 3 (Week 2): Pt will maintain dynamic standing balance with min A for 2 minutes in preparation for BADL tasks  Skilled Therapeutic Interventions/Progress Updates:    Pt received sitting in the TIS w/c with his wife present. He reports "a little bit" of pain in his bottom from sitting and is requesting to go back to bed at end of session. Assisted in calling his wife a volunteer w/c ride to front of hospital. Pt was taken via w/c to the therapy gym for time management. Pt completed BUE dowel  therex with 3lb. Completed to challenge BUE strength, bimanual integration, and endurance required for maximal independence with ADLs and ADL transfers. Pt completed forward and overhead reaching. Cueing required to ensure proper form and technique for proper muscle activation. 3x10 repetitions. Pt returned to his room and completed a stand pivot transfer back to bed with min A. With bed in trend position he was able to pull himself up with (S). He was left supine with all needs met, bed alarm set.    Therapy Documentation Precautions:  Precautions Precautions: Fall Precaution Comments: left hemiparesis, pusher Restrictions Weight Bearing Restrictions: No Other Position/Activity Restrictions: Rt sided percutaneous drain  Therapy/Group: Individual Therapy  Crissie Reese 07/25/2023, 6:38 AM

## 2023-07-26 DIAGNOSIS — E43 Unspecified severe protein-calorie malnutrition: Secondary | ICD-10-CM

## 2023-07-26 LAB — BASIC METABOLIC PANEL
Anion gap: 8 (ref 5–15)
BUN: 18 mg/dL (ref 8–23)
CO2: 24 mmol/L (ref 22–32)
Calcium: 8.8 mg/dL — ABNORMAL LOW (ref 8.9–10.3)
Chloride: 106 mmol/L (ref 98–111)
Creatinine, Ser: 1.2 mg/dL (ref 0.61–1.24)
GFR, Estimated: >60 mL/min (ref >60)
Glucose, Bld: 139 mg/dL — ABNORMAL HIGH (ref 70–99)
Potassium: 3.9 mmol/L (ref 3.5–5.1)
Sodium: 138 mmol/L (ref 135–145)

## 2023-07-26 LAB — GLUCOSE, CAPILLARY
Glucose-Capillary: 136 mg/dL — ABNORMAL HIGH (ref 70–99)
Glucose-Capillary: 189 mg/dL — ABNORMAL HIGH (ref 70–99)
Glucose-Capillary: 160 mg/dL — ABNORMAL HIGH (ref 70–99)
Glucose-Capillary: 170 mg/dL — ABNORMAL HIGH (ref 70–99)

## 2023-07-26 NOTE — Progress Notes (Signed)
PROGRESS NOTE   Subjective/Complaints:  Patient's [birth year] is [birth year].Continues to have suboptimal p.o. intake.  He reports he does not like the food here and eats much better when family brings him food.  He is not interested in appetite stimulant, family plans to bring more food that he likes to help his appetite.  ROS: Limited due to cognitive/behavioral   Objective:   No results found. No results for input(s): "WBC", "HGB", "HCT", "PLT" in the last 72 hours.   Recent Labs    07/26/23 0743  NA 138  K 3.9  CL 106  CO2 24  GLUCOSE 139*  BUN 18  CREATININE 1.20  CALCIUM 8.8*     Intake/Output Summary (Last 24 hours) at 07/26/2023 1656 Last data filed at 07/26/2023 1500 Gross per 24 hour  Intake 128 ml  Output 460 ml  Net -332 ml        Physical Exam: Vital Signs Blood pressure 108/62, pulse (!) 58, temperature 97.9 F (36.6 C), temperature source Oral, resp. rate 16, height 6' (1.829 m), weight 75 kg, SpO2 98%.  Constitutional: No distress . Vital signs reviewed.  Working with therapy in the gym HEENT: NCAT, EOMI, oral membranes moist Neck: supple Cardiovascular: RRR without murmur. No JVD    Respiratory/Chest: CTA Bilaterally without wheezes or rales. Normal effort    GI/Abdomen: BS +, non-tender, non-distended Ext: no clubbing, cyanosis, or edema Psych: flat, slow to process, appears frustrated Skin: C/D/I. No apparent lesions. Drain site intact MSK:      No joint swelling or tenderness noted Neurologic exam:  Cognition: Awake and alert.  Oriented to person, place, Poor insight. Can perseverate. Can be distracted by environment Sensation: To light touch intact in BL UEs and LEs  CN: 2-12 grossly intact.  Coordination:  Bilateral upper extremity ataxia, left greater than right.  Strength: 5 out of 5 strength throughout bilateral upper extremities 5 out of 5 strength in right lower extremity 4+ out of  5 strength throughout left lower extremity--no change   Assessment/Plan: 1. Functional deficits which require 3+ hours per day of interdisciplinary therapy in a comprehensive inpatient rehab setting. Physiatrist is providing close team supervision and 24 hour management of active medical problems listed below. Physiatrist and rehab team continue to assess barriers to discharge/monitor patient progress toward functional and medical goals  Care Tool:  Bathing  Bathing activity did not occur: Refused Body parts bathed by patient: Right arm, Chest, Left arm, Abdomen, Front perineal area, Buttocks, Right upper leg, Left upper leg, Face   Body parts bathed by helper: Left lower leg, Right lower leg     Bathing assist Assist Level: Minimal Assistance - Patient > 75%     Upper Body Dressing/Undressing Upper body dressing   What is the patient wearing?: Pull over shirt    Upper body assist Assist Level: Minimal Assistance - Patient > 75%    Lower Body Dressing/Undressing Lower body dressing      What is the patient wearing?: Pants     Lower body assist Assist for lower body dressing: Moderate Assistance - Patient 50 - 74%     Toileting Toileting Toileting Activity did not occur (  Clothing management and hygiene only): N/A (no void or bm)  Toileting assist Assist for toileting: Minimal Assistance - Patient > 75%     Transfers Chair/bed transfer  Transfers assist     Chair/bed transfer assist level: Minimal Assistance - Patient > 75%     Locomotion Ambulation   Ambulation assist      Assist level: 2 helpers Assistive device: Walker-rolling Max distance: 10ft   Walk 10 feet activity   Assist  Walk 10 feet activity did not occur: Safety/medical concerns  Assist level: 2 helpers Assistive device: Walker-rolling   Walk 50 feet activity   Assist Walk 50 feet with 2 turns activity did not occur: Safety/medical concerns  Assist level: 2 helpers Assistive  device: Walker-rolling    Walk 150 feet activity   Assist Walk 150 feet activity did not occur: Safety/medical concerns         Walk 10 feet on uneven surface  activity   Assist Walk 10 feet on uneven surfaces activity did not occur: Safety/medical concerns         Wheelchair     Assist Is the patient using a wheelchair?: Yes Type of Wheelchair: Manual    Wheelchair assist level: Moderate Assistance - Patient 50 - 74% Max wheelchair distance: 38'    Wheelchair 50 feet with 2 turns activity    Assist        Assist Level: Moderate Assistance - Patient 50 - 74%   Wheelchair 150 feet activity     Assist      Assist Level: Total Assistance - Patient < 25%   Blood pressure 108/62, pulse (!) 58, temperature 97.9 F (36.6 C), temperature source Oral, resp. rate 16, height 6' (1.829 m), weight 75 kg, SpO2 98%.  1. 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  Antithrombotics: -DVT/anticoagulation:  Pharmaceutical: Heparin 5000U q8h -antiplatelet therapy: Aspirin and Plavix for three weeks followed by aspirin alone>>last dose of Plavix 8/30.    3. Pain Management: chronic back pain -Tylenol and Robaxin as needed, Voltaren QID, lidoderm daily             -continue tramadol 50 mg TID (ordered as q6h PRN currently) -Patient reporting chronic pain in multiple areas of his body, continue low-dose gabapentin 100 mg 3 times daily--may need to consider backing off he remains lethargic -9/10 lower back pain today.  Will schedule Tylenol, do not know if he remembers to ask for it. -9/12 infrequent use of tramadol, continue scheduled Tylenol and monitor   4.  Mood/Behavior/Sleep/delirium: LCSW to evaluate and provide emotional support             -continue trazodone 50mg  q HS             -antipsychotic agents: reduce Zyprexa to 2.5 mg daily (already got 9/9 dose) -9/9 no new scheduled meds at bedtime currently   -will change timing of trazodone to 2100 5. Neuropsych/cognition: This patient is not quite capable of making decisions on his own behalf.   6. Skin/Wound Care: Routine skin care checks   7. Fluids/Electrolytes/Nutrition: Routine Is and Os and follow-up chemistries             -  dys 3 with thin liquids             -SLP eval, cont vitamins   I personally reviewed the patient's labs today.    8: Hypertension: monitor TID and prn (home Cozaar 25 mg daily held)             -continue metoprolol 50 mg BID   -9/12 patient had 2 blood pressure readings that were elevated yesterday however overall has been controlled, continue to monitor trend Vitals:   07/23/23 0255 07/23/23 0829 07/23/23 1503 07/23/23 1936  BP: (!) 141/70 107/60 125/69 136/75   07/24/23 0500 07/24/23 1538 07/24/23 1927 07/25/23 0429  BP: (!) 148/80 136/71 137/66 135/72   07/25/23 1526 07/25/23 2214 07/26/23 0337 07/26/23 1344  BP: 112/62 (!) 162/86 (!) 162/75 108/62     9: Hyperlipidemia: statin held (follow-up LFTs and restart atorvastatin 40 mg as appropriate)   10: Hypothyroidism: continue Synthroid daily   11: Acute cholecystitis s/p cholecystostomy tube             -routine drain care. Site is fine             -continue Levaquin 250 mg daily   12: DM-2: CBGs QID; A1c = 8.8%; on insulin at home             -continue SSI  -9/6 CBGs little higher today, continue to monitor trend for now  -07/22/23 CBGs a little worse today, looks like he was on toujeo 54U nightly before admission and is now eating better; will restart semglee at 5U nightly to start-- verified with pharmacy that this would be an ok start.   -9/12 CBGs controlled overall, continue to  monitor  CBG (last 3)  Recent Labs    07/25/23 2101 07/26/23 0619 07/26/23 1156  GLUCAP 86 136* 189*      13: Hx of CAD s/p CABG 2016: on Plavix, asa, BB; ? resume statin   14: CKD IIIa: baseline Cr ~1.3             -follow-up BMP  9/3--Cr 1.54, near baseline---no changes  -encourage appropriate fluids  9/9 BUN/ CR trending up. ---push fluids  -recheck bmet tomorrow  -might need IVF 9/10 creatinine 1.55 although BUN up to 26.  Will add some IVF normal saline at night 9/12 creatinine and BUN improved today to 1.2/18  15: GI prophylaxis: continue Protonix 40mg  daily   16.  Bowel and bladder incontinence.    PVRs. -last Bms noted to be smears on 9/2 and 9/1, schedule miralax for constipation (see below) -07/21/23 PVRs not well documented, but seems to have intermittent incontinence; monitor -07/22/23 only one PVR documented; still intermittent incontinence; defer to weekday team on this topic.   17. R knee pain  -Start voltaren gel, consider xray if not improved by tomorrow  18. Malnutrition, severe  -9/5 RD following, appreciate assistance, continue to encourage PO intake   -Family plans to bring more outside food to help stimulate his appetite  19. Constipation  -9/5 continue miralax increase to BID, Check ABD xray  -9/6 moderate stool burden, will order sorbitol x1 -9/9 still no bm since 9/5 (smears)  -sorbitol 60cc today  -SSE if needed this afternoon -9/12 patient has been declining MiraLAX, continue to monitor bowel function    LOS: 12 days A FACE TO FACE EVALUATION WAS PERFORMED  Fanny Dance 07/26/2023, 4:56 PM

## 2023-07-26 NOTE — Progress Notes (Signed)
Biliary drain teaching completed with pts wife at this time. This included flushing,emptying/measuring drainage bag, and dressing change. Wife voices understanding. Will reinforce upon next scheduled flush.

## 2023-07-26 NOTE — Progress Notes (Signed)
Speech Language Pathology Daily Session Note  Patient Details  Name: Hunter Sparks MRN: [id number] Date of Birth: 07-Jul-1946  Today's Date: 07/26/2023 SLP Individual Time: 0900-0930 SLP Individual Time Calculation (min): 30 min  Short Term Goals: Week 2: SLP Short Term Goal 1 (Week 2): Patient will utilize external aids to orient to time with Min verbal cues. SLP Short Term Goal 2 (Week 2): Patient will demonstrate functional problem solving for basic and familiar tasks with Min verbal cues. SLP Short Term Goal 3 (Week 2): Patient will utilize memory compensatory strategies to recall information during structured tasks with 75% accuracy and Min verbal and visual cues. SLP Short Term Goal 4 (Week 2): Patient will demonstrate selective attention in a mildly distracting enviornment to a functional task for 15 minutes with supervision.  Skilled Therapeutic Interventions: SLP conducted skilled therapy session targeting family education and cognitive retraining. Wife present for family education and receptive to presented information. This date, patient much more alert than previous despite reports of limited sleep overnight. SLP discussed current memory, attention, awareness, and orientation deficits as well as compensatory strategies and demonstrations of beneficial cues. Wife verbalized understanding, though patient insight still exceptionally limited. Patient required modA to orient to day and maxA to continuously recall discharge date. Patient convinced he is leaving this date despite multiple episodes of reeducation and placement of 2 visual aids stating discharge date. Patient was left in lowered bed with call bell in reach and bed alarm set. SLP will continue to target goals per plan of care.      Pain Pain Assessment Pain Scale: 0-10 Pain Score: 0-No pain Faces Pain Scale: Hurts little more Pain Location: Head  Therapy/Group: Individual Therapy  Jeannie Done, M.A., CCC-SLP  [name] 07/26/2023, 12:10 PM

## 2023-07-26 NOTE — Progress Notes (Signed)
Physical Therapy Session Note  Patient Details  Name: Hunter Sparks MRN: [id number] Date of Birth: Aug 18, 1946  Today's Date: 07/26/2023 PT Individual Time: 1100-1155 PT Individual Time Calculation (min): 55 min   Short Term Goals: Week 2:  PT Short Term Goal 1 (Week 2): Pt will complete bed<>WC transfer with mod assist and LRAD PT Short Term Goal 2 (Week 2): Pt will ambulate with RW 57' with mod assist x1 PT Short Term Goal 3 (Week 2): Pt will complete WC mobility min assist 50' PT Short Term Goal 4 (Week 2): Pt will maintain standing balance with min assist x1 and LRAD  Skilled Therapeutic Interventions/Progress Updates: Pt presented in w/c with wife present agreeable to therapy. Pt denies pain currently (premedicated). Discussed with wife pt's CLOF and anticipated LOF. Per wife ramp is being installed and will have elevated toilet seat. Advised that currently pt is not able to ambulate independently. Also discussed car transfer for d/c with wife stating either truck or her SUV. Recommended SUV due to truck height with wife verbalizing understanding. Pt transported to ortho gym for energy conservation and car simulator adjusted to SUV height. Demonstrated to wife technique for car transfer with pt able to stand with minA and complete transfer to car with minA. Pt required minA for LLE management into simulator but was able to manage BLE out of simulator without assist. Pt then ambulated from simulator to mat across room ~39ft with RW and minA. Pt required multimodal cues for increased BOS, increased L step length, weight shift to R, and staying within RW. Discussed with wife that these are deficits that will continue to be present upon d/c and will be more prevalent with fatigue. PTA also discussed safe hand placement with Sit to stand with pt performing x 5 with minA fading to CGA. PTA obtain shuffleboard stick with pt ambulating ~28 across room with pt required to step over stick as "threshold"  pt required increased time and minA for RW but with verbal cues was able to clearly step over stick with LLE. Pt then worked on endurance and propelled w/c from ortho gym to elevators with pt requiring x 3 rest breaks. Discussed with primary OT and wife possible need for w/c upon d/c due to level of assist and limited endurance required for ambulation. Pt was transported remaining distance back to room and pt remained in w/c at end of session with belt alarm on, call bell within reach and needs met.      Therapy Documentation Precautions:  Precautions Precautions: Fall Precaution Comments: left hemiparesis, pusher Restrictions Weight Bearing Restrictions: No Other Position/Activity Restrictions: Rt sided percutaneous drain General:   Vital Signs:   Pain: Pain Assessment Pain Scale: 0-10 Pain Score: 0-No pain Faces Pain Scale: Hurts little more Pain Location: Head   Therapy/Group: Individual Therapy  Eveline Sauve 07/26/2023, 12:18 PM

## 2023-07-26 NOTE — Progress Notes (Addendum)
Occupational Therapy Session Note  Patient Details  Name: Hunter Sparks MRN: [id number] Date of Birth: [birth year]  Today's Date: 07/26/2023 Session 1 OT Individual Time: 1005-1100 OT Individual Time Calculation (min): 55 min   Session 2 OT Individual Time: 7829-5621 OT Individual Time Calculation (min): 59 min   Short Term Goals: Week 2:  OT Short Term Goal 1 (Week 2): Patient will stand and free one hand at a time to help manage pants up or down during toileting OT Short Term Goal 2 (Week 2): Patient will identify need to void, and trasnfer to commode with min assist OT Short Term Goal 3 (Week 2): Pt will maintain dynamic standing balance with min A for 2 minutes in preparation for BADL tasks  Skilled Therapeutic Interventions/Progress Updates:  Session 1   Pt greeted semi-reclined in bed with spouse present to observe session today. Pt completed bed mobility with mod A to elevate trunk into sitting. Verbal cues for hand placement on RW for sit<>stand, but able to complete with min A. Pt then pivoted to wc with RW and min A with cues to take larger steps with L LE. Pt brought to the sink in wc for bathing/dressing tasks. Overall min to mod A for sit<>stands at the sink with min A for standing balance when removing unilateral UE support to wash peri-area and buttocks. Pt able to achieve figure 4 position to assist with washing lower legs, donning socks, and threading pants. Increased time, effort, and encouragement needed for all BADL tasks. Pt then agreeable to wash hair. Pt maintained grasp on hair washing tray while OT assisted with washing task. Pt fired hair with towel and able to comb. Pt left seated in wc at end of session with spouse present awaiting next therapy session.  Pain:  Pt reports chronic back pain, no number given, rest and repositioned for comfort.   Session 2 Pt greeted seated in wc and agreeable to OT treatment session. Pt brought to therapy gym in wc and completed L  UE fine motor activity using small pegs. Pt needed min cues to keep using his L hand as he wanted to switch to his dominant R hand. OT obtained a cushion for regular wc to improve comfort and sitting tolerance. Pt stood at therapy table with min A for cushion placement. Pt participated in wc level dance activity with focus on cardiovascular endurance and movement of all 4 extremities. Pt returned to room and completed stand-pivot back to bed with min A. Pt left semi-reclined in bed with bed alarm on, call bell in reach, and needs met.  Therapy Documentation Precautions:  Precautions Precautions: Fall Precaution Comments: left hemiparesis, pusher Restrictions Weight Bearing Restrictions: No Other Position/Activity Restrictions: Rt sided percutaneous drain Pain: Pain Assessment Faces Pain Scale: Hurts little more Pain Location: HeadPt reports chronic back pain, no number given, rest and repositioned for comfort.    Therapy/Group: Individual Therapy  [name] 07/26/2023, 3:07 PM

## 2023-07-27 DIAGNOSIS — R32 Unspecified urinary incontinence: Secondary | ICD-10-CM

## 2023-07-27 LAB — GLUCOSE, CAPILLARY
Glucose-Capillary: 120 mg/dL — ABNORMAL HIGH (ref 70–99)
Glucose-Capillary: 205 mg/dL — ABNORMAL HIGH (ref 70–99)
Glucose-Capillary: 147 mg/dL — ABNORMAL HIGH (ref 70–99)
Glucose-Capillary: 125 mg/dL — ABNORMAL HIGH (ref 70–99)

## 2023-07-27 MED ORDER — SORBITOL 70 % SOLN
45.0000 mL | Freq: Once | Status: AC
  Administered 2023-07-27: 45 mL via ORAL
  Filled 2023-07-27: qty 60

## 2023-07-27 NOTE — Progress Notes (Signed)
Recreational Therapy Session Note  Patient Details  Name: Hunter Sparks MRN: [id number] Date of Birth: [birth year] Today's Date: 07/27/2023  Pain: c/o back pain, unrated, stating it hurts bad, premedicated- RN made aware and meds provided Skilled Therapeutic Interventions/Progress Updates: Pt with c/o pain and not feeling well as a result.  After discussion, pt agreeable to try a stronger pain medicine than the tylenol he had taken earlier.  RN administered.  Repositioned pt in TIS and assisted pt in calling his wife.  Eval deferred at this time due to pain.   [name] 07/27/2023, 12:26 PM

## 2023-07-27 NOTE — Progress Notes (Signed)
Patient ID: Hunter Sparks, male   DOB: Apr 11, 1946, 77 y.o.   MRN: [id number]  SW waiting on follow-up from [facility] about referral for HHPT/OT/SLP/SN/Aide.  Cecile Sheerer, MSW, LCSWA Office: [phone] Cell: [phone] Fax: [phone]

## 2023-07-27 NOTE — Progress Notes (Addendum)
PROGRESS NOTE   Subjective/Complaints:  Patient is reporting he is having back pain this morning.  He got tramadol after we talked.Marland Kitchen  He is anxious about getting home.  He reports he is eating food brought by family better than hospital food, he does not like hospital food.  ROS: Limited due to cognitive/behavioral   Objective:   No results found. No results for input(s): "WBC", "HGB", "HCT", "PLT" in the last 72 hours.   Recent Labs    07/26/23 0743  NA 138  K 3.9  CL 106  CO2 24  GLUCOSE 139*  BUN 18  CREATININE 1.20  CALCIUM 8.8*     Intake/Output Summary (Last 24 hours) at 07/27/2023 1318 Last data filed at 07/27/2023 0700 Gross per 24 hour  Intake 245 ml  Output 310 ml  Net -65 ml        Physical Exam: Vital Signs Blood pressure 109/63, pulse 96, temperature (!) 97.5 F (36.4 C), resp. rate 15, height 6' (1.829 m), weight 75 kg, SpO2 94%.  Constitutional: No distress . Vital signs reviewed.  Lying in the bed HEENT: NCAT, EOMI, oral membranes moist Neck: supple Cardiovascular: RRR without murmur. No JVD    Respiratory/Chest: CTA Bilaterally without wheezes or rales. Normal effort    GI/Abdomen: BS +, non-tender, non-distended Ext: no clubbing, cyanosis, or edema Psych: flat, slow to process, appears frustrated Skin: C/D/I. No apparent lesions. Drain site intact MSK:      No joint swelling or tenderness noted Neurologic exam:  Cognition: Awake and alert.  Oriented to person, place, Poor insight.  Can perseverate, easily distractible Sensation: To light touch intact in BL UEs and LEs  CN: 2-12 grossly intact.  Coordination:  Bilateral upper extremity ataxia, left greater than right.  Strength: 5 out of 5 strength throughout bilateral upper extremities 5 out of 5 strength in right lower extremity 4+ out of 5 strength throughout left lower extremity--no change   Assessment/Plan: 1. Functional  deficits which require 3+ hours per day of interdisciplinary therapy in a comprehensive inpatient rehab setting. Physiatrist is providing close team supervision and 24 hour management of active medical problems listed below. Physiatrist and rehab team continue to assess barriers to discharge/monitor patient progress toward functional and medical goals  Care Tool:  Bathing  Bathing activity did not occur: Refused Body parts bathed by patient: Right arm, Chest, Left arm, Abdomen, Front perineal area, Buttocks, Right upper leg, Left upper leg, Face   Body parts bathed by helper: Left lower leg, Right lower leg     Bathing assist Assist Level: Minimal Assistance - Patient > 75%     Upper Body Dressing/Undressing Upper body dressing   What is the patient wearing?: Pull over shirt    Upper body assist Assist Level: Minimal Assistance - Patient > 75%    Lower Body Dressing/Undressing Lower body dressing      What is the patient wearing?: Pants     Lower body assist Assist for lower body dressing: Moderate Assistance - Patient 50 - 74%     Toileting Toileting Toileting Activity did not occur (Clothing management and hygiene only): N/A (no void or bm)  Toileting  assist Assist for toileting: Minimal Assistance - Patient > 75%     Transfers Chair/bed transfer  Transfers assist     Chair/bed transfer assist level: Minimal Assistance - Patient > 75%     Locomotion Ambulation   Ambulation assist      Assist level: 2 helpers Assistive device: Walker-rolling Max distance: 18ft   Walk 10 feet activity   Assist  Walk 10 feet activity did not occur: Safety/medical concerns  Assist level: 2 helpers Assistive device: Walker-rolling   Walk 50 feet activity   Assist Walk 50 feet with 2 turns activity did not occur: Safety/medical concerns  Assist level: 2 helpers Assistive device: Walker-rolling    Walk 150 feet activity   Assist Walk 150 feet activity did not  occur: Safety/medical concerns         Walk 10 feet on uneven surface  activity   Assist Walk 10 feet on uneven surfaces activity did not occur: Safety/medical concerns         Wheelchair     Assist Is the patient using a wheelchair?: Yes Type of Wheelchair: Manual    Wheelchair assist level: Moderate Assistance - Patient 50 - 74% Max wheelchair distance: 63'    Wheelchair 50 feet with 2 turns activity    Assist        Assist Level: Moderate Assistance - Patient 50 - 74%   Wheelchair 150 feet activity     Assist      Assist Level: Total Assistance - Patient < 25%   Blood pressure 109/63, pulse 96, temperature (!) 97.5 F (36.4 C), resp. rate 15, height 6' (1.829 m), weight 75 kg, SpO2 94%.  1. 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  Antithrombotics: -DVT/anticoagulation:  Pharmaceutical: Heparin 5000U q8h -antiplatelet therapy: Aspirin and Plavix for three weeks followed by aspirin alone>>last dose of Plavix 8/30.    3. Pain Management: chronic back pain -Tylenol and Robaxin as needed, Voltaren QID, lidoderm daily             -continue tramadol 50 mg TID (ordered as q6h PRN currently) -Patient reporting chronic pain in multiple areas of his body, continue low-dose gabapentin 100 mg 3 times daily--may need to consider backing off he remains lethargic -9/10 lower back pain today.  Will schedule Tylenol, do not know if he remembers to ask for it. -9/13 continue scheduled Tylenol and current as needed medications   4. Mood/Behavior/Sleep/delirium: LCSW to evaluate and provide emotional support             -continue trazodone 50mg  q HS              -antipsychotic agents: reduce Zyprexa to 2.5 mg daily (already got 9/9 dose) -9/9 no new scheduled meds at bedtime currently   -will change timing of trazodone to 2100 5. Neuropsych/cognition: This patient is not quite capable of making decisions on his own behalf.   6. Skin/Wound Care: Routine skin care checks   7. Fluids/Electrolytes/Nutrition: Routine Is and Os and follow-up chemistries             -dys 3 with thin liquids             -  SLP eval, cont vitamins   I personally reviewed the patient's labs today.    8: Hypertension: monitor TID and prn (home Cozaar 25 mg daily held)             -continue metoprolol 50 mg BID   -9/12 patient had 2 blood pressure readings that were elevated yesterday however overall has been controlled, continue to monitor trend Vitals:   07/23/23 1936 07/24/23 0500 07/24/23 1538 07/24/23 1927  BP: 136/75 (!) 148/80 136/71 137/66   07/25/23 0429 07/25/23 1526 07/25/23 2214 07/26/23 0337  BP: 135/72 112/62 (!) 162/86 (!) 162/75   07/26/23 1344 07/26/23 1913 07/26/23 2228 07/27/23 0345  BP: 108/62 (!) 129/57 102/76 109/63     9: Hyperlipidemia: statin held (follow-up LFTs and restart atorvastatin 40 mg as appropriate)   10: Hypothyroidism: continue Synthroid daily   11: Acute cholecystitis s/p cholecystostomy tube             -routine drain care. Site is fine             -continue Levaquin 250 mg daily  -9/13 patient asked about timing of possible cholecystectomy surgery.  Advised he will need to discuss this outpatient with general surgery follow-up   12: DM-2: CBGs QID; A1c = 8.8%; on insulin at home             -continue SSI  -9/6 CBGs little higher today, continue to monitor trend for now  -07/22/23 CBGs a little worse today, looks like he was on toujeo 54U nightly before admission and is now eating better; will restart semglee at 5U nightly to start-- verified with pharmacy that this would be an ok start.   -9/1 3 CBGs controlled overall  continue to monitor CBG (last 3)  Recent Labs    07/26/23 2127 07/27/23 0546 07/27/23 1210  GLUCAP 170* 120* 205*      13: Hx of CAD s/p CABG 2016: on Plavix, asa, BB; ? resume statin   14: CKD IIIa: baseline Cr ~1.3             -follow-up BMP  9/3--Cr 1.54, near baseline---no changes  -encourage appropriate fluids  9/9 BUN/ CR trending up. ---push fluids  -recheck bmet tomorrow  -might need IVF 9/10 creatinine 1.55 although BUN up to 26.  Will add some IVF normal saline at night 9/12 creatinine and BUN improved today to 1.2/18  15: GI prophylaxis: continue Protonix 40mg  daily   16.  Bowel and bladder incontinence.    PVRs. -last Bms noted to be smears on 9/2 and 9/1, schedule miralax for constipation (see below) -9/13 intermittent incontinence of bladder, will check additional PVRs and IC greater than 350  17. R knee pain  -Start voltaren gel, consider xray if not improved by tomorrow  18. Malnutrition, severe  -9/5 RD following, appreciate assistance, continue to encourage PO intake   -Family plans to bring more outside food to help stimulate his appetite  -9/13 will do calorie count  19. Constipation  -9/5 continue miralax increase to BID, Check ABD xray  -9/6 moderate stool burden, will order sorbitol x1 -9/9 still no bm since 9/5 (smears)  -sorbitol 60cc today  -SSE if needed this afternoon -9/12 patient has been declining MiraLAx -9/13 sorbitol 45 mL ordered    LOS: 13 days A FACE TO FACE EVALUATION WAS PERFORMED  Fanny Dance 07/27/2023, 1:18 PM

## 2023-07-27 NOTE — Progress Notes (Signed)
Occupational Therapy Session Note  Patient Details  Name: NOSSON COVAULT MRN: [id number] Date of Birth: [birth year]  Today's Date: 07/27/2023 Session 1 OT Individual Time: 6237-6283 OT Individual Time Calculation (min): 60 min   Session 2 OT Individual Time: 1517-6160 OT Individual Time Calculation (min): 45 min    Short Term Goals: Week 2:  OT Short Term Goal 1 (Week 2): Patient will stand and free one hand at a time to help manage pants up or down during toileting OT Short Term Goal 2 (Week 2): Patient will identify need to void, and trasnfer to commode with min assist OT Short Term Goal 3 (Week 2): Pt will maintain dynamic standing balance with min A for 2 minutes in preparation for BADL tasks  Skilled Therapeutic Interventions/Progress Updates:  Session 1   Pt greeted seated EOB finishing some food his wife brought from home. Discussed dc plan and DME needs with spouse while pt finished eating. Pt then ambulated 25 feet into hallway w/ RW, min A, and cues for L LE clearance. Pt ambulated another 20 feet w./ RW and min A. UB/LB strength and cardiovascular endurance using NuStep on level 4 for 10 minutes. Addressed standing balance and weight shifting for step with alternating toe taps on medium cones in standing w/ RW. Min to mod A for standing balance. Sit<>stands with reaching to the R to facilitate weight shift onto R side. Pt returned to room and left seated in wc with alarm bely on, call bell in reach, and needs met. 7/10 back pain, rest and repositioned  Session 2 Pt greeted seated in wc with spouse present and agreeable to OT treatment session. Pt brought to therapy gym and worked on functional ambulation with focus on clearing L LE and stepping through by kicking yoga block while ambulating. Pt needed min to mod A for balance when kicking due to some scissoring. Graded task down to focus just on the motion of kicking standing from therapy mat. Facilitation for weight shift but  improved kick and follow through. Pt needed mod A to stand from mat due to fatigue and RW. Pt pivoted to wc with min A and cues for RW management. Pt returned to room and completed stand-pivot back to bed with min A. Pt left semi-reclined in bed with bed alarm on, call bell in reach, and needs met.   Therapy Documentation Precautions:  Precautions Precautions: Fall Precaution Comments: left hemiparesis, pusher Restrictions Weight Bearing Restrictions: No Other Position/Activity Restrictions: Rt sided percutaneous drain  Pain: Pain Assessment Pain Score: 5  Back pain, rest and repositioned    Therapy/Group: Individual Therapy  [name] 07/27/2023, 1:50 PM

## 2023-07-27 NOTE — Progress Notes (Signed)
Physical Therapy Session Note  Patient Details  Name: Hunter Sparks MRN: [id number] Date of Birth: 1946/06/12  Today's Date: 07/27/2023 PT Individual Time: 9562-1308 PT Individual Time Calculation (min): 61 min   Short Term Goals: Week 2:  PT Short Term Goal 1 (Week 2): Pt will complete bed<>WC transfer with mod assist and LRAD PT Short Term Goal 2 (Week 2): Pt will ambulate with RW 63' with mod assist x1 PT Short Term Goal 3 (Week 2): Pt will complete WC mobility min assist 50' PT Short Term Goal 4 (Week 2): Pt will maintain standing balance with min assist x1 and LRAD  Skilled Therapeutic Interventions/Progress Updates:    Pt presents in room in [facility], reporting pain in back, premedicated, agreeable to PT. Session focused on gait training with RW as well as therex to promote BLE. Pt completes sit<>stands with CGA/min assist to RW throughout session.  Threapist donned socks and shoes total assist and transported dependently to day room for time management. Pt completes gait 3x54' with RW min/mod assist with fatigue and 2nd person WC follow. Pt ambulates 30' with RW x2 turns no WC follow. Pt demonstrates improved weightshifting to R for LLE advance phase however does require assist for weightshift with fatigue. Trialed L AFO with one gait trial however no significant difference noted with L foot clearance. Pt with improved foot clearance with clear verbal cues for "big" step with L foot.  Pt completes ambulatory transfer to/from [facility] to nustep ~15' with RW min assist with verbal cues for positioning prior to sitting. Pt completes nustep BUE/BLE 2 min, BLE only for 1 minute to promote BLE strengthening and muscular enduranace needed for gait and functional transfers. Pt returns to [facility] via ambulatory transfer min assist.  Pt returns to room and remains seated in [facility] with all needs within reach, cal light in place and chair alarm donned and activated at end of session.   Therapy  Documentation Precautions:  Precautions Precautions: Fall Precaution Comments: left hemiparesis, pusher Restrictions Weight Bearing Restrictions: No Other Position/Activity Restrictions: Rt sided percutaneous drain   Therapy/Group: Individual Therapy  Edwin Cap PT, DPT 07/27/2023, 4:44 PM

## 2023-07-27 NOTE — Progress Notes (Signed)
Speech Language Pathology Daily Session Note  Patient Details  Name: Hunter Sparks MRN: [id number] Date of Birth: 29-May-1946  Today's Date: 07/27/2023 SLP Individual Time: 1400-1443 SLP Individual Time Calculation (min): 43 min  Short Term Goals: Week 2: SLP Short Term Goal 1 (Week 2): Patient will utilize external aids to orient to time with Min verbal cues. SLP Short Term Goal 2 (Week 2): Patient will demonstrate functional problem solving for basic and familiar tasks with Min verbal cues. SLP Short Term Goal 3 (Week 2): Patient will utilize memory compensatory strategies to recall information during structured tasks with 75% accuracy and Min verbal and visual cues. SLP Short Term Goal 4 (Week 2): Patient will demonstrate selective attention in a mildly distracting enviornment to a functional task for 15 minutes with supervision.  Skilled Therapeutic Interventions: SLP conducted skilled therapy session targeting cognitive retraining goals. Patient asleep upon entry but easily rousable to verbal/auditory stimuli. Patient agreeable to speech therapy. Utilizing schedule, patient identified several events of the morning given minA. During structured therapy task targeting basic memory and attention skills, patient benefited from maxA to recall game rules and required intermittent reorientation to game expectations throughout. Patient attended to game for 30+ minutes given supervision cues. Patient was left in lowered bed with call bell in reach and bed alarm set. SLP will continue to target goals per plan of care.      Pain Pain Assessment Pain Scale: 0-10 Pain Score: 0-No pain  Therapy/Group: Individual Therapy  Jeannie Done, M.A., CCC-SLP  [name] 07/27/2023, 3:14 PM

## 2023-07-28 LAB — GLUCOSE, CAPILLARY
Glucose-Capillary: 113 mg/dL — ABNORMAL HIGH (ref 70–99)
Glucose-Capillary: 158 mg/dL — ABNORMAL HIGH (ref 70–99)
Glucose-Capillary: 295 mg/dL — ABNORMAL HIGH (ref 70–99)
Glucose-Capillary: 188 mg/dL — ABNORMAL HIGH (ref 70–99)

## 2023-07-28 MED ORDER — SORBITOL 70 % SOLN
60.0000 mL | Freq: Once | Status: AC
  Administered 2023-07-28: 60 mL via ORAL
  Filled 2023-07-28: qty 60

## 2023-07-28 NOTE — Progress Notes (Signed)
Pt refusing PRN enema at this time. Pt states he is not taking anymore sorbitol either. He states, "I will have a bowel movement when I go home and only then." Notified pt of the risks of going several days with no bowel movement and he voices understanding.

## 2023-07-28 NOTE — Progress Notes (Signed)
PROGRESS NOTE   Subjective/Complaints:  Pt doing alright, slept well. Denies pain. Unsure of LBM, but appears he hasn't had a documented BM since 9/9. Urinating ok, occasionally incontinent. Denies any other complaints or concerns but questionable historian.   ROS: Limited due to cognitive/behavioral   Objective:   No results found. No results for input(s): "WBC", "HGB", "HCT", "PLT" in the last 72 hours.   Recent Labs    07/26/23 0743  NA 138  K 3.9  CL 106  CO2 24  GLUCOSE 139*  BUN 18  CREATININE 1.20  CALCIUM 8.8*     Intake/Output Summary (Last 24 hours) at 07/28/2023 1216 Last data filed at 07/28/2023 0827 Gross per 24 hour  Intake 242 ml  Output 420 ml  Net -178 ml        Physical Exam: Vital Signs Blood pressure (!) 147/74, pulse (!) 59, temperature 97.9 F (36.6 C), temperature source Oral, resp. rate 18, height 6' (1.829 m), weight 75 kg, SpO2 97%.  Constitutional: No distress . Vital signs reviewed.  Lying in the bed HEENT: NCAT, EOMI, oral membranes moist Neck: supple Cardiovascular: RRR without murmur. No JVD    Respiratory/Chest: CTA Bilaterally without wheezes or rales. Normal effort    GI/Abdomen: BS +, non-tender, non-distended Ext: no clubbing, cyanosis, or edema Psych: flat, slow to process, appears frustrated  PRIOR EXAMS: Skin: C/D/I. No apparent lesions. Drain site intact MSK:      No joint swelling or tenderness noted Neurologic exam:  Cognition: Awake and alert.  Oriented to person, place, Poor insight.  Can perseverate, easily distractible Sensation: To light touch intact in BL UEs and LEs  CN: 2-12 grossly intact.  Coordination:  Bilateral upper extremity ataxia, left greater than right.  Strength: 5 out of 5 strength throughout bilateral upper extremities 5 out of 5 strength in right lower extremity 4+ out of 5 strength throughout left lower extremity--no  change   Assessment/Plan: 1. Functional deficits which require 3+ hours per day of interdisciplinary therapy in a comprehensive inpatient rehab setting. Physiatrist is providing close team supervision and 24 hour management of active medical problems listed below. Physiatrist and rehab team continue to assess barriers to discharge/monitor patient progress toward functional and medical goals  Care Tool:  Bathing  Bathing activity did not occur: Refused Body parts bathed by patient: Right arm, Chest, Left arm, Abdomen, Front perineal area, Buttocks, Right upper leg, Left upper leg, Face   Body parts bathed by helper: Left lower leg, Right lower leg     Bathing assist Assist Level: Minimal Assistance - Patient > 75%     Upper Body Dressing/Undressing Upper body dressing   What is the patient wearing?: Pull over shirt    Upper body assist Assist Level: Minimal Assistance - Patient > 75%    Lower Body Dressing/Undressing Lower body dressing      What is the patient wearing?: Pants     Lower body assist Assist for lower body dressing: Moderate Assistance - Patient 50 - 74%     Toileting Toileting Toileting Activity did not occur (Clothing management and hygiene only): N/A (no void or bm)  Toileting assist Assist for  toileting: Minimal Assistance - Patient > 75%     Transfers Chair/bed transfer  Transfers assist     Chair/bed transfer assist level: Minimal Assistance - Patient > 75%     Locomotion Ambulation   Ambulation assist      Assist level: 2 helpers Assistive device: Walker-rolling Max distance: 55ft   Walk 10 feet activity   Assist  Walk 10 feet activity did not occur: Safety/medical concerns  Assist level: 2 helpers Assistive device: Walker-rolling   Walk 50 feet activity   Assist Walk 50 feet with 2 turns activity did not occur: Safety/medical concerns  Assist level: 2 helpers Assistive device: Walker-rolling    Walk 150 feet  activity   Assist Walk 150 feet activity did not occur: Safety/medical concerns         Walk 10 feet on uneven surface  activity   Assist Walk 10 feet on uneven surfaces activity did not occur: Safety/medical concerns         Wheelchair     Assist Is the patient using a wheelchair?: Yes Type of Wheelchair: Manual    Wheelchair assist level: Moderate Assistance - Patient 50 - 74% Max wheelchair distance: 20'    Wheelchair 50 feet with 2 turns activity    Assist        Assist Level: Moderate Assistance - Patient 50 - 74%   Wheelchair 150 feet activity     Assist      Assist Level: Total Assistance - Patient < 25%   Blood pressure (!) 147/74, pulse (!) 59, temperature 97.9 F (36.6 C), temperature source Oral, resp. rate 18, height 6' (1.829 m), weight 75 kg, SpO2 97%.  1. 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  Antithrombotics: -DVT/anticoagulation:  Pharmaceutical: Heparin 5000U q8h -antiplatelet therapy: Aspirin and Plavix for three weeks followed by aspirin alone>>last dose of Plavix 8/30.    3. Pain Management: chronic back pain -Tylenol and Robaxin as needed, Voltaren QID, lidoderm daily             -continue tramadol 50 mg TID (ordered as q6h PRN currently) -Patient reporting chronic pain in multiple areas of his body, continue low-dose gabapentin 100 mg 3 times daily--may need to consider backing off he remains lethargic -9/10 lower back pain today.  Will schedule Tylenol, do not know if he remembers to ask for it. -9/13 continue scheduled Tylenol and current as needed medications   4. Mood/Behavior/Sleep/delirium: LCSW to evaluate and provide  emotional support             -continue trazodone 50mg  q HS             -antipsychotic agents: reduce Zyprexa to 2.5 mg daily (already got 9/9 dose) -9/9 no new scheduled meds at bedtime currently   -will change timing of trazodone to 2100 5. Neuropsych/cognition: This patient is not quite capable of making decisions on his own behalf.   6. Skin/Wound Care: Routine skin care checks   7. Fluids/Electrolytes/Nutrition: Routine Is and Os and follow-up chemistries             -dys 3 with thin  liquids             -SLP eval, cont vitamins   I personally reviewed the patient's labs today.    8: Hypertension: monitor TID and prn (home Cozaar 25 mg daily held)             -continue metoprolol 50 mg BID -9/12 patient had 2 blood pressure readings that were elevated yesterday however overall has been controlled, continue to monitor trend -07/28/23 slightly elevated BPs last night and this morning but had been better, monitor trend for now but may need to restart home meds Vitals:   07/24/23 1927 07/25/23 0429 07/25/23 1526 07/25/23 2214  BP: 137/66 135/72 112/62 (!) 162/86   07/26/23 0337 07/26/23 1344 07/26/23 1913 07/26/23 2228  BP: (!) 162/75 108/62 (!) 129/57 102/76   07/27/23 0345 07/27/23 1653 07/27/23 1918 07/28/23 0522  BP: 109/63 132/73 (!) 157/80 (!) 147/74     9: Hyperlipidemia: statin held (follow-up LFTs and restart atorvastatin 40 mg as appropriate)   10: Hypothyroidism: continue Synthroid daily   11: Acute cholecystitis s/p cholecystostomy tube             -routine drain care. Site is fine             -continue Levaquin 250 mg daily  -9/13 patient asked about timing of possible cholecystectomy surgery.  Advised he will need to discuss this outpatient with general surgery follow-up   12: DM-2: CBGs QID; A1c = 8.8%; on insulin at home             -continue SSI  -9/6 CBGs little higher today, continue to monitor trend for now -07/22/23 CBGs a little worse today, looks like  he was on toujeo 54U nightly before admission and is now eating better; will restart semglee at 5U nightly to start-- verified with pharmacy that this would be an ok start.   -07/28/23 CBGs controlled overall, downtrending, continue to monitor CBG (last 3)  Recent Labs    07/27/23 2048 07/28/23 0548 07/28/23 1111  GLUCAP 125* 113* 158*      13: Hx of CAD s/p CABG 2016: on Plavix, asa, BB; ? resume statin   14: CKD IIIa: baseline Cr ~1.3             -follow-up BMP  -9/3--Cr 1.54, near baseline---no changes  -encourage appropriate fluids  -9/9 BUN/ CR trending up. ---push fluids  -recheck bmet tomorrow  -might need IVF -9/10 creatinine 1.55 although BUN up to 26.  Will add some IVF normal saline at night -9/12 creatinine and BUN improved today to 1.2/18, continue IVFs for now  15: GI prophylaxis: continue Protonix 40mg  daily   16.  Bowel and bladder incontinence.    PVRs. -last Bms noted to be smears on 9/2 and 9/1, schedule miralax for constipation (see below) -9/13 intermittent incontinence of bladder, will check additional PVRs and IC greater than 350 -07/28/23 PVR 134 once, no others documented, monitor  17. R knee pain  -Start voltaren gel, consider xray if not improved by tomorrow  18. Malnutrition, severe  -9/5 RD following, appreciate assistance, continue to encourage PO intake   -Family plans to bring more outside food to help stimulate his appetite  -9/13 will do calorie count  19. Constipation: meds: miralax BID, senokot S 2 tabs daily  -9/5 continue miralax increase to BID, Check ABD xray  -9/6 moderate stool burden, will order sorbitol x1 -9/9 still no bm since 9/5 (smears)  -  sorbitol 60cc today  -SSE if needed this afternoon -9/12 patient has been declining MiraLAx -9/13 sorbitol 45 mL ordered -07/28/23 no BM since 9/9, took miralax yesterday and this morning; will do another sorbitol 60ml now since that worked last time, if no BM by tomorrow then may  consider dulcolax/enema.     LOS: 14 days A FACE TO FACE EVALUATION WAS PERFORMED  [address] 07/28/2023, 12:16 PM

## 2023-07-28 NOTE — Progress Notes (Signed)
Nutrition Brief Note  MD ordered 48 hour calorie count on 07/27/23. Pt is currently being followed by RD and last follow-up assessment was on 07/24/23. Updated calorie count order with instructions. Discussed with RN via secure chat who reports she will hang calorie count envelope on door.  Please hang calorie count envelope on the patient's door. Document percent consumed for each item on the patient's meal tray ticket and keep in envelope. Also document percent of any supplement or snack pt consumes and keep documentation in envelope for RD to review.   RD will follow-up on calorie count results after the weekend.  Letta Median, MS, RD, LDN, CNSC Pager number available on Amion

## 2023-07-29 LAB — GLUCOSE, CAPILLARY
Glucose-Capillary: 123 mg/dL — ABNORMAL HIGH (ref 70–99)
Glucose-Capillary: 127 mg/dL — ABNORMAL HIGH (ref 70–99)
Glucose-Capillary: 178 mg/dL — ABNORMAL HIGH (ref 70–99)
Glucose-Capillary: 195 mg/dL — ABNORMAL HIGH (ref 70–99)

## 2023-07-29 NOTE — Plan of Care (Signed)
  Problem: RH BLADDER ELIMINATION Goal: RH STG MANAGE BLADDER WITH ASSISTANCE Description: STG Manage Bladder With min Assistance Outcome: Progressing   Problem: RH SKIN INTEGRITY Goal: RH STG SKIN FREE OF INFECTION/BREAKDOWN Description: Incision with bulb will continue to drain and remain free of infection/breakdown with min assist  Outcome: Progressing   Problem: RH SAFETY Goal: RH STG ADHERE TO SAFETY PRECAUTIONS W/ASSISTANCE/DEVICE Description: STG Adhere to Safety Precautions With cueing Assistance/Device. Outcome: Progressing

## 2023-07-29 NOTE — Progress Notes (Signed)
PROGRESS NOTE   Subjective/Complaints:  Pt doing ok, a little irritable today. Slept poorly but won't elaborate. Wants to leave. States pain is controlled. Still hasn't had a BM, initially refused the sorbitol yesterday but apparently got it around 2pm, still hasn't had BM after that though. Refused enema, and continues to refuse. Doesn't want anything else for BMs, says he won't get any help with his BM if he needs to go-- educated pt on asking nursing staff for assistance.  Urinating ok, occasionally incontinent but he states it's because they don't leave his urinal nearby. Denies any other complaints or concerns but questionable historian.   ROS: Limited due to cognitive/behavioral   Objective:   No results found. No results for input(s): "WBC", "HGB", "HCT", "PLT" in the last 72 hours.   No results for input(s): "NA", "K", "CL", "CO2", "GLUCOSE", "BUN", "CREATININE", "CALCIUM" in the last 72 hours.    Intake/Output Summary (Last 24 hours) at 07/29/2023 1246 Last data filed at 07/29/2023 1610 Gross per 24 hour  Intake 0 ml  Output 650 ml  Net -650 ml        Physical Exam: Vital Signs Blood pressure (!) 145/67, pulse 71, temperature 97.9 F (36.6 C), temperature source Oral, resp. rate 18, height 6' (1.829 m), weight 75 kg, SpO2 98%.  Constitutional: No distress . Vital signs reviewed.  Lying in the bed, much more irritable today HEENT: NCAT, EOMI, oral membranes moist Neck: supple Cardiovascular: RRR without murmur. No JVD    Respiratory/Chest: CTA Bilaterally without wheezes or rales. Normal effort    GI/Abdomen: BS +, non-tender, non-distended, soft, drain in RUQ area. Scant greenish fluid in drain bag.  Ext: no clubbing, cyanosis, or edema Psych: flat, slow to process, appears frustrated, very irritable today  PRIOR EXAMS: Skin: C/D/I. No apparent lesions. Drain site intact MSK:      No joint swelling or  tenderness noted Neurologic exam:  Cognition: Awake and alert.  Oriented to person, place, Poor insight.  Can perseverate, easily distractible Sensation: To light touch intact in BL UEs and LEs  CN: 2-12 grossly intact.  Coordination:  Bilateral upper extremity ataxia, left greater than right.  Strength: 5 out of 5 strength throughout bilateral upper extremities 5 out of 5 strength in right lower extremity 4+ out of 5 strength throughout left lower extremity--no change   Assessment/Plan: 1. Functional deficits which require 3+ hours per day of interdisciplinary therapy in a comprehensive inpatient rehab setting. Physiatrist is providing close team supervision and 24 hour management of active medical problems listed below. Physiatrist and rehab team continue to assess barriers to discharge/monitor patient progress toward functional and medical goals  Care Tool:  Bathing  Bathing activity did not occur: Refused Body parts bathed by patient: Right arm, Chest, Left arm, Abdomen, Front perineal area, Buttocks, Right upper leg, Left upper leg, Face   Body parts bathed by helper: Left lower leg, Right lower leg     Bathing assist Assist Level: Minimal Assistance - Patient > 75%     Upper Body Dressing/Undressing Upper body dressing   What is the patient wearing?: Pull over shirt    Upper body assist Assist Level:  Minimal Assistance - Patient > 75%    Lower Body Dressing/Undressing Lower body dressing      What is the patient wearing?: Pants     Lower body assist Assist for lower body dressing: Moderate Assistance - Patient 50 - 74%     Toileting Toileting Toileting Activity did not occur Press photographer and hygiene only): N/A (no void or bm)  Toileting assist Assist for toileting: Minimal Assistance - Patient > 75%     Transfers Chair/bed transfer  Transfers assist     Chair/bed transfer assist level: Minimal Assistance - Patient > 75%      Locomotion Ambulation   Ambulation assist      Assist level: 2 helpers Assistive device: Walker-rolling Max distance: 12ft   Walk 10 feet activity   Assist  Walk 10 feet activity did not occur: Safety/medical concerns  Assist level: 2 helpers Assistive device: Walker-rolling   Walk 50 feet activity   Assist Walk 50 feet with 2 turns activity did not occur: Safety/medical concerns  Assist level: 2 helpers Assistive device: Walker-rolling    Walk 150 feet activity   Assist Walk 150 feet activity did not occur: Safety/medical concerns         Walk 10 feet on uneven surface  activity   Assist Walk 10 feet on uneven surfaces activity did not occur: Safety/medical concerns         Wheelchair     Assist Is the patient using a wheelchair?: Yes Type of Wheelchair: Manual    Wheelchair assist level: Moderate Assistance - Patient 50 - 74% Max wheelchair distance: 30'    Wheelchair 50 feet with 2 turns activity    Assist        Assist Level: Moderate Assistance - Patient 50 - 74%   Wheelchair 150 feet activity     Assist      Assist Level: Total Assistance - Patient < 25%   Blood pressure (!) 145/67, pulse 71, temperature 97.9 F (36.6 C), temperature source Oral, resp. rate 18, height 6' (1.829 m), weight 75 kg, SpO2 98%.  1. 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  Antithrombotics: -DVT/anticoagulation:  Pharmaceutical: Heparin 5000U q8h -antiplatelet therapy: Aspirin and Plavix for three weeks followed by aspirin alone>>last dose of Plavix 8/30.    3. Pain Management: chronic back pain -Tylenol and Robaxin as needed, Voltaren QID,  lidoderm daily             -continue tramadol 50 mg TID (ordered as q6h PRN currently) -Patient reporting chronic pain in multiple areas of his body, continue low-dose gabapentin 100 mg 3 times daily--may need to consider backing off he remains lethargic -9/10 lower back pain today.  Will schedule Tylenol, do not know if he remembers to ask for it. -9/13 continue scheduled Tylenol and current as needed medications   4. Mood/Behavior/Sleep/delirium: LCSW to evaluate and provide emotional support             -continue trazodone 50mg  q HS             -antipsychotic agents: reduce Zyprexa to 2.5 mg daily (already got  9/9 dose) -9/9 no new scheduled meds at bedtime currently   -will change timing of trazodone to 2100 5. Neuropsych/cognition: This patient is not quite capable of making decisions on his own behalf.   6. Skin/Wound Care: Routine skin care checks   7. Fluids/Electrolytes/Nutrition: Routine Is and Os and follow-up chemistries             -dys 3 with thin liquids             -SLP eval, cont vitamins   I personally reviewed the patient's labs today.    8: Hypertension: monitor TID and prn (home Cozaar 25 mg daily held)             -continue metoprolol 50 mg BID -9/12 patient had 2 blood pressure readings that were elevated yesterday however overall has been controlled, continue to monitor trend -07/28/23 slightly elevated BPs last night and this morning but had been better, monitor trend for now but may need to restart home meds -07/29/23 BPs a bit better, cont to watch trend Vitals:   07/25/23 2214 07/26/23 0337 07/26/23 1344 07/26/23 1913  BP: (!) 162/86 (!) 162/75 108/62 (!) 129/57   07/26/23 2228 07/27/23 0345 07/27/23 1653 07/27/23 1918  BP: 102/76 109/63 132/73 (!) 157/80   07/28/23 0522 07/28/23 1433 07/28/23 1945 07/29/23 0427  BP: (!) 147/74 (!) 132/59 130/68 (!) 145/67     9: Hyperlipidemia: statin held (follow-up LFTs and restart atorvastatin 40 mg as appropriate)    10: Hypothyroidism: continue Synthroid daily   11: Acute cholecystitis s/p cholecystostomy tube             -routine drain care. Site is fine             -continue Levaquin 250 mg daily -9/13 patient asked about timing of possible cholecystectomy surgery.  Advised he will need to discuss this outpatient with general surgery follow-up   12: DM-2: CBGs QID; A1c = 8.8%; on insulin at home             -continue SSI  -9/6 CBGs little higher today, continue to monitor trend for now -07/22/23 CBGs a little worse today, looks like he was on toujeo 54U nightly before admission and is now eating better; will restart semglee at 5U nightly to start-- verified with pharmacy that this would be an ok start.   -9/14-15/24 CBGs controlled overall, downtrending, continue to monitor CBG (last 3)  Recent Labs    07/28/23 2110 07/29/23 0544 07/29/23 1142  GLUCAP 188* 123* 127*      13: Hx of CAD s/p CABG 2016: on Plavix, asa, BB; ? resume statin   14: CKD IIIa: baseline Cr ~1.3             -follow-up BMP  -9/3--Cr 1.54, near baseline---no changes  -encourage appropriate fluids  -9/9 BUN/ CR trending up. ---push fluids  -recheck bmet tomorrow  -might need IVF -9/10 creatinine 1.55 although BUN up to 26.  Will add some IVF normal saline at night -9/12 creatinine and BUN improved today to 1.2/18, continue IVFs for now  15: GI prophylaxis: continue Protonix 40mg  daily   16.  Bowel and bladder incontinence.    PVRs. -last Bms noted to be smears on 9/2 and 9/1, schedule miralax for constipation (see below) -9/13 intermittent incontinence of bladder, will check additional PVRs and IC greater than 350 -07/28/23 PVR 134 once, no others documented, monitor  17. R knee pain  -Start voltaren gel, consider  xray if not improved by tomorrow  18. Malnutrition, severe  -9/5 RD following, appreciate assistance, continue to encourage PO intake   -Family plans to bring more outside food to help stimulate  his appetite  -9/13 will do calorie count  19. Constipation: meds: miralax BID, senokot S 2 tabs daily  -9/5 continue miralax increase to BID, Check ABD xray  -9/6 moderate stool burden, will order sorbitol x1 -9/9 still no bm since 9/5 (smears)  -sorbitol 60cc today  -SSE if needed this afternoon -9/12 patient has been declining MiraLAx -9/13 sorbitol 45 mL ordered -07/28/23 no BM since 9/9, took miralax yesterday and this morning; will do another sorbitol 60ml now since that worked last time, if no BM by tomorrow then may consider dulcolax/enema.  -07/29/23 still no BM, pt very irritated and not very agreeable; will see if he will accept dulcolax, already declined enema so I doubt he'll agree to that; will have nursing try to get dulcolax in today.     LOS: 15 days A FACE TO FACE EVALUATION WAS PERFORMED  [address] 07/29/2023, 12:46 PM

## 2023-07-29 NOTE — Progress Notes (Signed)
Patient refused insulin for dinner. Stated he wants to be left alone. Nurse informed patient on the importance of taking insulin - but patient was seen increasingly agitated and stated, "I don't want that insulin. I don't want to eat either. I want you to leave me alone."   Charge nurse was informed.  Patient verbalized agreement on taking insulin to charge nurse, if we leave him alone afterwards, but then grabbed saline flush and broke it in half, screaming "leave me alone!"   Nurse had set the bed low, alarm on, and other safety measures were implemented before leaving room.

## 2023-07-30 ENCOUNTER — Inpatient Hospital Stay (HOSPITAL_COMMUNITY): Payer: PPO

## 2023-07-30 DIAGNOSIS — F02B18 Dementia in other diseases classified elsewhere, moderate, with other behavioral disturbance: Secondary | ICD-10-CM

## 2023-07-30 LAB — BASIC METABOLIC PANEL
Anion gap: 8 (ref 5–15)
BUN: 15 mg/dL (ref 8–23)
CO2: 26 mmol/L (ref 22–32)
Calcium: 8.9 mg/dL (ref 8.9–10.3)
Chloride: 106 mmol/L (ref 98–111)
Creatinine, Ser: 1.24 mg/dL (ref 0.61–1.24)
GFR, Estimated: 59 mL/min — ABNORMAL LOW (ref >60)
Glucose, Bld: 139 mg/dL — ABNORMAL HIGH (ref 70–99)
Potassium: 3.9 mmol/L (ref 3.5–5.1)
Sodium: 140 mmol/L (ref 135–145)

## 2023-07-30 LAB — CBC
HCT: 38.9 % — ABNORMAL LOW (ref 39.0–52.0)
Hemoglobin: 12.7 g/dL — ABNORMAL LOW (ref 13.0–17.0)
MCH: 30 pg (ref 26.0–34.0)
MCHC: 32.6 g/dL (ref 30.0–36.0)
MCV: 92 fL (ref 80.0–100.0)
Platelets: 234 10*3/uL (ref 150–400)
RBC: 4.23 MIL/uL (ref 4.22–5.81)
RDW: 13.1 % (ref 11.5–15.5)
WBC: 8 10*3/uL (ref 4.0–10.5)
nRBC: 0 % (ref 0.0–0.2)

## 2023-07-30 LAB — GLUCOSE, CAPILLARY
Glucose-Capillary: 123 mg/dL — ABNORMAL HIGH (ref 70–99)
Glucose-Capillary: 287 mg/dL — ABNORMAL HIGH (ref 70–99)
Glucose-Capillary: 161 mg/dL — ABNORMAL HIGH (ref 70–99)
Glucose-Capillary: 130 mg/dL — ABNORMAL HIGH (ref 70–99)

## 2023-07-30 LAB — HEPATIC FUNCTION PANEL
ALT: 16 U/L (ref 0–44)
AST: 20 U/L (ref 15–41)
Albumin: 3.3 g/dL — ABNORMAL LOW (ref 3.5–5.0)
Alkaline Phosphatase: 67 U/L (ref 38–126)
Bilirubin, Direct: 0.1 mg/dL (ref 0.0–0.2)
Indirect Bilirubin: 0.4 mg/dL (ref 0.3–0.9)
Total Bilirubin: 0.5 mg/dL (ref 0.3–1.2)
Total Protein: 6.5 g/dL (ref 6.5–8.1)

## 2023-07-30 MED ORDER — INSULIN GLARGINE-YFGN 100 UNIT/ML ~~LOC~~ SOLN
7.0000 [IU] | Freq: Every day | SUBCUTANEOUS | Status: DC
  Administered 2023-07-30 – 2023-07-31 (×2): 7 [IU] via SUBCUTANEOUS
  Filled 2023-07-30 (×3): qty 0.07

## 2023-07-30 MED ORDER — LOSARTAN POTASSIUM 25 MG PO TABS
25.0000 mg | ORAL_TABLET | Freq: Every day | ORAL | Status: DC
  Administered 2023-07-30 – 2023-08-04 (×6): 25 mg via ORAL
  Filled 2023-07-30 (×6): qty 1

## 2023-07-30 MED ORDER — POLYETHYLENE GLYCOL 3350 17 G PO PACK: 127.5000 g | PACK | Freq: Once | ORAL | Status: DC

## 2023-07-30 MED ORDER — DICLOFENAC SODIUM 1 % EX GEL
2.0000 g | Freq: Four times a day (QID) | CUTANEOUS | Status: DC | PRN
  Administered 2023-07-31: 2 g via TOPICAL

## 2023-07-30 MED ORDER — ACETAMINOPHEN 325 MG PO TABS: 650.0000 mg | ORAL_TABLET | Freq: Four times a day (QID) | ORAL | Status: DC | PRN

## 2023-07-30 NOTE — Progress Notes (Signed)
Informed by attendant RN that he is unable to flush cholecystostomy tube. Placed consult page to IR, Dr. Milford Cage.

## 2023-07-30 NOTE — Progress Notes (Signed)
Occupational Therapy Session Note  Patient Details  Name: Hunter Sparks MRN: [id number] Date of Birth: [birth year]  Today's Date: 07/30/2023 OT Individual Time: 1400-1425 OT Individual Time Calculation (min): 25 min    Short Term Goals: Week 2:  OT Short Term Goal 1 (Week 2): Patient will stand and free one hand at a time to help manage pants up or down during toileting OT Short Term Goal 2 (Week 2): Patient will identify need to void, and trasnfer to commode with min assist OT Short Term Goal 3 (Week 2): Pt will maintain dynamic standing balance with min A for 2 minutes in preparation for BADL tasks  Skilled Therapeutic Interventions/Progress Updates:    Pt resting in w/c upon arrival and c/o being "worn out." Pt agreeable to block practice of sit<>stand and stand pivot transfers with focus on safety awareness and safe technique when sitting down. Pt requires mod verbal cues to position correctly before sitting down. Pt returned to bed at end of session. Sit>supine with min A for LLE mgmt. Pt remained in bed. Bed alarm set on middle setting, telesitter present, and all needs wihtin reach.   Therapy Documentation Precautions:  Precautions Precautions: Fall Precaution Comments: left hemiparesis, pusher Restrictions Weight Bearing Restrictions: No Other Position/Activity Restrictions: Rt sided percutaneous drain Pain:  Pt denies pain this afternoon  Therapy/Group: Individual Therapy  Rich Brave 07/30/2023, 2:42 PM

## 2023-07-30 NOTE — Progress Notes (Addendum)
PROGRESS NOTE   Subjective/Complaints:  Patient refused insulin yesterday and had episode of agitation noted by nursing.  Appears to be more calm this morning, participating with therapy.  He denies pain this morning.  ROS: Limited due to cognitive/behavioral   Objective:   No results found. Recent Labs    07/30/23 0645  WBC 8.0  HGB 12.7*  HCT 38.9*  PLT 234     Recent Labs    07/30/23 0645  NA 140  K 3.9  CL 106  CO2 26  GLUCOSE 139*  BUN 15  CREATININE 1.24  CALCIUM 8.9      Intake/Output Summary (Last 24 hours) at 07/30/2023 1350 Last data filed at 07/30/2023 1323 Gross per 24 hour  Intake 245 ml  Output 730 ml  Net -485 ml        Physical Exam: Vital Signs Blood pressure (!) 149/72, pulse 84, temperature 98.1 F (36.7 C), temperature source Oral, resp. rate 18, height 6' (1.829 m), weight 75 kg, SpO2 98%.  Constitutional: No distress . Vital signs reviewed.  Lying in the bed HEENT: NCAT, EOMI, oral membranes moist Neck: supple Cardiovascular: RRR without murmur. No JVD    Respiratory/Chest: CTA Bilaterally without wheezes or rales. Normal effort    GI/Abdomen: BS +, non-tender, non-distended, soft, drain in RUQ area. Scant greenish fluid in drain bag.  Ext: no clubbing, cyanosis, or edema Psych: flat, calm this morning Skin: C/D/I. No apparent lesions. Drain site intact MSK:      No joint swelling or tenderness noted Neurologic exam:  Cognition: Awake and alert.  Oriented to person, place, Poor insight.  Can perseverate, easily distractible Sensation: To light touch intact in BL UEs and LEs  CN: 2-12 grossly intact.  Coordination:  Bilateral upper extremity ataxia, left greater than right.  Strength: Moving all 4 extremities to gravity and resistance   Assessment/Plan: 1. Functional deficits which require 3+ hours per day of interdisciplinary therapy in a comprehensive inpatient rehab  setting. Physiatrist is providing close team supervision and 24 hour management of active medical problems listed below. Physiatrist and rehab team continue to assess barriers to discharge/monitor patient progress toward functional and medical goals  Care Tool:  Bathing  Bathing activity did not occur: Refused Body parts bathed by patient: Right arm, Chest, Left arm, Abdomen, Front perineal area, Buttocks, Right upper leg, Left upper leg, Face   Body parts bathed by helper: Left lower leg, Right lower leg     Bathing assist Assist Level: Minimal Assistance - Patient > 75%     Upper Body Dressing/Undressing Upper body dressing   What is the patient wearing?: Pull over shirt    Upper body assist Assist Level: Minimal Assistance - Patient > 75%    Lower Body Dressing/Undressing Lower body dressing      What is the patient wearing?: Pants     Lower body assist Assist for lower body dressing: Moderate Assistance - Patient 50 - 74%     Toileting Toileting Toileting Activity did not occur (Clothing management and hygiene only): N/A (no void or bm)  Toileting assist Assist for toileting: Minimal Assistance - Patient > 75%  Transfers Chair/bed transfer  Transfers assist     Chair/bed transfer assist level: Minimal Assistance - Patient > 75%     Locomotion Ambulation   Ambulation assist      Assist level: 2 helpers Assistive device: Walker-rolling Max distance: 20ft   Walk 10 feet activity   Assist  Walk 10 feet activity did not occur: Safety/medical concerns  Assist level: 2 helpers Assistive device: Walker-rolling   Walk 50 feet activity   Assist Walk 50 feet with 2 turns activity did not occur: Safety/medical concerns  Assist level: 2 helpers Assistive device: Walker-rolling    Walk 150 feet activity   Assist Walk 150 feet activity did not occur: Safety/medical concerns         Walk 10 feet on uneven surface  activity   Assist Walk  10 feet on uneven surfaces activity did not occur: Safety/medical concerns         Wheelchair     Assist Is the patient using a wheelchair?: Yes Type of Wheelchair: Manual    Wheelchair assist level: Moderate Assistance - Patient 50 - 74% Max wheelchair distance: 41'    Wheelchair 50 feet with 2 turns activity    Assist        Assist Level: Moderate Assistance - Patient 50 - 74%   Wheelchair 150 feet activity     Assist      Assist Level: Total Assistance - Patient < 25%   Blood pressure (!) 149/72, pulse 84, temperature 98.1 F (36.7 C), temperature source Oral, resp. rate 18, height 6' (1.829 m), weight 75 kg, SpO2 98%.  1. 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  Antithrombotics: -DVT/anticoagulation:  Pharmaceutical: Heparin 5000U q8h -antiplatelet therapy: Aspirin and Plavix for three weeks followed by aspirin alone>>last dose of Plavix 8/30.    3. Pain Management: chronic back pain -Tylenol and Robaxin as needed, Voltaren QID, lidoderm daily             -continue tramadol 50 mg TID (ordered as q6h PRN currently) -Patient reporting chronic pain in multiple areas of his body, continue low-dose gabapentin 100 mg 3 times daily--may need to consider backing off he remains lethargic -9/10 lower back pain today.  Will schedule Tylenol, do not know if he remembers to ask for it. -9/13 continue scheduled Tylenol and current as needed medications -9/16 patient refusing Voltaren gel, Tylenol, lidocaine patch.  Will discontinue lidocaine patch and change Tylenol and Voltaren gel to as needed   4. Mood/Behavior/Sleep/delirium: LCSW to evaluate and provide emotional  support             -continue trazodone 50mg  q HS             -antipsychotic agents: reduce Zyprexa to 2.5 mg daily (already got 9/9 dose) -9/9 no new scheduled meds at bedtime currently   -will change timing of trazodone to 2100  Neuropsych to speak with patient 5. Neuropsych/cognition: This patient is not quite capable of making decisions on his own behalf.   6. Skin/Wound Care: Routine skin care checks   7. Fluids/Electrolytes/Nutrition: Routine Is and Os and follow-up chemistries             -  dys 3 with thin liquids             -SLP eval, cont vitamins   I personally reviewed the patient's labs today.    8: Hypertension: monitor TID and prn (home Cozaar 25 mg daily held)             -continue metoprolol 50 mg BID -9/16 restart cozaar 25 mg daily as blood pressure has been above goal    07/30/2023    1:43 PM 07/30/2023    4:23 AM 07/29/2023    7:22 PM  Vitals with BMI  Systolic 106 149 161  Diastolic 66 72 77  Pulse 80 84 91       9: Hyperlipidemia: statin held (follow-up LFTs and restart atorvastatin 40 mg as appropriate)   10: Hypothyroidism: continue Synthroid daily   11: Acute cholecystitis s/p cholecystostomy tube             -routine drain care. Site is fine             -continue Levaquin 250 mg daily -9/13 patient asked about timing of possible cholecystectomy surgery.  Advised he will need to discuss this outpatient with general surgery follow-up   12: DM-2: CBGs QID; A1c = 8.8%; on insulin at home             -continue SSI  -9/6 CBGs little higher today, continue to monitor trend for now -07/22/23 CBGs a little worse today, looks like he was on toujeo 54U nightly before admission and is now eating better; will restart semglee at 5U nightly to start-- verified with pharmacy that this would be an ok start.   -9/17 increase semglee to 7u CBG (last 3)  Recent Labs    07/29/23 2106 07/30/23 0552 07/30/23 1235  GLUCAP 195* 123* 287*      13: Hx of CAD  s/p CABG 2016: on Plavix, asa, BB; ? resume statin   14: CKD IIIa: baseline Cr ~1.3             -follow-up BMP  -9/3--Cr 1.54, near baseline---no changes  -encourage appropriate fluids  -9/9 BUN/ CR trending up. ---push fluids  -recheck bmet tomorrow  -might need IVF -9/10 creatinine 1.55 although BUN up to 26.  Will add some IVF normal saline at night -9/16 discontinue normal saline and encourage oral fluid intake, creatinine and BUN 1.2/15 today, recheck in a couple days  15: GI prophylaxis: continue Protonix 40mg  daily   16.  Bowel and bladder incontinence.    PVRs. -last Bms noted to be smears on 9/2 and 9/1, schedule miralax for constipation (see below) -9/13 intermittent incontinence of bladder, will check additional PVRs and IC greater than 350 -07/28/23 PVR 134 once, no others documented, monitor  17. R knee pain  -Start voltaren gel, consider xray if not improved by tomorrow  18. Malnutrition, severe  -9/5 RD following, appreciate assistance, continue to encourage PO intake   -Family plans to bring more outside food to help stimulate his appetite  -9/13 will do calorie count  19. Constipation: meds: miralax BID, senokot S 2 tabs daily  -9/5 continue miralax increase to BID, Check ABD xray  -9/6 moderate stool burden, will order sorbitol x1 -9/9 still no bm since 9/5 (smears)  -sorbitol 60cc today  -SSE if needed this afternoon -9/12 patient has been declining MiraLAx -9/13 sorbitol 45 mL ordered -07/28/23 no BM since 9/9, took miralax yesterday and this morning; will do another sorbitol 60ml  now since that worked last time, if no BM by tomorrow then may consider dulcolax/enema.  -07/29/23 still no BM, pt very irritated and not very agreeable; will see if he will accept dulcolax, already declined enema so I doubt he'll agree to that; will have nursing try to get dulcolax in today.   -9/16 will check KUB to assess stool burden   Will order 1/2 container bowel  prep    LOS: 16 days A FACE TO FACE EVALUATION WAS PERFORMED  Fanny Dance 07/30/2023, 1:38 PM

## 2023-07-30 NOTE — Plan of Care (Signed)
  Problem: RH Stairs Goal: LTG Patient will ambulate up and down stairs w/assist (PT) Description: LTG: Patient will ambulate up and down # of stairs with assistance (PT) Outcome: Not Applicable Note: Downgraded due to pt progress and ramp installation   Problem: RH Ambulation Goal: LTG Patient will ambulate in controlled environment (PT) Description: LTG: Patient will ambulate in a controlled environment, # of feet with assistance (PT). 07/30/2023 1220 by Edwin Cap, PT Note:  Downgraded due to pt progress and shifting focus towards independence with seated mobility  07/30/2023 1216 by Edwin Cap, PT Flowsheets (Taken 07/30/2023 1216) LTG: Pt will ambulate in controlled environ  assist needed:: Minimal Assistance - Patient > 75% LTG: Ambulation distance in controlled environment: 63' Note: Downgraded due to pt progress and shifting focus towards independence with seated mobility Goal: LTG Patient will ambulate in home environment (PT) Description: LTG: Patient will ambulate in home environment, # of feet with assistance (PT). 07/30/2023 1220 by Edwin Cap, PT Note:  Downgraded due to pt progress and shifting focus towards independence with seated mobility  07/30/2023 1216 by Edwin Cap, PT Flowsheets (Taken 07/30/2023 1216) LTG: Pt will ambulate in home environ  assist needed:: Contact Guard/Touching assist LTG: Ambulation distance in home environment: 25' Note: Downgraded due to pt progress and shifting focus towards independence with seated mobility   Problem: RH Wheelchair Mobility Goal: LTG Patient will propel w/c in controlled environment (PT) Description: LTG: Patient will propel wheelchair in controlled environment, # of feet with assist (PT) Flowsheets (Taken 07/30/2023 1221) LTG: Pt will propel w/c in controlled environ  assist needed:: Supervision/Verbal cueing LTG: Propel w/c distance in controlled environment: 150

## 2023-07-30 NOTE — Progress Notes (Signed)
Speech Language Pathology Weekly Progress and Session Note  Patient Details  Name: AIRES LEM MRN: [id number] Date of Birth: [birth year]  Beginning of progress report period: July 23, 2023 End of progress report period: July 30, 2023  Today's Date: 07/30/2023 SLP Individual Time: 1914-7829 SLP Individual Time Calculation (min): 21 min  Short Term Goals: Week 2: SLP Short Term Goal 1 (Week 2): Patient will utilize external aids to orient to time with Min verbal cues. SLP Short Term Goal 1 - Progress (Week 2): Met SLP Short Term Goal 2 (Week 2): Patient will demonstrate functional problem solving for basic and familiar tasks with Min verbal cues. SLP Short Term Goal 2 - Progress (Week 2): Not met SLP Short Term Goal 3 (Week 2): Patient will utilize memory compensatory strategies to recall information during structured tasks with 75% accuracy and Min verbal and visual cues. SLP Short Term Goal 3 - Progress (Week 2): Met SLP Short Term Goal 4 (Week 2): Patient will demonstrate selective attention in a mildly distracting enviornment to a functional task for 15 minutes with supervision. SLP Short Term Goal 4 - Progress (Week 2): Not met    New Short Term Goals: Week 3: SLP Short Term Goal 1 (Week 3): STGs=LTGs d/t ELOS  Weekly Progress Updates: Patient is making moderate progress towards therapy goals, meeting 2/4 short term goals set this reporting period. Patient currently requires minA to attend to functional tasks for 15 minutes and minA to utilize memory compensatory strategies to recall daily information. Given basic functional problem solving tasks, patient requires modA overall but improving to min and supervision depending on relevance, repetition, and task complexity. Patient utilizes external aids to orient to time with min verbal cues. Patient and family education ongoing. Patient will continue to benefit from skilled therapy services during remainder of CIR stay.     Intensity: Minumum of 1-2 x/day, 30 to 90 minutes Frequency: 1 to 3 out of 7 days Duration/Length of Stay: 9/21 Treatment/Interventions: Cognitive remediation/compensation;Environmental controls;Internal/external aids;Therapeutic Activities;Patient/family education;Functional tasks;Cueing hierarchy  Daily Session  Skilled Therapeutic Interventions: SLP conducted skilled therapy session targeting cognitive retraining goals. SLP attempted to start session as scheduled, however patient was off the floor for imaging, thus SLP returned at 1507 to facilitate cognitive retraining tasks. Patient utilized daily schedule to discuss prior events and patient benefited from minA to locate pertinent answers to SLP questions. Patient oriented to date utilizing room calendar with supervision-minA and answered basic temporal problem solving questions with supervisionA. Patient recalled what he had for lunch with supervisionA. Throughout session, he required minA to attend to tasks. Patient was left in lowered bed with call bell in reach and bed alarm set. SLP will continue to target goals per plan of care.       General    Pain Pain Assessment Pain Scale: 0-10 Pain Score: 0-No pain  Therapy/Group: Individual Therapy  Jeannie Done, M.A., CCC-SLP  [name] 07/30/2023, 4:05 PM

## 2023-07-30 NOTE — Progress Notes (Signed)
Occupational Therapy Session Note  Patient Details  Name: Hunter Sparks MRN: [id number] Date of Birth: January 30, 1946  {CHL IP REHAB OT TIME CALCULATIONS:[id number]}   Short Term Goals: Week 2:  OT Short Term Goal 1 (Week 2): Patient will stand and free one hand at a time to help manage pants up or down during toileting OT Short Term Goal 2 (Week 2): Patient will identify need to void, and trasnfer to commode with min assist OT Short Term Goal 3 (Week 2): Pt will maintain dynamic standing balance with min A for 2 minutes in preparation for BADL tasks  Skilled Therapeutic Interventions/Progress Updates:    Patient agreeable to participate in OT session. Reports *** pain level.   Patient participated in skilled OT session focusing on ***. Therapist facilitated/assessed/developed/educated/integrated/elicited *** in order to improve/facilitate/promote    Therapy Documentation Precautions:  Precautions Precautions: Fall Precaution Comments: left hemiparesis, pusher Restrictions Weight Bearing Restrictions: No Other Position/Activity Restrictions: Rt sided percutaneous drain  Therapy/Group: Individual Therapy  Hunter Sparks, OTR/L,CBIS  Supplemental OT - [facility] and [facility] Secure Chat Preferred   07/30/2023, 7:33 PM

## 2023-07-30 NOTE — Progress Notes (Signed)
Calorie Count Note  48-hour calorie count ordered 9/14.   RD went to collect meal tickets and only 1 present in envelope since envelope was hung on Friday. Unable to provide any insight into how pt is eating as only % meal consumed is listed in flowsheet which is not reflective of which items were consumed. Will estimate - but likely inaccurate. Even using flowsheet data it does not appear that pt is meeting his nutrition needs.  Diet: Regular Supplements: Ensure Enlive BID  Estimated Nutritional Needs:  Kcal:  2100-2500 kcals Protein:  105-125 gm Fluid:  >/= 2.1 L  9/13 Dinner: No intake recorded in flowsheet 9/14 Breakfast: 70% intake per flowsheet ~399kcal and 15g protein 9/14 Lunch: 140 kcal, 2g protein (only ticket available)  Total intake: 539 kcal (26% of minimum estimated needs)  17 protein (16% of minimum estimated needs)  9/14 Dinner: 75% per flowsheet (518 kcal, 17g protein) 9/15 Breakfast 10% per flowsheet (62 kcal, 3g protein) 9/15 Lunch no intake recorded  Total intake: 508 kcal (28% of minimum estimated needs)  20 protein (19% of minimum estimated needs)  NUTRITION DIAGNOSIS:  Severe Malnutrition related to acute illness as evidenced by moderate muscle depletion, energy intake < or equal to 50% for > or equal to 5 days. - ongoing  GOAL:  Patient will meet greater than or equal to 90% of their needs - Not met.   - INTERVENTION:  -Continue regular diet - Continue MVI.    Greig Castilla, RD, LDN Clinical Dietitian RD pager # available in [facility]  After hours/weekend pager # available in [facility]

## 2023-07-30 NOTE — Progress Notes (Signed)
Pt refused Miralax. Stated he will not do anything he doesn't want to do. Care ongoing   Rito Ehrlich, LPN

## 2023-07-30 NOTE — Progress Notes (Signed)
Occupational Therapy Session Note  Patient Details  Name: Hunter Sparks MRN: [id number] Date of Birth: Jun 18, 1946  Today's Date: 07/30/2023 OT Individual Time: 1100-1155 OT Individual Time Calculation (min): 55 min    Short Term Goals: Week 2:  OT Short Term Goal 1 (Week 2): Patient will stand and free one hand at a time to help manage pants up or down during toileting OT Short Term Goal 2 (Week 2): Patient will identify need to void, and trasnfer to commode with min assist OT Short Term Goal 3 (Week 2): Pt will maintain dynamic standing balance with min A for 2 minutes in preparation for BADL tasks  Skilled Therapeutic Interventions/Progress Updates:    Pt resting in w/c upon arrival, belt alarm secure, telesitter present, and wife present. OT intervention with focus on BLE/BUE therex for general conditioning and endurance, standing balance, functional transfers, and safety awareness to increase independence with BADLs. NuStep 7 mins level 6 with BLE/BUE and 5 mins level 3 BLE only with rest break. Standing tasks to place and remove clothes pins from basketball net. Sit<>stand and standing balance with CGA. Transfers with CGA and min verbal cues for safety (pt attempted to sit before positioned correctly at seat. Pt returned to room. Belt alarm secure. Telesitter present, Wife present. as  Therapy Documentation Precautions:  Precautions Precautions: Fall Precaution Comments: left hemiparesis, pusher Restrictions Weight Bearing Restrictions: No Other Position/Activity Restrictions: Rt sided percutaneous drain   Pain:  Pt denies pain this morning    Therapy/Group: Individual Therapy  Rich Brave 07/30/2023, 12:01 PM

## 2023-07-30 NOTE — Progress Notes (Signed)
Physical Therapy Weekly Progress Note  Patient Details  Name: DIN ALHASSAN MRN: [id number] Date of Birth: 1946/07/06  Beginning of progress report period: July 23, 2023 End of progress report period: August 01, 2023  Today's Date: 07/30/2023 PT Individual Time: 8469-6295 PT Individual Time Calculation (min): 57 min    Patient has met 4 of 4 short term goals.  Pt is making steady progress towards functional goals. Pt is currently min assist for sit<>stand with RW, min/mod assist for stand pivot transfers, min/mod assist for ambulation up to 100' with chair follow. Pt lethargy has improved this week however pt does continue to be limited by intermittent agitation and decreased insight into deficits.  Patient continues to demonstrate the following deficits muscle weakness, decreased cardiorespiratoy endurance, impaired timing and sequencing, decreased coordination, and decreased motor planning, decreased midline orientation and decreased attention to left, decreased attention, decreased awareness, decreased problem solving, decreased safety awareness, decreased memory, and delayed processing, and decreased standing balance, decreased postural control, hemiplegia, and decreased balance strategies and therefore will continue to benefit from skilled PT intervention to increase functional independence with mobility.  Patient not progressing toward long term goals.  See goal revision..  Plan of care revisions: downgraded ambulation goals for distance and assist needed, added WC mobility goal, and discontinued stair goal.  PT Short Term Goals Week 2:  PT Short Term Goal 1 (Week 2): Pt will complete bed<>WC transfer with mod assist and LRAD PT Short Term Goal 1 - Progress (Week 2): Met PT Short Term Goal 2 (Week 2): Pt will ambulate with RW 61' with mod assist x1 PT Short Term Goal 2 - Progress (Week 2): Met PT Short Term Goal 3 (Week 2): Pt will complete WC mobility min assist 50' PT Short  Term Goal 3 - Progress (Week 2): Met PT Short Term Goal 4 (Week 2): Pt will maintain standing balance with min assist x1 and LRAD PT Short Term Goal 4 - Progress (Week 2): Met Week 3:  PT Short Term Goal 1 (Week 3): STG = LTG due to ELOS  Skilled Therapeutic Interventions/Progress Updates:    Pt presents in room in bed, asleep, awakens with increased time. Pt demonstrating increased agitation and perseveration on nurses wanting to "dope me up" throughout session. Therapist provides gentle redirection and education on medication. Pt also reporting hunger during session reporting that he "hasn't eaten in three days," therapist offered pt to eat breakfast during session however pt refuses. Pt agreeable to eat goldfish crackers during session, provided with extended seated rest break to allow. Session focused on therapeutic education to facilitate overall health and progress as well as gait training to promote tolerance to upright and dynamic postural stability.  Pt completes bed mobility with supervision and frequent verbal cues for redirection and attention to task. Pt then completes stand pivot transfer mod assist without assistive device, cues for safety. Pt then transported to day room dependently for time management.  Pt ambulates 100' with RW min/mod assist with fatigue, max verbal cues for LLE foot placement and 2nd person WC follow due to pt fatigue. Pt resistant to verbal cues for correcting upright posture and gait mechanics, demonstrates decreased insight into deficits. Pt requires extended seated rest break for eating goldfish crackers to promote improved energy and quality with task with attempting to engage pt in therapeutic environment listening to music that he enjoys to decrease agitation and improve therapeutic alliance. Pt continues to demonstrate resistance to education and verbal cues for correcting  due to poor insight into deficits.  Pt completes ambulation 30' with RW min/mod assist  and WC follow with pt demonstrating LOB to L with decreased assist from therapist, completed to promote self awareness into midline orientation with pt requesting to sit to [facility], continues to demonstrate decreased awareness.  Pt then completes ambulation 89' with RW min/mod assist, therapist providing mod/max verbal cues for LLE foot placement, two turns to return to sitting in [facility] requiring max verbal cues for foot placement and turning completely to [facility] prior to sitting.  Pt self propels WC 80' with RLE BUEs, verbal cues to promote large propulsion arc with pt demonstrating small short pushes with BUEs.  Pt returns to room and remains seated in [facility] with all needs within reach, cal light in place and chair alarm donned and activated at end of session.  Therapy Documentation Precautions:  Precautions Precautions: Fall Precaution Comments: left hemiparesis, pusher Restrictions Weight Bearing Restrictions: No Other Position/Activity Restrictions: Rt sided percutaneous drain   Therapy/Group: Individual Therapy  Edwin Cap PT, DPT 07/30/2023, 9:06 AM

## 2023-07-31 ENCOUNTER — Inpatient Hospital Stay (HOSPITAL_COMMUNITY): Payer: PPO

## 2023-07-31 DIAGNOSIS — F02B18 Dementia in other diseases classified elsewhere, moderate, with other behavioral disturbance: Secondary | ICD-10-CM

## 2023-07-31 DIAGNOSIS — K819 Cholecystitis, unspecified: Secondary | ICD-10-CM

## 2023-07-31 HISTORY — PX: IR EXCHANGE BILIARY DRAIN: IMG6046

## 2023-07-31 LAB — GLUCOSE, CAPILLARY
Glucose-Capillary: 128 mg/dL — ABNORMAL HIGH (ref 70–99)
Glucose-Capillary: 207 mg/dL — ABNORMAL HIGH (ref 70–99)
Glucose-Capillary: 140 mg/dL — ABNORMAL HIGH (ref 70–99)
Glucose-Capillary: 246 mg/dL — ABNORMAL HIGH (ref 70–99)

## 2023-07-31 MED ORDER — LIDOCAINE-EPINEPHRINE 1 %-1:100000 IJ SOLN
INTRAMUSCULAR | Status: AC
  Filled 2023-07-31: qty 1

## 2023-07-31 MED ORDER — MEGESTROL ACETATE 400 MG/10ML PO SUSP
400.0000 mg | Freq: Two times a day (BID) | ORAL | Status: DC
  Administered 2023-07-31 – 2023-08-03 (×5): 400 mg via ORAL
  Filled 2023-07-31 (×9): qty 10

## 2023-07-31 MED ORDER — LIDOCAINE-EPINEPHRINE 1 %-1:100000 IJ SOLN
20.0000 mL | Freq: Once | INTRAMUSCULAR | Status: AC
  Administered 2023-07-31: 10 mL via INTRADERMAL
  Filled 2023-07-31: qty 20

## 2023-07-31 MED ORDER — IOHEXOL 300 MG/ML  SOLN
50.0000 mL | Freq: Once | INTRAMUSCULAR | Status: AC | PRN
  Administered 2023-07-31: 25 mL

## 2023-07-31 NOTE — Progress Notes (Signed)
Speech Language Pathology Daily Session Note  Patient Details  Name: Hunter Sparks MRN: [id number] Date of Birth: [birth year]  Today's Date: 07/31/2023 SLP Individual Time: 1430-1500 SLP Individual Time Calculation (min): 30 min  Short Term Goals: Week 3: SLP Short Term Goal 1 (Week 3): STGs=LTGs d/t ELOS  Skilled Therapeutic Interventions:  Pt was seen in PM to address cognitive re- training. Pt was alert and seated upright in WC upon SLP arrival. Pt reports pain in his leg 2/2 arthritis. He was agreeable for session. SLP challenging pt in problem solving this date. Given daily living and medical scenarios presented verbally pt challenged to identify appropriate solutions. Pt identified appropriate solutions in 67% of opportunities with mod A. Pt overall, presented with limited awareness of deficits and medical recommendations. In missed opportunities, SLP provided correct response and rationale. However, pt frequently responded, "I'll do whatever I want. " Pt left seated upright in WC with chair alarm active and call button within reach. SLP to continue POC.   Pain Pain Assessment Pain Scale: 0-10 Pain Score: 6  Pain Type: Chronic pain Pain Location: Leg Pain Orientation: Right  Therapy/Group: Individual Therapy  Renaee Munda 07/31/2023, 3:28 PM

## 2023-07-31 NOTE — Consult Note (Signed)
Neuropsychological Consultation Comprehensive Inpatient Rehab   Patient:   Hunter Sparks   DOB:   04-Mar-1946  MR Number:  [id number]  Location:  [facility] [address] CENTER A [address] [place] [address] Dept: [phone] Loc: [phone]           Date of Service:   07/30/2023  Start Time:   10 AM End Time:   11 AM  Provider/Observer:  Arley Phenix, Psy.D.       Clinical Neuropsychologist       Billing Code/Service: [phone]  Reason for Service:    Hunter Sparks is a 77 year old male referred for neuropsychological consultation during his ongoing admission onto the comprehensive inpatient rehabilitation unit.  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  Patient was subsequently admitted to CIR on 8/10 but on 07/02/2023 showed new changes in cognitive functioning with nausea/vomiting and right upper quadrant dysfunction.  Patient had a low-grade fever.  Workup suggested infection.  Patient was discharged off the unit for medical care and then returned to the unit to complete therapeutic interventions due to ongoing dysfunction following watershed infarct impacting the right basal ganglia, frontal, parietal and occipital lobes.  Patient has had times with frustration and noncompliance or refusing various interventions at times including taking his insulin.  He has had confusion and agitation but has been showing progressive improvements and will be discharged in the next couple of days.  During today's intervention, we discussed some of his frustrations and the patient readily admits that he has had a great deal of confusion.  We talked about the episodes of encephalopathic functioning and how medical illness can really confuse things on top of his recent watershed stroke.  The patient is  improving but continues with significant cognitive deficit due to cerebrovascular changes.  HPI for the current admission:    HPI: Hunter Sparks is a 77 year old male who initially presented on 06/21/2023 with left-sided weakness and imaging c/w with 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 Placed on Plavix and aspirin for 3 weeks beginning 8/09. Echo with ~70-75% EF and grade 1 diastolic dysfunction. Admitted to CIR on 8/10. On 07/02/2023 had findings of some cognitive changes with nausea vomiting right upper quadrant discomfort. Low-grade fever 99.1 WBC 16,100 lactic acid elevated 2.9 with follow-up WBC of 22,500. Transferred to acute TRH service. CT c/w acute cholecystitis. Antibiotics continue. General surgery and IR consulted for cholecystostomy drain and this was placed 8/20. Operative cultures positive for Klebsiella, patient treated with IV Rocephin. Hospital stay has been complicated by poor p.o. intakes, ongoing encephalopathy, type 2 diabetes, AKI on maintenance fluids, and abdominal and back pain on oxycodone and tramadol due to Dilaudid causing hypotension. Percutaneous cholecystostomy drain to remain in place at least 6 weeks. Recommend fluoroscopy with injection of the drain in IR to evaluate for patency of the cystic duct. IR will follow up with the patient in approximately 6 weeks. A scheduler from their office will call the patient with a date/time of his appointment. Palliative care consulted on 8/25 for goals of care. Follow-up with patient and spouse on 8/26 and patient has decided on DNR/DNI. Sepsis resolved. Statin on hold per family request per Dr. Benjamine Mola. CMP with persistent mild elevation of AST. There was consideration to placing Cortrak due to poor PO intake, however notes indicate he is eating  better last 24 hours. Reviewing Is and Os, there is no PO intake documented back to 8/26. 8/29: Worked on sit>stand from recliner to RW, needed max A +2, esp on L side. After several trials  pt was more fatigued and could not progress to ambulation. The patient requires inpatient medicine and rehabilitation evaluations and services for ongoing dysfunction secondary to scattered small infarct right basal ganglia, frontal, parietal, occipital lobes-MCA/PCA watershed distribution.   Medical History:   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HCC)    Arthritis    "back, hips, legs" (06/10/2015)   Chest pain    Chronic lower back pain    Depression    DJD (degenerative joint disease)    Hyperlipidemia 06/11/2015   Hypertension    Hypothyroidism    Kidney stone    Kidney stone    Type II diabetes mellitus (HCC)          Patient Active Problem List   Diagnosis Date Noted   Urinary incontinence 07/27/2023   Acute CVA (cerebrovascular accident) (HCC) 07/14/2023   Protein-calorie malnutrition, severe 07/07/2023   Cholecystitis 07/03/2023   Moderate cognitive impairment 07/02/2023   Acute abdominal pain 07/02/2023   Sepsis with acute organ dysfunction without septic shock (HCC) 07/02/2023   Hemiparesis affecting left side as late effect of cerebrovascular accident (CVA) (HCC) 07/02/2023   Cerebral ischemic stroke due to global hypoperfusion with watershed infarct [facility]) 06/23/2023   Stroke (cerebrum) (HCC) 06/22/2023   Left-sided weakness 06/21/2023   CKD stage 3a, GFR 45-59 ml/Hunter (HCC) - Baseline scr 1.3-1.5 06/21/2023   Major depressive disorder 08/08/2021   Hx of colonic polyps 06/13/2017   Family hx of colon cancer 06/13/2017   Vitamin D insufficiency 01/04/2017   Insulin dependent type 2 diabetes mellitus (HCC) 09/01/2015   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 his dress was Appropriate and he was Well Groomed and his manners were Appropriate, inappropriate to the situation.  his participation was indicative of Inattentive and Resistant behaviors.  There were physical disabilities noted.  he displayed an inappropriate level of cooperation and motivation.    Interactions:    Active Inattentive and Redirectable  Attention:   abnormal and attention span appeared shorter than expected for age  Memory:   abnormal; remote memory intact, recent memory impaired  Visuo-spatial:   not examined  Speech (Volume):  low  Speech:   normal; slurred  Thought Process:  Circumstantial and Tangential  Concrete and Distracted  Though Content:  Rumination; not suicidal and not homicidal  Orientation:   person, place, and situation  Judgment:   Poor  Planning:   Poor  Affect:    Irritable  Mood:    Dysphoric  Insight:   Shallow  Intelligence:   normal  Psychiatric History:  Patient has a past history of major depressive disorder but the only psychotropic interventions besides medications for sleep including trazodone and a brief use of Haldol due to acute agitations was a brief intervention with Xanax in 2016.  Family  Med/Psych History:  Family History  Problem Relation Age of Onset   Stroke Mother    Hypertension Mother    Stomach cancer Father    Liver cancer Father    Cancer Father    Rectal cancer Brother    Cancer Brother    Heart attack Neg Hx     Impression/DX:   Hunter Sparks is a 77 year old male referred for neuropsychological consultation during his ongoing admission onto the comprehensive inpatient rehabilitation unit.  The patient had initially presented to the emergency department on 06/21/2023 with left-sided weakness and neuroimaging indicating acute right MCA and PCA/MCA watershed  infarcts.  Patient was subsequently admitted to CIR on 8/10 but on 07/02/2023 showed new changes in cognitive functioning with nausea/vomiting and right upper quadrant dysfunction.  Patient had a low-grade fever.  Workup suggested infection.  Patient was discharged off the unit for medical care and then returned to the unit to complete therapeutic interventions due to ongoing dysfunction following watershed infarct impacting the right basal ganglia, frontal, parietal and occipital lobes.  Patient has had times with frustration and noncompliance or refusing various interventions at times including taking his insulin.  He has had confusion and agitation but has been showing progressive improvements and will be discharged in the next couple of days.  During today's intervention, we discussed some of his frustrations and the patient readily admits that he has had a great deal of confusion.  We talked about the episodes of encephalopathic functioning and how medical illness can really confuse things on top of his recent watershed stroke.  The patient is improving but continues with significant cognitive deficit due to cerebrovascular changes.  Disposition/Plan:  Today we worked on coping and adjustment issues in particular and addressing some of the confusion and disjointed memories and stressors that he had during his acute illness.  Patient continues with ongoing cognitive deficits particular for memory, executive functioning and attention.          Electronically Signed   _______________________ Arley Phenix, Psy.D. Clinical Neuropsychologist

## 2023-07-31 NOTE — Progress Notes (Signed)
Occupational Therapy Session Note  Patient Details  Name: Hunter Sparks MRN: [id number] Date of Birth: 28-Aug-1946  {CHL IP REHAB OT TIME CALCULATIONS:[id number]}   Short Term Goals: Week 2:  OT Short Term Goal 1 (Week 2): Patient will stand and free one hand at a time to help manage pants up or down during toileting OT Short Term Goal 2 (Week 2): Patient will identify need to void, and trasnfer to commode with min assist OT Short Term Goal 3 (Week 2): Pt will maintain dynamic standing balance with min A for 2 minutes in preparation for BADL tasks  Skilled Therapeutic Interventions/Progress Updates:     Completed family training with ***. Therapist educated family regarding ***. Education also provided on strategies and compensatory techniques to use if ***. Education provided for gait belt use and handling technique. *** completed hands on training for *** and *** transfer with therapist providing VC as needed for technique and form. All education completed and all questions    Therapy Documentation Precautions:  Precautions Precautions: Fall Precaution Comments: left hemiparesis, pusher Restrictions Weight Bearing Restrictions: No Other Position/Activity Restrictions: Rt sided percutaneous drain  Therapy/Group: Individual Therapy  Limmie Patricia, OTR/L,CBIS  Supplemental OT - [facility] and [facility] Secure Chat Preferred   07/31/2023, 5:03 PM

## 2023-07-31 NOTE — Progress Notes (Signed)
Physical Therapy Session Note  Patient Details  Name: Hunter Sparks MRN: [id number] Date of Birth: 12/28/1945  Today's Date: 07/31/2023 PT Individual Time: 4098-1191 PT Individual Time Calculation (min): 15 min  and Today's Date: 07/31/2023 PT Missed Time: 15 Minutes Missed Time Reason: CT/MRI  Short Term Goals: Week 3:  PT Short Term Goal 1 (Week 3): STG = LTG due to ELOS  Skilled Therapeutic Interventions/Progress Updates:     Pt received seated in [facility] and agrees to therapy. No complaint of pain. WC transport to gym for time management. Pt performs squat pivot transfer to mat table with minA and cues for initiation and positioning. RN arrives and reports that IR is on unit to take pt down for imaging. Pt performs sit to stand with minA, then ambulates from dayroom back to room, x150', without AD and with modA overall, with manual cueing at trunk for stability, as well as facilitation of lateral weight shifting and increasing Lt stride length and step height. Pt performs supien to sit with cues for positioning and sequencing. Left supine in bed. Pt misses 15 minutes of skilled PT due to being off floor for procedure.   Therapy Documentation Precautions:  Precautions Precautions: Fall Precaution Comments: left hemiparesis, pusher Restrictions Weight Bearing Restrictions: No Other Position/Activity Restrictions: Rt sided percutaneous drain   Therapy/Group: Individual Therapy  Beau Fanny, PT, DPT 07/31/2023, 3:30 PM

## 2023-07-31 NOTE — Patient Care Conference (Signed)
Inpatient RehabilitationTeam Conference and Plan of Care Update Date: 07/31/2023   Time: 10:54 AM   Patient Name: Hunter Sparks      Medical Record Number: [id number]  Date of Birth: [birth year] Sex: Male         Room/Bed: 4W20C/4W20C-01 Payor Info: Payor: [employer] / Plan: [employer] PPO / Product Type: *No Product type* /    Admit Date/Time:  07/14/2023  5:03 PM  Primary Diagnosis:  Acute CVA (cerebrovascular accident) [facility])  Hospital Problems: Principal Problem:   Acute CVA (cerebrovascular accident) [facility]) Active Problems:   Urinary incontinence   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 Faith Rogue Social Worker Present: Cecile Sheerer, LCSWA Nurse Present: Vedia Pereyra, RN PT Present: Darrold Span, PT OT Present: Roney Mans, OT SLP Present: Feliberto Gottron, SLP PPS Coordinator present : Fae Pippin, SLP     Current Status/Progress Goal Weekly Team Focus  Bowel/Bladder   Pt can be continent at times, but have many incontinent episodes. LBM: 9/9 When asked when was the last time he had a BM, he stated last week. I encouraged him that he needs to take his stool softeners and maybe a laxative to help his bowels to move, but pt states that he will have a BM when he gets home. Pt refused taking miralax.   To develop a better bowel pattern that is more consistent.   Assist w/ toileting needs q 2-4 hours and as needed.    Swallow/Nutrition/ Hydration             ADL's   Min A for sit to stand with RW (from low level surface) or CGA from higher level surface. SBA: UB and LB bathing, UB dressing. Min A: LB dressing (incontinent brief only)   SBA-Min A   family education/training to review transfers, ADL performance. Sit to stand transitions; specifically  from lower surface, standing balance    Mobility   supervision bed mob, min/modA transfers with RW and without, gait 100' with RW min/modA and 2nd person WC follow   CGA transfers and short distance ambulation, supervision WC  barriers: lethargy, poor insight into deficits, decreased caregiver assist. Continue working on postural control in standing, gait training, transfer training, LLE NMR, global endurance    Communication              Safety/Cognition/ Behavioral Observations  min- mod A   supervision   participation, awareness, problem solving    Pain   Pt denies pain.   Remain pain free.   Assess pain q shift & PRN.    Skin   Bruising to bilat. abdomen.    Maintain skin integrity.  Assess skin q shift & PRN.      Discharge Planning:  D/c remains home with wife who will be primary caregiver. Wife is unable to provide any physical assistance due to COPD. She would like him to be able to ambulate with an AD. Some PRN support from their son. Wife intends to have the ramp in place, walk-in shower, installing raised toilet seat, and grab bar by the toilet. [name], and next session on 9/18 9am-12pm. [facility] for HHPT/OT/SLP/aide/SN. SW will confirm there are no barriers to discharge.   Team Discussion: Acute CVA. Tell-sitter for safety. Time toileting. Constipation remains an issue. No bowel  obstruction. Biliary drain not flushing as of yesterday. Blood sugars continue to be elevated. Tolerating regular diet.  AC/HS.  Patient on target to meet rehab goals: For the most part patient is meeting goals but will have to downgrade a couple of goals with discharge 08/04/23  *See Care Plan and progress notes for long and short-term goals.   Revisions to Treatment Plan:  KUB. Replace biliary drain. Monitor labs and VS Teaching Needs: Medications, safety, self care, gait/transfer training, drain care, etc.   Current Barriers to  Discharge: Decreased caregiver support, Incontinence, and Behavior  Possible Resolutions to Barriers: Family education Regain control of bowel/bladder Able to self monitor behaviors with cueing Order recommended DME      Medical Summary Current Status: cognition better. meds adjusted to help arousal. ongoing biliary drain in place. renal function fluctuates. diabetes poorloy controlled  Barriers to Discharge: Medical stability;Complicated Wound  Barriers to Discharge Comments: CVA, pain, DM2, Acute cholecystitis s/p cholecystostomy tube, malnutrition, constipation, CKD IIIa Possible Resolutions to [employer] Focus: daily assessment of labs, wound/drain, pt data with adjustments to regimen as necessary. weaning sleep/behavioral meds d/t sedation   Continued Need for Acute Rehabilitation Level of Care: The patient requires daily medical management by a physician with specialized training in physical medicine and rehabilitation for the following reasons: Direction of a multidisciplinary physical rehabilitation program to maximize functional independence : Yes Medical management of patient stability for increased activity during participation in an intensive rehabilitation regime.: Yes Analysis of laboratory values and/or radiology reports with any subsequent need for medication adjustment and/or medical intervention. : Yes   I attest that I was present, lead the team conference, and concur with the assessment and plan of the team.   Jearld Adjutant 08/01/2023, 8:40 AM

## 2023-07-31 NOTE — Progress Notes (Signed)
Physical Therapy Session Note  Patient Details  Name: Hunter Sparks MRN: [id number] Date of Birth: 10-17-1946  Today's Date: 07/31/2023 PT Individual Time: 1301-1416 PT Individual Time Calculation (min): 75 min   Short Term Goals: Week 3:  PT Short Term Goal 1 (Week 3): STG = LTG due to ELOS  Skilled Therapeutic Interventions/Progress Updates:    Pt presents in room in recliner, asleep but awakens with verbal cueing. Pt reporting pain from arthritis, premedicated and stating it is improving since medication. Session focused on gait training, transfer training, NMR for postural stability and midline orientation as well as BLE muscle fiber recruitment needed for functional transfers and ambulation.  Pt completes stand pivot transfer with RW heavy min assist to R to WC. Pt transported dependently from room to day room. Pt ambulates 45' with RW min assist and verbal cues for LLE foot clearance and foot placement with fatigue, 2nd person WC follow for safety. Therapist prompted pt in therapeutic discussion about pt motivation and goals to faciliate improved carryover and participation with therapy sessions as well as enhance therapeutic alliance to decrease resistance to verbal cues and improve trust with hospital staff.  Pt reports feeling like his "legs are going to break off," agreeable to NMR for increased muscle fiber recruitment needed for functional transfers and ambulation and decrease pain with mobility. Pt completes ambulatory transfer CGA/min assist 30' from [facility] to kinetron, pt transitions to sitting on kinetron with min assist and mod verbal cues for sequencing to transfer. Pt completes seated hip extension interval training 1 min work/1 min rest 8 minutes total with verbal cues for trunk and L knee positioning to promote improved muscle fiber recruitment and global endurance. Pt then completes standing reciprocal hip extension on kinetron for 1 min, BUE support on kinetron handles, min  assist for stability with mirror positioned in front, mod verbal cues for full ROM on LLE. Pt then completes x10 sit<>stands on kinetron with mirror positioned in front, cues for maintaining equal weight on BLEs for stand.  Pt then completes ambulatory transfer back to [facility] 30' with min assist and mod verbal cues for LLE foot clearance with fatigue.  Pt completes standing NMR with BUE support on RW, LLE step taps 3x10 with mod verbal cues for upright posture and R lateral lean. Pt provided with extended seated rest breaks due to fatigue.  Pt then self propels WC with BUEs 125' with max verbal cues and occasional hand over hand assist to promote improved propulsion arc with pt able to demonstrate briefly but then returns to small pushes.  Pt returns to room and remains seated in [facility] with all needs within reach, cal light in place and chair alarm donned and activated at end of session.   Therapy Documentation Precautions:  Precautions Precautions: Fall Precaution Comments: left hemiparesis, pusher Restrictions Weight Bearing Restrictions: No Other Position/Activity Restrictions: Rt sided percutaneous drain   Therapy/Group: Individual Therapy  Edwin Cap PT, DPT 07/31/2023, 3:23 PM

## 2023-07-31 NOTE — Progress Notes (Addendum)
Asked to evaluate perc chole due to unable to flush.  I attempted to flush catheter however it is clogged, likely from debris.  Will exchange today. Order placed.   ___________________________________________________________   Percutaneous cholecystostomy drain to remain in place at least 6 weeks.   Recommend fluoroscopy with injection of the drain in IR to evaluate for patency of the cystic duct.  If the duct is patent and general surgery feels patient is stable for cholecystectomy, the drain would be removed at time of surgery.  If the duct is patent and general surgery feels patient is NEVER a candidate for cholecystectomy, drain can be capped for a trial.  If symptoms recur, then place to gravity bag again.  If trial is successful, discuss possible removal of the drain.  If trial in unsuccessful, then patient will need routine exchanges of the  chole tube about every 8-10 weeks.  There is already an order in place for follow up drain injection. IR clinic will call patient with appointment details.   Jerry Caras Analie Katzman PA-C 07/31/2023 12:26 PM

## 2023-07-31 NOTE — Progress Notes (Addendum)
PROGRESS NOTE   Subjective/Complaints:  Patient refused MiraLAX yesterday. IR consult placed as nursing had trouble flushing cholecystostomy tube, they plan to exchange today.  ROS: Limited due to cognitive/behavioral   Objective:   DG Abd 2 Views  Result Date: 07/30/2023 CLINICAL DATA:  Constipation EXAM: ABDOMEN - 2 VIEW COMPARISON:  07/19/2023 FINDINGS: Supine and upright frontal views of the abdomen and pelvis are obtained. Percutaneous cholecystostomy tube overlies right upper quadrant, stable. No bowel obstruction or ileus. No significant fecal retention. There are no abdominal masses. 6 mm left renal calculus. No free gas within the greater peritoneal sac. Linear metallic foreign bodies within the right lower anterior abdominal wall again noted. IMPRESSION: 1. No bowel obstruction or ileus. 2. Stable percutaneous cholecystostomy tube. 3. Stable 6 mm left renal calculus. Electronically Signed   By: Sharlet Salina M.D.   On: 07/30/2023 18:43   Recent Labs    07/30/23 0645  WBC 8.0  HGB 12.7*  HCT 38.9*  PLT 234     Recent Labs    07/30/23 0645  NA 140  K 3.9  CL 106  CO2 26  GLUCOSE 139*  BUN 15  CREATININE 1.24  CALCIUM 8.9      Intake/Output Summary (Last 24 hours) at 07/31/2023 1054 Last data filed at 07/31/2023 0727 Gross per 24 hour  Intake 360 ml  Output --  Net 360 ml        Physical Exam: Vital Signs Blood pressure 128/74, pulse 94, temperature 98.1 F (36.7 C), resp. rate 18, height 6' (1.829 m), weight 75 kg, SpO2 97%.  Constitutional: No distress . Vital signs reviewed.  Lying in the bed HEENT: NCAT, EOMI, oral membranes moist Neck: supple Cardiovascular: RRR without murmur. No JVD    Respiratory/Chest: CTA Bilaterally without wheezes or rales. Normal effort    GI/Abdomen: BS +, non-tender, non-distended, soft, drain in RUQ area. Scant greenish fluid in drain bag.  Ext: no clubbing,  cyanosis, or edema Psych: flat, calm this morning Skin: C/D/I. No apparent lesions. Drain site intact MSK:      No joint swelling or tenderness noted Neurologic exam:  Cognition: Awake and alert.  Oriented to person, place, Poor insight.  Can perseverate, easily distractible Sensation: To light touch intact in BL UEs and LEs  CN: 2-12 grossly intact.  Coordination:  Bilateral upper extremity ataxia, left greater than right.  Strength: Moving all 4 extremities to gravity and resistance   Assessment/Plan: 1. Functional deficits which require 3+ hours per day of interdisciplinary therapy in a comprehensive inpatient rehab setting. Physiatrist is providing close team supervision and 24 hour management of active medical problems listed below. Physiatrist and rehab team continue to assess barriers to discharge/monitor patient progress toward functional and medical goals  Care Tool:  Bathing  Bathing activity did not occur: Refused Body parts bathed by patient: Right arm, Chest, Left arm, Abdomen, Front perineal area, Buttocks, Right upper leg, Left upper leg, Face   Body parts bathed by helper: Left lower leg, Right lower leg     Bathing assist Assist Level: Minimal Assistance - Patient > 75%     Upper Body Dressing/Undressing Upper body dressing  What is the patient wearing?: Pull over shirt    Upper body assist Assist Level: Minimal Assistance - Patient > 75%    Lower Body Dressing/Undressing Lower body dressing      What is the patient wearing?: Pants     Lower body assist Assist for lower body dressing: Moderate Assistance - Patient 50 - 74%     Toileting Toileting Toileting Activity did not occur (Clothing management and hygiene only): N/A (no void or bm)  Toileting assist Assist for toileting: Minimal Assistance - Patient > 75%     Transfers Chair/bed transfer  Transfers assist     Chair/bed transfer assist level: Moderate Assistance - Patient 50 - 74%      Locomotion Ambulation   Ambulation assist      Assist level: 2 helpers Assistive device: Walker-rolling Max distance: 100'   Walk 10 feet activity   Assist  Walk 10 feet activity did not occur: Safety/medical concerns  Assist level: Minimal Assistance - Patient > 75% Assistive device: Walker-rolling   Walk 50 feet activity   Assist Walk 50 feet with 2 turns activity did not occur: Safety/medical concerns  Assist level: Moderate Assistance - Patient - 50 - 74% Assistive device: Walker-rolling    Walk 150 feet activity   Assist Walk 150 feet activity did not occur: Safety/medical concerns         Walk 10 feet on uneven surface  activity   Assist Walk 10 feet on uneven surfaces activity did not occur: Safety/medical concerns         Wheelchair     Assist Is the patient using a wheelchair?: Yes Type of Wheelchair: Manual    Wheelchair assist level: Moderate Assistance - Patient 50 - 74% Max wheelchair distance: 3'    Wheelchair 50 feet with 2 turns activity    Assist        Assist Level: Moderate Assistance - Patient 50 - 74%   Wheelchair 150 feet activity     Assist      Assist Level: Total Assistance - Patient < 25%   Blood pressure 128/74, pulse 94, temperature 98.1 F (36.7 C), resp. rate 18, height 6' (1.829 m), weight 75 kg, SpO2 97%.  1. 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  Antithrombotics: -DVT/anticoagulation:  Pharmaceutical: Heparin 5000U q8h -antiplatelet therapy: Aspirin and Plavix for three weeks followed by aspirin alone>>last dose of Plavix 8/30.    3. Pain Management: chronic back pain -Tylenol and Robaxin  as needed, Voltaren QID, lidoderm daily             -continue tramadol 50 mg TID (ordered as q6h PRN currently) -Patient reporting chronic pain in multiple areas of his body, continue low-dose gabapentin 100 mg 3 times daily--may need to consider backing off he remains lethargic -9/10 lower back pain today.  Will schedule Tylenol, do not know if he remembers to ask for it. -9/13 continue scheduled Tylenol and current as needed medications -9/16 patient refusing Voltaren gel, Tylenol, lidocaine patch.  Will discontinue lidocaine patch and change Tylenol and Voltaren gel to as needed   4. Mood/Behavior/Sleep/delirium: LCSW to  evaluate and provide emotional support             -continue trazodone 50mg  q HS             -antipsychotic agents: reduce Zyprexa to 2.5 mg daily (already got 9/9 dose) -9/9 no new scheduled meds at bedtime currently   -will change timing of trazodone to 2100  -9/17 seen by neuro psychology for coping and adjustment  5. Neuropsych/cognition: This patient is not quite capable of making decisions on his own behalf.   6. Skin/Wound Care: Routine skin care checks   7. Fluids/Electrolytes/Nutrition: Routine Is and Os and follow-up chemistries             -dys 3 with thin liquids             -SLP eval, cont vitamins   I personally reviewed the patient's labs today.    8: Hypertension: monitor TID and prn (home Cozaar 25 mg daily held)             -continue metoprolol 50 mg BID -9/16 restart cozaar 25 mg daily as blood pressure has been above goal 9/17 BP better controlled today    07/31/2023    6:00 AM 07/30/2023    8:37 PM 07/30/2023    1:43 PM  Vitals with BMI  Systolic 128 125 657  Diastolic 74 65 66  Pulse 94 68 80       9: Hyperlipidemia: statin held (follow-up LFTs and restart atorvastatin 40 mg as appropriate)   10: Hypothyroidism: continue Synthroid daily   11: Acute cholecystitis s/p Cholecystostomy Tube             -routine drain care. Site is  fine             -continue Levaquin 250 mg daily -9/13 patient asked about timing of possible cholecystectomy surgery.  Advised he will need to discuss this outpatient with general surgery follow-up 9/17 IR to exchange tube today   12: DM-2: CBGs QID; A1c = 8.8%; on insulin at home             -continue SSI  -9/6 CBGs little higher today, continue to monitor trend for now -07/22/23 CBGs a little worse today, looks like he was on toujeo 54U nightly before admission and is now eating better; will restart semglee at 5U nightly to start-- verified with pharmacy that this would be an ok start.   -9/16 increase semglee to 7u  9/17 well-controlled today CBG (last 3)  Recent Labs    07/30/23 1647 07/30/23 2122 07/31/23 0602  GLUCAP 161* 130* 128*      13: Hx of CAD s/p CABG 2016: on Plavix, asa, BB; ? resume statin   14: CKD IIIa: baseline Cr ~1.3             -follow-up BMP  -9/3--Cr 1.54, near baseline---no changes  -encourage appropriate fluids  -9/9 BUN/ CR trending up. ---push fluids  -recheck bmet tomorrow  -might need IVF -9/10 creatinine 1.55 although BUN up to 26.  Will add some IVF normal saline at night -9/16 discontinue normal saline and encourage oral fluid intake, creatinine and BUN 1.2/15 today, recheck in a couple days Recheck tomorrow  15: GI prophylaxis: continue Protonix 40mg  daily   16.  Bowel and bladder incontinence.    PVRs. -last Bms noted to be smears on 9/2 and 9/1, schedule miralax for constipation (see below) -9/13 intermittent incontinence of bladder, will check additional PVRs and  IC greater than 350 -07/28/23 PVR 134 once, no others documented, monitor  17. R knee pain  -Start voltaren gel, consider xray if not improved by tomorrow  18. Malnutrition, severe  -9/5 RD following, appreciate assistance, continue to encourage PO intake   -Family plans to bring more outside food to help stimulate his appetite  -9/13 will do calorie count  -9/17 will start  appetite stimulant tomorrow, megace  19. Constipation: meds: miralax BID, senokot S 2 tabs daily  -9/5 continue miralax increase to BID, Check ABD xray  -9/6 moderate stool burden, will order sorbitol x1 -9/9 still no bm since 9/5 (smears)  -sorbitol 60cc today  -SSE if needed this afternoon -9/12 patient has been declining MiraLAx -9/13 sorbitol 45 mL ordered -07/28/23 no BM since 9/9, took miralax yesterday and this morning; will do another sorbitol 60ml now since that worked last time, if no BM by tomorrow then may consider dulcolax/enema.  -07/29/23 still no BM, pt very irritated and not very agreeable; will see if he will accept dulcolax, already declined enema so I doubt he'll agree to that; will have nursing try to get dulcolax in today.   -9/16 will check KUB to assess stool burden   Will order 1/2 container bowel prep-patient declined this, KUB without acute findings    LOS: 17 days A FACE TO FACE EVALUATION WAS PERFORMED  Fanny Dance 07/31/2023, 10:54 AM

## 2023-07-31 NOTE — Progress Notes (Signed)
Patient ID: Hunter Sparks, male   DOB: October 12, 1946, 77 y.o.   MRN: [id number]  SW ordered w/c with [employer] [facility] via parachute.  1512- SW spoke with pt wife [name] to provide updates from team conference, d/c date 9/21, and w/c ordered. Confirms she will be here tomorrow for family edu.   Cecile Sheerer, MSW, LCSWA Office: [phone] Cell: [phone] Fax: [phone]

## 2023-07-31 NOTE — Plan of Care (Signed)
Updated Plan of Care  Goals downgraded given previous progress made up to this point and remaining ELOS:   Problem: RH Cognition - SLP Goal: RH LTG Patient will demonstrate orientation with cues Description:  LTG:  Patient will demonstrate orientation to person/place/time/situation with cues (SLP)   Flowsheets Taken 07/31/2023 1211 by Jeannie Done A, CCC-SLP LTG: Patient will demonstrate orientation using cueing (SLP): Minimal Assistance - Patient > 75% Taken 07/16/2023 1216 by Huston Foley, CCC-SLP LTG Patient will demonstrate orientation to:  Place  Time  Situation   Problem: RH Problem Solving Goal: LTG Patient will demonstrate problem solving for (SLP) Description: LTG:  Patient will demonstrate problem solving for basic/complex daily situations with cues  (SLP) Flowsheets Taken 07/31/2023 1211 by Jeannie Done A, CCC-SLP LTG Patient will demonstrate problem solving for: Moderate Assistance - Patient 50 - 74% Taken 07/16/2023 1216 by Huston Foley, CCC-SLP LTG: Patient will demonstrate problem solving for (SLP): Basic daily situations   Problem: RH Memory Goal: LTG Patient will use memory compensatory aids to (SLP) Description: LTG:  Patient will use memory compensatory aids to recall biographical/new, daily complex information with cues (SLP) Flowsheets (Taken 07/31/2023 1211) LTG: Patient will use memory compensatory aids to (SLP): Moderate Assistance - Patient 50 - 74%   Problem: RH Attention Goal: LTG Patient will demonstrate this level of attention during functional activites (SLP) Description: LTG:  Patient will will demonstrate this level of attention during functional activites (SLP) Flowsheets (Taken 07/31/2023 1211) Patient will demonstrate during cognitive/linguistic activities the attention type of: Sustained LTG: Patient will demonstrate this level of attention during cognitive/linguistic activities with assistance of  (SLP): Minimal Assistance - Patient > 75%

## 2023-08-01 LAB — GLUCOSE, CAPILLARY
Glucose-Capillary: 173 mg/dL — ABNORMAL HIGH (ref 70–99)
Glucose-Capillary: 197 mg/dL — ABNORMAL HIGH (ref 70–99)
Glucose-Capillary: 181 mg/dL — ABNORMAL HIGH (ref 70–99)
Glucose-Capillary: 181 mg/dL — ABNORMAL HIGH (ref 70–99)

## 2023-08-01 MED ORDER — INSULIN GLARGINE-YFGN 100 UNIT/ML ~~LOC~~ SOLN
9.0000 [IU] | Freq: Every day | SUBCUTANEOUS | Status: DC
  Administered 2023-08-01 – 2023-08-03 (×3): 9 [IU] via SUBCUTANEOUS
  Filled 2023-08-01 (×4): qty 0.09

## 2023-08-01 NOTE — Progress Notes (Signed)
Speech Language Pathology Daily Session Note  Patient Details  Name: Hunter Sparks MRN: [id number] Date of Birth: Jul 29, 1946  Today's Date: 08/01/2023 SLP Individual Time: 1478-2956 SLP Individual Time Calculation (min): 28 min  Short Term Goals: Week 3: SLP Short Term Goal 1 (Week 3): STGs=LTGs d/t ELOS  Skilled Therapeutic Interventions: SLP conducted skilled therapy session targeting family education for cognitive goals. SLP and patient accompanied by wife and son with son leaving shortly into session. Patient and wife continue to present overall with reduced anticipatory awareness of difficulties that will be faced upon returning home. SLP attempted to increase patient's awareness, however patient not stimulable, thus did not indicate understanding across session. SLP discussed patient's current needs with wife including need for min-modA for all basic problem solving tasks and need for wife to handle all medication management. Wife verbalized understanding, and stated "we will manage just fine." SLP attempted to educate patient per PT on current mobility status to attempt verbal problem solving for home-based situations with patient requiring totalA to anticipate issues. Patient was left in lowered bed with call bell in reach and bed alarm set. SLP will continue to target goals per plan of care.    Pain Pain Assessment Pain Scale: 0-10 Pain Score: 0-No pain  Therapy/Group: Individual Therapy  Jeannie Done, M.A., CCC-SLP  [name] 08/01/2023, 12:22 PM

## 2023-08-01 NOTE — Progress Notes (Signed)
Pt refused his miralax again. Stated that he's waiting til he gets home to have a BM. Pt is aware that his last BM was last week and he states that's fine. This nurse tried encouraging him on the importance of having regular bowel movements, but pt still refused.

## 2023-08-01 NOTE — Progress Notes (Signed)
PROGRESS NOTE   Subjective/Complaints:  Cholecystostomy tube exchanged yesterday. Nursing to teach care.  His wife asks about follow-up with IR, IR note yesterday indicates IR clinic will call patient with appointment details for follow-up drain injection.  Patient is anxious to get home, he does not like the bed in the hospital.  ROS: Limited due to cognitive/behavioral   Objective:   IR EXCHANGE BILIARY DRAIN  Result Date: 07/31/2023 INDICATION: History of acute cholecystitis, post ultrasound fluoroscopic guided cholecystostomy tube placement on 07/03/2023 Malfunctioning chole tube, clogged. EXAM: FLUOROSCOPIC GUIDED CHOLECYSTOSTOMY TUBE EXCHANGE COMPARISON:  IR fluoroscopy, 07/03/2023.  US Abdomen, 07/03/2023. MEDICATIONS: None ANESTHESIA/SEDATION: Local anesthetic was administered. CONTRAST:  25mL OMNIPAQUE IOHEXOL 300 MG/ML SOLN - administered into the gallbladder lumen. FLUOROSCOPY TIME:  Fluoroscopic dose; 108 mGy COMPLICATIONS: None immediate. PROCEDURE: The patient was positioned supine on the fluoroscopy table. The external portion of the existing cholecystostomy tube as well as the surrounding skin was prepped and draped in usual sterile fashion. A time-out was performed prior to the initiation of the procedure. A preprocedural spot fluoroscopic image was obtained of the right upper abdominal quadrant existing cholecystostomy tube. The skin surrounding the cholecystostomy tube was anesthetized with 1% lidocaine. The external portion of the cholecystostomy tube was cut and interrupted cannulation with a short Amplatz wire. The wire would not pass at the distal catheter. The catheter was then removed, and using a 5 Fr catheter, access through the pre-existing tract was regained into the gallbladder. A 0.035 inch Amplatz wire was then was advanced through catheter and coiled within the gallbladder lumen Next, under intermittent  fluoroscopic guidance, a new, slightly larger now 12 Fr cholecystostomy tube which was repositioned into the more central aspect of the gallbladder lumen. Contrast injection confirms appropriate positioning and functionality of the cholecystomy tube. The cholecystostomy tube was flushed with a small amount of saline and reconnected to a gravity bag. The cholecystostomy tube was secured with an interrupted suture and a Stat Lock device. A dressing was applied. The patient tolerated the procedure well without immediate postprocedural complication. FINDINGS: Preprocedural spot fluoroscopic image demonstrates unchanged positioning of cholecystostomy tube with end coiled and locked over the expected location of the fundus of the gallbladder After fluoroscopic guided exchange, the new, slightly larger, now 12 Fr cholecystostomy tube is more ideally positioned with end coiled and locked within the central aspect of the gallbladder lumen. Post exchange cholangiogram demonstrates appropriate positioning and functionality of the new cholecystostomy tube. IMPRESSION: 1. Successful fluoroscopic guided replacement and up sizing for a now 12 Fr cholecystostomy tube. 2. Significant volume of radiolucent gallstones within a decompressed gallbladder. 3. Widely patent cystic duct. PLAN: The patient is scheduled for general surgery follow-up with Dr. Derrell Lolling on 09/17/2023. In the meantime, he will return to [facility] ([facility]) for routine drainage catheter evaluation and exchange in 6-8 weeks. Roanna Banning, MD [facility] Electronically Signed   By: Roanna Banning M.D.   On: 07/31/2023 17:38   DG Abd 2 Views  Result Date: 07/30/2023 CLINICAL DATA:  Constipation EXAM: ABDOMEN - 2 VIEW COMPARISON:  07/19/2023 FINDINGS: Supine and upright frontal views of the abdomen and  pelvis are obtained. Percutaneous cholecystostomy tube overlies right upper quadrant,  stable. No bowel obstruction or ileus. No significant fecal retention. There are no abdominal masses. 6 mm left renal calculus. No free gas within the greater peritoneal sac. Linear metallic foreign bodies within the right lower anterior abdominal wall again noted. IMPRESSION: 1. No bowel obstruction or ileus. 2. Stable percutaneous cholecystostomy tube. 3. Stable 6 mm left renal calculus. Electronically Signed   By: Sharlet Salina M.D.   On: 07/30/2023 18:43   Recent Labs    07/30/23 0645  WBC 8.0  HGB 12.7*  HCT 38.9*  PLT 234     Recent Labs    07/30/23 0645 08/01/23 0544  NA 140 141  K 3.9 4.1  CL 106 107  CO2 26 26  GLUCOSE 139* 190*  BUN 15 23  CREATININE 1.24 1.39*  CALCIUM 8.9 8.9      Intake/Output Summary (Last 24 hours) at 08/01/2023 1315 Last data filed at 08/01/2023 0644 Gross per 24 hour  Intake 245 ml  Output 425 ml  Net -180 ml        Physical Exam: Vital Signs Blood pressure (!) 142/60, pulse 77, temperature 97.7 F (36.5 C), temperature source Oral, resp. rate 18, height 6' (1.829 m), weight 75 kg, SpO2 100%.  Constitutional: No distress . Vital signs reviewed.  Sitting in wc HEENT: NCAT, conjugate gaze, oral membranes moist Neck: supple Cardiovascular: RRR    Respiratory/Chest: CTA Bilaterally without wheezes or rales. Normal effort    GI/Abdomen: BS +, non-tender, non-distended, soft, drain in RUQ area. Scant greenish fluid in drain bag.  Ext: no clubbing, cyanosis, or edema Psych: appropriate, less flat today Skin: C/D/I. No apparent lesions. Drain site intact MSK:      No joint swelling or tenderness noted Neurologic exam:  Cognition: Awake and alert.  Oriented to person, place, Poor insight.   Sensation: To light touch intact in BL UEs and LEs  CN: 2-12 grossly intact.  Coordination:  Bilateral upper extremity ataxia, left greater than right.  Strength: Moving all 4 extremities to gravity and resistance   Assessment/Plan: 1.  Functional deficits which require 3+ hours per day of interdisciplinary therapy in a comprehensive inpatient rehab setting. Physiatrist is providing close team supervision and 24 hour management of active medical problems listed below. Physiatrist and rehab team continue to assess barriers to discharge/monitor patient progress toward functional and medical goals  Care Tool:  Bathing  Bathing activity did not occur: Refused Body parts bathed by patient: Right arm, Chest, Left arm, Abdomen, Front perineal area, Buttocks, Right upper leg, Left upper leg, Face   Body parts bathed by helper: Left lower leg, Right lower leg     Bathing assist Assist Level: Minimal Assistance - Patient > 75%     Upper Body Dressing/Undressing Upper body dressing   What is the patient wearing?: Pull over shirt    Upper body assist Assist Level: Minimal Assistance - Patient > 75%    Lower Body Dressing/Undressing Lower body dressing      What is the patient wearing?: Pants     Lower body assist Assist for lower body dressing: Moderate Assistance - Patient 50 - 74%     Toileting Toileting Toileting Activity did not occur (Clothing management and hygiene only): N/A (no void or bm)  Toileting assist Assist for toileting: Minimal Assistance - Patient > 75%     Transfers Chair/bed transfer  Transfers assist     Chair/bed transfer assist  level: Moderate Assistance - Patient 50 - 74%     Locomotion Ambulation   Ambulation assist      Assist level: 2 helpers Assistive device: Walker-rolling Max distance: 100'   Walk 10 feet activity   Assist  Walk 10 feet activity did not occur: Safety/medical concerns  Assist level: Minimal Assistance - Patient > 75% Assistive device: Walker-rolling   Walk 50 feet activity   Assist Walk 50 feet with 2 turns activity did not occur: Safety/medical concerns  Assist level: Moderate Assistance - Patient - 50 - 74% Assistive device: Walker-rolling     Walk 150 feet activity   Assist Walk 150 feet activity did not occur: Safety/medical concerns         Walk 10 feet on uneven surface  activity   Assist Walk 10 feet on uneven surfaces activity did not occur: Safety/medical concerns         Wheelchair     Assist Is the patient using a wheelchair?: Yes Type of Wheelchair: Manual    Wheelchair assist level: Moderate Assistance - Patient 50 - 74% Max wheelchair distance: 5'    Wheelchair 50 feet with 2 turns activity    Assist        Assist Level: Moderate Assistance - Patient 50 - 74%   Wheelchair 150 feet activity     Assist      Assist Level: Total Assistance - Patient < 25%   Blood pressure (!) 142/60, pulse 77, temperature 97.7 F (36.5 C), temperature source Oral, resp. rate 18, height 6' (1.829 m), weight 75 kg, SpO2 100%.  1. 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  Antithrombotics: -DVT/anticoagulation:  Pharmaceutical: Heparin 5000U q8h -antiplatelet therapy: Aspirin and Plavix for three weeks followed by aspirin alone>>last dose of Plavix 8/30.    3. Pain Management: chronic back pain -Tylenol and Robaxin as needed, Voltaren QID, lidoderm daily             -continue tramadol 50 mg TID (ordered as q6h PRN currently) -Patient reporting chronic pain in multiple areas of his body, continue low-dose gabapentin 100 mg 3 times daily--may need to consider backing off he remains lethargic -9/10 lower back pain today.  Will schedule Tylenol, do not know if he remembers to ask for it. -9/13 continue scheduled Tylenol and current as needed medications -9/16 patient refusing Voltaren gel, Tylenol,  lidocaine patch.  Will discontinue lidocaine patch and change Tylenol and Voltaren gel to as needed   4. Mood/Behavior/Sleep/delirium: LCSW to evaluate and provide emotional support             -continue trazodone 50mg  q HS             -antipsychotic agents: reduce Zyprexa to 2.5 mg daily (already got 9/9 dose) -9/9 no new scheduled meds at bedtime currently   -will change timing of trazodone to 2100  -9/17 seen by neuro psychology for coping and adjustment  9/18 will DC zyprexa  5. Neuropsych/cognition: This patient is not quite capable of making decisions on his own behalf.   6. Skin/Wound Care: Routine skin care checks   7. Fluids/Electrolytes/Nutrition:  Routine Is and Os and follow-up chemistries             -dys 3 with thin liquids             -SLP eval, cont vitamins   I personally reviewed the patient's labs today.    8: Hypertension: monitor TID and prn (home Cozaar 25 mg daily held)             -continue metoprolol 50 mg BID -9/16 restart cozaar 25 mg daily as blood pressure has been above goal 9/18 BP a little labile, continue current regimen and monitor    08/01/2023   12:58 PM 08/01/2023    3:05 AM 07/31/2023    7:55 PM  Vitals with BMI  Systolic 142 155 478  Diastolic 60 75 63  Pulse 77 83 90       9: Hyperlipidemia: statin held (follow-up LFTs and restart atorvastatin 40 mg as appropriate)   10: Hypothyroidism: continue Synthroid daily   11: Acute cholecystitis s/p Cholecystostomy Tube             -routine drain care. Site is fine             -continue Levaquin 250 mg daily -9/13 patient asked about timing of possible cholecystectomy surgery.  Advised he will need to discuss this outpatient with general surgery follow-up 9/17 IR to exchange tube today  9/18 IR note indicates they will call him to schedule appointment, flush drain 3 times a day with normal saline   12: DM-2: CBGs QID; A1c = 8.8%; on insulin at home             -continue SSI  -9/6 CBGs  little higher today, continue to monitor trend for now -07/22/23 CBGs a little worse today, looks like he was on toujeo 54U nightly before admission and is now eating better; will restart semglee at 5U nightly to start-- verified with pharmacy that this would be an ok start.   -9/16 increase semglee to 7u  9/18 increase semglee to 9 u  CBG (last 3)  Recent Labs    07/31/23 2112 08/01/23 0553 08/01/23 1220  GLUCAP 246* 173* 197*      13: Hx of CAD s/p CABG 2016: on Plavix, asa, BB; ? resume statin   14: CKD IIIa: baseline Cr ~1.3             -follow-up BMP  -9/3--Cr 1.54, near baseline---no changes  -encourage appropriate fluids  -9/9 BUN/ CR trending up. ---push fluids  -recheck bmet tomorrow  -might need IVF -9/10 creatinine 1.55 although BUN up to 26.  Will add some IVF normal saline at night -9/16 discontinue normal saline and encourage oral fluid intake, creatinine and 9//18 Cr/BUN overall stable at 1.39/23  15: GI prophylaxis: continue Protonix 40mg  daily   16.  Bowel and bladder incontinence.    PVRs. -last Bms noted to be smears on 9/2 and 9/1, schedule miralax for constipation (see below) -9/13 intermittent incontinence of bladder, will check additional PVRs and IC greater than 350 -07/28/23 PVR 134 once, no others documented, monitor  17. R knee pain  -Start voltaren gel, consider xray if not improved by tomorrow  18. Malnutrition, severe  -9/5 RD following, appreciate assistance, continue to encourage PO intake   -Family plans to bring more outside food to help stimulate his appetite  -9/13 will do calorie count  -9/17 will start appetite stimulant tomorrow, megace  19. Constipation: meds: miralax BID,  senokot S 2 tabs daily  -9/5 continue miralax increase to BID, Check ABD xray  -9/6 moderate stool burden, will order sorbitol x1 -9/9 still no bm since 9/5 (smears)  -sorbitol 60cc today  -SSE if needed this afternoon -9/12 patient has been declining  MiraLAx -9/13 sorbitol 45 mL ordered -07/28/23 no BM since 9/9, took miralax yesterday and this morning; will do another sorbitol 60ml now since that worked last time, if no BM by tomorrow then may consider dulcolax/enema.  -07/29/23 still no BM, pt very irritated and not very agreeable; will see if he will accept dulcolax, already declined enema so I doubt he'll agree to that; will have nursing try to get dulcolax in today.   -9/16 will check KUB to assess stool burden   Will order 1/2 container bowel prep-patient declined this, KUB without acute findings  -9/18 Pt has been declining additional bowel medications    LOS: 18 days A FACE TO FACE EVALUATION WAS PERFORMED  Fanny Dance 08/01/2023, 1:15 PM

## 2023-08-01 NOTE — Progress Notes (Signed)
Physical Therapy Session Note  Patient Details  Name: Hunter Sparks MRN: [id number] Date of Birth: [birth year]  Today's Date: 08/01/2023 PT Individual Time: 1107-1206 + 1418-1530 PT Individual Time Calculation (min): 59 min + 72 min  Short Term Goals: Week 3:  PT Short Term Goal 1 (Week 3): STG = LTG due to ELOS  Skilled Therapeutic Interventions/Progress Updates:     SESSION 1: Pt presents in room in [facility], RN present and wife present for family education. Pt with significant lethargy during session requiring max verbal cues and increased assist needed for all mobility. Pt wife educated on current functional status as well as fluctuations related to level of alertness. Pt completes WC mobility with mod assist and max verbal cues for propulsion arc. Pt completes transfers throughout session with mod assist and max verbal cues with pt demonstrating excessive L lateropulsion. Pt completes ambulation 2x30' with mod assist, excessive L lateral lean and decreased LLE foot clearance noted. Pt wife does not assist with transfers due to increased assist needed, educated on needing additional help from sons for all transfers and mobility at this time. Pt wife also educated on pt requiring 24/7 supervision due to pt with decreased insight into deficits with pt wife verbalizing understanding. Pt wife assists pushing pt up/down ramp to practice ramp at home, she completes this without any assistance or verbal cues. Pt returns to room and remains seated in [facility] with all needs within reach, cal light in place and chair alarm donned and activated at end of session.   SESSION 2: Pt presents in room in bed, asleep, requires significantly increased time to wake with pt demonstrating lethargy throughout session requiring increased assistance for all mobility. Pt reporting back pain, declines intervention. Session focused on increasing alertness via therapeutic use of environment, transfer training, and NMR for seated  balance and power production.  Pt completes bed mobility with max assist for BLEs off bed and trunk to upright, pt unable to follow verbal commands for utilizing hospital bed rail due to lethargy and decreased strength. Pt completes transfer to [facility] with max assist due to insufficient hip/knee extension and poor motor planning to step. Pt transported dependently outside to increase alertness during daytime. Therapist attempt to prompt pt in BLE seated therex to promote improved BLE muscle fiber recruitment needed for functional transfers, ambulation, pt completes x10 seated marches with MAX verbal/tactile cues to attend to task as well as AAROM with LLE.  Pt transported back to day room dependently for time management. Pt completes short distance ambulation 6' from [facility] to mat with max verbal cues for R lateral lean LLE foot clearance and upright posture with pt requiring mod to max assist for positioning prior to sit. Pt completes x4 sit<>stands from elevated mat with RW min/mod assist, visual cues with mirror positioned in front of pt to correct L lateropulsion and facilitate upright gaze. While seated between stands pt reporting back pain, therapist provides assist for thoracic extension as well as pectoral stretch 2x60 seconds with pt reporting symptoms subsiding slightly.  Pt then completes NMR while seated to promote seated balance, core strengthening and power production including: -  ball pass x20 bouncing ball on ground, verbal cues for "slamming" ball on ground to increase power production.  -  x10 shoulder flexion with ball with therapist facilitating thoracic extension and posterior pelvic tilt - chest pass ball x10 (therapist assist with hand placement on ball to facilitate improved power production)  Pt ambulates 6' back to [facility]  with verbal cues for BIG steps, pt demonstrating exaggerated hip/knee flexion however improved stability and foot clearance.  Pt transported back to room and completes  ambulatory transfer back to bed with mod assist and max verbal cues for sequencing. Pt requires min assist for sit to supine for repositioning in supine for trunk and BLEs. Pt reporting pain in drain site with return to supine, stating burning sensation, MD present in room and notified with pt remaining supine with MD present at end of session.    Therapy Documentation Precautions:  Precautions Precautions: Fall Precaution Comments: left hemiparesis, pusher Restrictions Weight Bearing Restrictions: No Other Position/Activity Restrictions: Rt sided percutaneous drain   Therapy/Group: Individual Therapy  Edwin Cap PT, DPT 08/01/2023, 12:47 PM

## 2023-08-02 LAB — GLUCOSE, CAPILLARY
Glucose-Capillary: 130 mg/dL — ABNORMAL HIGH (ref 70–99)
Glucose-Capillary: 169 mg/dL — ABNORMAL HIGH (ref 70–99)
Glucose-Capillary: 161 mg/dL — ABNORMAL HIGH (ref 70–99)
Glucose-Capillary: 189 mg/dL — ABNORMAL HIGH (ref 70–99)

## 2023-08-02 MED ORDER — MAGNESIUM HYDROXIDE 400 MG/5ML PO SUSP
30.0000 mL | Freq: Once | ORAL | Status: AC
  Administered 2023-08-02: 30 mL via ORAL
  Filled 2023-08-02: qty 30

## 2023-08-02 MED ORDER — GLUCERNA SHAKE PO LIQD
237.0000 mL | Freq: Two times a day (BID) | ORAL | Status: DC
  Administered 2023-08-03: 237 mL via ORAL

## 2023-08-02 NOTE — Progress Notes (Signed)
Physical Therapy Session Note  Patient Details  Name: Hunter Sparks MRN: [id number] Date of Birth: 1946-05-02  Today's Date: 08/02/2023 PT Individual Time: 6063-0160 + 1320-1425 PT Individual Time Calculation (min): 58 min + 65 min  Short Term Goals: Week 3:  PT Short Term Goal 1 (Week 3): STG = LTG due to ELOS  Skilled Therapeutic Interventions/Progress Updates:    SESSION 1: Pt presents in room in bed, RN present to provide medications. Pt reporting back pain and not sleeping last night. Session focused on participating with therapeutic tasks including bed mobility, dressing, transfers, hygiene, and eating as well as gait training. Pt continues to report mistrust in hospital staff and decreased insight into deficits this session despite gentle reorientation and education.  Pt completes supine to sit with min assist with elevated HOB and bed rails, requires min assist for sitting balance with pt demonstrating LOB in all directions. Pt demonstrating truncal rigidity and with slow LOB requires verbal cues and assist for correction. Pt requires max assist for donning pants and shirt in sitting with max verbal cues. Pt requires max assist for stand pivot transfer to [facility] without device despite max verbal cues for pivoting hips prior to sit.  Pt completes face/hand hygiene when breakfast arrives. Pt wanting to eat breakfast, pt provided with set up assist and verbal cues for attending to task, taking ~30minutes to complete however pt does finish breakfast. Pt educated on importance of nutrition for building strength.   Pt completes hand hygiene at sink in [facility] and is transported dependently to gym. Pt completes sit<>stand with mod assist to RW, completes ambulation with mod assist for postural stability and LLE foot clearance due to L lateral lean, 2nd person WC follow. Pt completes ambulation 86' with eva walker mod assist and 2nd person WC follow, continues to require assist for LLE foot clearance.  Pt maintains flexed knees throughout entirety of gait with both trials despite verbal cues for upright posture.  Pt returns to room and remains seated in [facility] with all needs within reach, cal light in place and chair alarm donned and activated at end of session.   SESSION 2: Pt presents in room in [facility], asleep, awakens slowly. Pt reporting back pain declines intervention. Session focused on increasing alertness, WC mobility, and  NMR for seated balance, core strengthening, and anticipatory/reactive balance, power production as well as stretching for low back to decrease pain and improve tolerance to mobility. Pt continues to demonstrate lethargy and poor postural stability requiring increased assistance for all transfers, max assist, and requires CGA to mod assist for seated balance.  Pt transported to day room dependently for time management. Pt completes max assist squat pivot transfer WC to mat. Pt participates with seated NMR with therapist positioned posterior to pt on mat to promote optimal seated posture, anterior pelvic tilt and improved postural stability including: - ball pass (bounce pass) with 2nd person 15 minutes (pt completes hitting ball at first, able to catch ball 25% of time with verbal cues for hand positioning for anticipatory balance reactions), rest breaks every 2-4 minutes to promote improved energy conservation and posture - sit ups modified short sitting x3 with max assist for trunk to upright, minimal core engagement despite max verbal cues and variable UE positioning. Pt provided with rest breaks between sit ups to stretch thoracic extension 1-2 minutes x3  Pt completes max assist squat pivot transfer to R back to WC. Pt participates with WC mobility 150' with max verbal cues  mod assist for hand over hand assist, pt demonstrating small propulsion arc, requires significant time to complete task due to fatigue/lethargy and minimal carryover of verbal/tactile cues.  Pt returned to  room, completes max assist squat pivot WC to recliner to promtoe improved positioning tolerance. Pt remains seated in recliner with all needs within reach, call light in place, chair alarm activated at end of session.  Therapy Documentation Precautions:  Precautions Precautions: Fall Precaution Comments: left hemiparesis, pusher Restrictions Weight Bearing Restrictions: No Other Position/Activity Restrictions: Rt sided percutaneous drain  PT Missed Time  PT Amount of Missed Time (min) 10 Minutes  PT Missed Treatment Reason Patient fatigue     Therapy/Group: Individual Therapy  Edwin Cap PT, DPT 08/02/2023, 12:26 PM

## 2023-08-02 NOTE — Progress Notes (Signed)
Occupational Therapy Weekly Progress Note  Patient Details  Name: Hunter Sparks MRN: [id number] Date of Birth: 05-01-1946  Beginning of progress report period: July 15, 2023 End of progress report period: August 02, 2023  Today's Date: 08/02/2023 OT Individual Time: 1050-1200 OT Individual Time Calculation (min): 70 min    Patient has met 3 of 3 short term goals.  Patient is making slow but steady progress towards OT Goals. Pt has progressed to a mostly min a level with BADL tasks, however he still has a lateral lean to the L and needs cues to maintain full upright when going from sit<>stand. Continue current POC.   Patient continues to demonstrate the following deficits: muscle weakness, decreased cardiorespiratoy endurance, decreased midline orientation, decreased attention to left, and decreased motor planning, decreased initiation, decreased attention, decreased awareness, decreased problem solving, decreased safety awareness, and delayed processing, and decreased standing balance, decreased postural control, hemiplegia, and decreased balance strategies and therefore will continue to benefit from skilled OT intervention to enhance overall performance with BADL and Reduce care partner burden.  Patient progressing toward long term goals..  Continue plan of care.  OT Short Term Goals Week 2:  OT Short Term Goal 1 (Week 2): Patient will stand and free one hand at a time to help manage pants up or down during toileting OT Short Term Goal 1 - Progress (Week 2): Met OT Short Term Goal 2 (Week 2): Patient will identify need to void, and trasnfer to commode with min assist OT Short Term Goal 2 - Progress (Week 2): Met OT Short Term Goal 3 (Week 2): Pt will maintain dynamic standing balance with min A for 2 minutes in preparation for BADL tasks OT Short Term Goal 3 - Progress (Week 2): Met Week 3:  OT Short Term Goal 1 (Week 3): LTG=STG 2/2 ELOS  Skilled Therapeutic  Interventions/Progress Updates:    Pt greeted seated in wc and agreeable to OT treatment session. Pt brought to the sink with shaving task with electric razor. Pt falling asleep using razor and needed cues to  maintain alertness and keep shaving. Pt brought to therapy gym in wc and worked on [employer] there-ex using [employer] arm bike. Pt completed 4 minutes forward and 4 minutes backwards. Pt needed multimodal cues to maintain alertness. Pt brought to different gym and addressed sit<>stands and  midline orientation. Pt with motor impersistence in standing. Pt "melting" when standing more than a minute requiring multimodal cues to weight shift to the R and straighten B LE's. Functional use of  L UE to place cards. Blocked practice for safe sit<>stands and body mechanics. Pt requested to stay in wc and was left seated in wc with alarm belt on, call bell in reach, and needs met.   Therapy Documentation Precautions:  Precautions Precautions: Fall Precaution Comments: left hemiparesis, pusher Restrictions Weight Bearing Restrictions: No Other Position/Activity Restrictions: Rt sided percutaneous drain Pain: Pain Assessment Pain Scale: 0-10    Therapy/Group: Individual Therapy  Hunter Sparks 08/02/2023, 11:48 AM

## 2023-08-02 NOTE — Progress Notes (Signed)
Nutrition Follow-up  DOCUMENTATION CODES:   Severe malnutrition in context of acute illness/injury  INTERVENTION:  - Regular diet.  - Discontinue Ensure as patient does not like and has not been drinking.  - Add Glucerna Shake po BID, each supplement provides 220 kcal and 10 grams of protein - Continue Megace for appetite stimulation. - Continue Multivitamin with minerals daily. - Monitor BM's - pt has not had a BM since 9/9.  - Bowel regimen on board. - Monitor weight trends.   - No weight since 9/9, ordered.   NUTRITION DIAGNOSIS:   Severe Malnutrition related to acute illness as evidenced by moderate muscle depletion, energy intake < or equal to 50% for > or equal to 5 days. *ongoing  GOAL:   Patient will meet greater than or equal to 90% of their needs *progressing  MONITOR:   PO intake, Weight trends  REASON FOR ASSESSMENT:   Malnutrition Screening Tool    ASSESSMENT:   77 y.o. 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 PMH includes: angina, arthritis, depression, DJD, HLD, HTN, hypothyroidism, T2DM.  Patient reports he doesn't like the available hospital food. Thankfully, reports his family has been bringing him in food 2-3 times a day to increase his intake. He is documented to be consuming 0-100% of meals, average of 40%.  In terms of appetite, patient reports he "could eat or not eat" so it varies. Megace added 9/17 and patient taking. He does not like the Ensure and has not been drinking it. Hesitant to try another supplement but after discussing importance of eating well to get stronger and discharge patient agreeable. He is very motivated to get back home. Of note, patient reportedly has not had a BM since 9/9 . Currently ordered Miralax and Senokot and patient has been taking. Patient has not had a weight taken since 9/9, ordered new weight.    Medications reviewed and include: Insulin,  Miralax, Senokot, MVI, Megace   Labs reviewed:  Creatinine 1.39 HA1C 8.8 Blood Glucose 130-197 x24 hours   Diet Order:   Diet Order             Diet regular Room service appropriate? Yes; Fluid consistency: Thin  Diet effective now                   EDUCATION NEEDS:  Not appropriate for education at this time  Skin:  Skin Assessment: Reviewed RN Assessment  Last BM:  9/9  Height:  Ht Readings from Last 1 Encounters:  07/14/23 6' (1.829 m)   Weight:  Wt Readings from Last 1 Encounters:  07/23/23 75 kg    BMI:  Body mass index is 22.42 kg/m.  Estimated Nutritional Needs:  Kcal:  2100-2500 kcals Protein:  105-125 gm Fluid:  >/= 2.1 L    Shelle Iron RD, LDN For contact information, refer to [facility].

## 2023-08-02 NOTE — Progress Notes (Signed)
PROGRESS NOTE   Subjective/Complaints:  Hunter Sparks lying in bed today.  Reports he does not want to eat because he wants his family to bring him [employer] today, after nursing confirm this was not happening this morning patient agreed to eat something.  Continues to have poor p.o. intake, encouraged him to try to eat more.  ROS: Limited due to cognitive/behavioral   Objective:   No results found. No results for input(s): "WBC", "HGB", "HCT", "PLT" in the last 72 hours.    Recent Labs    08/01/23 0544  NA 141  K 4.1  CL 107  CO2 26  GLUCOSE 190*  BUN 23  CREATININE 1.39*  CALCIUM 8.9      Intake/Output Summary (Last 24 hours) at 08/02/2023 1649 Last data filed at 08/02/2023 1442 Gross per 24 hour  Intake 382 ml  Output 210 ml  Net 172 ml        Physical Exam: Vital Signs Blood pressure (!) 106/50, pulse 76, temperature 97.8 F (36.6 C), temperature source Axillary, resp. rate 16, height 6' (1.829 m), weight 75 kg, SpO2 99%.  Constitutional: No distress . Vital signs reviewed.  Lying in bed HEENT: NCAT, conjugate gaze, oral membranes moist Neck: supple Cardiovascular: RRR    Respiratory/Chest: CTA Bilaterally without wheezes or rales. Normal effort    GI/Abdomen: BS +, non-tender, non-distended, soft, drain in RUQ area. Scant greenish fluid in drain bag.  Ext: no clubbing, cyanosis, or edema Psych: appropriate, less flat today Skin: C/D/I. No apparent lesions. Drain site intact MSK:      No joint swelling or tenderness noted Neurologic exam:  Cognition: Awake and alert.  Confused.  Oriented to person, place, Poor insight.   Sensation: To light touch intact in BL UEs and LEs  CN: 2-12 grossly intact.  Coordination:  Bilateral upper extremity ataxia, left greater than right.  Strength: Moving all 4 extremities to gravity and resistance   Assessment/Plan: 1. Functional deficits which require 3+  hours per day of interdisciplinary therapy in a comprehensive inpatient rehab setting. Physiatrist is providing close team supervision and 24 hour management of active medical problems listed below. Physiatrist and rehab team continue to assess barriers to discharge/monitor patient progress toward functional and medical goals  Care Tool:  Bathing  Bathing activity did not occur: Refused Body parts bathed by patient: Right arm, Chest, Left arm, Abdomen, Front perineal area, Buttocks, Right upper leg, Left upper leg, Face   Body parts bathed by helper: Left lower leg, Right lower leg     Bathing assist Assist Level: Minimal Assistance - Patient > 75%     Upper Body Dressing/Undressing Upper body dressing   What is the patient wearing?: Pull over shirt    Upper body assist Assist Level: Minimal Assistance - Patient > 75%    Lower Body Dressing/Undressing Lower body dressing      What is the patient wearing?: Pants     Lower body assist Assist for lower body dressing: Moderate Assistance - Patient 50 - 74%     Toileting Toileting Toileting Activity did not occur (Clothing management and hygiene only): N/A (no void or bm)  Toileting assist Assist  for toileting: Minimal Assistance - Patient > 75%     Transfers Chair/bed transfer  Transfers assist     Chair/bed transfer assist level: Moderate Assistance - Patient 50 - 74%     Locomotion Ambulation   Ambulation assist      Assist level: 2 helpers Assistive device: Walker-rolling Max distance: 100'   Walk 10 feet activity   Assist  Walk 10 feet activity did not occur: Safety/medical concerns  Assist level: Minimal Assistance - Patient > 75% Assistive device: Walker-rolling   Walk 50 feet activity   Assist Walk 50 feet with 2 turns activity did not occur: Safety/medical concerns  Assist level: Moderate Assistance - Patient - 50 - 74% Assistive device: Walker-rolling    Walk 150 feet  activity   Assist Walk 150 feet activity did not occur: Safety/medical concerns         Walk 10 feet on uneven surface  activity   Assist Walk 10 feet on uneven surfaces activity did not occur: Safety/medical concerns         Wheelchair     Assist Is the patient using a wheelchair?: Yes Type of Wheelchair: Manual    Wheelchair assist level: Moderate Assistance - Patient 50 - 74% Max wheelchair distance: 44'    Wheelchair 50 feet with 2 turns activity    Assist        Assist Level: Moderate Assistance - Patient 50 - 74%   Wheelchair 150 feet activity     Assist      Assist Level: Total Assistance - Patient < 25%   Blood pressure (!) 106/50, pulse 76, temperature 97.8 F (36.6 C), temperature source Axillary, resp. rate 16, height 6' (1.829 m), weight 75 kg, SpO2 99%.  1. 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  Antithrombotics: -DVT/anticoagulation:  Pharmaceutical: Heparin 5000U q8h -antiplatelet therapy: Aspirin and Plavix for three weeks followed by aspirin alone>>last dose of Plavix 8/30.    3. Pain Management: chronic back pain -Tylenol and Robaxin as needed, Voltaren QID, lidoderm daily             -continue tramadol 50 mg TID (ordered as q6h PRN currently) -Patient reporting chronic pain in multiple areas of his body, continue low-dose gabapentin 100 mg 3 times daily--may need to consider backing off he remains lethargic -9/10 lower back pain today.  Will schedule Tylenol, do not know if he remembers to ask for it. -9/13 continue scheduled Tylenol and current as needed medications -9/16 patient refusing Voltaren gel, Tylenol, lidocaine patch.   Will discontinue lidocaine patch and change Tylenol and Voltaren gel to as needed   4. Mood/Behavior/Sleep/delirium: LCSW to evaluate and provide emotional support             -continue trazodone 50mg  q HS             -antipsychotic agents: reduce Zyprexa to 2.5 mg daily (already got 9/9 dose) -9/9 no new scheduled meds at bedtime currently   -will change timing of trazodone to 2100  -9/17 seen by neuro psychology for coping and adjustment  9/18 will DC zyprexa  5. Neuropsych/cognition: This  patient is not quite capable of making decisions on his own behalf.   6. Skin/Wound Care: Routine skin care checks   7. Fluids/Electrolytes/Nutrition: Routine Is and Os and follow-up chemistries             -dys 3 with thin liquids             -SLP eval, cont vitamins   I personally reviewed the patient's labs today.    8: Hypertension: monitor TID and prn (home Cozaar 25 mg daily held)             -continue metoprolol 50 mg BID -9/16 restart cozaar 25 mg daily as blood pressure has been above goal 9/18-9 BP a little labile, continue current regimen and monitor    08/02/2023    2:51 PM 08/02/2023    2:25 PM 08/02/2023    8:00 AM  Vitals with BMI  Systolic 106 104 191  Diastolic 50 72 70  Pulse 76 78 81       9: Hyperlipidemia: statin held (follow-up LFTs and restart atorvastatin 40 mg as appropriate)   10: Hypothyroidism: continue Synthroid daily   11: Acute cholecystitis s/p Cholecystostomy Tube             -routine drain care. Site is fine             -continue Levaquin 250 mg daily -9/13 patient asked about timing of possible cholecystectomy surgery.  Advised he will need to discuss this outpatient with general surgery follow-up 9/17 IR to exchange tube today  9/18 IR note indicates they will call him to schedule appointment, flush drain 3 times a day with normal saline   12: DM-2: CBGs QID; A1c = 8.8%; on insulin at home             -continue SSI  -9/6 CBGs little higher  today, continue to monitor trend for now -07/22/23 CBGs a little worse today, looks like he was on toujeo 54U nightly before admission and is now eating better; will restart semglee at 5U nightly to start-- verified with pharmacy that this would be an ok start.   -9/16 increase semglee to 7u  9/18 increase semglee to 9 u  -9/19 CBGs appear improved monitor response to medication change  CBG (last 3)  Recent Labs    08/02/23 0613 08/02/23 1207 08/02/23 1616  GLUCAP 130* 169* 161*      13: Hx of CAD s/p CABG 2016: on Plavix, asa, BB; ? resume statin   14: CKD IIIa: baseline Cr ~1.3             -follow-up BMP  -9/3--Cr 1.54, near baseline---no changes  -encourage appropriate fluids  -9/9 BUN/ CR trending up. ---push fluids  -recheck bmet tomorrow  -might need IVF -9/10 creatinine 1.55 although BUN up to 26.  Will add some IVF normal saline at night -9/16 discontinue normal saline and encourage oral fluid intake, creatinine and 9//18 Cr/BUN overall stable at 1.39/23  15: GI prophylaxis: continue Protonix 40mg  daily   16.  Bowel and bladder incontinence.    PVRs. -last Bms noted to be smears on 9/2 and 9/1, schedule miralax for constipation (see below) -9/13 intermittent incontinence of bladder, will check additional PVRs and IC greater than 350 -07/28/23 PVR 134 once, no others documented, monitor  17. R knee pain  -Start voltaren gel, consider xray if not improved by tomorrow  18. Malnutrition, severe  -9/5 RD following, appreciate assistance, continue to encourage  PO intake   -Family plans to bring more outside food to help stimulate his appetite  -9/13 will do calorie count  -9/17 will start appetite stimulant tomorrow, megace  9/19 patient reports appetite has improved since starting Megace however difficult to confirm  19. Constipation: meds: miralax BID, senokot S 2 tabs daily  -9/5 continue miralax increase to BID, Check ABD xray  -9/6 moderate stool burden, will order  sorbitol x1 -9/9 still no bm since 9/5 (smears)  -sorbitol 60cc today  -SSE if needed this afternoon -9/12 patient has been declining MiraLAx -9/13 sorbitol 45 mL ordered -07/28/23 no BM since 9/9, took miralax yesterday and this morning; will do another sorbitol 60ml now since that worked last time, if no BM by tomorrow then may consider dulcolax/enema.  -07/29/23 still no BM, pt very irritated and not very agreeable; will see if he will accept dulcolax, already declined enema so I doubt he'll agree to that; will have nursing try to get dulcolax in today.   -9/16 will check KUB to assess stool burden   Will order 1/2 container bowel prep-patient declined this, KUB without acute findings  -9/18 Pt has been declining additional bowel medications  9/19 will order milk of magnesia 30 mL    LOS: 19 days A FACE TO FACE EVALUATION WAS PERFORMED  Hunter Sparks 08/02/2023, 4:49 PM

## 2023-08-03 ENCOUNTER — Other Ambulatory Visit (HOSPITAL_COMMUNITY): Payer: Self-pay

## 2023-08-03 ENCOUNTER — Inpatient Hospital Stay (HOSPITAL_COMMUNITY): Payer: PPO

## 2023-08-03 LAB — GLUCOSE, CAPILLARY
Glucose-Capillary: 163 mg/dL — ABNORMAL HIGH (ref 70–99)
Glucose-Capillary: 181 mg/dL — ABNORMAL HIGH (ref 70–99)
Glucose-Capillary: 174 mg/dL — ABNORMAL HIGH (ref 70–99)
Glucose-Capillary: 201 mg/dL — ABNORMAL HIGH (ref 70–99)

## 2023-08-03 MED ORDER — MAGNESIUM CITRATE PO SOLN
1.0000 | Freq: Once | ORAL | Status: AC
  Administered 2023-08-03: 1 via ORAL
  Filled 2023-08-03: qty 296

## 2023-08-03 MED ORDER — ASPIRIN 81 MG PO TBEC
81.0000 mg | DELAYED_RELEASE_TABLET | Freq: Every day | ORAL | 0 refills | Status: DC
  Filled 2023-08-03: qty 120, 120d supply, fill #0

## 2023-08-03 MED ORDER — PANTOPRAZOLE SODIUM 40 MG PO TBEC
40.0000 mg | DELAYED_RELEASE_TABLET | Freq: Every day | ORAL | 0 refills | Status: AC
  Filled 2023-08-03: qty 30, 30d supply, fill #0

## 2023-08-03 MED ORDER — TOUJEO MAX SOLOSTAR 300 UNIT/ML ~~LOC~~ SOPN: 9.0000 [IU] | PEN_INJECTOR | Freq: Every day | SUBCUTANEOUS | Status: AC

## 2023-08-03 MED ORDER — METOPROLOL TARTRATE 50 MG PO TABS
50.0000 mg | ORAL_TABLET | Freq: Two times a day (BID) | ORAL | 0 refills | Status: DC
  Filled 2023-08-03: qty 60, 30d supply, fill #0

## 2023-08-03 MED ORDER — TRAZODONE HCL 50 MG PO TABS
50.0000 mg | ORAL_TABLET | Freq: Every day | ORAL | 0 refills | Status: DC
  Filled 2023-08-03: qty 14, 14d supply, fill #0

## 2023-08-03 MED ORDER — ACETAMINOPHEN 325 MG PO TABS: 650.0000 mg | ORAL_TABLET | Freq: Four times a day (QID) | ORAL | Status: DC | PRN

## 2023-08-03 MED ORDER — TRAMADOL HCL 50 MG PO TABS
50.0000 mg | ORAL_TABLET | Freq: Four times a day (QID) | ORAL | 0 refills | Status: AC | PRN
  Filled 2023-08-03: qty 20, 5d supply, fill #0

## 2023-08-03 MED ORDER — DICLOFENAC SODIUM 1 % EX GEL: 2.0000 g | Freq: Four times a day (QID) | CUTANEOUS | Status: AC | PRN

## 2023-08-03 MED ORDER — ADULT MULTIVITAMIN W/MINERALS CH: 1.0000 | ORAL_TABLET | Freq: Every day | ORAL | Status: DC

## 2023-08-03 NOTE — Plan of Care (Signed)
Problem: RH Balance Goal: LTG: Patient will maintain dynamic sitting balance (OT) Description: LTG:  Patient will maintain dynamic sitting balance with assistance during activities of daily living (OT) Outcome: Completed/Met Goal: LTG Patient will maintain dynamic standing with ADLs (OT) Description: LTG:  Patient will maintain dynamic standing balance with assist during activities of daily living (OT)  Outcome: Completed/Met   Problem: Sit to Stand Goal: LTG:  Patient will perform sit to stand in prep for activites of daily living with assistance level (OT) Description: LTG:  Patient will perform sit to stand in prep for activites of daily living with assistance level (OT) Outcome: Completed/Met   Problem: RH Grooming Goal: LTG Patient will perform grooming w/assist,cues/equip (OT) Description: LTG: Patient will perform grooming with assist, with/without cues using equipment (OT) Outcome: Completed/Met   Problem: RH Bathing Goal: LTG Patient will bathe all body parts with assist levels (OT) Description: LTG: Patient will bathe all body parts with assist levels (OT) Outcome: Completed/Met   Problem: RH Dressing Goal: LTG Patient will perform upper body dressing (OT) Description: LTG Patient will perform upper body dressing with assist, with/without cues (OT). Outcome: Completed/Met Goal: LTG Patient will perform lower body dressing w/assist (OT) Description: LTG: Patient will perform lower body dressing with assist, with/without cues in positioning using equipment (OT) Outcome: Completed/Met   Problem: RH Toileting Goal: LTG Patient will perform toileting task (3/3 steps) with assistance level (OT) Description: LTG: Patient will perform toileting task (3/3 steps) with assistance level (OT)  Outcome: Completed/Met   Problem: RH Toilet Transfers Goal: LTG Patient will perform toilet transfers w/assist (OT) Description: LTG: Patient will perform toilet transfers with assist,  with/without cues using equipment (OT) Outcome: Completed/Met   Problem: RH Tub/Shower Transfers Goal: LTG Patient will perform tub/shower transfers w/assist (OT) Description: LTG: Patient will perform tub/shower transfers with assist, with/without cues using equipment (OT) Outcome: Completed/Met   Problem: RH Memory Goal: LTG Patient will demonstrate ability for day to day recall/carry over during activities of daily living with assistance level (OT) Description: LTG:  Patient will demonstrate ability for day to day recall/carry over during activities of daily living with assistance level (OT). Outcome: Completed/Met   Problem: RH Attention Goal: LTG Patient will demonstrate this level of attention during functional activites (OT) Description: LTG:  Patient will demonstrate this level of attention during functional activites  (OT) Outcome: Completed/Met   Problem: RH Awareness Goal: LTG: Patient will demonstrate awareness during functional activites type of (OT) Description: LTG: Patient will demonstrate awareness during functional activites type of (OT) Outcome: Completed/Met

## 2023-08-03 NOTE — Progress Notes (Signed)
Provided discharge supplies for drain management.  Notified RN and placed bag in patients room with drain instructions and care at home.

## 2023-08-03 NOTE — Progress Notes (Signed)
Wife, [name] demonstrated flushing of drain. Educated to keep log of output.  Marylu Lund, RN

## 2023-08-03 NOTE — Progress Notes (Signed)
Speech Language Pathology Discharge Summary  Patient Details  Name: Hunter Sparks MRN: [id number] Date of Birth: Nov 17, 1945  Date of Discharge from SLP service:August 03, 2023  Today's Date: 08/03/2023 SLP Individual Time: 1400-1439 SLP Individual Time Calculation (min): 39 min  Skilled Therapeutic Interventions:  SLP conducted skilled therapy session targeting cognitive retraining goals. Patient continues to exhibit reduced overall cognitive participation in therapy d/t reduced intellectual awareness of deficits and resulting belief that he does not need any therapy. SLP administered SLUMS examination to assess for cognitive level upon discharge. Patient continues to exhibit significant deficits in all areas tested, scoring 5/30. During session, RN entered to administer laxative and remove IV. SLP assisted with reasoning and awareness with patient requiring totalA to understand purpose for medications. At end of session, RN entered to discuss how to flush patient's drain with wife, as wife had to leave and requested RN go over this with her prior. Patient was left in chair with call bell in reach and chair alarm set. SLP will continue to target goals per plan of care.     Patient has met 1 of 4 long term goals.  Patient to discharge at overall Max level.  Reasons goals not met: severity of deficits   Clinical Impression/Discharge Summary: Patient has made limited progress towards therapy goals this admission, meeting 1/4 long term goals set. Patient continues to exhibit reduced participation in therapy services d/t requirement of totalA for intellectual awareness, resulting in patient thinking he does not need any therapy. Patient is overall maxA for all cognitive functions, though did exhibit need for only minA to sustain attention to familiar and favorable tasks for 20+ minutes. Wife present for final session and informed of need for maxA for all cognitive tasks. Patient and family education  complete, patient appropriate for discharge from therapy services. Recommend 24/7 supervision and home health SLP services to target remaining cognitive deficits.   Care Partner:  Caregiver Able to Provide Assistance: Yes  Type of Caregiver Assistance: Cognitive  Recommendation:  Home Health SLP;24 hour supervision/assistance  Rationale for SLP Follow Up: Maximize cognitive function and independence   Equipment: n/a   Reasons for discharge: Discharged from hospital   Patient/Family Agrees with Progress Made and Goals Achieved: Yes   Jeannie Done, M.A., CCC-SLP  [name] 08/03/2023, 3:34 PM

## 2023-08-03 NOTE — Plan of Care (Signed)
  Problem: RH Balance Goal: LTG Patient will maintain dynamic standing balance (PT) Description: LTG:  Patient will maintain dynamic standing balance with assistance during mobility activities (PT) Outcome: Not Met (add Reason) Note: Inconsistent, fluctuates in assistance needed based on lethargy   Problem: Sit to Stand Goal: LTG:  Patient will perform sit to stand with assistance level (PT) Description: LTG:  Patient will perform sit to stand with assistance level (PT) Outcome: Not Met (add Reason) Note: Inconsistent, fluctuates in assistance needed based on lethargy   Problem: RH Bed to Chair Transfers Goal: LTG Patient will perform bed/chair transfers w/assist (PT) Description: LTG: Patient will perform bed to chair transfers with assistance (PT). Outcome: Not Met (add Reason) Note: Inconsistent, fluctuates in assistance needed based on lethargy   Problem: RH Car Transfers Goal: LTG Patient will perform car transfers with assist (PT) Description: LTG: Patient will perform car transfers with assistance (PT). Outcome: Not Met (add Reason) Note: Inconsistent, fluctuates in assistance needed based on lethargy   Problem: RH Ambulation Goal: LTG Patient will ambulate in controlled environment (PT) Description: LTG: Patient will ambulate in a controlled environment, # of feet with assistance (PT). Outcome: Not Met (add Reason) Note: Inconsistent due to lethargy, increased assistance and decreased endurance noted when lethargic however while alert pt able to ambulate up to 50' with min assist Goal: LTG Patient will ambulate in home environment (PT) Description: LTG: Patient will ambulate in home environment, # of feet with assistance (PT). Outcome: Not Met (add Reason) Note: Inconsistent secondary to lethargy

## 2023-08-03 NOTE — Progress Notes (Signed)
Physical Therapy Discharge Summary  Patient Details  Name: Hunter Sparks MRN: [id number] Date of Birth: June 16, 1946  Date of Discharge from PT service:August 03, 2023  Today's Date: 08/03/2023 PT Individual Time: 1145-1208 PT Individual Time Calculation (min): 23 min    Patient has met 3 of 9 long term goals due to improved activity tolerance, improved balance, improved postural control, and increased strength.  Patient to discharge at a wheelchair level Mod Assist.   Patient's care partner requires assistance to provide the necessary physical assistance at discharge, however pt wife reports she will have assistance from sons who live close by. Pt wife has had extensive education on pt current level of function and assist needed with pt wife verbalizing understanding.  Reasons goals not met: Pt performance fluctuates secondary to lethargy and motivation. Pt has had decreasing motivation over past few days secondary to wishing to return home.  Recommendation:  Patient will benefit from ongoing skilled PT services in home health setting to continue to advance safe functional mobility, address ongoing impairments in postural control, midline orientation, activity tolerance, seated/standing balance, gait mechanics, and minimize fall risk.  Equipment: WC  Reasons for discharge: discharge from hospital  Patient/family agrees with progress made and goals achieved: Yes    Skilled Treatment Interventions: SESSION 1: Pt presents in room in bed, agreeable to PT. Pt denies pain at this time and reports that he slept well. Session focused on gait training, transfer training and participation with self care tasks, and WC mobility.  Pt completes supine to sit EOB with supervision, CGA/supervision for seated balance however requires intermittent min assist for postural stability with verbal cues for donning pants and shirt.  Pt completes stand pivot transfer with mod assist to L. Pt self  propels WC 150' with supervision with significantly improved propulsion arc and negotiation of obstacles, min verbal cues.  Pt completes gait training 76' with RW min/mod assist with mod/max verbal cues for L foot clearance, continues to demonstrate L lateral lean more signifciant with fatigue however improved postural stability. Pt transported to ortho gym, pt completes ambulatory transfer to car mod assist with max verbal cues for LUE positioning on RW prior to sitting, able to manage BLEs into/out of car without assist.  Pt returns to day room, completes gait with RW 103' with RW min to max assist with 2nd person WC follow with max verbal cues for L foot clearance.  Pt returns to room and remains seated in [facility] with all needs within reach, cal light in place and chair alarm donned and activated at end of session.   SESSION 2: Pt originally not in room due to xray, pt returns to room at 1145, agreeable to education with wife, does not report pain. Session focused on continued education with pt wife as well as transfer training and HEP prescription. Pt and pt wife educated on current functional status and requiring assistance from pt's sons for all mobility at this time with pt wife verbalizing understanding. Pt provided with HEP with verbal instruction and therapist demonstration as pt too fatigued to participate. Pt completes bed mobility with supervision and increased time, completes stand pivot with min assist to R from bed to recliner to be upright for lunch.   HEP prescribed is as follows:  Access Code: [email or web address] URL: [email or web address] Date: 08/03/2023 Prepared by: Edwin Cap  Exercises - Seated March  - 3 x daily - 7 x weekly - 2 sets - 10 reps - Seated Long Arc Quad  -  3 x daily - 7 x weekly - 2 sets - 10 reps - Sit to Stand with Counter Support  - 2 x daily - 7 x weekly - 2 sets - 10 reps - Standing March with Counter Support  - 1 x daily - 7 x weekly - 2 sets - 10  reps  Pt remains seated in recliner with all needs within reach, cal light in place and chair alarm donned and activated at end of session. Pt wife at bedside at end of session.   PT Discharge Precautions/Restrictions Precautions Precautions: Fall Precaution Comments: left hemiparesis, pusher Restrictions Weight Bearing Restrictions: No Other Position/Activity Restrictions: Rt sided percutaneous drain Pain Interference Pain Interference Pain Effect on Sleep: 4. Almost constantly Pain Interference with Therapy Activities: 4. Almost constantly Pain Interference with Day-to-Day Activities: 3. Frequently Cognition Overall Cognitive Status: Impaired/Different from baseline Arousal/Alertness: Awake/alert Orientation Level: Oriented to person;Oriented to place Year:  (2027) Month:  (no answer) Day of Week: Incorrect Attention: Sustained Sustained Attention: Appears intact Sustained Attention Impairment: Verbal basic;Functional basic Memory: Impaired Memory Impairment: Retrieval deficit;Storage deficit;Decreased short term memory Decreased Short Term Memory: Verbal basic;Functional basic Awareness: Impaired Awareness Impairment: Intellectual impairment;Emergent impairment;Anticipatory impairment Problem Solving: Impaired Problem Solving Impairment: Verbal basic;Functional basic Executive Function: Initiating;Self Monitoring;Self Correcting Initiating: Impaired Initiating Impairment: Functional basic;Verbal basic Self Monitoring: Impaired Self Monitoring Impairment: Functional basic;Verbal basic Self Correcting: Impaired Self Correcting Impairment: Functional basic;Verbal basic Safety/Judgment: Impaired Sensation Coordination Gross Motor Movements are Fluid and Coordinated: No Fine Motor Movements are Fluid and Coordinated: No Coordination and Movement Description: 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 Finger Nose Finger Test: impaired - undershoots, difficulty following  instruction Motor  Motor Motor: Abnormal tone;Abnormal postural alignment and control;Hemiplegia;Motor impersistence Motor - Discharge Observations: LLE hemiplegia, abnormal postural alignment, L lateropulsion, improved slightly from evaluation but fluctuates based on fatigue level  Mobility Bed Mobility Rolling Right: Supervision/verbal cueing Rolling Left: Supervision/Verbal cueing Supine to Sit: Supervision/Verbal cueing Sit to Supine: Minimal Assistance - Patient > 75% Transfers Transfers: Sit to Stand;Stand to Sit;Stand Pivot Transfers Sit to Stand: Minimal Assistance - Patient > 75% Stand to Sit: Minimal Assistance - Patient > 75% Stand Pivot Transfers: Minimal Assistance - Patient > 75% Locomotion  Gait Ambulation: Yes Gait Assistance: Moderate Assistance - Patient 50-74%;2 Helpers;Minimal Assistance - Patient > 75% Gait Distance (Feet): 103 Feet Assistive device: Rolling walker Gait Assistance Details: Verbal cues for sequencing;Verbal cues for precautions/safety;Verbal cues for technique;Verbal cues for safe use of DME/AE  Trunk/Postural Assessment  Cervical Assessment Cervical Assessment: Exceptions to [facility] (forward head) Thoracic Assessment Thoracic Assessment: Exceptions to [facility] (thoracic kyphosis) Lumbar Assessment Lumbar Assessment: Exceptions to [facility] (posterior pelvic tilt) Postural Control Postural Control: Deficits on evaluation Trunk Control: lateral trunk lean to L Righting Reactions: delayed Protective Responses: delayed  Balance Balance Balance Assessed: Yes Static Sitting Balance Static Sitting - Balance Support: Bilateral upper extremity supported;Feet supported Static Sitting - Level of Assistance: 5: Stand by assistance Dynamic Sitting Balance Dynamic Sitting - Balance Support: No upper extremity supported;Feet supported;During functional activity Dynamic Sitting - Level of Assistance: 5: Stand by assistance Static Standing Balance Static Standing -  Balance Support: Bilateral upper extremity supported Static Standing - Level of Assistance: 4: Min assist Dynamic Standing Balance Dynamic Standing - Balance Support: Bilateral upper extremity supported Dynamic Standing - Level of Assistance: 4: Min assist Extremity Assessment  RLE Assessment RLE Assessment: Exceptions to [facility] General Strength Comments: grossly 4/5 LLE Assessment LLE Assessment: Exceptions to [facility] Strength Comments:  grossly 3/5   Edwin Cap PT, DPT 08/03/2023, 12:45 PM

## 2023-08-03 NOTE — Progress Notes (Signed)
Occupational Therapy Discharge Summary  Patient Details  Name: Hunter Sparks MRN: [id number] Date of Birth: 1946-03-12  Date of Discharge from OT service:August 03, 2023  Today's Date: 08/03/2023 OT Individual Time: 1000-1100 OT Individual Time Calculation (min): 60 min   Pt greeted seated in wc with spouse present. OT educated spouse on shower transfers, but discussed that pt will likely have to be sponge bathe at dc due to sponge. Bathing/dressing completed with overall min A with improved initiation. See below for further details regarding BADL performance.   Patient has met 12 of 12 long term goals due to improved activity tolerance, improved balance, postural control, ability to compensate for deficits, functional use of  LEFT upper and LEFT lower extremity, improved attention, improved awareness, and improved coordination.  Patient to discharge at [facility] Assist level.  Patient's care partner is independent to provide the necessary physical and cognitive assistance at discharge.    Reasons goals not met: n/a  Recommendation:  Patient will benefit from ongoing skilled OT services in home health setting to continue to advance functional skills in the area of BADL and Reduce care partner burden.  Equipment: Wc  Reasons for discharge: treatment goals met and discharge from hospital  Patient/family agrees with progress made and goals achieved: Yes  OT Discharge Precautions/Restrictions  Precautions Precautions: Fall Precaution Comments: left hemiparesis, pusher Restrictions Weight Bearing Restrictions: No Other Position/Activity Restrictions: Rt sided percutaneous drain Pain Pain Assessment Pain Scale: Faces Faces Pain Scale: No hurt ADL ADL Eating: Supervision/safety Grooming: Supervision/safety Where Assessed-Grooming: Sitting at sink Upper Body Bathing: Supervision/safety Lower Body Bathing: Minimal assistance Upper Body Dressing: Setup Lower Body Dressing:  Minimal assistance Toileting: Minimal assistance Toilet Transfer: Minimal assistance Toilet Transfer Method: Stand pivot Tub/Shower Transfer: Unable to assess Psychologist, counselling Transfer: Minimal assistance Perception  Perception: Impaired Perception-Other Comments: midline orientation improved Praxis Praxis: Impaired Praxis Impairment Details: Initiation;Motor planning Praxis-Other Comments: Improved since eval Cognition Cognition Overall Cognitive Status: Impaired/Different from baseline Arousal/Alertness: Awake/alert Orientation Level: Person;Place;Situation Person: Oriented Place: Oriented Situation: Oriented Memory: Impaired Memory Impairment: Retrieval deficit;Storage deficit;Decreased short term memory Safety/Judgment: Impaired Brief Interview for Mental Status (BIMS) Repetition of Three Words (First Attempt): 3 Temporal Orientation: Year: Correct Temporal Orientation: Month: Accurate within 5 days Temporal Orientation: Day: Correct Recall: "Sock": No, could not recall Recall: "Blue": Yes, no cue required Recall: "Bed": No, could not recall BIMS Summary Score: 11 Sensation Coordination Gross Motor Movements are Fluid and Coordinated: No Fine Motor Movements are Fluid and Coordinated: No Coordination and Movement Description: 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 Finger Nose Finger Test: impaired - undershoots, difficulty following instruction Motor  Motor Motor: Abnormal tone;Abnormal postural alignment and control;Hemiplegia;Motor impersistence Motor - Discharge Observations: LLE hemiplegia, abnormal postural alignment, L lateropulsion, improved slightly from evaluation but fluctuates based on fatigue level Mobility  Bed Mobility Rolling Right: Supervision/verbal cueing Rolling Left: Supervision/Verbal cueing Supine to Sit: Supervision/Verbal cueing Sit to Supine: Minimal Assistance - Patient > 75% Transfers Sit to Stand: Minimal Assistance - Patient >  75% Stand to Sit: Minimal Assistance - Patient > 75%  Trunk/Postural Assessment  Cervical Assessment Cervical Assessment: Exceptions to [facility] (forward head) Thoracic Assessment Thoracic Assessment: Exceptions to [facility] (thoracic kyphosis) Lumbar Assessment Lumbar Assessment: Exceptions to [facility] (posterior pelvic tilt) Postural Control Postural Control: Deficits on evaluation Trunk Control: lateral trunk lean to L Righting Reactions: delayed Protective Responses: delayed  Balance Balance Balance Assessed: Yes Static Sitting Balance Static Sitting - Balance Support: Bilateral upper extremity supported;Feet supported  Static Sitting - Level of Assistance: 5: Stand by assistance Dynamic Sitting Balance Dynamic Sitting - Level of Assistance: 5: Stand by assistance Static Standing Balance Static Standing - Level of Assistance: 4: Min assist Dynamic Standing Balance Dynamic Standing - Level of Assistance: 4: Min assist Extremity/Trunk Assessment RUE Assessment RUE Assessment: Within Functional Limits LUE Assessment LUE Assessment: Exceptions to [facility] Brunstrum level for arm: Stage V Relative Independence from [employer] Brunstrum level for hand: Stage VI Isolated joint movements   [name] Shemeca Lukasik 08/03/2023, 12:38 PM

## 2023-08-03 NOTE — Plan of Care (Signed)
  Problem: RH Cognition - SLP Goal: RH LTG Patient will demonstrate orientation with cues Description:  LTG:  Patient will demonstrate orientation to person/place/time/situation with cues (SLP)   Outcome: Not Met (add Reason) Note: Severity of impairments   Problem: RH Problem Solving Goal: LTG Patient will demonstrate problem solving for (SLP) Description: LTG:  Patient will demonstrate problem solving for basic/complex daily situations with cues  (SLP) Outcome: Not Met (add Reason) Note: Severity of impairments   Problem: RH Memory Goal: LTG Patient will use memory compensatory aids to (SLP) Description: LTG:  Patient will use memory compensatory aids to recall biographical/new, daily complex information with cues (SLP) Outcome: Not Met (add Reason) Note: Severity of impairments   Problem: RH Attention Goal: LTG Patient will demonstrate this level of attention during functional activites (SLP) Description: LTG:  Patient will will demonstrate this level of attention during functional activites (SLP) Outcome: Completed/Met

## 2023-08-03 NOTE — Progress Notes (Addendum)
PROGRESS NOTE   Subjective/Complaints:  No acute events noted overnight.  Did not see documentation of bowel movement recently although patient denies symptoms.  He says I will have a bowel movement when I get home.  Appetite reported to be improved.  He is very much looking forward to going home.  ROS: Limited due to cognitive/behavioral   Objective:   No results found. No results for input(s): "WBC", "HGB", "HCT", "PLT" in the last 72 hours.    Recent Labs    08/01/23 0544  NA 141  K 4.1  CL 107  CO2 26  GLUCOSE 190*  BUN 23  CREATININE 1.39*  CALCIUM 8.9      Intake/Output Summary (Last 24 hours) at 08/03/2023 1254 Last data filed at 08/03/2023 1253 Gross per 24 hour  Intake 1103 ml  Output 60 ml  Net 1043 ml        Physical Exam: Vital Signs Blood pressure (!) 147/73, pulse 68, temperature 97.8 F (36.6 C), resp. rate 18, height 6' (1.829 m), weight 75 kg, SpO2 98%.  Constitutional: No distress . Vital signs reviewed.  Lying in bed appears comfortable HEENT: NCAT, conjugate gaze, oral membranes moist Neck: supple Cardiovascular: RRR    Respiratory/Chest: CTA Bilaterally without wheezes or rales. Normal effort    GI/Abdomen: BS +, non-tender, mildly-distended, soft, drain in RUQ area. Scant greenish fluid in drain bag.  Ext: no clubbing, cyanosis, or edema Psych: appropriate, less flat today Skin: C/D/I. No apparent lesions. Drain site intact MSK:      No joint swelling or tenderness noted Neurologic exam:  Cognition: Awake and alert.  Confused.  Oriented to person, place, Poor insight.   Sensation: To light touch intact in BL UEs and LEs  CN: 2-12 grossly intact.  Coordination:  Bilateral upper extremity ataxia, left greater than right.  Strength: Moving all 4 extremities to gravity and resistance   Assessment/Plan: 1. Functional deficits which require 3+ hours per day of interdisciplinary  therapy in a comprehensive inpatient rehab setting. Physiatrist is providing close team supervision and 24 hour management of active medical problems listed below. Physiatrist and rehab team continue to assess barriers to discharge/monitor patient progress toward functional and medical goals  Care Tool:  Bathing  Bathing activity did not occur: Refused Body parts bathed by patient: Right arm, Chest, Left arm, Abdomen, Front perineal area, Buttocks, Right upper leg, Left upper leg, Face, Left lower leg, Right lower leg   Body parts bathed by helper: Left lower leg, Right lower leg     Bathing assist Assist Level: Minimal Assistance - Patient > 75%     Upper Body Dressing/Undressing Upper body dressing   What is the patient wearing?: Pull over shirt    Upper body assist Assist Level: Set up assist    Lower Body Dressing/Undressing Lower body dressing      What is the patient wearing?: Pants, Underwear/pull up     Lower body assist Assist for lower body dressing: Minimal Assistance - Patient > 75%     Toileting Toileting Toileting Activity did not occur (Clothing management and hygiene only): N/A (no void or bm)  Toileting assist Assist for toileting:  Minimal Assistance - Patient > 75%     Transfers Chair/bed transfer  Transfers assist     Chair/bed transfer assist level: Minimal Assistance - Patient > 75%     Locomotion Ambulation   Ambulation assist      Assist level: Moderate Assistance - Patient 50 - 74% Assistive device: Walker-rolling Max distance: 36'   Walk 10 feet activity   Assist  Walk 10 feet activity did not occur: Safety/medical concerns  Assist level: Minimal Assistance - Patient > 75% Assistive device: Walker-rolling   Walk 50 feet activity   Assist Walk 50 feet with 2 turns activity did not occur: Safety/medical concerns  Assist level: 2 helpers Assistive device: Walker-rolling    Walk 150 feet activity   Assist Walk 150  feet activity did not occur: Safety/medical concerns         Walk 10 feet on uneven surface  activity   Assist Walk 10 feet on uneven surfaces activity did not occur: Safety/medical concerns         Wheelchair     Assist Is the patient using a wheelchair?: Yes Type of Wheelchair: Manual    Wheelchair assist level: Supervision/Verbal cueing Max wheelchair distance: 46'    Wheelchair 50 feet with 2 turns activity    Assist        Assist Level: Supervision/Verbal cueing   Wheelchair 150 feet activity     Assist      Assist Level: Supervision/Verbal cueing   Blood pressure (!) 147/73, pulse 68, temperature 97.8 F (36.6 C), resp. rate 18, height 6' (1.829 m), weight 75 kg, SpO2 98%.  1. 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  Antithrombotics: -DVT/anticoagulation:  Pharmaceutical: Heparin 5000U q8h -antiplatelet therapy: Aspirin and Plavix for three weeks followed by aspirin alone>>last dose of Plavix 8/30.    3. Pain Management: chronic back pain -Tylenol and Robaxin as needed, Voltaren QID, lidoderm daily             -continue tramadol 50 mg TID (ordered as q6h PRN currently) -Patient reporting chronic pain in multiple areas of his body, continue low-dose gabapentin 100 mg 3 times daily--may need to consider backing off he remains lethargic -9/10 lower back pain today.  Will schedule Tylenol, do not know if he remembers to ask for it. -9/13 continue scheduled Tylenol and current as needed medications -9/16 patient refusing Voltaren gel, Tylenol, lidocaine patch.  Will discontinue lidocaine patch and change Tylenol and Voltaren gel to as needed    4. Mood/Behavior/Sleep/delirium: LCSW to evaluate and provide emotional support             -continue trazodone 50mg  q HS             -antipsychotic agents: reduce Zyprexa to 2.5 mg daily (already got 9/9 dose) -9/9 no new scheduled meds at bedtime currently   -will change timing of trazodone to 2100  -9/17 seen by neuro psychology for coping and adjustment  9/18 will DC zyprexa  Appears to be stable off the Zyprexa, continue to monitor  5. Neuropsych/cognition: This patient is not quite capable of making decisions on his  own behalf.   6. Skin/Wound Care: Routine skin care checks   7. Fluids/Electrolytes/Nutrition: Routine Is and Os and follow-up chemistries             -dys 3 with thin liquids             -SLP eval, cont vitamins   I personally reviewed the patient's labs today.    8: Hypertension: monitor TID and prn (home Cozaar 25 mg daily held)             -continue metoprolol 50 mg BID -9/16 restart cozaar 25 mg daily as blood pressure has been above goal 9/20 BP a little labile but overall in stable range, continue current regimen    08/03/2023    4:02 AM 08/02/2023    7:18 PM 08/02/2023    2:51 PM  Vitals with BMI  Systolic 147 103 272  Diastolic 73 59 50  Pulse 68 86 76       9: Hyperlipidemia: statin held (follow-up LFTs and restart atorvastatin 40 mg as appropriate)   10: Hypothyroidism: continue Synthroid daily   11: Acute cholecystitis s/p Cholecystostomy Tube             -routine drain care. Site is fine             -continue Levaquin 250 mg daily -9/13 patient asked about timing of possible cholecystectomy surgery.  Advised he will need to discuss this outpatient with general surgery follow-up 9/17 IR to exchange tube today  9/18 IR note indicates they will call him to schedule appointment, flush drain 3 times a day with normal saline  9/20 discussed follow-up with IR today   12: DM-2: CBGs QID; A1c = 8.8%; on insulin at home             -continue  SSI  -9/6 CBGs little higher today, continue to monitor trend for now -07/22/23 CBGs a little worse today, looks like he was on toujeo 54U nightly before admission and is now eating better; will restart semglee at 5U nightly to start-- verified with pharmacy that this would be an ok start.   -9/16 increase semglee to 7u  9/18 increase semglee to 9 u  -9/20 CBGs controlled continue current regimen, discussed with patient and wife that he will be on a lower dose of insulin going home  CBG (last 3)  Recent Labs    08/02/23 2045 08/03/23 0604 08/03/23 1210  GLUCAP 189* 163* 181*      13: Hx of CAD s/p CABG 2016: on Plavix, asa, BB; ? resume statin   14: CKD IIIa: baseline Cr ~1.3             -follow-up BMP  -9/3--Cr 1.54, near baseline---no changes  -encourage appropriate fluids  -9/9 BUN/ CR trending up. ---push fluids  -recheck bmet tomorrow  -might need IVF -9/10 creatinine 1.55 although BUN up to 26.  Will add some IVF normal saline at night -9/16 discontinue normal saline and encourage oral fluid intake, creatinine and 9//18 Cr/BUN overall stable at 1.39/23  15: GI prophylaxis: continue Protonix 40mg  daily   16.  Bowel and bladder incontinence.    PVRs. -last Bms noted to be smears on 9/2 and 9/1, schedule miralax for constipation (see below) -9/13 intermittent incontinence of bladder, will check additional PVRs and IC greater than 350 -07/28/23 PVR 134 once, no others documented, monitor  17. R knee pain  -Start voltaren gel, consider xray if not improved by  tomorrow  18. Malnutrition, severe  -9/5 RD following, appreciate assistance, continue to encourage PO intake   -Family plans to bring more outside food to help stimulate his appetite  -9/13 will do calorie count  -9/17 will start appetite stimulant tomorrow, megace  9/19 patient reports appetite has improved since starting Megace however difficult to confirm  19. Constipation: meds: miralax BID, senokot S 2 tabs  daily  -9/5 continue miralax increase to BID, Check ABD xray  -9/6 moderate stool burden, will order sorbitol x1 -9/9 still no bm since 9/5 (smears)  -sorbitol 60cc today  -SSE if needed this afternoon -9/12 patient has been declining MiraLAx -9/13 sorbitol 45 mL ordered -07/28/23 no BM since 9/9, took miralax yesterday and this morning; will do another sorbitol 60ml now since that worked last time, if no BM by tomorrow then may consider dulcolax/enema.  -07/29/23 still no BM, pt very irritated and not very agreeable; will see if he will accept dulcolax, already declined enema so I doubt he'll agree to that; will have nursing try to get dulcolax in today.   -9/16 will check KUB to assess stool burden   Will order 1/2 container bowel prep-patient declined this, KUB without acute findings  -9/18 Pt has been declining additional bowel medications  9/19 will order milk of magnesia 30 mL  9/20 order one-time dose of mag citrate, discussed with pharmacy.  Will check KUB.  It is possible that he has had some bowel movements that were not recorded.     LOS: 20 days A FACE TO FACE EVALUATION WAS PERFORMED  Fanny Dance 08/03/2023, 12:54 PM

## 2023-08-04 ENCOUNTER — Encounter: Payer: Self-pay | Admitting: Physician Assistant

## 2023-08-04 LAB — GLUCOSE, CAPILLARY: Glucose-Capillary: 137 mg/dL — ABNORMAL HIGH (ref 70–99)

## 2023-08-04 NOTE — Progress Notes (Signed)
PROGRESS NOTE   Subjective/Complaints:  Pt ready to go, slept poorly and doesn't really want to have a conversation this morning. Says he wants to leave. Denies pain or any other issues/complaints/concerns, had a BM yesterday. Reviewed meds, pt verbalizes understanding.   ROS: Limited due to cognitive/behavioral   Objective:   DG Abd 2 Views  Result Date: 08/03/2023 CLINICAL DATA:  Constipation. EXAM: ABDOMEN - 2 VIEW COMPARISON:  Radiograph 07/30/2023. CT 12/03/2021 FINDINGS: Right-sided pigtail catheter, slightly more medial after recent catheter exchange. Moderate volume of colonic stool. Moderate stool in the rectum. No bowel dilatation or obstruction. 6 mm calcification projecting over the lower left kidney. Right renal stone on prior exam is not seen. There are left pelvic phleboliths. IMPRESSION: 1. Moderate volume of colonic stool with moderate stool in the rectum. No bowel obstruction. 2. Right-sided pigtail catheter, slightly more medial after recent catheter exchange. Electronically Signed   By: Narda Rutherford M.D.   On: 08/03/2023 14:24   No results for input(s): "WBC", "HGB", "HCT", "PLT" in the last 72 hours.    No results for input(s): "NA", "K", "CL", "CO2", "GLUCOSE", "BUN", "CREATININE", "CALCIUM" in the last 72 hours.     Intake/Output Summary (Last 24 hours) at 08/04/2023 1157 Last data filed at 08/04/2023 0847 Gross per 24 hour  Intake 255 ml  Output 378 ml  Net -123 ml        Physical Exam: Vital Signs Blood pressure (!) 153/77, pulse 96, temperature 98 F (36.7 C), temperature source Oral, resp. rate 15, height 6' (1.829 m), weight 75 kg, SpO2 100%.  Constitutional: No distress . Vital signs reviewed.  Lying in bed appears comfortable, ready to go home HEENT: NCAT, conjugate gaze, oral membranes moist Neck: supple Cardiovascular: RRR    Respiratory/Chest: CTA Bilaterally without wheezes or rales.  Normal effort    GI/Abdomen: BS +, non-tender, nondistended, soft, drain in RUQ area. Scant greenish fluid in drain bag.  Ext: no clubbing, cyanosis, or edema Psych: flat  Skin: C/D/I. No apparent lesions. Drain site intact MSK:      No joint swelling or tenderness noted  PRIOR EXAMS: Neurologic exam:  Cognition: Awake and alert.  Confused.  Oriented to person, place, Poor insight.   Sensation: To light touch intact in BL UEs and LEs  CN: 2-12 grossly intact.  Coordination:  Bilateral upper extremity ataxia, left greater than right.  Strength: Moving all 4 extremities to gravity and resistance   Assessment/Plan: 1. Functional deficits which require 3+ hours per day of interdisciplinary therapy in a comprehensive inpatient rehab setting. Physiatrist is providing close team supervision and 24 hour management of active medical problems listed below. Physiatrist and rehab team continue to assess barriers to discharge/monitor patient progress toward functional and medical goals  Care Tool:  Bathing  Bathing activity did not occur: Refused Body parts bathed by patient: Right arm, Chest, Left arm, Abdomen, Front perineal area, Buttocks, Right upper leg, Left upper leg, Face, Left lower leg, Right lower leg   Body parts bathed by helper: Left lower leg, Right lower leg     Bathing assist Assist Level: Minimal Assistance - Patient > 75%  Upper Body Dressing/Undressing Upper body dressing   What is the patient wearing?: Pull over shirt    Upper body assist Assist Level: Set up assist    Lower Body Dressing/Undressing Lower body dressing      What is the patient wearing?: Pants, Underwear/pull up     Lower body assist Assist for lower body dressing: Minimal Assistance - Patient > 75%     Toileting Toileting Toileting Activity did not occur (Clothing management and hygiene only): N/A (no void or bm)  Toileting assist Assist for toileting: Minimal Assistance - Patient >  75%     Transfers Chair/bed transfer  Transfers assist     Chair/bed transfer assist level: Minimal Assistance - Patient > 75%     Locomotion Ambulation   Ambulation assist      Assist level: Moderate Assistance - Patient 50 - 74% Assistive device: Walker-rolling Max distance: 36'   Walk 10 feet activity   Assist  Walk 10 feet activity did not occur: Safety/medical concerns  Assist level: Minimal Assistance - Patient > 75% Assistive device: Walker-rolling   Walk 50 feet activity   Assist Walk 50 feet with 2 turns activity did not occur: Safety/medical concerns  Assist level: 2 helpers Assistive device: Walker-rolling    Walk 150 feet activity   Assist Walk 150 feet activity did not occur: Safety/medical concerns         Walk 10 feet on uneven surface  activity   Assist Walk 10 feet on uneven surfaces activity did not occur: Safety/medical concerns         Wheelchair     Assist Is the patient using a wheelchair?: Yes Type of Wheelchair: Manual    Wheelchair assist level: Supervision/Verbal cueing Max wheelchair distance: 64'    Wheelchair 50 feet with 2 turns activity    Assist        Assist Level: Supervision/Verbal cueing   Wheelchair 150 feet activity     Assist      Assist Level: Supervision/Verbal cueing   Blood pressure (!) 153/77, pulse 96, temperature 98 F (36.7 C), temperature source Oral, resp. rate 15, height 6' (1.829 m), weight 75 kg, SpO2 100%.  1. 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  Antithrombotics: -DVT/anticoagulation:   Pharmaceutical: Heparin 5000U q8h -antiplatelet therapy: Aspirin and Plavix for three weeks followed by aspirin alone>>last dose of Plavix 8/30.    3. Pain Management: chronic back pain -Tylenol and Robaxin as needed, Voltaren QID, lidoderm daily             -continue tramadol 50 mg TID (ordered as q6h PRN currently) -Patient reporting chronic pain in multiple areas of his body, continue low-dose gabapentin 100 mg 3 times daily--may need to consider backing off he remains lethargic -9/10 lower back pain today.  Will schedule Tylenol, do not know if he remembers to ask for it. -9/13 continue scheduled Tylenol and current as needed medications -9/16 patient refusing Voltaren gel, Tylenol, lidocaine patch.  Will discontinue lidocaine patch and change Tylenol and Voltaren gel to  as needed   4. Mood/Behavior/Sleep/delirium: LCSW to evaluate and provide emotional support             -continue trazodone 50mg  q HS             -antipsychotic agents: reduce Zyprexa to 2.5 mg daily (already got 9/9 dose) -9/9 no new scheduled meds at bedtime currently   -will change timing of trazodone to 2100  -9/17 seen by neuro psychology for coping and adjustment  9/18 will DC zyprexa  Appears to be stable off the Zyprexa, continue to monitor  5. Neuropsych/cognition: This patient is not quite capable of making decisions on his own behalf.   6. Skin/Wound Care: Routine skin care checks   7. Fluids/Electrolytes/Nutrition: Routine Is and Os and follow-up chemistries             -dys 3 with thin liquids             -SLP eval, cont vitamins   I personally reviewed the patient's labs today.    8: Hypertension: monitor TID and prn (home Cozaar 25 mg daily held)             -continue metoprolol 50 mg BID -9/16 restart cozaar 25 mg daily as blood pressure has been above goal 9/20 BP a little labile but overall in stable range, continue current regimen -08/04/23 BPs ok, f/up outpatient  Vitals:   08/01/23 0305  08/01/23 1258 08/01/23 1915 08/02/23 0544  BP: (!) 155/75 (!) 142/60 (!) 115/53 (!) 144/73   08/02/23 0800 08/02/23 1425 08/02/23 1451 08/02/23 1918  BP: (!) 147/70 104/72 (!) 106/50 (!) 103/59   08/03/23 0402 08/03/23 1511 08/03/23 1927 08/04/23 0404  BP: (!) 147/73 123/63 (!) 155/76 (!) 153/77      9: Hyperlipidemia: statin held (follow-up LFTs and restart atorvastatin 40 mg as appropriate)   10: Hypothyroidism: continue Synthroid daily   11: Acute cholecystitis s/p Cholecystostomy Tube             -routine drain care. Site is fine             -continue Levaquin 250 mg daily -9/13 patient asked about timing of possible cholecystectomy surgery.  Advised he will need to discuss this outpatient with general surgery follow-up 9/17 IR to exchange tube today  9/18 IR note indicates they will call him to schedule appointment, flush drain 3 times a day with normal saline  9/20 discussed follow-up with IR today   12: DM-2: CBGs QID; A1c = 8.8%; on insulin at home             -continue SSI  -9/6 CBGs little higher today, continue to monitor trend for now -07/22/23 CBGs a little worse today, looks like he was on toujeo 54U nightly before admission and is now eating better; will restart semglee at 5U nightly to start-- verified with pharmacy that this would be an ok start.   -9/16 increase semglee to 7u  9/18 increase semglee to 9 u  -9/20 CBGs controlled continue current regimen, discussed with patient and wife that he will be on a lower dose of insulin going home  -08/04/23 CBGs ok, f/up outpatient  CBG (last 3)  Recent Labs    08/03/23 1648 08/03/23 2033 08/04/23 0551  GLUCAP 174* 201* 137*      13: Hx of CAD s/p CABG 2016: on Plavix, asa, BB; ? resume statin   14: CKD IIIa: baseline Cr ~1.3             -  follow-up BMP  -9/3--Cr 1.54, near baseline---no changes  -encourage appropriate fluids  -9/9 BUN/ CR trending up. ---push fluids  -recheck bmet tomorrow  -might need  IVF -9/10 creatinine 1.55 although BUN up to 26.  Will add some IVF normal saline at night -9/16 discontinue normal saline and encourage oral fluid intake, creatinine and 9//18 Cr/BUN overall stable at 1.39/23  15: GI prophylaxis: continue Protonix 40mg  daily   16.  Bowel and bladder incontinence.    PVRs. -last Bms noted to be smears on 9/2 and 9/1, schedule miralax for constipation (see below) -9/13 intermittent incontinence of bladder, will check additional PVRs and IC greater than 350 -07/28/23 PVR 134 once, no others documented, monitor  17. R knee pain  -Start voltaren gel, consider xray if not improved by tomorrow  18. Malnutrition, severe  -9/5 RD following, appreciate assistance, continue to encourage PO intake   -Family plans to bring more outside food to help stimulate his appetite  -9/13 will do calorie count  -9/17 will start appetite stimulant tomorrow, megace  9/19 patient reports appetite has improved since starting Megace however difficult to confirm  19. Constipation: meds: miralax BID, senokot S 2 tabs daily  -9/5 continue miralax increase to BID, Check ABD xray  -9/6 moderate stool burden, will order sorbitol x1 -9/9 still no bm since 9/5 (smears)  -sorbitol 60cc today  -SSE if needed this afternoon -9/12 patient has been declining MiraLAx -9/13 sorbitol 45 mL ordered -07/28/23 no BM since 9/9, took miralax yesterday and this morning; will do another sorbitol 60ml now since that worked last time, if no BM by tomorrow then may consider dulcolax/enema.  -07/29/23 still no BM, pt very irritated and not very agreeable; will see if he will accept dulcolax, already declined enema so I doubt he'll agree to that; will have nursing try to get dulcolax in today.   -9/16 will check KUB to assess stool burden   Will order 1/2 container bowel prep-patient declined this, KUB without acute findings  -9/18 Pt has been declining additional bowel medications  9/19 will order  milk of magnesia 30 mL  9/20 order one-time dose of mag citrate, discussed with pharmacy.  Will check KUB.  It is possible that he has had some bowel movements that were not recorded.   -08/04/23 KUB yesterday with moderate stool burden, had BM yesterday, advised continuation of meds/diet when home to avoid constipation    LOS: 21 days A FACE TO FACE EVALUATION WAS PERFORMED  [address] 08/04/2023, 11:57 AM

## 2023-08-06 NOTE — Progress Notes (Signed)
  Inpatient Rehabilitation Discharge Medication Review by a Pharmacist   A complete drug regimen review was completed for this patient to identify any potential clinically significant medication issues.   High Risk Drug Classes Is patient taking? Indication by Medication  Antipsychotic No    Anticoagulant No    Antibiotic No    Opioid Yes PRN Tramadol - pain  Antiplatelet Yes Aspirin 81 mg - stroke prophylaxis  Hypoglycemics/insulin Yes Toujeo - DM Type 2  Vasoactive Medication Yes Losartan, metoprolol - hypertension  Chemotherapy No    Other Yes Levothyroxine - hypothyroidism Pantoprazole - GI prophylaxis Multivitamin  - supplement Senna-docusate - laxative Trazodone - sleep  PRNs: Acetaminophen - mild pain Diclofenac gel - topical pain relief        Type of Medication Issue Identified Description of Issue Recommendation(s)  Drug Interaction(s) (clinically significant)        Duplicate Therapy        Allergy        No Medication Administration End Date        Incorrect Dose        Additional Drug Therapy Needed        Significant med changes from prior encounter (inform family/care partners about these prior to discharge). Atorvastatin was held while LFTs were elevated. Consider resuming Atorvastatin as outpatient if LFTs normal.  Other            Clinically significant medication issues were identified that warrant physician communication and completion of prescribed/recommended actions by midnight of the next day:  No    Pharmacist comments:  - Clopidogrel x 21 days completed on 07/14/23    Time spent performing this drug regimen review (minutes):  [address], [place], [place]

## 2023-08-06 NOTE — Progress Notes (Signed)
Inpatient Rehabilitation Care Coordinator Discharge Note   Patient Details  Name: Hunter Sparks MRN: [id number] Date of Birth: June 26, 1946   Discharge location: D/c to home with wife  Length of Stay: 20 days  Discharge activity level: w/c at Mod Asst  Home/community participation: Limited  Patient response WU:JWJXBJ Literacy - How often do you need to have someone help you when you read instructions, pamphlets, or other written material from your doctor or pharmacy?: Rarely  Patient response YN:WGNFAO Isolation - How often do you feel lonely or isolated from those around you?: Never  Services provided included: MD, RD, PT, OT, SLP, RN, Pharmacy, Neuropsych, SW, TR, CM  Financial Services:  Field seismologist Utilized: Private Insurance [employer]  Choices offered to/list presented to: patient wife  Follow-up services arranged:  Home Health, DME Home Health Agency: [facility]  for HHPT/OT/SLP/SN/Aide.    DME : [facility] for w/c    Patient response to transportation need: Is the patient able to respond to transportation needs?: Yes In the past 12 months, has lack of transportation kept you from medical appointments or from getting medications?: No In the past 12 months, has lack of transportation kept you from meetings, work, or from getting things needed for daily living?: No   Patient/Family verbalized understanding of follow-up arrangements:  Yes  Individual responsible for coordination of the follow-up plan: contact pt wife [phone]  Confirmed correct DME delivered: Gretchen Short 08/06/2023    Comments (or additional information):fam edu completed  Summary of Stay    Date/Time Discharge Planning CSW  07/31/23 0950 D/c remains home with wife who will be primary caregiver. Wife is unable to provide any physical assistance due to COPD. She would like him to be able to ambulate with an AD. Some PRN support from their son. Wife intends to  have the ramp in place, walk-in shower, installing raised toilet seat, and grab bar by the toilet. [name], and next session on 9/18 9am-12pm. [facility] for HHPT/OT/SLP/aide/SN. SW will confirm there are no barriers to discharge. [name]  07/23/23 1001 D/c remains home with wife who will be primary caregiver. Wife is unable to provide any physical assistance due to COPD. She would like him to be able to ambulate with an AD. SW will confirm there are no barriers to discharge. [name]  07/17/23 0957 D/c remains home with wife who will be primary caregiver. Wife is unable to provide any physical assistance due to COPD. She would like him to be able to ambulate with an AD. SW will confirm there are no barriers to discharge. [name]       Hunter Sparks Hunter Sparks

## 2023-08-07 DIAGNOSIS — I129 Hypertensive chronic kidney disease with stage 1 through stage 4 chronic kidney disease, or unspecified chronic kidney disease: Secondary | ICD-10-CM | POA: Diagnosis not present

## 2023-08-07 DIAGNOSIS — E039 Hypothyroidism, unspecified: Secondary | ICD-10-CM | POA: Diagnosis not present

## 2023-08-07 DIAGNOSIS — R410 Disorientation, unspecified: Secondary | ICD-10-CM | POA: Diagnosis not present

## 2023-08-07 DIAGNOSIS — N179 Acute kidney failure, unspecified: Secondary | ICD-10-CM | POA: Diagnosis not present

## 2023-08-07 DIAGNOSIS — Z7982 Long term (current) use of aspirin: Secondary | ICD-10-CM | POA: Diagnosis not present

## 2023-08-07 DIAGNOSIS — M545 Low back pain, unspecified: Secondary | ICD-10-CM | POA: Diagnosis not present

## 2023-08-07 DIAGNOSIS — Z434 Encounter for attention to other artificial openings of digestive tract: Secondary | ICD-10-CM | POA: Diagnosis not present

## 2023-08-07 DIAGNOSIS — I69354 Hemiplegia and hemiparesis following cerebral infarction affecting left non-dominant side: Secondary | ICD-10-CM | POA: Diagnosis not present

## 2023-08-07 DIAGNOSIS — I959 Hypotension, unspecified: Secondary | ICD-10-CM | POA: Diagnosis not present

## 2023-08-07 DIAGNOSIS — Z556 Problems related to health literacy: Secondary | ICD-10-CM | POA: Diagnosis not present

## 2023-08-07 DIAGNOSIS — Z794 Long term (current) use of insulin: Secondary | ICD-10-CM | POA: Diagnosis not present

## 2023-08-07 DIAGNOSIS — I2510 Atherosclerotic heart disease of native coronary artery without angina pectoris: Secondary | ICD-10-CM | POA: Diagnosis not present

## 2023-08-07 DIAGNOSIS — I25119 Atherosclerotic heart disease of native coronary artery with unspecified angina pectoris: Secondary | ICD-10-CM | POA: Diagnosis not present

## 2023-08-07 DIAGNOSIS — Z87891 Personal history of nicotine dependence: Secondary | ICD-10-CM | POA: Diagnosis not present

## 2023-08-07 DIAGNOSIS — G8929 Other chronic pain: Secondary | ICD-10-CM | POA: Diagnosis not present

## 2023-08-07 DIAGNOSIS — M1990 Unspecified osteoarthritis, unspecified site: Secondary | ICD-10-CM | POA: Diagnosis not present

## 2023-08-07 DIAGNOSIS — F32A Depression, unspecified: Secondary | ICD-10-CM | POA: Diagnosis not present

## 2023-08-07 DIAGNOSIS — G9341 Metabolic encephalopathy: Secondary | ICD-10-CM | POA: Diagnosis not present

## 2023-08-07 DIAGNOSIS — Z951 Presence of aortocoronary bypass graft: Secondary | ICD-10-CM | POA: Diagnosis not present

## 2023-08-07 DIAGNOSIS — K810 Acute cholecystitis: Secondary | ICD-10-CM | POA: Diagnosis not present

## 2023-08-07 DIAGNOSIS — E785 Hyperlipidemia, unspecified: Secondary | ICD-10-CM | POA: Diagnosis not present

## 2023-08-07 DIAGNOSIS — N1831 Chronic kidney disease, stage 3a: Secondary | ICD-10-CM | POA: Diagnosis not present

## 2023-08-07 DIAGNOSIS — E1122 Type 2 diabetes mellitus with diabetic chronic kidney disease: Secondary | ICD-10-CM | POA: Diagnosis not present

## 2023-08-09 ENCOUNTER — Other Ambulatory Visit (HOSPITAL_COMMUNITY): Payer: Self-pay

## 2023-08-22 ENCOUNTER — Telehealth (HOSPITAL_COMMUNITY): Payer: Self-pay | Admitting: Student

## 2023-08-22 NOTE — Telephone Encounter (Signed)
Patient's wife called IR today with concerns that she is unable to flush the percutaneous cholecystostomy and there is very little drain output. Tube recently exchanged 07/31/23 due to malfunction/clog. Patient has some mild RUQ discomfort but is otherwise in his usual state of health.  Appointment made for patient to be evaluated in IR tomorrow for a drain assessment with possible exchange. Patient's wife instructed to have patient seek medical attention if he develops fevers, chills, nausea, vomiting or worsening abdominal pain between now and tomorrow's appointment.   Alwyn Ren, [place] [phone] 08/22/2023, 11:15 AM

## 2023-08-23 ENCOUNTER — Other Ambulatory Visit (HOSPITAL_COMMUNITY): Payer: Self-pay | Admitting: Radiology

## 2023-08-23 ENCOUNTER — Other Ambulatory Visit (HOSPITAL_COMMUNITY): Payer: Self-pay | Admitting: Interventional Radiology

## 2023-08-23 ENCOUNTER — Ambulatory Visit (HOSPITAL_COMMUNITY)
Admission: RE | Admit: 2023-08-23 | Discharge: 2023-08-23 | Disposition: A | Discharge status: Home / Self Care | Payer: PPO | Source: Ambulatory Visit | Attending: Radiology | Admitting: Radiology

## 2023-08-23 DIAGNOSIS — K819 Cholecystitis, unspecified: Secondary | ICD-10-CM

## 2023-08-23 DIAGNOSIS — K810 Acute cholecystitis: Secondary | ICD-10-CM | POA: Diagnosis not present

## 2023-08-23 DIAGNOSIS — Z434 Encounter for attention to other artificial openings of digestive tract: Secondary | ICD-10-CM | POA: Diagnosis not present

## 2023-08-23 DIAGNOSIS — Z4682 Encounter for fitting and adjustment of non-vascular catheter: Secondary | ICD-10-CM | POA: Diagnosis not present

## 2023-08-23 HISTORY — PX: IR EXCHANGE BILIARY DRAIN: IMG6046

## 2023-08-23 MED ORDER — IOHEXOL 300 MG/ML  SOLN
50.0000 mL | Freq: Once | INTRAMUSCULAR | Status: AC | PRN
  Administered 2023-08-23: 10 mL

## 2023-08-23 MED ORDER — LIDOCAINE HCL 1 % IJ SOLN
10.0000 mL | Freq: Once | INTRAMUSCULAR | Status: AC
  Administered 2023-08-23: 10 mL via INTRADERMAL

## 2023-08-23 MED ORDER — LIDOCAINE-EPINEPHRINE 1 %-1:100000 IJ SOLN
INTRAMUSCULAR | Status: AC
  Filled 2023-08-23: qty 1

## 2023-08-23 NOTE — Procedures (Signed)
Vascular and Interventional Radiology Procedure Note  Patient: Hunter Sparks DOB: 04-04-1946 Medical Record Number: [id number] Note Date/Time: 08/23/23 9:19 AM   Performing Physician: Roanna Banning, MD Assistant(s): None  Diagnosis: Hx of acute cholecystitis. Drain placed 07/03/23, last exchange 07/31/23.  Procedure:  CHOLECYSTOSTOMY TUBE EXCHANGE ANTEROGRADE CHOLANGIOGRAM  Anesthesia: Local Anesthetic Complications: None Estimated Blood Loss: Minimal Specimens:  None  Findings:  Successful exchange for a new 38F cholecystostomy tube. NON-patent cystic duct  Plan: Flush tube w 5 mL sterile NS q8h and record drain output qShift. Follow up for routine tube evaluation in 8 week(s).   See detailed procedure note with images in PACS. The patient tolerated the procedure well without incident or complication and was returned to Recovery in stable condition.    Roanna Banning, MD [facility]   Pager. [phone] Clinic. [phone]

## 2023-08-27 ENCOUNTER — Telehealth: Payer: Self-pay | Admitting: Cardiology

## 2023-08-27 ENCOUNTER — Ambulatory Visit: Payer: Self-pay | Admitting: General Surgery

## 2023-08-27 DIAGNOSIS — T85518A Breakdown (mechanical) of other gastrointestinal prosthetic devices, implants and grafts, initial encounter: Secondary | ICD-10-CM | POA: Diagnosis not present

## 2023-08-27 DIAGNOSIS — K8020 Calculus of gallbladder without cholecystitis without obstruction: Secondary | ICD-10-CM | POA: Diagnosis not present

## 2023-08-27 NOTE — H&P (Signed)
Chief Complaint: Follow-up       History of Present Illness: Hunter Sparks is a 77 y.o. male who is seen today as an office consultation at the request of Dr. Seymour Bars for evaluation of Follow-up .   Patient is a 77 year old male who comes in secondary to being in the hospital with cholecystitis, status post cholecystostomy tube.  Patient underwent cholecystostomy tube secondary to recent CVA.   Patient spent several weeks in inpatient rehab.  Currently he is at home.  Patient and his wife state that he is not on any Plavix or aspirin at this time.   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  He sees Dr. Francee Piccolo in [place].   Patient otherwise has been doing well.  Radiology recently changed his drain.  He appears to have multiple gallstones in the gallbladder upon reviewing the imaging.   Patient is interested in having the gallbladder removed.       Review of Systems: A complete review of systems was obtained from the patient.  I have reviewed this information and discussed as appropriate with the patient.  See HPI as well for other ROS.   Review of Systems  Constitutional:  Negative for fever.  HENT:  Negative for congestion.   Eyes:  Negative for blurred vision.  Respiratory:  Negative for cough, shortness of breath and wheezing.   Cardiovascular:  Negative for chest pain and palpitations.  Gastrointestinal:  Negative for heartburn.  Genitourinary:  Negative for dysuria.  Musculoskeletal:  Negative for myalgias.  Skin:  Negative for rash.  Neurological:  Negative for dizziness and headaches.  Psychiatric/Behavioral:  Negative for depression and suicidal ideas.   All other systems reviewed and are negative.       Medical History: Past Medical History      Past Medical History:  Diagnosis Date   Diabetes mellitus without complication (CMS/HHS-HCC)     History of stroke           Problem List  There is no problem list on file for this patient.      Past  Surgical History       Past Surgical History:  Procedure Laterality Date   Quadruple bypass   2016        Allergies      Allergies  Allergen Reactions   Penicillins Rash        Medications Ordered Prior to Encounter        Current Outpatient Medications on File Prior to Visit  Medication Sig Dispense Refill   atorvastatin (LIPITOR) 40 MG tablet Take 40 mg by mouth once daily       levothyroxine (SYNTHROID) 75 MCG tablet Take by mouth       metoprolol tartrate (LOPRESSOR) 25 MG tablet Take 25 mg by mouth 2 (two) times daily       TOUJEO SOLOSTAR U-300 INSULIN pen injector (concentration 300 units/mL) INJECT 70 UNITS SUBCUTANEOUSLY AT BEDTIME FOR DIABETES        No current facility-administered medications on file prior to visit.        Family History       Family History  Problem Relation Age of Onset   Skin cancer Mother     High blood pressure (Hypertension) Mother     Hyperlipidemia (Elevated cholesterol) Mother          Tobacco Use History  Social History        Tobacco Use  Smoking Status Former  Types: Cigarettes   Start date: 2004  Smokeless Tobacco Never        Social History  Social History         Socioeconomic History   Marital status: Married  Tobacco Use   Smoking status: Former      Types: Cigarettes      Start date: 2004   Smokeless tobacco: Never  Substance and Sexual Activity   Alcohol use: Never   Drug use: Never    Social Drivers of Metallurgist Insecurity: Patient Unable To Answer (07/07/2023)    Received from [facility]    Hunger Vital Sign     Worried About Running Out of Food in the Last Year: Patient unable to answer     Ran Out of Food in the Last Year: Patient unable to answer  Transportation Needs: Patient Unable To Answer (07/07/2023)    Received from [facility] - Transportation     Lack of Transportation (Medical): Patient unable to answer     Lack of Transportation (Non-Medical): Patient  unable to answer        Objective:          Vitals:    08/27/23 1106 08/27/23 1107  BP: (!) 146/70    Pulse: 73    Temp: 37.1 C (98.7 F)    SpO2: 98%    Weight: 82.6 kg (182 lb)    Height: 182.9 cm (6')    PainSc:     3  PainLoc:   Abdomen    Body mass index is 24.68 kg/m.   Physical Exam Constitutional:      General: He is not in acute distress.    Appearance: Normal appearance.  HENT:     Head: Normocephalic.     Nose: No rhinorrhea.     Mouth/Throat:     Mouth: Mucous membranes are moist.     Pharynx: Oropharynx is clear.  Eyes:     General: No scleral icterus.    Pupils: Pupils are equal, round, and reactive to light.  Cardiovascular:     Rate and Rhythm: Normal rate.     Pulses: Normal pulses.  Pulmonary:     Effort: Pulmonary effort is normal. No respiratory distress.     Breath sounds: No stridor. No wheezing.  Abdominal:     General: Abdomen is flat. There is no distension.     Tenderness: There is no abdominal tenderness. There is no guarding or rebound.     Comments: Drain bilious and in place  Musculoskeletal:        General: Normal range of motion.     Cervical back: Normal range of motion and neck supple.  Skin:    General: Skin is warm and dry.     Capillary Refill: Capillary refill takes less than 2 seconds.     Coloration: Skin is not jaundiced.  Neurological:     General: No focal deficit present.     Mental Status: He is alert and oriented to person, place, and time. Mental status is at baseline.  Psychiatric:        Mood and Affect: Mood normal.        Thought Content: Thought content normal.        Judgment: Judgment normal.        Assessment and Plan:  Diagnoses and all orders for this visit:   Symptomatic cholelithiasis   Cholecystostomy tube dysfunction, initial  encounter     Hunter Sparks is a 77 y.o. male    We will send a note out to Dr. Francee Piccolo for cardiac evaluation.  Also to Dr. Pearlean Brownie his neurologist    We will  proceed to the OR for a lap cholecystectomy. All risks and benefits were discussed with the patient to generally include: infection, bleeding, possible need for post op ERCP, damage to the bile ducts, and bile leak. Alternatives were offered and described.  All questions were answered and the patient voiced understanding of the procedure and wishes to proceed at this point with a laparoscopic cholecystectomy           No follow-ups on file.   Axel Filler, MD, [facility], [place] [facility]

## 2023-08-27 NOTE — Telephone Encounter (Signed)
Pre-operative Risk Assessment    Patient Name: Hunter Sparks  DOB: 1946/02/22 MRN: [id number]      Request for Surgical Clearance    Procedure:   Laparascopic cholecystectomy surgery  Date of Surgery:  Clearance TBD                                 Surgeon:  Axel Filler, MD Surgeon's Group or Practice Name:  [facility] Phone number:  [phone] Fax number:  [phone]   Type of Clearance Requested:   - Medical  - Pharmacy:  Hold if applicable      Type of Anesthesia:  General    Additional requests/questions:    SignedSeymour Bars   08/27/2023, 12:52 PM

## 2023-08-27 NOTE — Telephone Encounter (Signed)
Primary Cardiologist:Branch, Christiane Ha, MD  Chart reviewed as part of pre-operative protocol coverage. Because of Hunter Sparks past medical history and time since last visit, he/she will require a follow-up visit in order to better assess preoperative cardiovascular risk.  Pre-op covering staff: - Please schedule appointment and call patient to inform them. - Please contact requesting surgeon's office via preferred method (i.e, phone, fax) to inform them of need for appointment prior to surgery.    Levi Aland, NP-C  08/27/2023, 1:26 PM [address] Office [phone] Fax [phone]

## 2023-08-27 NOTE — Telephone Encounter (Signed)
Patient is scheduled for 10/17 in [place] with Hunter Levering, NP for pre-op clearance. I will route updates to requesting provider's office via EPIC.

## 2023-08-27 NOTE — H&P (View-Only) (Signed)
Chief Complaint: Follow-up       History of Present Illness: Hunter Sparks is a 77 y.o. male who is seen today as an office consultation at the request of Dr. Seymour Bars for evaluation of Follow-up .   Patient is a 77 year old male who comes in secondary to being in the hospital with cholecystitis, status post cholecystostomy tube.  Patient underwent cholecystostomy tube secondary to recent CVA.   Patient spent several weeks in inpatient rehab.  Currently he is at home.  Patient and his wife state that he is not on any Plavix or aspirin at this time.   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  He sees Dr. Francee Piccolo in [place].   Patient otherwise has been doing well.  Radiology recently changed his drain.  He appears to have multiple gallstones in the gallbladder upon reviewing the imaging.   Patient is interested in having the gallbladder removed.       Review of Systems: A complete review of systems was obtained from the patient.  I have reviewed this information and discussed as appropriate with the patient.  See HPI as well for other ROS.   Review of Systems  Constitutional:  Negative for fever.  HENT:  Negative for congestion.   Eyes:  Negative for blurred vision.  Respiratory:  Negative for cough, shortness of breath and wheezing.   Cardiovascular:  Negative for chest pain and palpitations.  Gastrointestinal:  Negative for heartburn.  Genitourinary:  Negative for dysuria.  Musculoskeletal:  Negative for myalgias.  Skin:  Negative for rash.  Neurological:  Negative for dizziness and headaches.  Psychiatric/Behavioral:  Negative for depression and suicidal ideas.   All other systems reviewed and are negative.       Medical History: Past Medical History      Past Medical History:  Diagnosis Date   Diabetes mellitus without complication (CMS/HHS-HCC)     History of stroke           Problem List  There is no problem list on file for this patient.      Past  Surgical History       Past Surgical History:  Procedure Laterality Date   Quadruple bypass   2016        Allergies      Allergies  Allergen Reactions   Penicillins Rash        Medications Ordered Prior to Encounter        Current Outpatient Medications on File Prior to Visit  Medication Sig Dispense Refill   atorvastatin (LIPITOR) 40 MG tablet Take 40 mg by mouth once daily       levothyroxine (SYNTHROID) 75 MCG tablet Take by mouth       metoprolol tartrate (LOPRESSOR) 25 MG tablet Take 25 mg by mouth 2 (two) times daily       TOUJEO SOLOSTAR U-300 INSULIN pen injector (concentration 300 units/mL) INJECT 70 UNITS SUBCUTANEOUSLY AT BEDTIME FOR DIABETES        No current facility-administered medications on file prior to visit.        Family History       Family History  Problem Relation Age of Onset   Skin cancer Mother     High blood pressure (Hypertension) Mother     Hyperlipidemia (Elevated cholesterol) Mother          Tobacco Use History  Social History        Tobacco Use  Smoking Status Former  Types: Cigarettes   Start date: 2004  Smokeless Tobacco Never        Social History  Social History         Socioeconomic History   Marital status: Married  Tobacco Use   Smoking status: Former      Types: Cigarettes      Start date: 2004   Smokeless tobacco: Never  Substance and Sexual Activity   Alcohol use: Never   Drug use: Never    Social Drivers of Metallurgist Insecurity: Patient Unable To Answer (07/07/2023)    Received from [facility]    Hunger Vital Sign     Worried About Running Out of Food in the Last Year: Patient unable to answer     Ran Out of Food in the Last Year: Patient unable to answer  Transportation Needs: Patient Unable To Answer (07/07/2023)    Received from [facility] - Transportation     Lack of Transportation (Medical): Patient unable to answer     Lack of Transportation (Non-Medical): Patient  unable to answer        Objective:          Vitals:    08/27/23 1106 08/27/23 1107  BP: (!) 146/70    Pulse: 73    Temp: 37.1 C (98.7 F)    SpO2: 98%    Weight: 82.6 kg (182 lb)    Height: 182.9 cm (6')    PainSc:     3  PainLoc:   Abdomen    Body mass index is 24.68 kg/m.   Physical Exam Constitutional:      General: He is not in acute distress.    Appearance: Normal appearance.  HENT:     Head: Normocephalic.     Nose: No rhinorrhea.     Mouth/Throat:     Mouth: Mucous membranes are moist.     Pharynx: Oropharynx is clear.  Eyes:     General: No scleral icterus.    Pupils: Pupils are equal, round, and reactive to light.  Cardiovascular:     Rate and Rhythm: Normal rate.     Pulses: Normal pulses.  Pulmonary:     Effort: Pulmonary effort is normal. No respiratory distress.     Breath sounds: No stridor. No wheezing.  Abdominal:     General: Abdomen is flat. There is no distension.     Tenderness: There is no abdominal tenderness. There is no guarding or rebound.     Comments: Drain bilious and in place  Musculoskeletal:        General: Normal range of motion.     Cervical back: Normal range of motion and neck supple.  Skin:    General: Skin is warm and dry.     Capillary Refill: Capillary refill takes less than 2 seconds.     Coloration: Skin is not jaundiced.  Neurological:     General: No focal deficit present.     Mental Status: He is alert and oriented to person, place, and time. Mental status is at baseline.  Psychiatric:        Mood and Affect: Mood normal.        Thought Content: Thought content normal.        Judgment: Judgment normal.        Assessment and Plan:  Diagnoses and all orders for this visit:   Symptomatic cholelithiasis   Cholecystostomy tube dysfunction, initial  encounter     Hunter Sparks is a 77 y.o. male    We will send a note out to Dr. Francee Piccolo for cardiac evaluation.  Also to Dr. Pearlean Brownie his neurologist    We will  proceed to the OR for a lap cholecystectomy. All risks and benefits were discussed with the patient to generally include: infection, bleeding, possible need for post op ERCP, damage to the bile ducts, and bile leak. Alternatives were offered and described.  All questions were answered and the patient voiced understanding of the procedure and wishes to proceed at this point with a laparoscopic cholecystectomy           No follow-ups on file.   Axel Filler, MD, [facility], [place] [facility]

## 2023-08-28 DIAGNOSIS — Z7985 Long-term (current) use of injectable non-insulin antidiabetic drugs: Secondary | ICD-10-CM | POA: Diagnosis not present

## 2023-08-28 DIAGNOSIS — R410 Disorientation, unspecified: Secondary | ICD-10-CM | POA: Diagnosis not present

## 2023-08-28 DIAGNOSIS — R634 Abnormal weight loss: Secondary | ICD-10-CM | POA: Diagnosis not present

## 2023-08-28 DIAGNOSIS — E1165 Type 2 diabetes mellitus with hyperglycemia: Secondary | ICD-10-CM | POA: Diagnosis not present

## 2023-08-28 DIAGNOSIS — E782 Mixed hyperlipidemia: Secondary | ICD-10-CM | POA: Diagnosis not present

## 2023-08-28 DIAGNOSIS — I69354 Hemiplegia and hemiparesis following cerebral infarction affecting left non-dominant side: Secondary | ICD-10-CM | POA: Diagnosis not present

## 2023-08-28 DIAGNOSIS — Z8673 Personal history of transient ischemic attack (TIA), and cerebral infarction without residual deficits: Secondary | ICD-10-CM | POA: Diagnosis not present

## 2023-08-28 DIAGNOSIS — K829 Disease of gallbladder, unspecified: Secondary | ICD-10-CM | POA: Diagnosis not present

## 2023-08-28 DIAGNOSIS — N3281 Overactive bladder: Secondary | ICD-10-CM | POA: Diagnosis not present

## 2023-08-28 DIAGNOSIS — F05 Delirium due to known physiological condition: Secondary | ICD-10-CM | POA: Diagnosis not present

## 2023-08-28 NOTE — Progress Notes (Unsigned)
Cardiology Clinic Note   Date: 08/30/2023 ID: [name], DOB Jun 04, 1946, MRN [id number]  Primary Cardiologist:  Dina Rich, MD  Patient Profile    Hunter Sparks is a 77 y.o. male who presents to the clinic today for preoperative risk assessment.    Past medical history significant for: CAD. LHC 06/11/2015 (unstable angina): Proximal to mid LAD 95%.  D2 70%.  D3 90%.  Mid LCx 99%.  Distal LCx 40%.  Distal RCA 90%.  CTS consult. CABG x 4 06/14/2015: LIMA to LAD, SVG to D1, SVG to OM 2, SVG to posterior descending. Hypertension. Hyperlipidemia. Statin intolerance secondary to myalgias. Lipid panel 06/21/2023: LDL 165, HDL 33, TG 139, total 226. Carotid artery stenosis. Carotid ultrasound 06/21/2023: Mild to moderate partially calcified plaque left carotid bulb extending to the proximal left ICA.  Minimal plaque at the right ICA origin.  Estimated <50% stenosis bilaterally. CVA. MRI brain/MRA head 06/21/2023: Scattered small acute to early subacute infarcts in the right cerebral hemisphere involving the basal ganglia and frontal, parietal, and occipital cortex in the MCA and MCA/PCA watershed distributions. Multiple additional small remote infarcts and background chronic small-vessel ischemic change as above. 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 Echo 06/22/2023: EF 70 to 75%.  No RWMA.  Grade I DD.  Aortic valve sclerosis/calcification without stenosis.  Negative bubble study. Left hemiparesis. Hypothyroidism. T2DM. CKD stage IIIa.     History of Present Illness    Hunter Sparks is followed by Hunter Sparks for the above outlined history.  In summary, patient was first evaluated by cardiology on 06/11/2015 during hospital admission for chest pain.  He described a  22-month history of progressive chest discomfort that had recently become more intense with less exertion to the point that basic activities caused central chest pressure that resolved with rest.  He also reported easy fatigability.  Hunter Sparks was negative x 3.  Given symptoms and history of diabetes he was taken to the Cath Lab for [facility] which revealed severe three-vessel CAD involving proximal LAD, mid LCx, and distal RCA.  Cardiothoracic surgery was consulted and patient underwent CABG x 4.  Echo showed normal LV function with Grade I DD.  Patient was evaluated by Hunter Givens, NP on 03/18/2021 with complaints of bilateral calf claudication.  He underwent arterial ultrasound of bilateral lower extremities which was negative for arterial disease.    On 06/21/2023 patient presented to the ED via EMS for strokelike symptoms.  Patient presented with left-sided weakness and slurred speech that improved during transport per EMS.  BP 200/81.  Head CT was negative for acute intracranial abnormalities.  MRI/MRA brain/head demonstrated scattered small acute to early subacute acute infarcts right cerebral hemisphere involving the basal ganglia and frontal, parietal, and occipital cortex in the MCA and MCA/PCA watershed distributions.  Echo showed normal LV function, Grade I DD, no significant valvular abnormalities, negative bubble study.  Carotid ultrasound revealed mild carotid artery stenosis.  He was placed on DAPT x 3 weeks then aspirin alone by neurology.  Discharged on 06/23/2023 to inpatient rehab.    On 06/29/2023 patient developed abdominal pain which was initially attributed to constipation but did not resolve with laxatives.  Pain worsened and was associated with nausea and vomiting.  Patient was found to have acute cholecystitis and was admitted to medical  services for IV Rocephin and Flagyl.  Percutaneous cholecystostomy drain placed with plan to remain in place for 6 weeks.  Hospital stay complicated by delirium  and poor p.o. intake.  Patient was discharged back to inpatient rehab on 07/14/2023.  Patient was discharged home from inpatient rehab on 08/04/2023.  Patient is now pending laparoscopic cholecystectomy by Hunter Sparks.  Discussed the use of AI scribe software for clinical note transcription with the patient, who gave verbal consent to proceed.  The patient presents for a preoperative evaluation for gallbladder surgery. Patient denies shortness of breath, dyspnea on exertion, lower extremity edema, orthopnea or PND. No chest pain, pressure, or tightness. No palpitations.  The patient's gallbladder issues have significantly impacted his recovery from a recent stroke, causing a delay in rehabilitation and necessitating a prolonged hospital stay. His main complaint is discomfort from the drain. He is eager to have the gallbladder removed.  The patient's recovery from the stroke has been challenging, with the patient needing to relearn how to walk. He can transition from a wheelchair to a walker with assistance and is receiving home health occupational and physical therapy. However, the patient's progress has been hampered by the gallbladder issues, with the patient feeling unwell due to remaining gallstones. He is independent with personal hygiene including showering independently. He can transfer from the bed to wheelchair independently. He denies chest pain or shortness of breath with ADLs or while working with PT/OT.   The patient also has a history of high cholesterol, which has been difficult to manage due to intolerance to statin drugs and injectable medications. The patient's spouse reports that the patient has severe degenerative bone disease, which is exacerbated by statin drugs, causing significant joint pain. The patient has chosen to discontinue cholesterol medication due to the discomfort. His wife is concerned about this and would like to know if there are any other options.   The patient's  spouse also reports that the patient has difficulty sleeping, often staying awake all night. Patient and wife feel this is related to discomfort from the cholecystostomy tube. His wife also states someone mentioned he could be "sundowning" but she feels this is less of a possibility as he is not sleeping throughout the day and is still staying up all night. His wife also notes that the patient often feels cold, a symptom that has worsened since the stroke.        ROS: All other systems reviewed and are otherwise negative except as noted in History of Present Illness.  Studies Reviewed    EKG Interpretation Date/Time:  Thursday August 30 2023 10:06:59 EDT Ventricular Rate:  65 PR Interval:  128 QRS Duration:  90 QT Interval:  404 QTC Calculation: 420 R Axis:   -4  Text Interpretation: Normal sinus rhythm with sinus arrhythmia Minimal voltage criteria for LVH, may be normal variant ( R in aVL ) When compared with ECG of 08-Jul-2023 14:01, Right bundle branch block is no longer Present Confirmed by Carlos Levering [phone]) on 08/30/2023 10:14:30 AM           Physical Exam    VS:  BP 108/78   Pulse 68   Ht 6' (1.829 m)   Wt 181 lb 12.8 oz (82.5 kg)   SpO2 98%   BMI 24.66 kg/m  , BMI Body mass index is 24.66 kg/m.  GEN: Well nourished, well developed, in no acute distress. Neck: No JVD or carotid bruits. Cardiac:  RRR. No murmurs. No rubs or  gallops.   Respiratory:  Respirations regular and unlabored. Clear to auscultation without rales, wheezing or rhonchi. GI: Soft, nontender, nondistended. Extremities: Radials/DP/PT 2+ and equal bilaterally. No clubbing or cyanosis. No edema.  Skin: Warm and dry, no rash. Neuro: Strength intact.  Assessment & Plan      CAD S/p CABG x 17 June 2015.  Patient denies chest pain, tightness, pressure. -Continue aspirin, metoprolol.  Hypertension BP today 108/78. -Continue metoprolol, losartan.  Hyperlipidemia. LDL August 2024 165,  not at goal.  Patient is intolerant to statins secondary to myalgias.  Discussed pursing a nonstatin medication. He was intolerant to Repatha secondary to myalgias as well. Patient would prefer to not take any medication. Patient's wife is concerned and would like to discuss other options. Shared decision making utilized - will defer this until after gallbladder surgery.   Preoperative cardiovascular risk assessment   Laparoscopic cholecystectomy by Hunter Sparks. According to the RCRI, patient has a 6.6% risk of MACE. Patient reports activity equivalent to >4.0 METS (personal hygiene, independent with transfers from wheelchair to bed, working with PT/OT).  -Based on ACC/AHA guidelines, JAHAZIEL TALMAGE would be at acceptable risk for the planned procedure without further cardiovascular testing.  -Ideally aspirin should be continued without interruption, however if the bleeding risk is too great, aspirin may be held for 5-7 days prior to surgery. Please resume aspirin post operatively when it is felt to be safe from a bleeding standpoint.  Patient is also on aspirin secondary to recent CVA August 2024 and is being followed by neurology.      Disposition: Return in 3-4 months with Dr. Dominga Ferry or sooner as needed.          Signed, Etta Grandchild. Reginal Wojcicki, DNP, NP-C

## 2023-08-30 ENCOUNTER — Ambulatory Visit: Payer: PRIVATE HEALTH INSURANCE | Admitting: Student

## 2023-08-30 ENCOUNTER — Ambulatory Visit: Payer: PPO | Attending: Student | Admitting: Student

## 2023-08-30 ENCOUNTER — Encounter: Payer: Self-pay | Admitting: Student

## 2023-08-30 VITALS — BP 108/78 | HR 68 | Ht 72.0 in | Wt 181.8 lb

## 2023-08-30 DIAGNOSIS — I10 Essential (primary) hypertension: Secondary | ICD-10-CM

## 2023-08-30 DIAGNOSIS — Z01810 Encounter for preprocedural cardiovascular examination: Secondary | ICD-10-CM | POA: Diagnosis not present

## 2023-08-30 DIAGNOSIS — E782 Mixed hyperlipidemia: Secondary | ICD-10-CM | POA: Diagnosis not present

## 2023-08-30 DIAGNOSIS — I2510 Atherosclerotic heart disease of native coronary artery without angina pectoris: Secondary | ICD-10-CM | POA: Diagnosis not present

## 2023-08-30 NOTE — Patient Instructions (Signed)
Medication Instructions:  No changes *If you need a refill on your cardiac medications before your next appointment, please call your pharmacy*   Lab Work: None ordered If you have labs (blood work) drawn today and your tests are completely normal, you will receive your results only by: MyChart Message (if you have MyChart) OR A paper copy in the mail If you have any lab test that is abnormal or we need to change your treatment, we will call you to review the results.   Testing/Procedures: None ordered   Follow-Up: At [facility], you and your health needs are our priority.  As part of our continuing mission to provide you with exceptional heart care, we have created designated Provider Care Teams.  These Care Teams include your primary Cardiologist (physician) and Advanced Practice Providers (APPs -  Physician Assistants and Nurse Practitioners) who all work together to provide you with the care you need, when you need it.  We recommend signing up for the patient portal called "MyChart".  Sign up information is provided on this After Visit Summary.  MyChart is used to connect with patients for Virtual Visits (Telemedicine).  Patients are able to view lab/test results, encounter notes, upcoming appointments, etc.  Non-urgent messages can be sent to your provider as well.   To learn more about what you can do with MyChart, go to [email or web address].    Your next appointment:   3-4 month(s)  Provider:   Dr. Wyline Mood

## 2023-08-31 DIAGNOSIS — I639 Cerebral infarction, unspecified: Secondary | ICD-10-CM | POA: Diagnosis not present

## 2023-09-05 ENCOUNTER — Other Ambulatory Visit (HOSPITAL_COMMUNITY): Payer: Self-pay

## 2023-09-05 ENCOUNTER — Other Ambulatory Visit: Payer: Self-pay | Admitting: Cardiology

## 2023-09-11 ENCOUNTER — Encounter: Payer: Self-pay | Admitting: Neurology

## 2023-09-12 ENCOUNTER — Ambulatory Visit (HOSPITAL_COMMUNITY): Payer: PRIVATE HEALTH INSURANCE

## 2023-09-14 ENCOUNTER — Other Ambulatory Visit: Payer: Self-pay | Admitting: Cardiology

## 2023-09-17 ENCOUNTER — Ambulatory Visit: Payer: PRIVATE HEALTH INSURANCE | Admitting: Nurse Practitioner

## 2023-09-17 ENCOUNTER — Other Ambulatory Visit: Payer: Self-pay

## 2023-09-17 ENCOUNTER — Encounter (HOSPITAL_COMMUNITY): Payer: Self-pay | Admitting: General Surgery

## 2023-09-17 NOTE — Anesthesia Preprocedure Evaluation (Signed)
Anesthesia Evaluation  Patient identified by MRN, date of birth, ID band Patient awake    Reviewed: Allergy & Precautions, NPO status , Patient's Chart, lab work & pertinent test results, reviewed documented beta blocker date and time   History of Anesthesia Complications Negative for: history of anesthetic complications  Airway Mallampati: I  TM Distance: >3 FB Neck ROM: Full    Dental  (+) Edentulous Upper, Edentulous Lower   Pulmonary former smoker   breath sounds clear to auscultation       Cardiovascular hypertension, Pt. on medications and Pt. on home beta blockers (-) angina + CAD and + CABG   Rhythm:Regular Rate:Normal  78/2024 ECHO: EF 70 to 75%.  1. The LV has hyperdynamic function, no regional wall motion abnormalities. Grade I diastolic dysfunction (impaired relaxation).   2. RVF was not well visualized. The right ventricular size is normal.   3. The mitral valve is grossly normal. No evidence of MR. No evidence of mitral stenosis.   4. The aortic valve was not well visualized. Aortic valve regurgitation is not visualized. Aortic valve sclerosis/calcification is present, without any evidence of aortic stenosis.     Neuro/Psych    Depression    CVA (L sided weakness), Residual Symptoms    GI/Hepatic Neg liver ROS,GERD  Medicated and Controlled,,  Endo/Other  diabetes, Insulin DependentHypothyroidism    Renal/GU Renal InsufficiencyRenal disease     Musculoskeletal  (+) Arthritis ,    Abdominal   Peds  Hematology Hb 12.9   Anesthesia Other Findings   Reproductive/Obstetrics                             Anesthesia Physical Anesthesia Plan  ASA: 3  Anesthesia Plan: General   Post-op Pain Management: Ofirmev IV (intra-op)*   Induction: Intravenous  PONV Risk Score and Plan: 2 and Ondansetron and Dexamethasone  Airway Management Planned: Oral ETT  Additional Equipment:  None  Intra-op Plan:   Post-operative Plan: Extubation in OR  Informed Consent: I have reviewed the patients History and Physical, chart, labs and discussed the procedure including the risks, benefits and alternatives for the proposed anesthesia with the patient or authorized representative who has indicated his/her understanding and acceptance.       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  On 06/21/2023 patient presented to the ED via EMS for strokelike symptoms.  Patient presented with left-sided weakness and slurred speech that improved during transport per EMS.  BP 200/81.  Head CT was negative for acute intracranial abnormalities.  MRI/MRA brain/head demonstrated scattered small acute to early subacute acute infarcts right cerebral hemisphere involving the basal ganglia and frontal, parietal, and occipital cortex in the MCA and MCA/PCA watershed distributions.  Echo showed normal LV function, Grade I DD, no significant valvular abnormalities, negative bubble study.  Carotid ultrasound revealed mild carotid artery stenosis.  He was placed on DAPT x 3 weeks then aspirin alone by neurology.  Discharged on 06/23/2023 to inpatient rehab.    On 06/29/2023 patient developed abdominal pain which was initially attributed to constipation but did not resolve with laxatives.  Pain worsened and was associated with nausea and vomiting.  Patient was found to have acute cholecystitis and was admitted to medical services  for IV Rocephin and Flagyl.  Percutaneous cholecystostomy drain placed with plan to remain in place for 6 weeks.  Hospital stay complicated by delirium and poor p.o. intake.  Patient was discharged back to inpatient rehab on 07/14/2023.  Patient was discharged home from  inpatient rehab on 08/04/2023.  Seen by cardiology APP Carlos Levering, NP on 08/30/2023 for preop eval.  Per note, "Preoperative cardiovascular risk assessment  Laparoscopic cholecystectomy by Dr. Derrell Lolling. According to the RCRI, patient has a 6.6% risk of MACE. Patient reports activity equivalent to >4.0 METS (personal hygiene, independent with transfers from wheelchair to bed, working with PT/OT). -Based on ACC/AHA guidelines, ADVAITH LAMARQUE would be at acceptable risk for the planned procedure without further cardiovascular testing.  -Ideally aspirin should be continued without interruption, however if the bleeding risk is too great, aspirin may be held for 5-7 days prior to surgery. Please resume aspirin post operatively when it is felt to be safe from a bleeding standpoint.  Patient is also on aspirin secondary to recent CVA August 2024 and is being followed by neurology."  Patient was also cleared by PCP Alphonzo Grieve, NP and note dated 09/05/2023 stating, "I am writing on behalf of Junaid Wurzer to inform your facility the patient is medically cleared for laparoscopic cholecystectomy under general anesthesia."  I received a call from [facility] surgery stating the patient is having complications with his percutaneous cholecystostomy drain and wants to move forward with cholecystectomy as soon as possible.  He has been seen and cleared by cardiology as well as his PCP.  He has not yet been seen in outpatient follow-up by neurology, this is scheduled for 10/25/2023.  I discussed his case with anesthesiologist Dr. Ace Gins.  He advised that since patient has been stable since discharge and has been evaluated by cardiology as well as PCP, he can proceed as planned barring acute status change.  He will need day of surgery labs and evaluation.  EKG 08/30/2023: Normal sinus rhythm with sinus arrhythmia. Rate 65. Minimal voltage criteria for LVH, may be normal variant  TTE 06/22/2023: 1. Left  ventricular ejection fraction, by estimation, is 70 to 75%. The  left ventricle has hyperdynamic function. The left ventricle has no  regional wall motion abnormalities. Left ventricular diastolic parameters  are consistent with Grade I diastolic  dysfunction (impaired relaxation).  2. Right ventricular systolic function was not well visualized. The right  ventricular size is normal.  3. The mitral valve is grossly normal. No evidence of mitral valve  regurgitation. No evidence of mitral stenosis.  4. The aortic valve was not well visualized. Aortic valve regurgitation  is not visualized. Aortic valve sclerosis/calcification is present,  without any evidence of aortic stenosis.  5. Agitated saline contrast bubble study was negative, with no evidence  of any interatrial shunt.   )        Anesthesia Quick Evaluation

## 2023-09-17 NOTE — Progress Notes (Signed)
I spoke with Eyvonne Mechanic, Reita Cliche Pearcy's wife and designated party release.  Mrs Perrelli. Mrs. Burgeson states that Mr. Hessel has not had any complaints of chest pain or shortness of breath.  Mrs. Gloss denies having any s/s of Covid in her household, also denies any known exposure to Covid and denies  any s/s of upper or lower respiratory infection, in the past 8 weeks, for her husband.  Mr. Flaum PCP is Dr. Nita Sells, cardiologist is J. Wyline Mood. Mrs. Molinelli states that Jonny Ruiz has not seen a neurologist since he was discharged; only saw 1 time via video while in the hospital.  Mr. Woehler has type II diabetes, Mrs Moradi states that fasting CBGs run 80-100.  I instructed Ms. Mcguire that Mr. Madlock should take 1/2 of Toujeo at hs tonight- 35 units. I instructed patient to check CBG after awaking and every 2 hours until arrival  to the hospital. I Instructed mr. Markman to have Mr. Yadav check CBG in am when he awakens and every 2 hours until he leaves to come to the hospital. If CBG is less than 70 to take 4 Glucose Tablets or 1 tube of Glucose Gel or 1/2 cup of a clear juice. Recheck CBG in 15 minutes if CBG is not over 70 call, pre- op desk at [phone] for further instructions.

## 2023-09-17 NOTE — Progress Notes (Signed)
Anesthesia Chart Review: Same-day workup  77 year old male with pertinent history including CAD s/p CABG x4 2016, HTN, HLD, IDDM2 (A1c 8.8 on 06/21/2023), former smoker (90 pack years, quit 2005), hypothyroid.  On 06/21/2023 patient presented to the ED via EMS for strokelike symptoms.  Patient presented with left-sided weakness and slurred speech that improved during transport per EMS.  BP 200/81.  Head CT was negative for acute intracranial abnormalities.  MRI/MRA brain/head demonstrated scattered small acute to early subacute acute infarcts right cerebral hemisphere involving the basal ganglia and frontal, parietal, and occipital cortex in the MCA and MCA/PCA watershed distributions.  Echo showed normal LV function, Grade I DD, no significant valvular abnormalities, negative bubble study.  Carotid ultrasound revealed mild carotid artery stenosis.  He was placed on DAPT x 3 weeks then aspirin alone by neurology.  Discharged on 06/23/2023 to inpatient rehab.     On 06/29/2023 patient developed abdominal pain which was initially attributed to constipation but did not resolve with laxatives.  Pain worsened and was associated with nausea and vomiting.  Patient was found to have acute cholecystitis and was admitted to medical services for IV Rocephin and Flagyl.  Percutaneous cholecystostomy drain placed with plan to remain in place for 6 weeks.  Hospital stay complicated by delirium and poor p.o. intake.  Patient was discharged back to inpatient rehab on 07/14/2023.  Patient was discharged home from inpatient rehab on 08/04/2023.  Seen by cardiology APP Hunter Levering, NP on 08/30/2023 for preop eval.  Per note, "Preoperative cardiovascular risk assessment  Laparoscopic cholecystectomy by Dr. Derrell Lolling. According to the RCRI, patient has a 6.6% risk of MACE. Patient reports activity equivalent to >4.0 METS (personal hygiene, independent with transfers from wheelchair to bed, working with PT/OT). -Based on ACC/AHA  guidelines, Hunter Sparks would be at acceptable risk for the planned procedure without further cardiovascular testing.  -Ideally aspirin should be continued without interruption, however if the bleeding risk is too great, aspirin may be held for 5-7 days prior to surgery. Please resume aspirin post operatively when it is felt to be safe from a bleeding standpoint.  Patient is also on aspirin secondary to recent CVA August 2024 and is being followed by neurology."  Patient was also cleared by PCP Alphonzo Grieve, NP and note dated 09/05/2023 stating, "I am writing on behalf of Hunter Sparks to inform your facility the patient is medically cleared for laparoscopic cholecystectomy under general anesthesia."  I received a call from [facility] surgery stating the patient is having complications with his percutaneous cholecystostomy drain and wants to move forward with cholecystectomy as soon as possible.  He has been seen and cleared by cardiology as well as his PCP.  He has not yet been seen in outpatient follow-up by neurology, this is scheduled for 10/25/2023.  I discussed his case with anesthesiologist Dr. Ace Gins.  He advised that since patient has been stable since discharge and has been evaluated by cardiology as well as PCP, he can proceed as planned barring acute status change.  He will need day of surgery labs and evaluation.  EKG 08/30/2023: Normal sinus rhythm with sinus arrhythmia. Rate 65. Minimal voltage criteria for LVH, may be normal variant  TTE 06/22/2023:  1. Left ventricular ejection fraction, by estimation, is 70 to 75%. The  left ventricle has hyperdynamic function. The left ventricle has no  regional wall motion abnormalities. Left ventricular diastolic parameters  are consistent with Grade I diastolic  dysfunction (impaired relaxation).   2.  Right ventricular systolic function was not well visualized. The right  ventricular size is normal.   3. The mitral valve is grossly  normal. No evidence of mitral valve  regurgitation. No evidence of mitral stenosis.   4. The aortic valve was not well visualized. Aortic valve regurgitation  is not visualized. Aortic valve sclerosis/calcification is present,  without any evidence of aortic stenosis.   5. Agitated saline contrast bubble study was negative, with no evidence  of any interatrial shunt.      Zannie Cove [facility] Short Stay Center/Anesthesiology Phone [phone] 09/17/2023 9:56 AM

## 2023-09-18 ENCOUNTER — Encounter (HOSPITAL_COMMUNITY): Payer: Self-pay | Admitting: General Surgery

## 2023-09-18 ENCOUNTER — Other Ambulatory Visit: Payer: Self-pay

## 2023-09-18 ENCOUNTER — Ambulatory Visit (HOSPITAL_BASED_OUTPATIENT_CLINIC_OR_DEPARTMENT_OTHER): Payer: Self-pay | Admitting: Physician Assistant

## 2023-09-18 ENCOUNTER — Ambulatory Visit (HOSPITAL_COMMUNITY): Payer: Self-pay | Admitting: Physician Assistant

## 2023-09-18 ENCOUNTER — Encounter (HOSPITAL_COMMUNITY)
Admission: RE | Disposition: A | Discharge status: Home / Self Care | Payer: Self-pay | Source: Home / Self Care | Attending: General Surgery

## 2023-09-18 ENCOUNTER — Ambulatory Visit (HOSPITAL_COMMUNITY)
Admission: RE | Admit: 2023-09-18 | Discharge: 2023-09-18 | Disposition: A | Discharge status: Home / Self Care | Payer: PPO | Attending: General Surgery | Admitting: General Surgery

## 2023-09-18 DIAGNOSIS — I2510 Atherosclerotic heart disease of native coronary artery without angina pectoris: Secondary | ICD-10-CM | POA: Diagnosis not present

## 2023-09-18 DIAGNOSIS — Z87891 Personal history of nicotine dependence: Secondary | ICD-10-CM | POA: Insufficient documentation

## 2023-09-18 DIAGNOSIS — Z79899 Other long term (current) drug therapy: Secondary | ICD-10-CM | POA: Diagnosis not present

## 2023-09-18 DIAGNOSIS — Z8673 Personal history of transient ischemic attack (TIA), and cerebral infarction without residual deficits: Secondary | ICD-10-CM | POA: Insufficient documentation

## 2023-09-18 DIAGNOSIS — K8010 Calculus of gallbladder with chronic cholecystitis without obstruction: Secondary | ICD-10-CM | POA: Diagnosis not present

## 2023-09-18 DIAGNOSIS — K219 Gastro-esophageal reflux disease without esophagitis: Secondary | ICD-10-CM | POA: Diagnosis not present

## 2023-09-18 DIAGNOSIS — I10 Essential (primary) hypertension: Secondary | ICD-10-CM | POA: Diagnosis not present

## 2023-09-18 DIAGNOSIS — E119 Type 2 diabetes mellitus without complications: Secondary | ICD-10-CM | POA: Insufficient documentation

## 2023-09-18 DIAGNOSIS — Z951 Presence of aortocoronary bypass graft: Secondary | ICD-10-CM | POA: Insufficient documentation

## 2023-09-18 DIAGNOSIS — K8012 Calculus of gallbladder with acute and chronic cholecystitis without obstruction: Secondary | ICD-10-CM | POA: Diagnosis not present

## 2023-09-18 DIAGNOSIS — Z7985 Long-term (current) use of injectable non-insulin antidiabetic drugs: Secondary | ICD-10-CM | POA: Insufficient documentation

## 2023-09-18 DIAGNOSIS — Z794 Long term (current) use of insulin: Secondary | ICD-10-CM | POA: Diagnosis not present

## 2023-09-18 DIAGNOSIS — E785 Hyperlipidemia, unspecified: Secondary | ICD-10-CM | POA: Diagnosis not present

## 2023-09-18 HISTORY — DX: Personal history of urinary calculi: Z87.442

## 2023-09-18 HISTORY — PX: CHOLECYSTECTOMY: SHX55

## 2023-09-18 HISTORY — DX: Atherosclerotic heart disease of native coronary artery without angina pectoris: I25.10

## 2023-09-18 HISTORY — DX: Cerebral infarction, unspecified: I63.9

## 2023-09-18 LAB — POCT I-STAT, CHEM 8
BUN: 18 mg/dL (ref 8–23)
Calcium, Ion: 1.19 mmol/L (ref 1.15–1.40)
Chloride: 104 mmol/L (ref 98–111)
Creatinine, Ser: 1.2 mg/dL (ref 0.61–1.24)
Glucose, Bld: 110 mg/dL — ABNORMAL HIGH (ref 70–99)
HCT: 38 % — ABNORMAL LOW (ref 39.0–52.0)
Hemoglobin: 12.9 g/dL — ABNORMAL LOW (ref 13.0–17.0)
Potassium: 4.2 mmol/L (ref 3.5–5.1)
Sodium: 142 mmol/L (ref 135–145)
TCO2: 27 mmol/L (ref 22–32)

## 2023-09-18 LAB — GLUCOSE, CAPILLARY
Glucose-Capillary: 104 mg/dL — ABNORMAL HIGH (ref 70–99)
Glucose-Capillary: 215 mg/dL — ABNORMAL HIGH (ref 70–99)

## 2023-09-18 SURGERY — LAPAROSCOPIC CHOLECYSTECTOMY
Anesthesia: General | Site: Abdomen

## 2023-09-18 MED ORDER — HYDROMORPHONE HCL 1 MG/ML IJ SOLN
INTRAMUSCULAR | Status: AC
  Filled 2023-09-18: qty 1

## 2023-09-18 MED ORDER — LIDOCAINE 2% (20 MG/ML) 5 ML SYRINGE
INTRAMUSCULAR | Status: DC | PRN
  Administered 2023-09-18: 50 mg via INTRAVENOUS

## 2023-09-18 MED ORDER — OXYCODONE HCL 5 MG/5ML PO SOLN
5.0000 mg | Freq: Once | ORAL | Status: DC | PRN
Start: 2023-09-18 — End: 2023-09-18

## 2023-09-18 MED ORDER — BUPIVACAINE HCL 0.25 % IJ SOLN
INTRAMUSCULAR | Status: DC | PRN
  Administered 2023-09-18: 9 mL

## 2023-09-18 MED ORDER — EPHEDRINE SULFATE-NACL 50-0.9 MG/10ML-% IV SOSY
PREFILLED_SYRINGE | INTRAVENOUS | Status: DC | PRN
  Administered 2023-09-18 (×2): 10 mg via INTRAVENOUS

## 2023-09-18 MED ORDER — DEXAMETHASONE SODIUM PHOSPHATE 10 MG/ML IJ SOLN
INTRAMUSCULAR | Status: DC | PRN
  Administered 2023-09-18: 10 mg via INTRAVENOUS

## 2023-09-18 MED ORDER — BUPIVACAINE HCL (PF) 0.25 % IJ SOLN
INTRAMUSCULAR | Status: AC
  Filled 2023-09-18: qty 30

## 2023-09-18 MED ORDER — CHLORHEXIDINE GLUCONATE CLOTH 2 % EX PADS: 6.0000 | MEDICATED_PAD | Freq: Once | CUTANEOUS | Status: DC

## 2023-09-18 MED ORDER — ACETAMINOPHEN 500 MG PO TABS
1000.0000 mg | ORAL_TABLET | ORAL | Status: AC
  Administered 2023-09-18: 1000 mg via ORAL
  Filled 2023-09-18: qty 2

## 2023-09-18 MED ORDER — HYDROMORPHONE HCL 1 MG/ML IJ SOLN
0.2500 mg | INTRAMUSCULAR | Status: DC | PRN
  Administered 2023-09-18 (×2): 0.25 mg via INTRAVENOUS

## 2023-09-18 MED ORDER — ROCURONIUM BROMIDE 10 MG/ML (PF) SYRINGE
PREFILLED_SYRINGE | INTRAVENOUS | Status: DC | PRN
  Administered 2023-09-18: 60 mg via INTRAVENOUS
  Administered 2023-09-18: 10 mg via INTRAVENOUS

## 2023-09-18 MED ORDER — CHLORHEXIDINE GLUCONATE 0.12 % MT SOLN
15.0000 mL | Freq: Once | OROMUCOSAL | Status: AC
  Administered 2023-09-18: 15 mL via OROMUCOSAL
  Filled 2023-09-18: qty 15

## 2023-09-18 MED ORDER — ROCURONIUM BROMIDE 10 MG/ML (PF) SYRINGE
PREFILLED_SYRINGE | INTRAVENOUS | Status: AC
  Filled 2023-09-18: qty 10

## 2023-09-18 MED ORDER — MIDAZOLAM HCL 2 MG/2ML IJ SOLN: 0.5000 mg | Freq: Once | INTRAMUSCULAR | Status: DC | PRN

## 2023-09-18 MED ORDER — HYDRALAZINE HCL 20 MG/ML IJ SOLN
INTRAMUSCULAR | Status: DC | PRN
  Administered 2023-09-18: 10 mg via INTRAVENOUS

## 2023-09-18 MED ORDER — ONDANSETRON HCL 4 MG/2ML IJ SOLN
INTRAMUSCULAR | Status: DC | PRN
  Administered 2023-09-18: 4 mg via INTRAVENOUS

## 2023-09-18 MED ORDER — HYDRALAZINE HCL 20 MG/ML IJ SOLN
INTRAMUSCULAR | Status: AC
  Filled 2023-09-18: qty 1

## 2023-09-18 MED ORDER — PROPOFOL 10 MG/ML IV BOLUS
INTRAVENOUS | Status: DC | PRN
  Administered 2023-09-18: 150 mg via INTRAVENOUS
  Administered 2023-09-18: 50 mg via INTRAVENOUS

## 2023-09-18 MED ORDER — LACTATED RINGERS IV SOLN: INTRAVENOUS | Status: DC

## 2023-09-18 MED ORDER — FENTANYL CITRATE (PF) 250 MCG/5ML IJ SOLN
INTRAMUSCULAR | Status: AC
  Filled 2023-09-18: qty 5

## 2023-09-18 MED ORDER — ENSURE PRE-SURGERY PO LIQD: 296.0000 mL | Freq: Once | ORAL | Status: DC

## 2023-09-18 MED ORDER — LACTATED RINGERS IV SOLN: INTRAVENOUS | Status: DC | PRN

## 2023-09-18 MED ORDER — DEXAMETHASONE SODIUM PHOSPHATE 10 MG/ML IJ SOLN
INTRAMUSCULAR | Status: AC
  Filled 2023-09-18: qty 1

## 2023-09-18 MED ORDER — MEPERIDINE HCL 25 MG/ML IJ SOLN: 6.2500 mg | INTRAMUSCULAR | Status: DC | PRN

## 2023-09-18 MED ORDER — ONDANSETRON HCL 4 MG/2ML IJ SOLN
INTRAMUSCULAR | Status: AC
  Filled 2023-09-18: qty 2

## 2023-09-18 MED ORDER — SUGAMMADEX SODIUM 200 MG/2ML IV SOLN
INTRAVENOUS | Status: DC | PRN
  Administered 2023-09-18: 300 mg via INTRAVENOUS

## 2023-09-18 MED ORDER — LIDOCAINE 2% (20 MG/ML) 5 ML SYRINGE
INTRAMUSCULAR | Status: AC
  Filled 2023-09-18: qty 5

## 2023-09-18 MED ORDER — PROPOFOL 10 MG/ML IV BOLUS
INTRAVENOUS | Status: AC
  Filled 2023-09-18: qty 20

## 2023-09-18 MED ORDER — SODIUM CHLORIDE 0.9 % IR SOLN
Status: DC | PRN
  Administered 2023-09-18: 1000 mL

## 2023-09-18 MED ORDER — METOPROLOL SUCCINATE ER 25 MG PO TB24
25.0000 mg | ORAL_TABLET | Freq: Once | ORAL | Status: AC
  Administered 2023-09-18: 25 mg via ORAL
  Filled 2023-09-18: qty 1

## 2023-09-18 MED ORDER — OXYCODONE HCL 5 MG PO TABS: 5.0000 mg | ORAL_TABLET | Freq: Once | ORAL | Status: DC | PRN

## 2023-09-18 MED ORDER — FENTANYL CITRATE (PF) 250 MCG/5ML IJ SOLN
INTRAMUSCULAR | Status: DC | PRN
  Administered 2023-09-18: 50 ug via INTRAVENOUS
  Administered 2023-09-18: 100 ug via INTRAVENOUS

## 2023-09-18 MED ORDER — CIPROFLOXACIN IN D5W 400 MG/200ML IV SOLN
400.0000 mg | INTRAVENOUS | Status: AC
Start: 2023-09-18 — End: 2023-09-18
  Administered 2023-09-18: 400 mg via INTRAVENOUS
  Filled 2023-09-18: qty 200

## 2023-09-18 MED ORDER — ORAL CARE MOUTH RINSE: 15.0000 mL | Freq: Once | OROMUCOSAL | Status: AC

## 2023-09-18 SURGICAL SUPPLY — 44 items
ADH SKN CLS APL DERMABOND .7 (GAUZE/BANDAGES/DRESSINGS) ×1
APL PRP STRL LF DISP 70% ISPRP (MISCELLANEOUS) ×1
BAG COUNTER SPONGE SURGICOUNT (BAG) ×2 IMPLANT
BAG SPEC RTRVL 10 TROC 200 (ENDOMECHANICALS) ×1
BAG SPNG CNTER NS LX DISP (BAG) ×1
CANISTER SUCT 3000ML PPV (MISCELLANEOUS) ×2 IMPLANT
CHLORAPREP W/TINT 26 (MISCELLANEOUS) ×2 IMPLANT
CLIP LIGATING HEMO O LOK GREEN (MISCELLANEOUS) ×2 IMPLANT
COVER SURGICAL LIGHT HANDLE (MISCELLANEOUS) ×2 IMPLANT
COVER TRANSDUCER ULTRASND (DRAPES) ×2 IMPLANT
DERMABOND ADVANCED .7 DNX12 (GAUZE/BANDAGES/DRESSINGS) ×2 IMPLANT
DISSECTOR BLUNT TIP ENDO 5MM (MISCELLANEOUS) IMPLANT
ELECT REM PT RETURN 9FT ADLT (ELECTROSURGICAL) ×1
ELECTRODE REM PT RTRN 9FT ADLT (ELECTROSURGICAL) ×2 IMPLANT
ENDOLOOP SUT PDS II 0 18 (SUTURE) IMPLANT
GLOVE BIO SURGEON STRL SZ7.5 (GLOVE) ×4 IMPLANT
GOWN STRL REUS W/ TWL LRG LVL3 (GOWN DISPOSABLE) ×4 IMPLANT
GOWN STRL REUS W/ TWL XL LVL3 (GOWN DISPOSABLE) ×2 IMPLANT
GOWN STRL REUS W/TWL LRG LVL3 (GOWN DISPOSABLE) ×2
GOWN STRL REUS W/TWL XL LVL3 (GOWN DISPOSABLE) ×1
GRASPER SUT TROCAR 14GX15 (MISCELLANEOUS) ×2 IMPLANT
IRRIG SUCT STRYKERFLOW 2 WTIP (MISCELLANEOUS) ×1
IRRIGATION SUCT STRKRFLW 2 WTP (MISCELLANEOUS) ×2 IMPLANT
KIT BASIN OR (CUSTOM PROCEDURE TRAY) ×2 IMPLANT
KIT IMAGING PINPOINTPAQ (MISCELLANEOUS) IMPLANT
KIT TURNOVER KIT B (KITS) ×2 IMPLANT
NDL INSUFFLATION 14GA 120MM (NEEDLE) ×2 IMPLANT
NEEDLE INSUFFLATION 14GA 120MM (NEEDLE) ×1
NS IRRIG 1000ML POUR BTL (IV SOLUTION) ×2 IMPLANT
PAD ARMBOARD 7.5X6 YLW CONV (MISCELLANEOUS) ×2 IMPLANT
POUCH LAPAROSCOPIC INSTRUMENT (MISCELLANEOUS) ×2 IMPLANT
POUCH RETRIEVAL ECOSAC 10 (ENDOMECHANICALS) IMPLANT
SCISSORS LAP 5X35 DISP (ENDOMECHANICALS) ×2 IMPLANT
SET TUBE SMOKE EVAC HIGH FLOW (TUBING) ×2 IMPLANT
SLEEVE Z-THREAD 5X100MM (TROCAR) ×2 IMPLANT
SPECIMEN JAR SMALL (MISCELLANEOUS) ×2 IMPLANT
SUT MNCRL AB 4-0 PS2 18 (SUTURE) ×2 IMPLANT
TOWEL GREEN STERILE (TOWEL DISPOSABLE) ×2 IMPLANT
TOWEL GREEN STERILE FF (TOWEL DISPOSABLE) ×2 IMPLANT
TRAY LAPAROSCOPIC MC (CUSTOM PROCEDURE TRAY) ×2 IMPLANT
TROCAR 11X100 Z THREAD (TROCAR) ×2 IMPLANT
TROCAR Z-THREAD OPTICAL 5X100M (TROCAR) ×2 IMPLANT
WARMER LAPAROSCOPE (MISCELLANEOUS) ×2 IMPLANT
WATER STERILE IRR 1000ML POUR (IV SOLUTION) ×2 IMPLANT

## 2023-09-18 NOTE — Discharge Instructions (Signed)

## 2023-09-18 NOTE — Transfer of Care (Signed)
Immediate Anesthesia Transfer of Care Note  Patient: Hunter Sparks  Procedure(s) Performed: LAPAROSCOPIC CHOLECYSTECTOMY (Abdomen)  Patient Location: PACU  Anesthesia Type:General  Level of Consciousness: awake, alert , and oriented  Airway & Oxygen Therapy: Patient Spontanous Breathing and Patient connected to face mask oxygen  Post-op Assessment: Report given to RN and Post -op Vital signs reviewed and stable  Post vital signs: Reviewed and stable  Last Vitals:  Vitals Value Taken Time  BP 147/63 09/18/23 1122  Temp 97.4   Pulse 86 09/18/23 1126  Resp 16 09/18/23 1126  SpO2 98 % 09/18/23 1126  Vitals shown include unfiled device data.  Last Pain:  Vitals:   09/18/23 0837  TempSrc:   PainSc: 0-No pain      Patients Stated Pain Goal: 0 (09/18/23 0837)  Complications: No notable events documented.

## 2023-09-18 NOTE — Anesthesia Postprocedure Evaluation (Signed)
Anesthesia Post Note  Patient: Hunter Sparks  Procedure(s) Performed: LAPAROSCOPIC CHOLECYSTECTOMY (Abdomen)     Patient location during evaluation: PACU Anesthesia Type: General Level of consciousness: awake and alert, patient cooperative and oriented Pain management: pain level controlled Vital Signs Assessment: post-procedure vital signs reviewed and stable Respiratory status: spontaneous breathing, nonlabored ventilation and respiratory function stable Cardiovascular status: blood pressure returned to baseline and stable Postop Assessment: no apparent nausea or vomiting and able to ambulate Anesthetic complications: no   No notable events documented.  Last Vitals:  Vitals:   09/18/23 1230 09/18/23 1245  BP: (!) 149/57 133/68  Pulse: 78 77  Resp: 15 16  Temp:  36.4 C  SpO2: 93% 93%    Last Pain:  Vitals:   09/18/23 1245  TempSrc:   PainSc: 0-No pain                 Sonnia Strong,E. Malaki Koury

## 2023-09-18 NOTE — Anesthesia Procedure Notes (Signed)
Procedure Name: Intubation Date/Time: 09/18/2023 9:44 AM  Performed by: Pincus Large, CRNAPre-anesthesia Checklist: Patient identified, Emergency Drugs available, Suction available and Patient being monitored Patient Re-evaluated:Patient Re-evaluated prior to induction Oxygen Delivery Method: Circle System Utilized Preoxygenation: Pre-oxygenation with 100% oxygen Induction Type: IV induction Ventilation: Mask ventilation without difficulty Laryngoscope Size: 4 and Mac Grade View: Grade II Tube type: Oral Tube size: 7.5 mm Number of attempts: 1 Airway Equipment and Method: Stylet and Oral airway Placement Confirmation: ETT inserted through vocal cords under direct vision, positive ETCO2 and breath sounds checked- equal and bilateral Secured at: 23 cm Tube secured with: Tape Dental Injury: Teeth and Oropharynx as per pre-operative assessment

## 2023-09-18 NOTE — Op Note (Signed)
09/18/2023  11:09 AM  PATIENT:  Hunter Sparks  77 y.o. male  PRE-OPERATIVE DIAGNOSIS: Chronic cholecystitis, cholecystostomy tube gallstones  POST-OPERATIVE DIAGNOSIS: Chronic cholecystitis, cholecystostomy tube, gallstones  PROCEDURE:  Procedure(s): LAPAROSCOPIC CHOLECYSTECTOMY (N/A)  SURGEON:  Surgeons and Role:    Axel Filler, MD - Primary  ASSISTANTS: Rockwell Germany, RNFA  ANESTHESIA:   local and general  EBL:  25 mL   BLOOD ADMINISTERED:none  DRAINS: none   LOCAL MEDICATIONS USED:  BUPIVICAINE   SPECIMEN:  Source of Specimen: Gallbladder  DISPOSITION OF SPECIMEN:  PATHOLOGY  COUNTS:  YES  TOURNIQUET:  * No tourniquets in log *  DICTATION: .Dragon Dictation  The patient was taken to the operating and placed in the supine position with bilateral SCDs in place.  The patient was prepped and draped in the usual sterile fashion. A time out was called and all facts were verified. A pneumoperitoneum was obtained via A Veress needle technique to a pressure of 14mm of mercury.  A 5mm trochar was then placed in the right upper quadrant under visualization, and there were no injuries to any abdominal organs. A 11 mm port was then placed in the umbilical region after infiltrating with local anesthesia under direct visualization. A second and third epigastric port and right lower quadrant port placement under direct visualization, respectively.    The gallbladder was identified and retracted, the peritoneum was then sharply dissected from the gallbladder and this dissection was carried down to Calot's triangle.  There was some significant inflammation down towards close triangle.  At this time I proceeded with a dome down removal of the gallbladder.  The cholecystostomy tube was encountered.  This was removed in its entirety.  I continue with the dome down portion of the case.  I did get down to the neck of the gallbladder.  There is significant inflammation what seemed to be  in towards the common bile duct.  At this time I transected the neck of the gallbladder.  There is significant amount of stones.  Bowel could be seen coming from the cystic duct.  At this time I was able to dissect the posterior wall of the neck of the gallbladder.  At this time a PDS Endoloop was used x 2 to ligate the neck of the gallbladder.  Initially there was bile seen leaking from the cystic duct.  This ceased after the first Endoloop was placed.  A retrieval bag was then placed in the abdomen and gallbladder placed in the bag.  All stones were placed in the bag as well.  The hepatic fossa was then reexamined and hemostasis was achieved with Bovie cautery and was excellent at the end of the case.   The subhepatic fossa and perihepatic fossa was then irrigated until the effluent was clear.  The gallbladder and bag were removed from the abdominal cavity. The 11 mm trocar fascia was reapproximated with the Endo Close #1 Vicryl 3.  There was a ventral hernia just above the incision site.  This was incorporated into the closure.  The pneumoperitoneum was evacuated and all trochars removed under direct visulalization.  The skin was then closed with 4-0 Monocryl and the skin dressed with Dermabond.    The patient was awaken from general anesthesia and taken to the recovery room in stable condition.   PLAN OF CARE: Discharge to home after PACU  PATIENT DISPOSITION:  PACU - hemodynamically stable.   Delay start of Pharmacological VTE agent (>24hrs) due to surgical blood  loss or risk of bleeding: not applicable

## 2023-09-18 NOTE — Interval H&P Note (Signed)
History and Physical Interval Note:  09/18/2023 9:04 AM  Hunter Sparks  has presented today for surgery, with the diagnosis of gallstones.  The various methods of treatment have been discussed with the patient and family. After consideration of risks, benefits and other options for treatment, the patient has consented to  Procedure(s): LAPAROSCOPIC CHOLECYSTECTOMY (N/A) as a surgical intervention.  The patient's history has been reviewed, patient examined, no change in status, stable for surgery.  I have reviewed the patient's chart and labs.  Questions were answered to the patient's satisfaction.     Axel Filler

## 2023-09-19 ENCOUNTER — Encounter (HOSPITAL_COMMUNITY): Payer: Self-pay | Admitting: General Surgery

## 2023-09-19 LAB — SURGICAL PATHOLOGY

## 2023-10-01 DIAGNOSIS — I639 Cerebral infarction, unspecified: Secondary | ICD-10-CM | POA: Diagnosis not present

## 2023-10-03 DIAGNOSIS — R109 Unspecified abdominal pain: Secondary | ICD-10-CM | POA: Diagnosis not present

## 2023-10-03 DIAGNOSIS — J189 Pneumonia, unspecified organism: Secondary | ICD-10-CM | POA: Diagnosis not present

## 2023-10-18 ENCOUNTER — Other Ambulatory Visit (HOSPITAL_COMMUNITY): Payer: PRIVATE HEALTH INSURANCE

## 2023-10-25 ENCOUNTER — Ambulatory Visit: Payer: PPO | Admitting: Neurology

## 2023-10-31 DIAGNOSIS — I639 Cerebral infarction, unspecified: Secondary | ICD-10-CM | POA: Diagnosis not present

## 2023-12-01 DIAGNOSIS — I639 Cerebral infarction, unspecified: Secondary | ICD-10-CM | POA: Diagnosis not present

## 2023-12-19 ENCOUNTER — Ambulatory Visit: Payer: PRIVATE HEALTH INSURANCE | Admitting: Cardiology

## 2023-12-24 DIAGNOSIS — E039 Hypothyroidism, unspecified: Secondary | ICD-10-CM | POA: Diagnosis not present

## 2023-12-24 DIAGNOSIS — E782 Mixed hyperlipidemia: Secondary | ICD-10-CM | POA: Diagnosis not present

## 2024-01-01 DIAGNOSIS — I639 Cerebral infarction, unspecified: Secondary | ICD-10-CM | POA: Diagnosis not present

## 2024-01-01 DIAGNOSIS — E1122 Type 2 diabetes mellitus with diabetic chronic kidney disease: Secondary | ICD-10-CM | POA: Diagnosis not present

## 2024-01-01 DIAGNOSIS — G3184 Mild cognitive impairment, so stated: Secondary | ICD-10-CM | POA: Diagnosis not present

## 2024-01-01 DIAGNOSIS — F32A Depression, unspecified: Secondary | ICD-10-CM | POA: Diagnosis not present

## 2024-01-01 DIAGNOSIS — R296 Repeated falls: Secondary | ICD-10-CM | POA: Diagnosis not present

## 2024-01-01 DIAGNOSIS — E039 Hypothyroidism, unspecified: Secondary | ICD-10-CM | POA: Diagnosis not present

## 2024-01-01 DIAGNOSIS — E782 Mixed hyperlipidemia: Secondary | ICD-10-CM | POA: Diagnosis not present

## 2024-01-01 DIAGNOSIS — E1165 Type 2 diabetes mellitus with hyperglycemia: Secondary | ICD-10-CM | POA: Diagnosis not present

## 2024-01-01 DIAGNOSIS — N1831 Chronic kidney disease, stage 3a: Secondary | ICD-10-CM | POA: Diagnosis not present

## 2024-01-01 DIAGNOSIS — I25810 Atherosclerosis of coronary artery bypass graft(s) without angina pectoris: Secondary | ICD-10-CM | POA: Diagnosis not present

## 2024-01-29 DIAGNOSIS — I639 Cerebral infarction, unspecified: Secondary | ICD-10-CM | POA: Diagnosis not present

## 2024-02-21 ENCOUNTER — Encounter (HOSPITAL_COMMUNITY): Payer: Self-pay

## 2024-02-21 ENCOUNTER — Ambulatory Visit (HOSPITAL_COMMUNITY): Attending: Nurse Practitioner

## 2024-02-21 ENCOUNTER — Other Ambulatory Visit: Payer: Self-pay

## 2024-02-21 DIAGNOSIS — Z7409 Other reduced mobility: Secondary | ICD-10-CM | POA: Diagnosis not present

## 2024-02-21 DIAGNOSIS — R2689 Other abnormalities of gait and mobility: Secondary | ICD-10-CM | POA: Insufficient documentation

## 2024-02-21 DIAGNOSIS — R531 Weakness: Secondary | ICD-10-CM | POA: Diagnosis not present

## 2024-02-21 NOTE — Therapy (Signed)
 OUTPATIENT PHYSICAL THERAPY BALANCE/VESTIBULAR EVALUATION     Patient Name: Hunter Sparks MRN: [id number] DOB:[birth year], 78 y.o., male Today's Date: 02/22/2024  END OF SESSION:  PT End of Session - 02/21/24 1342     Visit Number 1    Date for PT Re-Evaluation 04/03/24    Authorization Type [employer]    Authorization Time Period no auth    Progress Note Due on Visit 10    PT Start Time 1345    PT Stop Time 1425    PT Time Calculation (min) 40 min    Equipment Utilized During Treatment Gait belt    Activity Tolerance Patient tolerated treatment well    Behavior During Therapy WFL for tasks assessed/performed             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HCC)    Arthritis    "back, hips, legs" (06/10/2015)   Chest pain    Chronic lower back pain    Coronary artery disease    Depression    DJD (degenerative joint disease)    History of kidney stones    Hyperlipidemia 06/11/2015   Hypertension    Hypothyroidism    Stroke (HCC)    Type II diabetes mellitus (HCC)    Past Surgical History:  Procedure Laterality Date   CARDIAC CATHETERIZATION N/A 06/11/2015   Procedure: Left Heart Cath and Coronary Angiography;  Surgeon: Lyn Records, MD;  Location: [facility] INVASIVE CV LAB;  Service: Cardiovascular;  Laterality: N/A;   CHOLECYSTECTOMY N/A 09/18/2023   Procedure: LAPAROSCOPIC CHOLECYSTECTOMY;  Surgeon: Axel Filler, MD;  Location: [facility] OR;  Service: General;  Laterality: N/A;   COLONOSCOPY  06/21/2011   Procedure: COLONOSCOPY;  Surgeon: Malissa Hippo, MD;  Location: [facility] ENDO SUITE;  Service: Endoscopy;  Laterality: N/A;   COLONOSCOPY     COLONOSCOPY N/A 09/20/2017   Procedure: COLONOSCOPY;  Surgeon: Malissa Hippo, MD;  Location: [facility] ENDO SUITE;  Service: Endoscopy;  Laterality: N/A;  1200   CORONARY ARTERY BYPASS GRAFT N/A 06/14/2015   Procedure: CORONARY ARTERY BYPASS GRAFTING times four using Left Internal mammary artery and right  leg Saphenous vein graft;  Surgeon: Kerin Perna, MD;  Location: [facility] OR;  Service: Open Heart Surgery;  Laterality: N/A;   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   HAND SURGERY     IR EXCHANGE BILIARY DRAIN  07/31/2023   IR EXCHANGE BILIARY DRAIN  08/23/2023   IR PERC CHOLECYSTOSTOMY  07/03/2023   SKIN CANCER EXCISION Left    "forearm"   TEE WITHOUT CARDIOVERSION N/A 06/14/2015   Procedure: TRANSESOPHAGEAL ECHOCARDIOGRAM (TEE);  Surgeon: Kerin Perna, MD;  Location: [facility] OR;  Service: Open Heart Surgery;  Laterality: N/A;   Patient Active Problem List   Diagnosis Date Noted   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) 07/31/2023   Urinary incontinence 07/27/2023   Acute CVA (cerebrovascular accident) (HCC) 07/14/2023   Protein-calorie malnutrition, severe 07/07/2023   Cholecystitis 07/03/2023   Moderate cognitive impairment 07/02/2023   Acute abdominal pain 07/02/2023   Sepsis with acute organ dysfunction without septic shock (HCC) 07/02/2023   Hemiparesis affecting left side as late effect of cerebrovascular accident (CVA) (HCC) 07/02/2023   Cerebral ischemic stroke due to global hypoperfusion with watershed infarct [facility]) 06/23/2023   Stroke (cerebrum) (HCC) 06/22/2023   Left-sided weakness 06/21/2023   CKD stage 3a, GFR 45-59 ml/min (HCC) - Baseline scr 1.3-1.5 06/21/2023  Major depressive disorder 08/08/2021   Hx of colonic polyps 06/13/2017   Family hx of colon cancer 06/13/2017   Vitamin D insufficiency 01/04/2017   Insulin dependent type 2 diabetes mellitus (HCC) 09/01/2015   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   Mixed hyperlipidemia 06/11/2015   Essential hypertension    Coronary artery disease involving native coronary artery of native heart with unstable angina pectoris (HCC)    Angina, class II (HCC) 06/10/2015   Hypothyroidism 06/10/2015   History of tobacco use 06/10/2015   Chronic low back pain 06/10/2015     PCP: Benita Stabile, MD  REFERRING PROVIDER: Micael Hampshire, FNP  REFERRING DIAG: R29.6 (ICD-10-CM) - Repeated falls  THERAPY DIAG:  Other abnormalities of gait and mobility  Impaired functional mobility, balance, and endurance  Left-sided weakness  ONSET DATE: 8 months ago, following stroke  Rationale for Evaluation and Treatment: Rehabilitation  SUBJECTIVE:   SUBJECTIVE STATEMENT: Pt reports pain rating of 7-8/10 upon presentation in low back. Pts wife joins in subjective section with pt showing decreased memory of his past medical history. Pt reported to have severe degenerative bone disease of back, had to retire because of back pain. Pt suffered a stroke 8 months ago, and left side weakness and decreased walking ability were noticed and spent some time in the inpatient setting for recovery. Pt then had at least one sx concerning gall bladder, September 21st. Pt is reported to get confused on occasion and has gotten slower getting up and down, somedays are better than others. Pt accompanied by: significant other  PERTINENT HISTORY:  -History of stroke -Quadruple bypass 2017? -Gall bladder procedure (s)  PAIN:  Are you having pain? Yes: NPRS scale: 7-8/10 Pain location: low back Pain description: aching Aggravating factors: standing, sitting in one position Relieving factors: lying down  PRECAUTIONS: Fall  RED FLAGS: None More frequent reported  WEIGHT BEARING RESTRICTIONS: No  FALLS: Has patient fallen in last 6 months? Yes. Number of falls 1-2  LIVING ENVIRONMENT: Lives with: lives with their spouse Lives in: House/apartment Stairs: No, ramp Has following equipment at home: Walker - 2 wheeled  PLOF: Independent with basic ADLs  PATIENT GOALS: walk better, get back on tractor, lawn mower, normalcy back  OBJECTIVE:  Note: Objective measures were completed at Evaluation unless otherwise noted.  DIAGNOSTIC FINDINGS:  MRI of Brain 07/02/23: CLINICAL  DATA:  Mental status change, unknown cause.   EXAM: MRI HEAD WITHOUT CONTRAST   TECHNIQUE: Multiplanar, multiecho pulse sequences of the brain and surrounding structures were obtained without intravenous contrast.   COMPARISON:  Head MRI and MRA 06/21/2023   FINDINGS: The study is intermittently moderately motion degraded.   Brain: Small acute to early subacute right cerebral hemispheric infarcts described on the prior MRI have evolved in the interim. Mildly restricted diffusion remains at the site of the posterior right basal ganglia infarct. No new infarct, mass, midline shift, or extra-axial fluid collection is identified. Patchy T2 hyperintensities in the cerebral white matter bilaterally are unchanged and nonspecific but compatible with moderate chronic small vessel ischemic disease. Chronic infarcts are again noted in the right basal ganglia, left corona radiata, pons, and both cerebellar hemispheres a chronic microhemorrhage in the right occipital lobe is unchanged. There is moderate cerebral atrophy.   Vascular: Abnormal appearance of the distal left vertebral artery, more fully evaluated on the recent MRA.   Skull and upper cervical spine: No suspicious marrow lesion.   Sinuses/Orbits: Unremarkable orbits. Paranasal sinuses and mastoid air cells  are clear.   Other: None.   IMPRESSION: 1. Interval evolution of the previously described recent infarcts. 2. No new infarct or other acute intracranial abnormality. 3. Moderate chronic small vessel ischemic disease with multiple chronic infarcts as above.  COGNITION: Overall cognitive status: Within functional limits for tasks assessed   SENSATION: Light touch: Impaired    POSTURE:  rounded shoulders, forward head, decreased lumbar lordosis, and increased thoracic kyphosis   LOWER EXTREMITY MMT:   MMT Right eval Left eval  Hip flexion 4 3+  Hip abduction 4 3-  Hip adduction 4 3-  Hip internal rotation     Hip external rotation    Knee flexion 4 3-  Knee extension 4 3  Ankle dorsiflexion 4+ 3+  Ankle plantarflexion    Ankle inversion    Ankle eversion    (Blank rows = not tested)  GAIT: Gait pattern: step to pattern, decreased step length- Right, decreased step length- Left, decreased stance time- Left, decreased stride length, decreased hip/knee flexion- Left, decreased ankle dorsiflexion- Left, and shuffling Distance walked: 80 feet Assistive device utilized: Walker - 2 wheeled Level of assistance: Modified independence Comments: Pt demonstrates decreased progression of gait due to left LE weakness.  FUNCTIONAL TESTS:  5 times sit to stand: 2 in 30 seconds 2 minute walk test: 80 feet with RW RLE balance better during balance assessment PATIENT SURVEYS:  None completed                                                                                                                              TREATMENT DATE:  02/21/2024  Evaluation: -ROM measured, Strength assessed, HEP prescribed, pt educated on prognosis, findings, and importance of HEP compliance if given.    PATIENT EDUCATION: Education details: Pt was educated on findings of PT evaluation, prognosis, frequency of therapy visits and rationale, attendance policy, and HEP if given.   Person educated: Patient and Spouse Education method: Explanation, Verbal cues, and Handouts Education comprehension: verbalized understanding and needs further education  HOME EXERCISE PROGRAM: Access Code: [email or web address] URL: [email or web address] Date: 02/21/2024 Prepared by: Luz Lex  Exercises - Sit to Stand with Armchair  - 1 x daily - 7 x weekly - 2 sets - 10 reps - Standing Hip Extension with Counter Support  - 1 x daily - 7 x weekly - 3 sets - 10 reps - Standing Hip Abduction with Counter Support  - 1 x daily - 7 x weekly - 3 sets - 10 reps  GOALS: Goals reviewed with patient? No  SHORT TERM GOALS: Target date:  03/13/24  Patient will demonstrate evidence of independence with individualized HEP and will report compliance for at least 3 days per week for optimized progression towards remaining therapy goals. Baseline:  Goal status: INITIAL  2.  Patient will report a decrease in pain level during community ambulation by at least 2 points for improved quality of life. Baseline: 7/10 Goal status: INITIAL  LONG TERM GOALS: Target date: 04/03/24  Pt will demonstrate a an increase of at least 10 seconds bilaterally during static tandem stance for decreased falls risk. Baseline: about 2 seconds per side Goal status: INITIAL  2.  Pt will improve 2 MWT by 140 feet in order to demonstrate improved functional ambulatory capacity in community setting.  Baseline: 80 feet Goal status: INITIAL  3.  Pt will demonstrate ability to manage RW in more cautious manner by keeping one foot in walker area during ambulation for decreased anterior lean and decreased UE demand for support during ambulation for improved independence with ambulation. Baseline: currently walker gets away from him and he is heavily dependent on UEs Goal status: INITIAL  4.  Pt will demonstrate at least 4-/5 MMT for left lower extremity for increased strength during ADL and community ambulation. Baseline: see above Goal status: INITIAL   ASSESSMENT:  CLINICAL IMPRESSION: Patient is a 78 y.o. male who was seen today for physical therapy evaluation and treatment for R29.6 (ICD-10-CM) - Repeated falls.   Patient demonstrates decreased LE strength, abnormal pain rating, and impaired balance. Patient also demonstrates difficulty with ambulation during today's session with significant decrease in stride length and velocity noted. Patient also demonstrates left sided weakness especially in LLE compared to the RLE. Patient requires VC for proper management of RW during ambulation and is lacking in ambulation tolerance overall. Patient would  benefit from skilled physical therapy for increased endurance with ambulation, increased LE strength, and balance for improved gait quality, return to higher level of function with ADLs, and progress towards therapy goals.   OBJECTIVE IMPAIRMENTS: Abnormal gait, decreased balance, decreased endurance, decreased knowledge of use of DME, decreased mobility, difficulty walking, decreased strength, and pain.   ACTIVITY LIMITATIONS: carrying, lifting, bending, standing, squatting, stairs, and locomotion level  PARTICIPATION LIMITATIONS: cleaning, medication management, driving, shopping, community activity, occupation, and yard work  PERSONAL FACTORS: Age, Past/current experiences, Time since onset of injury/illness/exacerbation, and 1-2 comorbidities: heart surgery, gall bladder procedure, stroke  are also affecting patient's functional outcome.   REHAB POTENTIAL: Good  CLINICAL DECISION MAKING: Evolving/moderate complexity  EVALUATION COMPLEXITY: Moderate   PLAN:  PT FREQUENCY: 2x/week  PT DURATION: 6 weeks  PLANNED INTERVENTIONS: 97110-Therapeutic exercises, 97530- Therapeutic activity, 97112- Neuromuscular re-education, 97535- Self Care, 78469- Manual therapy, [phone]- Gait training, Patient/Family education, Balance training, and Stair training  PLAN FOR NEXT SESSION: complete DGI or FGA, progress dynamic and static balance, progress bilateral LE strengthening with focus on LLE   Luz Lex, PT, DPT [facility] Office: [phone] 7:51 AM, 02/22/24

## 2024-02-29 DIAGNOSIS — I639 Cerebral infarction, unspecified: Secondary | ICD-10-CM | POA: Diagnosis not present

## 2024-03-04 ENCOUNTER — Ambulatory Visit (HOSPITAL_COMMUNITY): Payer: PRIVATE HEALTH INSURANCE | Admitting: Physical Therapy

## 2024-03-04 DIAGNOSIS — R2689 Other abnormalities of gait and mobility: Secondary | ICD-10-CM

## 2024-03-04 DIAGNOSIS — Z7409 Other reduced mobility: Secondary | ICD-10-CM

## 2024-03-04 DIAGNOSIS — R531 Weakness: Secondary | ICD-10-CM

## 2024-03-04 NOTE — Therapy (Signed)
 OUTPATIENT PHYSICAL THERAPY BALANCE/VESTIBULAR TREATMENT     Patient Name: Hunter Sparks MRN: [id number] DOB:Jul 26, 1946, 78 y.o., male Today's Date: 03/04/2024  END OF SESSION:  PT End of Session - 03/04/24 1023     Visit Number 2    Date for PT Re-Evaluation 04/03/24    Authorization Type [employer]    Authorization Time Period no auth    Progress Note Due on Visit 10    PT Start Time 1022    PT Stop Time 1100    PT Time Calculation (min) 38 min    Equipment Utilized During Treatment Gait belt    Activity Tolerance Patient tolerated treatment well    Behavior During Therapy WFL for tasks assessed/performed             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HCC)    Arthritis    "back, hips, legs" (06/10/2015)   Chest pain    Chronic lower back pain    Coronary artery disease    Depression    DJD (degenerative joint disease)    History of kidney stones    Hyperlipidemia 06/11/2015   Hypertension    Hypothyroidism    Stroke (HCC)    Type II diabetes mellitus (HCC)    Past Surgical History:  Procedure Laterality Date   CARDIAC CATHETERIZATION N/A 06/11/2015   Procedure: Left Heart Cath and Coronary Angiography;  Surgeon: Arty Binning, MD;  Location: [facility] INVASIVE CV LAB;  Service: Cardiovascular;  Laterality: N/A;   CHOLECYSTECTOMY N/A 09/18/2023   Procedure: LAPAROSCOPIC CHOLECYSTECTOMY;  Surgeon: Shela Derby, MD;  Location: [facility] OR;  Service: General;  Laterality: N/A;   COLONOSCOPY  06/21/2011   Procedure: COLONOSCOPY;  Surgeon: Ruby Corporal, MD;  Location: [facility] SUITE;  Service: Endoscopy;  Laterality: N/A;   COLONOSCOPY     COLONOSCOPY N/A 09/20/2017   Procedure: COLONOSCOPY;  Surgeon: Ruby Corporal, MD;  Location: [facility];  Service: Endoscopy;  Laterality: N/A;  1200   CORONARY ARTERY BYPASS GRAFT N/A 06/14/2015   Procedure: CORONARY ARTERY BYPASS GRAFTING times four using Left Internal mammary artery and right  leg Saphenous vein graft;  Surgeon: Heriberto London, MD;  Location: [facility] OR;  Service: Open Heart Surgery;  Laterality: N/A;   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   HAND SURGERY     IR EXCHANGE BILIARY DRAIN  07/31/2023   IR EXCHANGE BILIARY DRAIN  08/23/2023   IR PERC CHOLECYSTOSTOMY  07/03/2023   SKIN CANCER EXCISION Left    "forearm"   TEE WITHOUT CARDIOVERSION N/A 06/14/2015   Procedure: TRANSESOPHAGEAL ECHOCARDIOGRAM (TEE);  Surgeon: Heriberto London, MD;  Location: 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) 07/31/2023   Urinary incontinence 07/27/2023   Acute CVA (cerebrovascular accident) (HCC) 07/14/2023   Protein-calorie malnutrition, severe 07/07/2023   Cholecystitis 07/03/2023   Moderate cognitive impairment 07/02/2023   Acute abdominal pain 07/02/2023   Sepsis with acute organ dysfunction without septic shock (HCC) 07/02/2023   Hemiparesis affecting left side as late effect of cerebrovascular accident (CVA) (HCC) 07/02/2023   Cerebral ischemic stroke due to global hypoperfusion with watershed infarct [facility]) 06/23/2023   Stroke (cerebrum) (HCC) 06/22/2023   Left-sided weakness 06/21/2023   CKD stage 3a, GFR 45-59 ml/min (HCC) - Baseline scr 1.3-1.5 06/21/2023  Major depressive disorder 08/08/2021   Hx of colonic polyps 06/13/2017   Family hx of colon cancer 06/13/2017   Vitamin D  insufficiency 01/04/2017   Insulin  dependent type 2 diabetes mellitus (HCC) 09/01/2015   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   Mixed hyperlipidemia 06/11/2015   Essential hypertension    Coronary artery disease involving native coronary artery of native heart with unstable angina pectoris (HCC)    Angina, class II (HCC) 06/10/2015   Hypothyroidism 06/10/2015   History of tobacco use 06/10/2015   Chronic low back pain 06/10/2015     PCP: Omie Bickers, MD  REFERRING PROVIDER: Harriet Limber, FNP  REFERRING DIAG: R29.6 (ICD-10-CM) - Repeated falls  THERAPY DIAG:  Other abnormalities of gait and mobility  Impaired functional mobility, balance, and endurance  Left-sided weakness  ONSET DATE: 8 months ago, following stroke  Rationale for Evaluation and Treatment: Rehabilitation  SUBJECTIVE:   SUBJECTIVE STATEMENT: Pt states no pain at rest but increases with walking and is in his Lt hip.  States he thinks it came from where he fell out of the bed several weeks ago.   Also some pain in his LT calf and swelling in foot.  Keeps a patch on his lower back as he has constant pain here. 7/10 in lower back   Evaluation:  Pt reports pain rating of 7-8/10 upon presentation in low back. Pts wife joins in subjective section with pt showing decreased memory of his past medical history. Pt reported to have severe degenerative bone disease of back, had to retire because of back pain. Pt suffered a stroke 8 months ago, and left side weakness and decreased walking ability were noticed and spent some time in the inpatient setting for recovery. Pt then had at least one sx concerning gall bladder, September 21st. Pt is reported to get confused on occasion and has gotten slower getting up and down, somedays are better than others. Pt accompanied by: significant other  PERTINENT HISTORY:  -History of stroke -Quadruple bypass 2017? -Gall bladder procedure (s)  PAIN:  Are you having pain? Yes: NPRS scale: 7-8/10 Pain location: low back Pain description: aching Aggravating factors: standing, sitting in one position Relieving factors: lying down  PRECAUTIONS: Fall  RED FLAGS: None More frequent reported  WEIGHT BEARING RESTRICTIONS: No  FALLS: Has patient fallen in last 6 months? Yes. Number of falls 1-2  LIVING ENVIRONMENT: Lives with: lives with their spouse Lives in: House/apartment Stairs: No, ramp Has following  equipment at home: Walker - 2 wheeled  PLOF: Independent with basic ADLs  PATIENT GOALS: walk better, get back on tractor, lawn mower, normalcy back  OBJECTIVE:  Note: Objective measures were completed at Evaluation unless otherwise noted.  DIAGNOSTIC FINDINGS:  MRI of Brain 07/02/23: CLINICAL DATA:  Mental status change, unknown cause.   EXAM: MRI HEAD WITHOUT CONTRAST   TECHNIQUE: Multiplanar, multiecho pulse sequences of the brain and surrounding structures were obtained without intravenous contrast.   COMPARISON:  Head MRI and MRA 06/21/2023   FINDINGS: The study is intermittently moderately motion degraded.   Brain: Small acute to early subacute right cerebral hemispheric infarcts described on the prior MRI have evolved in the interim. Mildly restricted diffusion remains at the site of the posterior right basal ganglia infarct. No new infarct, mass, midline shift, or extra-axial fluid collection is identified. Patchy T2 hyperintensities in the cerebral white matter bilaterally are unchanged and nonspecific but compatible with moderate chronic small vessel ischemic disease. Chronic infarcts are again noted  in the right basal ganglia, left corona radiata, pons, and both cerebellar hemispheres a chronic microhemorrhage in the right occipital lobe is unchanged. There is moderate cerebral atrophy.   Vascular: Abnormal appearance of the distal left vertebral artery, more fully evaluated on the recent MRA.   Skull and upper cervical spine: No suspicious marrow lesion.   Sinuses/Orbits: Unremarkable orbits. Paranasal sinuses and mastoid air cells are clear.   Other: None.   IMPRESSION: 1. Interval evolution of the previously described recent infarcts. 2. No new infarct or other acute intracranial abnormality. 3. Moderate chronic small vessel ischemic disease with multiple chronic infarcts as above.  COGNITION: Overall cognitive status: Within functional limits for  tasks assessed   SENSATION: Light touch: Impaired    POSTURE:  rounded shoulders, forward head, decreased lumbar lordosis, and increased thoracic kyphosis   LOWER EXTREMITY MMT:   MMT Right eval Left eval  Hip flexion 4 3+  Hip abduction 4 3-  Hip adduction 4 3-  Hip internal rotation    Hip external rotation    Knee flexion 4 3-  Knee extension 4 3  Ankle dorsiflexion 4+ 3+  Ankle plantarflexion    Ankle inversion    Ankle eversion    (Blank rows = not tested)  GAIT: Gait pattern: step to pattern, decreased step length- Right, decreased step length- Left, decreased stance time- Left, decreased stride length, decreased hip/knee flexion- Left, decreased ankle dorsiflexion- Left, and shuffling Distance walked: 80 feet Assistive device utilized: Walker - 2 wheeled Level of assistance: Modified independence Comments: Pt demonstrates decreased progression of gait due to left LE weakness.  FUNCTIONAL TESTS:  5 times sit to stand: 2 in 30 seconds 2 minute walk test: 80 feet with RW RLE balance better during balance assessment PATIENT SURVEYS:  None completed                                                                                                                              TREATMENT DATE:   03/04/24 Sit to stands with 2" foam in seat 5X no UE CGA Seated LAQ 10X5" each LE Supine bridge 2X10  SLR 10X each  LTR 10X  Knee to chest with towel 5X10" each side Goal review, POC moving forward Ambulation around clinic working on heel toe gait (tends to drag Lt foot)  02/21/2024  Evaluation: -ROM measured, Strength assessed, HEP prescribed, pt educated on prognosis, findings, and importance of HEP compliance if given.    PATIENT EDUCATION: Education details: Pt was educated on findings of PT evaluation, prognosis, frequency of therapy visits and rationale, attendance policy, and HEP if given.   Person educated: Patient and Spouse Education method: Explanation,  Verbal cues, and Handouts Education comprehension: verbalized understanding and needs further education  HOME EXERCISE PROGRAM: Access Code: [email or web address] URL: [email or web address] Date: 02/21/2024 Prepared by: Armond Bertin  Exercises - Sit to Stand with Armchair  - 1 x daily - 7 x weekly - 2  sets - 10 reps - Standing Hip Extension with Counter Support  - 1 x daily - 7 x weekly - 3 sets - 10 reps - Standing Hip Abduction with Counter Support  - 1 x daily - 7 x weekly - 3 sets - 10 reps  GOALS: Goals reviewed with patient? Yes  SHORT TERM GOALS: Target date: 03/13/24  Patient will demonstrate evidence of independence with individualized HEP and will report compliance for at least 3 days per week for optimized progression towards remaining therapy goals. Baseline:  Goal status: IN PROGRESS  2.  Patient will report a decrease in pain level during community ambulation by at least 2 points for improved quality of life. Baseline: 7/10 Goal status: IN PROGRESS     LONG TERM GOALS: Target date: 04/03/24  Pt will demonstrate a an increase of at least 10 seconds bilaterally during static tandem stance for decreased falls risk. Baseline: about 2 seconds per side Goal status: IN PROGRESS  2.  Pt will improve 2 MWT by 140 feet in order to demonstrate improved functional ambulatory capacity in community setting.  Baseline: 80 feet Goal status: IN PROGRESS  3.  Pt will demonstrate ability to manage RW in more cautious manner by keeping one foot in walker area during ambulation for decreased anterior lean and decreased UE demand for support during ambulation for improved independence with ambulation. Baseline: currently walker gets away from him and he is heavily dependent on UEs Goal status: IN PROGRESS  4.  Pt will demonstrate at least 4-/5 MMT for left lower extremity for increased strength during ADL and community ambulation. Baseline: see above Goal status: IN  PROGRESS  6 ASSESSMENT:  CLINICAL IMPRESSION: Reviewed goals with pt.  Worked on ambulation as tends to drag his Lt foot and maintain walker too far in front.  Also noted poor safety awareness when transferring not using UE correctly to assure chair is there and using walker fully to seated target.  Progressed LE strengthening/stability exercises today mostly in supine as pt complains of back discomfort.  Unable to maintain full knee extension with SLR with either side, however LT was much weaker than Rt.  Constant verbal and tactile cues as tends to have hard time focusing on task, maintaining correct form and keeping count of therex.   Pt will continue to benefit from skilled therapy.  Evaluation:  Patient is a 78 y.o. male who was seen today for physical therapy evaluation and treatment for R29.6 (ICD-10-CM) - Repeated falls.  Patient demonstrates decreased LE strength, abnormal pain rating, and impaired balance. Patient also demonstrates difficulty with ambulation during today's session with significant decrease in stride length and velocity noted. Patient also demonstrates left sided weakness especially in LLE compared to the RLE. Patient requires VC for proper management of RW during ambulation and is lacking in ambulation tolerance overall. Patient would benefit from skilled physical therapy for increased endurance with ambulation, increased LE strength, and balance for improved gait quality, return to higher level of function with ADLs, and progress towards therapy goals.   OBJECTIVE IMPAIRMENTS: Abnormal gait, decreased balance, decreased endurance, decreased knowledge of use of DME, decreased mobility, difficulty walking, decreased strength, and pain.   ACTIVITY LIMITATIONS: carrying, lifting, bending, standing, squatting, stairs, and locomotion level  PARTICIPATION LIMITATIONS: cleaning, medication management, driving, shopping, community activity, occupation, and yard work  PERSONAL  FACTORS: Age, Past/current experiences, Time since onset of injury/illness/exacerbation, and 1-2 comorbidities: heart surgery, gall bladder procedure, stroke  are also affecting patient's  functional outcome.   REHAB POTENTIAL: Good  CLINICAL DECISION MAKING: Evolving/moderate complexity  EVALUATION COMPLEXITY: Moderate   PLAN:  PT FREQUENCY: 2x/week  PT DURATION: 6 weeks  PLANNED INTERVENTIONS: 97110-Therapeutic exercises, 97530- Therapeutic activity, [id number]- Neuromuscular re-education, 97535- Self Care, [id number]- Manual therapy, [phone]- Gait training, Patient/Family education, Balance training, and Stair training  PLAN FOR NEXT SESSION: complete DGI or FGA when appropriate.  Continue to  progress dynamic and static balance, progress bilateral LE strengthening with focus on LLE   Lorenso Romance, PTA/CLT [facility] Ph: [phone] 10:23 AM, 03/04/24 K.,

## 2024-03-05 ENCOUNTER — Encounter (HOSPITAL_COMMUNITY): Payer: PRIVATE HEALTH INSURANCE

## 2024-03-06 ENCOUNTER — Encounter (HOSPITAL_COMMUNITY): Payer: Self-pay

## 2024-03-06 ENCOUNTER — Ambulatory Visit (HOSPITAL_COMMUNITY)

## 2024-03-06 DIAGNOSIS — Z7409 Other reduced mobility: Secondary | ICD-10-CM

## 2024-03-06 DIAGNOSIS — R2689 Other abnormalities of gait and mobility: Secondary | ICD-10-CM | POA: Diagnosis not present

## 2024-03-06 DIAGNOSIS — R531 Weakness: Secondary | ICD-10-CM

## 2024-03-06 NOTE — Addendum Note (Signed)
 Addended by: Lucia Harm on: 03/06/2024 03:18 PM   Modules accepted: Orders

## 2024-03-06 NOTE — Therapy (Signed)
 OUTPATIENT PHYSICAL THERAPY BALANCE/VESTIBULAR TREATMENT     Patient Name: Hunter Sparks MRN: [id number] DOB:1946-01-01, 78 y.o., male Today's Date: 03/06/2024  END OF SESSION:  PT End of Session - 03/06/24 1256     Visit Number 3    Number of Visits 12    Date for PT Re-Evaluation 04/03/24    Authorization Type [employer]    Authorization Time Period no auth    Progress Note Due on Visit 10    PT Start Time 1300    PT Stop Time 1342    PT Time Calculation (min) 42 min    Equipment Utilized During Treatment Gait belt    Activity Tolerance Patient tolerated treatment well    Behavior During Therapy WFL for tasks assessed/performed             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HCC)    Arthritis    "back, hips, legs" (06/10/2015)   Chest pain    Chronic lower back pain    Coronary artery disease    Depression    DJD (degenerative joint disease)    History of kidney stones    Hyperlipidemia 06/11/2015   Hypertension    Hypothyroidism    Stroke (HCC)    Type II diabetes mellitus (HCC)    Past Surgical History:  Procedure Laterality Date   CARDIAC CATHETERIZATION N/A 06/11/2015   Procedure: Left Heart Cath and Coronary Angiography;  Surgeon: Arty Binning, MD;  Location: [facility] INVASIVE CV LAB;  Service: Cardiovascular;  Laterality: N/A;   CHOLECYSTECTOMY N/A 09/18/2023   Procedure: LAPAROSCOPIC CHOLECYSTECTOMY;  Surgeon: Shela Derby, MD;  Location: [facility] OR;  Service: General;  Laterality: N/A;   COLONOSCOPY  06/21/2011   Procedure: COLONOSCOPY;  Surgeon: Ruby Corporal, MD;  Location: [facility];  Service: Endoscopy;  Laterality: N/A;   COLONOSCOPY     COLONOSCOPY N/A 09/20/2017   Procedure: COLONOSCOPY;  Surgeon: Ruby Corporal, MD;  Location: [facility];  Service: Endoscopy;  Laterality: N/A;  1200   CORONARY ARTERY BYPASS GRAFT N/A 06/14/2015   Procedure: CORONARY ARTERY BYPASS GRAFTING times four using Left Internal  mammary artery and right leg Saphenous vein graft;  Surgeon: Heriberto London, MD;  Location: [facility] OR;  Service: Open Heart Surgery;  Laterality: N/A;   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   HAND SURGERY     IR EXCHANGE BILIARY DRAIN  07/31/2023   IR EXCHANGE BILIARY DRAIN  08/23/2023   IR PERC CHOLECYSTOSTOMY  07/03/2023   SKIN CANCER EXCISION Left    "forearm"   TEE WITHOUT CARDIOVERSION N/A 06/14/2015   Procedure: TRANSESOPHAGEAL ECHOCARDIOGRAM (TEE);  Surgeon: Heriberto London, MD;  Location: [facility] OR;  Service: Open Heart Surgery;  Laterality: N/A;   Patient Active Problem List   Diagnosis Date Noted   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) 07/31/2023   Urinary incontinence 07/27/2023   Acute CVA (cerebrovascular accident) (HCC) 07/14/2023   Protein-calorie malnutrition, severe 07/07/2023   Cholecystitis 07/03/2023   Moderate cognitive impairment 07/02/2023   Acute abdominal pain 07/02/2023   Sepsis with acute organ dysfunction without septic shock (HCC) 07/02/2023   Hemiparesis affecting left side as late effect of cerebrovascular accident (CVA) (HCC) 07/02/2023   Cerebral ischemic stroke due to global hypoperfusion with watershed infarct [facility]) 06/23/2023   Stroke (cerebrum) (HCC) 06/22/2023   Left-sided weakness 06/21/2023   CKD stage 3a, GFR 45-59  ml/min (HCC) - Baseline scr 1.3-1.5 06/21/2023   Major depressive disorder 08/08/2021   Hx of colonic polyps 06/13/2017   Family hx of colon cancer 06/13/2017   Vitamin D  insufficiency 01/04/2017   Insulin  dependent type 2 diabetes mellitus (HCC) 09/01/2015   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   Mixed hyperlipidemia 06/11/2015   Essential hypertension    Coronary artery disease involving native coronary artery of native heart with unstable angina pectoris (HCC)    Angina, class II (HCC) 06/10/2015   Hypothyroidism 06/10/2015   History of tobacco use 06/10/2015   Chronic  low back pain 06/10/2015    PCP: Omie Bickers, MD  REFERRING PROVIDER: Harriet Limber, FNP  REFERRING DIAG: R29.6 (ICD-10-CM) - Repeated falls  THERAPY DIAG:  Other abnormalities of gait and mobility  Impaired functional mobility, balance, and endurance  Left-sided weakness  ONSET DATE: 8 months ago, following stroke  Rationale for Evaluation and Treatment: Rehabilitation  SUBJECTIVE:   SUBJECTIVE STATEMENT: Pt reports pain Lt LE and placed a patch on Lt calf, reports swelling in feet.  Pain scale 5/10 Lt LE, feels weak today and LBP 7/10.  Reports compliance with HEP daily.   Evaluation:  Pt reports pain rating of 7-8/10 upon presentation in low back. Pts wife joins in subjective section with pt showing decreased memory of his past medical history. Pt reported to have severe degenerative bone disease of back, had to retire because of back pain. Pt suffered a stroke 8 months ago, and left side weakness and decreased walking ability were noticed and spent some time in the inpatient setting for recovery. Pt then had at least one sx concerning gall bladder, September 21st. Pt is reported to get confused on occasion and has gotten slower getting up and down, somedays are better than others. Pt accompanied by: significant other  PERTINENT HISTORY:  -History of stroke -Quadruple bypass 2017? -Gall bladder procedure (s)  PAIN:  Are you having pain? Yes: NPRS scale: 7-8/10 Pain location: low back Pain description: aching Aggravating factors: standing, sitting in one position Relieving factors: lying down  PRECAUTIONS: Fall  RED FLAGS: None More frequent reported  WEIGHT BEARING RESTRICTIONS: No  FALLS: Has patient fallen in last 6 months? Yes. Number of falls 1-2  LIVING ENVIRONMENT: Lives with: lives with their spouse Lives in: House/apartment Stairs: No, ramp Has following equipment at home: Walker - 2 wheeled  PLOF: Independent with basic ADLs  PATIENT GOALS:  walk better, get back on tractor, lawn mower, normalcy back  OBJECTIVE:  Note: Objective measures were completed at Evaluation unless otherwise noted.  DIAGNOSTIC FINDINGS:  MRI of Brain 07/02/23: CLINICAL DATA:  Mental status change, unknown cause.   EXAM: MRI HEAD WITHOUT CONTRAST   TECHNIQUE: Multiplanar, multiecho pulse sequences of the brain and surrounding structures were obtained without intravenous contrast.   COMPARISON:  Head MRI and MRA 06/21/2023   FINDINGS: The study is intermittently moderately motion degraded.   Brain: Small acute to early subacute right cerebral hemispheric infarcts described on the prior MRI have evolved in the interim. Mildly restricted diffusion remains at the site of the posterior right basal ganglia infarct. No new infarct, mass, midline shift, or extra-axial fluid collection is identified. Patchy T2 hyperintensities in the cerebral white matter bilaterally are unchanged and nonspecific but compatible with moderate chronic small vessel ischemic disease. Chronic infarcts are again noted in the right basal ganglia, left corona radiata, pons, and both cerebellar hemispheres a chronic microhemorrhage in the right occipital lobe  is unchanged. There is moderate cerebral atrophy.   Vascular: Abnormal appearance of the distal left vertebral artery, more fully evaluated on the recent MRA.   Skull and upper cervical spine: No suspicious marrow lesion.   Sinuses/Orbits: Unremarkable orbits. Paranasal sinuses and mastoid air cells are clear.   Other: None.   IMPRESSION: 1. Interval evolution of the previously described recent infarcts. 2. No new infarct or other acute intracranial abnormality. 3. Moderate chronic small vessel ischemic disease with multiple chronic infarcts as above.  COGNITION: Overall cognitive status: Within functional limits for tasks assessed   SENSATION: Light touch: Impaired    POSTURE:  rounded shoulders,  forward head, decreased lumbar lordosis, and increased thoracic kyphosis   LOWER EXTREMITY MMT:   MMT Right eval Left eval  Hip flexion 4 3+  Hip abduction 4 3-  Hip adduction 4 3-  Hip internal rotation    Hip external rotation    Knee flexion 4 3-  Knee extension 4 3  Ankle dorsiflexion 4+ 3+  Ankle plantarflexion    Ankle inversion    Ankle eversion    (Blank rows = not tested)  GAIT: Gait pattern: step to pattern, decreased step length- Right, decreased step length- Left, decreased stance time- Left, decreased stride length, decreased hip/knee flexion- Left, decreased ankle dorsiflexion- Left, and shuffling Distance walked: 80 feet Assistive device utilized: Walker - 2 wheeled Level of assistance: Modified independence Comments: Pt demonstrates decreased progression of gait due to left LE weakness.  FUNCTIONAL TESTS:  5 times sit to stand: 2 in 30 seconds 2 minute walk test: 80 feet with RW RLE balance better during balance assessment PATIENT SURVEYS:  None completed                                                                                                                              TREATMENT DATE:  03/06/24: DGI 1. Gait level surface (0) Severe Impairment: Cannot walk 20' without assistance, severe gait deviations or imbalance. 2. Change in gait speed (0) Severe Impairment: Cannot change speeds, or loses balance and has to reach for wall or be caught. 3. Gait with horizontal head turns (1) Moderate Impairment: Performs head turns with moderate change in gait velocity, slows down, staggers but recovers, can continue to walk. 4. Gait with vertical head turns (0) Severe Impairment: Performs task with severe disruption of gait, i.e., staggers outside 15" path, loses balance, stops, reaches for wall. 5. Gait and pivot turn (1) Moderate Impairment: Turns slowly, requires verbal cueing, requires several small steps to catch balance following turn and stop. 6.  Step over obstacle (1) Moderate Impairment: Is able to step over box but must stop, then step over. May require verbal cueing. 7. Step around obstacles (1) Moderate Impairment: Is able to clear cones but must significantly slow, speed to accomplish task, or requires verbal cueing. 8. Stairs (1) Moderate Impairment: Two feet to a stair, must use rail.  TOTAL SCORE:  5 / 24  Educated benefits with compression garments for edema control Seated: Dorsiflexion with yellow theraband 10x5 STS with cueing for proper hand and foot placement 5x  Sidelying: clam with YTB Gait training with cueing for standing within the walker, heel to toe mechanics and equal stride length with frequent cueing to advance Lt LE.    03/04/24 Sit to stands with 2" foam in seat 5X no UE CGA Seated LAQ 10X5" each LE Supine bridge 2X10  SLR 10X each  LTR 10X  Knee to chest with towel 5X10" each side Goal review, POC moving forward Ambulation around clinic working on heel toe gait (tends to drag Lt foot)  02/21/2024  Evaluation: -ROM measured, Strength assessed, HEP prescribed, pt educated on prognosis, findings, and importance of HEP compliance if given.    PATIENT EDUCATION: Education details: Pt was educated on findings of PT evaluation, prognosis, frequency of therapy visits and rationale, attendance policy, and HEP if given.   Person educated: Patient and Spouse Education method: Explanation, Verbal cues, and Handouts Education comprehension: verbalized understanding and needs further education  HOME EXERCISE PROGRAM: Access Code: [email or web address] URL: [email or web address] Date: 02/21/2024 Prepared by: Armond Bertin  Exercises - Sit to Stand with Armchair  - 1 x daily - 7 x weekly - 2 sets - 10 reps - Standing Hip Extension with Counter Support  - 1 x daily - 7 x weekly - 3 sets - 10 reps - Standing Hip Abduction with Counter Support  - 1 x daily - 7 x weekly - 3 sets - 10 reps  GOALS: Goals  reviewed with patient? Yes  SHORT TERM GOALS: Target date: 03/13/24  Patient will demonstrate evidence of independence with individualized HEP and will report compliance for at least 3 days per week for optimized progression towards remaining therapy goals. Baseline:  Goal status: IN PROGRESS  2.  Patient will report a decrease in pain level during community ambulation by at least 2 points for improved quality of life. Baseline: 7/10 Goal status: IN PROGRESS     LONG TERM GOALS: Target date: 04/03/24  Pt will demonstrate a an increase of at least 10 seconds bilaterally during static tandem stance for decreased falls risk. Baseline: about 2 seconds per side Goal status: IN PROGRESS  2.  Pt will improve 2 MWT by 140 feet in order to demonstrate improved functional ambulatory capacity in community setting.  Baseline: 80 feet Goal status: IN PROGRESS  3.  Pt will demonstrate ability to manage RW in more cautious manner by keeping one foot in walker area during ambulation for decreased anterior lean and decreased UE demand for support during ambulation for improved independence with ambulation. Baseline: currently walker gets away from him and he is heavily dependent on UEs Goal status: IN PROGRESS  4.  Pt will demonstrate at least 4-/5 MMT for left lower extremity for increased strength during ADL and community ambulation. Baseline: see above Goal status: IN PROGRESS   ASSESSMENT:  CLINICAL IMPRESSION: 03/06/24:  Began session with DGI, presents with significant weakness and min A for safety with testing, needs to ambulate with RW for safety at all times.  Pt reports increased LBP and Lt LE pain with standing.  REports of swelling in foot and calf.  Pt educated on benefits with compression garments, measurements taken and given ETI handout. Gait training complete to encourage heel to toe mechanics and equal stride length, pt with tendency to drag Lt foot, cueing to standing within  walker.  Added  theraband resistance with dorsiflexion and clam for gluteal strengthening, cueing for correct form and mechanics.  EOS pt c/o LBP and overall weakness.  Evaluation:  Patient is a 78 y.o. male who was seen today for physical therapy evaluation and treatment for R29.6 (ICD-10-CM) - Repeated falls.  Patient demonstrates decreased LE strength, abnormal pain rating, and impaired balance. Patient also demonstrates difficulty with ambulation during today's session with significant decrease in stride length and velocity noted. Patient also demonstrates left sided weakness especially in LLE compared to the RLE. Patient requires VC for proper management of RW during ambulation and is lacking in ambulation tolerance overall. Patient would benefit from skilled physical therapy for increased endurance with ambulation, increased LE strength, and balance for improved gait quality, return to higher level of function with ADLs, and progress towards therapy goals.   OBJECTIVE IMPAIRMENTS: Abnormal gait, decreased balance, decreased endurance, decreased knowledge of use of DME, decreased mobility, difficulty walking, decreased strength, and pain.   ACTIVITY LIMITATIONS: carrying, lifting, bending, standing, squatting, stairs, and locomotion level  PARTICIPATION LIMITATIONS: cleaning, medication management, driving, shopping, community activity, occupation, and yard work  PERSONAL FACTORS: Age, Past/current experiences, Time since onset of injury/illness/exacerbation, and 1-2 comorbidities: heart surgery, gall bladder procedure, stroke  are also affecting patient's functional outcome.   REHAB POTENTIAL: Good  CLINICAL DECISION MAKING: Evolving/moderate complexity  EVALUATION COMPLEXITY: Moderate   PLAN:  PT FREQUENCY: 2x/week  PT DURATION: 6 weeks  PLANNED INTERVENTIONS: 97110-Therapeutic exercises, 97530- Therapeutic activity, 97112- Neuromuscular re-education, 97535- Self Care, [id number]- Manual  therapy, [phone]- Gait training, Patient/Family education, Balance training, and Stair training  PLAN FOR NEXT SESSION:  Continue to  progress dynamic and static balance, progress bilateral LE strengthening with focus on LLE  Minor Amble, LPTA/CLT; CBIS [phone]  4:30 PM, 03/06/24

## 2024-03-12 ENCOUNTER — Ambulatory Visit (HOSPITAL_COMMUNITY): Payer: PRIVATE HEALTH INSURANCE

## 2024-03-12 ENCOUNTER — Encounter (HOSPITAL_COMMUNITY): Payer: Self-pay

## 2024-03-12 DIAGNOSIS — Z7409 Other reduced mobility: Secondary | ICD-10-CM

## 2024-03-12 DIAGNOSIS — R2689 Other abnormalities of gait and mobility: Secondary | ICD-10-CM

## 2024-03-12 DIAGNOSIS — R531 Weakness: Secondary | ICD-10-CM

## 2024-03-12 NOTE — Therapy (Signed)
 OUTPATIENT PHYSICAL THERAPY BALANCE/VESTIBULAR TREATMENT     Patient Name: Hunter Sparks MRN: [id number] DOB:July 16, 1946, 78 y.o., male Today's Date: 03/12/2024  END OF SESSION:  PT End of Session - 03/12/24 0921     Visit Number 4    Number of Visits 12    Date for PT Re-Evaluation 04/03/24    Authorization Type [employer]    Authorization Time Period no auth    Progress Note Due on Visit 10    PT Start Time 0921    PT Stop Time 1012    PT Time Calculation (min) 51 min    Equipment Utilized During Treatment Gait belt    Activity Tolerance Patient tolerated treatment well    Behavior During Therapy WFL for tasks assessed/performed             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HCC)    Arthritis    "back, hips, legs" (06/10/2015)   Chest pain    Chronic lower back pain    Coronary artery disease    Depression    DJD (degenerative joint disease)    History of kidney stones    Hyperlipidemia 06/11/2015   Hypertension    Hypothyroidism    Stroke (HCC)    Type II diabetes mellitus (HCC)    Past Surgical History:  Procedure Laterality Date   CARDIAC CATHETERIZATION N/A 06/11/2015   Procedure: Left Heart Cath and Coronary Angiography;  Surgeon: Arty Binning, MD;  Location: [facility] INVASIVE CV LAB;  Service: Cardiovascular;  Laterality: N/A;   CHOLECYSTECTOMY N/A 09/18/2023   Procedure: LAPAROSCOPIC CHOLECYSTECTOMY;  Surgeon: Shela Derby, MD;  Location: [facility] OR;  Service: General;  Laterality: N/A;   COLONOSCOPY  06/21/2011   Procedure: COLONOSCOPY;  Surgeon: Ruby Corporal, MD;  Location: [facility] SUITE;  Service: Endoscopy;  Laterality: N/A;   COLONOSCOPY     COLONOSCOPY N/A 09/20/2017   Procedure: COLONOSCOPY;  Surgeon: Ruby Corporal, MD;  Location: [facility];  Service: Endoscopy;  Laterality: N/A;  1200   CORONARY ARTERY BYPASS GRAFT N/A 06/14/2015   Procedure: CORONARY ARTERY BYPASS GRAFTING times four using Left Internal  mammary artery and right leg Saphenous vein graft;  Surgeon: Heriberto London, MD;  Location: [facility] OR;  Service: Open Heart Surgery;  Laterality: N/A;   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   HAND SURGERY     IR EXCHANGE BILIARY DRAIN  07/31/2023   IR EXCHANGE BILIARY DRAIN  08/23/2023   IR PERC CHOLECYSTOSTOMY  07/03/2023   SKIN CANCER EXCISION Left    "forearm"   TEE WITHOUT CARDIOVERSION N/A 06/14/2015   Procedure: TRANSESOPHAGEAL ECHOCARDIOGRAM (TEE);  Surgeon: Heriberto London, MD;  Location: [facility] OR;  Service: Open Heart Surgery;  Laterality: N/A;   Patient Active Problem List   Diagnosis Date Noted   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) 07/31/2023   Urinary incontinence 07/27/2023   Acute CVA (cerebrovascular accident) (HCC) 07/14/2023   Protein-calorie malnutrition, severe 07/07/2023   Cholecystitis 07/03/2023   Moderate cognitive impairment 07/02/2023   Acute abdominal pain 07/02/2023   Sepsis with acute organ dysfunction without septic shock (HCC) 07/02/2023   Hemiparesis affecting left side as late effect of cerebrovascular accident (CVA) (HCC) 07/02/2023   Cerebral ischemic stroke due to global hypoperfusion with watershed infarct [place]) 06/23/2023   Stroke (cerebrum) (HCC) 06/22/2023   Left-sided weakness 06/21/2023   CKD stage 3a, GFR 45-59  ml/min (HCC) - Baseline scr 1.3-1.5 06/21/2023   Major depressive disorder 08/08/2021   Hx of colonic polyps 06/13/2017   Family hx of colon cancer 06/13/2017   Vitamin D  insufficiency 01/04/2017   Insulin  dependent type 2 diabetes mellitus (HCC) 09/01/2015   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   Mixed hyperlipidemia 06/11/2015   Essential hypertension    Coronary artery disease involving native coronary artery of native heart with unstable angina pectoris (HCC)    Angina, class II (HCC) 06/10/2015   Hypothyroidism 06/10/2015   History of tobacco use 06/10/2015   Chronic  low back pain 06/10/2015    PCP: Omie Bickers, MD  REFERRING PROVIDER: Harriet Limber, FNP  REFERRING DIAG: R29.6 (ICD-10-CM) - Repeated falls  THERAPY DIAG:  Other abnormalities of gait and mobility  Impaired functional mobility, balance, and endurance  Left-sided weakness  ONSET DATE: 8 months ago, following stroke  Rationale for Evaluation and Treatment: Rehabilitation  SUBJECTIVE:   SUBJECTIVE STATEMENT: Reports he is pain all over including neck, back and LE, current pain scale 8/10.  Feels weak today.  No reports of recent fall or dizziness. Has been compliant with HEP, stated he can't see a difference.     Evaluation:  Pt reports pain rating of 7-8/10 upon presentation in low back. Pts wife joins in subjective section with pt showing decreased memory of his past medical history. Pt reported to have severe degenerative bone disease of back, had to retire because of back pain. Pt suffered a stroke 8 months ago, and left side weakness and decreased walking ability were noticed and spent some time in the inpatient setting for recovery. Pt then had at least one sx concerning gall bladder, September 21st. Pt is reported to get confused on occasion and has gotten slower getting up and down, somedays are better than others. Pt accompanied by: significant other  PERTINENT HISTORY:  -History of stroke -Quadruple bypass 2017? -Gall bladder procedure (s)  PAIN:  Are you having pain? Yes: NPRS scale: 7-8/10 Pain location: low back Pain description: aching Aggravating factors: standing, sitting in one position Relieving factors: lying down  PRECAUTIONS: Fall  RED FLAGS: None More frequent reported  WEIGHT BEARING RESTRICTIONS: No  FALLS: Has patient fallen in last 6 months? Yes. Number of falls 1-2  LIVING ENVIRONMENT: Lives with: lives with their spouse Lives in: House/apartment Stairs: No, ramp Has following equipment at home: Walker - 2 wheeled  PLOF:  Independent with basic ADLs  PATIENT GOALS: walk better, get back on tractor, lawn mower, normalcy back  OBJECTIVE:  Note: Objective measures were completed at Evaluation unless otherwise noted.  DIAGNOSTIC FINDINGS:  MRI of Brain 07/02/23: CLINICAL DATA:  Mental status change, unknown cause.   EXAM: MRI HEAD WITHOUT CONTRAST   TECHNIQUE: Multiplanar, multiecho pulse sequences of the brain and surrounding structures were obtained without intravenous contrast.   COMPARISON:  Head MRI and MRA 06/21/2023   FINDINGS: The study is intermittently moderately motion degraded.   Brain: Small acute to early subacute right cerebral hemispheric infarcts described on the prior MRI have evolved in the interim. Mildly restricted diffusion remains at the site of the posterior right basal ganglia infarct. No new infarct, mass, midline shift, or extra-axial fluid collection is identified. Patchy T2 hyperintensities in the cerebral white matter bilaterally are unchanged and nonspecific but compatible with moderate chronic small vessel ischemic disease. Chronic infarcts are again noted in the right basal ganglia, left corona radiata, pons, and both cerebellar hemispheres a chronic  microhemorrhage in the right occipital lobe is unchanged. There is moderate cerebral atrophy.   Vascular: Abnormal appearance of the distal left vertebral artery, more fully evaluated on the recent MRA.   Skull and upper cervical spine: No suspicious marrow lesion.   Sinuses/Orbits: Unremarkable orbits. Paranasal sinuses and mastoid air cells are clear.   Other: None.   IMPRESSION: 1. Interval evolution of the previously described recent infarcts. 2. No new infarct or other acute intracranial abnormality. 3. Moderate chronic small vessel ischemic disease with multiple chronic infarcts as above.  COGNITION: Overall cognitive status: Within functional limits for tasks assessed   SENSATION: Light touch:  Impaired    POSTURE:  rounded shoulders, forward head, decreased lumbar lordosis, and increased thoracic kyphosis   LOWER EXTREMITY MMT:   MMT Right eval Left eval  Hip flexion 4 3+  Hip abduction 4 3-  Hip adduction 4 3-  Hip internal rotation    Hip external rotation    Knee flexion 4 3-  Knee extension 4 3  Ankle dorsiflexion 4+ 3+  Ankle plantarflexion    Ankle inversion    Ankle eversion    (Blank rows = not tested)  GAIT: Gait pattern: step to pattern, decreased step length- Right, decreased step length- Left, decreased stance time- Left, decreased stride length, decreased hip/knee flexion- Left, decreased ankle dorsiflexion- Left, and shuffling Distance walked: 80 feet Assistive device utilized: Walker - 2 wheeled Level of assistance: Modified independence Comments: Pt demonstrates decreased progression of gait due to left LE weakness.  FUNCTIONAL TESTS:  5 times sit to stand: 2 in 30 seconds 2 minute walk test: 80 feet with RW RLE balance better during balance assessment PATIENT SURVEYS:  None completed                                                                                                                              TREATMENT DATE:  03/12/24: Nustep [place] x 5' UE/LE average SPM 50 Prone: POE x 2 Supine:  Bridge 2x 10 cueing for hips level Marching 10x each Sidelying:  clam with YTB Seated:  Dorsiflexion with yellow theraband 10x5 Hamstring curls against YTB resistance 15x 5" Hip flexion to abduction/adduction over 6in hurdle 20x STS with appropriate arm positions 10x Standing:  Toe tapping alternating onto 6in step height 10x with 1 HHA and SBA Heel raises 10x 3"     03/06/24: DGI 1. Gait level surface (0) Severe Impairment: Cannot walk 20' without assistance, severe gait deviations or imbalance. 2. Change in gait speed (0) Severe Impairment: Cannot change speeds, or loses balance and has to reach for wall or be caught. 3. Gait  with horizontal head turns (1) Moderate Impairment: Performs head turns with moderate change in gait velocity, slows down, staggers but recovers, can continue to walk. 4. Gait with vertical head turns (0) Severe Impairment: Performs task with severe disruption of gait, i.e., staggers outside 15" path, loses balance, stops, reaches for wall.  5. Gait and pivot turn (1) Moderate Impairment: Turns slowly, requires verbal cueing, requires several small steps to catch balance following turn and stop. 6. Step over obstacle (1) Moderate Impairment: Is able to step over box but must stop, then step over. May require verbal cueing. 7. Step around obstacles (1) Moderate Impairment: Is able to clear cones but must significantly slow, speed to accomplish task, or requires verbal cueing. 8. Stairs (1) Moderate Impairment: Two feet to a stair, must use rail.  TOTAL SCORE: 5 / 24  Educated benefits with compression garments for edema control Seated: Dorsiflexion with yellow theraband 10x5 STS with cueing for proper hand and foot placement 5x  Sidelying: clam with YTB Gait training with cueing for standing within the walker, heel to toe mechanics and equal stride length with frequent cueing to advance Lt LE.    03/04/24 Sit to stands with 2" foam in seat 5X no UE CGA Seated LAQ 10X5" each LE Supine bridge 2X10  SLR 10X each  LTR 10X  Knee to chest with towel 5X10" each side Goal review, POC moving forward Ambulation around clinic working on heel toe gait (tends to drag Lt foot)  02/21/2024  Evaluation: -ROM measured, Strength assessed, HEP prescribed, pt educated on prognosis, findings, and importance of HEP compliance if given.    PATIENT EDUCATION: Education details: Pt was educated on findings of PT evaluation, prognosis, frequency of therapy visits and rationale, attendance policy, and HEP if given.   Person educated: Patient and Spouse Education method: Explanation, Verbal cues, and  Handouts Education comprehension: verbalized understanding and needs further education  HOME EXERCISE PROGRAM: Access Code: [email or web address] URL: [email or web address] Date: 02/21/2024 Prepared by: Armond Bertin  Exercises - Sit to Stand with Armchair  - 1 x daily - 7 x weekly - 2 sets - 10 reps - Standing Hip Extension with Counter Support  - 1 x daily - 7 x weekly - 3 sets - 10 reps - Standing Hip Abduction with Counter Support  - 1 x daily - 7 x weekly - 3 sets - 10 reps  03/12/24: - Supine Bridge  - 2 x daily - 7 x weekly - 1 sets - 10 reps - 5" hold - Clam with Resistance  - 2 x daily - 7 x weekly - 1 sets - 10 reps - 5" hold  GOALS: Goals reviewed with patient? Yes  SHORT TERM GOALS: Target date: 03/13/24  Patient will demonstrate evidence of independence with individualized HEP and will report compliance for at least 3 days per week for optimized progression towards remaining therapy goals. Baseline:  Goal status: IN PROGRESS  2.  Patient will report a decrease in pain level during community ambulation by at least 2 points for improved quality of life. Baseline: 7/10 Goal status: IN PROGRESS     LONG TERM GOALS: Target date: 04/03/24  Pt will demonstrate a an increase of at least 10 seconds bilaterally during static tandem stance for decreased falls risk. Baseline: about 2 seconds per side Goal status: IN PROGRESS  2.  Pt will improve 2 MWT by 140 feet in order to demonstrate improved functional ambulatory capacity in community setting.  Baseline: 80 feet Goal status: IN PROGRESS  3.  Pt will demonstrate ability to manage RW in more cautious manner by keeping one foot in walker area during ambulation for decreased anterior lean and decreased UE demand for support during ambulation for improved independence with ambulation. Baseline: currently walker gets away from him and he  is heavily dependent on UEs Goal status: IN PROGRESS  4.  Pt will demonstrate at least  4-/5 MMT for left lower extremity for increased strength during ADL and community ambulation. Baseline: see above Goal status: IN PROGRESS   ASSESSMENT:  CLINICAL IMPRESSION: 03/12/24:  Educated importance of regular compliance with HEP to address weakness and length of time to see improvements.  Pt limited by pain this session, monitored through session.  Began session with Nustep for dynamic warm up and to improve UE/LE sequence with gait.  Following warm up began mat activities proximal strengthening exercises for hip strengthening to assist with pain control as reports increased pain with standing.  Pt with cueing for proper form with majority of exercises to assure correct musculature activation.  Gait training to improve Lt swing, stride length and heel to toe mechanics; did stand at proper distance from walker without cueing.  Added some mat activities to HEP for hip strengthening with handout given and verbalized understanding.     Evaluation:  Patient is a 78 y.o. male who was seen today for physical therapy evaluation and treatment for R29.6 (ICD-10-CM) - Repeated falls.  Patient demonstrates decreased LE strength, abnormal pain rating, and impaired balance. Patient also demonstrates difficulty with ambulation during today's session with significant decrease in stride length and velocity noted. Patient also demonstrates left sided weakness especially in LLE compared to the RLE. Patient requires VC for proper management of RW during ambulation and is lacking in ambulation tolerance overall. Patient would benefit from skilled physical therapy for increased endurance with ambulation, increased LE strength, and balance for improved gait quality, return to higher level of function with ADLs, and progress towards therapy goals.   OBJECTIVE IMPAIRMENTS: Abnormal gait, decreased balance, decreased endurance, decreased knowledge of use of DME, decreased mobility, difficulty walking, decreased  strength, and pain.   ACTIVITY LIMITATIONS: carrying, lifting, bending, standing, squatting, stairs, and locomotion level  PARTICIPATION LIMITATIONS: cleaning, medication management, driving, shopping, community activity, occupation, and yard work  PERSONAL FACTORS: Age, Past/current experiences, Time since onset of injury/illness/exacerbation, and 1-2 comorbidities: heart surgery, gall bladder procedure, stroke  are also affecting patient's functional outcome.   REHAB POTENTIAL: Good  CLINICAL DECISION MAKING: Evolving/moderate complexity  EVALUATION COMPLEXITY: Moderate   PLAN:  PT FREQUENCY: 2x/week  PT DURATION: 6 weeks  PLANNED INTERVENTIONS: 97110-Therapeutic exercises, 97530- Therapeutic activity, 97112- Neuromuscular re-education, 97535- Self Care, 13086- Manual therapy, [phone]- Gait training, Patient/Family education, Balance training, and Stair training  PLAN FOR NEXT SESSION:  Continue to  progress dynamic and static balance, progress bilateral LE strengthening with focus on LLE  Minor Amble, LPTA/CLT; CBIS [phone]  10:21 AM, 03/12/24

## 2024-03-14 ENCOUNTER — Ambulatory Visit (HOSPITAL_COMMUNITY): Payer: PRIVATE HEALTH INSURANCE | Attending: Nurse Practitioner

## 2024-03-14 DIAGNOSIS — Z7409 Other reduced mobility: Secondary | ICD-10-CM | POA: Insufficient documentation

## 2024-03-14 DIAGNOSIS — R531 Weakness: Secondary | ICD-10-CM | POA: Diagnosis not present

## 2024-03-14 DIAGNOSIS — R2689 Other abnormalities of gait and mobility: Secondary | ICD-10-CM | POA: Insufficient documentation

## 2024-03-14 NOTE — Therapy (Signed)
 OUTPATIENT PHYSICAL THERAPY BALANCE/VESTIBULAR TREATMENT     Patient Name: Hunter Sparks MRN: [id number] DOB:02/10/1946, 78 y.o., male Today's Date: 03/14/2024  END OF SESSION:  PT End of Session - 03/14/24 1433     Visit Number 5    Number of Visits 12    Date for PT Re-Evaluation 04/03/24    Authorization Type [employer]    Authorization Time Period no auth    Progress Note Due on Visit 10    PT Start Time 1431    PT Stop Time 1510    PT Time Calculation (min) 39 min    Equipment Utilized During Treatment Gait belt    Activity Tolerance Patient tolerated treatment well;Patient limited by pain    Behavior During Therapy WFL for tasks assessed/performed              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HCC)    Arthritis    "back, hips, legs" (06/10/2015)   Chest pain    Chronic lower back pain    Coronary artery disease    Depression    DJD (degenerative joint disease)    History of kidney stones    Hyperlipidemia 06/11/2015   Hypertension    Hypothyroidism    Stroke (HCC)    Type II diabetes mellitus (HCC)    Past Surgical History:  Procedure Laterality Date   CARDIAC CATHETERIZATION N/A 06/11/2015   Procedure: Left Heart Cath and Coronary Angiography;  Surgeon: Arty Binning, MD;  Location: [facility] INVASIVE CV LAB;  Service: Cardiovascular;  Laterality: N/A;   CHOLECYSTECTOMY N/A 09/18/2023   Procedure: LAPAROSCOPIC CHOLECYSTECTOMY;  Surgeon: Shela Derby, MD;  Location: [facility] OR;  Service: General;  Laterality: N/A;   COLONOSCOPY  06/21/2011   Procedure: COLONOSCOPY;  Surgeon: Ruby Corporal, MD;  Location: [facility] ENDO SUITE;  Service: Endoscopy;  Laterality: N/A;   COLONOSCOPY     COLONOSCOPY N/A 09/20/2017   Procedure: COLONOSCOPY;  Surgeon: Ruby Corporal, MD;  Location: [facility] ENDO SUITE;  Service: Endoscopy;  Laterality: N/A;  1200   CORONARY ARTERY BYPASS GRAFT N/A 06/14/2015   Procedure: CORONARY ARTERY BYPASS GRAFTING times  four using Left Internal mammary artery and right leg Saphenous vein graft;  Surgeon: Heriberto London, MD;  Location: [facility] OR;  Service: Open Heart Surgery;  Laterality: N/A;   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   HAND SURGERY     IR EXCHANGE BILIARY DRAIN  07/31/2023   IR EXCHANGE BILIARY DRAIN  08/23/2023   IR PERC CHOLECYSTOSTOMY  07/03/2023   SKIN CANCER EXCISION Left    "forearm"   TEE WITHOUT CARDIOVERSION N/A 06/14/2015   Procedure: TRANSESOPHAGEAL ECHOCARDIOGRAM (TEE);  Surgeon: Heriberto London, MD;  Location: [facility] OR;  Service: Open Heart Surgery;  Laterality: N/A;   Patient Active Problem List   Diagnosis Date Noted   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) 07/31/2023   Urinary incontinence 07/27/2023   Acute CVA (cerebrovascular accident) (HCC) 07/14/2023   Protein-calorie malnutrition, severe 07/07/2023   Cholecystitis 07/03/2023   Moderate cognitive impairment 07/02/2023   Acute abdominal pain 07/02/2023   Sepsis with acute organ dysfunction without septic shock (HCC) 07/02/2023   Hemiparesis affecting left side as late effect of cerebrovascular accident (CVA) (HCC) 07/02/2023   Cerebral ischemic stroke due to global hypoperfusion with watershed infarct [facility]) 06/23/2023   Stroke (cerebrum) (HCC) 06/22/2023   Left-sided weakness 06/21/2023   CKD  stage 3a, GFR 45-59 ml/min (HCC) - Baseline scr 1.3-1.5 06/21/2023   Major depressive disorder 08/08/2021   Hx of colonic polyps 06/13/2017   Family hx of colon cancer 06/13/2017   Vitamin D  insufficiency 01/04/2017   Insulin  dependent type 2 diabetes mellitus (HCC) 09/01/2015   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   Mixed hyperlipidemia 06/11/2015   Essential hypertension    Coronary artery disease involving native coronary artery of native heart with unstable angina pectoris (HCC)    Angina, class II (HCC) 06/10/2015   Hypothyroidism 06/10/2015   History of tobacco use  06/10/2015   Chronic low back pain 06/10/2015    PCP: Omie Bickers, MD  REFERRING PROVIDER: Harriet Limber, FNP  REFERRING DIAG: R29.6 (ICD-10-CM) - Repeated falls  THERAPY DIAG:  Other abnormalities of gait and mobility  Impaired functional mobility, balance, and endurance  Left-sided weakness  ONSET DATE: 8 months ago, following stroke  Rationale for Evaluation and Treatment: Rehabilitation  SUBJECTIVE:   SUBJECTIVE STATEMENT: Pt reports no back pain upon presentation, just right hip pain. Pt has not been doing much today, just watching tv.    Evaluation:  Pt reports pain rating of 7-8/10 upon presentation in low back. Pts wife joins in subjective section with pt showing decreased memory of his past medical history. Pt reported to have severe degenerative bone disease of back, had to retire because of back pain. Pt suffered a stroke 8 months ago, and left side weakness and decreased walking ability were noticed and spent some time in the inpatient setting for recovery. Pt then had at least one sx concerning gall bladder, September 21st. Pt is reported to get confused on occasion and has gotten slower getting up and down, somedays are better than others. Pt accompanied by: significant other  PERTINENT HISTORY:  -History of stroke -Quadruple bypass 2017? -Gall bladder procedure (s)  PAIN:  Are you having pain? Yes: NPRS scale: 7-8/10 Pain location: low back Pain description: aching Aggravating factors: standing, sitting in one position Relieving factors: lying down  PRECAUTIONS: Fall  RED FLAGS: None More frequent reported  WEIGHT BEARING RESTRICTIONS: No  FALLS: Has patient fallen in last 6 months? Yes. Number of falls 1-2  LIVING ENVIRONMENT: Lives with: lives with their spouse Lives in: House/apartment Stairs: No, ramp Has following equipment at home: Walker - 2 wheeled  PLOF: Independent with basic ADLs  PATIENT GOALS: walk better, get back on  tractor, lawn mower, normalcy back  OBJECTIVE:  Note: Objective measures were completed at Evaluation unless otherwise noted.  DIAGNOSTIC FINDINGS:  MRI of Brain 07/02/23: CLINICAL DATA:  Mental status change, unknown cause.   EXAM: MRI HEAD WITHOUT CONTRAST   TECHNIQUE: Multiplanar, multiecho pulse sequences of the brain and surrounding structures were obtained without intravenous contrast.   COMPARISON:  Head MRI and MRA 06/21/2023   FINDINGS: The study is intermittently moderately motion degraded.   Brain: Small acute to early subacute right cerebral hemispheric infarcts described on the prior MRI have evolved in the interim. Mildly restricted diffusion remains at the site of the posterior right basal ganglia infarct. No new infarct, mass, midline shift, or extra-axial fluid collection is identified. Patchy T2 hyperintensities in the cerebral white matter bilaterally are unchanged and nonspecific but compatible with moderate chronic small vessel ischemic disease. Chronic infarcts are again noted in the right basal ganglia, left corona radiata, pons, and both cerebellar hemispheres a chronic microhemorrhage in the right occipital lobe is unchanged. There is moderate cerebral atrophy.  Vascular: Abnormal appearance of the distal left vertebral artery, more fully evaluated on the recent MRA.   Skull and upper cervical spine: No suspicious marrow lesion.   Sinuses/Orbits: Unremarkable orbits. Paranasal sinuses and mastoid air cells are clear.   Other: None.   IMPRESSION: 1. Interval evolution of the previously described recent infarcts. 2. No new infarct or other acute intracranial abnormality. 3. Moderate chronic small vessel ischemic disease with multiple chronic infarcts as above.  COGNITION: Overall cognitive status: Within functional limits for tasks assessed   SENSATION: Light touch: Impaired    POSTURE:  rounded shoulders, forward head, decreased lumbar  lordosis, and increased thoracic kyphosis   LOWER EXTREMITY MMT:   MMT Right eval Left eval  Hip flexion 4 3+  Hip abduction 4 3-  Hip adduction 4 3-  Hip internal rotation    Hip external rotation    Knee flexion 4 3-  Knee extension 4 3  Ankle dorsiflexion 4+ 3+  Ankle plantarflexion    Ankle inversion    Ankle eversion    (Blank rows = not tested)  GAIT: Gait pattern: step to pattern, decreased step length- Right, decreased step length- Left, decreased stance time- Left, decreased stride length, decreased hip/knee flexion- Left, decreased ankle dorsiflexion- Left, and shuffling Distance walked: 80 feet Assistive device utilized: Walker - 2 wheeled Level of assistance: Modified independence Comments: Pt demonstrates decreased progression of gait due to left LE weakness.  FUNCTIONAL TESTS:  5 times sit to stand: 2 in 30 seconds 2 minute walk test: 80 feet with RW RLE balance better during balance assessment PATIENT SURVEYS:  None completed                                                                                                                              TREATMENT DATE:  03/14/2024  Therapeutic Exercise: -Supine bridges 2 sets of 10 reps, 3 second holds, pt cued for max hip extension -LAQ, 3# AW, pt cued for eccentric control, 3 sets of 5,  -Standing marches with 3# AW, 2 sets, 30 minute bouts, pt cued for increased pace -Lumbar blue exercise rollouts in chair, 2 sets of 4 reps -Aeromat walks, 1 lap of lateral stepping and tandem walking, in // bars, pt cued for decreased UE support, BUE used majority of time. Neuromuscular Re-education: -DKTC 2 set of 10 reps, pt cued to remain in pain free ROM, second set with RTB at knees. -LTR 2 set of 10 reps bilaterally, pt cued to remain in pain free ROM Therapeutic Activity: -Sit to stands, staggered, 2 sets of 10 reps, pt cued for core activation -Step up and overs, with 3# AW, 1 set of 5 reps -Lateral step up and  overs, with 3# AW, 1 set of 5 reps    03/12/24: [place] x 5' UE/LE average SPM 50 Prone: POE x 2 Supine:  Bridge 2x 10 cueing for hips level Marching 10x each Sidelying:  clam  with YTB Seated:  Dorsiflexion with yellow theraband 10x5 Hamstring curls against YTB resistance 15x 5" Hip flexion to abduction/adduction over 6in hurdle 20x STS with appropriate arm positions 10x Standing:  Toe tapping alternating onto 6in step height 10x with 1 HHA and SBA Heel raises 10x 3"     03/06/24: DGI 1. Gait level surface (0) Severe Impairment: Cannot walk 20' without assistance, severe gait deviations or imbalance. 2. Change in gait speed (0) Severe Impairment: Cannot change speeds, or loses balance and has to reach for wall or be caught. 3. Gait with horizontal head turns (1) Moderate Impairment: Performs head turns with moderate change in gait velocity, slows down, staggers but recovers, can continue to walk. 4. Gait with vertical head turns (0) Severe Impairment: Performs task with severe disruption of gait, i.e., staggers outside 15" path, loses balance, stops, reaches for wall. 5. Gait and pivot turn (1) Moderate Impairment: Turns slowly, requires verbal cueing, requires several small steps to catch balance following turn and stop. 6. Step over obstacle (1) Moderate Impairment: Is able to step over box but must stop, then step over. May require verbal cueing. 7. Step around obstacles (1) Moderate Impairment: Is able to clear cones but must significantly slow, speed to accomplish task, or requires verbal cueing. 8. Stairs (1) Moderate Impairment: Two feet to a stair, must use rail.  TOTAL SCORE: 5 / 24  Educated benefits with compression garments for edema control Seated: Dorsiflexion with yellow theraband 10x5 STS with cueing for proper hand and foot placement 5x  Sidelying: clam with YTB Gait training with cueing for standing within the walker, heel to toe mechanics  and equal stride length with frequent cueing to advance Lt LE.    03/04/24 Sit to stands with 2" foam in seat 5X no UE CGA Seated LAQ 10X5" each LE Supine bridge 2X10  SLR 10X each  LTR 10X  Knee to chest with towel 5X10" each side Goal review, POC moving forward Ambulation around clinic working on heel toe gait (tends to drag Lt foot)  02/21/2024  Evaluation: -ROM measured, Strength assessed, HEP prescribed, pt educated on prognosis, findings, and importance of HEP compliance if given.    PATIENT EDUCATION: Education details: Pt was educated on findings of PT evaluation, prognosis, frequency of therapy visits and rationale, attendance policy, and HEP if given.   Person educated: Patient and Spouse Education method: Explanation, Verbal cues, and Handouts Education comprehension: verbalized understanding and needs further education  HOME EXERCISE PROGRAM: Access Code: [email or web address] URL: [email or web address] Date: 02/21/2024 Prepared by: Armond Bertin  Exercises - Sit to Stand with Armchair  - 1 x daily - 7 x weekly - 2 sets - 10 reps - Standing Hip Extension with Counter Support  - 1 x daily - 7 x weekly - 3 sets - 10 reps - Standing Hip Abduction with Counter Support  - 1 x daily - 7 x weekly - 3 sets - 10 reps  03/12/24: - Supine Bridge  - 2 x daily - 7 x weekly - 1 sets - 10 reps - 5" hold - Clam with Resistance  - 2 x daily - 7 x weekly - 1 sets - 10 reps - 5" hold  GOALS: Goals reviewed with patient? Yes  SHORT TERM GOALS: Target date: 03/13/24  Patient will demonstrate evidence of independence with individualized HEP and will report compliance for at least 3 days per week for optimized progression towards remaining therapy goals. Baseline:  Goal status: IN PROGRESS  2.  Patient will report a decrease in pain level during community ambulation by at least 2 points for improved quality of life. Baseline: 7/10 Goal status: IN PROGRESS     LONG TERM GOALS:  Target date: 04/03/24  Pt will demonstrate a an increase of at least 10 seconds bilaterally during static tandem stance for decreased falls risk. Baseline: about 2 seconds per side Goal status: IN PROGRESS  2.  Pt will improve 2 MWT by 140 feet in order to demonstrate improved functional ambulatory capacity in community setting.  Baseline: 80 feet Goal status: IN PROGRESS  3.  Pt will demonstrate ability to manage RW in more cautious manner by keeping one foot in walker area during ambulation for decreased anterior lean and decreased UE demand for support during ambulation for improved independence with ambulation. Baseline: currently walker gets away from him and he is heavily dependent on UEs Goal status: IN PROGRESS  4.  Pt will demonstrate at least 4-/5 MMT for left lower extremity for increased strength during ADL and community ambulation. Baseline: see above Goal status: IN PROGRESS   ASSESSMENT:  CLINICAL IMPRESSION: Patient continues to demonstrate low back pain, decreased LE strength, decreased gait quality and balance. Patient also demonstrates decreased endurance with aerobic based exercise during today's session, multiple rest breaks required throughout session. Pt reports increased low back pain after session today, consider manual techniques for pain management next session. Patient able to progress dynamic balance and core activation exercises today with lateral stepping and tandem aeromat walks, good performance with verbal cueing and demonstration. Patient would continue to benefit from skilled physical therapy for decreased LBP, increased endurance with ambulation, increased LE strength, and improved balance for improved quality of life, improved independence with gait training and continued progress towards therapy goals.    Evaluation:  Patient is a 78 y.o. male who was seen today for physical therapy evaluation and treatment for R29.6 (ICD-10-CM) - Repeated falls.   Patient demonstrates decreased LE strength, abnormal pain rating, and impaired balance. Patient also demonstrates difficulty with ambulation during today's session with significant decrease in stride length and velocity noted. Patient also demonstrates left sided weakness especially in LLE compared to the RLE. Patient requires VC for proper management of RW during ambulation and is lacking in ambulation tolerance overall. Patient would benefit from skilled physical therapy for increased endurance with ambulation, increased LE strength, and balance for improved gait quality, return to higher level of function with ADLs, and progress towards therapy goals.   OBJECTIVE IMPAIRMENTS: Abnormal gait, decreased balance, decreased endurance, decreased knowledge of use of DME, decreased mobility, difficulty walking, decreased strength, and pain.   ACTIVITY LIMITATIONS: carrying, lifting, bending, standing, squatting, stairs, and locomotion level  PARTICIPATION LIMITATIONS: cleaning, medication management, driving, shopping, community activity, occupation, and yard work  PERSONAL FACTORS: Age, Past/current experiences, Time since onset of injury/illness/exacerbation, and 1-2 comorbidities: heart surgery, gall bladder procedure, stroke  are also affecting patient's functional outcome.   REHAB POTENTIAL: Good  CLINICAL DECISION MAKING: Evolving/moderate complexity  EVALUATION COMPLEXITY: Moderate   PLAN:  PT FREQUENCY: 2x/week  PT DURATION: 6 weeks  PLANNED INTERVENTIONS: 97110-Therapeutic exercises, 97530- Therapeutic activity, 97112- Neuromuscular re-education, 97535- Self Care, [id number]- Manual therapy, [phone]- Gait training, Patient/Family education, Balance training, and Stair training  PLAN FOR NEXT SESSION:  Continue to  progress dynamic and static balance, progress bilateral LE strengthening with focus on LLE  Armond Bertin, PT, DPT [facility] Office: [phone] 3:21  PM, 03/14/24

## 2024-03-18 ENCOUNTER — Encounter (HOSPITAL_COMMUNITY): Payer: Self-pay

## 2024-03-18 ENCOUNTER — Ambulatory Visit (HOSPITAL_COMMUNITY): Payer: PRIVATE HEALTH INSURANCE

## 2024-03-18 DIAGNOSIS — R531 Weakness: Secondary | ICD-10-CM

## 2024-03-18 DIAGNOSIS — Z7409 Other reduced mobility: Secondary | ICD-10-CM

## 2024-03-18 DIAGNOSIS — R2689 Other abnormalities of gait and mobility: Secondary | ICD-10-CM

## 2024-03-18 NOTE — Therapy (Signed)
 OUTPATIENT PHYSICAL THERAPY BALANCE/VESTIBULAR TREATMENT     Patient Name: Hunter Sparks MRN: [id number] DOB:1946/06/23, 78 y.o., male Today's Date: 03/18/2024  END OF SESSION:  PT End of Session - 03/18/24 1518     Visit Number 6    Number of Visits 12    Date for PT Re-Evaluation 04/03/24    Authorization Type [employer]    Authorization Time Period no auth    Progress Note Due on Visit 10    PT Start Time 1435    PT Stop Time 1515    PT Time Calculation (min) 40 min    Equipment Utilized During Treatment Gait belt    Activity Tolerance Patient tolerated treatment well;Patient limited by pain    Behavior During Therapy WFL for tasks assessed/performed               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HCC)    Arthritis    "back, hips, legs" (06/10/2015)   Chest pain    Chronic lower back pain    Coronary artery disease    Depression    DJD (degenerative joint disease)    History of kidney stones    Hyperlipidemia 06/11/2015   Hypertension    Hypothyroidism    Stroke (HCC)    Type II diabetes mellitus (HCC)    Past Surgical History:  Procedure Laterality Date   CARDIAC CATHETERIZATION N/A 06/11/2015   Procedure: Left Heart Cath and Coronary Angiography;  Surgeon: Arty Binning, MD;  Location: [facility] INVASIVE CV LAB;  Service: Cardiovascular;  Laterality: N/A;   CHOLECYSTECTOMY N/A 09/18/2023   Procedure: LAPAROSCOPIC CHOLECYSTECTOMY;  Surgeon: Shela Derby, MD;  Location: [facility] OR;  Service: General;  Laterality: N/A;   COLONOSCOPY  06/21/2011   Procedure: COLONOSCOPY;  Surgeon: Ruby Corporal, MD;  Location: [facility];  Service: Endoscopy;  Laterality: N/A;   COLONOSCOPY     COLONOSCOPY N/A 09/20/2017   Procedure: COLONOSCOPY;  Surgeon: Ruby Corporal, MD;  Location: [facility];  Service: Endoscopy;  Laterality: N/A;  1200   CORONARY ARTERY BYPASS GRAFT N/A 06/14/2015   Procedure: CORONARY ARTERY BYPASS GRAFTING times  four using Left Internal mammary artery and right leg Saphenous vein graft;  Surgeon: Heriberto London, MD;  Location: [facility] OR;  Service: Open Heart Surgery;  Laterality: N/A;   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   HAND SURGERY     IR EXCHANGE BILIARY DRAIN  07/31/2023   IR EXCHANGE BILIARY DRAIN  08/23/2023   IR PERC CHOLECYSTOSTOMY  07/03/2023   SKIN CANCER EXCISION Left    "forearm"   TEE WITHOUT CARDIOVERSION N/A 06/14/2015   Procedure: TRANSESOPHAGEAL ECHOCARDIOGRAM (TEE);  Surgeon: Heriberto London, MD;  Location: [facility] OR;  Service: Open Heart Surgery;  Laterality: N/A;   Patient Active Problem List   Diagnosis Date Noted   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) 07/31/2023   Urinary incontinence 07/27/2023   Acute CVA (cerebrovascular accident) (HCC) 07/14/2023   Protein-calorie malnutrition, severe 07/07/2023   Cholecystitis 07/03/2023   Moderate cognitive impairment 07/02/2023   Acute abdominal pain 07/02/2023   Sepsis with acute organ dysfunction without septic shock (HCC) 07/02/2023   Hemiparesis affecting left side as late effect of cerebrovascular accident (CVA) (HCC) 07/02/2023   Cerebral ischemic stroke due to global hypoperfusion with watershed infarct [facility]) 06/23/2023   Stroke (cerebrum) (HCC) 06/22/2023   Left-sided weakness 06/21/2023  CKD stage 3a, GFR 45-59 ml/min (HCC) - Baseline scr 1.3-1.5 06/21/2023   Major depressive disorder 08/08/2021   Hx of colonic polyps 06/13/2017   Family hx of colon cancer 06/13/2017   Vitamin D  insufficiency 01/04/2017   Insulin  dependent type 2 diabetes mellitus (HCC) 09/01/2015   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   Mixed hyperlipidemia 06/11/2015   Essential hypertension    Coronary artery disease involving native coronary artery of native heart with unstable angina pectoris (HCC)    Angina, class II (HCC) 06/10/2015   Hypothyroidism 06/10/2015   History of tobacco use  06/10/2015   Chronic low back pain 06/10/2015    PCP: Omie Bickers, MD  REFERRING PROVIDER: Harriet Limber, FNP  REFERRING DIAG: R29.6 (ICD-10-CM) - Repeated falls  THERAPY DIAG:  Other abnormalities of gait and mobility  Impaired functional mobility, balance, and endurance  Left-sided weakness  ONSET DATE: 8 months ago, following stroke  Rationale for Evaluation and Treatment: Rehabilitation  SUBJECTIVE:   SUBJECTIVE STATEMENT: Reports increased pain Lt hip following last session.  Wife reports in bed the day following.  Current pain scale 7/10, Lt hip.   Evaluation:  Pt reports pain rating of 7-8/10 upon presentation in low back. Pts wife joins in subjective section with pt showing decreased memory of his past medical history. Pt reported to have severe degenerative bone disease of back, had to retire because of back pain. Pt suffered a stroke 8 months ago, and left side weakness and decreased walking ability were noticed and spent some time in the inpatient setting for recovery. Pt then had at least one sx concerning gall bladder, September 21st. Pt is reported to get confused on occasion and has gotten slower getting up and down, somedays are better than others. Pt accompanied by: significant other  PERTINENT HISTORY:  -History of stroke -Quadruple bypass 2017? -Gall bladder procedure (s)  PAIN:  Are you having pain? Yes: NPRS scale: 7-8/10 Pain location: low back Pain description: aching Aggravating factors: standing, sitting in one position Relieving factors: lying down  PRECAUTIONS: Fall  RED FLAGS: None More frequent reported  WEIGHT BEARING RESTRICTIONS: No  FALLS: Has patient fallen in last 6 months? Yes. Number of falls 1-2  LIVING ENVIRONMENT: Lives with: lives with their spouse Lives in: House/apartment Stairs: No, ramp Has following equipment at home: Walker - 2 wheeled  PLOF: Independent with basic ADLs  PATIENT GOALS: walk better, get back  on tractor, lawn mower, normalcy back  OBJECTIVE:  Note: Objective measures were completed at Evaluation unless otherwise noted.  DIAGNOSTIC FINDINGS:  MRI of Brain 07/02/23: CLINICAL DATA:  Mental status change, unknown cause.   EXAM: MRI HEAD WITHOUT CONTRAST   TECHNIQUE: Multiplanar, multiecho pulse sequences of the brain and surrounding structures were obtained without intravenous contrast.   COMPARISON:  Head MRI and MRA 06/21/2023   FINDINGS: The study is intermittently moderately motion degraded.   Brain: Small acute to early subacute right cerebral hemispheric infarcts described on the prior MRI have evolved in the interim. Mildly restricted diffusion remains at the site of the posterior right basal ganglia infarct. No new infarct, mass, midline shift, or extra-axial fluid collection is identified. Patchy T2 hyperintensities in the cerebral white matter bilaterally are unchanged and nonspecific but compatible with moderate chronic small vessel ischemic disease. Chronic infarcts are again noted in the right basal ganglia, left corona radiata, pons, and both cerebellar hemispheres a chronic microhemorrhage in the right occipital lobe is unchanged. There is moderate cerebral  atrophy.   Vascular: Abnormal appearance of the distal left vertebral artery, more fully evaluated on the recent MRA.   Skull and upper cervical spine: No suspicious marrow lesion.   Sinuses/Orbits: Unremarkable orbits. Paranasal sinuses and mastoid air cells are clear.   Other: None.   IMPRESSION: 1. Interval evolution of the previously described recent infarcts. 2. No new infarct or other acute intracranial abnormality. 3. Moderate chronic small vessel ischemic disease with multiple chronic infarcts as above.  COGNITION: Overall cognitive status: Within functional limits for tasks assessed   SENSATION: Light touch: Impaired    POSTURE:  rounded shoulders, forward head, decreased  lumbar lordosis, and increased thoracic kyphosis   LOWER EXTREMITY MMT:   MMT Right eval Left eval  Hip flexion 4 3+  Hip abduction 4 3-  Hip adduction 4 3-  Hip internal rotation    Hip external rotation    Knee flexion 4 3-  Knee extension 4 3  Ankle dorsiflexion 4+ 3+  Ankle plantarflexion    Ankle inversion    Ankle eversion    (Blank rows = not tested)  GAIT: Gait pattern: step to pattern, decreased step length- Right, decreased step length- Left, decreased stance time- Left, decreased stride length, decreased hip/knee flexion- Left, decreased ankle dorsiflexion- Left, and shuffling Distance walked: 80 feet Assistive device utilized: Walker - 2 wheeled Level of assistance: Modified independence Comments: Pt demonstrates decreased progression of gait due to left LE weakness.  FUNCTIONAL TESTS:  5 times sit to stand: 2 in 30 seconds 2 minute walk test: 80 feet with RW RLE balance better during balance assessment PATIENT SURVEYS:  None completed                                                                                                                              TREATMENT DATE:  03/18/24: Nustep [place] dynamic warm up with UE/LE  Gait training 75x 2 with cueing to increase stride length, heel to toe mechanics especially Lt foot Sidelying:  Clam RTB 2x 10 5" Supine bridge 10x 5" with RTB around thighs STS with appropriate arm position (cueing not to pull on RW but to push from chair) 2x 5  03/14/2024  Therapeutic Exercise: -Supine bridges 2 sets of 10 reps, 3 second holds, pt cued for max hip extension -LAQ, 3# AW, pt cued for eccentric control, 3 sets of 5,  -Standing marches with 3# AW, 2 sets, 30 minute bouts, pt cued for increased pace -Lumbar blue exercise rollouts in chair, 2 sets of 4 reps -Aeromat walks, 1 lap of lateral stepping and tandem walking, in // bars, pt cued for decreased UE support, BUE used majority of time. Neuromuscular  Re-education: -DKTC 2 set of 10 reps, pt cued to remain in pain free ROM, second set with RTB at knees. -LTR 2 set of 10 reps bilaterally, pt cued to remain in pain free ROM Therapeutic Activity: -Sit to stands, staggered, 2 sets  of 10 reps, pt cued for core activation -Step up and overs, with 3# AW, 1 set of 5 reps -Lateral step up and overs, with 3# AW, 1 set of 5 reps    03/12/24: Nustep [place] x 5' UE/LE average SPM 50 Prone: POE x 2 Supine:  Bridge 2x 10 cueing for hips level Marching 10x each Sidelying:  clam with YTB Seated:  Dorsiflexion with yellow theraband 10x5 Hamstring curls against YTB resistance 15x 5" Hip flexion to abduction/adduction over 6in hurdle 20x STS with appropriate arm positions 10x Standing:  Toe tapping alternating onto 6in step height 10x with 1 HHA and SBA Heel raises 10x 3"     03/06/24: DGI 1. Gait level surface (0) Severe Impairment: Cannot walk 20' without assistance, severe gait deviations or imbalance. 2. Change in gait speed (0) Severe Impairment: Cannot change speeds, or loses balance and has to reach for wall or be caught. 3. Gait with horizontal head turns (1) Moderate Impairment: Performs head turns with moderate change in gait velocity, slows down, staggers but recovers, can continue to walk. 4. Gait with vertical head turns (0) Severe Impairment: Performs task with severe disruption of gait, i.e., staggers outside 15" path, loses balance, stops, reaches for wall. 5. Gait and pivot turn (1) Moderate Impairment: Turns slowly, requires verbal cueing, requires several small steps to catch balance following turn and stop. 6. Step over obstacle (1) Moderate Impairment: Is able to step over box but must stop, then step over. May require verbal cueing. 7. Step around obstacles (1) Moderate Impairment: Is able to clear cones but must significantly slow, speed to accomplish task, or requires verbal cueing. 8. Stairs (1) Moderate  Impairment: Two feet to a stair, must use rail.  TOTAL SCORE: 5 / 24  Educated benefits with compression garments for edema control Seated: Dorsiflexion with yellow theraband 10x5 STS with cueing for proper hand and foot placement 5x  Sidelying: clam with YTB Gait training with cueing for standing within the walker, heel to toe mechanics and equal stride length with frequent cueing to advance Lt LE.    03/04/24 Sit to stands with 2" foam in seat 5X no UE CGA Seated LAQ 10X5" each LE Supine bridge 2X10  SLR 10X each  LTR 10X  Knee to chest with towel 5X10" each side Goal review, POC moving forward Ambulation around clinic working on heel toe gait (tends to drag Lt foot)  02/21/2024  Evaluation: -ROM measured, Strength assessed, HEP prescribed, pt educated on prognosis, findings, and importance of HEP compliance if given.    PATIENT EDUCATION: Education details: Pt was educated on findings of PT evaluation, prognosis, frequency of therapy visits and rationale, attendance policy, and HEP if given.   Person educated: Patient and Spouse Education method: Explanation, Verbal cues, and Handouts Education comprehension: verbalized understanding and needs further education  HOME EXERCISE PROGRAM: Access Code: [email or web address] URL: [email or web address] Date: 02/21/2024 Prepared by: Armond Bertin  Exercises - Sit to Stand with Armchair  - 1 x daily - 7 x weekly - 2 sets - 10 reps - Standing Hip Extension with Counter Support  - 1 x daily - 7 x weekly - 3 sets - 10 reps - Standing Hip Abduction with Counter Support  - 1 x daily - 7 x weekly - 3 sets - 10 reps  03/12/24: - Supine Bridge  - 2 x daily - 7 x weekly - 1 sets - 10 reps - 5" hold - Clam with Resistance  - 2  x daily - 7 x weekly - 1 sets - 10 reps - 5" hold  GOALS: Goals reviewed with patient? Yes  SHORT TERM GOALS: Target date: 03/13/24  Patient will demonstrate evidence of independence with individualized HEP  and will report compliance for at least 3 days per week for optimized progression towards remaining therapy goals. Baseline:  Goal status: IN PROGRESS  2.  Patient will report a decrease in pain level during community ambulation by at least 2 points for improved quality of life. Baseline: 7/10 Goal status: IN PROGRESS     LONG TERM GOALS: Target date: 04/03/24  Pt will demonstrate a an increase of at least 10 seconds bilaterally during static tandem stance for decreased falls risk. Baseline: about 2 seconds per side Goal status: IN PROGRESS  2.  Pt will improve 2 MWT by 140 feet in order to demonstrate improved functional ambulatory capacity in community setting.  Baseline: 80 feet Goal status: IN PROGRESS  3.  Pt will demonstrate ability to manage RW in more cautious manner by keeping one foot in walker area during ambulation for decreased anterior lean and decreased UE demand for support during ambulation for improved independence with ambulation. Baseline: currently walker gets away from him and he is heavily dependent on UEs Goal status: IN PROGRESS  4.  Pt will demonstrate at least 4-/5 MMT for left lower extremity for increased strength during ADL and community ambulation. Baseline: see above Goal status: IN PROGRESS   ASSESSMENT:  CLINICAL IMPRESSION: Reports of increased Lt hip pain and presents with limited mobility at entrance.  Began session on Nustep for dynamic warm up.  Presents with shuffled gait, cueing to increase step length and heel to toe mechanics.  Reports of increased pain in standing so majority session focus on mat activities for gluteal strengthening.  Presents with decreased activity tolerance and increased time and cueing with majority of exercises.  When reviewed HEP compliance pt reports he has not completed any since last session, educated importance of HEP compliance to assist with weakness as weakness leads to pain.  Evaluation:  Patient is a 78 y.o.  male who was seen today for physical therapy evaluation and treatment for R29.6 (ICD-10-CM) - Repeated falls.  Patient demonstrates decreased LE strength, abnormal pain rating, and impaired balance. Patient also demonstrates difficulty with ambulation during today's session with significant decrease in stride length and velocity noted. Patient also demonstrates left sided weakness especially in LLE compared to the RLE. Patient requires VC for proper management of RW during ambulation and is lacking in ambulation tolerance overall. Patient would benefit from skilled physical therapy for increased endurance with ambulation, increased LE strength, and balance for improved gait quality, return to higher level of function with ADLs, and progress towards therapy goals.   OBJECTIVE IMPAIRMENTS: Abnormal gait, decreased balance, decreased endurance, decreased knowledge of use of DME, decreased mobility, difficulty walking, decreased strength, and pain.   ACTIVITY LIMITATIONS: carrying, lifting, bending, standing, squatting, stairs, and locomotion level  PARTICIPATION LIMITATIONS: cleaning, medication management, driving, shopping, community activity, occupation, and yard work  PERSONAL FACTORS: Age, Past/current experiences, Time since onset of injury/illness/exacerbation, and 1-2 comorbidities: heart surgery, gall bladder procedure, stroke  are also affecting patient's functional outcome.   REHAB POTENTIAL: Good  CLINICAL DECISION MAKING: Evolving/moderate complexity  EVALUATION COMPLEXITY: Moderate   PLAN:  PT FREQUENCY: 2x/week  PT DURATION: 6 weeks  PLANNED INTERVENTIONS: 97110-Therapeutic exercises, 97530- Therapeutic activity, [id number]- Neuromuscular re-education, 97535- Self Care, [id number]- Manual therapy, [id number]- Gait  training, Patient/Family education, Balance training, and Stair training  PLAN FOR NEXT SESSION:  Continue to  progress dynamic and static balance, progress bilateral LE  strengthening with focus on LLE  Minor Amble, LPTA/CLT; CBIS [phone]  4:16 PM, 03/18/24

## 2024-03-20 ENCOUNTER — Encounter (HOSPITAL_COMMUNITY): Payer: PRIVATE HEALTH INSURANCE

## 2024-03-25 ENCOUNTER — Encounter (HOSPITAL_COMMUNITY): Payer: PRIVATE HEALTH INSURANCE

## 2024-03-27 ENCOUNTER — Ambulatory Visit (HOSPITAL_COMMUNITY): Payer: PRIVATE HEALTH INSURANCE

## 2024-03-27 ENCOUNTER — Encounter (HOSPITAL_COMMUNITY): Payer: Self-pay

## 2024-03-27 DIAGNOSIS — R2689 Other abnormalities of gait and mobility: Secondary | ICD-10-CM

## 2024-03-27 DIAGNOSIS — Z7409 Other reduced mobility: Secondary | ICD-10-CM

## 2024-03-27 DIAGNOSIS — R531 Weakness: Secondary | ICD-10-CM

## 2024-03-27 NOTE — Therapy (Signed)
 OUTPATIENT PHYSICAL THERAPY BALANCE/VESTIBULAR TREATMENT     Patient Name: Hunter Sparks MRN: [id number] DOB:05-Nov-1946, 78 y.o., male Today's Date: 03/27/2024  END OF SESSION:  PT End of Session - 03/27/24 1421     Visit Number 7    Number of Visits 12    Date for PT Re-Evaluation 04/03/24    Authorization Type [employer]    Authorization Time Period no auth    Progress Note Due on Visit 10    PT Start Time 1421    PT Stop Time 1505    PT Time Calculation (min) 44 min    Equipment Utilized During Treatment Gait belt    Activity Tolerance Patient tolerated treatment well;Patient limited by pain    Behavior During Therapy WFL for tasks assessed/performed                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HCC)    Arthritis    "back, hips, legs" (06/10/2015)   Chest pain    Chronic lower back pain    Coronary artery disease    Depression    DJD (degenerative joint disease)    History of kidney stones    Hyperlipidemia 06/11/2015   Hypertension    Hypothyroidism    Stroke (HCC)    Type II diabetes mellitus (HCC)    Past Surgical History:  Procedure Laterality Date   CARDIAC CATHETERIZATION N/A 06/11/2015   Procedure: Left Heart Cath and Coronary Angiography;  Surgeon: Arty Binning, MD;  Location: [facility];  Service: Cardiovascular;  Laterality: N/A;   CHOLECYSTECTOMY N/A 09/18/2023   Procedure: LAPAROSCOPIC CHOLECYSTECTOMY;  Surgeon: Shela Derby, MD;  Location: [facility] OR;  Service: General;  Laterality: N/A;   COLONOSCOPY  06/21/2011   Procedure: COLONOSCOPY;  Surgeon: Ruby Corporal, MD;  Location: [facility];  Service: Endoscopy;  Laterality: N/A;   COLONOSCOPY     COLONOSCOPY N/A 09/20/2017   Procedure: COLONOSCOPY;  Surgeon: Ruby Corporal, MD;  Location: [facility];  Service: Endoscopy;  Laterality: N/A;  1200   CORONARY ARTERY BYPASS GRAFT N/A 06/14/2015   Procedure: CORONARY ARTERY BYPASS GRAFTING  times four using Left Internal mammary artery and right leg Saphenous vein graft;  Surgeon: Heriberto London, MD;  Location: [facility] OR;  Service: Open Heart Surgery;  Laterality: N/A;   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   HAND SURGERY     IR EXCHANGE BILIARY DRAIN  07/31/2023   IR EXCHANGE BILIARY DRAIN  08/23/2023   IR PERC CHOLECYSTOSTOMY  07/03/2023   SKIN CANCER EXCISION Left    "forearm"   TEE WITHOUT CARDIOVERSION N/A 06/14/2015   Procedure: TRANSESOPHAGEAL ECHOCARDIOGRAM (TEE);  Surgeon: Heriberto London, MD;  Location: [facility] OR;  Service: Open Heart Surgery;  Laterality: N/A;   Patient Active Problem List   Diagnosis Date Noted   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) 07/31/2023   Urinary incontinence 07/27/2023   Acute CVA (cerebrovascular accident) (HCC) 07/14/2023   Protein-calorie malnutrition, severe 07/07/2023   Cholecystitis 07/03/2023   Moderate cognitive impairment 07/02/2023   Acute abdominal pain 07/02/2023   Sepsis with acute organ dysfunction without septic shock (HCC) 07/02/2023   Hemiparesis affecting left side as late effect of cerebrovascular accident (CVA) (HCC) 07/02/2023   Cerebral ischemic stroke due to global hypoperfusion with watershed infarct [facility]) 06/23/2023   Stroke (cerebrum) (HCC) 06/22/2023   Left-sided weakness 06/21/2023  CKD stage 3a, GFR 45-59 ml/min (HCC) - Baseline scr 1.3-1.5 06/21/2023   Major depressive disorder 08/08/2021   Hx of colonic polyps 06/13/2017   Family hx of colon cancer 06/13/2017   Vitamin D  insufficiency 01/04/2017   Insulin  dependent type 2 diabetes mellitus (HCC) 09/01/2015   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   Mixed hyperlipidemia 06/11/2015   Essential hypertension    Coronary artery disease involving native coronary artery of native heart with unstable angina pectoris (HCC)    Angina, class II (HCC) 06/10/2015   Hypothyroidism 06/10/2015   History of  tobacco use 06/10/2015   Chronic low back pain 06/10/2015    PCP: Omie Bickers, MD  REFERRING PROVIDER: Harriet Limber, FNP  REFERRING DIAG: R29.6 (ICD-10-CM) - Repeated falls  THERAPY DIAG:  Other abnormalities of gait and mobility  Impaired functional mobility, balance, and endurance  Left-sided weakness  ONSET DATE: 8 months ago, following stroke  Rationale for Evaluation and Treatment: Rehabilitation  SUBJECTIVE:   SUBJECTIVE STATEMENT: Pt reports he has been having increased difficulty getting around feels like he must have done something to his back. Pt presents with decreased gait speed and left foot clearance.   Evaluation:  Pt reports pain rating of 7-8/10 upon presentation in low back. Pts wife joins in subjective section with pt showing decreased memory of his past medical history. Pt reported to have severe degenerative bone disease of back, had to retire because of back pain. Pt suffered a stroke 8 months ago, and left side weakness and decreased walking ability were noticed and spent some time in the inpatient setting for recovery. Pt then had at least one sx concerning gall bladder, September 21st. Pt is reported to get confused on occasion and has gotten slower getting up and down, somedays are better than others. Pt accompanied by: significant other  PERTINENT HISTORY:  -History of stroke -Quadruple bypass 2017? -Gall bladder procedure (s)  PAIN:  Are you having pain? Yes: NPRS scale: 7-8/10 Pain location: low back Pain description: aching Aggravating factors: standing, sitting in one position Relieving factors: lying down  PRECAUTIONS: Fall  RED FLAGS: None More frequent reported  WEIGHT BEARING RESTRICTIONS: No  FALLS: Has patient fallen in last 6 months? Yes. Number of falls 1-2  LIVING ENVIRONMENT: Lives with: lives with their spouse Lives in: House/apartment Stairs: No, ramp Has following equipment at home: Walker - 2 wheeled  PLOF:  Independent with basic ADLs  PATIENT GOALS: walk better, get back on tractor, lawn mower, normalcy back  OBJECTIVE:  Note: Objective measures were completed at Evaluation unless otherwise noted.  DIAGNOSTIC FINDINGS:  MRI of Brain 07/02/23: CLINICAL DATA:  Mental status change, unknown cause.   EXAM: MRI HEAD WITHOUT CONTRAST   TECHNIQUE: Multiplanar, multiecho pulse sequences of the brain and surrounding structures were obtained without intravenous contrast.   COMPARISON:  Head MRI and MRA 06/21/2023   FINDINGS: The study is intermittently moderately motion degraded.   Brain: Small acute to early subacute right cerebral hemispheric infarcts described on the prior MRI have evolved in the interim. Mildly restricted diffusion remains at the site of the posterior right basal ganglia infarct. No new infarct, mass, midline shift, or extra-axial fluid collection is identified. Patchy T2 hyperintensities in the cerebral white matter bilaterally are unchanged and nonspecific but compatible with moderate chronic small vessel ischemic disease. Chronic infarcts are again noted in the right basal ganglia, left corona radiata, pons, and both cerebellar hemispheres a chronic microhemorrhage in the right occipital  lobe is unchanged. There is moderate cerebral atrophy.   Vascular: Abnormal appearance of the distal left vertebral artery, more fully evaluated on the recent MRA.   Skull and upper cervical spine: No suspicious marrow lesion.   Sinuses/Orbits: Unremarkable orbits. Paranasal sinuses and mastoid air cells are clear.   Other: None.   IMPRESSION: 1. Interval evolution of the previously described recent infarcts. 2. No new infarct or other acute intracranial abnormality. 3. Moderate chronic small vessel ischemic disease with multiple chronic infarcts as above.  COGNITION: Overall cognitive status: Within functional limits for tasks assessed   SENSATION: Light touch:  Impaired    POSTURE:  rounded shoulders, forward head, decreased lumbar lordosis, and increased thoracic kyphosis   LOWER EXTREMITY MMT:   MMT Right eval Left eval  Hip flexion 4 3+  Hip abduction 4 3-  Hip adduction 4 3-  Hip internal rotation    Hip external rotation    Knee flexion 4 3-  Knee extension 4 3  Ankle dorsiflexion 4+ 3+  Ankle plantarflexion    Ankle inversion    Ankle eversion    (Blank rows = not tested)  GAIT: Gait pattern: step to pattern, decreased step length- Right, decreased step length- Left, decreased stance time- Left, decreased stride length, decreased hip/knee flexion- Left, decreased ankle dorsiflexion- Left, and shuffling Distance walked: 80 feet Assistive device utilized: Walker - 2 wheeled Level of assistance: Modified independence Comments: Pt demonstrates decreased progression of gait due to left LE weakness.  FUNCTIONAL TESTS:  5 times sit to stand: 2 in 30 seconds 2 minute walk test: 80 feet with RW RLE balance better during balance assessment PATIENT SURVEYS:  None completed                                                                                                                              TREATMENT DATE:  03/27/2024  Therapeutic Exercise: -Nustep, level 1, 5 minutes -Supine bridges 1 sets of 5 reps, pt cued for max hip extension -LAQ, pt cued for eccentric control, 3 sets of 5,  -Heel/Toe raises, 2 sets of 10 reps, pt cued for max ROM -Single knee to chest 1 set of 3 reps, pt cued to remain in pain free ROM, right LE only -LTR 2 set of 10 reps bilaterally, pt cued to remain in pain free ROM Manual Therapy -CPA and UPA of lumbar spine, L1-L5, pts lower lumbar segments are extremly stiff and notable drop off observed at about the L4 level. -STM of lumbar paraspinals -Manual lumbar traction in supine, no symptom reduction noted  -MHP applied to lumbar spine for 5 minutes at the end of therapy in hooklying for symptom  reduction   03/18/24: Nustep [place] dynamic warm up with UE/LE  Gait training 75x 2 with cueing to increase stride length, heel to toe mechanics especially Lt foot Sidelying:  Clam RTB 2x 10 5" Supine bridge 10x 5" with RTB around thighs  STS with appropriate arm position (cueing not to pull on RW but to push from chair) 2x 5  03/14/2024  Therapeutic Exercise: -Supine bridges 2 sets of 10 reps, 3 second holds, pt cued for max hip extension -LAQ, 3# AW, pt cued for eccentric control, 3 sets of 5,  -Standing marches with 3# AW, 2 sets, 30 minute bouts, pt cued for increased pace -Lumbar blue exercise rollouts in chair, 2 sets of 4 reps -Aeromat walks, 1 lap of lateral stepping and tandem walking, in // bars, pt cued for decreased UE support, BUE used majority of time.  Therapeutic Activity: -Sit to stands, staggered, 2 sets of 10 reps, pt cued for core activation -Step up and overs, with 3# AW, 1 set of 5 reps -Lateral step up and overs, with 3# AW, 1 set of 5 reps    PATIENT EDUCATION: Education details: Pt was educated on findings of PT evaluation, prognosis, frequency of therapy visits and rationale, attendance policy, and HEP if given.   Person educated: Patient and Spouse Education method: Explanation, Verbal cues, and Handouts Education comprehension: verbalized understanding and needs further education  HOME EXERCISE PROGRAM: Access Code: [email or web address] URL: [email or web address] Date: 02/21/2024 Prepared by: Armond Bertin  Exercises - Sit to Stand with Armchair  - 1 x daily - 7 x weekly - 2 sets - 10 reps - Standing Hip Extension with Counter Support  - 1 x daily - 7 x weekly - 3 sets - 10 reps - Standing Hip Abduction with Counter Support  - 1 x daily - 7 x weekly - 3 sets - 10 reps  03/12/24: - Supine Bridge  - 2 x daily - 7 x weekly - 1 sets - 10 reps - 5" hold - Clam with Resistance  - 2 x daily - 7 x weekly - 1 sets - 10 reps - 5"  hold  GOALS: Goals reviewed with patient? Yes  SHORT TERM GOALS: Target date: 03/13/24  Patient will demonstrate evidence of independence with individualized HEP and will report compliance for at least 3 days per week for optimized progression towards remaining therapy goals. Baseline:  Goal status: IN PROGRESS  2.  Patient will report a decrease in pain level during community ambulation by at least 2 points for improved quality of life. Baseline: 7/10 Goal status: IN PROGRESS     LONG TERM GOALS: Target date: 04/03/24  Pt will demonstrate a an increase of at least 10 seconds bilaterally during static tandem stance for decreased falls risk. Baseline: about 2 seconds per side Goal status: IN PROGRESS  2.  Pt will improve 2 MWT by 140 feet in order to demonstrate improved functional ambulatory capacity in community setting.  Baseline: 80 feet Goal status: IN PROGRESS  3.  Pt will demonstrate ability to manage RW in more cautious manner by keeping one foot in walker area during ambulation for decreased anterior lean and decreased UE demand for support during ambulation for improved independence with ambulation. Baseline: currently walker gets away from him and he is heavily dependent on UEs Goal status: IN PROGRESS  4.  Pt will demonstrate at least 4-/5 MMT for left lower extremity for increased strength during ADL and community ambulation. Baseline: see above Goal status: IN PROGRESS   ASSESSMENT:  CLINICAL IMPRESSION: Patient continues to demonstrate significant low back pain, decreased LE and core strength, decreased gait quality and balance. Patient also demonstrates decreased endurance with ambulatoin with increased left foot dragging noted. Session was regressed today due  to increased soreness from previous sessions and pt stating he has not been getting around as well at his house. Pt states the only thing that helps his back is if wife sits on his back. Pt reports manual  therapy does not increase the pain but does not decrease the pain either. Will continue to track progress/decline next session and consider messaging doctor about pts regression if symptoms worsen. Patient would continue to benefit from skilled physical therapy for decreased low back pain, increased endurance with ambulation, increased LE strength, and improved balance for improved quality of life, improved independence with gait training and continued progress towards therapy goals.   Evaluation:  Patient is a 78 y.o. male who was seen today for physical therapy evaluation and treatment for R29.6 (ICD-10-CM) - Repeated falls.  Patient demonstrates decreased LE strength, abnormal pain rating, and impaired balance. Patient also demonstrates difficulty with ambulation during today's session with significant decrease in stride length and velocity noted. Patient also demonstrates left sided weakness especially in LLE compared to the RLE. Patient requires VC for proper management of RW during ambulation and is lacking in ambulation tolerance overall. Patient would benefit from skilled physical therapy for increased endurance with ambulation, increased LE strength, and balance for improved gait quality, return to higher level of function with ADLs, and progress towards therapy goals.   OBJECTIVE IMPAIRMENTS: Abnormal gait, decreased balance, decreased endurance, decreased knowledge of use of DME, decreased mobility, difficulty walking, decreased strength, and pain.   ACTIVITY LIMITATIONS: carrying, lifting, bending, standing, squatting, stairs, and locomotion level  PARTICIPATION LIMITATIONS: cleaning, medication management, driving, shopping, community activity, occupation, and yard work  PERSONAL FACTORS: Age, Past/current experiences, Time since onset of injury/illness/exacerbation, and 1-2 comorbidities: heart surgery, gall bladder procedure, stroke are also affecting patient's functional outcome.    REHAB POTENTIAL: Good  CLINICAL DECISION MAKING: Evolving/moderate complexity  EVALUATION COMPLEXITY: Moderate   PLAN:  PT FREQUENCY: 2x/week  PT DURATION: 6 weeks  PLANNED INTERVENTIONS: 97110-Therapeutic exercises, 97530- Therapeutic activity, 97112- Neuromuscular re-education, 97535- Self Care, 16109- Manual therapy, [id number]- Gait training, Patient/Family education, Balance training, and Stair training  PLAN FOR NEXT SESSION:  Continue to  progress dynamic and static balance, progress bilateral LE strengthening with focus on LLE  Armond Bertin, PT, DPT [facility] Office: [phone] 5:36 PM, 03/27/24

## 2024-03-30 DIAGNOSIS — I639 Cerebral infarction, unspecified: Secondary | ICD-10-CM | POA: Diagnosis not present

## 2024-03-31 ENCOUNTER — Other Ambulatory Visit (HOSPITAL_COMMUNITY): Payer: Self-pay | Admitting: Nurse Practitioner

## 2024-03-31 DIAGNOSIS — R2689 Other abnormalities of gait and mobility: Secondary | ICD-10-CM

## 2024-03-31 DIAGNOSIS — E782 Mixed hyperlipidemia: Secondary | ICD-10-CM | POA: Diagnosis not present

## 2024-03-31 DIAGNOSIS — E1165 Type 2 diabetes mellitus with hyperglycemia: Secondary | ICD-10-CM | POA: Diagnosis not present

## 2024-03-31 DIAGNOSIS — N1831 Chronic kidney disease, stage 3a: Secondary | ICD-10-CM | POA: Diagnosis not present

## 2024-03-31 DIAGNOSIS — E039 Hypothyroidism, unspecified: Secondary | ICD-10-CM | POA: Diagnosis not present

## 2024-04-01 ENCOUNTER — Encounter (HOSPITAL_COMMUNITY): Payer: PRIVATE HEALTH INSURANCE

## 2024-04-02 ENCOUNTER — Ambulatory Visit (HOSPITAL_COMMUNITY)
Admission: RE | Admit: 2024-04-02 | Discharge: 2024-04-02 | Disposition: A | Discharge status: Home / Self Care | Source: Ambulatory Visit | Attending: Nurse Practitioner | Admitting: Nurse Practitioner

## 2024-04-02 DIAGNOSIS — R2689 Other abnormalities of gait and mobility: Secondary | ICD-10-CM | POA: Insufficient documentation

## 2024-04-02 DIAGNOSIS — N281 Cyst of kidney, acquired: Secondary | ICD-10-CM | POA: Diagnosis not present

## 2024-04-02 DIAGNOSIS — M47816 Spondylosis without myelopathy or radiculopathy, lumbar region: Secondary | ICD-10-CM | POA: Diagnosis not present

## 2024-04-02 DIAGNOSIS — M48061 Spinal stenosis, lumbar region without neurogenic claudication: Secondary | ICD-10-CM | POA: Diagnosis not present

## 2024-04-02 DIAGNOSIS — M47817 Spondylosis without myelopathy or radiculopathy, lumbosacral region: Secondary | ICD-10-CM | POA: Diagnosis not present

## 2024-04-03 ENCOUNTER — Encounter (HOSPITAL_COMMUNITY): Payer: Self-pay

## 2024-04-03 ENCOUNTER — Ambulatory Visit (HOSPITAL_COMMUNITY): Payer: PRIVATE HEALTH INSURANCE

## 2024-04-03 DIAGNOSIS — R2689 Other abnormalities of gait and mobility: Secondary | ICD-10-CM

## 2024-04-03 DIAGNOSIS — Z7409 Other reduced mobility: Secondary | ICD-10-CM

## 2024-04-03 DIAGNOSIS — R531 Weakness: Secondary | ICD-10-CM

## 2024-04-03 NOTE — Therapy (Addendum)
 OUTPATIENT PHYSICAL THERAPY BALANCE/VESTIBULAR PROGRESS/DISCHARGE NOTE    PHYSICAL THERAPY DISCHARGE SUMMARY  Visits from Start of Care: 6  Current functional level related to goals / functional outcomes: Decreased functional level due to low back pain.   Remaining deficits: Balance, LE strength, mobility deficits remain, all limited by low back pain at this time.   Education / Equipment: Pt educated on the importance of HEP and functional mobility, POC and referral back to doctor due to therapy limitations of low back pain.   Patient agrees to discharge. Patient goals were not met. Patient is being discharged due to did not respond to therapy.     Patient Name: Hunter Sparks MRN: [id number] DOB:1946/02/19, 78 y.o., male Today's Date: 04/03/2024  END OF SESSION:  PT End of Session - 04/03/24 1515     Visit Number 8    Number of Visits 12    Authorization Type [employer]    Authorization Time Period no auth    Progress Note Due on Visit 10    PT Start Time 1515    PT Stop Time 1538    PT Time Calculation (min) 23 min    Equipment Utilized During Treatment Gait belt    Activity Tolerance Patient limited by pain    Behavior During Therapy WFL for tasks assessed/performed                 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HCC)    Arthritis    "back, hips, legs" (06/10/2015)   Chest pain    Chronic lower back pain    Coronary artery disease    Depression    DJD (degenerative joint disease)    History of kidney stones    Hyperlipidemia 06/11/2015   Hypertension    Hypothyroidism    Stroke (HCC)    Type II diabetes mellitus (HCC)    Past Surgical History:  Procedure Laterality Date   CARDIAC CATHETERIZATION N/A 06/11/2015   Procedure: Left Heart Cath and Coronary Angiography;  Surgeon: Arty Binning, MD;  Location: [facility] INVASIVE CV LAB;  Service: Cardiovascular;  Laterality: N/A;   CHOLECYSTECTOMY N/A 09/18/2023    Procedure: LAPAROSCOPIC CHOLECYSTECTOMY;  Surgeon: Shela Derby, MD;  Location: [facility] OR;  Service: General;  Laterality: N/A;   COLONOSCOPY  06/21/2011   Procedure: COLONOSCOPY;  Surgeon: Ruby Corporal, MD;  Location: [facility];  Service: Endoscopy;  Laterality: N/A;   COLONOSCOPY     COLONOSCOPY N/A 09/20/2017   Procedure: COLONOSCOPY;  Surgeon: Ruby Corporal, MD;  Location: [facility];  Service: Endoscopy;  Laterality: N/A;  1200   CORONARY ARTERY BYPASS GRAFT N/A 06/14/2015   Procedure: CORONARY ARTERY BYPASS GRAFTING times four using Left Internal mammary artery and right leg Saphenous vein graft;  Surgeon: Heriberto London, MD;  Location: [facility] OR;  Service: Open Heart Surgery;  Laterality: N/A;   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   HAND SURGERY     IR EXCHANGE BILIARY DRAIN  07/31/2023   IR EXCHANGE BILIARY DRAIN  08/23/2023   IR PERC CHOLECYSTOSTOMY  07/03/2023   SKIN CANCER EXCISION Left    "forearm"   TEE WITHOUT CARDIOVERSION N/A 06/14/2015   Procedure: TRANSESOPHAGEAL ECHOCARDIOGRAM (TEE);  Surgeon: Heriberto London, MD;  Location: [facility] OR;  Service: Open Heart Surgery;  Laterality: N/A;   Patient Active Problem List   Diagnosis Date Noted   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) 07/31/2023   Urinary incontinence 07/27/2023   Acute CVA (cerebrovascular accident) (HCC) 07/14/2023   Protein-calorie malnutrition, severe 07/07/2023   Cholecystitis 07/03/2023   Moderate cognitive impairment 07/02/2023   Acute abdominal pain 07/02/2023   Sepsis with acute organ dysfunction without septic shock (HCC) 07/02/2023   Hemiparesis affecting left side as late effect of cerebrovascular accident (CVA) (HCC) 07/02/2023   Cerebral ischemic stroke due to global hypoperfusion with watershed infarct [facility]) 06/23/2023   Stroke (cerebrum) (HCC) 06/22/2023   Left-sided weakness 06/21/2023   CKD stage 3a, GFR 45-59 ml/min  (HCC) - Baseline scr 1.3-1.5 06/21/2023   Major depressive disorder 08/08/2021   Hx of colonic polyps 06/13/2017   Family hx of colon cancer 06/13/2017   Vitamin D  insufficiency 01/04/2017   Insulin  dependent type 2 diabetes mellitus (HCC) 09/01/2015   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   Mixed hyperlipidemia 06/11/2015   Essential hypertension    Coronary artery disease involving native coronary artery of native heart with unstable angina pectoris (HCC)    Angina, class II (HCC) 06/10/2015   Hypothyroidism 06/10/2015   History of tobacco use 06/10/2015   Chronic low back pain 06/10/2015    PCP: Omie Bickers, MD  REFERRING PROVIDER: Harriet Limber, FNP  REFERRING DIAG: R29.6 (ICD-10-CM) - Repeated falls  THERAPY DIAG:  Other abnormalities of gait and mobility  Impaired functional mobility, balance, and endurance  Left-sided weakness  ONSET DATE: 8 months ago, following stroke  Rationale for Evaluation and Treatment: Rehabilitation  SUBJECTIVE:   SUBJECTIVE STATEMENT: Pt states he feels he is getting worse. Pt states the tramadol  is causing him to sleep about all day and keeping him up all night. Pt states he has had increased difficulty with mobility and has not been able to perform HEP due to the increased low back pain. Pt states 9/10 pain today.  Evaluation:  Pt reports pain rating of 7-8/10 upon presentation in low back. Pts wife joins in subjective section with pt showing decreased memory of his past medical history. Pt reported to have severe degenerative bone disease of back, had to retire because of back pain. Pt suffered a stroke 8 months ago, and left side weakness and decreased walking ability were noticed and spent some time in the inpatient setting for recovery. Pt then had at least one sx concerning gall bladder, September 21st. Pt is reported to get confused on occasion and has gotten slower getting up and down, somedays are better than others. Pt accompanied by:  significant other  PERTINENT HISTORY:  -History of stroke -Quadruple bypass 2017? -Gall bladder procedure (s)  PAIN:  Are you having pain? Yes: NPRS scale: 7-8/10 Pain location: low back Pain description: aching Aggravating factors: standing, sitting in one position Relieving factors: lying down  PRECAUTIONS: Fall  RED FLAGS: None More frequent reported  WEIGHT BEARING RESTRICTIONS: No  FALLS: Has patient fallen in last 6 months? Yes. Number of falls 1-2  LIVING ENVIRONMENT: Lives with: lives with their spouse Lives in: House/apartment Stairs: No, ramp Has following equipment at home: Walker - 2 wheeled  PLOF: Independent with basic ADLs  PATIENT GOALS: walk better, get back on tractor, lawn mower, normalcy back  OBJECTIVE:  Note: Objective measures were completed at Evaluation unless otherwise noted.  DIAGNOSTIC FINDINGS:  MRI of Brain 07/02/23: CLINICAL DATA:  Mental status change, unknown cause.   EXAM: MRI HEAD WITHOUT CONTRAST   TECHNIQUE: Multiplanar, multiecho pulse sequences of the brain and surrounding structures were  obtained without intravenous contrast.   COMPARISON:  Head MRI and MRA 06/21/2023   FINDINGS: The study is intermittently moderately motion degraded.   Brain: Small acute to early subacute right cerebral hemispheric infarcts described on the prior MRI have evolved in the interim. Mildly restricted diffusion remains at the site of the posterior right basal ganglia infarct. No new infarct, mass, midline shift, or extra-axial fluid collection is identified. Patchy T2 hyperintensities in the cerebral white matter bilaterally are unchanged and nonspecific but compatible with moderate chronic small vessel ischemic disease. Chronic infarcts are again noted in the right basal ganglia, left corona radiata, pons, and both cerebellar hemispheres a chronic microhemorrhage in the right occipital lobe is unchanged. There is moderate cerebral  atrophy.   Vascular: Abnormal appearance of the distal left vertebral artery, more fully evaluated on the recent MRA.   Skull and upper cervical spine: No suspicious marrow lesion.   Sinuses/Orbits: Unremarkable orbits. Paranasal sinuses and mastoid air cells are clear.   Other: None.   IMPRESSION: 1. Interval evolution of the previously described recent infarcts. 2. No new infarct or other acute intracranial abnormality. 3. Moderate chronic small vessel ischemic disease with multiple chronic infarcts as above.  COGNITION: Overall cognitive status: Within functional limits for tasks assessed   SENSATION: Light touch: Impaired    POSTURE:  rounded shoulders, forward head, decreased lumbar lordosis, and increased thoracic kyphosis   LOWER EXTREMITY MMT:   MMT Right eval Left eval  Hip flexion 4 3+  Hip abduction 4 3-  Hip adduction 4 3-  Hip internal rotation    Hip external rotation    Knee flexion 4 3-  Knee extension 4 3  Ankle dorsiflexion 4+ 3+  Ankle plantarflexion    Ankle inversion    Ankle eversion    (Blank rows = not tested)  GAIT: Gait pattern: step to pattern, decreased step length- Right, decreased step length- Left, decreased stance time- Left, decreased stride length, decreased hip/knee flexion- Left, decreased ankle dorsiflexion- Left, and shuffling Distance walked: 80 feet Assistive device utilized: Walker - 2 wheeled Level of assistance: Modified independence Comments: Pt demonstrates decreased progression of gait due to left LE weakness.  FUNCTIONAL TESTS:  5 times sit to stand: 2 in 30 seconds 2 minute walk test: 80 feet with RW RLE balance better during balance assessment PATIENT SURVEYS:  None completed                                                                                                                              TREATMENT DATE:  04/03/2024  Therapeutic Activities: -Progress note: TUG assessment, goal tracking, HEP  review, discharge rationale explained   03/27/2024  Therapeutic Exercise: -Nustep, level 1, 5 minutes -Supine bridges 1 sets of 5 reps, pt cued for max hip extension -LAQ, pt cued for eccentric control, 3 sets of 5,  -Heel/Toe raises, 2 sets of 10 reps, pt cued for max ROM -Single knee  to chest 1 set of 3 reps, pt cued to remain in pain free ROM, right LE only -LTR 2 set of 10 reps bilaterally, pt cued to remain in pain free ROM Manual Therapy -CPA and UPA of lumbar spine, L1-L5, pts lower lumbar segments are extremly stiff and notable drop off observed at about the L4 level. -STM of lumbar paraspinals -Manual lumbar traction in supine, no symptom reduction noted  -MHP applied to lumbar spine for 5 minutes at the end of therapy in hooklying for symptom reduction   03/18/24: Nustep [place] dynamic warm up with UE/LE  Gait training 75x 2 with cueing to increase stride length, heel to toe mechanics especially Lt foot Sidelying:  Clam RTB 2x 10 5" Supine bridge 10x 5" with RTB around thighs STS with appropriate arm position (cueing not to pull on RW but to push from chair) 2x 5    PATIENT EDUCATION: Education details: Pt was educated on findings of PT evaluation, prognosis, frequency of therapy visits and rationale, attendance policy, and HEP if given.   Person educated: Patient and Spouse Education method: Explanation, Verbal cues, and Handouts Education comprehension: verbalized understanding and needs further education  HOME EXERCISE PROGRAM: Access Code: [email or web address] URL: [email or web address] Date: 02/21/2024 Prepared by: Armond Bertin  Exercises - Sit to Stand with Armchair  - 1 x daily - 7 x weekly - 2 sets - 10 reps - Standing Hip Extension with Counter Support  - 1 x daily - 7 x weekly - 3 sets - 10 reps - Standing Hip Abduction with Counter Support  - 1 x daily - 7 x weekly - 3 sets - 10 reps  03/12/24: - Supine Bridge  - 2 x daily - 7 x weekly - 1  sets - 10 reps - 5" hold - Clam with Resistance  - 2 x daily - 7 x weekly - 1 sets - 10 reps - 5" hold  GOALS: Goals reviewed with patient? Yes  SHORT TERM GOALS: Target date: 03/13/24  Patient will demonstrate evidence of independence with individualized HEP and will report compliance for at least 3 days per week for optimized progression towards remaining therapy goals. Baseline:  Goal status: NOT MET  2.  Patient will report a decrease in pain level during community ambulation by at least 2 points for improved quality of life. Baseline: 7/10 Goal status: NOT MET     LONG TERM GOALS: Target date: 04/03/24  Pt will demonstrate a an increase of at least 10 seconds bilaterally during static tandem stance for decreased falls risk. Baseline: about 2 seconds per side Goal status: NOT MET  2.  Pt will improve 2 MWT by 140 feet in order to demonstrate improved functional ambulatory capacity in community setting.  Baseline: 80 feet, pt unable to complete due to pain and poor clearance of left foot during ambulation Goal status: NOT MET  3.  Pt will demonstrate ability to manage RW in more cautious manner by keeping one foot in walker area during ambulation for decreased anterior lean and decreased UE demand for support during ambulation for improved independence with ambulation. Baseline: currently walker gets away from him and he is heavily dependent on UEs Goal status: NOT MET  4.  Pt will demonstrate at least 4-/5 MMT for left lower extremity for increased strength during ADL and community ambulation. Baseline: see above, pt unable to complete testing due to pain Goal status: NOT MET   ASSESSMENT:  CLINICAL IMPRESSION: Patient continues to demonstrate  increased low back pain, decreased LE strength, decreased gait quality and balance. Patient also demonstrates decreased overall mobility and endurance limited primarily by low back pain. Pt requires 1 minute and 42 seconds to perform  TUG functional task today, signifying significantly impaired mobility, pt mostly limited to low back pain. Patient has not demonstrated any improvements with referring diagnosis, functional mobility and has not had any relief of low back pain with any physical therapy techniques. Due to regression of functional mobility, decreased ambulation and increased low back pain, pt to be discharged from skilled physical therapy and referred back to referring doctor for further evaluation and pain management. Pt educated on the importance of HEP and compliance to pt tolerance. Pt reports having MRI of lumbar spine yesterday with follow up appointment with doctor tomorrow. Hopeful follow up appointment will provide insight into optimal plan of care for this pt as QoL has suffered over the last couple of months.    OBJECTIVE IMPAIRMENTS: Abnormal gait, decreased balance, decreased endurance, decreased knowledge of use of DME, decreased mobility, difficulty walking, decreased strength, and pain.   ACTIVITY LIMITATIONS: carrying, lifting, bending, standing, squatting, stairs, and locomotion level  PARTICIPATION LIMITATIONS: cleaning, medication management, driving, shopping, community activity, occupation, and yard work  PERSONAL FACTORS: Age, Past/current experiences, Time since onset of injury/illness/exacerbation, and 1-2 comorbidities: heart surgery, gall bladder procedure, stroke are also affecting patient's functional outcome.   REHAB POTENTIAL: Good  CLINICAL DECISION MAKING: Evolving/moderate complexity  EVALUATION COMPLEXITY: Moderate   PLAN:  PT FREQUENCY: 2x/week  PT DURATION: 6 weeks  PLANNED INTERVENTIONS: 97110-Therapeutic exercises, 97530- Therapeutic activity, 97112- Neuromuscular re-education, 97535- Self Care, 82956- Manual therapy, [phone]- Gait training, Patient/Family education, Balance training, and Stair training  PLAN FOR NEXT SESSION:  Continue to  progress dynamic and static  balance, progress bilateral LE strengthening with focus on LLE  Armond Bertin, PT, DPT [facility] Office: [phone] 4:04 PM, 04/03/24

## 2024-04-04 DIAGNOSIS — M545 Low back pain, unspecified: Secondary | ICD-10-CM | POA: Diagnosis not present

## 2024-04-04 DIAGNOSIS — I25810 Atherosclerosis of coronary artery bypass graft(s) without angina pectoris: Secondary | ICD-10-CM | POA: Diagnosis not present

## 2024-04-04 DIAGNOSIS — R32 Unspecified urinary incontinence: Secondary | ICD-10-CM | POA: Diagnosis not present

## 2024-04-04 DIAGNOSIS — G3184 Mild cognitive impairment, so stated: Secondary | ICD-10-CM | POA: Diagnosis not present

## 2024-04-04 DIAGNOSIS — E039 Hypothyroidism, unspecified: Secondary | ICD-10-CM | POA: Diagnosis not present

## 2024-04-04 DIAGNOSIS — G720 Drug-induced myopathy: Secondary | ICD-10-CM | POA: Diagnosis not present

## 2024-04-04 DIAGNOSIS — E782 Mixed hyperlipidemia: Secondary | ICD-10-CM | POA: Diagnosis not present

## 2024-04-04 DIAGNOSIS — N1831 Chronic kidney disease, stage 3a: Secondary | ICD-10-CM | POA: Diagnosis not present

## 2024-04-04 DIAGNOSIS — F32A Depression, unspecified: Secondary | ICD-10-CM | POA: Diagnosis not present

## 2024-04-04 DIAGNOSIS — E1165 Type 2 diabetes mellitus with hyperglycemia: Secondary | ICD-10-CM | POA: Diagnosis not present

## 2024-04-04 DIAGNOSIS — G8929 Other chronic pain: Secondary | ICD-10-CM | POA: Diagnosis not present

## 2024-04-04 DIAGNOSIS — R296 Repeated falls: Secondary | ICD-10-CM | POA: Diagnosis not present

## 2024-04-29 ENCOUNTER — Other Ambulatory Visit (HOSPITAL_COMMUNITY): Payer: Self-pay | Admitting: Nurse Practitioner

## 2024-04-29 DIAGNOSIS — R2689 Other abnormalities of gait and mobility: Secondary | ICD-10-CM

## 2024-04-30 DIAGNOSIS — I639 Cerebral infarction, unspecified: Secondary | ICD-10-CM | POA: Diagnosis not present

## 2024-05-30 DIAGNOSIS — I639 Cerebral infarction, unspecified: Secondary | ICD-10-CM | POA: Diagnosis not present

## 2024-06-06 ENCOUNTER — Other Ambulatory Visit: Payer: Self-pay | Admitting: Cardiology

## 2024-06-16 DIAGNOSIS — H25813 Combined forms of age-related cataract, bilateral: Secondary | ICD-10-CM | POA: Diagnosis not present

## 2024-06-16 DIAGNOSIS — H40013 Open angle with borderline findings, low risk, bilateral: Secondary | ICD-10-CM | POA: Diagnosis not present

## 2024-06-16 DIAGNOSIS — H01002 Unspecified blepharitis right lower eyelid: Secondary | ICD-10-CM | POA: Diagnosis not present

## 2024-06-16 DIAGNOSIS — H01001 Unspecified blepharitis right upper eyelid: Secondary | ICD-10-CM | POA: Diagnosis not present

## 2024-06-16 DIAGNOSIS — H40053 Ocular hypertension, bilateral: Secondary | ICD-10-CM | POA: Diagnosis not present

## 2024-06-16 DIAGNOSIS — H01004 Unspecified blepharitis left upper eyelid: Secondary | ICD-10-CM | POA: Diagnosis not present

## 2024-06-19 DIAGNOSIS — M545 Low back pain, unspecified: Secondary | ICD-10-CM | POA: Diagnosis not present

## 2024-06-19 DIAGNOSIS — M47816 Spondylosis without myelopathy or radiculopathy, lumbar region: Secondary | ICD-10-CM | POA: Diagnosis not present

## 2024-06-26 DIAGNOSIS — D485 Neoplasm of uncertain behavior of skin: Secondary | ICD-10-CM | POA: Diagnosis not present

## 2024-06-26 DIAGNOSIS — L578 Other skin changes due to chronic exposure to nonionizing radiation: Secondary | ICD-10-CM | POA: Diagnosis not present

## 2024-06-26 DIAGNOSIS — Z85828 Personal history of other malignant neoplasm of skin: Secondary | ICD-10-CM | POA: Diagnosis not present

## 2024-06-26 DIAGNOSIS — C441121 Basal cell carcinoma of skin of right upper eyelid, including canthus: Secondary | ICD-10-CM | POA: Diagnosis not present

## 2024-06-26 DIAGNOSIS — L728 Other follicular cysts of the skin and subcutaneous tissue: Secondary | ICD-10-CM | POA: Diagnosis not present

## 2024-06-26 DIAGNOSIS — Z08 Encounter for follow-up examination after completed treatment for malignant neoplasm: Secondary | ICD-10-CM | POA: Diagnosis not present

## 2024-06-30 DIAGNOSIS — I639 Cerebral infarction, unspecified: Secondary | ICD-10-CM | POA: Diagnosis not present

## 2024-07-03 DIAGNOSIS — N1831 Chronic kidney disease, stage 3a: Secondary | ICD-10-CM | POA: Diagnosis not present

## 2024-07-03 DIAGNOSIS — E039 Hypothyroidism, unspecified: Secondary | ICD-10-CM | POA: Diagnosis not present

## 2024-07-03 DIAGNOSIS — E782 Mixed hyperlipidemia: Secondary | ICD-10-CM | POA: Diagnosis not present

## 2024-07-03 DIAGNOSIS — E1165 Type 2 diabetes mellitus with hyperglycemia: Secondary | ICD-10-CM | POA: Diagnosis not present

## 2024-07-07 DIAGNOSIS — H25811 Combined forms of age-related cataract, right eye: Secondary | ICD-10-CM | POA: Diagnosis not present

## 2024-07-08 ENCOUNTER — Other Ambulatory Visit: Payer: Self-pay | Admitting: Cardiology

## 2024-07-10 DIAGNOSIS — E782 Mixed hyperlipidemia: Secondary | ICD-10-CM | POA: Diagnosis not present

## 2024-07-10 DIAGNOSIS — M545 Low back pain, unspecified: Secondary | ICD-10-CM | POA: Diagnosis not present

## 2024-07-10 DIAGNOSIS — T466X5D Adverse effect of antihyperlipidemic and antiarteriosclerotic drugs, subsequent encounter: Secondary | ICD-10-CM | POA: Diagnosis not present

## 2024-07-10 DIAGNOSIS — E1169 Type 2 diabetes mellitus with other specified complication: Secondary | ICD-10-CM | POA: Diagnosis not present

## 2024-07-10 DIAGNOSIS — G720 Drug-induced myopathy: Secondary | ICD-10-CM | POA: Diagnosis not present

## 2024-07-10 DIAGNOSIS — E1165 Type 2 diabetes mellitus with hyperglycemia: Secondary | ICD-10-CM | POA: Diagnosis not present

## 2024-07-10 DIAGNOSIS — N1831 Chronic kidney disease, stage 3a: Secondary | ICD-10-CM | POA: Diagnosis not present

## 2024-07-10 DIAGNOSIS — F32A Depression, unspecified: Secondary | ICD-10-CM | POA: Diagnosis not present

## 2024-07-10 DIAGNOSIS — E039 Hypothyroidism, unspecified: Secondary | ICD-10-CM | POA: Diagnosis not present

## 2024-07-10 DIAGNOSIS — E1122 Type 2 diabetes mellitus with diabetic chronic kidney disease: Secondary | ICD-10-CM | POA: Diagnosis not present

## 2024-07-10 DIAGNOSIS — G3184 Mild cognitive impairment, so stated: Secondary | ICD-10-CM | POA: Diagnosis not present

## 2024-07-10 DIAGNOSIS — I25810 Atherosclerosis of coronary artery bypass graft(s) without angina pectoris: Secondary | ICD-10-CM | POA: Diagnosis not present

## 2024-07-11 ENCOUNTER — Encounter (HOSPITAL_COMMUNITY)
Admission: RE | Admit: 2024-07-11 | Discharge: 2024-07-11 | Disposition: A | Discharge status: Home / Self Care | Payer: PRIVATE HEALTH INSURANCE | Source: Ambulatory Visit | Attending: Ophthalmology | Admitting: Ophthalmology

## 2024-07-11 ENCOUNTER — Encounter (HOSPITAL_COMMUNITY): Payer: Self-pay

## 2024-07-11 ENCOUNTER — Other Ambulatory Visit: Payer: Self-pay

## 2024-07-16 NOTE — H&P (Signed)
 Surgical History & Physical  Patient Name: Hunter Sparks  DOB: 29-Mar-1946  Surgery: Cataract extraction with intraocular lens implant phacoemulsification; Right Eye Surgeon: Lynwood Hermann MD Surgery Date: 07/18/2024 Pre-Op Date: 06/16/2024  HPI: A 21 Yr. old male patient  1. The patient is a new patient here for a Cataract Evaluation. The patient complains of difficulty when reading fine print, books, newspaper, instructions etc., which began 1 year ago. Both eyes are affected. The episode is constant. The patient describes foggy symptoms affecting their eyes/vision. This is negatively affecting the patient's quality of life and the patient is unable to function adequately in life with the current level of vision. HPI Completed by Dr. Lynwood Hermann  Medical History: Cataracts  Arthritis Diabetes Stroke  Review of Systems Cardiovascular diabetes Musculoskeletal arthritis Neurological Stroke All recorded systems are negative except as noted above.  Social Never smoked  Medication Prednisolone-moxiflox-bromfen,  Losartan , Levothyroxine , Prednisone, Toujeo  SoloStar U-300 Insulin , Metoprolol  succinate  Sx/Procedures None  Drug Allergies  penicillin   History & Physical: Heent: cataracts NECK: supple without bruits LUNGS: lungs clear to auscultation CV: regular rate and rhythm Abdomen: soft and non-tender  Impression & Plan: Assessment: 1.  COMBINED FORMS AGE RELATED CATARACT; Both Eyes (H25.813) 2.  BLEPHARITIS; Right Upper Lid, Right Lower Lid, Left Upper Lid, Left Lower Lid (H01.001, H01.002,H01.004,H01.005) 3.  OAG BORDERLINE FINDINGS LOW RISK; Both Eyes (H40.013) 4.  AMBLYOPIA REFRACTIVE; Left Eye (H53.022) 5.  Ocular Hypertension; Both Eyes (H40.053) 6.  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  Cataract accounts for the patient's decreased vision. This visual impairment is not correctable with a tolerable change in  glasses or contact lenses. Cataract surgery with an implantation of a new lens should significantly improve the visual and functional status of the patient. Discussed all risks, benefits, alternatives, and potential complications. Discussed the procedures and recovery. Patient desires to have surgery. A-scan ordered and performed today for intra-ocular lens calculations. The surgery will be performed in order to improve vision for driving, reading, and for eye examinations. Recommend phacoemulsification with intra-ocular lens. Recommend Dextenza  for post-operative pain and inflammation. History of refractive Surgery: None Use of Eye Pressure Lowering Drops: None Right eye - functional eye - first. Dilates poorly - shugarcaine or Lidocaine +Omidira by protocol  2.  Blepharitis is present - recommend regular lid cleaning.  3.  Based on cup-to-disc ratio. Negative Family history. OCT rNFL shows: WNL OU 06/16/24 IOP high normal OD, above normal OS. Will treat with cataract surgery and monitor.  4.  Will limit final vision.  5.  worse left eye. OCT rNFL WNL OU as above. Monitor.  6.  Likely based on appearance. Will refer to dermatology for eval and treatment.

## 2024-07-17 DIAGNOSIS — M47816 Spondylosis without myelopathy or radiculopathy, lumbar region: Secondary | ICD-10-CM | POA: Diagnosis not present

## 2024-07-18 ENCOUNTER — Ambulatory Visit (HOSPITAL_COMMUNITY): Admitting: Certified Registered"

## 2024-07-18 ENCOUNTER — Ambulatory Visit (HOSPITAL_COMMUNITY)
Admission: RE | Admit: 2024-07-18 | Discharge: 2024-07-18 | Disposition: A | Discharge status: Home / Self Care | Attending: Ophthalmology | Admitting: Ophthalmology

## 2024-07-18 ENCOUNTER — Encounter (HOSPITAL_COMMUNITY)
Admission: RE | Disposition: A | Discharge status: Home / Self Care | Payer: Self-pay | Source: Home / Self Care | Attending: Ophthalmology

## 2024-07-18 DIAGNOSIS — H40053 Ocular hypertension, bilateral: Secondary | ICD-10-CM | POA: Diagnosis not present

## 2024-07-18 DIAGNOSIS — C441121 Basal cell carcinoma of skin of right upper eyelid, including canthus: Secondary | ICD-10-CM | POA: Diagnosis not present

## 2024-07-18 DIAGNOSIS — I69354 Hemiplegia and hemiparesis following cerebral infarction affecting left non-dominant side: Secondary | ICD-10-CM | POA: Diagnosis not present

## 2024-07-18 DIAGNOSIS — H0100B Unspecified blepharitis left eye, upper and lower eyelids: Secondary | ICD-10-CM | POA: Diagnosis not present

## 2024-07-18 DIAGNOSIS — Z79899 Other long term (current) drug therapy: Secondary | ICD-10-CM | POA: Insufficient documentation

## 2024-07-18 DIAGNOSIS — H53022 Refractive amblyopia, left eye: Secondary | ICD-10-CM | POA: Diagnosis not present

## 2024-07-18 DIAGNOSIS — E039 Hypothyroidism, unspecified: Secondary | ICD-10-CM | POA: Diagnosis not present

## 2024-07-18 DIAGNOSIS — I10 Essential (primary) hypertension: Secondary | ICD-10-CM | POA: Insufficient documentation

## 2024-07-18 DIAGNOSIS — K219 Gastro-esophageal reflux disease without esophagitis: Secondary | ICD-10-CM | POA: Diagnosis not present

## 2024-07-18 DIAGNOSIS — Z794 Long term (current) use of insulin: Secondary | ICD-10-CM | POA: Insufficient documentation

## 2024-07-18 DIAGNOSIS — Z951 Presence of aortocoronary bypass graft: Secondary | ICD-10-CM | POA: Diagnosis not present

## 2024-07-18 DIAGNOSIS — Z87891 Personal history of nicotine dependence: Secondary | ICD-10-CM | POA: Insufficient documentation

## 2024-07-18 DIAGNOSIS — H40013 Open angle with borderline findings, low risk, bilateral: Secondary | ICD-10-CM | POA: Insufficient documentation

## 2024-07-18 DIAGNOSIS — H25811 Combined forms of age-related cataract, right eye: Secondary | ICD-10-CM | POA: Diagnosis not present

## 2024-07-18 DIAGNOSIS — H25813 Combined forms of age-related cataract, bilateral: Secondary | ICD-10-CM | POA: Insufficient documentation

## 2024-07-18 DIAGNOSIS — E1136 Type 2 diabetes mellitus with diabetic cataract: Secondary | ICD-10-CM | POA: Insufficient documentation

## 2024-07-18 DIAGNOSIS — H0100A Unspecified blepharitis right eye, upper and lower eyelids: Secondary | ICD-10-CM | POA: Diagnosis not present

## 2024-07-18 DIAGNOSIS — I2510 Atherosclerotic heart disease of native coronary artery without angina pectoris: Secondary | ICD-10-CM | POA: Diagnosis not present

## 2024-07-18 HISTORY — PX: CATARACT EXTRACTION W/PHACO: SHX586

## 2024-07-18 LAB — GLUCOSE, CAPILLARY: Glucose-Capillary: 164 mg/dL — ABNORMAL HIGH (ref 70–99)

## 2024-07-18 SURGERY — PHACOEMULSIFICATION, CATARACT, WITH IOL INSERTION
Anesthesia: Monitor Anesthesia Care | Site: Eye | Laterality: Right

## 2024-07-18 MED ORDER — PHENYLEPHRINE-KETOROLAC 1-0.3 % IO SOLN
INTRAOCULAR | Status: DC | PRN
  Administered 2024-07-18: 500 mL via OPHTHALMIC

## 2024-07-18 MED ORDER — LIDOCAINE HCL (PF) 1 % IJ SOLN
INTRAMUSCULAR | Status: DC | PRN
  Administered 2024-07-18: 1 mL

## 2024-07-18 MED ORDER — BSS IO SOLN
INTRAOCULAR | Status: DC | PRN
Start: 2024-07-18 — End: 2024-07-18
  Administered 2024-07-18: 15 mL via INTRAOCULAR

## 2024-07-18 MED ORDER — STERILE WATER FOR IRRIGATION IR SOLN
Status: DC | PRN
Start: 2024-07-18 — End: 2024-07-18
  Administered 2024-07-18: 1

## 2024-07-18 MED ORDER — PHENYLEPHRINE HCL 2.5 % OP SOLN
1.0000 [drp] | OPHTHALMIC | Status: AC | PRN
Start: 2024-07-18 — End: 2024-07-18
  Administered 2024-07-18 (×3): 1 [drp] via OPHTHALMIC

## 2024-07-18 MED ORDER — LIDOCAINE HCL 3.5 % OP GEL
1.0000 | Freq: Once | OPHTHALMIC | Status: AC
  Administered 2024-07-18: 1 via OPHTHALMIC

## 2024-07-18 MED ORDER — MOXIFLOXACIN HCL 5 MG/ML IO SOLN
INTRAOCULAR | Status: DC | PRN
Start: 2024-07-18 — End: 2024-07-18
  Administered 2024-07-18: .2 mL via INTRACAMERAL

## 2024-07-18 MED ORDER — TETRACAINE HCL 0.5 % OP SOLN
1.0000 [drp] | OPHTHALMIC | Status: AC | PRN
  Administered 2024-07-18 (×3): 1 [drp] via OPHTHALMIC

## 2024-07-18 MED ORDER — LACTATED RINGERS IV SOLN: INTRAVENOUS | Status: DC

## 2024-07-18 MED ORDER — MIDAZOLAM HCL 2 MG/2ML IJ SOLN
INTRAMUSCULAR | Status: AC
  Filled 2024-07-18: qty 2

## 2024-07-18 MED ORDER — POVIDONE-IODINE 5 % OP SOLN
OPHTHALMIC | Status: DC | PRN
  Administered 2024-07-18: 1 via OPHTHALMIC

## 2024-07-18 MED ORDER — SODIUM HYALURONATE 23MG/ML IO SOSY
PREFILLED_SYRINGE | INTRAOCULAR | Status: DC | PRN
  Administered 2024-07-18: .6 mL via INTRAOCULAR

## 2024-07-18 MED ORDER — TROPICAMIDE 1 % OP SOLN
1.0000 [drp] | OPHTHALMIC | Status: AC | PRN
Start: 2024-07-18 — End: 2024-07-18
  Administered 2024-07-18 (×3): 1 [drp] via OPHTHALMIC

## 2024-07-18 MED ORDER — SODIUM HYALURONATE 10 MG/ML IO SOLUTION
PREFILLED_SYRINGE | INTRAOCULAR | Status: DC | PRN
  Administered 2024-07-18: .85 mL via INTRAOCULAR

## 2024-07-18 SURGICAL SUPPLY — 11 items
CLOTH BEACON ORANGE TIMEOUT ST (SAFETY) ×2 IMPLANT
EYE SHIELD UNIVERSAL CLEAR (GAUZE/BANDAGES/DRESSINGS) IMPLANT
FEE CATARACT SUITE SIGHTPATH (MISCELLANEOUS) ×2 IMPLANT
GLOVE BIOGEL PI IND STRL 7.0 (GLOVE) ×4 IMPLANT
LENS IOL TECNIS EYHANCE 20.5 (Intraocular Lens) IMPLANT
NDL HYPO 18GX1.5 BLUNT FILL (NEEDLE) ×2 IMPLANT
NEEDLE HYPO 18GX1.5 BLUNT FILL (NEEDLE) ×1 IMPLANT
PAD ARMBOARD POSITIONER FOAM (MISCELLANEOUS) ×2 IMPLANT
SYR TB 1ML LL NO SAFETY (SYRINGE) ×2 IMPLANT
TAPE SURG TRANSPORE 1 IN (GAUZE/BANDAGES/DRESSINGS) IMPLANT
WATER STERILE IRR 250ML POUR (IV SOLUTION) ×2 IMPLANT

## 2024-07-18 NOTE — Anesthesia Postprocedure Evaluation (Signed)
 Anesthesia Post Note  Patient: Beryl LITTIE Sitter  Procedure(s) Performed: PHACOEMULSIFICATION, CATARACT, WITH IOL INSERTION (Right: Eye)  Patient location during evaluation: Phase II Anesthesia Type: MAC Level of consciousness: awake and alert Pain management: pain level controlled Vital Signs Assessment: post-procedure vital signs reviewed and stable Respiratory status: spontaneous breathing, nonlabored ventilation, respiratory function stable and patient connected to nasal cannula oxygen Cardiovascular status: stable and blood pressure returned to baseline Postop Assessment: no apparent nausea or vomiting Anesthetic complications: no   There were no known notable events for this encounter.   Last Vitals:  Vitals:   07/18/24 0800 07/18/24 0943  BP: (!) 172/69 (!) 170/76  Pulse: 63 (!) 59  Resp: 18 16  Temp: (!) 36.4 C 36.6 C  SpO2: 97% 100%    Last Pain:  Vitals:   07/18/24 0943  TempSrc: Oral  PainSc: 0-No pain                 Nola Botkins L Alphonsus Doyel

## 2024-07-18 NOTE — Anesthesia Procedure Notes (Signed)
 Date/Time: 07/18/2024 9:23 AM  Performed by: Para Jerelene CROME, CRNAOxygen Delivery Method: Nasal cannula

## 2024-07-18 NOTE — Interval H&P Note (Signed)
 History and Physical Interval Note:  07/18/2024 9:14 AM  Hunter Sparks Sitter  has presented today for surgery, with the diagnosis of combined forms age related cataract, right eye.  The various methods of treatment have been discussed with the patient and family. After consideration of risks, benefits and other options for treatment, the patient has consented to  Procedure(s): PHACOEMULSIFICATION, CATARACT, WITH IOL INSERTION (Right) as a surgical intervention.  The patient's history has been reviewed, patient examined, no change in status, stable for surgery.  I have reviewed the patient's chart and labs.  Questions were answered to the patient's satisfaction.     HARRIE AGENT

## 2024-07-18 NOTE — Transfer of Care (Addendum)
 Immediate Anesthesia Transfer of Care Note  Patient: Hunter Sparks  Procedure(s) Performed: PHACOEMULSIFICATION, CATARACT, WITH IOL INSERTION (Right: Eye)  Patient Location: Short Stay  Anesthesia Type:MAC  Level of Consciousness: awake and patient cooperative  Airway & Oxygen Therapy: Patient Spontanous Breathing  Post-op Assessment: Report given to RN and Post -op Vital signs reviewed and stable  Post vital signs: Reviewed and stable  Last Vitals:  Vitals Value Taken Time  BP 170/76 07/18/24   0943  Temp 36.6 07/18/24   0943  Pulse 60 07/18/24   0943  Resp 16 07/18/24   0943  SpO2 100% 07/18/24   0943    Last Pain:  Vitals:   07/18/24 0800  TempSrc: Oral  PainSc: 0-No pain      Patients Stated Pain Goal: 5 (07/18/24 0800)  Complications: No notable events documented.

## 2024-07-18 NOTE — Anesthesia Preprocedure Evaluation (Addendum)
 Anesthesia Evaluation  Patient identified by MRN, date of birth, ID band Patient awake    Reviewed: Allergy & Precautions, NPO status , Patient's Chart, lab work & pertinent test results, reviewed documented beta blocker date and time   History of Anesthesia Complications Negative for: history of anesthetic complications  Airway Mallampati: I  TM Distance: >3 FB Neck ROM: Full    Dental  (+) Edentulous Upper, Edentulous Lower   Pulmonary former smoker   Pulmonary exam normal breath sounds clear to auscultation       Cardiovascular hypertension, Pt. on medications and Pt. on home beta blockers (-) angina + CAD and + CABG  Normal cardiovascular exam Rhythm:Regular Rate:Normal  78/2024 ECHO: EF 70 to 75%.  1. The LV has hyperdynamic function, no regional wall motion abnormalities. Grade I diastolic dysfunction (impaired relaxation).   2. RVF was not well visualized. The right ventricular size is normal.   3. The mitral valve is grossly normal. No evidence of MR. No evidence of mitral stenosis.   4. The aortic valve was not well visualized. Aortic valve regurgitation is not visualized. Aortic valve sclerosis/calcification is present, without any evidence of aortic stenosis.     Neuro/Psych    Depression    CVA (L sided weakness), Residual Symptoms    GI/Hepatic Neg liver ROS,GERD  Medicated and Controlled,,  Endo/Other  diabetes, Type 2, Insulin  DependentHypothyroidism    Renal/GU Renal InsufficiencyRenal disease     Musculoskeletal  (+) Arthritis ,    Abdominal   Peds  Hematology Hb 12.9   Anesthesia Other Findings   Reproductive/Obstetrics                              Anesthesia Physical Anesthesia Plan  ASA: 3  Anesthesia Plan: MAC   Post-op Pain Management: Minimal or no pain anticipated   Induction: Intravenous  PONV Risk Score and Plan:   Airway Management Planned: Nasal  Cannula and Natural Airway  Additional Equipment: None  Intra-op Plan:   Post-operative Plan:   Informed Consent: I have reviewed the patients History and Physical, chart, labs and discussed the procedure including the risks, benefits and alternatives for the proposed anesthesia with the patient or authorized representative who has indicated his/her understanding and acceptance.       Plan Discussed with: CRNA  Anesthesia Plan Comments: (PAT note by Lynwood Hope, PA-C: 78 year old male with pertinent history including CAD s/p CABG x4 2016, HTN, HLD, IDDM2 (A1c 8.8 on 06/21/2023), former smoker (90 pack years, quit 2005), hypothyroid.  On 06/21/2023 patient presented to the ED via EMS for strokelike symptoms.  Patient presented with left-sided weakness and slurred speech that improved during transport per EMS.  BP 200/81.  Head CT was negative for acute intracranial abnormalities.  MRI/MRA brain/head demonstrated scattered small acute to early subacute acute infarcts right cerebral hemisphere involving the basal ganglia and frontal, parietal, and occipital cortex in the MCA and MCA/PCA watershed distributions.  Echo showed normal LV function, Grade I DD, no significant valvular abnormalities, negative bubble study.  Carotid ultrasound revealed mild carotid artery stenosis.  He was placed on DAPT x 3 weeks then aspirin  alone by neurology.  Discharged on 06/23/2023 to inpatient rehab.    On 06/29/2023 patient developed abdominal pain which was initially attributed to constipation but did not resolve with laxatives.  Pain worsened and was associated with nausea and vomiting.  Patient was found to have acute cholecystitis and  was admitted to medical services for IV Rocephin  and Flagyl .  Percutaneous cholecystostomy drain placed with plan to remain in place for 6 weeks.  Hospital stay complicated by delirium and poor p.o. intake.  Patient was discharged back to inpatient rehab on 07/14/2023.  Patient was  discharged home from inpatient rehab on 08/04/2023.  Seen by cardiology APP Barnie Hila, NP on 08/30/2023 for preop eval.  Per note, Preoperative cardiovascular risk assessment  Laparoscopic cholecystectomy by Dr. Rubin. According to the RCRI, patient has a 6.6% risk of MACE. Patient reports activity equivalent to >4.0 METS (personal hygiene, independent with transfers from wheelchair to bed, working with PT/OT). -Based on ACC/AHA guidelines, MATEI MAGNONE would be at acceptable risk for the planned procedure without further cardiovascular testing.  -Ideally aspirin  should be continued without interruption, however if the bleeding risk is too great, aspirin  may be held for 5-7 days prior to surgery. Please resume aspirin  post operatively when it is felt to be safe from a bleeding standpoint.  Patient is also on aspirin  secondary to recent CVA August 2024 and is being followed by neurology.  Patient was also cleared by PCP Bobetta Creed, NP and note dated 09/05/2023 stating, I am writing on behalf of Estell Dillinger to inform your facility the patient is medically cleared for laparoscopic cholecystectomy under general anesthesia.  I received a call from [facility] stating the patient is having complications with his percutaneous cholecystostomy drain and wants to move forward with cholecystectomy as soon as possible.  He has been seen and cleared by cardiology as well as his PCP.  He has not yet been seen in outpatient follow-up by neurology, this is scheduled for 10/25/2023.  I discussed his case with anesthesiologist Dr. Keneth.  He advised that since patient has been stable since discharge and has been evaluated by cardiology as well as PCP, he can proceed as planned barring acute status change.  He will need day of surgery labs and evaluation.  EKG 08/30/2023: Normal sinus rhythm with sinus arrhythmia. Rate 65. Minimal voltage criteria for LVH, may be normal variant  TTE  06/22/2023: 1. Left ventricular ejection fraction, by estimation, is 70 to 75%. The  left ventricle has hyperdynamic function. The left ventricle has no  regional wall motion abnormalities. Left ventricular diastolic parameters  are consistent with Grade I diastolic  dysfunction (impaired relaxation).  2. Right ventricular systolic function was not well visualized. The right  ventricular size is normal.  3. The mitral valve is grossly normal. No evidence of mitral valve  regurgitation. No evidence of mitral stenosis.  4. The aortic valve was not well visualized. Aortic valve regurgitation  is not visualized. Aortic valve sclerosis/calcification is present,  without any evidence of aortic stenosis.  5. Agitated saline contrast bubble study was negative, with no evidence  of any interatrial shunt.   )         Anesthesia Quick Evaluation

## 2024-07-18 NOTE — Op Note (Signed)
 Date of procedure: 07/18/24  Pre-operative diagnosis:  Visually significant combined form age-related cataract, Right Eye (H25.811)  Post-operative diagnosis:  Visually significant combined form age-related cataract, Right Eye (H25.811)  Procedure: Removal of cataract via phacoemulsification and insertion of intra-ocular lens Vicci and Johnson DIB00 +20.5D into the capsular bag of the Right Eye  Attending surgeon: Lynwood LABOR. Asuka Dusseau, MD, MA  Anesthesia: MAC, Topical Akten   Complications: None  Estimated Blood Loss: <79mL (minimal)  Specimens: None  Implants: As above  Indications:  Visually significant age-related cataract, Right Eye  Procedure:  The patient was seen and identified in the pre-operative area. The operative eye was identified and dilated.  The operative eye was marked.  Topical anesthesia was administered to the operative eye.     The patient was then to the operative suite and placed in the supine position.  A timeout was performed confirming the patient, procedure to be performed, and all other relevant information.   The patient's face was prepped and draped in the usual fashion for intra-ocular surgery.  A lid speculum was placed into the operative eye and the surgical microscope moved into place and focused.  A superotemporal paracentesis was created using a 20 gauge paracentesis blade. Omidria  was injected into the anterior chamber. Shugarcaine was injected into the anterior chamber.  Viscoelastic was injected into the anterior chamber.  A temporal clear-corneal main wound incision was created using a 2.66mm microkeratome.  A continuous curvilinear capsulorrhexis was initiated using an irrigating cystitome and completed using capsulorrhexis forceps.  Hydrodissection and hydrodeliniation were performed.  Viscoelastic was injected into the anterior chamber.  A phacoemulsification handpiece and a chopper as a second instrument were used to remove the nucleus and epinucleus.  The irrigation/aspiration handpiece was used to remove any remaining cortical material.   The capsular bag was reinflated with viscoelastic, checked, and found to be intact.  The intraocular lens was inserted into the capsular bag.  The irrigation/aspiration handpiece was used to remove any remaining viscoelastic.  The clear corneal wound and paracentesis wounds were then hydrated and checked with Weck-Cels to be watertight. 0.1mL of Moxfloxacin was injected into the anterior chamber. The lid-speculum was removed.  The drape was removed.  The patient's face was cleaned with a wet and dry 4x4. A clear shield was taped over the eye. The patient was taken to the post-operative care unit in good condition, having tolerated the procedure well.  Post-Op Instructions: The patient will follow up at [facility] for a same day post-operative evaluation and will receive all other orders and instructions.

## 2024-07-18 NOTE — Discharge Instructions (Addendum)
 Please discharge patient when stable, will follow up today with Dr. June Leap at the [facility] office immediately following discharge.  Leave shield in place until visit.  All paperwork with discharge instructions will be given at the office.  [facility] Address:  [address]  [place], [place] [address]

## 2024-07-21 ENCOUNTER — Encounter (HOSPITAL_COMMUNITY): Payer: Self-pay | Admitting: Ophthalmology

## 2024-07-25 ENCOUNTER — Encounter (HOSPITAL_COMMUNITY)
Admission: RE | Admit: 2024-07-25 | Discharge: 2024-07-25 | Disposition: A | Discharge status: Home / Self Care | Payer: PRIVATE HEALTH INSURANCE | Source: Ambulatory Visit | Attending: Ophthalmology | Admitting: Ophthalmology

## 2024-07-28 DIAGNOSIS — H25812 Combined forms of age-related cataract, left eye: Secondary | ICD-10-CM | POA: Diagnosis not present

## 2024-07-30 DIAGNOSIS — C441121 Basal cell carcinoma of skin of right upper eyelid, including canthus: Secondary | ICD-10-CM | POA: Diagnosis not present

## 2024-07-30 NOTE — H&P (Signed)
 Surgical History & Physical  Patient Name: Hunter Sparks  DOB: Mar 22, 1946  Surgery: Cataract extraction with intraocular lens implant phacoemulsification; Left Eye Surgeon: Lynwood Hermann MD Surgery Date: 08/01/2024 Pre-Op Date: 07/24/2024  HPI: A 69 Yr. old male patient 1.  The patient is returning for a cataract follow-up of the right eye. Since the last visit, the affected area is doing well. The patient's vision is improved. The condition's severity is constant. Patient is following medication instructions. Pt.is ready to proceed with cataract surgery, due to blurred vision, which is negatively affecting the patient's quality of life and the patient is unable to function adequately in life with the current level of vision. HPI was performed by Lynwood Hermann .  Medical History: Cataracts  Arthritis Diabetes Stroke  Review of Systems Cardiovascular diabetes Musculoskeletal arthritis Neurological Stroke All recorded systems are negative except as noted above.  Social Never smoked  Medication Prednisolone-moxiflox-bromfen, Prednisolone-moxiflox-bromfen,  Losartan , Levothyroxine , Prednisone, Toujeo  SoloStar U-300 Insulin , Metoprolol  succinate  Sx/Procedures Phaco c IOL OD  Drug Allergies  penicillin   History & Physical: Heent: cataract NECK: supple without bruits LUNGS: lungs clear to auscultation CV: regular rate and rhythm Abdomen: soft and non-tender  Impression & Plan: Assessment: 1.  CATARACT EXTRACTION STATUS; Right Eye (Z98.41) 2.  COMBINED FORMS AGE RELATED CATARACT; Both Eyes (H25.813) 3.  NUCLEAR SCLEROSIS AGE RELATED; Left Eye (H25.12)  Plan: 1.  1 week after cataract surgery. Doing well with improved vision and normal eye pressure. Call with any problems or concerns. Continue Pred-Moxi-Brom 2x/day for 3 more weeks.  2.  Cataract accounts for the patient's decreased vision. This visual impairment is not correctable with a tolerable change in glasses or  contact lenses. Cataract surgery with an implantation of a new lens should significantly improve the visual and functional status of the patient. Discussed all risks, benefits, alternatives, and potential complications. Discussed the procedures and recovery. Patient desires to have surgery. A-scan ordered and performed today for intra-ocular lens calculations. The surgery will be performed in order to improve vision for driving, reading, and for eye examinations. Recommend phacoemulsification with intra-ocular lens. Recommend Dextenza  for post-operative pain and inflammation. History of refractive Surgery: None Use of Eye Pressure Lowering Drops: None Left Eye. Dilates poorly - shugarcaine or Lidocaine +Omidira by protocol  3.  See above

## 2024-07-31 DIAGNOSIS — I639 Cerebral infarction, unspecified: Secondary | ICD-10-CM | POA: Diagnosis not present

## 2024-08-01 ENCOUNTER — Ambulatory Visit (HOSPITAL_COMMUNITY)
Admission: RE | Admit: 2024-08-01 | Discharge: 2024-08-01 | Disposition: A | Discharge status: Home / Self Care | Payer: PRIVATE HEALTH INSURANCE | Attending: Ophthalmology | Admitting: Ophthalmology

## 2024-08-01 ENCOUNTER — Ambulatory Visit (HOSPITAL_BASED_OUTPATIENT_CLINIC_OR_DEPARTMENT_OTHER): Admitting: Anesthesiology

## 2024-08-01 ENCOUNTER — Encounter (HOSPITAL_COMMUNITY)
Admission: RE | Disposition: A | Discharge status: Home / Self Care | Payer: Self-pay | Source: Home / Self Care | Attending: Ophthalmology

## 2024-08-01 ENCOUNTER — Encounter (HOSPITAL_COMMUNITY): Payer: Self-pay | Admitting: Ophthalmology

## 2024-08-01 ENCOUNTER — Ambulatory Visit (HOSPITAL_COMMUNITY): Admitting: Anesthesiology

## 2024-08-01 DIAGNOSIS — H25812 Combined forms of age-related cataract, left eye: Secondary | ICD-10-CM | POA: Diagnosis not present

## 2024-08-01 DIAGNOSIS — Z9841 Cataract extraction status, right eye: Secondary | ICD-10-CM | POA: Insufficient documentation

## 2024-08-01 DIAGNOSIS — E039 Hypothyroidism, unspecified: Secondary | ICD-10-CM | POA: Insufficient documentation

## 2024-08-01 DIAGNOSIS — Z794 Long term (current) use of insulin: Secondary | ICD-10-CM | POA: Insufficient documentation

## 2024-08-01 DIAGNOSIS — Z8673 Personal history of transient ischemic attack (TIA), and cerebral infarction without residual deficits: Secondary | ICD-10-CM | POA: Insufficient documentation

## 2024-08-01 DIAGNOSIS — N1831 Chronic kidney disease, stage 3a: Secondary | ICD-10-CM

## 2024-08-01 DIAGNOSIS — I25110 Atherosclerotic heart disease of native coronary artery with unstable angina pectoris: Secondary | ICD-10-CM | POA: Diagnosis not present

## 2024-08-01 DIAGNOSIS — I10 Essential (primary) hypertension: Secondary | ICD-10-CM | POA: Insufficient documentation

## 2024-08-01 DIAGNOSIS — I129 Hypertensive chronic kidney disease with stage 1 through stage 4 chronic kidney disease, or unspecified chronic kidney disease: Secondary | ICD-10-CM

## 2024-08-01 DIAGNOSIS — E1136 Type 2 diabetes mellitus with diabetic cataract: Secondary | ICD-10-CM | POA: Diagnosis not present

## 2024-08-01 DIAGNOSIS — I25119 Atherosclerotic heart disease of native coronary artery with unspecified angina pectoris: Secondary | ICD-10-CM | POA: Insufficient documentation

## 2024-08-01 HISTORY — PX: CATARACT EXTRACTION W/PHACO: SHX586

## 2024-08-01 LAB — GLUCOSE, CAPILLARY: Glucose-Capillary: 159 mg/dL — ABNORMAL HIGH (ref 70–99)

## 2024-08-01 SURGERY — PHACOEMULSIFICATION, CATARACT, WITH IOL INSERTION
Anesthesia: Monitor Anesthesia Care | Site: Eye | Laterality: Left

## 2024-08-01 MED ORDER — MOXIFLOXACIN HCL 5 MG/ML IO SOLN
INTRAOCULAR | Status: DC | PRN
  Administered 2024-08-01: .2 mL via INTRACAMERAL

## 2024-08-01 MED ORDER — LIDOCAINE HCL (PF) 1 % IJ SOLN
INTRAMUSCULAR | Status: DC | PRN
  Administered 2024-08-01: 1 mL

## 2024-08-01 MED ORDER — PHENYLEPHRINE HCL 2.5 % OP SOLN
1.0000 [drp] | OPHTHALMIC | Status: AC | PRN
  Administered 2024-08-01 (×3): 1 [drp] via OPHTHALMIC

## 2024-08-01 MED ORDER — TROPICAMIDE 1 % OP SOLN
1.0000 [drp] | OPHTHALMIC | Status: AC | PRN
  Administered 2024-08-01 (×3): 1 [drp] via OPHTHALMIC

## 2024-08-01 MED ORDER — POVIDONE-IODINE 5 % OP SOLN
OPHTHALMIC | Status: DC | PRN
  Administered 2024-08-01: 1 via OPHTHALMIC

## 2024-08-01 MED ORDER — BSS IO SOLN
INTRAOCULAR | Status: DC | PRN
  Administered 2024-08-01: 15 mL via INTRAOCULAR

## 2024-08-01 MED ORDER — LIDOCAINE HCL 3.5 % OP GEL
1.0000 | Freq: Once | OPHTHALMIC | Status: AC
  Administered 2024-08-01: 1 via OPHTHALMIC

## 2024-08-01 MED ORDER — PHENYLEPHRINE-KETOROLAC 1-0.3 % IO SOLN
INTRAOCULAR | Status: DC | PRN
  Administered 2024-08-01: 500 mL via OPHTHALMIC

## 2024-08-01 MED ORDER — TETRACAINE HCL 0.5 % OP SOLN
1.0000 [drp] | OPHTHALMIC | Status: AC | PRN
  Administered 2024-08-01 (×3): 1 [drp] via OPHTHALMIC

## 2024-08-01 MED ORDER — SODIUM HYALURONATE 23MG/ML IO SOSY
PREFILLED_SYRINGE | INTRAOCULAR | Status: DC | PRN
  Administered 2024-08-01: .6 mL via INTRAOCULAR

## 2024-08-01 MED ORDER — LACTATED RINGERS IV SOLN: INTRAVENOUS | Status: DC

## 2024-08-01 MED ORDER — SODIUM HYALURONATE 10 MG/ML IO SOLUTION
PREFILLED_SYRINGE | INTRAOCULAR | Status: DC | PRN
  Administered 2024-08-01: .85 mL via INTRAOCULAR

## 2024-08-01 MED ORDER — STERILE WATER FOR IRRIGATION IR SOLN
Status: DC | PRN
  Administered 2024-08-01: 1

## 2024-08-01 SURGICAL SUPPLY — 11 items
CLOTH BEACON ORANGE TIMEOUT ST (SAFETY) ×2 IMPLANT
EYE SHIELD UNIVERSAL CLEAR (GAUZE/BANDAGES/DRESSINGS) IMPLANT
FEE CATARACT SUITE SIGHTPATH (MISCELLANEOUS) ×2 IMPLANT
GLOVE BIOGEL PI IND STRL 7.0 (GLOVE) ×4 IMPLANT
LENS IOL TECNIS EYHANCE 22.0 (Intraocular Lens) IMPLANT
NDL HYPO 18GX1.5 BLUNT FILL (NEEDLE) ×2 IMPLANT
NEEDLE HYPO 18GX1.5 BLUNT FILL (NEEDLE) ×1 IMPLANT
PAD ARMBOARD POSITIONER FOAM (MISCELLANEOUS) ×2 IMPLANT
SYR TB 1ML LL NO SAFETY (SYRINGE) ×2 IMPLANT
TAPE SURG TRANSPORE 1 IN (GAUZE/BANDAGES/DRESSINGS) IMPLANT
WATER STERILE IRR 250ML POUR (IV SOLUTION) ×2 IMPLANT

## 2024-08-01 NOTE — Interval H&P Note (Signed)
 History and Physical Interval Note:  08/01/2024 10:32 AM  Hunter Sparks Sitter  has presented today for surgery, with the diagnosis of combined forms age related cataract, left eye.  The various methods of treatment have been discussed with the patient and family. After consideration of risks, benefits and other options for treatment, the patient has consented to  Procedure(s) with comments: PHACOEMULSIFICATION, CATARACT, WITH IOL INSERTION (Left) - CDE: as a surgical intervention.  The patient's history has been reviewed, patient examined, no change in status, stable for surgery.  I have reviewed the patient's chart and labs.  Questions were answered to the patient's satisfaction.     HARRIE AGENT

## 2024-08-01 NOTE — Transfer of Care (Signed)
 Immediate Anesthesia Transfer of Care Note  Patient: Beryl LITTIE Sitter  Procedure(s) Performed: PHACOEMULSIFICATION, CATARACT, WITH IOL INSERTION (Left: Eye)  Patient Location: Short Stay  Anesthesia Type:MAC  Level of Consciousness: awake and alert   Airway & Oxygen Therapy: Patient Spontanous Breathing  Post-op Assessment: Report given to RN and Post -op Vital signs reviewed and stable  Post vital signs: Reviewed and stable  Last Vitals:  Vitals Value Taken Time  BP 185/87 08/01/24 10:55  Temp 36.8 C 08/01/24 10:55  Pulse 56 08/01/24 10:55  Resp 16 08/01/24 10:55  SpO2 98 % 08/01/24 10:55    Last Pain:  Vitals:   08/01/24 1055  TempSrc: Oral  PainSc: 0-No pain      Patients Stated Pain Goal: 5 (08/01/24 0940)  Complications: No notable events documented.

## 2024-08-01 NOTE — Discharge Instructions (Signed)
 Please discharge patient when stable, will follow up today with Dr. June Leap at the [facility] office immediately following discharge.  Leave shield in place until visit.  All paperwork with discharge instructions will be given at the office.  [facility] Address:  [address]  [place], [place] [address]

## 2024-08-01 NOTE — Anesthesia Preprocedure Evaluation (Signed)
 Anesthesia Evaluation  Patient identified by MRN, date of birth, ID band Patient awake    Reviewed: Allergy & Precautions, NPO status   Airway Mallampati: I  TM Distance: >3 FB     Dental   Pulmonary former smoker   Pulmonary exam normal        Cardiovascular hypertension, + angina  + CAD  Normal cardiovascular exam     Neuro/Psych  PSYCHIATRIC DISORDERS  Depression   Dementia CVA    GI/Hepatic   Endo/Other  diabetesHypothyroidism    Renal/GU Renal disease     Musculoskeletal   Abdominal   Peds  Hematology   Anesthesia Other Findings   Reproductive/Obstetrics                              Anesthesia Physical Anesthesia Plan  ASA: 3  Anesthesia Plan: MAC   Post-op Pain Management:    Induction:   PONV Risk Score and Plan:   Airway Management Planned:   Additional Equipment:   Intra-op Plan:   Post-operative Plan:   Informed Consent:   Plan Discussed with:   Anesthesia Plan Comments:         Anesthesia Quick Evaluation

## 2024-08-01 NOTE — Op Note (Signed)
 Date of procedure: 08/01/24  Pre-operative diagnosis: Visually significant age-related combined cataract, Left Eye (H25.812)  Post-operative diagnosis: Visually significant age-related combined cataract, Left Eye (H25.812)  Procedure: Removal of cataract via phacoemulsification and insertion of intra-ocular lens Johnson and Johnson DIB00 +22.0D into the capsular bag of the Left Eye  Attending surgeon: Lynwood LABOR. Carlosdaniel Grob, MD, MA  Anesthesia: MAC, Topical Akten   Complications: None  Estimated Blood Loss: <52mL (minimal)  Specimens: None  Implants: As above  Indications:  Visually significant age-related cataract, Left Eye  Procedure:  The patient was seen and identified in the pre-operative area. The operative eye was identified and dilated.  The operative eye was marked.  Topical anesthesia was administered to the operative eye.     The patient was then to the operative suite and placed in the supine position.  A timeout was performed confirming the patient, procedure to be performed, and all other relevant information.   The patient's face was prepped and draped in the usual fashion for intra-ocular surgery.  A lid speculum was placed into the operative eye and the surgical microscope moved into place and focused.  An inferotemporal paracentesis was created using a 20 gauge paracentesis blade. Omidria  was injected into the anterior chamber. Shugarcaine was injected into the anterior chamber.  Viscoelastic was injected into the anterior chamber.  A temporal clear-corneal main wound incision was created using a 2.33mm microkeratome.  A continuous curvilinear capsulorrhexis was initiated using an irrigating cystitome and completed using capsulorrhexis forceps.  Hydrodissection and hydrodeliniation were performed.  Viscoelastic was injected into the anterior chamber.  A phacoemulsification handpiece and a chopper as a second instrument were used to remove the nucleus and epinucleus. The  irrigation/aspiration handpiece was used to remove any remaining cortical material.   The capsular bag was reinflated with viscoelastic, checked, and found to be intact.  The intraocular lens was inserted into the capsular bag.  The irrigation/aspiration handpiece was used to remove any remaining viscoelastic.  The clear corneal wound and paracentesis wounds were then hydrated and checked with Weck-Cels to be watertight. 0.1mL of Moxfloxacin was injected into the anterior chamber. The lid-speculum was removed.  The drape was removed.  The patient's face was cleaned with a wet and dry 4x4.    A clear shield was taped over the eye. The patient was taken to the post-operative care unit in good condition, having tolerated the procedure well.  Post-Op Instructions: The patient will follow up at [facility] for a same day post-operative evaluation and will receive all other orders and instructions.

## 2024-08-04 ENCOUNTER — Encounter (HOSPITAL_COMMUNITY): Payer: Self-pay | Admitting: Ophthalmology

## 2024-08-04 NOTE — Anesthesia Postprocedure Evaluation (Signed)
 Anesthesia Post Note  Patient: Beryl LITTIE Sitter  Procedure(s) Performed: PHACOEMULSIFICATION, CATARACT, WITH IOL INSERTION (Left: Eye)  Patient location during evaluation: Phase II Anesthesia Type: MAC Level of consciousness: awake Pain management: pain level controlled Vital Signs Assessment: post-procedure vital signs reviewed and stable Respiratory status: spontaneous breathing and respiratory function stable Cardiovascular status: blood pressure returned to baseline and stable Postop Assessment: no headache and no apparent nausea or vomiting Anesthetic complications: no Comments: Late entry   No notable events documented.   Last Vitals:  Vitals:   08/01/24 0940 08/01/24 1055  BP: (!) 162/68 (!) 185/87  Pulse: 67 (!) 56  Resp: 16 16  Temp: 36.8 C 36.8 C  SpO2: 98% 98%    Last Pain:  Vitals:   08/01/24 1055  TempSrc: Oral  PainSc: 0-No pain                 Yvonna JINNY Bosworth

## 2024-08-05 DIAGNOSIS — M545 Low back pain, unspecified: Secondary | ICD-10-CM | POA: Diagnosis not present

## 2024-08-20 ENCOUNTER — Encounter (HOSPITAL_COMMUNITY): Payer: Self-pay | Admitting: Interventional Radiology
